# Patient Record
Sex: Female | Born: 1960
Health system: Southern US, Community
[De-identification: ages and names within clinical notes are randomized; demographics above are authoritative.]

## PROBLEM LIST (undated history)

## (undated) ENCOUNTER — Emergency Department (HOSPITAL_COMMUNITY): Admission: EM | Discharge status: Absent without leave | Payer: BC Managed Care – PPO

## (undated) DIAGNOSIS — K219 Gastro-esophageal reflux disease without esophagitis: Secondary | ICD-10-CM

## (undated) DIAGNOSIS — E539 Vitamin B deficiency, unspecified: Secondary | ICD-10-CM

## (undated) DIAGNOSIS — Z9289 Personal history of other medical treatment: Secondary | ICD-10-CM

## (undated) DIAGNOSIS — F3289 Other specified depressive episodes: Secondary | ICD-10-CM

## (undated) DIAGNOSIS — J301 Allergic rhinitis due to pollen: Secondary | ICD-10-CM

## (undated) DIAGNOSIS — R569 Unspecified convulsions: Secondary | ICD-10-CM

## (undated) DIAGNOSIS — IMO0001 Reserved for inherently not codable concepts without codable children: Secondary | ICD-10-CM

## (undated) DIAGNOSIS — M1990 Unspecified osteoarthritis, unspecified site: Secondary | ICD-10-CM

## (undated) DIAGNOSIS — K8020 Calculus of gallbladder without cholecystitis without obstruction: Secondary | ICD-10-CM

## (undated) DIAGNOSIS — J40 Bronchitis, not specified as acute or chronic: Secondary | ICD-10-CM

## (undated) DIAGNOSIS — R51 Headache: Secondary | ICD-10-CM

## (undated) DIAGNOSIS — J189 Pneumonia, unspecified organism: Secondary | ICD-10-CM

## (undated) DIAGNOSIS — I214 Non-ST elevation (NSTEMI) myocardial infarction: Secondary | ICD-10-CM

## (undated) DIAGNOSIS — R002 Palpitations: Secondary | ICD-10-CM

## (undated) DIAGNOSIS — K5090 Crohn's disease, unspecified, without complications: Secondary | ICD-10-CM

## (undated) DIAGNOSIS — I519 Heart disease, unspecified: Secondary | ICD-10-CM

## (undated) DIAGNOSIS — M797 Fibromyalgia: Secondary | ICD-10-CM

## (undated) DIAGNOSIS — F329 Major depressive disorder, single episode, unspecified: Secondary | ICD-10-CM

## (undated) DIAGNOSIS — E785 Hyperlipidemia, unspecified: Secondary | ICD-10-CM

## (undated) DIAGNOSIS — R519 Headache, unspecified: Secondary | ICD-10-CM

## (undated) DIAGNOSIS — T7840XA Allergy, unspecified, initial encounter: Secondary | ICD-10-CM

## (undated) DIAGNOSIS — I5189 Other ill-defined heart diseases: Secondary | ICD-10-CM

## (undated) DIAGNOSIS — I10 Essential (primary) hypertension: Secondary | ICD-10-CM

## (undated) DIAGNOSIS — Z95820 Peripheral vascular angioplasty status with implants and grafts: Secondary | ICD-10-CM

## (undated) DIAGNOSIS — F411 Generalized anxiety disorder: Secondary | ICD-10-CM

## (undated) DIAGNOSIS — I2510 Atherosclerotic heart disease of native coronary artery without angina pectoris: Secondary | ICD-10-CM

## (undated) DIAGNOSIS — E119 Type 2 diabetes mellitus without complications: Secondary | ICD-10-CM

## (undated) HISTORY — DX: Allergy, unspecified, initial encounter: T78.40XA

## (undated) HISTORY — PX: TONSILLECTOMY AND ADENOIDECTOMY: SUR1326

## (undated) HISTORY — DX: Atherosclerotic heart disease of native coronary artery without angina pectoris: I25.10

## (undated) HISTORY — DX: Hyperlipidemia, unspecified: E78.5

## (undated) HISTORY — DX: Major depressive disorder, single episode, unspecified: F32.9

## (undated) HISTORY — DX: Allergic rhinitis due to pollen: J30.1

## (undated) HISTORY — DX: Vitamin B deficiency, unspecified: E53.9

## (undated) HISTORY — DX: Gastro-esophageal reflux disease without esophagitis: K21.9

## (undated) HISTORY — DX: Essential (primary) hypertension: I10

## (undated) HISTORY — DX: Fibromyalgia: M79.7

## (undated) HISTORY — DX: Reserved for inherently not codable concepts without codable children: IMO0001

## (undated) HISTORY — DX: Type 2 diabetes mellitus without complications: E11.9

## (undated) HISTORY — PX: TUBAL LIGATION: SHX77

## (undated) HISTORY — PX: EXPLORATORY LAPAROTOMY: SUR591

## (undated) HISTORY — DX: Unspecified convulsions: R56.9

## (undated) HISTORY — PX: ESOPHAGOGASTRODUODENOSCOPY: SHX1529

## (undated) HISTORY — DX: Crohn's disease, unspecified, without complications: K50.90

## (undated) HISTORY — DX: Personal history of other medical treatment: Z92.89

## (undated) HISTORY — DX: Calculus of gallbladder without cholecystitis without obstruction: K80.20

## (undated) HISTORY — DX: Generalized anxiety disorder: F41.1

## (undated) HISTORY — DX: Palpitations: R00.2

## (undated) HISTORY — DX: Other specified depressive episodes: F32.89

---
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
## 1998-03-06 ENCOUNTER — Encounter: Payer: Self-pay | Admitting: Internal Medicine

## 1998-03-06 ENCOUNTER — Observation Stay (HOSPITAL_COMMUNITY): Admission: EM | Admit: 1998-03-06 | Discharge: 1998-03-07 | Payer: Self-pay | Admitting: Emergency Medicine

## 1999-01-27 ENCOUNTER — Ambulatory Visit (HOSPITAL_COMMUNITY): Admission: RE | Admit: 1999-01-27 | Discharge: 1999-01-27 | Payer: Self-pay | Admitting: Internal Medicine

## 1999-01-27 ENCOUNTER — Encounter: Payer: Self-pay | Admitting: Internal Medicine

## 1999-07-02 ENCOUNTER — Other Ambulatory Visit: Admission: RE | Admit: 1999-07-02 | Discharge: 1999-07-02 | Payer: Self-pay | Admitting: Internal Medicine

## 1999-07-02 ENCOUNTER — Encounter: Payer: Self-pay | Admitting: Internal Medicine

## 1999-08-27 ENCOUNTER — Other Ambulatory Visit: Admission: RE | Admit: 1999-08-27 | Discharge: 1999-08-27 | Payer: Self-pay | Admitting: Internal Medicine

## 1999-08-27 ENCOUNTER — Encounter (INDEPENDENT_AMBULATORY_CARE_PROVIDER_SITE_OTHER): Payer: Self-pay | Admitting: Specialist

## 1999-10-20 ENCOUNTER — Other Ambulatory Visit: Admission: RE | Admit: 1999-10-20 | Discharge: 1999-10-20 | Payer: Self-pay | Admitting: Obstetrics and Gynecology

## 1999-11-12 ENCOUNTER — Encounter (HOSPITAL_COMMUNITY): Admission: RE | Admit: 1999-11-12 | Discharge: 2000-02-10 | Payer: Self-pay | Admitting: Internal Medicine

## 1999-11-29 ENCOUNTER — Emergency Department (HOSPITAL_COMMUNITY): Admission: EM | Admit: 1999-11-29 | Discharge: 1999-11-29 | Payer: Self-pay | Admitting: *Deleted

## 2000-02-18 ENCOUNTER — Other Ambulatory Visit: Admission: RE | Admit: 2000-02-18 | Discharge: 2000-02-18 | Payer: Self-pay | Admitting: Obstetrics and Gynecology

## 2000-03-03 ENCOUNTER — Encounter (HOSPITAL_COMMUNITY): Admission: RE | Admit: 2000-03-03 | Discharge: 2000-06-01 | Payer: Self-pay | Admitting: Internal Medicine

## 2000-10-30 ENCOUNTER — Ambulatory Visit (HOSPITAL_BASED_OUTPATIENT_CLINIC_OR_DEPARTMENT_OTHER): Admission: RE | Admit: 2000-10-30 | Discharge: 2000-10-30 | Payer: Self-pay | Admitting: Family Medicine

## 2001-08-17 ENCOUNTER — Other Ambulatory Visit: Admission: RE | Admit: 2001-08-17 | Discharge: 2001-08-17 | Payer: Self-pay | Admitting: Obstetrics and Gynecology

## 2002-09-19 ENCOUNTER — Other Ambulatory Visit: Admission: RE | Admit: 2002-09-19 | Discharge: 2002-09-19 | Payer: Self-pay | Admitting: Obstetrics and Gynecology

## 2003-10-22 ENCOUNTER — Other Ambulatory Visit: Admission: RE | Admit: 2003-10-22 | Discharge: 2003-10-22 | Payer: Self-pay | Admitting: Obstetrics and Gynecology

## 2004-06-24 ENCOUNTER — Ambulatory Visit (HOSPITAL_COMMUNITY): Admission: RE | Admit: 2004-06-24 | Discharge: 2004-06-24 | Payer: Self-pay | Admitting: *Deleted

## 2004-07-02 ENCOUNTER — Ambulatory Visit: Payer: Self-pay

## 2004-07-23 ENCOUNTER — Ambulatory Visit: Payer: Self-pay | Admitting: Internal Medicine

## 2004-08-03 ENCOUNTER — Ambulatory Visit: Payer: Self-pay | Admitting: Internal Medicine

## 2004-08-14 ENCOUNTER — Ambulatory Visit: Payer: Self-pay | Admitting: Internal Medicine

## 2004-08-20 ENCOUNTER — Ambulatory Visit: Payer: Self-pay | Admitting: Internal Medicine

## 2004-08-25 ENCOUNTER — Ambulatory Visit: Payer: Self-pay | Admitting: Internal Medicine

## 2004-10-22 ENCOUNTER — Other Ambulatory Visit: Admission: RE | Admit: 2004-10-22 | Discharge: 2004-10-22 | Payer: Self-pay | Admitting: Obstetrics and Gynecology

## 2005-06-03 ENCOUNTER — Ambulatory Visit: Payer: Self-pay | Admitting: Internal Medicine

## 2005-08-02 ENCOUNTER — Ambulatory Visit: Payer: Self-pay | Admitting: Internal Medicine

## 2005-08-27 ENCOUNTER — Ambulatory Visit: Payer: Self-pay | Admitting: Internal Medicine

## 2005-08-30 ENCOUNTER — Ambulatory Visit: Payer: Self-pay | Admitting: Internal Medicine

## 2005-09-01 ENCOUNTER — Ambulatory Visit: Payer: Self-pay | Admitting: Internal Medicine

## 2005-09-28 ENCOUNTER — Ambulatory Visit: Payer: Self-pay | Admitting: Internal Medicine

## 2005-09-29 ENCOUNTER — Encounter: Payer: Self-pay | Admitting: Internal Medicine

## 2005-10-04 ENCOUNTER — Ambulatory Visit: Payer: Self-pay | Admitting: Internal Medicine

## 2005-10-07 ENCOUNTER — Ambulatory Visit: Payer: Self-pay | Admitting: Internal Medicine

## 2005-10-14 ENCOUNTER — Ambulatory Visit: Payer: Self-pay | Admitting: Internal Medicine

## 2005-10-18 ENCOUNTER — Ambulatory Visit: Payer: Self-pay | Admitting: Internal Medicine

## 2005-10-21 ENCOUNTER — Ambulatory Visit: Payer: Self-pay | Admitting: Internal Medicine

## 2005-10-22 ENCOUNTER — Ambulatory Visit: Payer: Self-pay | Admitting: Internal Medicine

## 2005-10-26 ENCOUNTER — Ambulatory Visit: Payer: Self-pay | Admitting: Internal Medicine

## 2005-10-28 ENCOUNTER — Ambulatory Visit: Payer: Self-pay | Admitting: Internal Medicine

## 2005-11-01 ENCOUNTER — Ambulatory Visit: Payer: Self-pay | Admitting: Internal Medicine

## 2005-11-02 ENCOUNTER — Ambulatory Visit: Payer: Self-pay | Admitting: Internal Medicine

## 2005-11-08 ENCOUNTER — Ambulatory Visit: Payer: Self-pay | Admitting: Internal Medicine

## 2005-11-11 ENCOUNTER — Ambulatory Visit: Payer: Self-pay | Admitting: Internal Medicine

## 2005-11-15 ENCOUNTER — Ambulatory Visit: Payer: Self-pay | Admitting: Internal Medicine

## 2005-11-18 ENCOUNTER — Ambulatory Visit: Payer: Self-pay | Admitting: Internal Medicine

## 2005-11-22 ENCOUNTER — Ambulatory Visit: Payer: Self-pay | Admitting: Internal Medicine

## 2005-11-25 ENCOUNTER — Ambulatory Visit: Payer: Self-pay | Admitting: Internal Medicine

## 2005-11-29 ENCOUNTER — Ambulatory Visit: Payer: Self-pay | Admitting: Internal Medicine

## 2005-12-02 ENCOUNTER — Ambulatory Visit: Payer: Self-pay | Admitting: Internal Medicine

## 2005-12-06 ENCOUNTER — Ambulatory Visit: Payer: Self-pay | Admitting: Internal Medicine

## 2005-12-08 ENCOUNTER — Ambulatory Visit: Payer: Self-pay | Admitting: Internal Medicine

## 2005-12-09 ENCOUNTER — Ambulatory Visit: Payer: Self-pay | Admitting: Internal Medicine

## 2005-12-13 ENCOUNTER — Ambulatory Visit: Payer: Self-pay | Admitting: Internal Medicine

## 2005-12-16 ENCOUNTER — Ambulatory Visit: Payer: Self-pay | Admitting: Internal Medicine

## 2005-12-20 ENCOUNTER — Ambulatory Visit: Payer: Self-pay | Admitting: Internal Medicine

## 2005-12-23 ENCOUNTER — Ambulatory Visit: Payer: Self-pay | Admitting: Internal Medicine

## 2005-12-27 ENCOUNTER — Ambulatory Visit: Payer: Self-pay | Admitting: Internal Medicine

## 2005-12-30 ENCOUNTER — Ambulatory Visit: Payer: Self-pay | Admitting: Internal Medicine

## 2006-01-03 ENCOUNTER — Ambulatory Visit: Payer: Self-pay | Admitting: Internal Medicine

## 2006-01-06 ENCOUNTER — Ambulatory Visit: Payer: Self-pay | Admitting: Internal Medicine

## 2006-01-10 ENCOUNTER — Ambulatory Visit: Payer: Self-pay | Admitting: Internal Medicine

## 2006-01-17 ENCOUNTER — Ambulatory Visit: Payer: Self-pay | Admitting: Internal Medicine

## 2006-01-20 ENCOUNTER — Ambulatory Visit: Payer: Self-pay | Admitting: Internal Medicine

## 2006-01-24 ENCOUNTER — Ambulatory Visit: Payer: Self-pay | Admitting: Internal Medicine

## 2006-02-03 ENCOUNTER — Ambulatory Visit: Payer: Self-pay | Admitting: Internal Medicine

## 2006-02-10 ENCOUNTER — Ambulatory Visit: Payer: Self-pay | Admitting: Internal Medicine

## 2006-02-18 ENCOUNTER — Ambulatory Visit: Payer: Self-pay | Admitting: Internal Medicine

## 2006-02-24 ENCOUNTER — Ambulatory Visit: Payer: Self-pay | Admitting: Internal Medicine

## 2006-03-03 ENCOUNTER — Ambulatory Visit: Payer: Self-pay | Admitting: Internal Medicine

## 2006-03-10 ENCOUNTER — Ambulatory Visit: Payer: Self-pay | Admitting: Internal Medicine

## 2006-03-17 ENCOUNTER — Ambulatory Visit: Payer: Self-pay | Admitting: Internal Medicine

## 2006-03-24 ENCOUNTER — Ambulatory Visit: Payer: Self-pay | Admitting: Internal Medicine

## 2006-04-01 ENCOUNTER — Ambulatory Visit: Payer: Self-pay | Admitting: Internal Medicine

## 2006-04-07 ENCOUNTER — Ambulatory Visit: Payer: Self-pay | Admitting: Internal Medicine

## 2006-04-14 ENCOUNTER — Ambulatory Visit: Payer: Self-pay | Admitting: Internal Medicine

## 2006-04-20 ENCOUNTER — Ambulatory Visit: Payer: Self-pay | Admitting: Pulmonary Disease

## 2006-04-20 ENCOUNTER — Ambulatory Visit: Payer: Self-pay | Admitting: Internal Medicine

## 2006-04-28 ENCOUNTER — Ambulatory Visit: Payer: Self-pay | Admitting: Internal Medicine

## 2006-04-29 ENCOUNTER — Ambulatory Visit: Payer: Self-pay | Admitting: Internal Medicine

## 2006-05-06 ENCOUNTER — Ambulatory Visit: Payer: Self-pay | Admitting: Internal Medicine

## 2006-05-12 ENCOUNTER — Ambulatory Visit: Payer: Self-pay | Admitting: Internal Medicine

## 2006-05-18 ENCOUNTER — Ambulatory Visit: Payer: Self-pay | Admitting: Internal Medicine

## 2006-06-02 ENCOUNTER — Ambulatory Visit: Payer: Self-pay | Admitting: Internal Medicine

## 2006-06-09 ENCOUNTER — Ambulatory Visit: Payer: Self-pay | Admitting: Internal Medicine

## 2006-06-17 ENCOUNTER — Ambulatory Visit: Payer: Self-pay | Admitting: Internal Medicine

## 2006-06-22 ENCOUNTER — Ambulatory Visit: Payer: Self-pay | Admitting: Internal Medicine

## 2006-06-27 ENCOUNTER — Ambulatory Visit: Payer: Self-pay | Admitting: Internal Medicine

## 2006-06-30 ENCOUNTER — Ambulatory Visit: Payer: Self-pay | Admitting: Internal Medicine

## 2006-07-07 ENCOUNTER — Ambulatory Visit: Payer: Self-pay | Admitting: Internal Medicine

## 2006-07-15 ENCOUNTER — Ambulatory Visit: Payer: Self-pay | Admitting: Internal Medicine

## 2006-07-21 ENCOUNTER — Ambulatory Visit: Payer: Self-pay | Admitting: Internal Medicine

## 2006-07-29 ENCOUNTER — Ambulatory Visit: Payer: Self-pay | Admitting: Internal Medicine

## 2006-08-05 ENCOUNTER — Ambulatory Visit: Payer: Self-pay | Admitting: Internal Medicine

## 2006-08-15 ENCOUNTER — Ambulatory Visit: Payer: Self-pay | Admitting: Internal Medicine

## 2006-08-22 ENCOUNTER — Ambulatory Visit: Payer: Self-pay | Admitting: Internal Medicine

## 2006-08-23 ENCOUNTER — Ambulatory Visit: Payer: Self-pay | Admitting: Internal Medicine

## 2006-08-29 ENCOUNTER — Ambulatory Visit: Payer: Self-pay | Admitting: Internal Medicine

## 2006-09-05 ENCOUNTER — Ambulatory Visit: Payer: Self-pay | Admitting: Internal Medicine

## 2006-09-12 ENCOUNTER — Ambulatory Visit: Payer: Self-pay | Admitting: Internal Medicine

## 2006-09-19 ENCOUNTER — Ambulatory Visit: Payer: Self-pay | Admitting: Internal Medicine

## 2006-09-27 ENCOUNTER — Ambulatory Visit: Payer: Self-pay | Admitting: Internal Medicine

## 2006-09-30 ENCOUNTER — Ambulatory Visit: Payer: Self-pay | Admitting: Internal Medicine

## 2006-10-04 ENCOUNTER — Ambulatory Visit: Payer: Self-pay | Admitting: Internal Medicine

## 2006-10-14 ENCOUNTER — Ambulatory Visit: Payer: Self-pay | Admitting: Internal Medicine

## 2006-10-19 ENCOUNTER — Ambulatory Visit: Payer: Self-pay | Admitting: Internal Medicine

## 2006-10-27 ENCOUNTER — Ambulatory Visit: Payer: Self-pay | Admitting: Internal Medicine

## 2006-11-02 ENCOUNTER — Ambulatory Visit: Payer: Self-pay | Admitting: Internal Medicine

## 2006-11-09 ENCOUNTER — Ambulatory Visit: Payer: Self-pay | Admitting: Internal Medicine

## 2006-11-17 ENCOUNTER — Ambulatory Visit: Payer: Self-pay | Admitting: Internal Medicine

## 2006-11-22 ENCOUNTER — Ambulatory Visit: Payer: Self-pay | Admitting: Internal Medicine

## 2006-12-09 ENCOUNTER — Ambulatory Visit: Payer: Self-pay | Admitting: Internal Medicine

## 2006-12-14 ENCOUNTER — Ambulatory Visit: Payer: Self-pay | Admitting: Internal Medicine

## 2006-12-22 ENCOUNTER — Ambulatory Visit: Payer: Self-pay | Admitting: Internal Medicine

## 2006-12-28 ENCOUNTER — Ambulatory Visit: Payer: Self-pay | Admitting: Internal Medicine

## 2006-12-30 ENCOUNTER — Ambulatory Visit: Payer: Self-pay | Admitting: Internal Medicine

## 2007-01-06 ENCOUNTER — Ambulatory Visit: Payer: Self-pay | Admitting: Internal Medicine

## 2007-01-11 ENCOUNTER — Ambulatory Visit: Payer: Self-pay | Admitting: Internal Medicine

## 2007-01-13 ENCOUNTER — Ambulatory Visit: Payer: Self-pay | Admitting: Internal Medicine

## 2007-01-19 ENCOUNTER — Ambulatory Visit: Payer: Self-pay | Admitting: Internal Medicine

## 2007-01-19 LAB — CONVERTED CEMR LAB: Triglycerides: 131 mg/dL (ref 0–149)

## 2007-01-26 ENCOUNTER — Ambulatory Visit: Payer: Self-pay | Admitting: Internal Medicine

## 2007-01-31 ENCOUNTER — Ambulatory Visit: Payer: Self-pay | Admitting: Internal Medicine

## 2007-02-08 ENCOUNTER — Ambulatory Visit: Payer: Self-pay | Admitting: Internal Medicine

## 2007-02-22 ENCOUNTER — Ambulatory Visit: Payer: Self-pay | Admitting: Internal Medicine

## 2007-03-01 ENCOUNTER — Ambulatory Visit: Payer: Self-pay | Admitting: Internal Medicine

## 2007-03-07 ENCOUNTER — Ambulatory Visit: Payer: Self-pay | Admitting: Internal Medicine

## 2007-03-15 ENCOUNTER — Ambulatory Visit: Payer: Self-pay | Admitting: Internal Medicine

## 2007-03-16 ENCOUNTER — Ambulatory Visit: Payer: Self-pay | Admitting: Internal Medicine

## 2007-03-16 ENCOUNTER — Encounter: Payer: Self-pay | Admitting: Internal Medicine

## 2007-03-16 DIAGNOSIS — K8020 Calculus of gallbladder without cholecystitis without obstruction: Secondary | ICD-10-CM | POA: Insufficient documentation

## 2007-03-16 DIAGNOSIS — IMO0001 Reserved for inherently not codable concepts without codable children: Secondary | ICD-10-CM | POA: Insufficient documentation

## 2007-03-16 DIAGNOSIS — J3089 Other allergic rhinitis: Secondary | ICD-10-CM

## 2007-03-16 DIAGNOSIS — K5090 Crohn's disease, unspecified, without complications: Secondary | ICD-10-CM | POA: Insufficient documentation

## 2007-03-16 DIAGNOSIS — R002 Palpitations: Secondary | ICD-10-CM | POA: Insufficient documentation

## 2007-03-16 DIAGNOSIS — F411 Generalized anxiety disorder: Secondary | ICD-10-CM | POA: Insufficient documentation

## 2007-03-16 DIAGNOSIS — F331 Major depressive disorder, recurrent, moderate: Secondary | ICD-10-CM | POA: Insufficient documentation

## 2007-03-16 DIAGNOSIS — J302 Other seasonal allergic rhinitis: Secondary | ICD-10-CM | POA: Insufficient documentation

## 2007-03-16 DIAGNOSIS — M461 Sacroiliitis, not elsewhere classified: Secondary | ICD-10-CM | POA: Insufficient documentation

## 2007-03-16 DIAGNOSIS — E785 Hyperlipidemia, unspecified: Secondary | ICD-10-CM | POA: Insufficient documentation

## 2007-03-16 DIAGNOSIS — K219 Gastro-esophageal reflux disease without esophagitis: Secondary | ICD-10-CM | POA: Insufficient documentation

## 2007-03-16 DIAGNOSIS — F329 Major depressive disorder, single episode, unspecified: Secondary | ICD-10-CM

## 2007-03-16 DIAGNOSIS — E1169 Type 2 diabetes mellitus with other specified complication: Secondary | ICD-10-CM | POA: Insufficient documentation

## 2007-03-23 ENCOUNTER — Ambulatory Visit: Payer: Self-pay | Admitting: Internal Medicine

## 2007-03-29 ENCOUNTER — Ambulatory Visit: Payer: Self-pay | Admitting: Internal Medicine

## 2007-04-07 ENCOUNTER — Ambulatory Visit: Payer: Self-pay | Admitting: Internal Medicine

## 2007-04-14 ENCOUNTER — Ambulatory Visit: Payer: Self-pay | Admitting: Internal Medicine

## 2007-04-21 ENCOUNTER — Ambulatory Visit: Payer: Self-pay | Admitting: Internal Medicine

## 2007-05-02 ENCOUNTER — Ambulatory Visit: Payer: Self-pay | Admitting: Internal Medicine

## 2007-05-09 ENCOUNTER — Ambulatory Visit: Payer: Self-pay | Admitting: Internal Medicine

## 2007-05-17 ENCOUNTER — Ambulatory Visit: Payer: Self-pay | Admitting: Internal Medicine

## 2007-05-18 ENCOUNTER — Ambulatory Visit: Payer: Self-pay | Admitting: Internal Medicine

## 2007-05-30 ENCOUNTER — Ambulatory Visit: Payer: Self-pay | Admitting: Internal Medicine

## 2007-06-02 ENCOUNTER — Encounter: Payer: Self-pay | Admitting: Internal Medicine

## 2007-06-12 ENCOUNTER — Ambulatory Visit: Payer: Self-pay | Admitting: Internal Medicine

## 2007-06-23 ENCOUNTER — Ambulatory Visit: Payer: Self-pay | Admitting: Internal Medicine

## 2007-06-30 ENCOUNTER — Ambulatory Visit: Payer: Self-pay | Admitting: Internal Medicine

## 2007-07-07 ENCOUNTER — Ambulatory Visit: Payer: Self-pay | Admitting: Internal Medicine

## 2007-07-14 ENCOUNTER — Ambulatory Visit: Payer: Self-pay | Admitting: Internal Medicine

## 2007-07-21 ENCOUNTER — Ambulatory Visit: Payer: Self-pay | Admitting: Internal Medicine

## 2007-07-25 ENCOUNTER — Ambulatory Visit: Payer: Self-pay | Admitting: Internal Medicine

## 2007-07-25 LAB — CONVERTED CEMR LAB
Basophils Absolute: 0 10*3/uL (ref 0.0–0.1)
Basophils Relative: 0.5 % (ref 0.0–1.0)
Hemoglobin: 14.5 g/dL (ref 12.0–15.0)
MCHC: 33.9 g/dL (ref 30.0–36.0)
Monocytes Relative: 5.5 % (ref 3.0–11.0)
Platelets: 374 10*3/uL (ref 150–400)
RBC: 4.52 M/uL (ref 3.87–5.11)
RDW: 12.5 % (ref 11.5–14.6)
WBC: 7.5 10*3/uL (ref 4.5–10.5)

## 2007-08-07 ENCOUNTER — Ambulatory Visit: Payer: Self-pay | Admitting: Internal Medicine

## 2007-08-15 ENCOUNTER — Ambulatory Visit: Payer: Self-pay | Admitting: Internal Medicine

## 2007-08-23 ENCOUNTER — Ambulatory Visit: Payer: Self-pay | Admitting: Internal Medicine

## 2007-09-04 ENCOUNTER — Ambulatory Visit: Payer: Self-pay | Admitting: Internal Medicine

## 2007-09-04 LAB — CONVERTED CEMR LAB
ALT: 22 units/L (ref 0–35)
AST: 21 units/L (ref 0–37)
Alkaline Phosphatase: 50 units/L (ref 39–117)
Amylase: 74 units/L (ref 27–131)
Bilirubin, Direct: 0.1 mg/dL (ref 0.0–0.3)
Total Bilirubin: 0.8 mg/dL (ref 0.3–1.2)

## 2007-09-06 ENCOUNTER — Encounter: Payer: Self-pay | Admitting: Internal Medicine

## 2007-09-06 ENCOUNTER — Ambulatory Visit (HOSPITAL_COMMUNITY): Admission: RE | Admit: 2007-09-06 | Discharge: 2007-09-06 | Payer: Self-pay | Admitting: Internal Medicine

## 2007-09-07 ENCOUNTER — Ambulatory Visit (HOSPITAL_COMMUNITY): Admission: RE | Admit: 2007-09-07 | Discharge: 2007-09-07 | Payer: Self-pay | Admitting: Internal Medicine

## 2007-09-07 ENCOUNTER — Ambulatory Visit: Payer: Self-pay | Admitting: Internal Medicine

## 2007-09-18 ENCOUNTER — Ambulatory Visit: Payer: Self-pay | Admitting: Internal Medicine

## 2007-09-28 ENCOUNTER — Ambulatory Visit: Payer: Self-pay | Admitting: Internal Medicine

## 2007-10-03 ENCOUNTER — Ambulatory Visit: Payer: Self-pay | Admitting: Internal Medicine

## 2007-10-11 ENCOUNTER — Ambulatory Visit: Payer: Self-pay | Admitting: Internal Medicine

## 2007-10-27 ENCOUNTER — Ambulatory Visit: Payer: Self-pay | Admitting: Internal Medicine

## 2007-11-09 ENCOUNTER — Ambulatory Visit: Payer: Self-pay | Admitting: Internal Medicine

## 2007-11-22 ENCOUNTER — Ambulatory Visit: Payer: Self-pay | Admitting: Internal Medicine

## 2007-11-24 ENCOUNTER — Ambulatory Visit: Payer: Self-pay | Admitting: Internal Medicine

## 2007-12-04 ENCOUNTER — Encounter: Payer: Self-pay | Admitting: Internal Medicine

## 2007-12-06 ENCOUNTER — Ambulatory Visit: Payer: Self-pay | Admitting: Internal Medicine

## 2007-12-08 ENCOUNTER — Telehealth: Payer: Self-pay | Admitting: Internal Medicine

## 2007-12-20 ENCOUNTER — Telehealth: Payer: Self-pay | Admitting: Internal Medicine

## 2007-12-25 ENCOUNTER — Ambulatory Visit: Payer: Self-pay | Admitting: Internal Medicine

## 2008-01-05 ENCOUNTER — Ambulatory Visit: Payer: Self-pay | Admitting: Internal Medicine

## 2008-01-12 ENCOUNTER — Ambulatory Visit: Payer: Self-pay | Admitting: Internal Medicine

## 2008-01-22 ENCOUNTER — Ambulatory Visit: Payer: Self-pay | Admitting: Internal Medicine

## 2008-01-22 LAB — CONVERTED CEMR LAB
Basophils Absolute: 0.1 10*3/uL (ref 0.0–0.1)
Basophils Relative: 0.5 % (ref 0.0–3.0)
HCT: 41.5 % (ref 36.0–46.0)
Hemoglobin: 14.4 g/dL (ref 12.0–15.0)
Lymphocytes Relative: 18.1 % (ref 12.0–46.0)
MCHC: 34.7 g/dL (ref 30.0–36.0)
MCV: 94.8 fL (ref 78.0–100.0)
Monocytes Absolute: 0.5 10*3/uL (ref 0.1–1.0)
Neutro Abs: 7.7 10*3/uL (ref 1.4–7.7)
RBC: 4.37 M/uL (ref 3.87–5.11)
RDW: 13 % (ref 11.5–14.6)

## 2008-01-31 ENCOUNTER — Ambulatory Visit: Payer: Self-pay | Admitting: Internal Medicine

## 2008-02-29 ENCOUNTER — Telehealth: Payer: Self-pay | Admitting: Internal Medicine

## 2008-03-20 ENCOUNTER — Ambulatory Visit: Payer: Self-pay | Admitting: Internal Medicine

## 2008-03-28 ENCOUNTER — Ambulatory Visit: Payer: Self-pay | Admitting: Internal Medicine

## 2008-03-28 ENCOUNTER — Telehealth: Payer: Self-pay | Admitting: Internal Medicine

## 2008-05-21 ENCOUNTER — Ambulatory Visit: Payer: Self-pay | Admitting: Internal Medicine

## 2008-05-29 ENCOUNTER — Ambulatory Visit: Payer: Self-pay | Admitting: Internal Medicine

## 2008-06-05 ENCOUNTER — Ambulatory Visit: Payer: Self-pay | Admitting: Internal Medicine

## 2008-06-14 ENCOUNTER — Ambulatory Visit: Payer: Self-pay | Admitting: Internal Medicine

## 2008-06-26 ENCOUNTER — Ambulatory Visit: Payer: Self-pay | Admitting: Internal Medicine

## 2008-07-11 ENCOUNTER — Ambulatory Visit: Payer: Self-pay | Admitting: Internal Medicine

## 2008-07-19 ENCOUNTER — Ambulatory Visit: Payer: Self-pay | Admitting: Internal Medicine

## 2008-07-31 ENCOUNTER — Ambulatory Visit: Payer: Self-pay | Admitting: Internal Medicine

## 2008-08-09 ENCOUNTER — Ambulatory Visit: Payer: Self-pay | Admitting: Internal Medicine

## 2008-08-13 ENCOUNTER — Ambulatory Visit: Payer: Self-pay | Admitting: Internal Medicine

## 2008-08-14 ENCOUNTER — Ambulatory Visit: Payer: Self-pay | Admitting: Internal Medicine

## 2008-08-27 ENCOUNTER — Ambulatory Visit: Payer: Self-pay | Admitting: Internal Medicine

## 2008-09-04 ENCOUNTER — Ambulatory Visit: Payer: Self-pay | Admitting: Internal Medicine

## 2008-09-16 ENCOUNTER — Ambulatory Visit: Payer: Self-pay | Admitting: Internal Medicine

## 2008-09-25 ENCOUNTER — Ambulatory Visit: Payer: Self-pay | Admitting: Internal Medicine

## 2008-10-11 ENCOUNTER — Ambulatory Visit: Payer: Self-pay | Admitting: Internal Medicine

## 2008-10-22 ENCOUNTER — Ambulatory Visit: Payer: Self-pay | Admitting: Internal Medicine

## 2008-11-06 ENCOUNTER — Encounter: Payer: Self-pay | Admitting: Internal Medicine

## 2008-12-05 ENCOUNTER — Ambulatory Visit: Payer: Self-pay | Admitting: Internal Medicine

## 2008-12-17 ENCOUNTER — Ambulatory Visit: Payer: Self-pay | Admitting: Internal Medicine

## 2008-12-23 ENCOUNTER — Ambulatory Visit: Payer: Self-pay | Admitting: Internal Medicine

## 2008-12-23 LAB — CONVERTED CEMR LAB
ALT: 21 units/L (ref 0–35)
Alkaline Phosphatase: 52 units/L (ref 39–117)
Basophils Absolute: 0 10*3/uL (ref 0.0–0.1)
HCT: 40.5 % (ref 36.0–46.0)
Lymphs Abs: 1.5 10*3/uL (ref 0.7–4.0)
MCV: 94.2 fL (ref 78.0–100.0)
Monocytes Absolute: 0.4 10*3/uL (ref 0.1–1.0)
Monocytes Relative: 4.9 % (ref 3.0–12.0)
Neutrophils Relative %: 75 % (ref 43.0–77.0)
Platelets: 355 10*3/uL (ref 150.0–400.0)
Potassium: 3.8 meq/L (ref 3.5–5.1)
RDW: 13 % (ref 11.5–14.6)
Sodium: 140 meq/L (ref 135–145)
Total Bilirubin: 0.8 mg/dL (ref 0.3–1.2)
Total Protein: 6.8 g/dL (ref 6.0–8.3)

## 2008-12-24 ENCOUNTER — Ambulatory Visit: Payer: Self-pay | Admitting: Internal Medicine

## 2008-12-25 ENCOUNTER — Ambulatory Visit: Payer: Self-pay | Admitting: Internal Medicine

## 2008-12-26 ENCOUNTER — Ambulatory Visit: Payer: Self-pay | Admitting: Internal Medicine

## 2009-01-06 ENCOUNTER — Ambulatory Visit: Payer: Self-pay | Admitting: Internal Medicine

## 2009-01-06 DIAGNOSIS — E538 Deficiency of other specified B group vitamins: Secondary | ICD-10-CM | POA: Insufficient documentation

## 2009-01-13 ENCOUNTER — Ambulatory Visit: Payer: Self-pay | Admitting: Internal Medicine

## 2009-01-22 ENCOUNTER — Ambulatory Visit: Payer: Self-pay | Admitting: Internal Medicine

## 2009-02-13 ENCOUNTER — Ambulatory Visit: Payer: Self-pay | Admitting: Internal Medicine

## 2009-03-17 ENCOUNTER — Ambulatory Visit: Payer: Self-pay | Admitting: Internal Medicine

## 2009-03-25 ENCOUNTER — Ambulatory Visit: Payer: Self-pay | Admitting: Internal Medicine

## 2009-04-14 ENCOUNTER — Ambulatory Visit: Payer: Self-pay | Admitting: Internal Medicine

## 2009-04-21 ENCOUNTER — Telehealth: Payer: Self-pay | Admitting: Internal Medicine

## 2009-05-14 ENCOUNTER — Ambulatory Visit: Payer: Self-pay | Admitting: Internal Medicine

## 2009-06-03 ENCOUNTER — Telehealth: Payer: Self-pay | Admitting: Internal Medicine

## 2009-06-13 ENCOUNTER — Ambulatory Visit: Payer: Self-pay | Admitting: Internal Medicine

## 2009-06-24 ENCOUNTER — Telehealth: Payer: Self-pay | Admitting: Internal Medicine

## 2009-07-06 ENCOUNTER — Encounter: Payer: Self-pay | Admitting: Cardiology

## 2009-07-09 ENCOUNTER — Ambulatory Visit: Payer: Self-pay | Admitting: Internal Medicine

## 2009-07-10 LAB — CONVERTED CEMR LAB
Basophils Relative: 0.1 % (ref 0.0–3.0)
Eosinophils Absolute: 0.1 10*3/uL (ref 0.0–0.7)
HCT: 43.9 % (ref 36.0–46.0)
Hemoglobin: 14.5 g/dL (ref 12.0–15.0)
Lymphs Abs: 1.5 10*3/uL (ref 0.7–4.0)
MCHC: 33 g/dL (ref 30.0–36.0)
MCV: 95.4 fL (ref 78.0–100.0)
Monocytes Absolute: 0.5 10*3/uL (ref 0.1–1.0)
Neutro Abs: 6.2 10*3/uL (ref 1.4–7.7)
RBC: 4.6 M/uL (ref 3.87–5.11)

## 2009-07-16 ENCOUNTER — Encounter: Payer: Self-pay | Admitting: Cardiology

## 2009-08-01 ENCOUNTER — Ambulatory Visit: Payer: Self-pay | Admitting: Cardiology

## 2009-08-01 DIAGNOSIS — R079 Chest pain, unspecified: Secondary | ICD-10-CM | POA: Insufficient documentation

## 2009-08-04 ENCOUNTER — Telehealth: Payer: Self-pay | Admitting: Internal Medicine

## 2009-08-06 ENCOUNTER — Telehealth (INDEPENDENT_AMBULATORY_CARE_PROVIDER_SITE_OTHER): Payer: Self-pay | Admitting: *Deleted

## 2009-08-07 ENCOUNTER — Ambulatory Visit: Payer: Self-pay | Admitting: Cardiovascular Disease

## 2009-08-07 ENCOUNTER — Telehealth: Payer: Self-pay | Admitting: Cardiology

## 2009-08-07 ENCOUNTER — Ambulatory Visit: Payer: Self-pay

## 2009-08-07 ENCOUNTER — Ambulatory Visit: Payer: Self-pay | Admitting: Cardiology

## 2009-08-07 ENCOUNTER — Encounter (HOSPITAL_COMMUNITY): Admission: RE | Admit: 2009-08-07 | Discharge: 2009-09-30 | Payer: Self-pay | Admitting: Cardiology

## 2009-08-12 ENCOUNTER — Ambulatory Visit: Payer: Self-pay | Admitting: Internal Medicine

## 2009-08-12 ENCOUNTER — Telehealth: Payer: Self-pay | Admitting: Cardiology

## 2009-08-13 ENCOUNTER — Telehealth: Payer: Self-pay | Admitting: Internal Medicine

## 2009-08-13 ENCOUNTER — Encounter: Payer: Self-pay | Admitting: Internal Medicine

## 2009-08-14 ENCOUNTER — Encounter: Payer: Self-pay | Admitting: Cardiology

## 2009-08-18 ENCOUNTER — Ambulatory Visit: Payer: Self-pay | Admitting: Cardiology

## 2009-08-18 DIAGNOSIS — I10 Essential (primary) hypertension: Secondary | ICD-10-CM | POA: Insufficient documentation

## 2009-08-20 LAB — CONVERTED CEMR LAB
BUN: 15 mg/dL (ref 6–23)
Calcium: 9 mg/dL (ref 8.4–10.5)
GFR calc non Af Amer: 93.7 mL/min (ref >60)
Glucose, Bld: 172 mg/dL — ABNORMAL HIGH (ref 70–99)
Potassium: 3.7 meq/L (ref 3.5–5.1)

## 2009-08-21 ENCOUNTER — Telehealth: Payer: Self-pay | Admitting: Internal Medicine

## 2009-08-22 ENCOUNTER — Ambulatory Visit: Payer: Self-pay | Admitting: Internal Medicine

## 2009-08-25 ENCOUNTER — Encounter: Payer: Self-pay | Admitting: Internal Medicine

## 2009-08-26 ENCOUNTER — Encounter: Payer: Self-pay | Admitting: Internal Medicine

## 2010-01-05 ENCOUNTER — Telehealth: Payer: Self-pay | Admitting: Internal Medicine

## 2010-02-12 ENCOUNTER — Encounter: Payer: Self-pay | Admitting: Internal Medicine

## 2010-02-19 ENCOUNTER — Ambulatory Visit: Payer: Self-pay | Admitting: Internal Medicine

## 2010-02-25 DIAGNOSIS — E119 Type 2 diabetes mellitus without complications: Secondary | ICD-10-CM | POA: Insufficient documentation

## 2010-05-01 ENCOUNTER — Encounter: Payer: Self-pay | Admitting: Internal Medicine

## 2010-06-15 ENCOUNTER — Other Ambulatory Visit: Payer: Self-pay | Admitting: Internal Medicine

## 2010-06-15 ENCOUNTER — Ambulatory Visit
Admission: RE | Admit: 2010-06-15 | Discharge: 2010-06-15 | Payer: Self-pay | Source: Home / Self Care | Attending: Internal Medicine | Admitting: Internal Medicine

## 2010-06-15 LAB — CBC WITH DIFFERENTIAL/PLATELET
Basophils Absolute: 0.1 10*3/uL (ref 0.0–0.1)
Basophils Relative: 0.6 % (ref 0.0–3.0)
Eosinophils Absolute: 0.1 10*3/uL (ref 0.0–0.7)
Eosinophils Relative: 1 % (ref 0.0–5.0)
HCT: 40.6 % (ref 36.0–46.0)
Hemoglobin: 13.9 g/dL (ref 12.0–15.0)
Lymphocytes Relative: 21.5 % (ref 12.0–46.0)
Lymphs Abs: 1.9 10*3/uL (ref 0.7–4.0)
MCHC: 34.3 g/dL (ref 30.0–36.0)
MCV: 92.8 fl (ref 78.0–100.0)
Monocytes Absolute: 0.6 10*3/uL (ref 0.1–1.0)
Monocytes Relative: 7.4 % (ref 3.0–12.0)
Neutro Abs: 6.1 10*3/uL (ref 1.4–7.7)
Neutrophils Relative %: 69.5 % (ref 43.0–77.0)
Platelets: 320 10*3/uL (ref 150.0–400.0)
RBC: 4.37 Mil/uL (ref 3.87–5.11)
RDW: 13.7 % (ref 11.5–14.6)
WBC: 8.7 10*3/uL (ref 4.5–10.5)

## 2010-06-15 LAB — COMPREHENSIVE METABOLIC PANEL
ALT: 31 U/L (ref 0–35)
AST: 19 U/L (ref 0–37)
Albumin: 4 g/dL (ref 3.5–5.2)
Alkaline Phosphatase: 89 U/L (ref 39–117)
BUN: 17 mg/dL (ref 6–23)
CO2: 32 mEq/L (ref 19–32)
Calcium: 9.2 mg/dL (ref 8.4–10.5)
Chloride: 98 mEq/L (ref 96–112)
Creatinine, Ser: 0.6 mg/dL (ref 0.4–1.2)
GFR: 113.76 mL/min (ref >60.00)
Glucose, Bld: 107 mg/dL — ABNORMAL HIGH (ref 70–99)
Potassium: 3.9 mEq/L (ref 3.5–5.1)
Sodium: 137 mEq/L (ref 135–145)
Total Bilirubin: 0.4 mg/dL (ref 0.3–1.2)
Total Protein: 6.6 g/dL (ref 6.0–8.3)

## 2010-06-15 LAB — VITAMIN B12: Vitamin B-12: 309 pg/mL (ref 211–911)

## 2010-06-15 LAB — FERRITIN: Ferritin: 61.5 ng/mL (ref 10.0–291.0)

## 2010-06-15 LAB — FOLATE: Folate: 18.8 ng/mL (ref >5.9)

## 2010-06-15 LAB — IRON: Iron: 89 ug/dL (ref 42–145)

## 2010-06-15 LAB — IBC PANEL
Iron: 89 ug/dL (ref 42–145)
Saturation Ratios: 24.4 % (ref 20.0–50.0)
Transferrin: 260.7 mg/dL (ref 212.0–360.0)

## 2010-06-18 ENCOUNTER — Ambulatory Visit: Payer: Self-pay | Admitting: Internal Medicine

## 2010-06-23 ENCOUNTER — Ambulatory Visit
Admission: RE | Admit: 2010-06-23 | Discharge: 2010-06-23 | Payer: Self-pay | Source: Home / Self Care | Attending: Internal Medicine | Admitting: Internal Medicine

## 2010-07-02 NOTE — Assessment & Plan Note (Signed)
Summary: rov/apc   Copy to:  n/a Primary Provider/Referring Provider:  Dr. Janett Billow Copland  CC:  follow up visit-wants to get allergy shots at work..  History of Present Illness:  01/31/07- HISTORY:  Was already scheduled for a visit.  Last night had rather sudden onset of nasal congestion after moving a washer and dryer, which stirred up a lot of dust as she worked behind them.  Now nasal congestion, beginning to hurt when she sneezes.  No discharge yet.  No fever.  Chest feels okay.   07/1908- Allergic rhinitis,  Persistent nasal stuffiness. Took antibiotic for a sinusitis this winter. Perennial post nasal drip. Keeps rooms cool to avoid dry heat. Uses nasal saline and mucinex. Nasonex in stretches. Does bulk nasal saline rinse. Wears mask to dust. 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  August 12, 2009- Allergic rhinitis Noticing seasonal postnasal drainage and husband says she is snoring without apnea. She is using fluticasone once daily. Can't use decongestants due to her blood pressure.  Finances-she had to stop her vaccine. She asks if the NP at work could give her allergy shots. She says shots help and never had reaction. Had been off x 2 monhths. Denies headache, wheeze.     Current Medications (verified): 1)  Mercaptopurine 50 Mg  Tabs (Mercaptopurine) .... Take 1 Tablet By Mouth Once A Day. 2)  Estradiol   Powd (Estradiol) 3)  Norethindrone Acetate 5 Mg  Tabs (Norethindrone Acetate) .Marland Kitchen.. 1 By Mouth Qd 4)  Allergy Vaccine 1:10 Gh .... Per Schedule 5)  Nexium 40 Mg  Cpdr (Esomeprazole Magnesium) .... Take 1 Tablet By Mouth Once Daily 6)  Zoloft 50 Mg Tabs (Sertraline Hcl) .... Take 1 By Mouth Once Daily 7)  Pravastatin Sodium 40 Mg Tabs (Pravastatin Sodium) .... Take One Tablet in The Evening 8)  Chlorthalidone 25 Mg Tabs (Chlorthalidone) .... Take One Half Tablet By Mouth Once Daily 9)  Potassium Chloride 20 Meq Pack (Potassium Chloride) .... Take One Tablet By Mouth  Once Daily 10)  Ambien 10 Mg Tabs (Zolpidem Tartrate) .... Take 1 By Mouth At Bedtime As Needed 11)  Ibuprofen 600 Mg Tabs (Ibuprofen) .... Take 1 By Mouth Three Times A Day As Needed 12)  Flonase 50 Mcg/act Susp (Fluticasone Propionate) .... As Needed  Allergies (verified): 1)  ! * Remicade  Past History:  Past Medical History: Last updated: 2009/08/30 1. B12 DEFICIENCY (ICD-266.2) 2. ANXIETY (ICD-300.00) 3. PALPITATIONS (ICD-785.1) 4. DEPRESSION (ICD-311) 5. HYPERLIPIDEMIA (ICD-272.4) 6. GERD (ICD-530.81) 7. CHOLELITHIASIS W/O CHOLECYSTITIS W/O OBST (ICD-574.20) 8. CROHN'S DISEASE, HX OF (ICD-V12.70): Followed by Dr. Olevia Perches 9. FIBROMYALGIA (ICD-729.1) 10. ALLERGIC RHINITIS (ICD-477.9) 11. Prior stress test > 5 years ago was normal per patient's report 12. Chronic low back pain    Past Surgical History: Last updated: 07/09/2009 Tonsillectomy tubal ligation Lapascopic exploratory surgery OBGYN  Family History: Last updated: 2009/08/30 Mother died with MI at age 56. Father with CVA and a heart valve replacement.  Family History High cholesterol Family History Hypertension Family History of Stroke F 1st degree relative <60 gma with lung cancer Family History of Heart Disease: Father No FH of Colon Cancer:  Social History: Last updated: 08/30/09 Former Smoker- age 64 to 30 Alcohol use-no Illicit Drug Use - no Patient does not get regular exercise.  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    Vital Signs:  Patient profile:   50 year old female Height:      63  inches Weight:      144.13 pounds BMI:     25.62 O2 Sat:      96 % on Room air Pulse rate:   75 / minute BP sitting:   122 / 80  (left arm) Cuff size:   regular  Vitals Entered By: Clayborne Dana CMA (August 12, 2009 3:28 PM)  O2 Flow:  Room air  Physical Exam  Additional Exam:  General: A/Ox3; pleasant and cooperative, NAD, SKIN: no rash, lesions NODES: no lymphadenopathy HEENT: [place]/AT, EOM- WNL, Conjuctivae- clear, PERRLA, TM-WNL, Nose- turbinate edema, persistent sniffing., Throat- clear and wnl , No visible post nasal drip Mallampati  II NECK: Supple w/ fair ROM, JVD- none, normal carotid impulses w/o bruits Thyroid- normal to palpation CHEST: Clear to P&A HEART: RRR, no m/g/r heard ABDOMEN: Soft and nl;  HYQ:MVHQ, nl pulses, no edema  NEURO: Grossly intact to observation       Impression & Recommendations:  Problem # 1:  ALLERGIC RHINITIS (ICD-477.9)  Much sniffing and snoriting. She is convinced she is better on allergy vaccine. We will be able to let her FNP at work give her shots, with Epipen available.Will try sample Patanase nasal spray. Her updated medication list for this problem includes:    Flonase 50 Mcg/act Susp (Fluticasone propionate) .Marland Kitchen... As needed  Medications Added to Medication List This Visit: 1)  Ambien 10 Mg Tabs (Zolpidem tartrate) .... Take 1 by mouth at bedtime as needed 2)  Ibuprofen 600 Mg Tabs (Ibuprofen) .... Take 1 by mouth three times a day as needed 3)  Flonase 50 Mcg/act Susp (Fluticasone propionate) .... As needed 4)  Epipen 0.3 Mg/0.69m Devi (Epinephrine) .... For severe allergic reaction  Other Orders: Est. Patient Level II ((46962  Patient Instructions: 1)  Please schedule a follow-up appointment in 1 year. 2)  Continue fluticasone nose spray: 1-2 puffs each nostril  daily 3)  Try sample Patanase nasal spray; 1-2 puffs each nostril up to twice daily as needed 4)  We will have the allergy lab speak to CShelda Jakes FNP about  getting allergy shots at work. Prescriptions: EPIPEN 0.3 MG/0.3ML DEVI (EPINEPHRINE) For severe allergic reaction  #1 x prn   Entered and Authorized by:   CDeneise LeverMD   Signed by:   CDeneise LeverMD on 08/12/2009   Method used:   Print then Give to Patient   RxID:   [id number]

## 2010-07-02 NOTE — Progress Notes (Signed)
Summary: TRIAGE  Phone Note Call from Patient Call back at [phone]   Caller: Patient Call For: Dr.Delvon Chipps Summary of Call: Pt called to verify she is due for colon in 07/2009.  Appt not scheduled as March schedule not available.  Also has a ? for nurse. Initial call taken by: Abelino Derrick CMA Deborra Medina),  June 03, 2009 10:27 AM  Follow-up for Phone Call        Pt. has Crohn's and wants to know if it is safe to take [place] with her current meds?  DR.Coty Student PLEASE ADVISE  Follow-up by: Vivia Ewing LPN,  June 03, 9177 10:34 AM  Additional Follow-up for Phone Call Additional follow up Details #1::        No contraindication to taking the above product. Additional Follow-up by: Lafayette Dragon MD,  June 03, 2009 12:58 PM    Additional Follow-up for Phone Call Additional follow up Details #2::    Above MD orders reviewed with patient. Pt. instructed to call back as needed.

## 2010-07-02 NOTE — Miscellaneous (Signed)
Summary: Injection schedule  Injection schedule   Imported By: Bubba Hales 02/19/2010 09:26:55  _____________________________________________________________________  External Attachment:    Type:   Image     Comment:   External Document

## 2010-07-02 NOTE — Miscellaneous (Signed)
Summary: Injection Record/[facility]  Injection Record/[facility]   Imported By: Phillis Knack 11/20/2009 08:22:01  _____________________________________________________________________  External Attachment:    Type:   Image     Comment:   External Document

## 2010-07-02 NOTE — Miscellaneous (Signed)
Summary: Monthly B12 #1  Clinical Lists Changes  Orders: Added new Service order of Vit B12 1000 mcg (Y4034) - Signed      Medication Administration  Injection # 1:    Medication: Vit B12 1000 mcg    Diagnosis: Hx of B12 DEFICIENCY (ICD-266.2)    Route: IM    Site: L deltoid    Exp Date: 02/29/2012    Lot #: [id number]    Mfr: [place]    Patient tolerated injection without complications    Given by: Rosanne Sack RN (June 23, 2010 4:36 PM)  Orders Added: 1)  Vit B12 1000 mcg [V4259]

## 2010-07-02 NOTE — Assessment & Plan Note (Signed)
Summary: B12 5 OF 6. PT ALREADY [place]. FOR NEXT B12  Nurse Visit   Allergies: No Known Drug Allergies  Medication Administration  Injection # 1:    Medication: Vit B12 1000 mcg    Diagnosis: Hx of B12 DEFICIENCY (ICD-266.2)    Route: IM    Site: R deltoid    Exp Date: 12/2010    Lot #: [id number]    Mfr: [employer] [place]    Comments: pt is scheduled for sixth injection     Patient tolerated injection without complications    Given by: Hope Pigeon CMA (June 13, 2009 3:06 PM)  Orders Added: 1)  Vit B12 1000 mcg [S4383]

## 2010-07-02 NOTE — Assessment & Plan Note (Signed)
Summary: F/U CROHNS..JJ.    History of Present Illness Visit Type: Follow-up Visit Primary GI MD: [name] MD Primary Provider: Lamar Blinks, MD Requesting Provider: n/a Chief Complaint: Patient here for crohns follow up. She states she seen an llittle BRB in her stool at the end of november but none since. She does have a smal lamount of abdominal pain but not bad.  History of Present Illness:   This is a 50 year old white female with Crohn's disease of the terminal ileum since 1991. She has been on 6 MP and has done well over the past several years. Her last office visit was in February 2011. She has a history of sacroiliitis and B12 deficiency. She is currently not on B12 supplements. There is a history of gastroesophageal reflux. Her last upper endoscopy in March 2011 showed esophagitis. Her symptoms have been controlled with Nexium 40 mg daily. Her last colonoscopy in March 2011 showed mild narrowing of the ileocecal valve. There were pseudopolyps in the distal 8 cm of the terminal ileum. She is doing well working full time. Her upper abdominal ultrasound in 2009 was normal. She denies rectal bleeding, diarrhea or constipation. She has developed lactose intolerance.   GI Review of Systems      Denies abdominal pain, acid reflux, belching, bloating, chest pain, dysphagia with liquids, dysphagia with solids, heartburn, loss of appetite, nausea, vomiting, vomiting blood, weight loss, and  weight gain.        Denies anal fissure, black tarry stools, change in bowel habit, constipation, diarrhea, diverticulosis, fecal incontinence, heme positive stool, hemorrhoids, irritable bowel syndrome, jaundice, light color stool, liver problems, rectal bleeding, and  rectal pain.    Current Medications (verified): 1)  Mercaptopurine 50 Mg  Tabs (Mercaptopurine) .... Take 1 Tablet By Mouth Once A Day. Need Office Visit For Further Refills! 2)  Allergy Vaccine 1:10 At Work By F-Np .... Per  Schedule 3)  Nexium 40 Mg  Cpdr (Esomeprazole Magnesium) .... Take 1 Tablet By Mouth Once Daily 4)  Zoloft 50 Mg Tabs (Sertraline Hcl) .... Take 1 By Mouth Once Daily 5)  Pravastatin Sodium 40 Mg Tabs (Pravastatin Sodium) .... Take One Tablet in The Evening 6)  Chlorthalidone 25 Mg Tabs (Chlorthalidone) .... Take One Half Tablet By Mouth Once Daily 7)  Potassium Chloride 20 Meq Pack (Potassium Chloride) .... Take One Tablet By Mouth Once Daily 8)  Ambien 10 Mg Tabs (Zolpidem Tartrate) .... Take 1 By Mouth At Bedtime As Needed 9)  Ibuprofen 600 Mg Tabs (Ibuprofen) .... Take 1 By Mouth Three Times A Day As Needed 10)  Flonase 50 Mcg/act Susp (Fluticasone Propionate) .... As Needed 11)  Epipen 0.3 Mg/0.54m Devi (Epinephrine) .... For Severe Allergic Reaction  Allergies: 1)  ! * Remicade  Past History:  Past Medical History: Reviewed history from 08/18/2009 and no changes required. 1. B12 DEFICIENCY (ICD-266.2) 2. ANXIETY (ICD-300.00) 3. PALPITATIONS (ICD-785.1) 4. DEPRESSION (ICD-311) 5. HYPERLIPIDEMIA (ICD-272.4) 6. GERD (ICD-530.81) 7. CHOLELITHIASIS W/O CHOLECYSTITIS W/O OBST (ICD-574.20) 8. CROHN'S DISEASE, HX OF (ICD-V12.70): Followed by Dr. BOlevia Perches9. FIBROMYALGIA (ICD-729.1) 10. ALLERGIC RHINITIS (ICD-477.9) 11. Atypical chest pain: ETT-myoview (3/11) with EF 67%, hypertensive BP response (234/58), normal perfusion images.   12. Chronic low back pain 13. Palpitations: Holter (3/11) showed occasional PVCs.  14. HTN    Past Surgical History: Reviewed history from 07/09/2009 and no changes required. Tonsillectomy tubal ligation Lapascopic exploratory surgery OBGYN  Family History: Mother died with MI at age 52 Father with CVA and  a heart valve replacement.  Family History High cholesterol: mother, brother, sister Family History Hypertension: mother, sister, brother Family History of Stroke F 1st degree relative <60: father gma with lung cancer (smoker) Family History of  Heart Disease: Father No FH of Colon Cancer:  Social History: Reviewed history from 08/01/2009 and no changes required. Former Smoker- age 37 to 42 Alcohol use-no Illicit Drug Use - no Patient does not get regular exercise.  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  The patient denies allergy/sinus, anemia, anxiety-new, arthritis/joint pain, blood in urine, breast changes/lumps, change in vision, confusion, cough, coughing up blood, depression-new, fainting, fatigue, fever, headaches-new, hearing problems, heart murmur, heart rhythm changes, itching, menstrual pain, muscle pains/cramps, night sweats, nosebleeds, pregnancy symptoms, shortness of breath, skin rash, sore throat, swelling of feet/legs, swollen lymph glands, thirst - excessive , urination - excessive , urination changes/pain, urine leakage, vision changes, and voice change.         Pertinent positive and negative review of systems were noted in the above HPI. All other ROS was otherwise negative.   Vital Signs:  Patient profile:   50 year old female Height:      63 inches Weight:      131.6 pounds BMI:     23.40 Pulse rate:   78 / minute Pulse rhythm:   regular BP sitting:   100 / 64  (left arm) Cuff size:   regular  Vitals Entered By: Bernita Buffy CMA Deborra Medina) (June 15, 2010 3:14 PM)  Physical Exam  General:  Well developed, well nourished, no acute distress. Eyes:  PERRLA, no icterus. Mouth:  No deformity or lesions, dentition normal. Neck:  Supple; no masses or thyromegaly. Lungs:  Clear throughout to auscultation. Heart:  Regular rate and rhythm; no murmurs, rubs,  or bruits. Abdomen:  soft abdomen with normoactive bowel sounds. Minimal tenderness in right lower quadrant. Liver edge at costal margin. Left lower and upper quadrants unremarkable. Rectal:  normal perianal area.  Normal rectal sphincter tone. Stool is soft Hemoccult-negative. Msk:  tender right sacroiliac joint. Extremities:  No clubbing, cyanosis, edema or deformities noted. Skin:  Intact without significant lesions or rashes. Psych:  Alert and cooperative. Normal mood and affect.   Impression & Recommendations:  Problem # 1:  GERD (ICD-530.81) This is controlled on Nexium 40 mg daily.  Problem # 2:  CROHN'S DISEASE, HX OF (ICD-V12.70) There was mild narrowing of the ileocecal valve with pseudopolyps in the terminal ileum as per her last colonoscopy in March 2011. We will plan to continue 6-MP 50 mg daily. We will recheck her blood counts and anemia panel today. Orders: TLB-CBC Platelet - w/Differential (85025-CBCD) TLB-B12, Serum-Total ONLY (76283-T51) TLB-Ferritin (76160-VPX) TLB-Folic Acid (Folate) (10626-RSW) TLB-Iron, (Fe) Total (83540-FE) TLB-IBC Pnl (Iron/FE;Transferrin) (83550-IBC) TLB-CMP (Comprehensive Metabolic Pnl) (54627-OJJK)  Patient Instructions: 1)  Your physician requests that you go to the basement floor of our office to have the following labwork completed before leaving today: CBC, CMET, Anemia Panel 2)  Please pick up your prescriptions at the pharmacy. Electronic prescription(s) has already been sent for Nexium and 6 mp. 3)  Copy sent to : Dr Denny Peon, Dr Sherald Barge 4)  The medication list was reviewed and reconciled.  All changed / newly prescribed medications were explained.  A complete medication list was provided to the  patient / caregiver. Prescriptions: MERCAPTOPURINE 50 MG  TABS (MERCAPTOPURINE) Take 1 tablet by mouth once a day.  #30 x 3   Entered by:   Madlyn Frankel CMA (AAMA)   Authorized by:   Lafayette Dragon MD   Signed by:   [place] ([place]) on 06/15/2010   Method used:   Electronically to        [facility] ([facility]ail)       [address] [place], [place]   [address]       Ph: [phone] or [phone]       Fax: [phone]   RxID:   [id number]

## 2010-07-02 NOTE — Letter (Signed)
Summary: Patient's Vitals  Patient's Vitals   Imported By: Marilynne Drivers 11/05/2009 14:41:10  _____________________________________________________________________  External Attachment:    Type:   Image     Comment:   External Document

## 2010-07-02 NOTE — Progress Notes (Signed)
Summary: B/P READING  Phone Note Call from Patient Call back at [phone]   Caller: Patient Call For: young Summary of Call: pt calling to get B/P reading from ov on 08/13/2009 faxed to [phone] Initial call taken by: Gustavus Bryant,  August 13, 2009 8:34 AM  Follow-up for Phone Call        pt states she asked nurse yesterday to write BP reading down because she has to keep track of this for her cardiology MD, but she left without this. She is requesting BP reading be written on something with our office name on it and it be faxed to her personal fax number. this has been done. pt aware. [place] Bing CMA  August 13, 2009 9:15 AM

## 2010-07-02 NOTE — Letter (Signed)
Summary: [facility]  [address], [place] [address]   Phone: [phone]  Fax: [phone]    08/13/2009  [facility] [place], [place]  [address]  Dear Ms. Postiglione,     Your blood pressure reading from office visit on 08/12/09 was 122/80.      Sincerely,   Baird Lyons, MD

## 2010-07-02 NOTE — Progress Notes (Signed)
Summary: QUESTION ON NEW MEDICATION-appt 08-18-09 DM   Phone Note Call from Patient Call back at [place] # [phone]   Caller: Patient Reason for Call: Talk to Nurse, Talk to Doctor Summary of Call: PT PCP PUT HER ON A APPITITE Eva TO KNOW IF THAT  [place] Initial call taken by: Shelda Pal,  August 12, 2009 2:47 PM  Follow-up for Phone Call        LM cell # [place] Ford -I have tried both numbers listed in this message for Nancee Liter and was told they were both wrong numbers -I talked with Nancee Liter at [phone] and she said she did not call about taking Phentermine--

## 2010-07-02 NOTE — Procedures (Signed)
Summary: Upper Endoscopy  Patient: [name] Note: All result statuses are Final unless otherwise noted.  Tests: (1) Upper Endoscopy (EGD)   EGD Upper Endoscopy       [facility]     [place], [place]  [address]           ENDOSCOPY PROCEDURE REPORT           PATIENT:  [name], [name]  MR#:  [id number]     BIRTHDATE:  1960/07/16, 36 yrs. old  GENDER:  female           ENDOSCOPIST:  Lowella Bandy. Olevia Perches, MD     Referred by:  Arlyss Queen, M.D.           PROCEDURE DATE:  08/22/2009     PROCEDURE:  EGD with biopsy     ASA CLASS:  Class II     INDICATIONS:  GERD, dysphagia EGD 2001,08/2007- esophagitis           MEDICATIONS:   Versed 5 mg, Fentanyl 50 mcg     TOPICAL ANESTHETIC:  Exactacain Spray           DESCRIPTION OF PROCEDURE:   After the risks benefits and     alternatives of the procedure were thoroughly explained, informed     consent was obtained.  The LB [id number] endoscope was     introduced through the mouth and advanced to the second portion of     the duodenum, without limitations.  The instrument was slowly     withdrawn as the mucosa was fully examined.     <<PROCEDUREIMAGES>>           Esophagitis was found in the distal esophagus. one linear erosion     3-5 mm at g-e junction With standard forceps, a biopsy was     obtained and sent to pathology (see image1, image2, image6, and     image7).  Otherwise the examination was normal (see image5,     image4, and image3).    Retroflexed views revealed no     abnormalities.    The scope was then withdrawn from the patient     and the procedure completed.           COMPLICATIONS:  None           ENDOSCOPIC IMPRESSION:     1) Esophagitis in the distal esophagus     2) Otherwise normal examination     Grade I esophagitis, r/o Barrett's, irregular z-line     no evidence of a stricture     RECOMMENDATIONS:     1) Anti-reflux regimen to be follow     Nexiem 40 mg po qd continue             REPEAT EXAM:  In 0 year(s) for.           ______________________________     Lowella Bandy. Olevia Perches, MD           CC:           n.     eSIGNED:   Lowella Bandy. Emmalynne Courtney at 08/22/2009 12:32 PM           [place], [place], [phone]  Note: An exclamation mark (!) indicates a result that was not dispersed into the flowsheet. Document Creation Date: 08/22/2009 12:51 PM _______________________________________________________________________  (1) Order result status: Final Collection or observation date-time: 08/22/2009 12:10 Requested  date-time:  Receipt date-time:  Reported date-time:  Referring Physician:   Ordering Physician: [name] [phone]) Specimen Source:  Source: Tawanna Cooler Order Number: [id number] Lab site:

## 2010-07-02 NOTE — Procedures (Signed)
Summary: SUMMARY REPORT  SUMMARY REPORT   Imported By: Parks Neptune 08/19/2009 13:47:02  _____________________________________________________________________  External Attachment:    Type:   Image     Comment:   External Document

## 2010-07-02 NOTE — Procedures (Signed)
Summary: Colonoscopy  Patient: [name] Note: All result statuses are Final unless otherwise noted.  Tests: (1) Colonoscopy (COL)   COL Colonoscopy           [facility].     [place], [place]  [address]           COLONOSCOPY PROCEDURE REPORT           PATIENT:  [name], [name]  MR#:  [id number]     BIRTHDATE:  01-Apr-1961, 49 yrs. old  GENDER:  female     ENDOSCOPIST:  Lowella Bandy. Olevia Perches, MD     REF. BY:  Arlyss Queen, M.D.     PROCEDURE DATE:  08/22/2009     PROCEDURE:  Colonoscopy [address]     ASA CLASS:  Class II     INDICATIONS:  Crohn'd didease of ileocecal valve since 1991, colon     1994,1996,2001,2006 show Pseudopolyps in last 8 cm of TI, milde     narrowing of the IC valve, no hx of colon involement     MEDICATIONS:   Versed 5 mg, Fentanyl 50 mcg           DESCRIPTION OF PROCEDURE:   After the risks benefits and     alternatives of the procedure were thoroughly explained, informed     consent was obtained.  Digital rectal exam was performed and     revealed no rectal masses.   The LB CF-[id number] endoscope     was introduced through the anus and advanced to the terminal ileum     which was intubated for a short distance, without limitations.     The quality of the prep was good, using MiraLax.  The instrument     was then slowly withdrawn as the colon was fully examined.     <<PROCEDUREIMAGES>>           FINDINGS:  There were mucosal changes consistent with Crohn's     disease. at the ileocecal valve. deformed widely open IC valve, no     obstruction, no active ulcers With standard forceps, biopsy was     obtained and sent to pathology (see image3 and image1).  There     were inflammatory changes in the ileum consistent wih Crohn's     disease. in the ileum. many pseudopolyps in TI, no active ulcers,     no stricture With standard forceps, biopsy was obtained and sent     to pathology.  This was otherwise a normal examination of the  colon. normal appearing colon Random biopsies were obtained and     sent to pathology (see image2 and image5).   Retroflexed views in     the rectum revealed no abnormalities.    The scope was then     withdrawn from the patient and the procedure completed.           COMPLICATIONS:  None     ENDOSCOPIC IMPRESSION:     1) Colitis - Crohn's at the ileocecal valve     2) Ileitis - Crohn's in the ileum     3) Otherwise normal examination     no active Crohn's disease, only pseudopolyps, IC valve not     obstructed, TI with pseudopolyps,     no change from prior colonoscopies- no progression over past 20     years     RECOMMENDATIONS:     6MP  50 mg po qd     B12 monthly,     REPEAT EXAM:  In 5 year(s) for.           ______________________________     Lowella Bandy. Olevia Perches, MD           CC:           n.     eSIGNED:   Lowella Bandy. Brodie at 08/22/2009 12:44 PM           [place], [place], [id number]  Note: An exclamation mark (!) indicates a result that was not dispersed into the flowsheet. Document Creation Date: 08/22/2009 12:51 PM _______________________________________________________________________  (1) Order result status: Final Collection or observation date-time: 08/22/2009 12:23 Requested date-time:  Receipt date-time:  Reported date-time:  Referring Physician:   Ordering Physician: [name] [phone]) Specimen Source:  Source: Tawanna Cooler Order Number: [id number] Lab site:   Appended Document: Colonoscopy     Procedures Next Due Date:    Colonoscopy: 07/2014

## 2010-07-02 NOTE — Letter (Signed)
Summary: Patient [facility] Biopsy Results  [facility]  [place], [place] [address]   Phone: [phone]  Fax: [phone]        August 25, 2009 MRN: [id number]    [facility] [address] [place],   [address]    Dear Ms. Touch,  I am pleased to inform you that the biopsies taken during your recent endoscopic examination did not show any evidence of cancer upon pathologic examination.The tissue from Your esophagus shows mild reflux esophagitis  Additional information/recommendations:  __No further action is needed at this time.  Please follow-up with      your primary care physician for your other healthcare needs.  __ Please call [phone] to schedule a return visit to review      your condition.  _x_ Continue with the treatment plan as outlined on the day of your      exam.     Please call us if you are having persistent problems or have questions about your condition that have not been fully answered at this time.  Sincerely,  Lafayette Dragon MD  This letter has been electronically signed by your physician.  Appended Document: Patient Notice-Endo Biopsy Results letter mailed 3.30.11

## 2010-07-02 NOTE — Consult Note (Signed)
Summary: [facility]   [facility]   Imported By: Marilynne Drivers 10/28/2009 16:01:57  _____________________________________________________________________  External Attachment:    Type:   Image     Comment:   External Document

## 2010-07-02 NOTE — Letter (Signed)
Summary: Patient Notice- Colon Biospy Results  [facility]  [address] [place], [place] [address]   Phone: [phone]  Fax: [phone]        August 25, 2009 MRN: [id number]    [facility] [place], [place]  [address]    Dear Ms. Overholt,  I am pleased to inform you that the biopsies taken during your recent colonoscopy did not show any evidence of cancer upon pathologic examination.The biopsies from Your small intestine and ileocecal valve show mild ileitis    Additional information/recommendations:  __No further action is needed at this time.  Please follow-up with      your primary care physician for your other healthcare needs.  __Please call [phone] to schedule a return visit to review      your condition.  _x_Continue with the treatment plan as outlined on the day of your      exam.  _x_You should have a repeat colonoscopy examination for this problem           in 5-7_ years.  Please call us if you are having persistent problems or have questions about your condition that have not been fully answered at this time.  Sincerely,  Lafayette Dragon MD   This letter has been electronically signed by your physician.  Appended Document: Patient Notice- Colon Biospy Results letter mailed 3.30.11

## 2010-07-02 NOTE — Progress Notes (Signed)
Summary: Nuclear Pre-Procedure  Phone Note Outgoing Call Call back at [phone] Cell   Call placed by: Eliezer Lofts, EMT-P,  August 06, 2009 3:33 PM Call placed to: Patient Action Taken: Phone Call Completed Summary of Call: Reviewed information on Myoview Information Sheet (see scanned document for further details).  Spoke with Patient.    Nuclear Med Background Indications for Stress Test: Evaluation for Ischemia   History: Abnormal EKG, COPD, Emphysema, Myocardial Perfusion Study  History Comments: '06 MPS: EF=57%, low risk, mild ischemia  Symptoms: Chest Pain, Chest Tightness, DOE, Palpitations, Rapid HR    Nuclear Pre-Procedure Cardiac Risk Factors: Family History - CAD, Lipids, Smoker Height (in): 63

## 2010-07-02 NOTE — Miscellaneous (Signed)
Summary: Mercaptopurine RX  Clinical Lists Changes  Medications: Changed medication from MERCAPTOPURINE 50 MG  TABS (MERCAPTOPURINE) Take 1 tablet by mouth once a day. to MERCAPTOPURINE 50 MG  TABS (MERCAPTOPURINE) Take 1 tablet by mouth once a day. NEED OFFICE VISIT FOR FURTHER REFILLS! - Signed Rx of MERCAPTOPURINE 50 MG  TABS (MERCAPTOPURINE) Take 1 tablet by mouth once a day. NEED OFFICE VISIT FOR FURTHER REFILLS!;  #30 x 2;  Signed;  Entered by: Madlyn Frankel CMA (AAMA);  Authorized by: Lafayette Dragon MD;  Method used: Electronically to [facility] [place] [address], [place], [place]  [address], Ph: [phone] or [phone], Fax: [phone]    Prescriptions: MERCAPTOPURINE 50 MG  TABS (MERCAPTOPURINE) Take 1 tablet by mouth once a day. NEED OFFICE VISIT FOR FURTHER REFILLS!  #30 x 2   Entered by:   Madlyn Frankel CMA (AAMA)   Authorized by:   Lafayette Dragon MD   Signed by:   [place] ([place]) on 05/01/2010   Method used:   Electronically to        [facility] (retail)       [address] [place], [place]  [address]       Ph: [phone] or [phone]       Fax: [phone]   RxID:   [id number]

## 2010-07-02 NOTE — Progress Notes (Signed)
Summary: New medication needed   Phone Note Other Incoming   Caller: Sherry in Nuclear Medicine Details for Reason: New medications Summary of Call: Judeen Hammans in Nuclear Medicine came to see Dr. Aundra Dubin the pt's results of her nuclear testing.  He ordered for the pt to start chlorthalidone 12.5 mg daily and potassium chloride 20 mEq daily.  This was electronically ordered & sent to [facility]. Initial call taken by: Merdis Delay, RN, BSN,  August 07, 2009 2:31 PM     Appended Document: New medication needed Have patient get BMET  10 days after starting chlorthalidone.   Appended Document: New medication needed LMVM  Appended Document: New medication needed discussed with pt by telephone--will get BMP at time of OV with Dr Aundra Dubin 08-18-09

## 2010-07-02 NOTE — Letter (Signed)
Summary: [facility] Instructions  [facility]  [place], [place] [address]   Phone: [phone]  Fax: [phone]       [name]    [birth year]    MRN: [id number]       Procedure Day /Date: 08/22/09 (Friday)     Arrival Time: 10:00 am     Procedure Time: 11:00 am    Location of Procedure:                    _x _  [facility] (4th Floor)    [place]  Starting 5 days prior to your procedure 3/20/11do not eat nuts, seeds, popcorn, corn, beans, peas,  salads, or any raw vegetables.  Do not take any fiber supplements (e.g. Metamucil, Citrucel, and Benefiber). ____________________________________________________________________________________________________   THE DAY BEFORE YOUR PROCEDURE         DATE: 08/21/09 DAY:  Thursday 1   Drink clear liquids the entire day-NO SOLID FOOD  2   Do not drink anything colored red or purple.  Avoid juices with pulp.  No orange juice.  3   Drink at least 64 oz. (8 glasses) of fluid/clear liquids during the day to prevent dehydration and help the prep work efficiently.  CLEAR LIQUIDS INCLUDE: Water Jello Ice Popsicles Tea (sugar ok, no milk/cream) Powdered fruit flavored drinks Coffee (sugar ok, no milk/cream) Gatorade Juice: apple, white grape, white cranberry  Lemonade Clear bullion, consomm, broth Carbonated beverages (any kind) Strained chicken noodle soup Hard Candy  4   Mix the entire bottle of Miralax with 64 oz. of Gatorade/Powerade in the morning and put in the refrigerator to chill.  5   At 3:00 pm take 2 Dulcolax/Bisacodyl tablets.  6   At 4:30 pm take one Reglan/Metoclopramide tablet.  7  Starting at 5:00 pm drink one 8 oz glass of the Miralax mixture every 15-20 minutes until you have finished drinking the entire 64 oz.  You should finish drinking prep around 7:30 or 8:00 pm.  8   If you are nauseated, you may take the 2nd Reglan/Metoclopramide tablet at  6:30 pm.        9    At 8:00 pm take 2 more DULCOLAX/Bisacodyl tablets.          THE DAY OF YOUR PROCEDURE      DATE:  08/22/09 DAY: Friday  You may drink clear liquids until 9:00 am  (2 HOURS BEFORE PROCEDURE).   MEDICATION INSTRUCTIONS  Unless otherwise instructed, you should take regular prescription medications with a small sip of water as early as possible the morning of your procedure.          OTHER INSTRUCTIONS  You will need a responsible adult at least 52 years of age to accompany you and drive you home.   This person must remain in the waiting room during your procedure.  Wear loose fitting clothing that is easily removed.  Leave jewelry and other valuables at home.  However, you may wish to bring a book to read or an iPod/MP3 player to listen to music as you wait for your procedure to start.  Remove all body piercing jewelry and leave at home.  Total time from sign-in until discharge is approximately 2-3 hours.  You should go home directly after your procedure and rest.  You can resume normal activities the day after your procedure.  The day of your procedure you should not:  Drive   Make legal decisions   Operate machinery   Drink alcohol   Return to work  You will receive specific instructions about eating, activities and medications before you leave.   The above instructions have been reviewed and explained to me by   Awilda Bill, CMA    I fully understand and can verbalize these instructions _____________________________ Date 07/09/09

## 2010-07-02 NOTE — Assessment & Plan Note (Signed)
Summary: Cardiology Nuclear Study  Nuclear Med Background Indications for Stress Test: Evaluation for Ischemia   History: Abnormal EKG, COPD, Emphysema, Myocardial Perfusion Study  History Comments: '06 MPS:? mild anterior and apical ischemia, EF=57%    Symptoms: Chest Pain, DOE, Fatigue, Fatigue with Exertion, Palpitations, Rapid HR  Symptoms Comments: Last episode of CP:2 days ago.   Nuclear Pre-Procedure Cardiac Risk Factors: Family History - CAD, Lipids, Smoker Caffeine/Decaff Intake: none NPO After: 8:00 AM Lungs: Clear IV 0.9% NS with Angio Cath: 20g     IV Site: (R) AC IV Started by: Eliezer Lofts EMT-P Chest Size (in) 36     Cup Size B     Height (in): 63 Weight (lb): 142 BMI: 25.25  Nuclear Med Study 1 or 2 day study:  1 day     Stress Test Type:  Stress Reading MD:  Jenkins Rouge, MD     Referring MD:  Loralie Champagne, MD Resting Radionuclide:  Technetium 63mTetrofosmin     Resting Radionuclide Dose:  11.0 mCi  Stress Radionuclide:  Technetium 979metrofosmin     Stress Radionuclide Dose:  33.0 mCi   Stress Protocol Exercise Time (min):  8:15 min     Max HR:  160 bpm     Predicted Max HR:  16696pm  Max Systolic BP: 23789m Hg     Percent Max HR:  94.67 %     METS: 10.1 Rate Pressure Product:  3738101  Stress Test Technologist:  ShValetta FullerMA-N     Nuclear Technologist:  WaMariann Lastereal RT-N  Rest Procedure  Myocardial perfusion imaging was performed at rest 45 minutes following the intravenous administration of Myoview Technetium 9937mtrofosmin.  Stress Procedure  The patient exercised for 8:15.  The patient stopped due to fatigue and denied any chest pain.  There were no diagnostic ST-T wave changes, occasional PVC's and PAC's seen.  She had a hypertensive response to exercise, 234/58 (this was discussed today with Dr. McLAundra Dubin Myoview was injected at peak exercise and myocardial perfusion imaging was performed after a brief delay.  QPS Raw Data Images:  Normal;  no motion artifact; normal heart/lung ratio. Stress Images:  NI: Uniform and normal uptake of tracer in all myocardial segments. Rest Images:  Normal homogeneous uptake in all areas of the myocardium. Subtraction (SDS):  Normal Transient Ischemic Dilatation:  .97  (Normal <1.22)  Lung/Heart Ratio:  .29  (Normal <0.45)  Quantitative Gated Spect Images QGS EDV:  79 ml QGS ESV:  26 ml QGS EF:  67 % QGS cine images:  normal  Findings Normal nuclear study      Overall Impression  Exercise Capacity: Fair exercise capacity. BP Response: Hypertensive blood pressure response. Clinical Symptoms: Fatigue ECG Impression: No significant ST segment change suggestive of ischemia. Overall Impression: Normal stress nuclear study.  Appended Document: Cardiology Nuclear Study Normal except for hypertensive BP response.   Appended Document: Cardiology Nuclear Study LMVM   Appended Document: Cardiology Nuclear Study discussed results with pt by telephone

## 2010-07-02 NOTE — Progress Notes (Signed)
Summary: ALLERGY  Phone Note Other Incoming   Caller: T.Scott Details for Reason: Allergy shots? Summary of Call: Dr.Young, Are you going to restart [name] on her allergy shots? In your notes you said she would be able to get her shots from the PA at work.Please advise what strength and dose to start her on. Thanks Initial call taken by: Alroy Bailiff,  August 13, 2009 10:11 AM  Follow-up for Phone Call        She may get her vaccine from the Nurse Practioner at work. Has Epipen.  I think she has been on 1:10, but has missed for a couple of months.   Plan: Take her back down ot 0.1/ vial/ week and rebuild as tolerated. Please make direct contact with [name] so she knows how to reach you directly withany questions. Follow-up by: Deneise Lever MD,  August 14, 2009 1:33 PM  Additional Follow-up for Phone Call Additional follow up Details #1::        I called [name] and lmom for her to call me back with [name]'s #. I also told her to let me know when she wants to pick up her vac. Additional Follow-up by: Alroy Bailiff,  August 14, 2009 3:52 PM    New/Updated Medications: * ALLERGY VACCINE 1:10 AT WORK BY F-NP PER SCHEDULE

## 2010-07-02 NOTE — Miscellaneous (Signed)
Summary: Allergy Injection Program/[name]  Allergy Injection Program/[name]   Imported By: Phillis Knack 09/15/2009 07:42:36  _____________________________________________________________________  External Attachment:    Type:   Image     Comment:   External Document

## 2010-07-02 NOTE — Assessment & Plan Note (Signed)
Summary: 2wk f/u sl  Medications Added ESTRADIOL   POWD (ESTRADIOL) use every other day NORETHINDRONE ACETATE 5 MG  TABS (NORETHINDRONE ACETATE) 1 tablet every other day      Allergies Added:   Referring Provider:  n/a Primary Provider:  Dr. Janett Billow Copland   History of Present Illness: 50 yo with history of Crohns disease, GERD, and hyperlipidemia returns for evaluation of chest pain and palpitations.  Patient is still having episodes of atypical chest pain.  ETT-myoview showed no evidence for ischemia or infarction and normal EF.  I wonder if the chest pain could be related to her fibromyalgia as she has been having pain in multiple areas.  She is still having palpitations.  Holter monitor showed occasional PVCs which I suspect are the cause of her symptoms.  She is still drinking 2-3 caffeinated drinks a day and is using over-the-counter cold medications frequently.  BP at home is running 110-122/70-80 since starting chlorthalidone.    Labs (3/11): K 3.7, creatinine 0.7  Current Medications (verified): 1)  Mercaptopurine 50 Mg  Tabs (Mercaptopurine) .... Take 1 Tablet By Mouth Once A Day. 2)  Estradiol   Powd (Estradiol) .... Use Every Other Day 3)  Norethindrone Acetate 5 Mg  Tabs (Norethindrone Acetate) .Marland Kitchen.. 1 Tablet Every Other Day 4)  Allergy Vaccine 1:10 At Work By F-Np .... Per Schedule 5)  Nexium 40 Mg  Cpdr (Esomeprazole Magnesium) .... Take 1 Tablet By Mouth Once Daily 6)  Zoloft 50 Mg Tabs (Sertraline Hcl) .... Take 1 By Mouth Once Daily 7)  Pravastatin Sodium 40 Mg Tabs (Pravastatin Sodium) .... Take One Tablet in The Evening 8)  Chlorthalidone 25 Mg Tabs (Chlorthalidone) .... Take One Half Tablet By Mouth Once Daily 9)  Potassium Chloride 20 Meq Pack (Potassium Chloride) .... Take One Tablet By Mouth Once Daily 10)  Ambien 10 Mg Tabs (Zolpidem Tartrate) .... Take 1 By Mouth At Bedtime As Needed 11)  Ibuprofen 600 Mg Tabs (Ibuprofen) .... Take 1 By Mouth Three Times A Day  As Needed 12)  Flonase 50 Mcg/act Susp (Fluticasone Propionate) .... As Needed 13)  Epipen 0.3 Mg/0.72m Devi (Epinephrine) .... For Severe Allergic Reaction  Allergies (verified): 1)  ! * Remicade  Past History:  Past Medical History: 1. B12 DEFICIENCY (ICD-266.2) 2. ANXIETY (ICD-300.00) 3. PALPITATIONS (ICD-785.1) 4. DEPRESSION (ICD-311) 5. HYPERLIPIDEMIA (ICD-272.4) 6. GERD (ICD-530.81) 7. CHOLELITHIASIS W/O CHOLECYSTITIS W/O OBST (ICD-574.20) 8. CROHN'S DISEASE, HX OF (ICD-V12.70): Followed by Dr. BOlevia Perches9. FIBROMYALGIA (ICD-729.1) 10. ALLERGIC RHINITIS (ICD-477.9) 11. Atypical chest pain: ETT-myoview (3/11) with EF 67%, hypertensive BP response (234/58), normal perfusion images.   12. Chronic low back pain 13. Palpitations: Holter (3/11) showed occasional PVCs.  119 HTN    Family History: Reviewed history from 08/01/2009 and no changes required. Mother died with MI at age 52 Father with CVA and a heart valve replacement.  Family History High cholesterol Family History Hypertension Family History of Stroke F 1st degree relative <60 gma with lung cancer Family History of Heart Disease: Father No FH of Colon Cancer:  Social History: Reviewed history from 08/01/2009 and no changes required. Former Smoker- age [age]to 89Alcohol use-no Illicit Drug Use - no Patient does not get regular exercise.  Daily Caffeine Use: 2 cups daily  [place] and negative except as per HPI.   Vital Signs:  Patient profile:   50year old  female Height:      63 inches Weight:      143 pounds BMI:     25.42 Pulse rate:   78 / minute Pulse rhythm:   regular BP sitting:   120 / 64  (left arm) Cuff size:   regular  Vitals Entered By: Doug Sou CMA (August 18, 2009 4:49 PM)  Physical Exam  General:  Well developed, well nourished, in no acute distress. Neck:  Neck supple, no JVD. No masses, thyromegaly or  abnormal cervical nodes. Lungs:  Clear bilaterally to auscultation and percussion. Heart:  Non-displaced PMI, chest non-tender; regular rate and rhythm, S1, S2 without murmurs, rubs or gallops. Carotid upstroke normal, no bruit.  Pedals normal pulses. No edema, no varicosities. Abdomen:  Bowel sounds positive; abdomen soft and non-tender without masses, organomegaly, or hernias noted. No hepatosplenomegaly. Extremities:  No clubbing or cyanosis. Neurologic:  Alert and oriented x 3. Psych:  Normal affect.   Impression & Recommendations:  Problem # 1:  CHEST PAIN UNSPECIFIED (ICD-786.50) Atypical chest pain in a woman with cardiac risk factors that include hyperlipidemia, HTN, and family history.  She had a myoview this month that did not show any evidence of ischemia or infarction.  Low risk study, suspect chest pain was noncardiac.    Problem # 2:  ESSENTIAL HYPERTENSION, BENIGN (ICD-401.1) BP is at goal on chlorthalidone 12.5 mg daily.  BMET today was ok on KCl supplement.   Problem # 3:  PALPITATIONS (ICD-785.1) Occasional PVCs on holter.  These are likely the cause of her symptoms.  She should try to cut back on caffeine and avoid over-the-counter cold meds except for Coricidin and guaifenesin.    Patient Instructions: 1)  Your physician recommends that you schedule a follow-up appointment as needed with Dr Aundra Dubin.

## 2010-07-02 NOTE — Progress Notes (Signed)
Summary: refill complications   Phone Note Call from Patient Call back at [phone]   Caller: Patient Call For: Dr. Olevia Perches Reason for Call: Talk to Nurse Summary of Call: having trouble obtaining rx for Mercaptopurine... [facility] Initial call taken by: Lucien Mons,  January 05, 2010 4:44 PM  Follow-up for Phone Call        We have gotten no correspondence from [facility] although the patient states that the pharmacy has told her continuously that they have sent [place] several requests. I am unsure as to what fax number they are sending the request to. I will send 54m to patients pharmacy. Follow-up by: DMadlyn FrankelCMA (Deborra Medina,  January 05, 2010 5:03 PM    Prescriptions: MERCAPTOPURINE 50 MG  TABS (MERCAPTOPURINE) Take 1 tablet by mouth once a day.  #30 x 3   Entered by:   DMadlyn FrankelCMA (AAMA)   Authorized by:   DLafayette DragonMD   Signed by:   [place] on 01/05/2010   Method used:   Electronically to        [facility]ail)       [address] [place]  [address]      Ph: [phone] [phone]      Fax: [phone]  RxID:   [id number]

## 2010-07-02 NOTE — Miscellaneous (Signed)
Summary: Injection Record/[facility]  Injection Record/[facility]   Imported By: Phillis Knack 10/21/2009 13:28:09  _____________________________________________________________________  External Attachment:    Type:   Image     Comment:   External Document

## 2010-07-02 NOTE — Progress Notes (Signed)
Summary: B12   Phone Note Call from Patient Call back at [phone]   Caller: Patient Call For: Dr. Olevia Perches Reason for Call: Talk to Nurse Summary of Call: pt wants to know why she is sch'ed to come in for B12 injection... pt thought she didnt need them anymore Initial call taken by: Lucien Mons,  August 04, 2009 10:13 AM  Follow-up for Phone Call         Dr. Olevia Perches her 6th injection was scheduled at the time she got her 5th.injection.Her B12 level on 07/09/2009 was above 1500 mg. so should that injection be cx.? Follow-up by: Abel Presto RN,  August 04, 2009 10:38 AM  Additional Follow-up for Phone Call Additional follow up Details #1::        please cancel the 6th injection. Repeat B12 level in 6 month Additional Follow-up by: Lafayette Dragon MD,  August 04, 2009 1:20 PM    Additional Follow-up for Phone Call Additional follow up Details #2::    Pt. ntfd. that B12 inj. cx'd and she needs repeat level in 6 months. Also cx'd pre-visit as she was given her procedure instructions at time of ov. Follow-up by: Abel Presto RN,  August 04, 2009 2:39 PM

## 2010-07-02 NOTE — Progress Notes (Signed)
Summary: messed up prep   Phone Note Call from Patient Call back at [phone]   Caller: Patient Call For: Olevia Perches Reason for Call: Talk to Nurse Summary of Call: Patient did not follow prep instructions for the five days prior and her proc is tomorrow Initial call taken by: Ronalee Red,  August 21, 2009 8:03 AM  Follow-up for Phone Call        Patient states that she did not pay any attention to instructions regarding what not to eat beginning 5 days prior to her exam and so she ate some beans. Patient has been advised that she is to stay on clear liquids only all day today and tommorrow until her procedure and will proceed as planned. Patient verbalizes understanding. Follow-up by: Awilda Bill CMA Deborra Medina),  August 21, 2009 8:21 AM

## 2010-07-02 NOTE — Miscellaneous (Signed)
Summary: Injection [employer]   Imported By: Phillis Knack 02/27/2010 07:45:44  _____________________________________________________________________  External Attachment:    Type:   Image     Comment:   External Document

## 2010-07-02 NOTE — Assessment & Plan Note (Signed)
Summary: difficulty swallowing..em    History of Present Illness Visit Type: Follow-up Visit Primary GI MD: [name] MD Primary Provider: [facility]  Requesting Provider: n/a Chief Complaint: Patient getting choked on any solid foods History of Present Illness:   This is a 50 year old white female with Crohn's disease of the terminal ileum since 1991. Her last colonoscopy in March 2006 showed Crohn's disease of the terminal ileum with ulcers and pseudopolyps. The lumen of the terminal ileum was slightly narrowed. She has chronic gastroesophageal reflux and most recently has developed  solid food dysphagia. An upper endoscopy in February 2001 and again in April 2009 showed  an irregular Z line and chronic esophagitis without metaplasia. An abdominal ultrasound in April 2009 was normal. She has been on 6 mercaptopurine 50 mg daily. She was found to be B12 deficient in July 2010 with a B12 of 179 pg and has been on B12 1000ug IM supplements monthly. Other medical problems include sacroiliitis. She stopped smoking several years ago.   GI Review of Systems    Reports acid reflux, belching, and  dysphagia with solids.      Denies abdominal pain, bloating, chest pain, dysphagia with liquids, heartburn, loss of appetite, nausea, vomiting, vomiting blood, weight loss, and  weight gain.      Reports diarrhea.     Denies anal fissure, black tarry stools, change in bowel habit, constipation, diverticulosis, fecal incontinence, heme positive stool, hemorrhoids, irritable bowel syndrome, jaundice, light color stool, liver problems, rectal bleeding, and  rectal pain.    Current Medications (verified): 1)  Mercaptopurine 50 Mg  Tabs (Mercaptopurine) .... Take 1 Tablet By Mouth Once A Day. 2)  Estradiol   Powd (Estradiol) 3)  Norethindrone Acetate 5 Mg  Tabs (Norethindrone Acetate) .Marland Kitchen.. 1 By Mouth Qd 4)  Allergy Vaccine 1:10 Gh .... Per Schedule 5)  Nasonex 50 Mcg/act  Susp (Mometasone Furoate)  .Marland Kitchen.. 1 To 2 Puffs Each Nostril Once Daily 6)  Nexium 40 Mg  Cpdr (Esomeprazole Magnesium) .... Take 1 Tablet By Mouth Once Daily 7)  Zoloft 50 Mg Tabs (Sertraline Hcl) .... Take 1 By Mouth Once Daily 8)  Pravastatin Sodium 10 Mg Tabs (Pravastatin Sodium) .... One Tablet By Mouth Once Daily  Allergies (verified): No Known Drug Allergies  Past History:  Past Medical History: Reviewed history from 03/16/2007 and no changes required. Allergic rhinitis fibromyalgia crohn gallstones sacroiliitis GERD Hyperlipidemia Depression Anxiety  Past Surgical History: Tonsillectomy tubal ligation Lapascopic exploratory surgery OBGYN  Family History: Reviewed history from 12/23/2008 and no changes required. Family History of CAD Female 1st degree relative <60 Family History High cholesterol Family History Hypertension Family History of Stroke F 1st degree relative <60 gma with lung cancer Family History of Heart Disease: Father No FH of Colon Cancer:  Social History: Reviewed history from 12/23/2008 and no changes required. Former Smoker- age 34 to 15 Alcohol use-no Illicit Drug Use - no Patient does not get regular exercise.  Daily Caffeine Use: 2 cups daily   Review of Systems       The patient complains of allergy/sinus, arthritis/joint pain, back pain, cough, fatigue, headaches-new, heart rhythm changes, muscle pains/cramps, night sweats, sleeping problems, thirst - excessive, and urine leakage.         Pertinent positive and negative review of systems were noted in the above HPI. All other ROS was otherwise negative.   Vital Signs:  Patient profile:   50 year old female Height:  63 inches Weight:      142.38 pounds BMI:     25.31 Pulse rate:   68 / minute Pulse rhythm:   regular BP sitting:   116 / 74  (left arm) Cuff size:   regular  Vitals Entered By: June McMurray Weir Deborra Medina) (July 09, 2009 8:24 AM)  Physical Exam  General:  Well developed, well  nourished, no acute distress. Eyes:  PERRLA, no icterus. Mouth:  No deformity or lesions, dentition normal. Neck:  Supple; no masses or thyromegaly. Lungs:  Clear throughout to auscultation. Heart:  Regular rate and rhythm; no murmurs, rubs,  or bruits. Abdomen:  mildly protuberant, soft with mild tenderness in both left and right lower quadrants. There is no rebound and no palpable mass. Extremities:  No clubbing, cyanosis, edema or deformities noted. Skin:  Intact without significant lesions or rashes. Psych:  Alert and cooperative. Normal mood and affect.   Impression & Recommendations:  Problem # 1:  Hx of B12 DEFICIENCY (ICD-266.2)  We will repeat patient's  B12 level today.  Orders: Vit B12 1000 mcg (S5681)  Problem # 2:  CROHN'S DISEASE, HX OF (ICD-V12.70) Patient has Crohn's disease of the terminal ileum since 1991 with mild narrowing of the terminal ileum seen on her last colonoscopy in 2006. She is due for a repeat colonoscopy in March 2011. Orders: TLB-CBC Platelet - w/Differential (85025-CBCD) TLB-B12, Serum-Total ONLY (27517-G01) Colon/Endo (Colon/Endo)  Problem # 3:  GERD (ICD-530.81)  Patient has chronic gastroesophageal reflux documented on an upper endoscopy in 2001 and again in 2009. Patient has taken Nexium  only on a p.r.n. basis. She now has solid food dysphagia suggestive of an  esophageal stricture. We will proceed with an upper endoscopy  and possible dilation at the time of her colonoscopy. She will start taking her Nexium daily.  Orders: Colon/Endo (Colon/Endo)  Patient Instructions: 1)  Nexium 40 mg daily. 2)  Antireflux measures. 3)  Upper endoscopy and colonoscopy scheduled for the end of March 2011. 4)  Vitamin B12 1,000 mcg IM today. 5)  CBC and B12 levels today. 6)  Copy sent to : [facility], Dr Edilia Bo 7)  The medication list was reviewed and reconciled.  All changed / newly prescribed medications were explained.  A complete  medication list was provided to the patient / caregiver. Prescriptions: DULCOLAX 5 MG  TBEC (BISACODYL) Day before procedure take 2 at 3pm and 2 at 8pm.  #4 x 0   Entered by:   Awilda Bill CMA ([place])   Authorized by:   Lafayette Dragon MD   Signed by:   Awilda Bill CMA ([place]) on 07/09/2009   Method used:   Electronically to        [facility] (retail)       [address] [place], [place]  [address]       Ph: [phone] or [phone]       Fax: [phone]   RxID:   [id number] REGLAN 10 MG  TABS (METOCLOPRAMIDE HCL) As per prep instructions.  #2 x 0   Entered by:   Awilda Bill CMA (AAMA)   Authorized by:   Lafayette Dragon MD   Signed by:   Awilda Bill CMA ([place]) on 07/09/2009   Method used:   Electronically to        [facility] (retail)  [address] [place], [place]  [address]       Ph: [phone] or [phone]       Fax: [phone]   RxID:   [id number] MIRALAX   POWD (POLYETHYLENE GLYCOL 3350) As per prep  instructions.  #255 grams x 0   Entered by:   Awilda Bill CMA ([place])   Authorized by:   Lafayette Dragon MD   Signed by:   Awilda Bill CMA ([place]) on 07/09/2009   Method used:   Electronically to        [facility] (retail)       [address] [place], [place]  [address]       Ph: [phone] or [phone]       Fax: [phone]   RxID:   [id number]    Medication Administration  Injection # 1:    Medication: Vit B12 1000 mcg    Diagnosis: Hx of B12 DEFICIENCY (ICD-266.2)    Route: IM    Site: L deltoid    Exp Date: 11/12    Lot #: [id number]    Mfr: [place]    Patient tolerated injection without complications    Given by: Awilda Bill CMA Deborra Medina) (July 09, 2009 9:26 AM)  Orders Added: 1)  TLB-CBC Platelet - w/Differential  [85025-CBCD] 2)  TLB-B12, Serum-Total ONLY [82607-B12] 3)  Vit B12 1000 mcg [J3420] 4)  Colon/Endo [Colon/Endo]

## 2010-07-02 NOTE — Progress Notes (Signed)
Summary: Triage   Phone Note Call from Patient Call back at [phone]   Caller: Patient Call For: Dr. Olevia Perches Reason for Call: Talk to Nurse Summary of Call: Pt. wants to know if she can do a colon cleanse? Initial call taken by: Webb Laws,  June 24, 2009 3:38 PM  Follow-up for Phone Call        Pt. has Crohn's and wants to know if she can use the "Colon Cleanse" tablets OTC to help her loose weight.  DR.Naftuli Dalsanto PLEASE ADVISE  Follow-up by: Vivia Ewing LPN,  June 24, 3666 3:48 PM  Additional Follow-up for Phone Call Additional follow up Details #1::        OK to use colon ceanser providing her Crohn's is doing well. Additional Follow-up by: Lafayette Dragon MD,  June 24, 2009 8:38 PM    Additional Follow-up for Phone Call Additional follow up Details #2::    Above MD orders reviewed with patient. Pt. instructed to call back as needed.  Follow-up by: Vivia Ewing LPN,  June 25, 1592 8:06 AM

## 2010-07-02 NOTE — Assessment & Plan Note (Signed)
Summary: np6/dx:atypical chest pain/lg  Medications Added PRAVASTATIN SODIUM 40 MG TABS (PRAVASTATIN SODIUM) take one tablet in the evening      Allergies Added: NKDA  Referring Provider:  n/a Primary Provider:  Dr. Janett Billow Copland  CC:  new patient.  Chest pain.  Hx of abnormal EKG.  .  History of Present Illness: 50 yo with history of Crohns disease, GERD, and hyperlipidemia presents for evaluation chest pain.  Patient has been having episodes of moderate substernal chest pain/tightness.  This sometimes occurs after meals and sometimes occurs at other times.  It is not associated with exertion.  The pain seems different from her prior GERD, but as mentioned, it does sometimes happen in relation to meals.  Patient has shortness of breath walking up a hill or a flight of steps.  No dyspnea walking on flat ground.  She gets occasional flutters in her chest and feels like her heart beats hard and fast at times.  Sometimes it seems irregular.  This happens more often in the morning and will last 5-10 minutes at a time.  No lightheadedness or syncope.  Patient was recently seen by Dr. Lorelei Pont who noted frequent PVCs on a rhythm strip.    ECG: NSR, normal  Current Medications (verified): 1)  Mercaptopurine 50 Mg  Tabs (Mercaptopurine) .... Take 1 Tablet By Mouth Once A Day. 2)  Estradiol   Powd (Estradiol) 3)  Norethindrone Acetate 5 Mg  Tabs (Norethindrone Acetate) .Marland Kitchen.. 1 By Mouth Qd 4)  Allergy Vaccine 1:10 Gh .... Per Schedule 5)  Nexium 40 Mg  Cpdr (Esomeprazole Magnesium) .... Take 1 Tablet By Mouth Once Daily 6)  Zoloft 50 Mg Tabs (Sertraline Hcl) .... Take 1 By Mouth Once Daily 7)  Pravastatin Sodium 40 Mg Tabs (Pravastatin Sodium) .... Take One Tablet in The Evening 8)  Miralax   Powd (Polyethylene Glycol 3350) .... As Per Prep  Instructions. 9)  Reglan 10 Mg  Tabs (Metoclopramide Hcl) .... As Per Prep Instructions. 10)  Dulcolax 5 Mg  Tbec (Bisacodyl) .... Day Before Procedure Take 2  At 3pm and 2 At 8pm.  Allergies (verified): No Known Drug Allergies  Past History:  Past Medical History: 1. B12 DEFICIENCY (ICD-266.2) 2. ANXIETY (ICD-300.00) 3. PALPITATIONS (ICD-785.1) 4. DEPRESSION (ICD-311) 5. HYPERLIPIDEMIA (ICD-272.4) 6. GERD (ICD-530.81) 7. CHOLELITHIASIS W/O CHOLECYSTITIS W/O OBST (ICD-574.20) 8. CROHN'S DISEASE, HX OF (ICD-V12.70): Followed by Dr. Olevia Perches 9. FIBROMYALGIA (ICD-729.1) 10. ALLERGIC RHINITIS (ICD-477.9) 11. Prior stress test > 5 years ago was normal per patient's report 12. Chronic low back pain    Family History: Mother died with MI at age 75. Father with CVA and a heart valve replacement.  Family History High cholesterol Family History Hypertension Family History of Stroke F 1st degree relative <60 gma with lung cancer Family History of Heart Disease: Father No FH of Colon Cancer:  Social History: Former Smoker- age 31 to 68 Alcohol use-no Illicit Drug Use - no Patient does not get regular exercise.  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   Vital Signs:  Patient profile:   50 year old female Height:      63 inches Weight:      163 pounds BMI:     28.98 Pulse rate:   71 / minute Pulse rhythm:   regular BP sitting:   142 / 90  (  left arm) Cuff size:   regular  Vitals Entered By: Doug Sou CMA (August 01, 2009 9:40 AM)  Physical Exam  General:  Well developed, well nourished, in no acute distress. Head:  normocephalic and atraumatic Nose:  no deformity, discharge, inflammation, or lesions Mouth:  Teeth, gums and palate normal. Oral mucosa normal. Neck:  Neck supple, no JVD. No masses, thyromegaly or abnormal cervical nodes. Lungs:  Clear bilaterally to auscultation and percussion. Heart:  Non-displaced PMI, chest non-tender; regular rate and rhythm, S1, S2 without murmurs, rubs or gallops. Carotid  upstroke normal, no bruit.  Pedals normal pulses. No edema, no varicosities. Abdomen:  Bowel sounds positive; abdomen soft and non-tender without masses, organomegaly, or hernias noted. No hepatosplenomegaly. Msk:  Back normal, normal gait. Muscle strength and tone normal. Extremities:  No clubbing or cyanosis. Neurologic:  Alert and oriented x 3. Skin:  Intact without lesions or rashes. Psych:  Normal affect.   Impression & Recommendations:  Problem # 1:  CHEST PAIN UNSPECIFIED (ICD-786.50) Atypical chest pain in a woman with cardiac risk factors that include hyperlipidemia and family history.  She may also be hypertensive.  She has Crohns disease which creates a state of chronic inflammation that promotes atherosclerosis.  Her chest pain may simply be GERD.  She should continue her Nexium.  I will set her up for an ETT-myoview to risk stratify.    Problem # 2:  PALPITATIONS (ICD-785.1) Patient has palpitations frequently in the morning.  She had PVCs at Dr. Lillie Fragmin office.  I suspect that the palpitiations represent premature beats.  As these happen in the morning and she drinks two cups of coffee in the morning, I would suggest that she cut back on coffee.  I will set her up for a 48 hour holter monitor.   Problem # 3:  HYPERLIPIDEMIA (HLK-562.4) We will get lipid results from Dr. Lillie Fragmin office.  Problem # 4:  ELEVATED BP BP is 142/90 and was 150/75 at Dr. Lillie Fragmin office.  She will measure her BP 2-3 times a week at home and will bring her numbers with her when she follows up.  If elevated, begin a medication.   Other Orders: EKG w/ Interpretation (93000) Nuclear Stress Test (Nuc Stress Test) Holter Monitor (Holter Monitor)  Patient Instructions: 1)  Your physician has recommended that you wear a holter monitor.  Holter monitors are medical devices that record the heart's electrical activity. Doctors most often use these monitors to diagnose arrhythmias. Arrhythmias are  problems with the speed or rhythm of the heartbeat. The monitor is a small, portable device. You can wear one while you do your normal daily activities. This is usually used to diagnose what is causing palpitations/syncope (passing out).  48 hour monitor 2)  Your physician has requested that you have an exercise stress myoview.  For further information please visit [email or web address][email or web address].  Please follow instruction sheet, as given. 3)  Take and record your blood pressure three times a week---bring the readings when you come back for your appointment with Dr Aundra Dubin. 4)  Your physician recommends that you schedule a follow-up appointment in: 2 weeks with Dr Aundra Dubin.

## 2010-07-15 ENCOUNTER — Ambulatory Visit (INDEPENDENT_AMBULATORY_CARE_PROVIDER_SITE_OTHER): Payer: BC Managed Care – PPO

## 2010-07-15 DIAGNOSIS — J301 Allergic rhinitis due to pollen: Secondary | ICD-10-CM

## 2010-07-17 ENCOUNTER — Encounter: Payer: Self-pay | Admitting: Internal Medicine

## 2010-07-22 NOTE — Miscellaneous (Signed)
Summary: Nexium Rx  Clinical Lists Changes  Medications: Changed medication from [place] 40 MG  CPDR (ESOMEPRAZOLE MAGNESIUM) Take 1 tablet by mouth once daily to NEXIUM 40 MG  CPDR (ESOMEPRAZOLE MAGNESIUM) Take 1 tablet by mouth once daily - Signed Rx of NEXIUM 40 MG  CPDR (ESOMEPRAZOLE MAGNESIUM) Take 1 tablet by mouth once daily;  #30 x 4;  Signed;  Entered by: Madlyn Frankel CMA (AAMA);  Authorized by: Lafayette Dragon MD;  Method used: Electronically to [facility] [place] [place], [place]  [address], Ph: [phone] or [phone], Fax: [phone]    Prescriptions: NEXIUM 40 MG  CPDR (ESOMEPRAZOLE MAGNESIUM) Take 1 tablet by mouth once daily  #30 x 4   Entered by:   Madlyn Frankel CMA ([place])   Authorized by:   Lafayette Dragon MD   Signed by:   [place] ([place]) on 07/17/2010   Method used:   Electronically to        [facility] (retail)       [address] [place], [place]  [address]       Ph: [phone] or [phone]       Fax: [phone]   RxID:   [id number]

## 2010-07-24 ENCOUNTER — Encounter (INDEPENDENT_AMBULATORY_CARE_PROVIDER_SITE_OTHER): Payer: BC Managed Care – PPO

## 2010-07-24 ENCOUNTER — Encounter: Payer: Self-pay | Admitting: Internal Medicine

## 2010-07-24 DIAGNOSIS — E538 Deficiency of other specified B group vitamins: Secondary | ICD-10-CM

## 2010-07-27 ENCOUNTER — Other Ambulatory Visit: Payer: Self-pay | Admitting: Obstetrics and Gynecology

## 2010-07-28 NOTE — Assessment & Plan Note (Signed)
Summary: MONTHLY B12/YF  Nurse Visit   Allergies: 1)  ! * Remicade  Medication Administration  Injection # 1:    Medication: Vit B12 1000 mcg    Diagnosis: Hx of B12 DEFICIENCY (ICD-266.2)    Route: IM    Site: L deltoid    Exp Date: 03/2012    Lot #: [id number]    Mfr: [place]    Patient tolerated injection without complications    Given by: Genella Mech CMA Deborra Medina) (July 24, 2010 3:58 PM)  Orders Added: 1)  Vit B12 1000 mcg [F2591]

## 2010-08-06 ENCOUNTER — Other Ambulatory Visit: Payer: Self-pay | Admitting: Obstetrics and Gynecology

## 2010-08-11 ENCOUNTER — Ambulatory Visit: Payer: Self-pay | Admitting: Internal Medicine

## 2010-08-21 ENCOUNTER — Ambulatory Visit (INDEPENDENT_AMBULATORY_CARE_PROVIDER_SITE_OTHER): Payer: BC Managed Care – PPO | Admitting: Internal Medicine

## 2010-08-21 DIAGNOSIS — E538 Deficiency of other specified B group vitamins: Secondary | ICD-10-CM

## 2010-08-21 MED ORDER — CYANOCOBALAMIN 1000 MCG/ML IJ SOLN
1000.0000 ug | Freq: Once | INTRAMUSCULAR | Status: DC
  Administered 2010-08-21: 1000 ug via INTRAMUSCULAR

## 2010-08-21 MED ORDER — CYANOCOBALAMIN 1000 MCG/ML IJ SOLN
1000.0000 ug | INTRAMUSCULAR | Status: DC
  Administered 2010-09-24 – 2010-10-15 (×2): 1000 ug via INTRAMUSCULAR

## 2010-09-01 ENCOUNTER — Other Ambulatory Visit: Payer: Self-pay | Admitting: Obstetrics and Gynecology

## 2010-09-07 ENCOUNTER — Telehealth: Payer: Self-pay | Admitting: Cardiology

## 2010-09-07 NOTE — Telephone Encounter (Signed)
Spoke with pharmacy they state she needs Chlorthalidone and potasium refilled i gave pt 3 extra refills for these meds

## 2010-09-15 ENCOUNTER — Encounter: Payer: Self-pay | Admitting: Internal Medicine

## 2010-09-17 ENCOUNTER — Encounter: Payer: Self-pay | Admitting: Internal Medicine

## 2010-09-17 ENCOUNTER — Ambulatory Visit (INDEPENDENT_AMBULATORY_CARE_PROVIDER_SITE_OTHER): Payer: BC Managed Care – PPO | Admitting: Internal Medicine

## 2010-09-17 VITALS — BP 122/80 | HR 61 | Ht 63.0 in | Wt 137.4 lb

## 2010-09-17 DIAGNOSIS — J309 Allergic rhinitis, unspecified: Secondary | ICD-10-CM

## 2010-09-17 DIAGNOSIS — J301 Allergic rhinitis due to pollen: Secondary | ICD-10-CM

## 2010-09-17 MED ORDER — EPINEPHRINE 0.15 MG/0.3ML IJ DEVI: 0.1500 mg | INTRAMUSCULAR | Status: AC | PRN

## 2010-09-17 NOTE — Progress Notes (Signed)
  Subjective:    Patient ID: [name], female    DOB: May 23, 1961, 50 y.o.   MRN: [id number]  HPI 50 yoF followed for allergic rhinitis. It works very well for her to get her  Allergy shots from nurse at work with no problems. She has done well through the Spring tree pollen season so far. This is not a bad season. 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    Review of Systems Constitutional:   No weight loss, night sweats,  Fevers, chills, fatigue, lassitude. HEENT:   No headaches,  Difficulty swallowing,  Tooth/dental problems,  Sore throat,                Little  sneezing, itching, ear ache, nasal congestion, post nasal drip,   CV:  No chest pain,  Orthopnea, PND, swelling in lower extremities, anasarca, dizziness, palpitations  GI  No heartburn, indigestion, abdominal pain, nausea, vomiting, diarrhea, change in bowel habits, loss of appetite  Resp: No shortness of breath with exertion or at rest.  No excess mucus, no productive cough,  No non-productive cough,  No coughing up of blood.  No change in color of mucus.  No wheezing. Skin: no rash or lesions.  GU: no dysuria, change in color of urine, no urgency or frequency.  No flank pain.  MS:  No joint pain or swelling.  No decreased range of motion.  No back pain.  Psych:  No change in mood or affect. No depression or anxiety.  No memory loss.      Objective:   Physical Exam General- Alert, Oriented, Affect-appropriate, Distress- none acute  Medium build  Skin- rash-none, lesions- none, excoriation- none  Lymphadenopathy- none  Head- atraumatic  Eyes- Gross vision intact, PERRLA, conjunctivae clear, secretions  Ears- Hearing normal canals, Tm L ,   R ,  Nose- Clear, No- Septal dev, mucus, polyps, erosion, perforation   Throat- Mallampati II , mucosa clear , drainage- none, tonsils- atrophic  Neck- flexible , trachea  midline, no stridor , thyroid nl, carotid no bruit  Chest - symmetrical excursion , unlabored     Heart/CV- RRR , no murmur , no gallop  , no rub, nl s1 s2                     - JVD- none , edema- none, stasis changes- none, varices- none     Lung- clear to P&A, wheeze- none, cough- none , dullness-none, rub- none     Chest wall- Abd- tender-no, distended-no, bowel sounds-present, HSM- no  Br/ Gen/ Rectal- Not done, not indicated  Extrem- cyanosis- none, clubbing, none, atrophy- none, strength- nl  Neuro- grossly intact to observation         Assessment & Plan:

## 2010-09-17 NOTE — Patient Instructions (Signed)
Continue allergy vaccine.  Refill script for Epipen to keep available where you get your allergy vaccine, in the rare case of a severe reaction to your allergy shot.

## 2010-09-20 ENCOUNTER — Encounter: Payer: Self-pay | Admitting: Internal Medicine

## 2010-09-20 NOTE — Assessment & Plan Note (Signed)
She indicates she is well satisfied with her status and we see no reason to change. Symptomatic meds- antihistamines, decongestants, nasal sprays- reviewed.

## 2010-09-24 ENCOUNTER — Ambulatory Visit (INDEPENDENT_AMBULATORY_CARE_PROVIDER_SITE_OTHER): Payer: BC Managed Care – PPO | Admitting: Internal Medicine

## 2010-09-24 DIAGNOSIS — E538 Deficiency of other specified B group vitamins: Secondary | ICD-10-CM

## 2010-10-05 ENCOUNTER — Other Ambulatory Visit: Payer: Self-pay | Admitting: *Deleted

## 2010-10-05 MED ORDER — POTASSIUM CHLORIDE CRYS ER 20 MEQ PO TBCR: 20.0000 meq | EXTENDED_RELEASE_TABLET | Freq: Every day | ORAL | Status: DC

## 2010-10-13 NOTE — Assessment & Plan Note (Signed)
[facility]                             PULMONARY OFFICE NOTE   NAME:[name], [name] PLACIDE                         MRN:          [id number]  DATE:09/30/2006                            DOB:          Jan 02, 1961    PROBLEM LIST:  1. Allergic rhinitis with rhinosinusitis.  2. Fibromyalgia.  3. Crohn's disease.   HISTORY OF PRESENT ILLNESS:  She feels stable. Allergy vaccine is  definitely helping, as she compares pollen symptoms to previous years  but she does admit significant reflux complaints, for which she is  seeing Dr. Olevia Perches. Astelin nasal spray was no help and she is sneezing.  She continues allergy vaccine at 1 to 10.   MEDICATIONS:  Mercaptopurine, allergy vaccine at 1 to 10, Nasacort AQ,  Xanax 0.25 mg b.i.d. p.r.n.   ALLERGIES:  NO KNOWN DRUG ALLERGIES.   OBJECTIVE:  VITAL SIGNS:  Weight 132 pounds, blood pressure 98/60, pulse  regular at 70, room air saturation 98%.  HEENT:  Watery sniffing. Her pharynx is clear. Voice quality normal.  LUNGS:  Clear. No cough or wheeze.  HEART:  Sounds regular without murmur.   IMPRESSION:  Allergic rhinitis.   PLAN:  Continue vaccine at 1 to 10. Try Singulair 10 mg daily with  Sudafed-PE. We discussed reflux precautions and she will followup with  Dr. [name] but in the meantime, take Pepcid AC. Schedule return in  4 months, earlier p.r.n.     Clinton D. Annamaria Boots, MD, [place], FACP  Electronically Signed    CDY/MedQ  DD: 10/02/2006  DT: 10/02/2006  Job #: [id number]   cc:   Freda Munro, M.D.  Reginia Forts, M.D.  Lindaann Slough, M.D.

## 2010-10-13 NOTE — Assessment & Plan Note (Signed)
[facility]                         GASTROENTEROLOGY OFFICE NOTE   NAME:Ellers, MINYON BILLITER                         MRN:          [id number]  DATE:09/04/2007                            DOB:          09-14-1960    The patient is a 50 year old white female with Crohn's disease of the  terminal ileum since at least 1991.  She is an ex-smoker.  She has  gastroesophageal reflux disease, sacroiliitis, fibromyalgia, and  hyperlipidemia.  Her operations include tubal ligation.  She has sludge  in the gallbladder on ultrasound in 1993.  Her last colonoscopy in March  2006, last upper endoscopy was in 2001.   She is here today because of severe epigastric pain and substernal chest  pain radiating between the scapula.  It is worse when she eats and has  been hurting her constantly for the past several days, more so in the  last 2 days.  There has been no fever, jaundice, vomiting, or diarrhea.   MEDICATIONS:  1. 6-MP 50 mg p.o. daily.  2. Estradiol.  3. Allergy vaccine.  4. Norethin.  5. Nexium 40 mg daily.  6. Xanax 0.25 mg p.r.n.   PHYSICAL EXAMINATION:  VITAL SIGNS:  Blood pressure 142/84, pulse 72,  and weight 137 pounds.  GENERAL:  She was in no distress.  LUNGS:  Clear to auscultation.  HEART:  Normal S1 and S2.  ABDOMEN:  Mildly protuberant and mildly tender in the epigastrium and  also along the right costal margin.  Liver edge was at costal margin.  Bowel sounds were normal active.  There was no lower abdominal  tenderness.  RECTAL:  Not done.   IMPRESSION:  1. A 50 year old white female with severe substernal chest pain such      as typical for esophageal spasm or possible gastroesophageal      reflux, rule out symptomatic gallbladder disease.  There is a      question of gallbladder sludge or stones on prior images from at      least 15 years ago.  2. Crohn's disease currently under good control.  Last CT scans      confirms thickening of the  terminal ileum.  3. Ex-smoker.  4. Fibromyalgia.   PLAN:  1. Increase Nexium to 40 mg p.o. b.i.d.  2. Abdominal ultrasound.  If positive we will proceed with HIDA scan.  3. Upper endoscopy scheduled.  4. Darvocet-N 100 p.r.n. pain.  5. CMET, amylase, and lipase today.     Lowella Bandy. Olevia Perches, MD  Electronically Signed    DMB/MedQ  DD: 09/04/2007  DT: 09/04/2007  Job #: [id number]   cc:   Richardson Landry A. Everlene Farrier, M.D.

## 2010-10-13 NOTE — Assessment & Plan Note (Signed)
[facility]                         GASTROENTEROLOGY OFFICE NOTE   NAME:Oxley, DYMOND GUTT                         MRN:          [id number]  DATE:01/13/2007                            DOB:          [birth year]    Ms. Pung is a 50 year old white female with Crohn's disease of the  terminal ileum.  Last colonoscopy in March 2006, confirmed presence of  active Chron's disease at the ileocecal valve.  She has been on 6-  Mercaptopurine 50 mg daily with good control of her symptoms.  She  denies diarrhea or abdominal pain.  Her weight has been stable.   Other problems have been:  1. Cholelithiasis.  2. Sacroiliitis.  3. Fibromyalgia.  4. Hyperlipidemia.  5. She is a smoker.   MEDICATIONS:  1. 6-Mercaptopurine 50 mg p.o. daily.  2. Estradiol and progesterone daily.  3. Ambien 10 mg p.r.n.  4. Xanax 0.25 mg p.r.n.   PHYSICAL EXAMINATION:  VITAL SIGNS:  Blood pressure 108/54, pulse 66,  and weight 130 pounds.  GENERAL:  She is alert, oriented, in no distress.  LUNGS:  Clear to auscultation.  COR:  Normal S1, normal S2.  ABDOMEN:  Soft with minimal tenderness in the right lower quadrant.  Normoactive bowel sounds.  No distention.  RECTAL:  Not done.  EXTREMITIES:  No edema.   IMPRESSION:  1. A 50 year old white female with Crohn's disease of the ileocecal      valve, currently under good control on 6-Mercaptopurine 50 mg      daily.  2. History of B12 deficiency.  The patient is on B12 supplement.   PLAN:  1. Continue the same medications.  2. A lipid profile next week.  3. CBC every 6 months, the last one recently showed hemoglobin of      13.3, hematocrit 39.8, with a platelet count of 419,000, white cell      count 10,300.     Lowella Bandy. Olevia Perches, MD  Electronically Signed    DMB/MedQ  DD: 01/13/2007  DT: 01/13/2007  Job #: [id number]   cc:   Richardson Landry A. Everlene Farrier, M.D.

## 2010-10-13 NOTE — Assessment & Plan Note (Signed)
[facility]                             PULMONARY OFFICE NOTE   NAME:[name], [name] REMBERT                         MRN:          [id number]  DATE:01/31/2007                            DOB:          [birth year]    PULMONARY FOLLOW-UP:   PROBLEMS:  1. Allergic rhinosinusitis.  2. Fibromyalgia.  3. Crohn's.   HISTORY:  Was already scheduled for a visit.  Last night had rather  sudden onset of nasal congestion after moving a washer and dryer, which  stirred up a lot of dust as she worked behind them.  Now nasal  congestion, beginning to hurt when she sneezes.  No discharge yet.  No  fever.  Chest feels okay.   MEDICATIONS:  1. She continues allergy vaccine at 1:10.  2. 6-mercaptopurine.  3. Norethin.  4. Xanax p.r.n.  5. She has saline nasal spray.   OBJECTIVE:  Weight 132 pounds.  Blood pressure 134/78, pulse 60, room  air saturation 99%.  Nasal mucosa is reddened.  She denies use of decongestant nose sprays.  I did not ask about drug use.  There is moderate nasal congestion, some  mucoid bridging.  I do not feel polyps.  The pharynx is clear.  Voice  quality normal.  Heart sounds regular without murmur.   IMPRESSION:  Rhinitis with exacerbation.  Question of gastroesophageal  reflux disease in addition to her other digestive disorders.   PLAN:  1. Nasal nebulizer treatment with Neo-Synephrine.  2. Depo-Medrol 80 mg a day and with steroid talk.  3. Return 4 months, earlier p.r.n. failure to clear.     Clinton D. Annamaria Boots, MD, [facility], [place]  Electronically Signed    CDY/MedQ  DD: 01/31/2007  DT: 02/01/2007  Job #: [id number]   cc:   Richardson Landry A. Everlene Farrier, M.D.

## 2010-10-15 ENCOUNTER — Ambulatory Visit (INDEPENDENT_AMBULATORY_CARE_PROVIDER_SITE_OTHER): Payer: BC Managed Care – PPO | Admitting: Internal Medicine

## 2010-10-15 DIAGNOSIS — E538 Deficiency of other specified B group vitamins: Secondary | ICD-10-CM

## 2010-10-16 NOTE — Assessment & Plan Note (Signed)
[name]                               PULMONARY OFFICE NOTE   NAME:[name], [name] SINGLETERRY                         MRN:          [id number]  DATE:04/20/2006                            DOB:          [birth year]    HISTORY OF PRESENT ILLNESS:  This is a 50 year old white female patient of  Dr. Annamaria Boots.  She has a known history of allergic rhinitis.  The patient  presents for an acute office visit.  The patient complains, over the last  two days of nasal congestion, sinus pain and pressure.  She denies any  colored sputum, fever, chest pain, shortness of breath, cough or wheezing.   PAST MEDICAL HISTORY:  Reviewed.   CURRENT MEDICATIONS:  Reviewed.   PHYSICAL EXAMINATION:  GENERAL:  The patient is a pleasant female, in no  acute distress.  She is afebrile with stable vital signs.  O2 saturation is  98% on room air.  HEENT:  Nasal mucosa is erythematous and swollen.  Nontender sinuses to  pressure.  Tympanic membranes are clear.  NECK:  The neck is supple without adenopathy, no JVD.  LUNGS:  Her lung sounds are clear.  CARDIAC:  Regular rate and rhythm.  ABDOMEN:  Soft, benign.  EXTREMITIES:  Warm, without any edema.   IMPRESSION/PLAN:  Acute rhinitis and probable upper respiratory infection.  The patient is to begin Mucinex twice daily.  May use Tylenol Cold and Sinus  p.r.n.  A nasal hygiene regimen with Nasacort, Afrin and saline nasal spray.  Instruction sheet given.  The patient was given the antibiotic, Augmentin,  times ten days to have on hold, if symptoms worsen after five to seven days.  The patient is to return back with Dr. Annamaria Boots as scheduled, or sooner if need  be.      Rexene Edison, NP  Electronically Signed      Clinton D. Annamaria Boots, MD, [facility], [place]  Electronically Signed   TP/MedQ  DD: 04/20/2006  DT: 04/20/2006  Job #: [id number]

## 2010-10-16 NOTE — Assessment & Plan Note (Signed)
[facility]                               PULMONARY OFFICE NOTE   NAME:[name], [name]                         MRN:          [id number]  DATE:03/17/2006                            DOB:          1960/10/22    PROBLEM:  1. Allergic rhinitis with rhinosinusitis.  2. Fibromyalgia.  3. Crohn's disease.   HISTORY:  She is at 1:50 on her allergy vaccine, getting injections here  without problems.  Just in the last 2 or 3 days, she has had increased nasal  congestion on the right.  She says she is distinctly better for a few days  after each allergy shot, and she thinks that she would be better off if she  got her shots twice a week.  I discussed management of the vaccine.  She has  had a little cough, some right frontal headache, no real discharge, no  fever, nothing purulent.  She has been using either the Sudafed or Tylenol  Sinus, which would have phenylephrine in it by now.  She sometimes wears a  mask to dust, and is using saline nasal spray.   MEDICATIONS:  Femhrt, allergy vaccine, mercaptopurine, Nasacort AQ, Mucinex.   ALLERGIES:  No medication allergy.   OBJECTIVE:  Weight 118 pounds, BP 124/80, pulse regular 72, room air  saturation 97%.  There is a little turbinate edema on the right naris, with little mucus on  either side.  Conjunctivae are not injected.  There is no adenopathy or  postnasal drip.  Oropharynx is not inflamed.  LUNGS:  Sound clear.  Heart sounds are regular.   IMPRESSION:  Exacerbation of rhinitis.  I am not sure if she is seeing a  real pattern of acute variation with her allergy vaccine, or just  coincidence at this point, and we will watch it longer.   PLAN:  1. Nasal nebulizer treatment with Neo-Synephrine.  2. Depo-Medrol 80 mg intramuscular with steroid talk.  3. Sample Astelin with discussion, once each nostril up to b.i.d. p.r.n.  4. Schedule return 4 months, considering advancing vaccine to 1:10 at that  time.       Clinton D. Annamaria Boots, MD, [facility], FACP      CDY/MedQ  DD:  03/17/2006  DT:  03/20/2006  Job #:  [id number]   cc:   Freda Munro, M.D.  Reginia Forts, M.D.  Lindaann Slough, M.D.

## 2010-10-16 NOTE — Assessment & Plan Note (Signed)
[facility]                             PULMONARY OFFICE NOTE   NAME:[name], [name]                         MRN:          [id number]  DATE:06/27/2006                            DOB:          14-Oct-1960    HISTORY OF PRESENT ILLNESS:  The patient is a 50 year old white female  patient of Dr. Annamaria Boots with a known history of allergic rhinitis, returns  to the office related to persistent cough, nasal congestion, and  intermittent wheezing.  The patient had presented to the office 5 days  ago for suspected upper respiratory infection and recommended to use  over-the-counter Mucinex and a nasal hydro regimen with saline and  Azacort AQ.  The patient was given an Augmentin prescription to have on  hold in case symptoms did not improve.  She does report that she filled  that and is currently on day 4 of 7.  The patient returns today  complaining that she is not feeling any better.  She denies any chest  pain, orthopnea, PND or leg swelling.   Past medical history is reviewed, current medications reviewed.   PHYSICAL EXAMINATION:  The patient is a pleasant female in no acute  distress.  She is afebrile with stable vital signs.  O2 saturation is  97% on room air.  HEENT:  Nasal mucosa is erythematous.  Nontender maxillary sinus.  Posterior pharynx is clear.  NECK:  Supple, without adenopathy.  No JVD.  LUNG SOUNDS:  Reveal some coarse breath sounds with a few expiratory  wheezes.  CARDIAC:  Regular rate and rhythm.  ABDOMEN:  Soft.  EXTREMITIES:  Without edema.   ASSESSMENT AND PLAN:  Acute upper respiratory infection with probable  early sinusitis.  The patient is to begin a prednisone taper over the  next week.  Continue on Mucinex DM.  The patient is to finish Augmentin  for a total of 7 day course.  She was given a prescription for an  additional 7 days in case symptoms do not improve.  The patient will  return back with Dr. Annamaria Boots as scheduled or sooner  if needed.      Rexene Edison, NP  Electronically Signed      Clinton D. Annamaria Boots, MD, [facility], FACP  Electronically Signed   TP/MedQ  DD: 06/28/2006  DT: 06/28/2006  Job #: [id number]

## 2010-10-16 NOTE — Assessment & Plan Note (Signed)
[facility]                             PULMONARY OFFICE NOTE   NAME:[name], [name]                         MRN:          [id number]  DATE:07/15/2006                            DOB:          Apr 19, 1961    PROBLEMS:  1. Allergic rhinitis with rhinosinusitis.  2. Fibromyalgia.  3. Crohn's disease.   HISTORY:  Since I last saw her in October she has seen the nurse  practitioner 3 times for quick work-ins for cough and nasal congestion.  She was treated with Mucinex, Nasacort AQ and extended course of  Augmentin and a prednisone taper.  She says today she feels pretty much  well.  She had also sought treatment at [facility],  and had gotten a Z-Pak and cough syrup.  We discussed medications and  management.  There has been very little chest congestion.  She is using  a room humidifier, has had flu vaccine and continue her allergy vaccine  at 1:50 with no problems.   MEDICATIONS:  Allergy vaccine, mercaptopurine, Estradiol, Mucinex,  Nasacort AQ, Xanax.   ALLERGIES:  No medication allergy.   We discussed her immunosuppressive therapy as possibly contributing to  her tendency towards recurrent respiratory infections, and we also  discussed allergy management, environmental precautions.   OBJECTIVE:  Weight 136 pounds, BP 120/68, pulse regular 80, room air  saturation 96%.  Wet nose and some turbinate edema, but no mucus bridging, polyps or  postnasal drip seen, nothing purulent.  Throat is not red, there is no adenopathy.  Voice quality is normal.  CHEST:  Clear without cough or wheeze.   IMPRESSION:  Rhinitis and bronchitis, recurrent versus low-grade  chronic.   PLAN:  She is going to retry a sample of Astelin nasal spray once each  nostril b.i.d. p.r.n., try benzonatate Perles 100 mg q. 6h p.r.n. for  cough, try nasal saline gel and nasal saline lavage.  We will increase her allergy vaccine strength to 1:10.  Schedule  return  in 3 months, but earlier p.r.n.     Clinton D. Annamaria Boots, MD, [facility], FACP  Electronically Signed    CDY/MedQ  DD: 07/15/2006  DT: 07/16/2006  Job #: [id number]   cc:   Freda Munro, M.D.  Reginia Forts, M.D.  Lindaann Slough, M.D.

## 2010-10-16 NOTE — Assessment & Plan Note (Signed)
[facility]                             PULMONARY OFFICE NOTE   NAME:[name], [name] KRATKY                         MRN:          [id number]  DATE:06/22/2006                            DOB:          [birth year]    HISTORY OF PRESENT ILLNESS:  The patient is a 50 year old white female  patient of Dr. Janee Morn who has a known history of allergic rhinitis on  allergy vaccines 1:50 weekly given in our allergy clinic.  The patient  presents today for an acute office visit complaining of a 4-day history  of nasal congestion, sinus pain and pressure, productive cough with  thick yellow sputum, and chills.  The patient has just been using some  Mucinex and allergy cold and sinus tablets over-the-counter without much  relief.  She denies any hemoptysis, orthopnea, PND or leg swelling.   PAST MEDICAL HISTORY:  Reviewed.   CURRENT MEDICATIONS:  Reviewed.   PHYSICAL EXAMINATION:  GENERAL:  The patient is a pleasant female in no  acute distress.  VITAL SIGNS:  She is afebrile with stable vital signs.  O2 saturation is  98% on room air.  HEENT:  Nasal mucosa is erythematous and swollen, increased on the  right.  Maxillary sinus tenderness.  NECK:  Supple without adenopathy.  No JVD.  LUNG SOUNDS:  Clear.  CARDIAC:  Regular rate.  ABDOMEN:  Soft and nontender.  EXTREMITIES:  Warm without any edema.   IMPRESSION AND PLAN:  Acute upper respiratory infection.  The patient is  to continue on Mucinex twice daily along with saline nasal spray and  Nasacort AQ.  The patient was given a nebulized nasal decongestant in  the office today.  The patient was given a prescription for Augmentin to  have on hold if symptoms do not improve or worsen over the next 2-3  days.  She does have followup with Dr. Annamaria Boots next week and follow up  accordingly or sooner if needed.      Rexene Edison, NP  Electronically Signed      Clinton D. Annamaria Boots, MD, [facility], FACP  Electronically  Signed   TP/MedQ  DD: 06/22/2006  DT: 06/22/2006  Job #: [id number]

## 2010-11-16 ENCOUNTER — Other Ambulatory Visit: Payer: Self-pay | Admitting: Internal Medicine

## 2010-11-16 ENCOUNTER — Telehealth: Payer: Self-pay | Admitting: *Deleted

## 2010-11-16 ENCOUNTER — Other Ambulatory Visit (INDEPENDENT_AMBULATORY_CARE_PROVIDER_SITE_OTHER): Payer: BC Managed Care – PPO

## 2010-11-16 DIAGNOSIS — E538 Deficiency of other specified B group vitamins: Secondary | ICD-10-CM

## 2010-11-16 NOTE — Telephone Encounter (Signed)
Message copied by Hulan Saas on Mon Nov 16, 2010  8:46 AM ------      Message from: Hulan Saas      Created: Wed Aug 05, 2010 11:36 AM       Call and remind patient Vit B 12 level due this week

## 2010-11-16 NOTE — Telephone Encounter (Signed)
Left a message for patient to have Vit B 12 level drawn this week.

## 2010-11-17 ENCOUNTER — Telehealth: Payer: Self-pay | Admitting: *Deleted

## 2010-11-17 MED ORDER — CYANOCOBALAMIN 1000 MCG/ML IJ SOLN: 1000.0000 ug | INTRAMUSCULAR | Status: DC

## 2010-11-17 NOTE — Progress Notes (Signed)
See phone note

## 2010-11-17 NOTE — Telephone Encounter (Signed)
Advised patient that b12 is at lower end of normal. Asked her to continue her b12 injections every month. Patient would like to have script sent to her pharmacy as one of her friends is a Marine scientist and already administers allergy injections to her. I will send a prescription to the pharmacy for b12 and we will recheck in 6 months.

## 2010-11-17 NOTE — Telephone Encounter (Signed)
Message copied by Larina Bras on Tue Nov 17, 2010  3:57 PM ------      Message from: Lafayette Dragon      Created: Tue Nov 17, 2010 12:57 PM       Please call pt with low normal B12 levels, continue monthly B12

## 2010-11-30 ENCOUNTER — Ambulatory Visit (INDEPENDENT_AMBULATORY_CARE_PROVIDER_SITE_OTHER): Payer: BC Managed Care – PPO

## 2010-11-30 DIAGNOSIS — J309 Allergic rhinitis, unspecified: Secondary | ICD-10-CM

## 2010-12-01 ENCOUNTER — Encounter: Payer: Self-pay | Admitting: Internal Medicine

## 2010-12-03 ENCOUNTER — Other Ambulatory Visit: Payer: Self-pay | Admitting: *Deleted

## 2010-12-03 MED ORDER — MERCAPTOPURINE 50 MG PO TABS: 50.0000 mg | ORAL_TABLET | Freq: Every day | ORAL | Status: DC

## 2010-12-03 NOTE — Telephone Encounter (Signed)
rx sent

## 2011-01-11 ENCOUNTER — Other Ambulatory Visit: Payer: Self-pay | Admitting: Obstetrics and Gynecology

## 2011-03-05 ENCOUNTER — Other Ambulatory Visit: Payer: Self-pay | Admitting: *Deleted

## 2011-03-05 MED ORDER — MERCAPTOPURINE 50 MG PO TABS: 50.0000 mg | ORAL_TABLET | Freq: Every day | ORAL | Status: DC

## 2011-03-18 ENCOUNTER — Encounter: Payer: Self-pay | Admitting: *Deleted

## 2011-03-18 ENCOUNTER — Ambulatory Visit (INDEPENDENT_AMBULATORY_CARE_PROVIDER_SITE_OTHER): Payer: BC Managed Care – PPO | Admitting: Cardiology

## 2011-03-18 DIAGNOSIS — R0989 Other specified symptoms and signs involving the circulatory and respiratory systems: Secondary | ICD-10-CM

## 2011-03-18 DIAGNOSIS — E785 Hyperlipidemia, unspecified: Secondary | ICD-10-CM

## 2011-03-18 DIAGNOSIS — R0609 Other forms of dyspnea: Secondary | ICD-10-CM

## 2011-03-18 DIAGNOSIS — Z789 Other specified health status: Secondary | ICD-10-CM

## 2011-03-18 DIAGNOSIS — Z9189 Other specified personal risk factors, not elsewhere classified: Secondary | ICD-10-CM

## 2011-03-18 DIAGNOSIS — E7800 Pure hypercholesterolemia, unspecified: Secondary | ICD-10-CM

## 2011-03-18 DIAGNOSIS — R002 Palpitations: Secondary | ICD-10-CM

## 2011-03-18 MED ORDER — ATORVASTATIN CALCIUM 20 MG PO TABS: 20.0000 mg | ORAL_TABLET | Freq: Every day | ORAL | Status: DC

## 2011-03-18 NOTE — Patient Instructions (Addendum)
Stop pravastatin.  Start atorvastatin 3m daily.  Your physician recommends that you have  a FASTING lipid profile / liver profile in 2 months 786.09  272.0--you have the order.  Your physician wants you to follow-up in: 6 months with Dr MAundra Dubin (April 2013). You will receive a reminder letter in the mail two months in advance. If you don't receive a letter, please call our office to schedule the follow-up appointment.

## 2011-03-19 NOTE — Assessment & Plan Note (Signed)
Given diabetes and family history of CAD, I would like to see her LDL at least < 100.  I will have her stop pravastatin and start atorvastatin 20 mg daily with lipids/LFTs in 2 months.

## 2011-03-19 NOTE — Assessment & Plan Note (Signed)
Minimal recently, likely from occasional PVCs.

## 2011-03-19 NOTE — Progress Notes (Addendum)
PCP: Harrison Mons  50 yo with history of Crohns disease, GERD, and hyperlipidemia returns for evaluation of coronary artery disease risk. Patient has somewhat diffuse pain related to her fibromyalgia but has had no exertional chest pain or discomfort.  ETT-myoview in 3/11 showed no evidence for ischemia or infarction and normal EF.  She continues to have occasional palpitations.  Holter monitor in 2011 showed occasional PVCs which I suspect are the cause of her symptoms. Blood pressure is under good control.  Patient reports occasional dyspnea with heavy exertion but in general has good exercise tolerance.  Patient feels like she fatigues more easily than in the past. She does have a strong family history of CAD and her brother recently had CABG (he was 30).   Labs (3/11): K 3.7, creatinine 0.7  Labs (10/12): LDL 118, HDL 29, TGs 188  ECG: NSR, inferior T wave flattening  Allergies (verified):  1) ! * Remicade   Past Medical History:  1. B12 DEFICIENCY (ICD-266.2)  2. ANXIETY (ICD-300.00)  3. PALPITATIONS (ICD-785.1)  4. DEPRESSION (ICD-311)  5. HYPERLIPIDEMIA (ICD-272.4)  6. GERD (ICD-530.81)  7. CHOLELITHIASIS W/O CHOLECYSTITIS W/O OBST (ICD-574.20)  8. CROHN'S DISEASE, HX OF (ICD-V12.70): Followed by Dr. Olevia Perches  9. FIBROMYALGIA (ICD-729.1)  10. ALLERGIC RHINITIS (ICD-477.9)  11. Atypical chest pain: ETT-myoview (3/11) with EF 67%, hypertensive BP response (234/58), normal perfusion images.  12. Chronic low back pain  13. Palpitations: Holter (3/11) showed occasional PVCs.  14. HTN  15. Type II diabetes: Diet-controlled.   Family History:   Mother died with MI at age 75. Father with CVA and a heart valve replacement.  Brother with CABG at 57.  Nephew with CVA at 76.  Sister with MI at 37.  Family History High cholesterol  Family History Hypertension  No FH of Colon Cancer  Social History:  Former Smoker- age 63 to 75  Alcohol use-no  Illicit Drug Use - no  Patient does not  get regular exercise.  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   Current Outpatient Prescriptions  Medication Sig Dispense Refill  . aspirin 81 MG chewable tablet Chew 81 mg by mouth daily.        . Black Cohosh 40 MG TABS Take by mouth daily.        . cyanocobalamin (,VITAMIN B-12,) 1000 MCG/ML injection Inject 1 mL (1,000 mcg total) into the muscle every 30 (thirty) days.  6 mL  0  . Docosahexaenoic Acid (DHA ALGAL-900 PO) Take by mouth as needed.        . enalapril (VASOTEC) 5 MG tablet Take 5 mg by mouth daily.        Marland Kitchen EPINEPHrine (EPIPEN JR) 0.15 MG/0.3ML injection Inject 0.3 mLs (0.15 mg total) into the muscle as needed (for severe allergic reaction).  1 each  prn  . esomeprazole (NEXIUM) 40 MG capsule Take 40 mg by mouth daily as needed.       . fluticasone (FLONASE) 50 MCG/ACT nasal spray 2 sprays by Nasal route daily.        Marland Kitchen ibuprofen (ADVIL,MOTRIN) 600 MG tablet Take 600 mg by mouth every 8 (eight) hours as needed.        . mercaptopurine (PURINETHOL) 50 MG tablet Take 1 tablet (50 mg total) by mouth daily.  30 tablet  2  . sertraline (ZOLOFT) 50 MG tablet Take 75  mg by mouth daily.       Marland Kitchen zolpidem (AMBIEN) 10 MG tablet Take 10 mg by mouth at bedtime as needed.        Marland Kitchen atorvastatin (LIPITOR) 20 MG tablet Take 1 tablet (20 mg total) by mouth daily.  30 tablet  3    BP 126/78  Pulse 74  Ht 5' 2.5" (1.588 m)  Wt 136 lb (61.689 kg)  BMI 24.48 kg/m2 General: NAD Neck: No JVD, no thyromegaly or thyroid nodule.  Lungs: Clear to auscultation bilaterally with normal respiratory effort. CV: Nondisplaced PMI.  Heart regular S1/S2, no S3/S4, no murmur.  No peripheral edema.  No carotid bruit.  Normal pedal pulses.  Abdomen: Soft, nontender, no hepatosplenomegaly, no distention.   Neurologic: Alert and oriented x 3.  Psych: Normal affect. Extremities: No clubbing or cyanosis.

## 2011-03-19 NOTE — Assessment & Plan Note (Signed)
Patient has multiple CAD risk factors: family history, DM, HTN, and hyperlipidemia.  She has no significant exertional symptoms.  ETT-myoview in 3/11 was low risk.  She should continue ASA 81 and statin aiming for LDL at least < 100.

## 2011-04-16 ENCOUNTER — Ambulatory Visit (INDEPENDENT_AMBULATORY_CARE_PROVIDER_SITE_OTHER): Payer: BC Managed Care – PPO

## 2011-04-16 DIAGNOSIS — J309 Allergic rhinitis, unspecified: Secondary | ICD-10-CM

## 2011-04-26 ENCOUNTER — Telehealth: Payer: Self-pay | Admitting: *Deleted

## 2011-04-26 DIAGNOSIS — R0600 Dyspnea, unspecified: Secondary | ICD-10-CM

## 2011-04-26 MED ORDER — METOPROLOL TARTRATE 50 MG PO TABS: ORAL_TABLET | ORAL | Status: DC

## 2011-04-26 NOTE — Telephone Encounter (Signed)
I talked with pt. She is aware Dr Aundra Dubin wants her to take lopressor 53m two hours prior to the CCTA.

## 2011-04-26 NOTE — Telephone Encounter (Signed)
Pt returned your call. Please call her at [phone]

## 2011-04-26 NOTE — Telephone Encounter (Signed)
Per Dr Dewitt Rota to be scheduled for a coronary CTA for ongoing exertional dyspnea. Please schedule on Thursday Nov 29,2012.  Pt to take lopressor 48m two hours before the CTA .

## 2011-04-26 NOTE — Telephone Encounter (Signed)
LMTCB

## 2011-04-27 ENCOUNTER — Telehealth: Payer: Self-pay | Admitting: Cardiology

## 2011-04-27 ENCOUNTER — Encounter: Payer: Self-pay | Admitting: Cardiology

## 2011-04-27 DIAGNOSIS — R0600 Dyspnea, unspecified: Secondary | ICD-10-CM

## 2011-04-27 NOTE — Telephone Encounter (Signed)
Ivin Booty is working on this. I will forward to [facility] to follow-up with pt.

## 2011-04-27 NOTE — Telephone Encounter (Signed)
New message:  Patient would like to know about her test/time/date/etc.  Please call her.

## 2011-04-28 ENCOUNTER — Telehealth: Payer: Self-pay | Admitting: Cardiology

## 2011-04-28 MED ORDER — METOPROLOL TARTRATE 50 MG PO TABS: ORAL_TABLET | ORAL | Status: DC

## 2011-04-28 NOTE — Telephone Encounter (Signed)
caled pharmacy and they state they didn't get script, i reEscribed and they received. Pt informed.

## 2011-04-28 NOTE — Telephone Encounter (Signed)
Follow-up:    Patient is supposed to take a pill two hours prior to her CT Scan and when she went to her [place] [phone] and they did not have it available.  Please call back.

## 2011-04-28 NOTE — Telephone Encounter (Signed)
LOv,Stress,12 lead faxed to [facility] @ [phone]  04/28/11/km

## 2011-04-29 ENCOUNTER — Ambulatory Visit (HOSPITAL_COMMUNITY)
Admission: RE | Admit: 2011-04-29 | Discharge: 2011-04-29 | Disposition: A | Discharge status: Home / Self Care | Payer: BC Managed Care – PPO | Source: Ambulatory Visit | Attending: Cardiology | Admitting: Cardiology

## 2011-04-29 ENCOUNTER — Inpatient Hospital Stay (HOSPITAL_COMMUNITY): Admission: RE | Admit: 2011-04-29 | Payer: BC Managed Care – PPO | Source: Ambulatory Visit

## 2011-04-29 DIAGNOSIS — R0609 Other forms of dyspnea: Secondary | ICD-10-CM

## 2011-04-29 DIAGNOSIS — R0989 Other specified symptoms and signs involving the circulatory and respiratory systems: Secondary | ICD-10-CM | POA: Insufficient documentation

## 2011-04-29 DIAGNOSIS — R0602 Shortness of breath: Secondary | ICD-10-CM

## 2011-04-29 DIAGNOSIS — R0600 Dyspnea, unspecified: Secondary | ICD-10-CM

## 2011-04-29 MED ORDER — IOHEXOL 350 MG/ML SOLN
80.0000 mL | Freq: Once | INTRAVENOUS | Status: AC | PRN
  Administered 2011-04-29: 80 mL via INTRAVENOUS

## 2011-04-29 MED ORDER — METOPROLOL TARTRATE 1 MG/ML IV SOLN
INTRAVENOUS | Status: AC
  Filled 2011-04-29: qty 5

## 2011-04-29 MED ORDER — NITROGLYCERIN 0.4 MG SL SUBL
0.4000 mg | SUBLINGUAL_TABLET | Freq: Once | SUBLINGUAL | Status: AC
  Administered 2011-04-29: 0.4 mg via SUBLINGUAL
  Filled 2011-04-29: qty 25

## 2011-04-29 MED ORDER — NITROGLYCERIN 0.4 MG SL SUBL
SUBLINGUAL_TABLET | SUBLINGUAL | Status: AC
  Filled 2011-04-29: qty 25

## 2011-05-12 ENCOUNTER — Telehealth: Payer: Self-pay | Admitting: *Deleted

## 2011-05-12 DIAGNOSIS — E538 Deficiency of other specified B group vitamins: Secondary | ICD-10-CM

## 2011-05-12 NOTE — Telephone Encounter (Signed)
Dr Olevia Perches- patient had b12 level drawn on 04/15/11. Level was 449. Do you want her to go ahead and continue injections?

## 2011-05-12 NOTE — Telephone Encounter (Signed)
I have spoken to patient to advise her to discontinue b12 injections x 6 months. We will recheck the b12 level around 10/13/11.

## 2011-05-12 NOTE — Telephone Encounter (Signed)
OK to hold B12 for 6 months, then recheck B12 level.

## 2011-07-02 ENCOUNTER — Other Ambulatory Visit: Payer: Self-pay | Admitting: *Deleted

## 2011-07-02 MED ORDER — MERCAPTOPURINE 50 MG PO TABS: 50.0000 mg | ORAL_TABLET | Freq: Every day | ORAL | Status: DC

## 2011-07-02 NOTE — Telephone Encounter (Signed)
rx sent

## 2011-08-02 ENCOUNTER — Other Ambulatory Visit: Payer: Self-pay | Admitting: Internal Medicine

## 2011-08-02 MED ORDER — MERCAPTOPURINE 50 MG PO TABS: 50.0000 mg | ORAL_TABLET | Freq: Every day | ORAL | Status: DC

## 2011-08-02 NOTE — Telephone Encounter (Signed)
I have advised patient that she needs an office visit for refills. We have not seen her in over 1 year. She has scheduled an appointment with Dr Olevia Perches on 08-31-11. I will send enough 32m to get her thru until her 08-31-11 office visit. Patient verbalizes understanding.

## 2011-08-16 ENCOUNTER — Other Ambulatory Visit: Payer: Self-pay | Admitting: Cardiology

## 2011-08-16 DIAGNOSIS — R0609 Other forms of dyspnea: Secondary | ICD-10-CM

## 2011-08-16 DIAGNOSIS — E7800 Pure hypercholesterolemia, unspecified: Secondary | ICD-10-CM

## 2011-08-16 MED ORDER — ATORVASTATIN CALCIUM 20 MG PO TABS: 20.0000 mg | ORAL_TABLET | Freq: Every day | ORAL | Status: DC

## 2011-08-16 NOTE — Telephone Encounter (Signed)
Pt has 2 pills left she needs this asap

## 2011-08-16 NOTE — Telephone Encounter (Signed)
Refilled medication

## 2011-08-18 ENCOUNTER — Encounter: Payer: Self-pay | Admitting: *Deleted

## 2011-08-31 ENCOUNTER — Ambulatory Visit (INDEPENDENT_AMBULATORY_CARE_PROVIDER_SITE_OTHER): Payer: BC Managed Care – PPO | Admitting: Internal Medicine

## 2011-08-31 ENCOUNTER — Encounter: Payer: Self-pay | Admitting: Internal Medicine

## 2011-08-31 VITALS — BP 128/60 | HR 74 | Ht 63.0 in | Wt 134.0 lb

## 2011-08-31 DIAGNOSIS — K5000 Crohn's disease of small intestine without complications: Secondary | ICD-10-CM

## 2011-08-31 MED ORDER — ESOMEPRAZOLE MAGNESIUM 40 MG PO CPDR: 40.0000 mg | DELAYED_RELEASE_CAPSULE | Freq: Every day | ORAL | Status: DC

## 2011-08-31 MED ORDER — MERCAPTOPURINE 50 MG PO TABS: 50.0000 mg | ORAL_TABLET | Freq: Every day | ORAL | Status: DC

## 2011-08-31 NOTE — Progress Notes (Signed)
[name] 01-13-1961 MRN [id number]   History of Present Illness:  This is a 51 year old white female with Crohn's disease of the terminal ileum since 1991. She has been in remission for several years. She has been on 6 MP 50 mg a day with stable blood counts. There is no history of bowel resection. She has been on B12 supplements. Her last level was 309. She denies diarrhea or abdominal pain. Her gastroesophageal reflux has been under control with Nexium 40 mg a day. Her last colonoscopy in March 2011 shoulder mild narrowing at the ileocecal valve. There were pseudopolyps in the distal ileum. He had an upper abdominal ultrasound in 2009 which was normal.  An upper endoscopy in March 2011 showed reflux esophagitis.   Past Medical History  Diagnosis Date  . Vitamin B deficiency   . Anxiety state, unspecified   . Palpitations   . Depressive disorder, not elsewhere classified   . Other and unspecified hyperlipidemia   . Esophageal reflux   . Calculus of gallbladder without mention of cholecystitis or obstruction   . Fibromyalgia   . Crohn disease   . Myalgia and myositis, unspecified   . Allergic rhinitis due to pollen   . Unspecified essential hypertension    Past Surgical History  Procedure Date  . Tonsillectomy and adenoidectomy   . Tubal ligation     BILATERAL  . Exploratory laparotomy   . Esophagogastroduodenoscopy     reports that she has quit smoking. She has never used smokeless tobacco. She reports that she does not drink alcohol or use illicit drugs. family history includes Coronary artery disease in her brother; Heart attack in her mother; Hypertension in her brother, mother, and sister; Lung cancer in her maternal grandmother; and Stroke in her father.  There is no history of Colon cancer. Allergies  Allergen Reactions  . Infliximab     REACTION: SOB        Review of Systems: Denies nausea vomiting rectal bleeding  The remainder of the 10 point ROS is negative  except as outlined in H&P   Physical Exam: General appearance  Well developed, in no distress. Eyes- non icteric. HEENT nontraumatic, normocephalic. Mouth no lesions, tongue papillated, no cheilosis. Neck supple without adenopathy, thyroid not enlarged, no carotid bruits, no JVD. Lungs Clear to auscultation bilaterally. Cor normal S1, normal S2, regular rhythm, no murmur,  quiet precordium. Abdomen: Mildly protuberant and soft with minimal tenderness in right lower quadrant. Normal active bowel sounds. Liver edge at costal margin. Rectal: Done. Extremities no pedal edema. Skin no lesions. Neurological alert and oriented x 3. Psychological normal mood and affect.  Assessment and Plan:  Problem #1 Crohn's disease of the terminal ileum is in remission. We will plan to have him continue 6-MP 50 mg a day and obtain labs which include a CBC, liver function tests, sedimentation rate. She will need a return visit in 1 year. She will continue on all medications.   08/31/2011 [name]

## 2011-08-31 NOTE — Patient Instructions (Signed)
We have sent the following medications to your pharmacy for you to pick up at your convenience: Nexium 6 MP We have given you orders to have the following labwork completed at [place] office (per your request).  CC: Harrison Mons, PA-C

## 2011-09-03 ENCOUNTER — Telehealth: Payer: Self-pay

## 2011-09-09 ENCOUNTER — Other Ambulatory Visit: Payer: Self-pay | Admitting: Physician Assistant

## 2011-09-09 NOTE — Telephone Encounter (Signed)
Pt needs rx for sertraline filled

## 2011-09-09 NOTE — Telephone Encounter (Signed)
Pt pharmacy supposedly sent something over on the 09/02/11 for sertraline.  Pt is kind of upset that this has not been done yet.  Chart is at nurses station for review MR [id number]

## 2011-09-17 ENCOUNTER — Telehealth: Payer: Self-pay | Admitting: *Deleted

## 2011-09-17 NOTE — Telephone Encounter (Signed)
Left message with patient husband to call and schedule f/u appointment with Dr. Aundra Dubin.

## 2011-09-28 ENCOUNTER — Ambulatory Visit (INDEPENDENT_AMBULATORY_CARE_PROVIDER_SITE_OTHER): Payer: BC Managed Care – PPO | Admitting: Internal Medicine

## 2011-09-28 ENCOUNTER — Encounter: Payer: Self-pay | Admitting: Internal Medicine

## 2011-09-28 VITALS — BP 108/62 | HR 65 | Ht 63.0 in | Wt 135.0 lb

## 2011-09-28 DIAGNOSIS — J309 Allergic rhinitis, unspecified: Secondary | ICD-10-CM

## 2011-09-28 NOTE — Patient Instructions (Signed)
Continue present treatment  Please call as needed

## 2011-09-28 NOTE — Progress Notes (Signed)
    Patient ID: [name], female    DOB: 14-Feb-1961, 51 y.o.   MRN: [id number]  HPI 78 yoF followed for allergic rhinitis. It works very well for her to get her  Allergy shots from nurse at work with no problems. She has done well through the Spring tree pollen season so far. This is not a bad season. 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   09/28/11- 62 yoF followed for allergic rhinitis. It works very well for her to get her  Allergy shots from nurse at work with no problems Still works outdoors. Continues allergy vaccine and believes it helps. Shots are given by her nurse at work. Some increased pollen rhinitis this spring, taking antihistamine. Uses saline rinse occasionally. No asthma.  ROS-see HPI Constitutional:   No-   weight loss, night sweats, fevers, chills, fatigue, lassitude. HEENT:   No-  headaches, difficulty swallowing, tooth/dental problems, sore throat,       No-  sneezing, itching, ear ache, nasal congestion, post nasal drip,  CV:  No-   chest pain, orthopnea, PND, swelling in lower extremities, anasarca, dizziness, palpitations Resp: No-   shortness of breath with exertion or at rest.              No-   productive cough,  No non-productive cough,  No- coughing up of blood.              No-   change in color of mucus.  No- wheezing.   Skin: No-   rash or lesions. GI:  No-   heartburn, indigestion, abdominal pain, nausea, vomiting, GU:  MS:  No-   joint pain or swelling. Neuro-     nothing unusual Psych:  No- change in mood or affect. No depression or anxiety.  No memory loss.  OBJ- Physical Exam General- Alert, Oriented, Affect-appropriate, Distress- none acute Skin- rash-none, lesions- none, excoriation- none Lymphadenopathy- none Head- atraumatic            Eyes- Gross vision intact, PERRLA, conjunctivae and secretions clear            Ears- Hearing, canals-normal        Nose- Clear, no-Septal dev, mucus, polyps, erosion, perforation             Throat- Mallampati II , mucosa clear , drainage- none, tonsils- atrophic Neck- flexible , trachea midline, no stridor , thyroid nl, carotid no bruit Chest - symmetrical excursion , unlabored           Heart/CV- RRR , no murmur , no gallop  , no rub, nl s1 s2                           - JVD- none , edema- none, stasis changes- none, varices- none           Lung- clear to P&A, wheeze- none, cough- none , dullness-none, rub- none           Chest wall-  Abd-  Br/ Gen/ Rectal- Not done, not indicated Extrem- cyanosis- none, clubbing, none, atrophy- none, strength- nl Neuro- grossly intact to observation

## 2011-09-29 ENCOUNTER — Ambulatory Visit (INDEPENDENT_AMBULATORY_CARE_PROVIDER_SITE_OTHER): Payer: BC Managed Care – PPO

## 2011-09-29 ENCOUNTER — Telehealth: Payer: Self-pay | Admitting: Cardiology

## 2011-09-29 DIAGNOSIS — J309 Allergic rhinitis, unspecified: Secondary | ICD-10-CM

## 2011-09-29 NOTE — Telephone Encounter (Signed)
New Problem:     I called the patient attempting to schedule an appointment but she was busy at work and said she would call back later today 09/29/11.

## 2011-10-02 NOTE — Assessment & Plan Note (Signed)
She continues to believe allergy vaccine helps her. No heavy exposure outside the spring but she controls with antihistamines and saline rinse.

## 2011-10-08 ENCOUNTER — Encounter: Payer: Self-pay | Admitting: Cardiology

## 2011-10-08 ENCOUNTER — Ambulatory Visit (INDEPENDENT_AMBULATORY_CARE_PROVIDER_SITE_OTHER): Payer: BC Managed Care – PPO | Admitting: Cardiology

## 2011-10-08 VITALS — BP 113/66 | HR 69 | Ht 63.0 in | Wt 135.1 lb

## 2011-10-08 DIAGNOSIS — I2510 Atherosclerotic heart disease of native coronary artery without angina pectoris: Secondary | ICD-10-CM

## 2011-10-08 NOTE — Patient Instructions (Signed)
Your physician recommends that you return for a FASTING lipid profile /liver profile--you have the order. Please fax the results to [phone].  Your physician wants you to follow-up in: 1 year with Dr Aundra Dubin. (May 2014).You will receive a reminder letter in the mail two months in advance. If you don't receive a letter, please call our office to schedule the follow-up appointment.

## 2011-10-10 DIAGNOSIS — I2510 Atherosclerotic heart disease of native coronary artery without angina pectoris: Secondary | ICD-10-CM | POA: Insufficient documentation

## 2011-10-10 NOTE — Progress Notes (Signed)
PCP: Harrison Mons  51 yo with history of Crohns disease, GERD, and hyperlipidemia returns for cardiology followup. ETT-myoview in 3/11 showed no evidence for ischemia or infarction and normal EF.  Recurrent chest pain last fall led to a coronary CT angiogram which showed no obstructive CAD but calcium score put her in a high risk category (87th percentile).  No recent chest pain.  She continues to have occasional palpitations.  Holter monitor in 2011 showed occasional PVCs which I suspect are the cause of her symptoms. Blood pressure is under good control.  Patient reports occasional dyspnea with heavy exertion but in general has good exercise tolerance.  She is now doing Zumba classes and walking about 1 mile daily at work.  She does have a strong family history of CAD and her brother recently had CABG (he was 40).   Labs (3/11): K 3.7, creatinine 0.7  Labs (10/12): LDL 118, HDL 29, TGs 188  ECG: NSR, normal  Allergies (verified):  1) ! * Remicade   Past Medical History:  1. B12 DEFICIENCY (ICD-266.2)  2. ANXIETY (ICD-300.00)  3. PALPITATIONS (ICD-785.1)  4. DEPRESSION (ICD-311)  5. HYPERLIPIDEMIA (ICD-272.4)  6. GERD (ICD-530.81)  7. CHOLELITHIASIS W/O CHOLECYSTITIS W/O OBST (ICD-574.20)  8. CROHN'S DISEASE, HX OF (ICD-V12.70): Followed by Dr. Olevia Perches  9. FIBROMYALGIA (ICD-729.1)  10. ALLERGIC RHINITIS (ICD-477.9)  11. CAD: ETT-myoview (3/11) with EF 67%, hypertensive BP response (234/58), normal perfusion images.  Coronary CT angiogram in 11/12 showed mild nonobstructive plaque in the LAD, calcium score placed her in the 87th percentile.  12. Chronic low back pain  13. Palpitations: Holter (3/11) showed occasional PVCs.  14. HTN  15. Type II diabetes: Diet-controlled.   Family History:   Mother died with MI at age 63. Father with CVA and a heart valve replacement.  Brother with CABG at 11.  Nephew with CVA at 38.  Sister with MI at 18.  Family History High cholesterol  Family  History Hypertension  No FH of Colon Cancer  Social History:  Former Smoker- age 8 to 27  Alcohol use-no  Illicit Drug Use - no  Patient does not get regular exercise.  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   Current Outpatient Prescriptions  Medication Sig Dispense Refill  . aspirin 81 MG chewable tablet Chew 81 mg by mouth daily.        Marland Kitchen atorvastatin (LIPITOR) 20 MG tablet Take 1 tablet (20 mg total) by mouth daily.  30 tablet  6  . cyanocobalamin (,VITAMIN B-12,) 1000 MCG/ML injection Inject 1 mL (1,000 mcg total) into the muscle every 30 (thirty) days.  6 mL  0  . Docosahexaenoic Acid (DHA ALGAL-900 PO) Take by mouth as needed.        Marland Kitchen esomeprazole (NEXIUM) 40 MG capsule Take 1 capsule (40 mg total) by mouth daily.  30 capsule  10  . ibuprofen (ADVIL,MOTRIN) 600 MG tablet Take 600 mg by mouth every 8 (eight) hours as needed.        . mercaptopurine (PURINETHOL) 50 MG tablet Take 1 tablet (50 mg total) by mouth daily.  30 tablet  10  . sertraline (ZOLOFT) 50 MG tablet TAKE 1 AND 1/2 TABLETS BY MOUTH DAILY  45 tablet  5  . zolpidem (AMBIEN) 10 MG tablet Take 10 mg by mouth at bedtime as needed.  BP 113/66  Pulse 69  Ht 5' 3"  (1.6 m)  Wt 135 lb 1.9 oz (61.29 kg)  BMI 23.94 kg/m2 General: NAD Neck: No JVD, no thyromegaly or thyroid nodule.  Lungs: Clear to auscultation bilaterally with normal respiratory effort. CV: Nondisplaced PMI.  Heart regular S1/S2, no S3/S4, no murmur.  No peripheral edema.  No carotid bruit.  Normal pedal pulses.  Abdomen: Soft, nontender, no hepatosplenomegaly, no distention.   Neurologic: Alert and oriented x 3.  Psych: Normal affect. Extremities: No clubbing or cyanosis.

## 2011-10-14 ENCOUNTER — Encounter: Payer: Self-pay | Admitting: Cardiology

## 2011-11-10 ENCOUNTER — Other Ambulatory Visit: Payer: Self-pay | Admitting: *Deleted

## 2011-11-10 ENCOUNTER — Telehealth: Payer: Self-pay | Admitting: *Deleted

## 2011-11-10 MED ORDER — ATORVASTATIN CALCIUM 40 MG PO TABS: 40.0000 mg | ORAL_TABLET | Freq: Every day | ORAL | Status: DC

## 2011-11-10 NOTE — Telephone Encounter (Signed)
Dr Aundra Dubin reviewed lipid panel done 10/14/11. T Chol 154 Trig 86 HDL 38 LDL 99. Dr Aundra Dubin recommended pt increase atorvastatin to 17m daily. Lipid/liver profile in 2 months. I spoke with pt and she is aware of Dr MClaris Gladdenrecommendations. I will mail pt an order for fasting lipid/liver profile in 2 months.

## 2011-11-17 ENCOUNTER — Ambulatory Visit (INDEPENDENT_AMBULATORY_CARE_PROVIDER_SITE_OTHER): Payer: BC Managed Care – PPO | Admitting: Emergency Medicine

## 2011-11-17 ENCOUNTER — Ambulatory Visit: Payer: BC Managed Care – PPO

## 2011-11-17 VITALS — BP 106/66 | HR 58 | Temp 98.4°F | Resp 16 | Ht 63.0 in | Wt 137.0 lb

## 2011-11-17 DIAGNOSIS — M549 Dorsalgia, unspecified: Secondary | ICD-10-CM

## 2011-11-17 MED ORDER — CYCLOBENZAPRINE HCL 10 MG PO TABS: ORAL_TABLET | ORAL | Status: DC

## 2011-11-17 MED ORDER — MELOXICAM 7.5 MG PO TABS: ORAL_TABLET | ORAL | Status: DC

## 2011-11-17 NOTE — Progress Notes (Signed)
  Subjective:    Patient ID: [name], female    DOB: 04-Feb-1961, 51 y.o.   MRN: [id number]  Back Pain   patient and with the pain in the right side of her back. She does not have radicular symptoms. She has a history of intermittent low back pain. She usually goes to a Restaurant manager, fast food.    Review of Systems  Musculoskeletal: Positive for back pain.       Objective:   Physical Exam this area of tenderness over the SI joint on the right the deep tendon reflexes are symmetrical. Straight leg raising is negative.  [facility] reading (PRIMARY) by  Dr.Tailor Westfall  LS-spine films do not show any fractures or acute changes        Assessment & Plan:  The patient here with low back pain. X-rays look normal we'll treat with Flexeril and Mobic ang give OOW note

## 2011-12-09 ENCOUNTER — Other Ambulatory Visit: Payer: Self-pay | Admitting: Physician Assistant

## 2012-01-08 ENCOUNTER — Other Ambulatory Visit: Payer: Self-pay | Admitting: Family Medicine

## 2012-01-27 ENCOUNTER — Telehealth: Payer: Self-pay | Admitting: Cardiology

## 2012-01-27 NOTE — Telephone Encounter (Signed)
Spoke with pt and received lab results done 01/24/12. AST 44 /ALT 82. I will leave for Dr Aundra Dubin to review when he returns to the office next week. Pt is aware.

## 2012-01-27 NOTE — Telephone Encounter (Signed)
New problem:  Blood work recently done. Will be faxing results over. Liver function is higher. Please advise.

## 2012-02-03 ENCOUNTER — Encounter: Payer: Self-pay | Admitting: Cardiology

## 2012-02-03 NOTE — Telephone Encounter (Signed)
Spoke with pt. Pt is aware of Dr Claris Gladden recommendation to repeat LFTs to confirm persistent elevation. If still elevated, hold statin. Pt has an appt this morning with NP. She will ask her to repeat LFTs today  and fax a report to Dr Aundra Dubin.

## 2012-02-08 ENCOUNTER — Other Ambulatory Visit: Payer: Self-pay | Admitting: Family Medicine

## 2012-02-08 MED ORDER — GLUCOSE BLOOD VI STRP: ORAL_STRIP | Status: DC

## 2012-03-07 ENCOUNTER — Ambulatory Visit: Payer: BC Managed Care – PPO

## 2012-03-07 ENCOUNTER — Ambulatory Visit (INDEPENDENT_AMBULATORY_CARE_PROVIDER_SITE_OTHER): Payer: BC Managed Care – PPO | Admitting: Emergency Medicine

## 2012-03-07 ENCOUNTER — Encounter: Payer: Self-pay | Admitting: Emergency Medicine

## 2012-03-07 VITALS — BP 122/72 | HR 66 | Temp 98.9°F | Resp 16 | Ht 62.0 in | Wt 128.0 lb

## 2012-03-07 DIAGNOSIS — M79643 Pain in unspecified hand: Secondary | ICD-10-CM

## 2012-03-07 DIAGNOSIS — M79609 Pain in unspecified limb: Secondary | ICD-10-CM

## 2012-03-07 DIAGNOSIS — M549 Dorsalgia, unspecified: Secondary | ICD-10-CM

## 2012-03-07 MED ORDER — MELOXICAM 7.5 MG PO TABS: ORAL_TABLET | ORAL | Status: DC

## 2012-03-07 NOTE — Progress Notes (Signed)
  Subjective:    Patient ID: [name], female    DOB: [birth year], 51 y.o.   MRN: [id number]  HPI patient enters with pain at the base of her left hand pain over the median nerve of her right hand. Patient works at [employer]. She does repetitive use with both of her hands. She denies any true numbness of the fingers.    Review of Systems     Objective:   Physical Exam examination of the right hand reveals tenderness in the palm of the hand. She has a negative Phalen's Tinel's and Finkelstein test. She is negative for selection of the right wrist. Examination of the left hand reveals significant tenderness over the base of the first metacarpal. She has a negative Phalen's Tinel's and Finkelstein exam of the left wrist. [facility] reading (PRIMARY) by  Dr Everlene Farrier the bony structures of both hands are normal         Assessment & Plan:  We'll give her bilateral wrist splints that include the thumb since she is having so much discomfort in her left thumb. I have refilled her meloxicam. If she continues to have trouble she is to call and I will schedule her to see the orthopedist to consider joint injection

## 2012-03-10 ENCOUNTER — Other Ambulatory Visit: Payer: Self-pay | Admitting: Physician Assistant

## 2012-03-14 ENCOUNTER — Ambulatory Visit (INDEPENDENT_AMBULATORY_CARE_PROVIDER_SITE_OTHER): Payer: BC Managed Care – PPO

## 2012-03-14 ENCOUNTER — Other Ambulatory Visit: Payer: Self-pay | Admitting: Cardiology

## 2012-03-14 DIAGNOSIS — J309 Allergic rhinitis, unspecified: Secondary | ICD-10-CM

## 2012-03-14 MED ORDER — ENALAPRIL MALEATE 5 MG PO TABS: 5.0000 mg | ORAL_TABLET | Freq: Every day | ORAL | Status: DC

## 2012-03-14 NOTE — Telephone Encounter (Signed)
New problem   Refill request .

## 2012-03-31 ENCOUNTER — Other Ambulatory Visit: Payer: Self-pay | Admitting: *Deleted

## 2012-03-31 MED ORDER — ATORVASTATIN CALCIUM 40 MG PO TABS: 40.0000 mg | ORAL_TABLET | Freq: Every day | ORAL | Status: DC

## 2012-04-04 ENCOUNTER — Encounter: Payer: Self-pay | Admitting: Internal Medicine

## 2012-04-11 ENCOUNTER — Telehealth: Payer: Self-pay

## 2012-04-11 NOTE — Telephone Encounter (Signed)
Last office notes faxed thru Epic to patient.

## 2012-04-11 NOTE — Telephone Encounter (Signed)
Pt is needing copy of her last ov notes when ready please fax to [phone] if any questions please contact her @ [phone].

## 2012-05-10 ENCOUNTER — Telehealth: Payer: Self-pay

## 2012-05-10 DIAGNOSIS — M79643 Pain in unspecified hand: Secondary | ICD-10-CM

## 2012-05-10 DIAGNOSIS — M549 Dorsalgia, unspecified: Secondary | ICD-10-CM

## 2012-05-10 MED ORDER — MELOXICAM 7.5 MG PO TABS: ORAL_TABLET | ORAL | Status: DC

## 2012-05-10 NOTE — Telephone Encounter (Signed)
I have refilled her Mobic, but Dr. Everlene Farrier mentioned at the last OV that is she continued to have pain, she should consider seeing an orthopedist for injections. Is this something she would like [place] to arrange?

## 2012-05-10 NOTE — Telephone Encounter (Signed)
The patient called to request a new rx for Mobic as she has misplaced her previous rx.  The patient stated that she is experiencing a significant amount of hand pain and really needs an anti-inflammatory medication to help with this. Please call the patient at [phone].

## 2012-05-11 NOTE — Telephone Encounter (Signed)
I spoke to her to advise. She will pro

## 2012-05-11 NOTE — Telephone Encounter (Signed)
She will proceed with the referral to Ortho.

## 2012-05-11 NOTE — Addendum Note (Signed)
Addended byCandice Camp on: 05/11/2012 09:21 AM   Modules accepted: Orders

## 2012-05-11 NOTE — Telephone Encounter (Signed)
Referral made 

## 2012-05-26 ENCOUNTER — Ambulatory Visit (INDEPENDENT_AMBULATORY_CARE_PROVIDER_SITE_OTHER): Payer: BC Managed Care – PPO | Admitting: Emergency Medicine

## 2012-05-26 VITALS — BP 112/66 | HR 69 | Temp 98.9°F | Resp 18 | Ht 63.0 in | Wt 132.2 lb

## 2012-05-26 DIAGNOSIS — J209 Acute bronchitis, unspecified: Secondary | ICD-10-CM

## 2012-05-26 MED ORDER — HYDROCOD POLST-CHLORPHEN POLST 10-8 MG/5ML PO LQCR: 5.0000 mL | Freq: Two times a day (BID) | ORAL | Status: DC | PRN

## 2012-05-26 NOTE — Progress Notes (Signed)
[facility] [address] [place] [address] [phone]  Date:  05/26/2012   Name:  [name]   DOB:  Dec 01, 1960   MRN:  [id number]  PCP:  Ellenore Roscoe,CHELLE, PA-C    Chief Complaint: Sore Throat and Sinusitis   History of Present Illness:  [name] is a 51 y.o. very pleasant female patient who presents with the following:  Ill since Monday a week ago with cough that is productive of scant purulent material but largely dry.  No wheezing or shortness of breath.  No nausea or vomiting.  Has a mucopurulent nasal drainage. Had a fever last week Wednesday and Thursday of 102-103.  Now afebrile.  Took amoxicillin with no improvement.  Yesterday was given a zpak and has started that.  Patient Active Problem List  Diagnosis  . B12 DEFICIENCY  . HYPERLIPIDEMIA  . ANXIETY  . DEPRESSION  . ESSENTIAL HYPERTENSION, BENIGN  . ALLERGIC RHINITIS  . GERD  . CHOLELITHIASIS W/O CHOLECYSTITIS W/O OBST  . SACROILIITIS NEC  . FIBROMYALGIA  . PALPITATIONS  . CHEST PAIN UNSPECIFIED  . CROHN'S DISEASE, HX OF  . At risk for coronary artery disease  . CAD (coronary artery disease)  . Erroneous encounter - disregard    Past Medical History  Diagnosis Date  . Vitamin B deficiency   . Anxiety state, unspecified   . Palpitations   . Depressive disorder, not elsewhere classified   . Other and unspecified hyperlipidemia   . Esophageal reflux   . Calculus of gallbladder without mention of cholecystitis or obstruction   . Fibromyalgia   . Crohn disease   . Myalgia and myositis, unspecified   . Allergic rhinitis due to pollen   . Unspecified essential hypertension     Past Surgical History  Procedure Date  . Tonsillectomy and adenoidectomy   . Tubal ligation     BILATERAL  . Exploratory laparotomy   . Esophagogastroduodenoscopy     History  Substance Use Topics  . Smoking status: Former Research scientist (life sciences)  . Smokeless tobacco: Never Used  . Alcohol Use: No     Family History  Problem Relation Age of Onset  . Lung cancer Maternal Grandmother   . Stroke Father   . Hypertension Mother   . Hypertension Brother   . Coronary artery disease Brother   . Heart attack Mother   . Hypertension Sister   . Colon cancer Neg Hx     Allergies  Allergen Reactions  . Infliximab     REACTION: SOB    Medication list has been reviewed and updated.  Current Outpatient Prescriptions on File Prior to Visit  Medication Sig Dispense Refill  . aspirin 81 MG chewable tablet Chew 81 mg by mouth daily.        Marland Kitchen atorvastatin (LIPITOR) 40 MG tablet Take 1 tablet (40 mg total) by mouth daily.  30 tablet  3  . cyanocobalamin (,VITAMIN B-12,) 1000 MCG/ML injection Inject 1 mL (1,000 mcg total) into the muscle every 30 (thirty) days.  6 mL  0  . cyclobenzaprine (FLEXERIL) 10 MG tablet Take one half tablet twice a day during the day and one tablet at night for back pain  30 tablet  0  . enalapril (VASOTEC) 5 MG tablet Take 1 tablet (5 mg total) by mouth daily.  90 tablet  2  . esomeprazole (NEXIUM) 40 MG capsule Take 1 capsule (40 mg total) by mouth daily.  30 capsule  10  .  glucose blood test strip Test once daily, needs office visit for more  100 each  0  . ibuprofen (ADVIL,MOTRIN) 600 MG tablet Take 600 mg by mouth every 8 (eight) hours as needed.        . meloxicam (MOBIC) 7.5 MG tablet Take 1-2 tablets daily for joint pain  30 tablet  1  . mercaptopurine (PURINETHOL) 50 MG tablet Take 1 tablet (50 mg total) by mouth daily.  30 tablet  10  . sertraline (ZOLOFT) 50 MG tablet TAKE 1 AND 1/2 TABLETS BY MOUTH DAILY  45 tablet  1  . zolpidem (AMBIEN) 10 MG tablet Take 10 mg by mouth at bedtime as needed.        . Docosahexaenoic Acid (DHA ALGAL-900 PO) Take by mouth as needed.          Review of Systems:  As per HPI, otherwise negative.    Physical Examination: Filed Vitals:   05/26/12 0806  BP: 112/66  Pulse: 69  Temp: 98.9 F (37.2 C)  Resp: 18   Filed  Vitals:   05/26/12 0806  Height: 5' 3"  (1.6 m)  Weight: 132 lb 3.2 oz (59.966 kg)   Body mass index is 23.42 kg/(m^2). Ideal Body Weight: Weight in (lb) to have BMI = 25: 140.8   GEN: WDWN, NAD, Non-toxic, A & O x 3 HEENT: Atraumatic, Normocephalic. Neck supple. No masses, No LAD. Ears and Nose: No external deformity. CV: RRR, No M/G/R. No JVD. No thrill. No extra heart sounds. PULM: CTA B, no wheezes, crackles, rhonchi. No retractions. No resp. distress. No accessory muscle use. ABD: S, NT, ND, +BS. No rebound. No HSM. EXTR: No c/c/e NEURO Normal gait.  PSYCH: Normally interactive. Conversant. Not depressed or anxious appearing.  Calm demeanor.    Assessment and Plan: Acute bronchitis Continue zithromax Add tussionex Follow up as needed  Roselee Culver, MD

## 2012-05-31 ENCOUNTER — Other Ambulatory Visit: Payer: Self-pay | Admitting: Radiology

## 2012-05-31 MED ORDER — GLUCOSE BLOOD VI STRP: ORAL_STRIP | Status: AC

## 2012-06-01 ENCOUNTER — Ambulatory Visit (INDEPENDENT_AMBULATORY_CARE_PROVIDER_SITE_OTHER): Payer: BC Managed Care – PPO | Admitting: Emergency Medicine

## 2012-06-01 ENCOUNTER — Ambulatory Visit: Payer: BC Managed Care – PPO

## 2012-06-01 VITALS — BP 116/70 | HR 82 | Temp 98.7°F | Resp 18 | Ht 63.0 in | Wt 136.4 lb

## 2012-06-01 DIAGNOSIS — R05 Cough: Secondary | ICD-10-CM

## 2012-06-01 DIAGNOSIS — R059 Cough, unspecified: Secondary | ICD-10-CM

## 2012-06-01 DIAGNOSIS — J209 Acute bronchitis, unspecified: Secondary | ICD-10-CM

## 2012-06-01 MED ORDER — HYDROCOD POLST-CHLORPHEN POLST 10-8 MG/5ML PO LQCR: 5.0000 mL | Freq: Two times a day (BID) | ORAL | Status: DC | PRN

## 2012-06-01 NOTE — Progress Notes (Signed)
[facility] [address] [place] [address] [phone]  Date:  06/01/2012   Name:  [name]   DOB:  05/15/1961   MRN:  [id number]  PCP:  Blaine Guiffre,CHELLE, PA-C    Chief Complaint: Follow-up   History of Present Illness:  [name] is a 52 y.o. very pleasant female patient who presents with the following:  Seen 12/27 with bronchitis after being treated with zpak by the RN at work.  Still having a cough productive of scant purulent sputum.  No fever or chills.  Says feels as though it is difficult to breath and not wheezing.  No improvement with medication.  Has pain in swollen nodes in anterior neck associated with a severe sore throat.  Patient Active Problem List  Diagnosis  . B12 DEFICIENCY  . HYPERLIPIDEMIA  . ANXIETY  . DEPRESSION  . ESSENTIAL HYPERTENSION, BENIGN  . ALLERGIC RHINITIS  . GERD  . CHOLELITHIASIS W/O CHOLECYSTITIS W/O OBST  . SACROILIITIS NEC  . FIBROMYALGIA  . PALPITATIONS  . CHEST PAIN UNSPECIFIED  . CROHN'S DISEASE, HX OF  . At risk for coronary artery disease  . CAD (coronary artery disease)  . Erroneous encounter - disregard    Past Medical History  Diagnosis Date  . Vitamin B deficiency   . Anxiety state, unspecified   . Palpitations   . Depressive disorder, not elsewhere classified   . Other and unspecified hyperlipidemia   . Esophageal reflux   . Calculus of gallbladder without mention of cholecystitis or obstruction   . Fibromyalgia   . Crohn disease   . Myalgia and myositis, unspecified   . Allergic rhinitis due to pollen   . Unspecified essential hypertension     Past Surgical History  Procedure Date  . Tonsillectomy and adenoidectomy   . Tubal ligation     BILATERAL  . Exploratory laparotomy   . Esophagogastroduodenoscopy     History  Substance Use Topics  . Smoking status: Former Research scientist (life sciences)  . Smokeless tobacco: Never Used  . Alcohol Use: No    Family History  Problem Relation Age of Onset   . Lung cancer Maternal Grandmother   . Stroke Father   . Hypertension Mother   . Hypertension Brother   . Coronary artery disease Brother   . Heart attack Mother   . Hypertension Sister   . Colon cancer Neg Hx     Allergies  Allergen Reactions  . Infliximab     REACTION: SOB    Medication list has been reviewed and updated.  Current Outpatient Prescriptions on File Prior to Visit  Medication Sig Dispense Refill  . aspirin 81 MG chewable tablet Chew 81 mg by mouth daily.        Marland Kitchen atorvastatin (LIPITOR) 40 MG tablet Take 1 tablet (40 mg total) by mouth daily.  30 tablet  3  . chlorpheniramine-HYDROcodone (TUSSIONEX PENNKINETIC ER) 10-8 MG/5ML LQCR Take 5 mLs by mouth every 12 (twelve) hours as needed (cough).  60 mL  0  . cyanocobalamin (,VITAMIN B-12,) 1000 MCG/ML injection Inject 1 mL (1,000 mcg total) into the muscle every 30 (thirty) days.  6 mL  0  . cyclobenzaprine (FLEXERIL) 10 MG tablet Take one half tablet twice a day during the day and one tablet at night for back pain  30 tablet  0  . Docosahexaenoic Acid (DHA ALGAL-900 PO) Take by mouth as needed.        . enalapril (VASOTEC)  5 MG tablet Take 1 tablet (5 mg total) by mouth daily.  90 tablet  2  . esomeprazole (NEXIUM) 40 MG capsule Take 1 capsule (40 mg total) by mouth daily.  30 capsule  10  . glucose blood test strip Test once daily, needs office visit for more  32 each  0  . ibuprofen (ADVIL,MOTRIN) 600 MG tablet Take 600 mg by mouth every 8 (eight) hours as needed.        . meloxicam (MOBIC) 7.5 MG tablet Take 1-2 tablets daily for joint pain  30 tablet  1  . mercaptopurine (PURINETHOL) 50 MG tablet Take 1 tablet (50 mg total) by mouth daily.  30 tablet  10  . sertraline (ZOLOFT) 50 MG tablet TAKE 1 AND 1/2 TABLETS BY MOUTH DAILY  45 tablet  1  . zolpidem (AMBIEN) 10 MG tablet Take 10 mg by mouth at bedtime as needed.        Marland Kitchen amoxicillin (AMOXIL) 875 MG tablet Take 875 mg by mouth 2 (two) times daily.      Marland Kitchen  azithromycin (ZITHROMAX) 500 MG tablet Take 500 mg by mouth daily.        Review of Systems:  As per HPI, otherwise negative.    Physical Examination: Filed Vitals:   06/01/12 1905  BP: 116/70  Pulse: 82  Temp: 98.7 F (37.1 C)  Resp: 18   Filed Vitals:   06/01/12 1905  Height: 5' 3"  (1.6 m)  Weight: 136 lb 6.4 oz (61.871 kg)   Body mass index is 24.16 kg/(m^2). Ideal Body Weight: Weight in (lb) to have BMI = 25: 140.8   GEN: WDWN, NAD, Non-toxic, A & O x 3  No shortness of breath or rash HEENT: Atraumatic, Normocephalic. Neck supple. No masses, No LAD.  Oropharynx negative.  Hoarse Ears and Nose: No external deformity.  TM negative CV: RRR, No M/G/R. No JVD. No thrill. No extra heart sounds. PULM: CTA B, no wheezes, crackles, rhonchi. No retractions. No resp. distress. No accessory muscle use. ABD: S, NT, ND, +BS. No rebound. No HSM. EXTR: No c/c/e NEURO Normal gait.  PSYCH: Normally interactive. Conversant. Not depressed or anxious appearing.  Calm demeanor.    Assessment and Plan: Bronchitis tussionex  Roselee Culver, MD  [facility] reading (PRIMARY) by  Dr. Ouida Sills.  No active disease.

## 2012-06-26 ENCOUNTER — Other Ambulatory Visit: Payer: Self-pay | Admitting: Physician Assistant

## 2012-07-26 ENCOUNTER — Other Ambulatory Visit: Payer: Self-pay | Admitting: Radiology

## 2012-07-26 NOTE — Telephone Encounter (Signed)
Test strips sent for one month only needs appt prior to 3 mo supply.

## 2012-07-28 ENCOUNTER — Other Ambulatory Visit: Payer: Self-pay | Admitting: *Deleted

## 2012-07-28 MED ORDER — MERCAPTOPURINE 50 MG PO TABS: 50.0000 mg | ORAL_TABLET | Freq: Every day | ORAL | Status: DC

## 2012-08-02 ENCOUNTER — Other Ambulatory Visit: Payer: Self-pay | Admitting: Radiology

## 2012-08-11 ENCOUNTER — Other Ambulatory Visit: Payer: Self-pay | Admitting: Radiology

## 2012-08-24 ENCOUNTER — Telehealth: Payer: Self-pay | Admitting: Internal Medicine

## 2012-08-24 DIAGNOSIS — E119 Type 2 diabetes mellitus without complications: Secondary | ICD-10-CM

## 2012-08-24 MED ORDER — MERCAPTOPURINE 50 MG PO TABS: 50.0000 mg | ORAL_TABLET | Freq: Every day | ORAL | Status: DC

## 2012-08-24 NOTE — Telephone Encounter (Signed)
Pt needs to know when she was first diagnosis ed with diabetics.  Cell phone [phone]

## 2012-08-25 NOTE — Telephone Encounter (Signed)
Spoke to patient. She states she was seen 2-3 years ago and it may have been around that time.  Reviewed chart was diagnosed 01/2010. Called her to advise.

## 2012-08-28 ENCOUNTER — Other Ambulatory Visit: Payer: Self-pay | Admitting: Physician Assistant

## 2012-08-28 ENCOUNTER — Other Ambulatory Visit: Payer: Self-pay | Admitting: Obstetrics and Gynecology

## 2012-09-07 ENCOUNTER — Encounter: Payer: Self-pay | Admitting: Cardiology

## 2012-09-07 ENCOUNTER — Ambulatory Visit (INDEPENDENT_AMBULATORY_CARE_PROVIDER_SITE_OTHER): Payer: BC Managed Care – PPO

## 2012-09-07 DIAGNOSIS — J309 Allergic rhinitis, unspecified: Secondary | ICD-10-CM

## 2012-09-12 ENCOUNTER — Encounter: Payer: Self-pay | Admitting: Internal Medicine

## 2012-09-12 ENCOUNTER — Ambulatory Visit (INDEPENDENT_AMBULATORY_CARE_PROVIDER_SITE_OTHER): Payer: BC Managed Care – PPO | Admitting: Internal Medicine

## 2012-09-12 VITALS — BP 120/78 | HR 98 | Ht 63.0 in | Wt 134.0 lb

## 2012-09-12 DIAGNOSIS — K5090 Crohn's disease, unspecified, without complications: Secondary | ICD-10-CM

## 2012-09-12 DIAGNOSIS — K5000 Crohn's disease of small intestine without complications: Secondary | ICD-10-CM

## 2012-09-12 MED ORDER — MERCAPTOPURINE 50 MG PO TABS: 50.0000 mg | ORAL_TABLET | Freq: Every day | ORAL | Status: DC

## 2012-09-12 MED ORDER — ESOMEPRAZOLE MAGNESIUM 40 MG PO CPDR: 40.0000 mg | DELAYED_RELEASE_CAPSULE | Freq: Every day | ORAL | Status: DC

## 2012-09-12 NOTE — Progress Notes (Signed)
[name] 10-15-1960 MRN [id number]  History of Present Illness:  This is a 52 year old white female with Crohn's disease of the terminal ileum since 1991. Her last office visit with [place] was in April 2013. Her last colonoscopy in March 2011 showed mild narrowing at the ileocecal valve. She has had several episodes of crampy abdominal pain which last several hours and subside spontaneously. She keeps working through the episodes and is usually much better the next day. She has continued on 6 MP 50 mg daily. She reports episodic diarrhea but her weight has been stable and there has been no rectal bleeding. She has occasional gastroesophageal reflux for which she takes Nexium 40 mg daily.   Past Medical History  Diagnosis Date  . Vitamin B deficiency   . Anxiety state, unspecified   . Palpitations   . Depressive disorder, not elsewhere classified   . Other and unspecified hyperlipidemia   . Esophageal reflux   . Calculus of gallbladder without mention of cholecystitis or obstruction   . Fibromyalgia   . Crohn disease   . Myalgia and myositis, unspecified   . Allergic rhinitis due to pollen   . Unspecified essential hypertension    Past Surgical History  Procedure Laterality Date  . Tonsillectomy and adenoidectomy    . Tubal ligation      BILATERAL  . Exploratory laparotomy    . Esophagogastroduodenoscopy      reports that she has quit smoking. She has never used smokeless tobacco. She reports that she does not drink alcohol or use illicit drugs. family history includes Coronary artery disease in her brother; Heart attack in her mother; Hypertension in her brother, mother, and sister; Lung cancer in her maternal grandmother; and Stroke in her father.  There is no history of Colon cancer. Allergies  Allergen Reactions  . Infliximab     REACTION: SOB        Review of Systems: Positive for acid reflux. Denies dysphagia. Positive for diarrhea  The remainder of the 10 point ROS is  negative except as outlined in H&P   Physical Exam: General appearance  Well developed, in no distress. Eyes- non icteric. HEENT nontraumatic, normocephalic. Mouth no lesions, tongue papillated, no cheilosis. Neck supple without adenopathy, thyroid not enlarged, no carotid bruits, no JVD. Lungs Clear to auscultation bilaterally. Cor normal S1, normal S2, regular rhythm, no murmur,  quiet precordium. Abdomen: Soft mildly protuberant with normoactive bowel sounds. Mild tenderness in right lower quadrant. The rest of the abdomen is unremarkable. Rectal: Not done Extremities no pedal edema. Skin no lesions. Neurological alert and oriented x 3. Psychological normal mood and affect.  Assessment and Plan:  Problem #45 52 year old white female with Crohn's disease at the ileocecal valve with mild narrowing of the lumen as per colonoscopy 3 years ago. She will be due for a recall colonoscopy in 2 years. She has no significant symptoms of obstruction. She is to continue on 6-MP 50 mg daily and we will be rechecking her blood tests. She is requesting to have her blood work done at her job and this will include CBC, metabolic panel, TSH and sedimentation rate. Her recent B12 level was 404.    09/12/2012 [name]

## 2012-09-12 NOTE — Patient Instructions (Addendum)
Your physician has requested that you have the following lab work (completed at your office per your request): CBC, Sed Rate, CMET, TSH  We have sent the following medications to your pharmacy for you to pick up at your convenience: Nexium Mercaptopurine  CC: Harrison Mons, PA-C, Dr Everlene Farrier

## 2012-09-13 ENCOUNTER — Encounter: Payer: Self-pay | Admitting: Physician Assistant

## 2012-09-13 ENCOUNTER — Ambulatory Visit (INDEPENDENT_AMBULATORY_CARE_PROVIDER_SITE_OTHER): Payer: BC Managed Care – PPO | Admitting: Family Medicine

## 2012-09-13 VITALS — BP 103/67 | HR 76 | Temp 99.4°F | Resp 16 | Ht 63.0 in | Wt 131.0 lb

## 2012-09-13 DIAGNOSIS — K529 Noninfective gastroenteritis and colitis, unspecified: Secondary | ICD-10-CM

## 2012-09-13 DIAGNOSIS — K5289 Other specified noninfective gastroenteritis and colitis: Secondary | ICD-10-CM

## 2012-09-13 LAB — HEPATIC FUNCTION PANEL
ALT: 24 U/L (ref 7–35)
AST: 17 U/L (ref 13–35)
Bilirubin, Total: 0.6 mg/dL

## 2012-09-13 LAB — COMPREHENSIVE METABOLIC PANEL
ALT: 18 U/L (ref 0–35)
AST: 17 U/L (ref 0–37)
Albumin: 4.3 g/dL (ref 3.5–5.2)
Alkaline Phosphatase: 79 U/L (ref 39–117)
BUN: 17 mg/dL (ref 6–23)
Chloride: 102 mEq/L (ref 96–112)
Potassium: 4 mEq/L (ref 3.5–5.3)
Sodium: 137 mEq/L (ref 135–145)
Total Protein: 6.3 g/dL (ref 6.0–8.3)

## 2012-09-13 LAB — BASIC METABOLIC PANEL
BUN: 12 mg/dL (ref 4–21)
Glucose: 117 mg/dL
Potassium: 4.4 mmol/L (ref 3.4–5.3)
Sodium: 143 mmol/L (ref 137–147)

## 2012-09-13 LAB — POCT CBC
Granulocyte percent: 84.3 %G — AB (ref 37–80)
MID (cbc): 0.2 (ref 0–0.9)
MPV: 8 fL (ref 0–99.8)
POC Granulocyte: 6.6 (ref 2–6.9)
POC LYMPH PERCENT: 12.5 %L (ref 10–50)
Platelet Count, POC: 268 10*3/uL (ref 142–424)
RBC: 4.51 M/uL (ref 4.04–5.48)
RDW, POC: 14.5 %

## 2012-09-13 LAB — HEMOGLOBIN A1C: Hgb A1c MFr Bld: 6.5 % — AB (ref 4.0–6.0)

## 2012-09-13 MED ORDER — PROMETHAZINE HCL 25 MG PO TABS: 25.0000 mg | ORAL_TABLET | Freq: Four times a day (QID) | ORAL | Status: DC | PRN

## 2012-09-13 MED ORDER — ONDANSETRON 4 MG PO TBDP: 8.0000 mg | ORAL_TABLET | Freq: Once | ORAL | Status: DC

## 2012-09-13 NOTE — Progress Notes (Signed)
Subjective:    Patient ID: [name], female    DOB: 03/01/1961, 52 y.o.   MRN: [id number] Chief Complaint  Patient presents with  . Emesis  . Diarrhea  . Generalized Body Aches    HPI  Saw her GI doctor yesterday and was fine but then last night she started feeling miserable.  Ate spaghetti dinner at church - every one else was fine - started having stomach cramping and bloating.  Then she began to have nausea and vomiting of all of her food.  Then diarrhea began immed after with brown and liquid of frequent small amounts.  Got so dehydrated she felt like she was going to pass out and her hands cramped up to where she couldn't move her finger.  Hurts all over with muscle aches and head killing her.  Has been sipping on water all night and has been staying down.  Last vomiting was just about an hour after it started and diarrhea stopped after midnight.  No longer lightheaded or dizzy but weak and bad headache and back pain and just hurts all over.  Did get flu shot this yr.  Does have spring seasonal allergy sxs.  Past Medical History  Diagnosis Date  . Vitamin B deficiency   . Anxiety state, unspecified   . Palpitations   . Depressive disorder, not elsewhere classified   . Other and unspecified hyperlipidemia   . Esophageal reflux   . Calculus of gallbladder without mention of cholecystitis or obstruction   . Fibromyalgia   . Crohn disease   . Myalgia and myositis, unspecified   . Allergic rhinitis due to pollen   . Unspecified essential hypertension    Current Outpatient Prescriptions on File Prior to Visit  Medication Sig Dispense Refill  . aspirin 81 MG chewable tablet Chew 81 mg by mouth daily.        Marland Kitchen atorvastatin (LIPITOR) 40 MG tablet Take 1 tablet (40 mg total) by mouth daily.  30 tablet  3  . cyanocobalamin (,VITAMIN B-12,) 1000 MCG/ML injection Inject 1 mL (1,000 mcg total) into the muscle every 30 (thirty) days.  6 mL  0  . cyclobenzaprine (FLEXERIL) 10 MG tablet  Take one half tablet twice a day during the day and one tablet at night for back pain  30 tablet  0  . enalapril (VASOTEC) 5 MG tablet Take 1 tablet (5 mg total) by mouth daily.  90 tablet  2  . esomeprazole (NEXIUM) 40 MG capsule Take 1 capsule (40 mg total) by mouth daily.  30 capsule  3  . glucose blood test strip Test once daily, needs office visit for more  32 each  0  . ibuprofen (ADVIL,MOTRIN) 600 MG tablet Take 600 mg by mouth every 8 (eight) hours as needed.        . meloxicam (MOBIC) 7.5 MG tablet Take 1-2 tablets daily for joint pain  30 tablet  1  . mercaptopurine (PURINETHOL) 50 MG tablet Take 1 tablet (50 mg total) by mouth daily.  30 tablet  3  . sertraline (ZOLOFT) 50 MG tablet TAKE 1 AND 1/2 TABLETS BY MOUTH DAILY  45 tablet  0  . zolpidem (AMBIEN) 10 MG tablet Take 10 mg by mouth at bedtime as needed.         No current facility-administered medications on file prior to visit.   Allergies  Allergen Reactions  . Infliximab     REACTION: SOB     Review of  Systems  Constitutional: Positive for fever, chills, diaphoresis, activity change, appetite change and fatigue. Negative for unexpected weight change.  HENT: Positive for congestion, sore throat, rhinorrhea, neck pain, neck stiffness and postnasal drip.   Eyes: Positive for itching. Negative for discharge and redness.  Respiratory: Negative for shortness of breath.   Cardiovascular: Negative for chest pain and leg swelling.  Gastrointestinal: Positive for nausea, vomiting, abdominal pain, diarrhea and abdominal distention. Negative for constipation, blood in stool, anal bleeding and rectal pain.  Genitourinary: Positive for decreased urine volume and pelvic pain. Negative for dysuria and difficulty urinating.  Musculoskeletal: Positive for myalgias, back pain and arthralgias. Negative for gait problem.  Skin: Negative for rash.  Neurological: Positive for weakness and headaches. Negative for dizziness, syncope and  light-headedness.  Hematological: Negative for adenopathy.  Psychiatric/Behavioral: Positive for sleep disturbance.      BP 82/40  Pulse 86  Temp(Src) 99.4 F (37.4 C) (Oral)  Resp 16  Ht 5' 3"  (1.6 m)  Wt 131 lb (59.421 kg)  BMI 23.21 kg/m2  SpO2 97% Objective:   Physical Exam  Constitutional: She is oriented to person, place, and time. She appears well-developed and well-nourished. No distress.  Laying on bed in fetal position  HENT:  Head: Normocephalic and atraumatic.  Neck: Normal range of motion. Neck supple. No thyromegaly present.  Cardiovascular: Normal rate, regular rhythm, normal heart sounds and intact distal pulses.   Pulmonary/Chest: Effort normal and breath sounds normal. No respiratory distress.  Abdominal: Soft. Normal appearance. She exhibits no mass. Bowel sounds are increased. There is no hepatosplenomegaly. There is tenderness in the suprapubic area. There is no rigidity, no rebound, no guarding and no CVA tenderness.  Musculoskeletal: She exhibits no edema.  Lymphadenopathy:    She has no cervical adenopathy.  Neurological: She is alert and oriented to person, place, and time.  Skin: Skin is warm and dry. She is not diaphoretic. No erythema.  Psychiatric: She has a normal mood and affect. Her behavior is normal.      orthostatic VS negative - listed in extended vitals  Results for orders placed in visit on 09/13/12  COMPREHENSIVE METABOLIC PANEL      Result Value Range   Sodium 137  135 - 145 mEq/L   Potassium 4.0  3.5 - 5.3 mEq/L   Chloride 102  96 - 112 mEq/L   CO2 21  19 - 32 mEq/L   Glucose, Bld 118 (*) 70 - 99 mg/dL   BUN 17  6 - 23 mg/dL   Creat 0.80  0.50 - 1.10 mg/dL   Total Bilirubin 0.7  0.3 - 1.2 mg/dL   Alkaline Phosphatase 79  39 - 117 U/L   AST 17  0 - 37 U/L   ALT 18  0 - 35 U/L   Total Protein 6.3  6.0 - 8.3 g/dL   Albumin 4.3  3.5 - 5.2 g/dL   Calcium 9.0  8.4 - 10.5 mg/dL  LIPASE      Result Value Range   Lipase 20  0 - 75  U/L  POCT CBC      Result Value Range   WBC 7.8  4.6 - 10.2 K/uL   Lymph, poc 1.0  0.6 - 3.4   POC LYMPH PERCENT 12.5  10 - 50 %L   MID (cbc) 0.2  0 - 0.9   POC MID % 3.2  0 - 12 %M   POC Granulocyte 6.6  2 - 6.9  Granulocyte percent 84.3 (*) 37 - 80 %G   RBC 4.51  4.04 - 5.48 M/uL   Hemoglobin 13.6  12.2 - 16.2 g/dL   HCT, POC 43.1  37.7 - 47.9 %   MCV 95.6  80 - 97 fL   MCH, POC 30.2  27 - 31.2 pg   MCHC 31.6 (*) 31.8 - 35.4 g/dL   RDW, POC 14.5     Platelet Count, POC 268  142 - 424 K/uL   MPV 8.0  0 - 99.8 fL    Assessment & Plan:  Acute gastroenteritis with dehydration - zofran 17m sl x 1 now. IV places for IVF - 2L given, stat labs ordered - cbc, cmp, lipase.  Cont prn phenergan and push fluids today. Hopefully will turn around quickly since no further vomiting or diarrhea for > 10 hrs now.  Pt feeling much better, decreased HA, myalgias, weakness by time of discharge.

## 2012-09-19 ENCOUNTER — Encounter: Payer: Self-pay | Admitting: Physician Assistant

## 2012-09-19 LAB — MICROALBUMIN, URINE: Microalb, Ur: 0.6

## 2012-09-28 ENCOUNTER — Ambulatory Visit: Payer: BC Managed Care – PPO | Admitting: Pulmonary Disease

## 2012-09-29 ENCOUNTER — Other Ambulatory Visit: Payer: Self-pay | Admitting: Physician Assistant

## 2012-10-02 ENCOUNTER — Ambulatory Visit (INDEPENDENT_AMBULATORY_CARE_PROVIDER_SITE_OTHER): Payer: BC Managed Care – PPO | Admitting: Internal Medicine

## 2012-10-02 ENCOUNTER — Encounter: Payer: Self-pay | Admitting: Internal Medicine

## 2012-10-02 VITALS — BP 118/76 | HR 66 | Ht 62.5 in | Wt 132.2 lb

## 2012-10-02 DIAGNOSIS — J309 Allergic rhinitis, unspecified: Secondary | ICD-10-CM

## 2012-10-02 MED ORDER — METHYLPREDNISOLONE ACETATE 80 MG/ML IJ SUSP
80.0000 mg | Freq: Once | INTRAMUSCULAR | Status: AC
  Administered 2012-10-02: 80 mg via INTRAMUSCULAR

## 2012-10-02 NOTE — Progress Notes (Signed)
Patient ID: [name], female    DOB: [birth year], 52 y.o.   MRN: [id number]  HPI 65 yoF followed for allergic rhinitis. It works very well for her to get her  Allergy shots from nurse at work with no problems. She has done well through the Spring tree pollen season so far. This is not a bad season. 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   09/28/11- 21 yoF followed for allergic rhinitis. It works very well for her to get her  Allergy shots from nurse at work with no problems Still works outdoors. Continues allergy vaccine and believes it helps. Shots are given by her nurse at work. Some increased pollen rhinitis this spring, taking antihistamine. Uses saline rinse occasionally. No asthma.  10/01/12-52 yoF followed for allergic rhinitis. It works very well for her to get her  Allergy shots from nurse at work  [place]: still on allergy vaccine 1:10 GO and using OTC allergy meds to help with itchy watery eyes; as well as runny nose/itchy x 2-3 weeks. Chest ok, no cough or wheeze.  ROS-see HPI Constitutional:   No-   weight loss, night sweats, fevers, chills, fatigue, lassitude. HEENT:   No-  headaches, difficulty swallowing, tooth/dental problems, sore throat,       No-  sneezing, itching, ear ache, nasal congestion, post nasal drip,  CV:  No-   chest pain, orthopnea, PND, swelling in lower extremities, anasarca, dizziness, palpitations Resp: No-   shortness of breath with exertion or at rest.              No-   productive cough,  No non-productive cough,  No- coughing up of blood.              No-   change in color of mucus.  No- wheezing.   Skin: No-   rash or lesions. GI:  No-   heartburn, indigestion, abdominal pain, nausea, vomiting, GU:  MS:  No-   joint pain or swelling. Neuro-     nothing unusual Psych:  No- change in mood or affect. No depression or anxiety.  No memory loss.  OBJ-  Physical Exam General- Alert, Oriented, Affect-appropriate, Distress- none acute Skin- rash-none, lesions- none, excoriation- none Lymphadenopathy- none Head- atraumatic            Eyes- Gross vision intact, PERRLA, conjunctivae and secretions clear            Ears- Hearing, canals-normal            Nose- + active sniffling, no-Septal dev, mucus, polyps, erosion, perforation             Throat- Mallampati II , mucosa clear , drainage- none, tonsils- atrophic Neck- flexible , trachea midline, no stridor , thyroid nl, carotid no bruit Chest - symmetrical excursion , unlabored           Heart/CV- RRR , no murmur , no gallop  , no rub, nl s1 s2                           - JVD- none , edema- none, stasis changes- none, varices- none           Lung- clear to P&A, wheeze- none, cough- none , dullness-none, rub- none  Chest wall-  Abd-  Br/ Gen/ Rectal- Not done, not indicated Extrem- cyanosis- none, clubbing, none, atrophy- none, strength- nl Neuro- grossly intact to observation

## 2012-10-02 NOTE — Patient Instructions (Addendum)
Depo 80  Sample Dymista nasal spray    1-2 puffs each nostril once daily at bedtime.  Ok for now to continue your antihistamine pill if needed  Schedule allergy skin testing. Antihistamines directly block the skin tests, so please- for 3 days before skin testing- no antihistamines, including Dymista, otc sleep meds, cough and cold meds or cough syrups.

## 2012-10-09 ENCOUNTER — Ambulatory Visit: Payer: BC Managed Care – PPO | Admitting: Cardiology

## 2012-10-11 NOTE — Assessment & Plan Note (Signed)
Control not as good this Spring. We decided to update skin testing. Sample Dymista.

## 2012-11-02 ENCOUNTER — Ambulatory Visit (INDEPENDENT_AMBULATORY_CARE_PROVIDER_SITE_OTHER): Payer: BC Managed Care – PPO | Admitting: Cardiology

## 2012-11-02 ENCOUNTER — Encounter: Payer: Self-pay | Admitting: Cardiology

## 2012-11-02 VITALS — BP 104/72 | HR 69 | Ht 63.0 in | Wt 132.6 lb

## 2012-11-02 DIAGNOSIS — R002 Palpitations: Secondary | ICD-10-CM

## 2012-11-02 DIAGNOSIS — E785 Hyperlipidemia, unspecified: Secondary | ICD-10-CM

## 2012-11-02 DIAGNOSIS — I2510 Atherosclerotic heart disease of native coronary artery without angina pectoris: Secondary | ICD-10-CM

## 2012-11-02 DIAGNOSIS — I10 Essential (primary) hypertension: Secondary | ICD-10-CM

## 2012-11-02 NOTE — Patient Instructions (Signed)
Your physician recommends that you have  a FASTING lipid profile /liver profile in October 2014. You have the order. Please fax the results to [phone] Dr Aundra Dubin.  Your physician wants you to follow-up in: 1 year with Dr Aundra Dubin. (June 2015).You will receive a reminder letter in the mail two months in advance. If you don't receive a letter, please call our office to schedule the follow-up appointment.

## 2012-11-03 NOTE — Progress Notes (Signed)
Patient ID: [name], female   DOB: November 29, 1960, 52 y.o.   MRN: [id number] PCP: Harrison Mons  52 yo with history of Crohns disease, GERD, and hyperlipidemia returns for cardiology followup. ETT-myoview in 3/11 showed no evidence for ischemia or infarction and normal EF.  Recurrent chest pain led to a coronary CT angiogram which showed no obstructive CAD but calcium score put her in a high risk category (87th percentile).  No recent chest pain.  She continues to have occasional palpitations.  Holter monitor in 2011 showed occasional PVCs which I suspect are the cause of her symptoms. Blood pressure is under good control.  Patient reports occasional dyspnea with heavy exertion but in general has good exercise tolerance.  She walks for about a mile daily for exercise.   She does have a strong family history of CAD and her brother had CABG (he was 65).   Labs (3/11): K 3.7, creatinine 0.7  Labs (10/12): LDL 118, HDL 29, TGs 188 Labs (4/14): LDL 93, HDL 37, creatinine 0.7  ECG: NSR, borderline inferior Qs, right axis deviation.   Allergies (verified):  1) ! * Remicade   Past Medical History:  1. B12 DEFICIENCY (ICD-266.2)  2. ANXIETY (ICD-300.00)  3. PALPITATIONS (ICD-785.1)  4. DEPRESSION (ICD-311)  5. HYPERLIPIDEMIA (ICD-272.4)  6. GERD (ICD-530.81)  7. CHOLELITHIASIS W/O CHOLECYSTITIS W/O OBST (ICD-574.20)  8. CROHN'S DISEASE, HX OF (ICD-V12.70): Followed by Dr. Olevia Perches  9. FIBROMYALGIA (ICD-729.1)  10. ALLERGIC RHINITIS (ICD-477.9)  11. CAD: ETT-myoview (3/11) with EF 67%, hypertensive BP response (234/58), normal perfusion images.  Coronary CT angiogram in 11/12 showed mild nonobstructive plaque in the LAD, calcium score placed her in the 87th percentile.  12. Chronic low back pain  13. Palpitations: Holter (3/11) showed occasional PVCs.  14. HTN  15. Type II diabetes: Diet-controlled.   Family History:   Mother died with MI at age 3. Father with CVA and a heart valve replacement.   Brother with CABG at 71.  Nephew with CVA at 81.  Sister with MI at 71.  Family History High cholesterol  Family History Hypertension  No FH of Colon Cancer  Social History:  Former Smoker- age 64 to 32  Alcohol use-no  Illicit Drug Use - no  Patient does not get regular exercise.  Daily Caffeine Use: 2 cups daily  Account rep for [employer]    Current Outpatient Prescriptions  Medication Sig Dispense Refill  . aspirin 81 MG chewable tablet Chew 81 mg by mouth daily.        Marland Kitchen atorvastatin (LIPITOR) 40 MG tablet Take 1 tablet (40 mg total) by mouth daily.  30 tablet  3  . cyanocobalamin (,VITAMIN B-12,) 1000 MCG/ML injection Inject 1 mL (1,000 mcg total) into the muscle every 30 (thirty) days.  6 mL  0  . cyclobenzaprine (FLEXERIL) 10 MG tablet Take one half tablet twice a day during the day and one tablet at night for back pain  30 tablet  0  . enalapril (VASOTEC) 5 MG tablet Take 1 tablet (5 mg total) by mouth daily.  90 tablet  2  . esomeprazole (NEXIUM) 40 MG capsule Take 1 capsule (40 mg total) by mouth daily.  30 capsule  3  . glucose blood test strip Test once daily, needs office visit for more  32 each  0  . ibuprofen (ADVIL,MOTRIN) 600 MG tablet Take 600 mg by mouth every 8 (eight) hours as needed.        Marland Kitchen  meloxicam (MOBIC) 7.5 MG tablet Take 1-2 tablets daily for joint pain  30 tablet  1  . mercaptopurine (PURINETHOL) 50 MG tablet Take 1 tablet (50 mg total) by mouth daily.  30 tablet  3  . sertraline (ZOLOFT) 50 MG tablet TAKE 1 AND 1/2 TABLETS BY MOUTH DAILY  45 tablet  1  . zolpidem (AMBIEN) 10 MG tablet Take 10 mg by mouth at bedtime as needed.         No current facility-administered medications for this visit.    BP 104/72  Pulse 69  Ht 5' 3"  (1.6 m)  Wt 132 lb 9.6 oz (60.147 kg)  BMI 23.49 kg/m2  SpO2 97% General: NAD Neck: No JVD, no thyromegaly or thyroid nodule.  Lungs: Clear to auscultation bilaterally with normal respiratory effort. CV:  Nondisplaced PMI.  Heart regular S1/S2, no S3/S4, no murmur.  No peripheral edema.  No carotid bruit.  Normal pedal pulses.  Abdomen: Soft, nontender, no hepatosplenomegaly, no distention.   Neurologic: Alert and oriented x 3.  Psych: Normal affect. Extremities: No clubbing or cyanosis.   Assessment/Plan: 1. CAD: Nonobstructive.  Strong family history of CAD.  Continue ASA 81, ACEI, and statin.  2. Hyperlipidemia: LDL 93 in 4/14, would like to see < 70.  She is on atorvastatin 40.  Rather than increasing atorvastatin, she is going to work on her diet for 6 months and we will repeat lipids (10/14).  3. HTN: BP controlled on enalapril.   Loralie Champagne 11/03/2012

## 2012-11-13 ENCOUNTER — Other Ambulatory Visit: Payer: Self-pay | Admitting: *Deleted

## 2012-11-13 MED ORDER — ATORVASTATIN CALCIUM 40 MG PO TABS: 40.0000 mg | ORAL_TABLET | Freq: Every day | ORAL | Status: DC

## 2012-11-15 ENCOUNTER — Telehealth: Payer: Self-pay | Admitting: Cardiology

## 2012-11-15 MED ORDER — ROSUVASTATIN CALCIUM 5 MG PO TABS: 5.0000 mg | ORAL_TABLET | Freq: Every day | ORAL | Status: DC

## 2012-11-15 NOTE — Telephone Encounter (Signed)
New problem    C/O sided effect from Lipitor.  Cramps, charlie horses in leg, weak. Tried,.

## 2012-11-15 NOTE — Telephone Encounter (Signed)
Spoke with patient. Pt states she is having leg cramps,weakness in her arms and legs, headaches and tiredness. She feels this due to lipitor. I advised pt to stop lipitor and I am forwarding to Dr Aundra Dubin for review.

## 2012-11-15 NOTE — Telephone Encounter (Signed)
I thought she had been on Lipitor for a long time.  I just saw her and she did not mention this.  Would have her stay off statin x 1 week then start Crestor 5 mg daily with lipids/LFTs in 2 months.

## 2012-11-15 NOTE — Telephone Encounter (Signed)
Pt advised. Pt will have L/L the end of August at her employer.

## 2012-11-23 ENCOUNTER — Ambulatory Visit: Payer: BC Managed Care – PPO

## 2012-11-23 ENCOUNTER — Ambulatory Visit (INDEPENDENT_AMBULATORY_CARE_PROVIDER_SITE_OTHER): Payer: BC Managed Care – PPO | Admitting: Family Medicine

## 2012-11-23 VITALS — BP 136/76 | HR 64 | Temp 98.3°F | Resp 16 | Ht 62.5 in | Wt 134.2 lb

## 2012-11-23 DIAGNOSIS — R05 Cough: Secondary | ICD-10-CM

## 2012-11-23 DIAGNOSIS — R062 Wheezing: Secondary | ICD-10-CM

## 2012-11-23 DIAGNOSIS — R059 Cough, unspecified: Secondary | ICD-10-CM

## 2012-11-23 DIAGNOSIS — J209 Acute bronchitis, unspecified: Secondary | ICD-10-CM

## 2012-11-23 MED ORDER — ALBUTEROL SULFATE (5 MG/ML) 0.5% IN NEBU: 2.5000 mg | INHALATION_SOLUTION | Freq: Four times a day (QID) | RESPIRATORY_TRACT | Status: DC | PRN

## 2012-11-23 MED ORDER — HYDROCODONE-HOMATROPINE 5-1.5 MG/5ML PO SYRP: 5.0000 mL | ORAL_SOLUTION | Freq: Every evening | ORAL | Status: DC | PRN

## 2012-11-23 MED ORDER — ALBUTEROL SULFATE (2.5 MG/3ML) 0.083% IN NEBU
2.5000 mg | INHALATION_SOLUTION | Freq: Once | RESPIRATORY_TRACT | Status: AC
  Administered 2012-11-23: 2.5 mg via RESPIRATORY_TRACT

## 2012-11-23 MED ORDER — METHYLPREDNISOLONE 4 MG PO KIT: PACK | ORAL | Status: DC

## 2012-11-23 MED ORDER — IPRATROPIUM BROMIDE 0.02 % IN SOLN
0.5000 mg | Freq: Once | RESPIRATORY_TRACT | Status: AC
  Administered 2012-11-23: 0.5 mg via RESPIRATORY_TRACT

## 2012-11-23 MED ORDER — FLUCONAZOLE 150 MG PO TABS: 150.0000 mg | ORAL_TABLET | Freq: Once | ORAL | Status: DC

## 2012-11-23 MED ORDER — LEVOFLOXACIN 500 MG PO TABS: 500.0000 mg | ORAL_TABLET | Freq: Every day | ORAL | Status: DC

## 2012-11-23 NOTE — Patient Instructions (Signed)
Acute Bronchitis You have acute bronchitis. This means you have a chest cold. The airways in your lungs are red and sore (inflamed). Acute means it is sudden onset.  CAUSES Bronchitis is most often caused by the same virus that causes a cold. SYMPTOMS   Body aches.  Chest congestion.  Chills.  Cough.  Fever.  Shortness of breath.  Sore throat. TREATMENT  Acute bronchitis is usually treated with rest, fluids, and medicines for relief of fever or cough. Most symptoms should go away after a few days or a week. Increased fluids may help thin your secretions and will prevent dehydration. Your caregiver may give you an inhaler to improve your symptoms. The inhaler reduces shortness of breath and helps control cough. You can take over-the-counter pain relievers or cough medicine to decrease coughing, pain, or fever. A cool-air vaporizer may help thin bronchial secretions and make it easier to clear your chest. Antibiotics are usually not needed but can be prescribed if you smoke, are seriously ill, have chronic lung problems, are elderly, or you are at higher risk for developing complications.Allergies and asthma can make bronchitis worse. Repeated episodes of bronchitis may cause longstanding lung problems. Avoid smoking and secondhand smoke.Exposure to cigarette smoke or irritating chemicals will make bronchitis worse. If you are a cigarette smoker, consider using nicotine gum or skin patches to help control withdrawal symptoms. Quitting smoking will help your lungs heal faster. Recovery from bronchitis is often slow, but you should start feeling better after 2 to 3 days. Cough from bronchitis frequently lasts for 3 to 4 weeks. To prevent another bout of acute bronchitis:  Quit smoking.  Wash your hands frequently to get rid of viruses or use a hand sanitizer.  Avoid other people with cold or virus symptoms.  Try not to touch your hands to your mouth, nose, or eyes. SEEK IMMEDIATE  MEDICAL CARE IF:  You develop increased fever, chills, or chest pain.  You have severe shortness of breath or bloody sputum.  You develop dehydration, fainting, repeated vomiting, or a severe headache.  You have no improvement after 1 week of treatment or you get worse. MAKE SURE YOU:   Understand these instructions.  Will watch your condition.  Will get help right away if you are not doing well or get worse. Document Released: 06/24/2004 Document Revised: 08/09/2011 Document Reviewed: 09/09/2010 [employer] Patient Information 2014 [employer].  

## 2012-11-23 NOTE — Progress Notes (Signed)
 [facility]:  Office Visit  Chief Complaint:  Chief Complaint  Patient presents with  . Cough    x 2 weeks, taking Mucinex, cough med w/codeine, productive w yellow mucus. Rx Doxy 100 BID Monday  . Wheezing    x 2 weeks, chest tightness  . Fever    low grade last weekend  . Fatigue  . Foot Pain    L foot x yesterday, no known injury    HPI: [name] is a 52 y.o. female who complains of :  2 week history of drainage that is now yellow sputum, she has cough , wheezing and rattling and low grade temps. She is a smoker.  Tried mucinex without releif and also cough medicine with hydrocodone.  Left foot pain and swelling without NKI x 1 day It is between  Her 4th-2nd toes on the top of her foot.No new foot wear. No prior surgeries/injuries. No numbness/weakness/tingling    Past Medical History  Diagnosis Date  . Vitamin B deficiency   . Anxiety state, unspecified   . Palpitations   . Depressive disorder, not elsewhere classified   . Other and unspecified hyperlipidemia   . Esophageal reflux   . Calculus of gallbladder without mention of cholecystitis or obstruction   . Fibromyalgia   . Crohn disease   . Myalgia and myositis, unspecified   . Allergic rhinitis due to pollen   . Unspecified essential hypertension   . Diabetes mellitus without complication    Past Surgical History  Procedure Laterality Date  . Tonsillectomy and adenoidectomy    . Tubal ligation      BILATERAL  . Exploratory laparotomy    . Esophagogastroduodenoscopy     History   Social History  . Marital Status: Married    Spouse Name: N/A    Number of Children: N/A  . Years of Education: N/A   Occupational History  . MACHINE OPERATOR [employer]   Social History Main Topics  . Smoking status: Former Smoker -- 1.00 packs/day for 35 years    Types: Cigarettes    Quit date: 05/31/2004  . Smokeless tobacco: Never Used  . Alcohol Use: No  . Drug Use: No   . Sexually Active: None     Comment: married   Other Topics Concern  . None   Social History Narrative  . None   Family History  Problem Relation Age of Onset  . Lung cancer Maternal Grandmother   . Stroke Father   . Hypertension Mother   . Heart attack Mother   . Heart disease Mother   . Hypertension Brother   . Heart disease Brother   . Coronary artery disease Brother   . Hypertension Sister   . Heart disease Sister   . Colon cancer Neg Hx    Allergies  Allergen Reactions  . Infliximab     REACTION: SOB   Prior to Admission medications   Medication Sig Start Date End Date Taking? Authorizing Provider  aspirin 81 MG chewable tablet Chew 81 mg by mouth daily.     Yes Historical Provider, MD  cyanocobalamin (,VITAMIN B-12,) 1000 MCG/ML injection Inject 1 mL (1,000 mcg total) into the muscle every 30 (thirty) days. 11/17/10  Yes Lafayette Dragon, MD  cyclobenzaprine (FLEXERIL) 10 MG tablet Take one half tablet twice a day during the day and one tablet at night for back pain 11/17/11  Yes Darlyne Russian, MD  enalapril (VASOTEC) 5 MG  tablet Take 1 tablet (5 mg total) by mouth daily. 03/14/12  Yes Larey Dresser, MD  esomeprazole (NEXIUM) 40 MG capsule Take 1 capsule (40 mg total) by mouth daily. 09/12/12  Yes Lafayette Dragon, MD  glucose blood test strip Test once daily, needs office visit for more 05/31/12 05/31/13 Yes Eleanore E Elana Alm, PA-C  meloxicam (MOBIC) 7.5 MG tablet Take 1-2 tablets daily for joint pain prn 05/10/12  Yes Heather M Marte, PA-C  mercaptopurine (PURINETHOL) 50 MG tablet Take 1 tablet (50 mg total) by mouth daily. 09/12/12  Yes Lafayette Dragon, MD  rosuvastatin (CRESTOR) 5 MG tablet Take 1 tablet (5 mg total) by mouth daily. 11/15/12  Yes Larey Dresser, MD  ibuprofen (ADVIL,MOTRIN) 600 MG tablet Take 600 mg by mouth every 8 (eight) hours as needed.      Historical Provider, MD  sertraline (ZOLOFT) 50 MG tablet TAKE 1 AND 1/2 TABLETS BY MOUTH DAILY 09/29/12   Rise Mu,  PA-C  zolpidem (AMBIEN) 10 MG tablet Take 10 mg by mouth at bedtime as needed.      Historical Provider, MD     ROS: The patient denies fevers, chills, night sweats, unintentional weight loss, chest pain, palpitations, wheezing, dyspnea on exertion, nausea, vomiting, abdominal pain, dysuria, hematuria, melena, numbness, weakness, or tingling.   All other systems have been reviewed and were otherwise negative with the exception of those mentioned in the HPI and as above.    PHYSICAL EXAM: Filed Vitals:   11/23/12 1634  BP: 136/76  Pulse: 64  Temp: 98.3 F (36.8 C)  Resp: 16   Filed Vitals:   11/23/12 1634  Height: 5' 2.5" (1.588 m)  Weight: 134 lb 3.2 oz (60.873 kg)   Body mass index is 24.14 kg/(m^2).  General: Alert, no acute distress HEENT:  Normocephalic, atraumatic, oropharynx patent.  Cardiovascular:  Regular rate and rhythm, no rubs murmurs or gallops.  No Carotid bruits, radial pulse intact. No pedal edema.  Respiratory: Clear to auscultation bilaterally.  No wheezes, rales; + rhonchi.  No cyanosis, no use of accessory musculature GI: No organomegaly, abdomen is soft and non-tender, positive bowel sounds.  No masses. Skin: No rashes. Neurologic: Facial musculature symmetric. Psychiatric: Patient is appropriate throughout our interaction. Lymphatic: No cervical lymphadenopathy Musculoskeletal: Gait intact. + trace  swelling right foot on top of foot. 5/5 strength, full ROM, + DP Neg squeeze test No morton's neuroma   LABS: Results for orders placed in visit on 31/54/00  BASIC METABOLIC PANEL      Result Value Range   Glucose 117     BUN 12  4 - 21 mg/dL   Creatinine 0.7  0.5 - 1.1 mg/dL   Potassium 4.4  3.4 - 5.3 mmol/L   Sodium 143  137 - 147 mmol/L  LIPID PANEL      Result Value Range   LDl/HDL Ratio 4.3     Triglycerides 145  40 - 160 mg/dL   Cholesterol 159  0 - 200 mg/dL   HDL 37  35 - 70 mg/dL   LDL Cholesterol 93    HEPATIC FUNCTION PANEL       Result Value Range   Alkaline Phosphatase 88  25 - 125 U/L   ALT 24  7 - 35 U/L   AST 17  13 - 35 U/L   Bilirubin, Total 0.6    HEMOGLOBIN A1C      Result Value Range   Hemoglobin A1C 6.5 (*)  4.0 - 6.0 %  MICROALBUMIN, URINE      Result Value Range   Microalb, Ur 0.6     Vitamin B-12 404     Folate 12.1       EKG/XRAY:   Primary read interpreted by Dr. Marin Comment at [facility]. No effusions, pneumothorax, or infultrate + bronchial thickening + increase vascular markings   ASSESSMENT/PLAN: Encounter Diagnoses  Name Primary?  . Cough Yes  . Wheeze   . Acute bronchitis    Declined foot xray Rx Medrol dose pack Rx Levaquin, Diflucan DC Doxycycline Rx Hydromet syrup Rx Albuterol neb solution ( switched nebs to 2.5 mg/3 ml )  Gross sideeffects, risk and benefits, and alternatives of medications d/w patient. Patient is aware that all medications have potential sideeffects and we are unable to predict every sideeffect or drug-drug interaction that may occur. F/u prn    , [place], DO 11/23/2012 6:23 PM

## 2012-11-24 ENCOUNTER — Other Ambulatory Visit: Payer: Self-pay | Admitting: Physician Assistant

## 2012-11-25 ENCOUNTER — Other Ambulatory Visit: Payer: Self-pay | Admitting: Emergency Medicine

## 2012-11-29 ENCOUNTER — Ambulatory Visit: Payer: BC Managed Care – PPO | Admitting: Internal Medicine

## 2012-12-28 ENCOUNTER — Other Ambulatory Visit: Payer: Self-pay | Admitting: Physician Assistant

## 2013-01-11 ENCOUNTER — Ambulatory Visit (INDEPENDENT_AMBULATORY_CARE_PROVIDER_SITE_OTHER): Payer: BC Managed Care – PPO | Admitting: Physician Assistant

## 2013-01-11 VITALS — BP 118/82 | HR 74 | Temp 97.9°F | Resp 18 | Ht 62.75 in | Wt 137.0 lb

## 2013-01-11 DIAGNOSIS — M461 Sacroiliitis, not elsewhere classified: Secondary | ICD-10-CM

## 2013-01-11 DIAGNOSIS — F329 Major depressive disorder, single episode, unspecified: Secondary | ICD-10-CM

## 2013-01-11 DIAGNOSIS — F411 Generalized anxiety disorder: Secondary | ICD-10-CM

## 2013-01-11 DIAGNOSIS — M549 Dorsalgia, unspecified: Secondary | ICD-10-CM

## 2013-01-11 MED ORDER — CYCLOBENZAPRINE HCL 10 MG PO TABS: 5.0000 mg | ORAL_TABLET | Freq: Three times a day (TID) | ORAL | Status: DC | PRN

## 2013-01-11 MED ORDER — SERTRALINE HCL 50 MG PO TABS: 75.0000 mg | ORAL_TABLET | Freq: Every day | ORAL | Status: DC

## 2013-01-11 MED ORDER — MELOXICAM 7.5 MG PO TABS: 7.5000 mg | ORAL_TABLET | Freq: Every day | ORAL | Status: DC | PRN

## 2013-01-11 NOTE — Progress Notes (Signed)
Subjective:    Patient ID: [name], female    DOB: 12-24-1960, 52 y.o.   MRN: [id number]  HPI 52 y.o. Female presents to clinic today for medication refill on her Zoloft and for Sciatica. Pt has been taking Zoloft for several years now which she says helps neutralize her emotions and depression. She is tolerating medication well and does not feel that she needs any changes. She has no thoughts of self harm. She does say that she is having some trouble sleeping but will try taking her Ambien more consistently for that. She is otherwise tolerating her medications well.  Pt has been having low back pain and sciatic pain for over 1 year now. She was seeing a chiropractor for this but felt that it was not really helping because when she would stop her pain would come back. She saw Dr. Everlene Farrier last year (10/2011) and he gave her Meloxicam and Flexeril to help with discomfort. She says she will try taking those on a more regular basis and would like refills on them. She currently has only been taking Ibuprofen occasionally. She intends to try yoga as well. She denies numbness and tingling in lower extremities, no loss of bowels or urination, and no decreased ROM.   Review of Systems  Constitutional: Negative for fever, chills, activity change and fatigue.  HENT: Positive for congestion, rhinorrhea and postnasal drip. Negative for sore throat and sinus pressure.   Respiratory: Negative for cough, chest tightness and shortness of breath.   Cardiovascular: Negative for chest pain and palpitations.  Gastrointestinal: Negative for nausea, vomiting, abdominal pain and diarrhea.  Musculoskeletal: Negative for myalgias and arthralgias.       Objective:   Physical Exam  Nursing note and vitals reviewed. Constitutional: She is oriented to person, place, and time. Vital signs are normal. She appears well-developed and well-nourished.  HENT:  Head: Normocephalic and atraumatic.  Right Ear: External ear  normal.  Left Ear: External ear normal.  Nose: Mucosal edema and rhinorrhea present. No sinus tenderness.  Mouth/Throat: Oropharynx is clear and moist. No oropharyngeal exudate.  Eyes: Conjunctivae are normal.  Neck: Normal range of motion. Neck supple. No thyromegaly present.  Cardiovascular: Normal rate, regular rhythm and normal heart sounds.   Pulmonary/Chest: Effort normal and breath sounds normal.  Musculoskeletal: Normal range of motion. She exhibits tenderness (SI joint, Right lower back ).       Lumbar back: She exhibits tenderness and pain. She exhibits normal range of motion, no swelling and no spasm.  Negative Straight leg raise. No decreased ROM. No loss of sensation or decreased strength.  Lymphadenopathy:    She has no cervical adenopathy.  Neurological: She is alert and oriented to person, place, and time. She has normal strength and normal reflexes. No cranial nerve deficit or sensory deficit.  Skin: Skin is warm, dry and intact. No rash noted. She is not diaphoretic. No pallor.  Psychiatric: She has a normal mood and affect. Her behavior is normal. Judgment and thought content normal.       Assessment & Plan:  Back pain/SACROILIITIS NEC - Plan: cyclobenzaprine (FLEXERIL) 10 MG tablet, meloxicam (MOBIC) 7.5 MG tablet, Ambulatory referral to Physical Therapy  ANXIETY/DEPRESSION - Plan: sertraline (ZOLOFT) 50 MG tablet  Instructed patient to continue current medications as directed. Ambulatory PT will contact her to set up and appointment for her sciatica. In addition, encouraged her to stretch, try yoga, and take Mobic and Flexeril. She may follow up as needed.

## 2013-01-11 NOTE — Patient Instructions (Addendum)
Keep up the good work! Continue to take your medications as directed We refilled your Zoloft, Meloxicam, and Flexeril and they are available at your pharmacy We have made you a referral for Ambulatory PT and they will call you to set up an appointment Try yoga and stretching to help with sciatic pain

## 2013-01-18 NOTE — Progress Notes (Signed)
I have examined this patient along with the student and agree.  

## 2013-01-23 ENCOUNTER — Telehealth: Payer: Self-pay | Admitting: Internal Medicine

## 2013-01-23 MED ORDER — PREDNISONE 10 MG PO TABS: 10.0000 mg | ORAL_TABLET | Freq: Every day | ORAL | Status: DC

## 2013-01-23 NOTE — Telephone Encounter (Signed)
Per CY-give Prednisone 10 mg #7 take 1 po qd no refills.

## 2013-01-23 NOTE — Telephone Encounter (Signed)
Pt states she is miserable with her symptoms of itchy eyes, and itchy runny nose and has allergy testing set for 10-1 but is asking if there is anything sooner. Please advise. Mettler Bing, CMA

## 2013-01-23 NOTE — Telephone Encounter (Signed)
Spoke with the pt and notified of recs per [facility] Rx was sent to pharm

## 2013-01-26 ENCOUNTER — Other Ambulatory Visit: Payer: Self-pay | Admitting: *Deleted

## 2013-01-26 MED ORDER — MERCAPTOPURINE 50 MG PO TABS: 50.0000 mg | ORAL_TABLET | Freq: Every day | ORAL | Status: DC

## 2013-02-27 ENCOUNTER — Other Ambulatory Visit: Payer: Self-pay

## 2013-02-27 MED ORDER — ENALAPRIL MALEATE 5 MG PO TABS: 5.0000 mg | ORAL_TABLET | Freq: Every day | ORAL | Status: DC

## 2013-02-28 ENCOUNTER — Ambulatory Visit (INDEPENDENT_AMBULATORY_CARE_PROVIDER_SITE_OTHER): Payer: BC Managed Care – PPO | Admitting: Emergency Medicine

## 2013-02-28 ENCOUNTER — Ambulatory Visit: Payer: BC Managed Care – PPO

## 2013-02-28 ENCOUNTER — Ambulatory Visit: Payer: BC Managed Care – PPO | Admitting: Internal Medicine

## 2013-02-28 VITALS — BP 120/80 | HR 67 | Temp 98.2°F | Resp 18 | Ht 63.0 in | Wt 140.0 lb

## 2013-02-28 DIAGNOSIS — IMO0002 Reserved for concepts with insufficient information to code with codable children: Secondary | ICD-10-CM

## 2013-02-28 DIAGNOSIS — Z23 Encounter for immunization: Secondary | ICD-10-CM

## 2013-02-28 DIAGNOSIS — S8000XA Contusion of unspecified knee, initial encounter: Secondary | ICD-10-CM

## 2013-02-28 DIAGNOSIS — M25569 Pain in unspecified knee: Secondary | ICD-10-CM

## 2013-02-28 DIAGNOSIS — M25561 Pain in right knee: Secondary | ICD-10-CM

## 2013-02-28 DIAGNOSIS — S8001XA Contusion of right knee, initial encounter: Secondary | ICD-10-CM

## 2013-02-28 DIAGNOSIS — S80211A Abrasion, right knee, initial encounter: Secondary | ICD-10-CM

## 2013-02-28 NOTE — Patient Instructions (Addendum)
Tetanus, Diphtheria (Td) or Tetanus, Diphtheria, Pertussis (Tdap) Vaccine What You Need to Know WHY GET VACCINATED? Tetanus, diphtheria and pertussis can be very serious diseases. TETANUS (Lockjaw) causes painful muscle spasms and stiffness, usually all over the body.  Tetanus can lead to tightening of muscles in the head and neck so the victim cannot open his mouth or swallow, or sometimes even breathe. Tetanus kills about 1 out of 5 people who are infected. DIPHTHERIA can cause a thick membrane to cover the back of the throat.  Diphtheria can lead to breathing problems, paralysis, heart failure, and even death. PERTUSSIS (Whooping Cough) causes severe coughing spells which can lead to difficulty breathing, vomiting, and disturbed sleep.  Pertussis can lead to weight loss, incontinence, rib fractures and passing out from violent coughing. Up to 2 in 100 adolescents and 5 in 100 adults with pertussis are hospitalized or have complications, including pneumonia and death. These 3 diseases are all caused by bacteria. Diphtheria and pertussis are spread from person to person. Tetanus enters the body through cuts, scratches, or wounds. [place] saw as many as 200,000 cases a year of diphtheria and pertussis before vaccines were available, and hundreds of cases of tetanus. Since then, tetanus and diphtheria cases have dropped by about 99% and pertussis cases by about 92%. Children 72 years of age and younger get DTaP vaccine to protect them from these three diseases. But older children, adolescents, and adults need protection too. VACCINES FOR ADOLESCENTS AND ADULTS: TD AND TDAP Two vaccines are available to protect people 26 years of age and older from these diseases:  Td vaccine has been used for many years. It protects against tetanus and diphtheria.  Tdap vaccine was licensed in 0626. It is the first vaccine for adolescents and adults that protects against pertussis as well as tetanus and  diphtheria. A Td booster dose is recommended every 10 years. Tdap is given only once. WHICH VACCINE, AND WHEN? Ages 7 through 18 years  A dose of Tdap is recommended at age 27 or 31. This dose could be given as early as age 53 for children who missed one or more childhood doses of DTaP.  Children and adolescents who did not get a complete series of DTaP shots by age 58 should complete the series using a combination of Td and Tdap. Age 42 years and Older  All adults should get a booster dose of Td every 10 years. Adults under 65 who have never gotten Tdap should get a dose of Tdap as their next booster dose. Adults 65 and older may get one booster dose of Tdap.  Adults (including women who may become pregnant and adults 47 and older) who expect to have close contact with a baby younger than 75 months of age should get a dose of Tdap to help protect the baby from pertussis.  Healthcare professionals who have direct patient contact in hospitals or clinics should get one dose of Tdap. Protection After a Wound  A person who gets a severe cut or burn might need a dose of Td or Tdap to prevent tetanus infection. Tdap should be used for anyone who has never had a dose previously. Td should be used if Tdap is not available, or for:  Anybody who has already had a dose of Tdap.  Children 7 through 3 years of age who completed the childhood DTaP series.  Adults 78 and older. Pregnant Women  Pregnant women who have never had a dose of Tdap  should get one, after the 20th week of gestation and preferably during the 3rd trimester. If they do not get Tdap during their pregnancy they should get a dose as soon as possible after delivery. Pregnant women who have previously received Tdap and need tetanus or diphtheria vaccine while pregnant should get Td. Tdap and Td may be given at the same time as other vaccines. SOME PEOPLE SHOULD NOT BE VACCINATED OR SHOULD WAIT  Anyone who has had a life-threatening  allergic reaction after a dose of any tetanus, diphtheria, or pertussis containing vaccine should not get Td or Tdap.  Anyone who has a severe allergy to any component of a vaccine should not get that vaccine. Tell your doctor if the person getting the vaccine has any severe allergies.  Anyone who had a coma, or long or multiple seizures within 7 days after a dose of DTP or DTaP should not get Tdap, unless a cause other than the vaccine was found. These people may get Td.  Talk to your doctor if the person getting either vaccine:  Has epilepsy or another nervous system problem.  Had severe swelling or severe pain after a previous dose of DTP, DTaP, DT, Td, or Tdap vaccine.  Has had Guillain Barr Syndrome (GBS). Anyone who has a moderate or severe illness on the day the shot is scheduled should usually wait until they recover before getting Tdap or Td vaccine. A person with a mild illness or low fever can usually be vaccinated. WHAT ARE THE RISKS FROM TDAP AND TD VACCINES? With a vaccine, as with any medicine, there is always a small risk of a life-threatening allergic reaction or other serious problem. Brief fainting spells and related symptoms (such as jerking movements) can happen after any medical procedure, including vaccination. Sitting or lying down for about 15 minutes after a vaccination can help prevent fainting and injuries caused by falls. Tell your doctor if the patient feels dizzy or lightheaded, or has vision changes or ringing in the ears. Getting tetanus, diphtheria, or pertussis would be much more likely to lead to severe problems than getting either Td or Tdap vaccine. Problems reported after Td and Tdap vaccines are listed below. Mild Problems (noticeable, but did not interfere with activities) Tdap  Pain (about 3 in 4 adolescents and 2 in 3 adults).  Redness or swelling (about 1 in 5).  Mild fever of at least 100.4 F (38 C) (up to about 1 in 25 adolescents and 1 in  100 adults).  Headache (about 4 in 10 adolescents and 3 in 10 adults).  Tiredness (about 1 in 3 adolescents and 1 in 4 adults).  Nausea, vomiting, diarrhea, or stomach ache (up to 1 in 4 adolescents and 1 in 10 adults).  Chills, body aches, sore joints, rash, or swollen glands (uncommon). Td  Pain (up to about 8 in 10).  Redness or swelling at the injection site (up to about 1 in 3).  Mild fever (up to about 1 in 15).  Headache or tiredness (uncommon). Moderate Problems (interfered with activities, but did not require medical attention) Tdap  Pain at the injection site (about 1 in 20 adolescents and 1 in 100 adults).  Redness or swelling at the injection site (up to about 1 in 16 adolescents and 1 in 25 adults).  Fever over 102 F (38.9 C) (about 1 in 100 adolescents and 1 in 250 adults).  Headache (1 in 300).  Nausea, vomiting, diarrhea, or stomach ache (up to 3  in 100 adolescents and 1 in 100 adults). Td  Fever over 102 F (38.9 C) (rare). Tdap or Td  Extensive swelling of the arm where the shot was given (up to about 3 in 100). Severe Problems (Unable to perform usual activities; required medical attention) Tdap or Td  Swelling, severe pain, bleeding, and redness in the arm where the shot was given (rare). A severe allergic reaction could occur after any vaccine. They are estimated to occur less than once in a million doses. WHAT IF THERE IS A SEVERE REACTION? What should I look for? Any unusual condition, such as a severe allergic reaction or a high fever. If a severe allergic reaction occurred, it would be within a few minutes to an hour after the shot. Signs of a serious allergic reaction can include difficulty breathing, weakness, hoarseness or wheezing, a fast heartbeat, hives, dizziness, paleness, or swelling of the throat. What should I do?  Call a doctor, or get the person to a doctor right away.  Tell your doctor what happened, the date and time it  happened, and when the vaccination was given.  Ask your provider to report the reaction by filing a Vaccine Adverse Event Reporting System (VAERS) form. Or, you can file this report through the VAERS website at www.vaers.SamedayNews.es or by calling (786)231-1605. VAERS does not provide medical advice. THE NATIONAL VACCINE INJURY COMPENSATION PROGRAM The National Vaccine Injury Compensation Program (VICP) was created in 1986. Persons who believe they may have been injured by a vaccine can learn about the program and about filing a claim by calling (409) 446-1159 or visiting the [facility] website at GoldCloset.com.ee. HOW CAN I LEARN MORE?  Your doctor can give you the vaccine package insert or suggest other sources of information.  Call your local or state health department.  Contact the Centers for Disease Control and Prevention (CDC):  Call 575 388 6445 (1-800-CDC-INFO).  Visit the [facility] website at http://hunter.com/. CDC Td and Tdap Interim VIS (06/23/10) Document Released: 03/14/2006 Document Revised: 08/09/2011 Document Reviewed: 06/23/2010 ExitCare Patient Information 2014 [place], [place].

## 2013-02-28 NOTE — Progress Notes (Signed)
[facility] [address], [place] [address] [phone]  Date:  02/28/2013   Name:  [name]   DOB:  [birth year]   MRN:  [id number]  PCP:  Rollyn Scialdone,CHELLE, PA-C    Chief Complaint: Knee Injury   History of Present Illness:  [name] is a 52 y.o. very pleasant female patient who presents with the following:  Injured when she tripped on a curb and fell, landing on her right knee on Saturday.  Has persistent pain in her knee.  No locking or clicking.  No improvement with over the counter medications or other home remedies. Denies other complaint or health concern today.   Patient Active Problem List   Diagnosis Date Noted  . Erroneous encounter - disregard 01/08/2012  . CAD (coronary artery disease) 10/10/2011  . At risk for coronary artery disease 03/19/2011  . Type II or unspecified type diabetes mellitus without mention of complication, not stated as uncontrolled 02/25/2010  . ESSENTIAL HYPERTENSION, BENIGN 08/18/2009  . CHEST PAIN UNSPECIFIED 08/01/2009  . B12 DEFICIENCY 01/06/2009  . HYPERLIPIDEMIA 03/16/2007  . ANXIETY 03/16/2007  . DEPRESSION 03/16/2007  . Seasonal and perennial allergic rhinitis 03/16/2007  . GERD 03/16/2007  . CHOLELITHIASIS W/O CHOLECYSTITIS W/O OBST 03/16/2007  . SACROILIITIS NEC 03/16/2007  . FIBROMYALGIA 03/16/2007  . PALPITATIONS 03/16/2007  . CROHN'S DISEASE, HX OF 03/16/2007    Past Medical History  Diagnosis Date  . Vitamin B deficiency   . Anxiety state, unspecified   . Palpitations   . Depressive disorder, not elsewhere classified   . Other and unspecified hyperlipidemia   . Esophageal reflux   . Calculus of gallbladder without mention of cholecystitis or obstruction   . Fibromyalgia   . Crohn disease   . Myalgia and myositis, unspecified   . Allergic rhinitis due to pollen   . Unspecified essential hypertension   . Diabetes mellitus without complication     Past Surgical History  Procedure  Laterality Date  . Tonsillectomy and adenoidectomy    . Tubal ligation      BILATERAL  . Exploratory laparotomy    . Esophagogastroduodenoscopy      History  Substance Use Topics  . Smoking status: Former Smoker -- 1.00 packs/day for 35 years    Types: Cigarettes    Quit date: 05/31/2004  . Smokeless tobacco: Never Used  . Alcohol Use: No    Family History  Problem Relation Age of Onset  . Lung cancer Maternal Grandmother   . Stroke Father   . Hypertension Mother   . Heart attack Mother   . Heart disease Mother   . Hypertension Brother   . Heart disease Brother   . Coronary artery disease Brother   . Hypertension Sister   . Heart disease Sister   . Colon cancer Neg Hx     No Known Allergies  Medication list has been reviewed and updated.  Current Outpatient Prescriptions on File Prior to Visit  Medication Sig Dispense Refill  . albuterol (PROVENTIL) (5 MG/ML) 0.5% nebulizer solution Take 0.5 mLs (2.5 mg total) by nebulization every 6 (six) hours as needed for wheezing or shortness of breath.  20 mL  0  . aspirin 81 MG chewable tablet Chew 81 mg by mouth daily.        . cyanocobalamin (,VITAMIN B-12,) 1000 MCG/ML injection Inject 1 mL (1,000 mcg total) into the muscle every 30 (thirty) days.  6 mL  0  . cyclobenzaprine (  FLEXERIL) 10 MG tablet Take 0.5-1 tablets (5-10 mg total) by mouth 3 (three) times daily as needed for muscle spasms.  30 tablet  0  . enalapril (VASOTEC) 5 MG tablet Take 1 tablet (5 mg total) by mouth daily.  90 tablet  2  . esomeprazole (NEXIUM) 40 MG capsule Take 1 capsule (40 mg total) by mouth daily.  30 capsule  3  . fluconazole (DIFLUCAN) 150 MG tablet Take 1 tablet (150 mg total) by mouth once.  1 tablet  0  . glucose blood test strip Test once daily, needs office visit for more  32 each  0  . HYDROcodone-homatropine (HYCODAN) 5-1.5 MG/5ML syrup Take 5 mLs by mouth at bedtime as needed for cough.  120 mL  0  . levofloxacin (LEVAQUIN) 500 MG  tablet Take 1 tablet (500 mg total) by mouth daily.  7 tablet  0  . meloxicam (MOBIC) 7.5 MG tablet Take 1-2 tablets (7.5-15 mg total) by mouth daily as needed for pain.  30 tablet  3  . mercaptopurine (PURINETHOL) 50 MG tablet Take 1 tablet (50 mg total) by mouth daily.  30 tablet  1  . rosuvastatin (CRESTOR) 5 MG tablet Take 1 tablet (5 mg total) by mouth daily.  30 tablet  3  . sertraline (ZOLOFT) 50 MG tablet Take 1.5 tablets (75 mg total) by mouth daily.  45 tablet  5  . zolpidem (AMBIEN) 10 MG tablet Take 10 mg by mouth at bedtime as needed.        . methylPREDNISolone (MEDROL DOSEPAK) 4 MG tablet follow package directions  21 tablet  0  . predniSONE (DELTASONE) 10 MG tablet Take 1 tablet (10 mg total) by mouth daily.  7 tablet  0   No current facility-administered medications on file prior to visit.    Review of Systems:  As per HPI, otherwise negative.    Physical Examination: Filed Vitals:   02/28/13 1627  BP: 120/80  Pulse: 67  Temp: 98.2 F (36.8 C)  Resp: 18   Filed Vitals:   02/28/13 1627  Height: 5' 3"  (1.6 m)  Weight: 140 lb (63.504 kg)   Body mass index is 24.81 kg/(m^2). Ideal Body Weight: Weight in (lb) to have BMI = 25: 140.8   GEN: WDWN, NAD, Non-toxic, Alert & Oriented x 3 HEENT: Atraumatic, Normocephalic.  Ears and Nose: No external deformity. EXTR: No clubbing/cyanosis/edema NEURO: Normal gait.  PSYCH: Normally interactive. Conversant. Not depressed or anxious appearing.  Calm demeanor.  SKIN:  Abrasions lateral RIGHT knee KNEE:  Tender and full ROM knee.  Joint stable. No effusion   Assessment and Plan: Abrasion and contusion knee Anaprox TD Signed,  Ellison Carwin, MD   [facility] reading (PRIMARY) by  Dr. Ouida Sills.  negative.

## 2013-03-06 ENCOUNTER — Other Ambulatory Visit: Payer: Self-pay

## 2013-03-06 MED ORDER — ROSUVASTATIN CALCIUM 5 MG PO TABS: 5.0000 mg | ORAL_TABLET | Freq: Every day | ORAL | Status: DC

## 2013-03-28 ENCOUNTER — Telehealth: Payer: Self-pay | Admitting: Cardiology

## 2013-03-28 DIAGNOSIS — I2510 Atherosclerotic heart disease of native coronary artery without angina pectoris: Secondary | ICD-10-CM

## 2013-03-28 DIAGNOSIS — E785 Hyperlipidemia, unspecified: Secondary | ICD-10-CM

## 2013-03-28 NOTE — Telephone Encounter (Signed)
If she can tolerate atorvastatin, go back to atorvastatin 40 mg daily.  If not, would try pravastatin 80 mg daily.  Lipids/LFTs in 2 months.

## 2013-03-28 NOTE — Telephone Encounter (Signed)
Called stating she wants to change her Crestor to a cheaper medication.  Just got a month supply and is very expensive.  Advised Dr. Aundra Dubin is not in office today so will forward request to him and his nurse-Anne Lankford.  States she is fine with that since just had them filled.  She has tried Liptior but caused muscle aches.

## 2013-03-28 NOTE — Telephone Encounter (Signed)
New problem   Pt would like to change her CRESTOR   Med to something cheaper.   Please give pt a call   Thanks!

## 2013-03-29 MED ORDER — PRAVASTATIN SODIUM 80 MG PO TABS: 80.0000 mg | ORAL_TABLET | Freq: Every evening | ORAL | Status: DC

## 2013-04-02 ENCOUNTER — Other Ambulatory Visit: Payer: Self-pay | Admitting: *Deleted

## 2013-04-02 MED ORDER — MERCAPTOPURINE 50 MG PO TABS: 50.0000 mg | ORAL_TABLET | Freq: Every day | ORAL | Status: DC

## 2013-04-18 NOTE — Telephone Encounter (Signed)
error 

## 2013-05-31 DIAGNOSIS — J189 Pneumonia, unspecified organism: Secondary | ICD-10-CM

## 2013-05-31 HISTORY — DX: Pneumonia, unspecified organism: J18.9

## 2013-06-22 ENCOUNTER — Telehealth: Payer: Self-pay | Admitting: Cardiology

## 2013-06-22 NOTE — Telephone Encounter (Signed)
Lab appointment for 1/27 has already been cancelled.

## 2013-06-22 NOTE — Telephone Encounter (Signed)
New problem:  Pt calling in and wanted to make Dr. Aundra Dubin aware.... She was scheduled for labs on 1/27 for a lipid and liver check.. Pt states she can have those drawn at her work for free... Pt will send a copy to [place]

## 2013-06-26 ENCOUNTER — Other Ambulatory Visit: Payer: BC Managed Care – PPO

## 2013-06-27 ENCOUNTER — Encounter: Payer: Self-pay | Admitting: Cardiology

## 2013-06-29 ENCOUNTER — Other Ambulatory Visit: Payer: Self-pay | Admitting: *Deleted

## 2013-06-29 MED ORDER — MERCAPTOPURINE 50 MG PO TABS: 50.0000 mg | ORAL_TABLET | Freq: Every day | ORAL | Status: DC

## 2013-07-04 ENCOUNTER — Telehealth: Payer: Self-pay | Admitting: Internal Medicine

## 2013-07-04 NOTE — Telephone Encounter (Signed)
Patient advised that refills were sent to her pharmacy on 06/29/13 with 1 refill. She should have enough until August 27, 2013.

## 2013-07-05 ENCOUNTER — Other Ambulatory Visit: Payer: Self-pay | Admitting: *Deleted

## 2013-07-05 MED ORDER — ATORVASTATIN CALCIUM 20 MG PO TABS: 20.0000 mg | ORAL_TABLET | Freq: Every day | ORAL | Status: DC

## 2013-07-11 ENCOUNTER — Other Ambulatory Visit: Payer: Self-pay | Admitting: Physician Assistant

## 2013-08-10 ENCOUNTER — Other Ambulatory Visit: Payer: Self-pay | Admitting: Emergency Medicine

## 2013-08-14 ENCOUNTER — Ambulatory Visit (INDEPENDENT_AMBULATORY_CARE_PROVIDER_SITE_OTHER): Payer: BC Managed Care – PPO | Admitting: Internal Medicine

## 2013-08-14 ENCOUNTER — Encounter: Payer: Self-pay | Admitting: Internal Medicine

## 2013-08-14 VITALS — BP 110/70 | HR 86 | Ht 63.0 in | Wt 139.2 lb

## 2013-08-14 DIAGNOSIS — K5000 Crohn's disease of small intestine without complications: Secondary | ICD-10-CM

## 2013-08-14 DIAGNOSIS — K529 Noninfective gastroenteritis and colitis, unspecified: Secondary | ICD-10-CM

## 2013-08-14 DIAGNOSIS — K5080 Crohn's disease of both small and large intestine without complications: Secondary | ICD-10-CM

## 2013-08-14 MED ORDER — METRONIDAZOLE 250 MG PO TABS: 250.0000 mg | ORAL_TABLET | Freq: Three times a day (TID) | ORAL | Status: DC

## 2013-08-14 MED ORDER — PREDNISONE 10 MG PO TABS: ORAL_TABLET | ORAL | Status: DC

## 2013-08-14 MED ORDER — MERCAPTOPURINE 50 MG PO TABS: 50.0000 mg | ORAL_TABLET | Freq: Every day | ORAL | Status: DC

## 2013-08-14 NOTE — Patient Instructions (Signed)
Your physician has requested that you have the following lab work. Per your request, you should get these completed at work and have results faxed to [phone]: CBC, B12, IBC, CMET  We have sent the following medications to your pharmacy for you to pick up at your convenience: Flagyl 250 three times daily x 1 week 6MP  Dr Olevia Perches has advised that you be on a prednisone taper. The taper instructions are as follows: 20 mg daily x 1 week 15 mg x 1 week 10 mg x 1 week 5 mg x 1 week Discontinue  You will be due for a recall colonoscopy in 07/2014. We will send you a reminder in the mail when it gets closer to that time.  WI:[id number] Jacqulynn Cadet, PA-C

## 2013-08-14 NOTE — Progress Notes (Signed)
[name] [birth year] [id number]  Note: This dictation was prepared with Dragon digital system. Any transcriptional errors that result from this procedure are unintentional.   History of Present Illness:  This is a 53 year old white female with Crohn's ileitis since 1991, who has never had surgery. She has a mild constriction at the ileo-cecal valve. She has never had a small bowel obstruction. She has been on 6-MP 50 mg daily. Her last colonoscopy in March 2011 showed mild narrowing at the ileocecal valve. She also had terminal ileitis on biopsies and active colitis at the cecum. She has intermittent diarrhea but denies weight loss. She is treated for gastroesophageal reflux disease with Nexium 40 mg daily. Her last upper endoscopy in March 2011 showed esophagitis with linear erosions. An upper abdominal ultrasound in April 2009 showed inhomogeneous liver parenchyma, spleen measures 9.3 cm. There was a fold in the gallbladder. She has been on B12 supplements monthly.    Past Medical History  Diagnosis Date  . Vitamin B deficiency   . Anxiety state, unspecified   . Palpitations   . Depressive disorder, not elsewhere classified   . Other and unspecified hyperlipidemia   . Esophageal reflux   . Calculus of gallbladder without mention of cholecystitis or obstruction   . Fibromyalgia   . Crohn disease   . Myalgia and myositis, unspecified   . Allergic rhinitis due to pollen   . Unspecified essential hypertension   . Diabetes mellitus without complication     Past Surgical History  Procedure Laterality Date  . Tonsillectomy and adenoidectomy    . Tubal ligation      BILATERAL  . Exploratory laparotomy    . Esophagogastroduodenoscopy      Allergies  Allergen Reactions  . Lipitor [Atorvastatin]     Causes severe muscle ache    Family history and social history have been reviewed.  Review of Systems: Occasional heartburn. This denies dysphagia. Denies rectal bleeding  The  remainder of the 10 point ROS is negative except as outlined in the H&P  Physical Exam: General Appearance Well developed, in no distress Eyes  Non icteric  HEENT  Non traumatic, normocephalic  Mouth No lesion, tongue papillated, no cheilosis Neck Supple without adenopathy, thyroid not enlarged, no carotid bruits, no JVD Lungs Clear to auscultation bilaterally COR Normal S1, normal S2, regular rhythm, no murmur, quiet precordium Abdomen protuberant with decreased muscle tone. No ascites. No tenderness except in the right lower quadrant Rectal normal rectal sphincter tone. Soft Hemoccult negative stool Extremities  No pedal edema Skin No lesions Neurological Alert and oriented x 3 Psychological Normal mood and affect  Assessment and Plan:   Problem #1 Crohn's ileocolitis confined to the ileocecal valve and terminal ileum of 25 years duration. She has a mild stricture at the terminal ileum but it is not clinically significant. She has never had a small bowel obstruction. She will remain on 6-MP 50 mg daily. We will give her a short course of prednisone taper starting at 20 mg reduced by 5 mg every week. She will also give her Flagyl 250 mg 3 times a day for one week for bacterial overgrowth. We are checking her metabolic panel, iron studies, B12 and CBC which will be drawn at her workplace. I will see her in one year. A recall colonoscopy will be due in March 2016.    [name] 08/14/2013

## 2013-08-15 ENCOUNTER — Other Ambulatory Visit: Payer: Self-pay | Admitting: Emergency Medicine

## 2013-08-17 LAB — LIPID PANEL
Cholesterol: 195 mg/dL (ref 0–200)
HDL: 50 mg/dL (ref 35–70)
LDL Cholesterol: 127 mg/dL
Triglycerides: 135 mg/dL (ref 40–160)

## 2013-08-17 LAB — HEPATIC FUNCTION PANEL
ALT: 27 U/L (ref 7–35)
AST: 18 U/L (ref 13–35)
Alkaline Phosphatase: 111 U/L (ref 25–125)
BILIRUBIN, TOTAL: 0.4 mg/dL

## 2013-08-17 LAB — BASIC METABOLIC PANEL
BUN: 14 mg/dL (ref 4–21)
CREATININE: 0.6 mg/dL (ref 0.5–1.1)
GLUCOSE: 175 mg/dL
POTASSIUM: 4.4 mmol/L (ref 3.4–5.3)
SODIUM: 143 mmol/L (ref 137–147)

## 2013-08-17 LAB — HEMOGLOBIN A1C: Hgb A1c MFr Bld: 7.3 % — AB (ref 4.0–6.0)

## 2013-08-23 ENCOUNTER — Ambulatory Visit (INDEPENDENT_AMBULATORY_CARE_PROVIDER_SITE_OTHER): Payer: BC Managed Care – PPO | Admitting: Physician Assistant

## 2013-08-23 ENCOUNTER — Encounter: Payer: Self-pay | Admitting: Physician Assistant

## 2013-08-23 VITALS — BP 110/71 | HR 77 | Temp 98.7°F | Resp 16 | Ht 63.0 in | Wt 139.0 lb

## 2013-08-23 DIAGNOSIS — I2510 Atherosclerotic heart disease of native coronary artery without angina pectoris: Secondary | ICD-10-CM

## 2013-08-23 DIAGNOSIS — F3289 Other specified depressive episodes: Secondary | ICD-10-CM

## 2013-08-23 DIAGNOSIS — Z23 Encounter for immunization: Secondary | ICD-10-CM

## 2013-08-23 DIAGNOSIS — E119 Type 2 diabetes mellitus without complications: Secondary | ICD-10-CM

## 2013-08-23 DIAGNOSIS — F329 Major depressive disorder, single episode, unspecified: Secondary | ICD-10-CM

## 2013-08-23 DIAGNOSIS — F411 Generalized anxiety disorder: Secondary | ICD-10-CM

## 2013-08-23 DIAGNOSIS — E785 Hyperlipidemia, unspecified: Secondary | ICD-10-CM

## 2013-08-23 MED ORDER — SERTRALINE HCL 50 MG PO TABS: 75.0000 mg | ORAL_TABLET | Freq: Every day | ORAL | Status: DC

## 2013-08-23 NOTE — Progress Notes (Signed)
Subjective:    Patient ID: [name], female    DOB: 1961/01/06, 53 y.o.   MRN: [id number]   PCP: Tavius Turgeon, PA-C  Chief Complaint  Patient presents with  . Medication Refill   Medications, allergies, past medical history, surgical history, family history, social history and problem list reviewed and updated.  Patient Active Problem List   Diagnosis Date Noted  . CAD (coronary artery disease) 10/10/2011  . At risk for coronary artery disease 03/19/2011  . Type II or unspecified type diabetes mellitus without mention of complication, not stated as uncontrolled 02/25/2010  . ESSENTIAL HYPERTENSION, BENIGN 08/18/2009  . CHEST PAIN UNSPECIFIED 08/01/2009  . B12 DEFICIENCY 01/06/2009  . HYPERLIPIDEMIA 03/16/2007  . ANXIETY 03/16/2007  . DEPRESSION 03/16/2007  . Seasonal and perennial allergic rhinitis 03/16/2007  . GERD 03/16/2007  . CHOLELITHIASIS W/O CHOLECYSTITIS W/O OBST 03/16/2007  . SACROILIITIS NEC 03/16/2007  . FIBROMYALGIA 03/16/2007  . PALPITATIONS 03/16/2007  . Crohn's disease 03/16/2007     HPI I last saw Karrin in 12/2012.  She hasn't had follow-up on her diabetes in quite some time.  She was unaware that she needed to follow-up on that, and scheduled today's visit to get refills of sertraline.  Her mood is stable.  She's had some hot flushing, weight gain, increased appetite and irritability recently, as she's on a long course of prednisone for treatment of Crohn's Disease.  No thoughts of harming herself or others.  She receives specialty care from Dr. Aundra Dubin (Cardiology), Dr. Annamaria Boots (Pulmonology), Dr. Olevia Perches (Gastroenterology) and Dr. Ouida Sills (OBGYN).  Mammogram and pelvic exam are scheduled for 08/2013.  Review of Systems As above. Denies chest pain, shortness of breath, HA, dizziness, vision change, nausea, vomiting, diarrhea, constipation, melena, hematochezia, dysuria, increased urinary urgency or frequency, increased hunger or thirst, unintentional  weight change, unexplained myalgias or arthralgias, rash.     Objective:   Physical Exam  Vitals reviewed. Constitutional: She is oriented to person, place, and time. Vital signs are normal. She appears well-developed and well-nourished. She is active and cooperative. No distress.  BP 110/71  Pulse 77  Temp(Src) 98.7 F (37.1 C) (Oral)  Resp 16  Ht 5' 3"  (1.6 m)  Wt 139 lb (63.05 kg)  BMI 24.63 kg/m2  SpO2 95%  HENT:  Head: Normocephalic and atraumatic.  Right Ear: Hearing normal.  Left Ear: Hearing normal.  Eyes: Conjunctivae are normal. No scleral icterus.  Neck: Normal range of motion. Neck supple. No thyromegaly present.  Cardiovascular: Normal rate, regular rhythm and normal heart sounds.   Pulses:      Radial pulses are 2+ on the right side, and 2+ on the left side.  Pulmonary/Chest: Effort normal and breath sounds normal.  Lymphadenopathy:       Head (right side): No tonsillar, no preauricular, no posterior auricular and no occipital adenopathy present.       Head (left side): No tonsillar, no preauricular, no posterior auricular and no occipital adenopathy present.    She has no cervical adenopathy.       Right: No supraclavicular adenopathy present.       Left: No supraclavicular adenopathy present.  Neurological: She is alert and oriented to person, place, and time. No sensory deficit.  Skin: Skin is warm, dry and intact. No rash noted. No cyanosis or erythema. Nails show no clubbing.  See diabetic foot exam.   Psychiatric: She has a normal mood and affect.   She will have labs drawn at work: CBC  with diff, CMET, lipids, A1C, B12       Assessment & Plan:  1. Type II or unspecified type diabetes mellitus without mention of complication, not stated as uncontrolled Await labs. Add metfomin if needed. Continue efforts for healthy lifestyle changes. - Glucose, Fasting; Future - Hemoglobin A1C; Future - HM Diabetes Eye Exam - HM Diabetes Foot Exam  2.  HYPERLIPIDEMIA Continue lipitor. She's tolerating the lower dose. - Lipid panel; Future  3. CAD (coronary artery disease) Stable.  Continue follow-up with Dr. Aundra Dubin.  4. DEPRESSION 5. ANXIETY Stable. Continue current treatment. - sertraline (ZOLOFT) 50 MG tablet; Take 1.5 tablets (75 mg total) by mouth daily.  Dispense: 135 tablet; Refill: 5  6. Need for pneumococcal vaccination - Pneumococcal polysaccharide vaccine 23-valent greater than or equal to 2yo subcutaneous/IM  Fara Chute, PA-C Physician Assistant-Certified [facility]

## 2013-08-27 LAB — BASIC METABOLIC PANEL: Glucose: 169 mg/dL

## 2013-08-27 LAB — LIPID PANEL
CHOLESTEROL: 195 mg/dL (ref 0–200)
HDL: 50 mg/dL (ref 35–70)
LDL Cholesterol: 127 mg/dL
Triglycerides: 135 mg/dL (ref 40–160)

## 2013-08-27 LAB — HEMOGLOBIN A1C: HEMOGLOBIN A1C: 7.7 % — AB (ref 4.0–6.0)

## 2013-09-06 ENCOUNTER — Encounter: Payer: Self-pay | Admitting: Physician Assistant

## 2013-09-08 ENCOUNTER — Ambulatory Visit (INDEPENDENT_AMBULATORY_CARE_PROVIDER_SITE_OTHER): Payer: BC Managed Care – PPO | Admitting: Family Medicine

## 2013-09-08 VITALS — BP 106/70 | HR 79 | Temp 98.0°F | Resp 18 | Ht 63.0 in | Wt 133.4 lb

## 2013-09-08 DIAGNOSIS — E119 Type 2 diabetes mellitus without complications: Secondary | ICD-10-CM

## 2013-09-08 DIAGNOSIS — K5010 Crohn's disease of large intestine without complications: Secondary | ICD-10-CM

## 2013-09-08 DIAGNOSIS — J9801 Acute bronchospasm: Secondary | ICD-10-CM

## 2013-09-08 DIAGNOSIS — R197 Diarrhea, unspecified: Secondary | ICD-10-CM

## 2013-09-08 DIAGNOSIS — R110 Nausea: Secondary | ICD-10-CM

## 2013-09-08 DIAGNOSIS — R6889 Other general symptoms and signs: Secondary | ICD-10-CM

## 2013-09-08 LAB — COMPLETE METABOLIC PANEL WITH GFR
ALT: 17 U/L (ref 0–35)
AST: 14 U/L (ref 0–37)
Albumin: 3.9 g/dL (ref 3.5–5.2)
Alkaline Phosphatase: 66 U/L (ref 39–117)
BILIRUBIN TOTAL: 0.6 mg/dL (ref 0.2–1.2)
BUN: 15 mg/dL (ref 6–23)
CO2: 21 mEq/L (ref 19–32)
Calcium: 9.2 mg/dL (ref 8.4–10.5)
Chloride: 104 mEq/L (ref 96–112)
Creat: 0.67 mg/dL (ref 0.50–1.10)
GFR, Est African American: >89 mL/min
GFR, Est Non African American: >89 mL/min
Glucose, Bld: 90 mg/dL (ref 70–99)
POTASSIUM: 3.9 meq/L (ref 3.5–5.3)
Sodium: 139 mEq/L (ref 135–145)
Total Protein: 6.4 g/dL (ref 6.0–8.3)

## 2013-09-08 LAB — POCT URINALYSIS DIPSTICK
Bilirubin, UA: NEGATIVE
GLUCOSE UA: NEGATIVE
Leukocytes, UA: NEGATIVE
Nitrite, UA: NEGATIVE
Spec Grav, UA: 1.025
Urobilinogen, UA: 0.2
pH, UA: 6

## 2013-09-08 LAB — POCT CBC
Granulocyte percent: 58.3 %G (ref 37–80)
HCT, POC: 46.9 % (ref 37.7–47.9)
HEMOGLOBIN: 13.8 g/dL (ref 12.2–16.2)
Lymph, poc: 2.4 (ref 0.6–3.4)
MCH, POC: 28.5 pg (ref 27–31.2)
MCHC: 29.4 g/dL — AB (ref 31.8–35.4)
MCV: 96.9 fL (ref 80–97)
MID (cbc): 0.4 (ref 0–0.9)
MPV: 8.4 fL (ref 0–99.8)
POC Granulocyte: 3.9 (ref 2–6.9)
POC LYMPH PERCENT: 35.3 %L (ref 10–50)
POC MID %: 6.4 %M (ref 0–12)
Platelet Count, POC: 308 10*3/uL (ref 142–424)
RBC: 4.84 M/uL (ref 4.04–5.48)
RDW, POC: 14.4 %
WBC: 6.7 10*3/uL (ref 4.6–10.2)

## 2013-09-08 LAB — GLUCOSE, POCT (MANUAL RESULT ENTRY): POC Glucose: 89 mg/dl (ref 70–99)

## 2013-09-08 NOTE — Progress Notes (Addendum)
Subjective:    Patient ID: [name], female    DOB: [birth year], 53 y.o.   MRN: [id number]  Cough Associated symptoms include chills, ear pain, a fever, headaches, rhinorrhea and shortness of breath. Pertinent negatives include no sore throat.  Neck Pain  Associated symptoms include a fever and headaches.  Headache  Associated symptoms include coughing, ear pain, a fever, nausea, neck pain and rhinorrhea. Pertinent negatives include no sore throat or vomiting.  Urinary Frequency  Associated symptoms include chills, frequency and nausea. Pertinent negatives include no vomiting.   Chief Complaint  Patient presents with  . Cough    started on Monday  . Generalized Body Aches  . Neck Pain  . Headache  . Nausea    little diarrhea no vomiting  . Excessive Sweating  . Urinary Frequency   This chart was scribed for Wardell Honour, MD by Thea Alken, ED Scribe. This patient was seen in room 1 and the patient's care was started at 12:55 PM.  HPI Comments: [name] is a 53 y.o. female who presents to the [facility] complaining of multiple symptoms onset 5 days. Pt reports that her symptoms began with a fever of 99-101. Pt reports that her last fever was this morning and was 100. Pt reports nausea onset 2 days ago but denies emesis. Pt reports that she is unable to eat. Pt reports associated left ear pain, occasional SOB, rhinorrhea, cough, neck pain, body aches, HA, congestion, light headedness, diaphoresis, urinary frequency. Pt reports that her cough has gotten better. Pt denies taking medication for the cough. Pt reports that the neck pain started at the same time as her body aches. Pt reports 1-2 BM a day.  Pt reports that she was seen at an urgent care and was test for the flu which resulted positive. She reports that she was taking tamiflu. Pt denies sore throat and abdominal pain. Pt has h/o of crohn's colitis but denies similar symptoms. She reports that she  takes 62m of prednisone.  Pt reports that she received a flu shot this year. Pt denies asthma and COPD. Pt is a former smoker and quit 9 days ago.  Pt reports thaat her sugar was 165 this morning.   Past Medical History  Diagnosis Date  . Vitamin B deficiency   . Anxiety state, unspecified   . Palpitations   . Depressive disorder, not elsewhere classified   . Other and unspecified hyperlipidemia   . Esophageal reflux   . Calculus of gallbladder without mention of cholecystitis or obstruction   . Fibromyalgia   . Crohn disease   . Myalgia and myositis, unspecified   . Allergic rhinitis due to pollen   . Unspecified essential hypertension   . Diabetes mellitus without complication     diet controlled   No Known Allergies Prior to Admission medications   Medication Sig Start Date End Date Taking? Authorizing Provider  albuterol (PROVENTIL) (5 MG/ML) 0.5% nebulizer solution Take 0.5 mLs (2.5 mg total) by nebulization every 6 (six) hours as needed for wheezing or shortness of breath. 11/23/12  Yes Thao P Le, DO  aspirin 81 MG chewable tablet Chew 81 mg by mouth daily.     Yes Historical Provider, MD  atorvastatin (LIPITOR) 20 MG tablet Take 1 tablet (20 mg total) by mouth daily. 07/05/13  Yes DLarey Dresser MD  Co-Enzyme Q10 200 MG CAPS Take by mouth daily. 07/05/13  Yes DLarey Dresser MD  cyanocobalamin (,VITAMIN B-12,) 1000 MCG/ML injection Inject 1 mL (1,000 mcg total) into the muscle every 30 (thirty) days. 11/17/10  Yes Lafayette Dragon, MD  cyclobenzaprine (FLEXERIL) 10 MG tablet Take 0.5-1 tablets (5-10 mg total) by mouth 3 (three) times daily as needed for muscle spasms. 01/11/13  Yes Chelle S Jeffery, PA-C  enalapril (VASOTEC) 5 MG tablet Take 1 tablet (5 mg total) by mouth daily. 02/27/13  Yes Larey Dresser, MD  esomeprazole (NEXIUM) 40 MG capsule Take 1 capsule (40 mg total) by mouth daily. 09/12/12  Yes Lafayette Dragon, MD  levofloxacin (LEVAQUIN) 500 MG tablet Take 1 tablet (500 mg  total) by mouth daily. 11/23/12  Yes Thao P Le, DO  mercaptopurine (PURINETHOL) 50 MG tablet Take 1 tablet (50 mg total) by mouth daily. 08/14/13  Yes Lafayette Dragon, MD  predniSONE (DELTASONE) 10 MG tablet Take as directed 08/14/13  Yes Lafayette Dragon, MD  sertraline (ZOLOFT) 50 MG tablet Take 1.5 tablets (75 mg total) by mouth daily. 08/23/13  Yes Chelle S Jeffery, PA-C  zolpidem (AMBIEN) 10 MG tablet Take 10 mg by mouth at bedtime as needed.     Yes Historical Provider, MD     Review of Systems  Constitutional: Positive for fever, chills and diaphoresis.  HENT: Positive for congestion, ear pain and rhinorrhea. Negative for sore throat.   Respiratory: Positive for cough and shortness of breath.   Gastrointestinal: Positive for nausea and diarrhea. Negative for vomiting and blood in stool.  Genitourinary: Positive for frequency.  Musculoskeletal: Positive for neck pain.  Neurological: Positive for light-headedness and headaches.       Objective:   Physical Exam  Constitutional: She is oriented to person, place, and time. She appears well-developed and well-nourished. No distress.  HENT:  Head: Normocephalic and atraumatic.  Right Ear: External ear normal.  Left Ear: External ear normal.  Nose: Nose normal.  Mouth/Throat: Oropharynx is clear and moist.  Drainage in throat  Eyes: Conjunctivae and EOM are normal. Pupils are equal, round, and reactive to light.  Neck: Normal range of motion. Neck supple. Carotid bruit is not present. No tracheal deviation present. No thyromegaly present.  Cardiovascular: Normal rate, regular rhythm, normal heart sounds and intact distal pulses.  Exam reveals no gallop and no friction rub.   No murmur heard. Pulmonary/Chest: Effort normal. No respiratory distress. She has wheezes. She has no rales.  Abdominal: Soft. Bowel sounds are normal. She exhibits no distension and no mass. There is no tenderness. There is no rebound and no guarding.  Mild RLQ  tenderness  Musculoskeletal: Normal range of motion.  Lymphadenopathy:    She has no cervical adenopathy.  Neurological: She is alert and oriented to person, place, and time. No cranial nerve deficit.  Skin: Skin is warm and dry. No rash noted. She is not diaphoretic. No erythema. No pallor.  Psychiatric: She has a normal mood and affect. Her behavior is normal.  Nursing note and vitals reviewed.  Results for orders placed or performed in visit on 09/08/13  COMPLETE METABOLIC PANEL WITH GFR  Result Value Ref Range   Sodium 139 135 - 145 mEq/L   Potassium 3.9 3.5 - 5.3 mEq/L   Chloride 104 96 - 112 mEq/L   CO2 21 19 - 32 mEq/L   Glucose, Bld 90 70 - 99 mg/dL   BUN 15 6 - 23 mg/dL   Creat 0.67 0.50 - 1.10 mg/dL   Total Bilirubin 0.6 0.2 - 1.2  mg/dL   Alkaline Phosphatase 66 39 - 117 U/L   AST 14 0 - 37 U/L   ALT 17 0 - 35 U/L   Total Protein 6.4 6.0 - 8.3 g/dL   Albumin 3.9 3.5 - 5.2 g/dL   Calcium 9.2 8.4 - 10.5 mg/dL   GFR, Est African American >89 mL/min   GFR, Est Non African American >89 mL/min  POCT CBC  Result Value Ref Range   WBC 6.7 4.6 - 10.2 K/uL   Lymph, poc 2.4 0.6 - 3.4   POC LYMPH PERCENT 35.3 10 - 50 %L   MID (cbc) 0.4 0 - 0.9   POC MID % 6.4 0 - 12 %M   POC Granulocyte 3.9 2 - 6.9   Granulocyte percent 58.3 37 - 80 %G   RBC 4.84 4.04 - 5.48 M/uL   Hemoglobin 13.8 12.2 - 16.2 g/dL   HCT, POC 46.9 37.7 - 47.9 %   MCV 96.9 80 - 97 fL   MCH, POC 28.5 27 - 31.2 pg   MCHC 29.4 (A) 31.8 - 35.4 g/dL   RDW, POC 14.4 %   Platelet Count, POC 308 142 - 424 K/uL   MPV 8.4 0 - 99.8 fL  POCT glucose (manual entry)  Result Value Ref Range   POC Glucose 89 70 - 99 mg/dl  POCT urinalysis dipstick  Result Value Ref Range   Color, UA yellow    Clarity, UA clear    Glucose, UA neg    Bilirubin, UA neg    Ketones, UA trace    Spec Grav, UA 1.025    Blood, UA trace    pH, UA 6.0    Protein, UA trace    Urobilinogen, UA 0.2    Nitrite, UA neg    Leukocytes, UA  Negative    ALBUTEROL NEBULIZER ADMINISTERED IN OFFICE.    Assessment & Plan:    1. Flu-like symptoms   2. Bronchospasm   3. Crohn's colitis   4. Type II or unspecified type diabetes mellitus without mention of complication, not stated as uncontrolled   5. Nausea alone   6. Diarrhea     1. Influenza:  New; s/p Tamiflu therapy.  Recommend supportive care with Mucinex DM, rest, fluids. Current symptoms persistent of acute influenza infection.  RTC for acute worsening. 2. Bronchospasm: New. S/p Albuterol nebulizer in office with improvement.  Continue Albuterol tid scheduled for one week and then PRN. 3.  Crohn's colitis: currently stable without acute issues. 4.  DMII: stable recommend checking sugars closely with acute infection.  Normal glucose of 89 in office. 5 . Nausea with diarrhea:  New.  Normal WBC count.   Continue Phenergan PRN nausea.    No orders of the defined types were placed in this encounter.   I personally performed the services described in this documentation, which was scribed in my presence.  The recorded information has been reviewed and is accurate.  Reginia Forts, M.D.  [facility] [address] [place], [place]  [phone] phone [phone] fax

## 2013-09-08 NOTE — Patient Instructions (Signed)
1.  Take Phenergan every six hours as needed for nausea. 2. Increase nebulizer to three times daily for next week.

## 2013-10-04 ENCOUNTER — Other Ambulatory Visit: Payer: Self-pay | Admitting: Obstetrics and Gynecology

## 2013-10-11 ENCOUNTER — Other Ambulatory Visit: Payer: Self-pay | Admitting: Obstetrics and Gynecology

## 2013-10-30 ENCOUNTER — Ambulatory Visit: Payer: BC Managed Care – PPO

## 2013-10-30 ENCOUNTER — Ambulatory Visit (INDEPENDENT_AMBULATORY_CARE_PROVIDER_SITE_OTHER): Payer: BC Managed Care – PPO | Admitting: Internal Medicine

## 2013-10-30 VITALS — BP 108/74 | HR 60 | Temp 97.8°F | Resp 18 | Ht 62.0 in | Wt 133.2 lb

## 2013-10-30 DIAGNOSIS — M25559 Pain in unspecified hip: Secondary | ICD-10-CM

## 2013-10-30 DIAGNOSIS — M25539 Pain in unspecified wrist: Secondary | ICD-10-CM

## 2013-10-30 MED ORDER — MELOXICAM 15 MG PO TABS: 15.0000 mg | ORAL_TABLET | Freq: Every day | ORAL | Status: DC

## 2013-10-30 NOTE — Progress Notes (Signed)
Subjective:    Patient ID: [name], female    DOB: 1960-08-23, 53 y.o.   MRN: [id number]  HPI This chart was scribed for Leandrew Koyanagi, MD by Ladene Artist, ED Scribe. The patient was seen in room 12. Patient's care was started at 7:10 PM.  HPI Comments: [name] is a 53 y.o. female, with a h/o fibromyalgia, who presents to the [facility] complaining of intermittent sharp, L hip pain onset 2 weeks ago. She describes the pain as stabbing. Pain is exacerbated with certain movements and with leaning on the L side. She denies difficulty walking up stairs or getting out of a car. She also denies walking with a limp due to pain. She states that she can sleep on her L side without much pain.   Pt also presents with intermittent L hand pain, specifically at the base of the L thumb, onset a few weeks ago. She describes the pain as sharp and burning. She denies pain with twisting her hand and pushing away. She also denies tingling in her hand. Pain is exacerbated with carrying objects.   Past Medical History  Diagnosis Date  . Vitamin B deficiency   . Anxiety state, unspecified   . Palpitations   . Depressive disorder, not elsewhere classified   . Other and unspecified hyperlipidemia   . Esophageal reflux   . Calculus of gallbladder without mention of cholecystitis or obstruction   . Fibromyalgia   . Crohn disease   . Myalgia and myositis, unspecified   . Allergic rhinitis due to pollen   . Unspecified essential hypertension   . Diabetes mellitus without complication    Current Outpatient Prescriptions on File Prior to Visit  Medication Sig Dispense Refill  . albuterol (PROVENTIL) (5 MG/ML) 0.5% nebulizer solution Take 0.5 mLs (2.5 mg total) by nebulization every 6 (six) hours as needed for wheezing or shortness of breath.  20 mL  0  . aspirin 81 MG chewable tablet Chew 81 mg by mouth daily.        Marland Kitchen atorvastatin (LIPITOR) 20 MG tablet Take 1 tablet (20 mg  total) by mouth daily.  30 tablet  3  . Co-Enzyme Q10 200 MG CAPS Take by mouth daily.      . cyanocobalamin (,VITAMIN B-12,) 1000 MCG/ML injection Inject 1 mL (1,000 mcg total) into the muscle every 30 (thirty) days.  6 mL  0  . cyclobenzaprine (FLEXERIL) 10 MG tablet Take 0.5-1 tablets (5-10 mg total) by mouth 3 (three) times daily as needed for muscle spasms.  30 tablet  0  . enalapril (VASOTEC) 5 MG tablet Take 1 tablet (5 mg total) by mouth daily.  90 tablet  2  . esomeprazole (NEXIUM) 40 MG capsule Take 1 capsule (40 mg total) by mouth daily.  30 capsule  3  . levofloxacin (LEVAQUIN) 500 MG tablet Take 1 tablet (500 mg total) by mouth daily.  7 tablet  0  . mercaptopurine (PURINETHOL) 50 MG tablet Take 1 tablet (50 mg total) by mouth daily.  30 tablet  3  . sertraline (ZOLOFT) 50 MG tablet Take 1.5 tablets (75 mg total) by mouth daily.  135 tablet  5  . zolpidem (AMBIEN) 10 MG tablet Take 10 mg by mouth at bedtime as needed.         No current facility-administered medications on file prior to visit.   No Known Allergies  Review of Systems  Musculoskeletal: Positive for arthralgias.  Neurological: Negative for numbness.   inflammatory bowel diseases not currently active No past history of arthritis No fever No recent weight loss No skin rashes    Objective:   Physical Exam  Nursing note and vitals reviewed. Constitutional: She is oriented to person, place, and time. She appears well-developed and well-nourished. No distress.  HENT:  Head: Normocephalic and atraumatic.  Eyes: Conjunctivae and EOM are normal.  Neck: Normal range of motion.  Cardiovascular: Normal rate.   Pulmonary/Chest: Effort normal. No respiratory distress.  Musculoskeletal: Normal range of motion.       Left hip: She exhibits tenderness. She exhibits no swelling.       Left hand: She exhibits tenderness and swelling (mild).  L hip: Tender to palpation over the posterior lateral aspect No swelling Pain  on external rotation but she has good flexion on internal rotation L hand: Pain with palpation at the first [facility] with pain grip but with good flexion and extension against resistance DeQ negative  Mild swelling at the first [facility] without rendess  Neurological: She is alert and oriented to person, place, and time.  Skin: Skin is warm and dry.  Psychiatric: She has a normal mood and affect. Her behavior is normal.  [facility] reading (PRIMARY) by  Dr. Girard Cooter: mild degenerative changes at first [facility]; left hip is normal       Assessment & Plan:  Wrist pain - Plan: DG Wrist 2 Views Left  First left [facility] overuse arthritis is developing Hip pain - Plan: DG Hip Complete Left  Bursitis is the likely diagnosis Meds ordered this encounter  Medications  . meloxicam (MOBIC) 15 MG tablet    Sig: Take 1 tablet (15 mg total) by mouth daily.    Dispense:  30 tablet    Refill:  1   Thumb spica 3weeks//change carrying position at work F/u if not well   I personally performed the services described in this documentation, which was scribed in my presence. The recorded information has been reviewed and is accurate.

## 2013-11-26 ENCOUNTER — Other Ambulatory Visit: Payer: Self-pay

## 2013-11-26 MED ORDER — ATORVASTATIN CALCIUM 20 MG PO TABS: 20.0000 mg | ORAL_TABLET | Freq: Every day | ORAL | Status: DC

## 2013-12-24 ENCOUNTER — Other Ambulatory Visit: Payer: Self-pay | Admitting: *Deleted

## 2013-12-24 MED ORDER — MERCAPTOPURINE 50 MG PO TABS: 50.0000 mg | ORAL_TABLET | Freq: Every day | ORAL | Status: DC

## 2013-12-26 ENCOUNTER — Telehealth: Payer: Self-pay | Admitting: Internal Medicine

## 2013-12-27 MED ORDER — ESOMEPRAZOLE MAGNESIUM 40 MG PO CPDR: 40.0000 mg | DELAYED_RELEASE_CAPSULE | Freq: Every day | ORAL | Status: DC

## 2013-12-27 NOTE — Telephone Encounter (Signed)
I have spoken to patient. She states that she does NOT need 34m resent to pharmacy (as original message indicated). She states that she needs Nexium sent to the pharmacy. I will send rx.

## 2014-01-15 ENCOUNTER — Telehealth: Payer: Self-pay | Admitting: *Deleted

## 2014-01-15 NOTE — Telephone Encounter (Signed)
Spoke with [name] this morning and advised her to make an appointment for an 25-monthdiabetes check up.  Patient was given [facility] number and she stated she would call and schedule.

## 2014-01-29 ENCOUNTER — Encounter: Payer: Self-pay | Admitting: Internal Medicine

## 2014-02-06 ENCOUNTER — Ambulatory Visit (INDEPENDENT_AMBULATORY_CARE_PROVIDER_SITE_OTHER): Payer: BC Managed Care – PPO | Admitting: Physician Assistant

## 2014-02-06 VITALS — BP 100/62 | HR 64 | Temp 98.0°F | Resp 18 | Ht 63.0 in | Wt 133.0 lb

## 2014-02-06 DIAGNOSIS — M25532 Pain in left wrist: Secondary | ICD-10-CM

## 2014-02-06 DIAGNOSIS — F329 Major depressive disorder, single episode, unspecified: Secondary | ICD-10-CM

## 2014-02-06 DIAGNOSIS — E119 Type 2 diabetes mellitus without complications: Secondary | ICD-10-CM

## 2014-02-06 DIAGNOSIS — F3289 Other specified depressive episodes: Secondary | ICD-10-CM

## 2014-02-06 DIAGNOSIS — M25539 Pain in unspecified wrist: Secondary | ICD-10-CM

## 2014-02-06 DIAGNOSIS — R6884 Jaw pain: Secondary | ICD-10-CM

## 2014-02-06 DIAGNOSIS — F411 Generalized anxiety disorder: Secondary | ICD-10-CM

## 2014-02-06 MED ORDER — SERTRALINE HCL 50 MG PO TABS: 75.0000 mg | ORAL_TABLET | Freq: Every day | ORAL | Status: DC

## 2014-02-06 MED ORDER — CELECOXIB 200 MG PO CAPS: 200.0000 mg | ORAL_CAPSULE | Freq: Two times a day (BID) | ORAL | Status: DC

## 2014-02-06 NOTE — Progress Notes (Signed)
Subjective:    Patient ID: [name], female    DOB: Oct 25, 1960, 53 y.o.   MRN: [id number]   PCP: Mabeline Varas, PA-C  Chief Complaint  Patient presents with  . dm check  . Jaw Pain    left side pain up to ear wears mouth piece at night no help   . Wrist Pain    on going     Medications, allergies, past medical history, surgical history, family history, social history and problem list reviewed and updated.  Patient Active Problem List   Diagnosis Date Noted  . CAD (coronary artery disease) 10/10/2011  . At risk for coronary artery disease 03/19/2011  . Type II or unspecified type diabetes mellitus without mention of complication, not stated as uncontrolled 02/25/2010  . ESSENTIAL HYPERTENSION, BENIGN 08/18/2009  . CHEST PAIN UNSPECIFIED 08/01/2009  . B12 DEFICIENCY 01/06/2009  . HYPERLIPIDEMIA 03/16/2007  . ANXIETY 03/16/2007  . DEPRESSION 03/16/2007  . Seasonal and perennial allergic rhinitis 03/16/2007  . GERD 03/16/2007  . CHOLELITHIASIS W/O CHOLECYSTITIS W/O OBST 03/16/2007  . SACROILIITIS NEC 03/16/2007  . FIBROMYALGIA 03/16/2007  . PALPITATIONS 03/16/2007  . Crohn's disease 03/16/2007    Prior to Admission medications   Medication Sig Start Date End Date Taking? Authorizing Provider  aspirin 81 MG chewable tablet Chew 81 mg by mouth daily.     Yes Historical Provider, MD  atorvastatin (LIPITOR) 20 MG tablet Take 1 tablet (20 mg total) by mouth daily. 11/26/13  Yes Larey Dresser, MD  cyanocobalamin (,VITAMIN B-12,) 1000 MCG/ML injection Inject 1 mL (1,000 mcg total) into the muscle every 30 (thirty) days. 11/17/10  Yes Lafayette Dragon, MD  enalapril (VASOTEC) 5 MG tablet Take 1 tablet (5 mg total) by mouth daily. 02/27/13  Yes Larey Dresser, MD  esomeprazole (NEXIUM) 40 MG capsule Take 1 capsule (40 mg total) by mouth daily. 12/27/13  Yes Lafayette Dragon, MD  mercaptopurine (PURINETHOL) 50 MG tablet Take 1 tablet (50 mg total) by mouth daily. 12/24/13  Yes Lafayette Dragon, MD  sertraline (ZOLOFT) 50 MG tablet Take 1.5 tablets (75 mg total) by mouth daily. 08/23/13  Yes Syvilla Martin S [place] Noori, PA-C  zolpidem (AMBIEN) 10 MG tablet Take 10 mg by mouth at bedtime as needed.     Yes Historical Provider, MD  albuterol (PROVENTIL) (5 MG/ML) 0.5% nebulizer solution Take 0.5 mLs (2.5 mg total) by nebulization every 6 (six) hours as needed for wheezing or shortness of breath. 11/23/12   Thao P Le, DO  Co-Enzyme Q10 200 MG CAPS Take by mouth daily. 07/05/13   Larey Dresser, MD  cyclobenzaprine (FLEXERIL) 10 MG tablet Take 0.5-1 tablets (5-10 mg total) by mouth 3 (three) times daily as needed for muscle spasms. 01/11/13   Claribel Sachs S Keauna Brasel, PA-C    HPI  Last A1C was 7.4, which was up from the previous measurement.  She's been working hard on healthy eating and regular exercise. Frequency of home glucose monitoring: fasting 140-150's, post-prandial 103-150's Sees a dentist Q6 months, does not see an eye specialist annually. Checks feet daily. Is not current with influenza vaccine, will get it at work. Is current with pneumococcal vaccine.  Mild degenerative changes at the thumb 10/2013. Splinting isn't really helping.  Also complains of LEFT jaw pain. "I'm assuming it's from biting down. I can't open my mouth real wide. Sometimes I get shooting pains in my ear. My teeth hurt."  Wears an OTC mouth piece at night, but  it happens during the day. "Why am I stressed if I'm on zoloft?" Has a dental appointment in the next month or so.  Review of Systems As above.    Objective:   Physical Exam  Vitals reviewed. Constitutional: She is oriented to person, place, and time. She appears well-developed and well-nourished. No distress.  Eyes: Conjunctivae are normal. No scleral icterus.  Neck: No thyromegaly present.  Cardiovascular: Normal rate, regular rhythm, normal heart sounds and intact distal pulses.   Pulmonary/Chest: Effort normal and breath sounds normal.  Lymphadenopathy:     She has no cervical adenopathy.  Neurological: She is alert and oriented to person, place, and time.  Skin: Skin is warm and dry.  Psychiatric: She has a normal mood and affect. Her behavior is normal.   See diabetic foot exam. Normal.  Labs were done at work in July, but I don't have those records. She doesn't need refills today.      Assessment & Plan:  1. Type II or unspecified type diabetes mellitus without mention of complication, not stated as uncontrolled Proceed with Eye exam. Continue healthy eating and regular exercise.  As long as A1C remains less than 7%, I'm comfortable not starting metformin.  - [facility] Diabetes Eye Exam - [facility] Diabetes Foot Exam  2. Left wrist pain Continue splinting. Try Celebrex (selected due to GERD). Refer to Hand.  May benefit from an injection. - celecoxib (CELEBREX) 200 MG capsule; Take 1 capsule (200 mg total) by mouth 2 (two) times daily.  Dispense: 60 capsule; Refill: 1 - Ambulatory referral to Hand Surgery  3. Jaw pain Likely TMJ dysfunction. Discuss with her dentist next month. - celecoxib (CELEBREX) 200 MG capsule; Take 1 capsule (200 mg total) by mouth 2 (two) times daily.  Dispense: 60 capsule; Refill: 1  4. DEPRESSION Not interested in increasing this dose.  Not interested in therapy. - sertraline (ZOLOFT) 50 MG tablet; Take 1.5 tablets (75 mg total) by mouth daily.  Dispense: 135 tablet; Refill: 5  5. ANXIETY Stable/controlled. - sertraline (ZOLOFT) 50 MG tablet; Take 1.5 tablets (75 mg total) by mouth daily.  Dispense: 135 tablet; Refill: 5  Reassess in 3 months.  Fara Chute, PA-C Physician Assistant-Certified [facility]

## 2014-02-06 NOTE — Patient Instructions (Addendum)
Please sent me your lab results (glucose, BUN/Cr, electrolytes, Liver Enzymes, Hemoglobin A1C, Lipids). Please also get a urine microalbumin with the nurse at your work.  Talk with your dentist about your jaw pain. Consider taking 1/2 of a Flexeril in the early evening.

## 2014-02-14 ENCOUNTER — Encounter: Payer: Self-pay | Admitting: Physician Assistant

## 2014-03-11 ENCOUNTER — Encounter: Payer: Self-pay | Admitting: Physician Assistant

## 2014-03-11 LAB — ALBUMIN, URINE, RANDOM: Microalb, Ur: 0.4

## 2014-03-14 ENCOUNTER — Telehealth: Payer: Self-pay | Admitting: Internal Medicine

## 2014-03-14 DIAGNOSIS — K50119 Crohn's disease of large intestine with unspecified complications: Secondary | ICD-10-CM

## 2014-03-15 ENCOUNTER — Encounter: Payer: Self-pay | Admitting: *Deleted

## 2014-03-15 NOTE — Telephone Encounter (Signed)
Please obtain CT scan of the abd and pelvis ( last one 2007) to r/o progression of Crohn's disease, IV and oral contrast

## 2014-03-15 NOTE — Telephone Encounter (Signed)
Patient calling to report increase in bloating. States when she eats, she feels like she cannot breathe because of the bloating. Denies constipation, diarrhea or bleeding. States she always has some bloating but this is worse. Please, advise.

## 2014-03-15 NOTE — Telephone Encounter (Signed)
Scheduled CT at [facility] radiology on 03/19/14 at 2:30 PM. NPO 4 hour and contrast 2 and 1 hour prior. Patient aware. Instruction up front for pick up.

## 2014-03-19 ENCOUNTER — Ambulatory Visit (HOSPITAL_COMMUNITY)
Admission: RE | Admit: 2014-03-19 | Discharge: 2014-03-19 | Disposition: A | Discharge status: Home / Self Care | Payer: BC Managed Care – PPO | Source: Ambulatory Visit | Attending: Internal Medicine | Admitting: Internal Medicine

## 2014-03-19 ENCOUNTER — Encounter (HOSPITAL_COMMUNITY): Payer: Self-pay

## 2014-03-19 DIAGNOSIS — R197 Diarrhea, unspecified: Secondary | ICD-10-CM | POA: Diagnosis not present

## 2014-03-19 DIAGNOSIS — K50119 Crohn's disease of large intestine with unspecified complications: Secondary | ICD-10-CM | POA: Insufficient documentation

## 2014-03-19 DIAGNOSIS — R110 Nausea: Secondary | ICD-10-CM | POA: Insufficient documentation

## 2014-03-19 MED ORDER — IOHEXOL 300 MG/ML  SOLN
100.0000 mL | Freq: Once | INTRAMUSCULAR | Status: AC | PRN
  Administered 2014-03-19: 100 mL via INTRAVENOUS

## 2014-03-20 ENCOUNTER — Other Ambulatory Visit: Payer: Self-pay

## 2014-03-20 MED ORDER — ENALAPRIL MALEATE 5 MG PO TABS: 5.0000 mg | ORAL_TABLET | Freq: Every day | ORAL | Status: DC

## 2014-03-21 ENCOUNTER — Other Ambulatory Visit: Payer: Self-pay | Admitting: *Deleted

## 2014-03-21 MED ORDER — METRONIDAZOLE 250 MG PO TABS: ORAL_TABLET | ORAL | Status: DC

## 2014-03-31 ENCOUNTER — Other Ambulatory Visit: Payer: Self-pay

## 2014-03-31 MED ORDER — ATORVASTATIN CALCIUM 20 MG PO TABS: 20.0000 mg | ORAL_TABLET | Freq: Every day | ORAL | Status: DC

## 2014-04-01 ENCOUNTER — Telehealth: Payer: Self-pay | Admitting: Internal Medicine

## 2014-04-01 NOTE — Telephone Encounter (Signed)
Patient states she is not any better after taking Flagyl. States she is now having diarrhea. She has had 5 diarrhea stools today. Continues to have bloating and lower abdominal pain. The pain is in the middle of her abdomen. Denies bleeding. Please, advise.

## 2014-04-01 NOTE — Telephone Encounter (Signed)
Please start Entecort 42m, #90 3 po qd x 4 weeks, 2 po qd x 4 weeks, 3 mg po qd x 4 weeks. If Entecort is too expensive, start Prednisone 117m #100, 4 po qd x 4 days, 3 po qd x 2 weeks, 21/2 po qd x 2 weeks, 2 po qd x 2 weeks, 11/2 po qd x 2 weeks,  1 po qd x 2 weeks, 1/2 po qd x 2 weeks.

## 2014-04-02 MED ORDER — BUDESONIDE 3 MG PO CP24: ORAL_CAPSULE | ORAL | Status: DC

## 2014-04-02 NOTE — Telephone Encounter (Signed)
Rx sent to pharmacy. Patient notified.

## 2014-04-04 ENCOUNTER — Encounter: Payer: Self-pay | Admitting: Internal Medicine

## 2014-04-23 ENCOUNTER — Other Ambulatory Visit: Payer: Self-pay | Admitting: Cardiology

## 2014-04-23 ENCOUNTER — Other Ambulatory Visit: Payer: Self-pay | Admitting: *Deleted

## 2014-04-23 MED ORDER — MERCAPTOPURINE 50 MG PO TABS: 50.0000 mg | ORAL_TABLET | Freq: Every day | ORAL | Status: DC

## 2014-05-06 ENCOUNTER — Telehealth: Payer: Self-pay | Admitting: Internal Medicine

## 2014-05-06 MED ORDER — ESOMEPRAZOLE MAGNESIUM 40 MG PO CPDR: 40.0000 mg | DELAYED_RELEASE_CAPSULE | Freq: Every day | ORAL | Status: DC

## 2014-05-06 NOTE — Telephone Encounter (Signed)
Rx sent. Pt scheduled for routine follow up with Dr Olevia Perches on 08/01/13 @ 1:45 pm.

## 2014-05-17 ENCOUNTER — Ambulatory Visit (INDEPENDENT_AMBULATORY_CARE_PROVIDER_SITE_OTHER): Payer: BC Managed Care – PPO | Admitting: Family Medicine

## 2014-05-17 ENCOUNTER — Other Ambulatory Visit: Payer: Self-pay | Admitting: Cardiology

## 2014-05-17 ENCOUNTER — Ambulatory Visit (INDEPENDENT_AMBULATORY_CARE_PROVIDER_SITE_OTHER): Payer: BC Managed Care – PPO

## 2014-05-17 ENCOUNTER — Other Ambulatory Visit: Payer: Self-pay | Admitting: Physician Assistant

## 2014-05-17 VITALS — BP 132/90 | HR 65 | Temp 98.3°F | Resp 18 | Ht 63.0 in | Wt 139.0 lb

## 2014-05-17 DIAGNOSIS — R0789 Other chest pain: Secondary | ICD-10-CM

## 2014-05-17 LAB — POCT CBC
Granulocyte percent: 65.2 %G (ref 37–80)
HCT, POC: 40.8 % (ref 37.7–47.9)
Hemoglobin: 13.1 g/dL (ref 12.2–16.2)
Lymph, poc: 2.3 (ref 0.6–3.4)
MCH, POC: 31 pg (ref 27–31.2)
MCHC: 32.1 g/dL (ref 31.8–35.4)
MCV: 96.5 fL (ref 80–97)
MID (cbc): 0.4 (ref 0–0.9)
MPV: 6.8 fL (ref 0–99.8)
POC Granulocyte: 5.1 (ref 2–6.9)
POC LYMPH PERCENT: 29.1 %L (ref 10–50)
POC MID %: 5.7 %M (ref 0–12)
Platelet Count, POC: 294 10*3/uL (ref 142–424)
RBC: 4.23 M/uL (ref 4.04–5.48)
RDW, POC: 14.6 %
WBC: 7.8 10*3/uL (ref 4.6–10.2)

## 2014-05-17 MED ORDER — GI COCKTAIL ~~LOC~~
30.0000 mL | Freq: Once | ORAL | Status: AC
  Administered 2014-05-17: 30 mL via ORAL

## 2014-05-17 NOTE — Progress Notes (Addendum)
° °  Subjective:    Patient ID: [name], female    DOB: 06-12-1960, 53 y.o.   MRN: [id number] This chart was scribed for Robyn Haber, MD by Zola Button, Medical Scribe. This patient was seen in Room 7 and the patient's care was started at 5:56 PM.   HPI HPI Comments: NGOZI [name] is a 53 y.o. female with a hx of Crohn's disease dx in 1992 who presents to the [facility] complaining of intermittent, dull chest pain radiating to her back, shoulder blades and neck that began a few weeks ago. She has had similar symptoms in the past. Patient also reports having frequent nausea (at least once a day), fatigue, excessive diaphoresis, HA, postnasal drip and occasional SOB. She is on budesonide for her Crohn's disease. Patient denies vomiting and blood in stool. She notes a FMHx of cardiovascular problems.  Patient works at [employer].  Review of Systems  Constitutional: Positive for diaphoresis and fatigue.  HENT: Positive for postnasal drip.   Respiratory: Positive for shortness of breath.   Cardiovascular: Positive for chest pain.  Gastrointestinal: Positive for nausea. Negative for vomiting and blood in stool.  Neurological: Positive for headaches.       Objective:   Physical Exam CONSTITUTIONAL: Well developed/well nourished HEAD: Normocephalic/atraumatic EYES: EOM/PERRL ENMT: Mucous membranes moist NECK: supple no meningeal signs SPINE: entire spine nontender CV: S1/S2 noted, no murmurs/rubs/gallops noted LUNGS: Lungs are clear to auscultation bilaterally, no apparent distress ABDOMEN: soft, nontender, no rebound or guarding GU: no cva tenderness NEURO: Pt is awake/alert, moves all extremitiesx4 EXTREMITIES: pulses normal, full ROM SKIN: warm, color normal PSYCH: no abnormalities of mood noted  EKG NSR [facility] reading (PRIMARY) by  Dr. Joseph Art:  normal. Results for orders placed or performed in visit on 05/17/14  POCT CBC  Result Value Ref  Range   WBC 7.8 4.6 - 10.2 K/uL   Lymph, poc 2.3 0.6 - 3.4   POC LYMPH PERCENT 29.1 10 - 50 %L   MID (cbc) 0.4 0 - 0.9   POC MID % 5.7 0 - 12 %M   POC Granulocyte 5.1 2 - 6.9   Granulocyte percent 65.2 37 - 80 %G   RBC 4.23 4.04 - 5.48 M/uL   Hemoglobin 13.1 12.2 - 16.2 g/dL   HCT, POC 40.8 37.7 - 47.9 %   MCV 96.5 80 - 97 fL   MCH, POC 31.0 27 - 31.2 pg   MCHC 32.1 31.8 - 35.4 g/dL   RDW, POC 14.6 %   Platelet Count, POC 294 142 - 424 K/uL   MPV 6.8 0 - 99.8 fL  given GI cocktail:  Some improvement    Assessment & Plan:     This chart was scribed in my presence and reviewed by me personally.    ICD-9-CM ICD-10-CM   1. Other chest pain 786.59 R07.89 EKG 12-Lead     POCT CBC     DG Chest 2 View     Sedimentation rate     gi cocktail (Maalox,Lidocaine,Donnatal)     CANCELED: POCT SEDIMENTATION RATE   Tests negative:  Most consistent with some esophageal spasm  Signed, Robyn Haber, MD

## 2014-05-17 NOTE — Patient Instructions (Signed)
Your test turned out normal: EKG, chest x-ray, blood count  I think the most likely explanation for this is some irritation that is not acid that has resulted in some esophageal spasm. I think this will resolve over time.

## 2014-05-18 LAB — SEDIMENTATION RATE: Sed Rate: 7 mm/hr (ref 0–22)

## 2014-05-20 ENCOUNTER — Telehealth: Payer: Self-pay | Admitting: Internal Medicine

## 2014-05-20 NOTE — Telephone Encounter (Signed)
Patient reports that she is having nausea with entocort.  Her symptoms of bloating and diarrhea have resolved.  She was given a taper of entocort back in November, she has 13 more days of 6 mg then is to go to 3 mg a day.  She would like to know if the taper can be sped up?

## 2014-05-20 NOTE — Telephone Encounter (Signed)
Please go  To Entecort 56m/day right away and stay x 1 week, then stop.

## 2014-05-21 NOTE — Telephone Encounter (Signed)
Patient notified of recommendations.  She will call back if she has a flare

## 2014-06-03 ENCOUNTER — Other Ambulatory Visit: Payer: Self-pay | Admitting: Cardiology

## 2014-06-05 NOTE — Telephone Encounter (Signed)
What is the medication?  She has an appt with Dr Aundra Dubin 06/12/14, if it is a medication he prescribes, you can give her enough refills to last to her appt with Dr Aundra Dubin.

## 2014-06-12 ENCOUNTER — Ambulatory Visit (INDEPENDENT_AMBULATORY_CARE_PROVIDER_SITE_OTHER): Payer: BLUE CROSS/BLUE SHIELD | Admitting: Cardiology

## 2014-06-12 ENCOUNTER — Encounter: Payer: Self-pay | Admitting: Cardiology

## 2014-06-12 VITALS — BP 110/68 | HR 77 | Ht 63.0 in | Wt 139.8 lb

## 2014-06-12 DIAGNOSIS — I2510 Atherosclerotic heart disease of native coronary artery without angina pectoris: Secondary | ICD-10-CM

## 2014-06-12 DIAGNOSIS — E785 Hyperlipidemia, unspecified: Secondary | ICD-10-CM

## 2014-06-12 LAB — LIPID PANEL
CHOL/HDL RATIO: 5
CHOLESTEROL: 164 mg/dL (ref 0–200)
HDL: 30.1 mg/dL — ABNORMAL LOW (ref >39.00)
NonHDL: 133.9
Triglycerides: 238 mg/dL — ABNORMAL HIGH (ref 0.0–149.0)
VLDL: 47.6 mg/dL — ABNORMAL HIGH (ref 0.0–40.0)

## 2014-06-12 LAB — LDL CHOLESTEROL, DIRECT: Direct LDL: 104 mg/dL

## 2014-06-12 NOTE — Patient Instructions (Signed)
Your physician recommends that you have a  lipid profile today.  Your physician wants you to follow-up in: 1 year with Dr Aundra Dubin. (January 2017). You will receive a reminder letter in the mail two months in advance. If you don't receive a letter, please call our office to schedule the follow-up appointment.

## 2014-06-13 NOTE — Progress Notes (Signed)
Patient ID: [name], female   DOB: 11-19-1960, 54 y.o.   MRN: [id number] PCP: Harrison Mons  54 yo with history of Crohns disease, GERD, and hyperlipidemia returns for cardiology followup. ETT-myoview in 3/11 showed no evidence for ischemia or infarction and normal EF.  Recurrent chest pain led to a coronary CT angiogram which showed no obstructive CAD but calcium score put her in a high risk category (87th percentile).  She continues to have occasional palpitations.  Holter monitor in 2011 showed occasional PVCs which I suspect are the cause of her symptoms. Blood pressure is under good control.  Patient reports occasional dyspnea with heavy exertion but in general has good exercise tolerance.  She does a lot of walking at work. She had an episode of chest pain in 12/15 while off Nexium that was thought to be esophageal spasm versus GERD.  She restarted Nexium and has had no further chest pain.  She does have a strong family history of CAD and her brother had CABG (he was 41).   Labs (3/11): K 3.7, creatinine 0.7  Labs (10/12): LDL 118, HDL 29, TGs 188 Labs (4/14): LDL 93, HDL 37, creatinine 0.7 Labs (3/15): LDL 127, HDL 50   Allergies (verified):  1) ! * Remicade   Past Medical History:  1. B12 DEFICIENCY  2. ANXIETY  3. Type II diabetes: diet-controlled.   4. DEPRESSION 5. HYPERLIPIDEMIA  6. GERD  7. CHOLELITHIASIS  8. CROHN'S DISEASE: Followed by Dr. Olevia Perches  9. FIBROMYALGIA   10. ALLERGIC RHINITIS  11. CAD: ETT-myoview (3/11) with EF 67%, hypertensive BP response (234/58), normal perfusion images.  Coronary CT angiogram in 11/12 showed mild nonobstructive plaque in the LAD, calcium score placed her in the 87th percentile.  12. Chronic low back pain  13. Palpitations: Holter (3/11) showed occasional PVCs.  14. HTN   Family History:   Mother died with MI at age 18. Father with CVA and a heart valve replacement.  Brother with CABG at 71.  Nephew with CVA at 38.  Sister with MI at  59.  Family History High cholesterol  Family History Hypertension  No FH of Colon Cancer  Social History:  Former Smoker- age 26 to 26  Alcohol use-no  Illicit Drug Use - no  Patient does not get regular exercise.  Daily Caffeine Use: 2 cups daily  Account rep for [employer]    Current Outpatient Prescriptions  Medication Sig Dispense Refill  . albuterol (PROVENTIL) (5 MG/ML) 0.5% nebulizer solution Take 0.5 mLs (2.5 mg total) by nebulization every 6 (six) hours as needed for wheezing or shortness of breath. 20 mL 0  . aspirin 81 MG chewable tablet Chew 81 mg by mouth daily.      Marland Kitchen atorvastatin (LIPITOR) 20 MG tablet TAKE 1 TABLET BY MOUTH DAILY 30 tablet 6  . celecoxib (CELEBREX) 200 MG capsule TAKE ONE CAPSULE BY MOUTH TWICE A DAY 60 capsule 2  . Co-Enzyme Q10 200 MG CAPS Take by mouth daily.    . cyanocobalamin (,VITAMIN B-12,) 1000 MCG/ML injection Inject 1 mL (1,000 mcg total) into the muscle every 30 (thirty) days. 6 mL 0  . cyclobenzaprine (FLEXERIL) 10 MG tablet Take 0.5-1 tablets (5-10 mg total) by mouth 3 (three) times daily as needed for muscle spasms. 30 tablet 0  . enalapril (VASOTEC) 5 MG tablet TAKE 1 TABLET BY MOUTH ONCE DAILY 14 tablet 6  . esomeprazole (NEXIUM) 40 MG capsule Take 1 capsule (40 mg total)  by mouth daily. 30 capsule 2  . mercaptopurine (PURINETHOL) 50 MG tablet Take 1 tablet (50 mg total) by mouth daily. 30 tablet 2  . sertraline (ZOLOFT) 50 MG tablet Take 1.5 tablets (75 mg total) by mouth daily. 135 tablet 5  . zolpidem (AMBIEN) 10 MG tablet Take 10 mg by mouth at bedtime as needed.       No current facility-administered medications for this visit.    BP 110/68 mmHg  Pulse 77  Ht 5' 3"  (1.6 m)  Wt 139 lb 12.8 oz (63.413 kg)  BMI 24.77 kg/m2 General: NAD Neck: No JVD, no thyromegaly or thyroid nodule.  Lungs: Clear to auscultation bilaterally with normal respiratory effort. CV: Nondisplaced PMI.  Heart regular S1/S2, no S3/S4, no  murmur.  No peripheral edema.  No carotid bruit.  Normal pedal pulses.  Abdomen: Soft, nontender, no hepatosplenomegaly, no distention.   Neurologic: Alert and oriented x 3.  Psych: Normal affect. Extremities: No clubbing or cyanosis.   Assessment/Plan: 1. CAD: Nonobstructive.  Strong family history of CAD.  Continue ASA 81, ACEI, and statin.  2. Hyperlipidemia: I would like to see LDL ideally < 70, at least < 100.  Will check lipids today.  Was too high in 3/15.   3. HTN: BP controlled on enalapril.   Loralie Champagne 06/13/2014

## 2014-06-18 ENCOUNTER — Other Ambulatory Visit: Payer: Self-pay | Admitting: *Deleted

## 2014-06-18 DIAGNOSIS — E785 Hyperlipidemia, unspecified: Secondary | ICD-10-CM

## 2014-06-18 MED ORDER — ATORVASTATIN CALCIUM 40 MG PO TABS: 40.0000 mg | ORAL_TABLET | Freq: Every day | ORAL | Status: DC

## 2014-06-24 ENCOUNTER — Other Ambulatory Visit: Payer: Self-pay | Admitting: *Deleted

## 2014-06-24 MED ORDER — ENALAPRIL MALEATE 5 MG PO TABS: 5.0000 mg | ORAL_TABLET | Freq: Every day | ORAL | Status: DC

## 2014-07-22 ENCOUNTER — Telehealth: Payer: Self-pay | Admitting: Internal Medicine

## 2014-07-22 MED ORDER — MERCAPTOPURINE 50 MG PO TABS: 50.0000 mg | ORAL_TABLET | Freq: Every day | ORAL | Status: DC

## 2014-07-22 NOTE — Telephone Encounter (Signed)
Sent Rx for Mercaptopurine (PURINETHOL), 50 mg; #30, No refills. Take one tablet, by mouth, every day to [facility]. Patient has an appointment with Dr. Olevia Perches on 08/02/14 at 1:45 pm.

## 2014-08-02 ENCOUNTER — Emergency Department (HOSPITAL_COMMUNITY)
Admission: EM | Admit: 2014-08-02 | Discharge: 2014-08-02 | Disposition: A | Discharge status: Home / Self Care | Payer: BLUE CROSS/BLUE SHIELD | Attending: Emergency Medicine | Admitting: Emergency Medicine

## 2014-08-02 ENCOUNTER — Ambulatory Visit: Payer: BC Managed Care – PPO | Admitting: Internal Medicine

## 2014-08-02 ENCOUNTER — Ambulatory Visit (INDEPENDENT_AMBULATORY_CARE_PROVIDER_SITE_OTHER): Payer: BLUE CROSS/BLUE SHIELD | Admitting: Internal Medicine

## 2014-08-02 ENCOUNTER — Encounter (HOSPITAL_COMMUNITY): Payer: Self-pay

## 2014-08-02 ENCOUNTER — Other Ambulatory Visit: Payer: Self-pay | Admitting: Internal Medicine

## 2014-08-02 ENCOUNTER — Encounter: Payer: Self-pay | Admitting: Internal Medicine

## 2014-08-02 VITALS — BP 134/70 | HR 72 | Ht 62.5 in | Wt 137.2 lb

## 2014-08-02 DIAGNOSIS — E860 Dehydration: Secondary | ICD-10-CM | POA: Diagnosis not present

## 2014-08-02 DIAGNOSIS — M791 Myalgia, unspecified site: Secondary | ICD-10-CM

## 2014-08-02 DIAGNOSIS — R7989 Other specified abnormal findings of blood chemistry: Secondary | ICD-10-CM

## 2014-08-02 DIAGNOSIS — R945 Abnormal results of liver function studies: Secondary | ICD-10-CM

## 2014-08-02 DIAGNOSIS — F329 Major depressive disorder, single episode, unspecified: Secondary | ICD-10-CM | POA: Insufficient documentation

## 2014-08-02 DIAGNOSIS — K219 Gastro-esophageal reflux disease without esophagitis: Secondary | ICD-10-CM | POA: Diagnosis not present

## 2014-08-02 DIAGNOSIS — R112 Nausea with vomiting, unspecified: Secondary | ICD-10-CM | POA: Diagnosis present

## 2014-08-02 DIAGNOSIS — E539 Vitamin B deficiency, unspecified: Secondary | ICD-10-CM | POA: Insufficient documentation

## 2014-08-02 DIAGNOSIS — Z7982 Long term (current) use of aspirin: Secondary | ICD-10-CM | POA: Insufficient documentation

## 2014-08-02 DIAGNOSIS — Z87891 Personal history of nicotine dependence: Secondary | ICD-10-CM | POA: Diagnosis not present

## 2014-08-02 DIAGNOSIS — K830 Cholangitis: Secondary | ICD-10-CM

## 2014-08-02 DIAGNOSIS — F411 Generalized anxiety disorder: Secondary | ICD-10-CM | POA: Diagnosis not present

## 2014-08-02 DIAGNOSIS — E119 Type 2 diabetes mellitus without complications: Secondary | ICD-10-CM | POA: Diagnosis not present

## 2014-08-02 DIAGNOSIS — Z79899 Other long term (current) drug therapy: Secondary | ICD-10-CM | POA: Insufficient documentation

## 2014-08-02 DIAGNOSIS — I10 Essential (primary) hypertension: Secondary | ICD-10-CM | POA: Diagnosis not present

## 2014-08-02 DIAGNOSIS — Z9889 Other specified postprocedural states: Secondary | ICD-10-CM | POA: Insufficient documentation

## 2014-08-02 DIAGNOSIS — R197 Diarrhea, unspecified: Secondary | ICD-10-CM

## 2014-08-02 DIAGNOSIS — R748 Abnormal levels of other serum enzymes: Secondary | ICD-10-CM

## 2014-08-02 DIAGNOSIS — K8309 Other cholangitis: Secondary | ICD-10-CM

## 2014-08-02 DIAGNOSIS — R109 Unspecified abdominal pain: Secondary | ICD-10-CM

## 2014-08-02 LAB — COMPREHENSIVE METABOLIC PANEL
ALBUMIN: 3.7 g/dL (ref 3.5–5.2)
ALT: 213 U/L — ABNORMAL HIGH (ref 0–35)
ANION GAP: 6 (ref 5–15)
AST: 146 U/L — ABNORMAL HIGH (ref 0–37)
Alkaline Phosphatase: 211 U/L — ABNORMAL HIGH (ref 39–117)
BUN: 13 mg/dL (ref 6–23)
CALCIUM: 8.5 mg/dL (ref 8.4–10.5)
CO2: 24 mmol/L (ref 19–32)
Chloride: 106 mmol/L (ref 96–112)
Creatinine, Ser: 0.51 mg/dL (ref 0.50–1.10)
GFR calc non Af Amer: >90 mL/min (ref >90)
Glucose, Bld: 183 mg/dL — ABNORMAL HIGH (ref 70–99)
POTASSIUM: 3.5 mmol/L (ref 3.5–5.1)
Sodium: 136 mmol/L (ref 135–145)
TOTAL PROTEIN: 6.8 g/dL (ref 6.0–8.3)
Total Bilirubin: 2.1 mg/dL — ABNORMAL HIGH (ref 0.3–1.2)

## 2014-08-02 LAB — CBC WITH DIFFERENTIAL/PLATELET
Basophils Absolute: 0 10*3/uL (ref 0.0–0.1)
Basophils Relative: 0 % (ref 0–1)
Eosinophils Absolute: 0.1 10*3/uL (ref 0.0–0.7)
Eosinophils Relative: 2 % (ref 0–5)
HCT: 40.7 % (ref 36.0–46.0)
Hemoglobin: 13.6 g/dL (ref 12.0–15.0)
LYMPHS ABS: 0.3 10*3/uL — AB (ref 0.7–4.0)
Lymphocytes Relative: 4 % — ABNORMAL LOW (ref 12–46)
MCH: 31.3 pg (ref 26.0–34.0)
MCHC: 33.4 g/dL (ref 30.0–36.0)
MCV: 93.8 fL (ref 78.0–100.0)
Monocytes Absolute: 0.3 10*3/uL (ref 0.1–1.0)
Monocytes Relative: 5 % (ref 3–12)
NEUTROS ABS: 5.6 10*3/uL (ref 1.7–7.7)
NEUTROS PCT: 89 % — AB (ref 43–77)
PLATELETS: 194 10*3/uL (ref 150–400)
RBC: 4.34 MIL/uL (ref 3.87–5.11)
RDW: 13.1 % (ref 11.5–15.5)
WBC: 6.3 10*3/uL (ref 4.0–10.5)

## 2014-08-02 LAB — URINALYSIS, ROUTINE W REFLEX MICROSCOPIC
Glucose, UA: NEGATIVE mg/dL
KETONES UR: NEGATIVE mg/dL
NITRITE: NEGATIVE
PROTEIN: NEGATIVE mg/dL
Specific Gravity, Urine: 1.018 (ref 1.005–1.030)
UROBILINOGEN UA: 1 mg/dL (ref 0.0–1.0)
pH: 8 (ref 5.0–8.0)

## 2014-08-02 LAB — URINE MICROSCOPIC-ADD ON

## 2014-08-02 LAB — HEPATITIS PANEL, ACUTE
HCV AB: NEGATIVE
HEP B C IGM: NONREACTIVE
HEP B S AG: NEGATIVE
Hep A IgM: NONREACTIVE

## 2014-08-02 LAB — TROPONIN I

## 2014-08-02 MED ORDER — DIPHENHYDRAMINE HCL 50 MG/ML IJ SOLN
25.0000 mg | Freq: Once | INTRAMUSCULAR | Status: AC
  Administered 2014-08-02: 25 mg via INTRAVENOUS
  Filled 2014-08-02: qty 1

## 2014-08-02 MED ORDER — SODIUM CHLORIDE 0.9 % IV SOLN
1000.0000 mL | Freq: Once | INTRAVENOUS | Status: AC
  Administered 2014-08-02: 1000 mL via INTRAVENOUS

## 2014-08-02 MED ORDER — CIPROFLOXACIN HCL 500 MG PO TABS: 500.0000 mg | ORAL_TABLET | Freq: Two times a day (BID) | ORAL | Status: DC

## 2014-08-02 MED ORDER — SODIUM CHLORIDE 0.9 % IV SOLN: 1000.0000 mL | INTRAVENOUS | Status: DC

## 2014-08-02 MED ORDER — PROMETHAZINE HCL 25 MG RE SUPP: 25.0000 mg | Freq: Four times a day (QID) | RECTAL | Status: DC | PRN

## 2014-08-02 MED ORDER — HYDROCODONE-ACETAMINOPHEN 5-325 MG PO TABS: ORAL_TABLET | ORAL | Status: DC

## 2014-08-02 MED ORDER — PROMETHAZINE HCL 25 MG PO TABS: ORAL_TABLET | ORAL | Status: DC

## 2014-08-02 MED ORDER — METOCLOPRAMIDE HCL 5 MG/ML IJ SOLN
10.0000 mg | Freq: Once | INTRAMUSCULAR | Status: AC
  Administered 2014-08-02: 10 mg via INTRAVENOUS
  Filled 2014-08-02: qty 2

## 2014-08-02 MED ORDER — KETOROLAC TROMETHAMINE 30 MG/ML IJ SOLN
30.0000 mg | Freq: Once | INTRAMUSCULAR | Status: AC
  Administered 2014-08-02: 30 mg via INTRAVENOUS
  Filled 2014-08-02: qty 1

## 2014-08-02 NOTE — Patient Instructions (Addendum)
You have been scheduled for a HIDA scan at [facility]iology (1st floor) on 08-16-14. Please arrive 15 minutes prior to your scheduled appointment at  1:68HF. Make certain not to have anything to eat or drink at least 6 hours prior to your test. Should this appointment date or time not work well for you, please call radiology scheduling at [phone].  _____________________________________________________________________ hepatobiliary (HIDA) scan is an imaging procedure used to diagnose problems in the liver, gallbladder and bile ducts. In the HIDA scan, a radioactive chemical or tracer is injected into a vein in your arm. The tracer is handled by the liver like bile. Bile is a fluid produced and excreted by your liver that helps your digestive system break down fats in the foods you eat. Bile is stored in your gallbladder and the gallbladder releases the bile when you eat a meal. A special nuclear medicine scanner (gamma camera) tracks the flow of the tracer from your liver into your gallbladder and small intestine.  During your HIDA scan  You'll be asked to change into a hospital gown before your HIDA scan begins. Your health care team will position you on a table, usually on your back. The radioactive tracer is then injected into a vein in your arm.The tracer travels through your bloodstream to your liver, where it's taken up by the bile-producing cells. The radioactive tracer travels with the bile from your liver into your gallbladder and through your bile ducts to your small intestine.You may feel some pressure while the radioactive tracer is injected into your vein. As you lie on the table, a special gamma camera is positioned over your abdomen taking pictures of the tracer as it moves through your body. The gamma camera takes pictures continually for about an hour. You'll need to keep still during the HIDA scan. This can become uncomfortable, but you may find that you can lessen the discomfort by  taking deep breaths and thinking about other things. Tell your health care team if you're uncomfortable. The radiologist will watch on a computer the progress of the radioactive tracer through your body. The HIDA scan may be stopped when the radioactive tracer is seen in the gallbladder and enters your small intestine. This typically takes about an hour. In some cases extra imaging will be performed if original images aren't satisfactory, if morphine is given to help visualize the gallbladder or if the medication CCK is given to look at the contraction of the gallbladder. This test typically takes 2 hours to complete. ________________________________________________________________________  We have sent the following medications to your pharmacy for you to pick up at your convenience: Cipro and phenergan  Your physician has requested that you go to the basement for Labs on Monday 08-05-14  Please review hand out on full liquid diet   We have given you a prescription for Norco to take to your pharmacy Debbrah Alar  NP

## 2014-08-02 NOTE — Discharge Instructions (Signed)
You can use the phenergan suppository for nausea or vomiting. Stop your lipitor (atorvastin) until your doctors tell you to restart it. Please let Dr Olevia Perches know about your liver tests when you see her this afternoon. Recheck if you feel worse again.

## 2014-08-02 NOTE — Progress Notes (Signed)
[name] [birth year] [id number]  Note: This dictation was prepared with Dragon digital system. Any transcriptional errors that result from this procedure are unintentional.   History of Present Illness: This is a 54 year old white female with  Crohn's ileitis since 1991, never had surgery. She has mild stenosis of the ileocecal valve as per last colonoscopy in March 2011. She was seen and the emergency room this morning with a 3 day hx of headache, nausea, vomiting and general body aches and fever of 103. She has history of a gallstone which was reported on CT scan of the abdomen in October 2015 which also showed thickening of the terminal ileum. Her labs this morning in emergency room showed AST of 146, ALT 213 alkaline phosphatase of 211 and total bilirubin of 2.1. She was released home on Phenergan supp..  She  has never been hospitalized for Crohn's disease. She has been maintained on 6-MP 50 mg a day and intermittent doses of  prednisone taper and Flagyl for bacterial overgrowth. She has known gastroesophageal reflux and had upper endoscopy in March 2011 which showed Grade A  esophagitis.    Past Medical History  Diagnosis Date  . Vitamin B deficiency   . Anxiety state, unspecified   . Palpitations   . Depressive disorder, not elsewhere classified   . Other and unspecified hyperlipidemia   . Esophageal reflux   . Calculus of gallbladder without mention of cholecystitis or obstruction   . Fibromyalgia   . Crohn disease   . Myalgia and myositis, unspecified   . Allergic rhinitis due to pollen   . Unspecified essential hypertension   . Diabetes mellitus without complication     diet controlled    Past Surgical History  Procedure Laterality Date  . Tonsillectomy and adenoidectomy    . Tubal ligation      BILATERAL  . Exploratory laparotomy    . Esophagogastroduodenoscopy      No Known Allergies  Family history and social history have been reviewed.  Review of Systems:    The remainder of the 10 point ROS is negative except as outlined in the H&P  Physical Exam: General Appearance Well developed, in  distress Eyes  Non icteric  HEENT  Non traumatic, normocephalic  Mouth No lesion, tongue papillated, no cheilosis Neck Supple without adenopathy, thyroid not enlarged, no carotid bruits, no JVD Lungs Clear to auscultation bilaterally COR Normal S1, normal S2, regular rhythm, no murmur, quiet precordium Abdomen quiet bowel sounds. Diffuse tenderness but more so in right upper quadrant with  tenderness on light pressure radiating to the right costal vertebral, right lower quadrant unremarkable.  Rectal not done Extremities  No pedal edema Skin No lesions Neurological Alert and oriented x 3 Psychological Normal mood and affect  Assessment and Plan:   14 year white female with known gallstone now acute elevation of liver function test and abdominal pain localized right upper quadrant consistent with cholecystitis or choledocholithiasis. We will begin Cipro 500 mg twice a day. Full liquid diet. Schedule HIDA scan. Repeat liver function test in 2 days. Since this is Friday if she becomes worse during the weekend  she will have to go to emergency room. Norco 5/325 dispensed 20 for abdominal pain. Repeat LFTs amylase and lipase and sedimentation rate on 08/05/2014. Surgical consult  Not requested yet but will likely be requested later.  Crohn's disease of the terminal ileum with the inflammatory changes in distal ileum. Continue 6-MP. Is due for colonoscopy but this w  will not be scheduled until we evaluate  her gallbladder.May need cholecystectomy before colonoscopy. Consider surgical referral in near future  Debbrah Alar NP   [name] 08/02/2014

## 2014-08-02 NOTE — ED Provider Notes (Signed)
CSN: [id number]     Arrival date & time 08/02/14  9507 History   First MD Initiated Contact with Patient 08/02/14 0530     Chief Complaint  Patient presents with  . Emesis     (Consider location/radiation/quality/duration/timing/severity/associated sxs/prior Treatment) HPI  Patient reports on March 1 she started getting a frontal headache that was sharp without being throbbing. She denies any visual changes. She denies numbness or tingling for fingers. When she got home later that evening she started having nausea and vomiting and diffuse body aches. She denies cough but has postnasal drip without rhinorrhea. She had fever the last 2 days of 102 and 103. She denies shortness of breath. She states she's vomiting about 2 times a day. She had diarrhea the first day but not since. She states today her headache is gone. She started having chest soreness yesterday mainly when she has vomiting and not with coughing. She states she last vomited about 3 AM. She states she came to the ED because of her diffuse body aches. Patient states she has a history of fibromyalgia.  PCP Dr Jacqulynn Cadet GI Dr Olevia Perches has appt today for Crohn's Follow up  Past Medical History  Diagnosis Date  . Vitamin B deficiency   . Anxiety state, unspecified   . Palpitations   . Depressive disorder, not elsewhere classified   . Other and unspecified hyperlipidemia   . Esophageal reflux   . Calculus of gallbladder without mention of cholecystitis or obstruction   . Fibromyalgia   . Crohn disease   . Myalgia and myositis, unspecified   . Allergic rhinitis due to pollen   . Unspecified essential hypertension   . Diabetes mellitus without complication     diet controlled   Past Surgical History  Procedure Laterality Date  . Tonsillectomy and adenoidectomy    . Tubal ligation      BILATERAL  . Exploratory laparotomy    . Esophagogastroduodenoscopy     Family History  Problem Relation Age of Onset  . Lung cancer  Maternal Grandmother   . Stroke Father   . Hypertension Mother   . Heart attack Mother   . Heart disease Mother   . Hypertension Brother   . Heart disease Brother   . Coronary artery disease Brother   . Hypertension Sister   . Heart disease Sister   . Colon cancer Neg Hx    History  Substance Use Topics  . Smoking status: Former Smoker -- 1.00 packs/day for 35 years    Types: Cigarettes    Quit date: 05/31/2004  . Smokeless tobacco: Never Used  . Alcohol Use: No   employed  OB History    No data available     Review of Systems  All other systems reviewed and are negative.     Allergies  Review of patient's allergies indicates no known allergies.  Home Medications   Prior to Admission medications   Medication Sig Start Date End Date Taking? Authorizing Provider  albuterol (PROVENTIL HFA;VENTOLIN HFA) 108 (90 BASE) MCG/ACT inhaler Inhale 2 puffs into the lungs every 6 (six) hours as needed for wheezing or shortness of breath.   Yes Historical Provider, MD  albuterol (PROVENTIL) (5 MG/ML) 0.5% nebulizer solution Take 0.5 mLs (2.5 mg total) by nebulization every 6 (six) hours as needed for wheezing or shortness of breath. 11/23/12  Yes Thao P Le, DO  ALPRAZolam (XANAX) 0.5 MG tablet Take 0.5 mg by mouth at bedtime as needed for anxiety.  Yes Historical Provider, MD  aspirin 81 MG chewable tablet Chew 81 mg by mouth daily.     Yes Historical Provider, MD  atorvastatin (LIPITOR) 40 MG tablet Take 1 tablet (40 mg total) by mouth daily. 06/18/14  Yes Larey Dresser, MD  cyanocobalamin (,VITAMIN B-12,) 1000 MCG/ML injection Inject 1 mL (1,000 mcg total) into the muscle every 30 (thirty) days. 11/17/10  Yes Lafayette Dragon, MD  cyclobenzaprine (FLEXERIL) 10 MG tablet Take 0.5-1 tablets (5-10 mg total) by mouth 3 (three) times daily as needed for muscle spasms. 01/11/13  Yes Chelle S Jeffery, PA-C  enalapril (VASOTEC) 5 MG tablet Take 1 tablet (5 mg total) by mouth daily. 06/24/14  Yes  Larey Dresser, MD  esomeprazole (NEXIUM) 40 MG capsule Take 1 capsule (40 mg total) by mouth daily. 05/06/14  Yes Lafayette Dragon, MD  fluconazole (DIFLUCAN) 150 MG tablet Take 150 mg by mouth once. Repeat in a week.   Yes Historical Provider, MD  mercaptopurine (PURINETHOL) 50 MG tablet Take 1 tablet (50 mg total) by mouth daily. 07/22/14  Yes Lafayette Dragon, MD  nitrofurantoin, macrocrystal-monohydrate, (MACROBID) 100 MG capsule Take 100 mg by mouth 2 (two) times daily.   Yes Historical Provider, MD  sertraline (ZOLOFT) 50 MG tablet Take 1.5 tablets (75 mg total) by mouth daily. 02/06/14  Yes Chelle S Jeffery, PA-C  zolpidem (AMBIEN) 10 MG tablet Take 10 mg by mouth at bedtime as needed.     Yes Historical Provider, MD  celecoxib (CELEBREX) 200 MG capsule TAKE ONE CAPSULE BY MOUTH TWICE A DAY Patient not taking: Reported on 08/02/2014 05/20/14   Chelle S Jeffery, PA-C   BP 118/70 mmHg  Pulse 99  Temp(Src) 98.5 F (36.9 C) (Oral)  Resp 18  Vital signs normal   Physical Exam  Constitutional: She is oriented to person, place, and time. She appears well-developed and well-nourished.  Non-toxic appearance. She does not appear ill. No distress.  HENT:  Head: Normocephalic and atraumatic.  Right Ear: External ear normal.  Left Ear: External ear normal.  Nose: Nose normal. No mucosal edema or rhinorrhea.  Mouth/Throat: Mucous membranes are normal. No dental abscesses or uvula swelling.  Tongue dry  Eyes: Conjunctivae and EOM are normal. Pupils are equal, round, and reactive to light.  Neck: Normal range of motion and full passive range of motion without pain. Neck supple.  Cardiovascular: Normal rate, regular rhythm and normal heart sounds.  Exam reveals no gallop and no friction rub.   No murmur heard. Pulmonary/Chest: Effort normal and breath sounds normal. No respiratory distress. She has no wheezes. She has no rhonchi. She has no rales. She exhibits no tenderness and no crepitus.  Abdominal:  Soft. Normal appearance. She exhibits no distension. There is no tenderness. There is no rebound and no guarding.  Bowel sounds diminished  Musculoskeletal: Normal range of motion. She exhibits no edema or tenderness.  Moves all extremities well.   Neurological: She is alert and oriented to person, place, and time. She has normal strength. No cranial nerve deficit.  Skin: Skin is warm, dry and intact. No rash noted. No erythema. No pallor.  Psychiatric: She has a normal mood and affect. Her speech is normal and behavior is normal. Her mood appears not anxious.  Nursing note and vitals reviewed.   ED Course  Procedures (including critical care time)  Medications  0.9 %  sodium chloride infusion (1,000 mLs Intravenous New Bag/Given 08/02/14 0629)    Followed  by  0.9 %  sodium chloride infusion (1,000 mLs Intravenous New Bag/Given 08/02/14 0629)    Followed by  0.9 %  sodium chloride infusion (not administered)  ketorolac (TORADOL) 30 MG/ML injection 30 mg (30 mg Intravenous Given 08/02/14 0630)  metoCLOPramide (REGLAN) injection 10 mg (10 mg Intravenous Given 08/02/14 0630)  diphenhydrAMINE (BENADRYL) injection 25 mg (25 mg Intravenous Given 08/02/14 0630)    7:20 AM patient and related to the bathroom without difficulty or assistance. She states she's feeling much improved. Her body aches are gone. Her nausea is gone. She's had no more diarrhea. Patient has not been around anybody with known hepatitis and she's not around any small children. Has no abdominal pain. We discussed stopping her cholesterol medication, Lipitor and to discuss this with her GI doctor this afternoon, Dr. Olevia Perches.  Patient was discharged home with Phenergan instead of Zofran due to her borderline prolonged QT interval.   Labs Review Results for orders placed or performed during the hospital encounter of 08/02/14  Comprehensive metabolic panel  Result Value Ref Range   Sodium 136 135 - 145 mmol/L   Potassium 3.5 3.5 -  5.1 mmol/L   Chloride 106 96 - 112 mmol/L   CO2 24 19 - 32 mmol/L   Glucose, Bld 183 (H) 70 - 99 mg/dL   BUN 13 6 - 23 mg/dL   Creatinine, Ser 0.51 0.50 - 1.10 mg/dL   Calcium 8.5 8.4 - 10.5 mg/dL   Total Protein 6.8 6.0 - 8.3 g/dL   Albumin 3.7 3.5 - 5.2 g/dL   AST 146 (H) 0 - 37 U/L   ALT 213 (H) 0 - 35 U/L   Alkaline Phosphatase 211 (H) 39 - 117 U/L   Total Bilirubin 2.1 (H) 0.3 - 1.2 mg/dL   GFR calc non Af Amer >90 >90 mL/min   GFR calc Af Amer >90 >90 mL/min   Anion gap 6 5 - 15  CBC with Differential  Result Value Ref Range   WBC 6.3 4.0 - 10.5 K/uL   RBC 4.34 3.87 - 5.11 MIL/uL   Hemoglobin 13.6 12.0 - 15.0 g/dL   HCT 40.7 36.0 - 46.0 %   MCV 93.8 78.0 - 100.0 fL   MCH 31.3 26.0 - 34.0 pg   MCHC 33.4 30.0 - 36.0 g/dL   RDW 13.1 11.5 - 15.5 %   Platelets 194 150 - 400 K/uL   Neutrophils Relative % 89 (H) 43 - 77 %   Neutro Abs 5.6 1.7 - 7.7 K/uL   Lymphocytes Relative 4 (L) 12 - 46 %   Lymphs Abs 0.3 (L) 0.7 - 4.0 K/uL   Monocytes Relative 5 3 - 12 %   Monocytes Absolute 0.3 0.1 - 1.0 K/uL   Eosinophils Relative 2 0 - 5 %   Eosinophils Absolute 0.1 0.0 - 0.7 K/uL   Basophils Relative 0 0 - 1 %   Basophils Absolute 0.0 0.0 - 0.1 K/uL  Urinalysis, Routine w reflex microscopic  Result Value Ref Range   Color, Urine AMBER (A) YELLOW   APPearance CLEAR CLEAR   Specific Gravity, Urine 1.018 1.005 - 1.030   pH 8.0 5.0 - 8.0   Glucose, UA NEGATIVE NEGATIVE mg/dL   Hgb urine dipstick SMALL (A) NEGATIVE   Bilirubin Urine SMALL (A) NEGATIVE   Ketones, ur NEGATIVE NEGATIVE mg/dL   Protein, ur NEGATIVE NEGATIVE mg/dL   Urobilinogen, UA 1.0 0.0 - 1.0 mg/dL   Nitrite NEGATIVE NEGATIVE   Leukocytes,  UA SMALL (A) NEGATIVE  Troponin I  Result Value Ref Range   Troponin I <0.03 <0.031 ng/mL  Urine microscopic-add on  Result Value Ref Range   WBC, UA 3-6 <3 WBC/hpf   RBC / HPF 3-6 <3 RBC/hpf   Bacteria, UA RARE RARE   Urine-Other MUCOUS PRESENT    Laboratory  interpretation all normal except elevation of LFTs which is new from a year ago, patient is noted to be on a cholesterol medication     Imaging Review No results found.   EKG Interpretation   Date/Time:  Friday August 02 2014 06:38:47 EST Ventricular Rate:  84 PR Interval:  144 QRS Duration: 101 QT Interval:  413 QTC Calculation: 488 R Axis:   94 Text Interpretation:  Sinus rhythm Atrial premature complexes Borderline  right axis deviation Borderline T wave abnormalities Borderline prolonged  QT interval No old tracing to compare Confirmed by Willia Genrich  MD-I, Shifa Brisbon  ([id number]) on 08/02/2014 6:51:05 AM      MDM   Final diagnoses:  Myalgia  Nausea vomiting and diarrhea  Dehydration  Elevated liver enzymes     New Prescriptions   PROMETHAZINE (PHENERGAN) 25 MG SUPPOSITORY    Place 1 suppository (25 mg total) rectally every 6 (six) hours as needed for nausea or vomiting.    Plan discharge  Rolland Porter, MD, Alanson Aly, MD 08/02/14 [phone]

## 2014-08-02 NOTE — ED Notes (Signed)
Patient reports she began vomiting, diarrhea, chills, intermittent LUQ pain and body aches 3 days ago.  She says she started a new medication that day as well (Nitrofurantoin).  Skin is hot and dry.

## 2014-08-03 ENCOUNTER — Encounter: Payer: Self-pay | Admitting: Internal Medicine

## 2014-08-05 ENCOUNTER — Telehealth: Payer: Self-pay | Admitting: Internal Medicine

## 2014-08-05 ENCOUNTER — Other Ambulatory Visit: Payer: Self-pay | Admitting: *Deleted

## 2014-08-05 ENCOUNTER — Other Ambulatory Visit (INDEPENDENT_AMBULATORY_CARE_PROVIDER_SITE_OTHER): Payer: BLUE CROSS/BLUE SHIELD

## 2014-08-05 DIAGNOSIS — R109 Unspecified abdominal pain: Secondary | ICD-10-CM

## 2014-08-05 DIAGNOSIS — R7989 Other specified abnormal findings of blood chemistry: Secondary | ICD-10-CM

## 2014-08-05 DIAGNOSIS — K8309 Other cholangitis: Secondary | ICD-10-CM

## 2014-08-05 DIAGNOSIS — R945 Abnormal results of liver function studies: Secondary | ICD-10-CM

## 2014-08-05 DIAGNOSIS — K830 Cholangitis: Secondary | ICD-10-CM

## 2014-08-05 LAB — SEDIMENTATION RATE: Sed Rate: 50 mm/hr — ABNORMAL HIGH (ref 0–22)

## 2014-08-05 LAB — HEPATIC FUNCTION PANEL
ALBUMIN: 3.8 g/dL (ref 3.5–5.2)
ALK PHOS: 351 U/L — AB (ref 39–117)
ALT: 167 U/L — ABNORMAL HIGH (ref 0–35)
AST: 66 U/L — ABNORMAL HIGH (ref 0–37)
Bilirubin, Direct: 0.4 mg/dL — ABNORMAL HIGH (ref 0.0–0.3)
Total Bilirubin: 1.2 mg/dL (ref 0.2–1.2)
Total Protein: 6.8 g/dL (ref 6.0–8.3)

## 2014-08-05 LAB — LIPASE: LIPASE: 25 U/L (ref 11.0–59.0)

## 2014-08-05 LAB — AMYLASE: Amylase: 36 U/L (ref 27–131)

## 2014-08-05 NOTE — Telephone Encounter (Signed)
Patient notified of recommendation.

## 2014-08-05 NOTE — Telephone Encounter (Signed)
She can start eating but avoid Fat.

## 2014-08-05 NOTE — Telephone Encounter (Signed)
Spoke with patient and gave her the results of labs. She is asking if she can increase her diet from full liquids. Please, advise.

## 2014-08-08 ENCOUNTER — Ambulatory Visit (INDEPENDENT_AMBULATORY_CARE_PROVIDER_SITE_OTHER): Payer: BLUE CROSS/BLUE SHIELD

## 2014-08-08 ENCOUNTER — Ambulatory Visit (INDEPENDENT_AMBULATORY_CARE_PROVIDER_SITE_OTHER): Payer: BLUE CROSS/BLUE SHIELD | Admitting: Family Medicine

## 2014-08-08 VITALS — BP 100/60 | HR 65 | Temp 98.3°F | Resp 16 | Ht 63.0 in | Wt 134.0 lb

## 2014-08-08 DIAGNOSIS — M25552 Pain in left hip: Secondary | ICD-10-CM | POA: Diagnosis not present

## 2014-08-08 NOTE — Patient Instructions (Addendum)
Your pain may be from arthritis in the hip or irritation of cartilage of the hip. It does not appear to be from your back at this time.   celebrex - up to 2 per day for now, then when pain improves back to one per day. I will refer you to orthopaedist.  Hydrocodone - 1/2 to 1 if needed for more severe pain. Return to the clinic or go to the nearest emergency room if any of your symptoms worsen or new symptoms occur.  Hip Pain Your hip is the joint between your upper legs and your lower pelvis. The bones, cartilage, tendons, and muscles of your hip joint perform a lot of work each day supporting your body weight and allowing you to move around. Hip pain can range from a minor ache to severe pain in one or both of your hips. Pain may be felt on the inside of the hip joint near the groin, or the outside near the buttocks and upper thigh. You may have swelling or stiffness as well.  HOME CARE INSTRUCTIONS   Take medicines only as directed by your health care provider.  Apply ice to the injured area:  Put ice in a plastic bag.  Place a towel between your skin and the bag.  Leave the ice on for 15-20 minutes at a time, 3-4 times a day.  Keep your leg raised (elevated) when possible to lessen swelling.  Avoid activities that cause pain.  Follow specific exercises as directed by your health care provider.  Sleep with a pillow between your legs on your most comfortable side.  Record how often you have hip pain, the location of the pain, and what it feels like. SEEK MEDICAL CARE IF:   You are unable to put weight on your leg.  Your hip is red or swollen or very tender to touch.  Your pain or swelling continues or worsens after 1 week.  You have increasing difficulty walking.  You have a fever. SEEK IMMEDIATE MEDICAL CARE IF:   You have fallen.  You have a sudden increase in pain and swelling in your hip. MAKE SURE YOU:   Understand these instructions.  Will watch your  condition.  Will get help right away if you are not doing well or get worse. Document Released: 11/04/2009 Document Revised: 10/01/2013 Document Reviewed: 01/11/2013 [employer] Patient Information 2015 [place], [place]. This information is not intended to replace advice given to you by your health care provider. Make sure you discuss any questions you have with your health care provider.

## 2014-08-08 NOTE — Progress Notes (Signed)
Subjective:    Patient ID: [name], female    DOB: [birth year], 54 y.o.   MRN: [id number]  HPI  [name] is a 54 y.o. female  C/o left hip pain since yesterday. NKI. No falls. Pain inside hip.  Told had hip bursitis last year.  No injections. Pain on and off in L hip , but persistent since yesterday.  Difficult to get comfortable. Pain with ambulation, but sitting is ok. No bowel or bladder incontinence, no saddle anesthesia, no lower extremity weakness. Pain does radiate down front of left thigh at times.   Works at [employer] - walking on pavement all day - some lifting involved.   Recent ER eval last week for abdominal pain, vomiting. Followed up with Dr. Olevia Perches - planning on cholecystectomy - appt next week with general surgeon to discuss this.   Patient Active Problem List   Diagnosis Date Noted  . CAD (coronary artery disease) 10/10/2011  . At risk for coronary artery disease 03/19/2011  . Type II or unspecified type diabetes mellitus without mention of complication, not stated as uncontrolled 02/25/2010  . ESSENTIAL HYPERTENSION, BENIGN 08/18/2009  . CHEST PAIN UNSPECIFIED 08/01/2009  . B12 DEFICIENCY 01/06/2009  . Hyperlipidemia 03/16/2007  . ANXIETY 03/16/2007  . DEPRESSION 03/16/2007  . Seasonal and perennial allergic rhinitis 03/16/2007  . GERD 03/16/2007  . CHOLELITHIASIS W/O CHOLECYSTITIS W/O OBST 03/16/2007  . SACROILIITIS NEC 03/16/2007  . FIBROMYALGIA 03/16/2007  . PALPITATIONS 03/16/2007  . Crohn's disease 03/16/2007   Past Medical History  Diagnosis Date  . Vitamin B deficiency   . Anxiety state, unspecified   . Palpitations   . Depressive disorder, not elsewhere classified   . Other and unspecified hyperlipidemia   . Esophageal reflux   . Calculus of gallbladder without mention of cholecystitis or obstruction   . Fibromyalgia   . Crohn disease   . Myalgia and myositis, unspecified   . Allergic rhinitis due to pollen   . Unspecified  essential hypertension   . Diabetes mellitus without complication     diet controlled   Past Surgical History  Procedure Laterality Date  . Tonsillectomy and adenoidectomy    . Tubal ligation      BILATERAL  . Exploratory laparotomy    . Esophagogastroduodenoscopy     No Known Allergies Prior to Admission medications   Medication Sig Start Date End Date Taking? Authorizing Provider  albuterol (PROVENTIL HFA;VENTOLIN HFA) 108 (90 BASE) MCG/ACT inhaler Inhale 2 puffs into the lungs every 6 (six) hours as needed for wheezing or shortness of breath.   Yes Historical Provider, MD  albuterol (PROVENTIL) (5 MG/ML) 0.5% nebulizer solution Take 0.5 mLs (2.5 mg total) by nebulization every 6 (six) hours as needed for wheezing or shortness of breath. 11/23/12  Yes Thao P Le, DO  ALPRAZolam (XANAX) 0.5 MG tablet Take 0.5 mg by mouth at bedtime as needed for anxiety.   Yes Historical Provider, MD  aspirin 81 MG chewable tablet Chew 81 mg by mouth daily.     Yes Historical Provider, MD  atorvastatin (LIPITOR) 40 MG tablet Take 1 tablet (40 mg total) by mouth daily. 06/18/14  Yes Larey Dresser, MD  celecoxib (CELEBREX) 200 MG capsule TAKE ONE CAPSULE BY MOUTH TWICE A DAY Patient taking differently: TAKE ONE CAPSULE BY MOUTH ONCE A DAY 05/20/14  Yes Chelle S Jeffery, PA-C  cyanocobalamin (,VITAMIN B-12,) 1000 MCG/ML injection Inject 1 mL (1,000 mcg total) into the muscle every  30 (thirty) days. 11/17/10  Yes Lafayette Dragon, MD  enalapril (VASOTEC) 5 MG tablet Take 1 tablet (5 mg total) by mouth daily. 06/24/14  Yes Larey Dresser, MD  esomeprazole (NEXIUM) 40 MG capsule Take 1 capsule (40 mg total) by mouth daily. 05/06/14  Yes Lafayette Dragon, MD  HYDROcodone-acetaminophen [facility]) 5-325 MG per tablet Take 1 tab po every 4-6 hrs as needed for pain 08/02/14  Yes Lafayette Dragon, MD  mercaptopurine (PURINETHOL) 50 MG tablet Take 1 tablet (50 mg total) by mouth daily. 07/22/14  Yes Lafayette Dragon, MD  promethazine  (PHENERGAN) 25 MG tablet Take 1/2 to 1 tab by mouth every 4-6 hrs as needed 08/02/14  Yes Lafayette Dragon, MD  sertraline (ZOLOFT) 50 MG tablet Take 1.5 tablets (75 mg total) by mouth daily. 02/06/14  Yes Chelle S Jeffery, PA-C  cyclobenzaprine (FLEXERIL) 10 MG tablet Take 0.5-1 tablets (5-10 mg total) by mouth 3 (three) times daily as needed for muscle spasms. 01/11/13   Chelle S Jeffery, PA-C  zolpidem (AMBIEN) 10 MG tablet Take 10 mg by mouth at bedtime as needed.      Historical Provider, MD   History   Social History  . Marital Status: Married    Spouse Name: [name]  . Number of Children: 2  . Years of Education: 12+   Occupational History  . 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 ACCOUNT REP. [employer]   Social History Main Topics  . Smoking status: Former Smoker -- 1.00 packs/day for 35 years    Types: Cigarettes    Quit date: 05/31/2004  . Smokeless tobacco: Never Used  . Alcohol Use: No  . Drug Use: No  . Sexual Activity: No     Comment: married   Other Topics Concern  . Not on file   Social History Narrative   Lives with her husband and their pets. Her children are adults and live independently.      Review of Systems  Constitutional: Negative for fever, chills and unexpected weight change.  Gastrointestinal: Negative for abdominal pain and blood in stool.  Genitourinary: Negative for difficulty urinating.  Musculoskeletal: Positive for myalgias, back pain (L lower, with pain in L hip and front of right thigh.  ) and gait problem (limping d/t pain in L hip. ).  Skin: Positive for rash.       Objective:   Physical Exam  Constitutional: She is oriented to person, place, and time. She appears well-developed and well-nourished. No distress.  Pulmonary/Chest: Effort normal.  Abdominal: Soft. There is no tenderness.  Musculoskeletal:       Left hip: She exhibits normal range of motion, normal strength, no tenderness and no bony tenderness  (trach bursa nontender. ).       Lumbar back: She exhibits normal range of motion, no tenderness, no bony tenderness and no spasm.       Back:       Legs: Neurological: She is alert and oriented to person, place, and time. She has normal strength. No sensory deficit.  Reflex Scores:      Patellar reflexes are 2+ on the right side and 2+ on the left side.      Achilles reflexes are 2+ on the right side and 2+ on the left side. Skin: Skin is warm and dry. No rash noted.  Psychiatric: She has a normal mood and affect. Her behavior is normal.  Vitals reviewed.   [facility] reading (PRIMARY)  by  Dr. Carlota Raspberry: L hip: no apparent fracture. Some possible subchondral cystic changes on acetabulum.  No acute findings otherwise.       Assessment & Plan:  [name] is a 54 y.o. female Left hip pain - Plan: DG HIP UNILAT WITH PELVIS 2-3 VIEWS LEFT, Ambulatory referral to Orthopedic Surgery  -intermittent/recurrent hip pain, now persistent past 2 days. Positive FADIR and "C sign", suspected intrarticular hip pathology. Differential diagnosis of labral pathology vs DJD of hip vs. Less likely femoracetabular impingement.   -refer to orthopaedics.   -Has Celebrex at home - temporary increase to 22m QD, has hydrocodone available if needed for more severe symptoms.   No orders of the defined types were placed in this encounter.   Patient Instructions  Your pain may be from arthritis in the hip or irritation of cartilage of the hip. It does not appear to be from your back at this time.   celebrex - up to 2 per day for now, then when pain improves back to one per day. I will refer you to orthopaedist.  Hydrocodone - 1/2 to 1 if needed for more severe pain. Return to the clinic or go to the nearest emergency room if any of your symptoms worsen or new symptoms occur.  Hip Pain Your hip is the joint between your upper legs and your lower pelvis. The bones, cartilage, tendons, and muscles of your hip joint  perform a lot of work each day supporting your body weight and allowing you to move around. Hip pain can range from a minor ache to severe pain in one or both of your hips. Pain may be felt on the inside of the hip joint near the groin, or the outside near the buttocks and upper thigh. You may have swelling or stiffness as well.  HOME CARE INSTRUCTIONS   Take medicines only as directed by your health care provider.  Apply ice to the injured area:  Put ice in a plastic bag.  Place a towel between your skin and the bag.  Leave the ice on for 15-20 minutes at a time, 3-4 times a day.  Keep your leg raised (elevated) when possible to lessen swelling.  Avoid activities that cause pain.  Follow specific exercises as directed by your health care provider.  Sleep with a pillow between your legs on your most comfortable side.  Record how often you have hip pain, the location of the pain, and what it feels like. SEEK MEDICAL CARE IF:   You are unable to put weight on your leg.  Your hip is red or swollen or very tender to touch.  Your pain or swelling continues or worsens after 1 week.  You have increasing difficulty walking.  You have a fever. SEEK IMMEDIATE MEDICAL CARE IF:   You have fallen.  You have a sudden increase in pain and swelling in your hip. MAKE SURE YOU:   Understand these instructions.  Will watch your condition.  Will get help right away if you are not doing well or get worse. Document Released: 11/04/2009 Document Revised: 10/01/2013 Document Reviewed: 01/11/2013 [facility] CenterPatient Information 2015 [place] This information is not intended to replace advice given to you by your health care provider. Make sure you discuss any questions you have with your health care provider.

## 2014-08-09 ENCOUNTER — Telehealth: Payer: Self-pay

## 2014-08-09 ENCOUNTER — Telehealth: Payer: Self-pay | Admitting: *Deleted

## 2014-08-09 MED ORDER — CELECOXIB 200 MG PO CAPS: 200.0000 mg | ORAL_CAPSULE | Freq: Two times a day (BID) | ORAL | Status: DC

## 2014-08-09 MED ORDER — ESOMEPRAZOLE MAGNESIUM 40 MG PO CPDR: 40.0000 mg | DELAYED_RELEASE_CAPSULE | Freq: Every day | ORAL | Status: DC

## 2014-08-09 NOTE — Telephone Encounter (Signed)
Pt requesting refill on celecoxib (CELEBREX) 200 MG capsule [[id number]]

## 2014-08-09 NOTE — Telephone Encounter (Signed)
Sent Rx for Nexium, 40 mg, #30 with two refills to [place] on 08/09/14.

## 2014-08-09 NOTE — Telephone Encounter (Signed)
Left message letting pt know Rx was sent in.

## 2014-08-15 ENCOUNTER — Other Ambulatory Visit (INDEPENDENT_AMBULATORY_CARE_PROVIDER_SITE_OTHER): Payer: BLUE CROSS/BLUE SHIELD | Admitting: *Deleted

## 2014-08-15 DIAGNOSIS — E785 Hyperlipidemia, unspecified: Secondary | ICD-10-CM

## 2014-08-15 LAB — HEPATIC FUNCTION PANEL
ALT: 30 U/L (ref 0–35)
AST: 18 U/L (ref 0–37)
Albumin: 4 g/dL (ref 3.5–5.2)
Alkaline Phosphatase: 139 U/L — ABNORMAL HIGH (ref 39–117)
BILIRUBIN TOTAL: 0.3 mg/dL (ref 0.2–1.2)
Bilirubin, Direct: 0.1 mg/dL (ref 0.0–0.3)
Total Protein: 6.3 g/dL (ref 6.0–8.3)

## 2014-08-15 LAB — LDL CHOLESTEROL, DIRECT: LDL DIRECT: 87 mg/dL

## 2014-08-15 LAB — LIPID PANEL
CHOLESTEROL: 140 mg/dL (ref 0–200)
HDL: 30.2 mg/dL — ABNORMAL LOW (ref >39.00)
NonHDL: 109.8
Total CHOL/HDL Ratio: 5
Triglycerides: 268 mg/dL — ABNORMAL HIGH (ref 0.0–149.0)
VLDL: 53.6 mg/dL — ABNORMAL HIGH (ref 0.0–40.0)

## 2014-08-16 ENCOUNTER — Ambulatory Visit (HOSPITAL_COMMUNITY)
Admission: RE | Admit: 2014-08-16 | Discharge: 2014-08-16 | Disposition: A | Discharge status: Home / Self Care | Payer: BLUE CROSS/BLUE SHIELD | Source: Ambulatory Visit | Attending: Internal Medicine | Admitting: Internal Medicine

## 2014-08-16 ENCOUNTER — Other Ambulatory Visit: Payer: Self-pay | Admitting: *Deleted

## 2014-08-16 ENCOUNTER — Ambulatory Visit: Payer: Self-pay | Admitting: Surgery

## 2014-08-16 DIAGNOSIS — R7989 Other specified abnormal findings of blood chemistry: Secondary | ICD-10-CM

## 2014-08-16 DIAGNOSIS — R109 Unspecified abdominal pain: Secondary | ICD-10-CM

## 2014-08-16 DIAGNOSIS — R1011 Right upper quadrant pain: Secondary | ICD-10-CM | POA: Diagnosis not present

## 2014-08-16 DIAGNOSIS — R945 Abnormal results of liver function studies: Secondary | ICD-10-CM

## 2014-08-16 MED ORDER — SINCALIDE 5 MCG IJ SOLR
0.0200 ug/kg | Freq: Once | INTRAMUSCULAR | Status: AC
  Administered 2014-08-16: 1.2 ug via INTRAVENOUS

## 2014-08-16 MED ORDER — TECHNETIUM TC 99M MEBROFENIN IV KIT
5.0000 | PACK | Freq: Once | INTRAVENOUS | Status: AC | PRN
  Administered 2014-08-16: 5 via INTRAVENOUS

## 2014-08-16 MED ORDER — SINCALIDE 5 MCG IJ SOLR
INTRAMUSCULAR | Status: AC
  Administered 2014-08-16: 1.2 ug via INTRAVENOUS
  Filled 2014-08-16: qty 5

## 2014-08-16 NOTE — H&P (Signed)
History of Present Illness [name]. [name] Riehle MD; 08/16/2014 11:45 AM) Patient words: gallbladder.  The patient is a 54 year old female who presents for evaluation of gall stones. Referred by Dr. [name] for evaluation of symptomatic gallstones  This is a 54 year old female with a past medical history significant for Crohn's disease that has been managed medically. She recently had an episode of 3 days of nausea, vomiting, fever up to 103, diarrhea, and right upper quadrant abdominal pain. She has felt bloated for the last couple of years. She has not had any previous episodes but since her visit to the emergency room, she has had several mild episodes. These tend to occur after eating. She had a CT scan performed in October that showed mild thickening of the distal terminal ileum over a very short segment. She has a gallstone that was also noted. HIDA scan this morning was unremarkable for any cystic duct obstruction. However, because of her array of symptoms and the known gallstone as well as abnormal liver function test, she is now referred for surgical evaluation. When she was in the emergency department her bilirubin was elevated but has returned to normal.    Labs 08/15/14 08/02/14 T bili 0.3 2.1 D bili 0.1 Alk Phos 139 211 AST 18 146 ALT 30 213   CLINICAL DATA: 54 year old female with history of Crohn's disease presenting with abdominal bloating. Lower abdominal pain. Nausea and diarrhea.  EXAM: CT ABDOMEN AND PELVIS WITH CONTRAST  TECHNIQUE: Multidetector CT imaging of the abdomen and pelvis was performed using the standard protocol following bolus administration of intravenous contrast.  CONTRAST: 143m OMNIPAQUE IOHEXOL 300 MG/ML SOLN  COMPARISON: No priors.  FINDINGS: Lower chest: Unremarkable.  Hepatobiliary: No focal hepatic lesions. No intra or extrahepatic biliary  ductal dilatation. 2 mm calcified gallstone lying dependently in the gallbladder. No current findings to suggest an acute cholecystitis at this time.  Pancreas: Unremarkable.  Spleen: Unremarkable.  Adrenals/Urinary Tract: Bilateral kidneys and bilateral adrenal glands are normal in appearance. No hydroureteronephrosis. Urinary bladder is normal in appearance.  Stomach/Bowel: Stomach is normal in appearance. No pathologic dilatation of the small bowel or colon. Normal appendix. Mild lipomatous hypertrophy of the ileocecal valve. There is a short segment of bowel wall thickening in the distal ileum, shortly before the terminal ileum, best appreciated on image 41 of series 602 (coronal images). No overt surrounding inflammatory changes are noted at this time. No additional areas of bowel wall thickening or surrounding inflammatory changes are confidently identified in either the small bowel or colon.  Vascular/Lymphatic: Atherosclerosis throughout the abdominal and pelvic vasculature, without evidence of aneurysm or dissection. No lymphadenopathy noted in the abdomen or pelvis.  Reproductive: Uterus and ovaries are unremarkable in appearance.  Other: No significant volume of ascites. No pneumoperitoneum.  Musculoskeletal: There are no aggressive appearing lytic or blastic lesions noted in the visualized portions of the skeleton.  IMPRESSION: 1. There is a short segment of the distal ileum (shortly before the terminal ileum) which appears mildly thickened, but does not demonstrate overt surrounding inflammatory changes. This may be chronic and related to prior episodes of Crohn's exacerbation. No other acute findings are noted on today's examination. 2. Normal appendix. 3. Cholelithiasis without evidence to suggest acute cholecystitis at this time. 4. Atherosclerosis.   Electronically Signed By: DVinnie LangtonM.D. On: 03/19/2014 17:16   CLINICAL DATA: Right upper  quadrant pain  EXAM: NUCLEAR MEDICINE HEPATOBILIARY IMAGING WITH GALLBLADDER EF  TECHNIQUE: Sequential images of the abdomen were  obtained out to 60 minutes following intravenous administration of radiopharmaceutical. After slow intravenous infusion of 1.2 micrograms Cholecystokinin, gallbladder ejection fraction was determined.  RADIOPHARMACEUTICALS: 5 [employer] [id number] Choletec  COMPARISON: None.  FINDINGS: There is adequate uptake of radioactive tracer throughout the liver immediately following injection. Visualization of the biliary tree and gallbladder is noted at 15 minutes. Progressive filling of the gallbladder is noted to 60 minutes. Following CCK administration, the gallbladder ejection fraction is calculated at 85% which is well within normal limits. No pain was noted during CCK injection.  IMPRESSION: Normal uptake and excretion of biliary tracer.  Normal gallbladder ejection fraction.   Electronically Signed By: Inez Catalina M.D. On: 08/16/2014 10:38 Other Problems ([name], CMA; 08/16/2014 10:04 AM) Anxiety Disorder Arthritis Back Pain Crohn's Disease Diabetes Mellitus Gastroesophageal Reflux Disease High blood pressure Hypercholesterolemia  Past Surgical History [name], CMA; 08/16/2014 10:04 AM) Colon Polyp Removal - Colonoscopy  Diagnostic Studies History [name], CMA; 08/16/2014 10:04 AM) Colonoscopy 1-5 years ago Mammogram within last year Pap Smear 1-5 years ago  Allergies [name], CMA; 08/16/2014 10:05 AM) No Known Drug Allergies 08/16/2014  Medication History ([name], CMA; 08/16/2014 10:08 AM) Nitrofurantoin Monohyd Macro (100MG Capsule, Oral) Active. Cyclobenzaprine HCl (5MG Tablet, Oral) Active. Fluconazole (150MG Tablet, Oral) Active. Atorvastatin Calcium (40MG Tablet, Oral) Active. Lipitor (40MG Tablet, Oral) Active. Aspirin EC (81MG Tablet DR, Oral) Active. Enalapril Maleate (5MG Tablet,  Oral) Active. Sertraline HCl (50MG Tablet, Oral) Active. Zolpidem Tartrate (10MG Tablet, Oral) Active. Purinethol (50MG Tablet, Oral) Active. CeleBREX (200MG Capsule, Oral) Active. ALPRAZolam ER (0.5MG Tablet ER 24HR, Oral) Active. Medications Reconciled  Social History [name], CMA; 08/16/2014 10:04 AM) Alcohol use Occasional alcohol use. Caffeine use Carbonated beverages, Coffee, Tea. Illicit drug use Uses socially only. Tobacco use Former smoker.  Family History [name], [place]; 08/16/2014 10:04 AM) Alcohol Abuse Father. Cerebrovascular Accident Father. Heart Disease Brother, Father, Mother, Sister. Heart disease in female family member before age 78 Heart disease in female family member before age 37 Hypertension Brother, Mother, Sister. Ischemic Bowel Disease Mother.  Pregnancy / Birth History [name], [place]; 08/16/2014 10:04 AM) Age at menarche 56 years. Age of menopause <43 Contraceptive History Intrauterine device, Oral contraceptives. Gravida 3 Irregular periods Maternal age 68-20 Para 2     Review of Systems ([name]; 08/16/2014 10:04 AM) General Present- Fatigue. Not Present- Appetite Loss, Chills, Fever, Night Sweats, Weight Gain and Weight Loss. Skin Not Present- Change in Wart/Mole, Dryness, Hives, Jaundice, New Lesions, Non-Healing Wounds, Rash and Ulcer. HEENT Present- Seasonal Allergies and Sinus Pain. Not Present- Earache, Hearing Loss, Hoarseness, Nose Bleed, Oral Ulcers, Ringing in the Ears, Sore Throat, Visual Disturbances, Wears glasses/contact lenses and Yellow Eyes. Respiratory Present- Snoring. Not Present- Bloody sputum, Chronic Cough, Difficulty Breathing and Wheezing. Breast Not Present- Breast Mass, Breast Pain, Nipple Discharge and Skin Changes. Cardiovascular Not Present- Chest Pain, Difficulty Breathing Lying Down, Leg Cramps, Palpitations, Rapid Heart Rate, Shortness of Breath and Swelling of  Extremities. Gastrointestinal Present- Abdominal Pain, Bloating, Excessive gas and Nausea. Not Present- Bloody Stool, Change in Bowel Habits, Chronic diarrhea, Constipation, Difficulty Swallowing, Gets full quickly at meals, Hemorrhoids, Indigestion, Rectal Pain and Vomiting. Female Genitourinary Not Present- Frequency, Nocturia, Painful Urination, Pelvic Pain and Urgency. Musculoskeletal Present- Back Pain, Joint Pain, Joint Stiffness, Muscle Pain and Muscle Weakness. Not Present- Swelling of Extremities. Neurological Present- Decreased Memory, Headaches and Weakness. Not Present- Fainting, Numbness, Seizures, Tingling, Tremor and Trouble walking. Psychiatric Not Present- Anxiety, Bipolar, Change in  Sleep Pattern, Depression, Fearful and Frequent crying. Endocrine Present- Heat Intolerance and New Diabetes. Not Present- Cold Intolerance, Excessive Hunger, Hair Changes and Hot flashes.  Vitals ([name] CMA; 08/16/2014 10:05 AM) 08/16/2014 10:05 AM Weight: 132 lb Height: 62in Body Surface Area: 1.62 m Body Mass Index: 24.14 kg/m Temp.: 97.66F(Temporal)  Pulse: 75 (Regular)  BP: 124/74 (Sitting, Left Arm, Standard)     Physical Exam [name] Key K. Koree Staheli MD; 08/16/2014 11:45 AM)  The physical exam findings are as follows: Note:WDWN in NAD HEENT: EOMI, sclera anicteric Neck: No masses, no thyromegaly Lungs: CTA bilaterally; normal respiratory effort CV: Regular rate and rhythm; no murmurs Abd: +bowel sounds, soft, non-tender, no masses Ext: Well-perfused; no edema Skin: Warm, dry; no sign of jaundice    Assessment & Plan [name] Key K. Gennesis Hogland MD; 08/16/2014 10:24 AM)  CHRONIC CHOLECYSTITIS WITH CALCULUS (574.10  K80.10)  Current Plans Schedule for Surgery - Laparoscopic cholecystectomy with intraoperative cholangiogram. The surgical procedure has been discussed with the patient. Potential risks, benefits, alternative treatments, and expected outcomes have been explained.  All of the patient's questions at this time have been answered. The likelihood of reaching the patient's treatment goal is good. The patient understand the proposed surgical procedure and wishes to proceed.   [name]. Georgette Dover, MD, [facility] Surgery  General/ Trauma Surgery  08/16/2014 11:46 AM

## 2014-08-19 ENCOUNTER — Other Ambulatory Visit: Payer: Self-pay | Admitting: *Deleted

## 2014-08-21 ENCOUNTER — Telehealth: Payer: Self-pay | Admitting: Cardiology

## 2014-08-21 NOTE — Telephone Encounter (Signed)
Attempted several times to call back to Dr. Arville Lime office.  Call either would not go through or there was a fast busy signal.  Wanted to let office know Dr. Aundra Dubin is not back in office until 3/31, his primary nurse is out of the office until 3/30.

## 2014-08-21 NOTE — Telephone Encounter (Signed)
Request for surgical clearance:  1. What type of surgery is being performed? Lap Coli  2. When is this surgery scheduled? Not scheduled yet   3. Are there any medications that need to be held prior to surgery and how long? Not that she is aware of   4. Name of physician performing surgery? Dr. Prince Solian  5. What is your office phone and fax number? Fax # [phone]  6. Comments.. Fax for clearance was sent on 03/18. She states she will refax.

## 2014-08-22 NOTE — Telephone Encounter (Signed)
Follow Up       Pt calling to follow up on paperwork being faxed to Dr. Arville Lime office. Please call back and advise.

## 2014-08-22 NOTE — Telephone Encounter (Signed)
Spoke to [place] and to patient. Surgical Clearance Form is signed off by Dr. Aundra Dubin and scanned into chart under the Media tab. Kenney Houseman is printing it out to give to Dr. Prince Solian so patient can be scheduled for her Lap-Coli. Patient notified that faxed clearance has been received by Kenney Houseman at Dr. Arville Lime office at [facility]. Patient verbalized understanding and appreciation for the return call and information.

## 2014-08-22 NOTE — Telephone Encounter (Signed)
Still unable to reach Dr. Arville Lime office. Phone not working. Attempted a second phone number: [phone] - however office does not open until 0830. Will try again after office opens.

## 2014-08-30 ENCOUNTER — Other Ambulatory Visit: Payer: Self-pay | Admitting: Internal Medicine

## 2014-09-11 ENCOUNTER — Encounter (HOSPITAL_COMMUNITY)
Admission: RE | Admit: 2014-09-11 | Discharge: 2014-09-11 | Disposition: A | Discharge status: Home / Self Care | Payer: BLUE CROSS/BLUE SHIELD | Source: Ambulatory Visit | Attending: Surgery | Admitting: Surgery

## 2014-09-11 ENCOUNTER — Encounter (HOSPITAL_COMMUNITY): Payer: Self-pay

## 2014-09-11 DIAGNOSIS — K8010 Calculus of gallbladder with chronic cholecystitis without obstruction: Secondary | ICD-10-CM | POA: Diagnosis not present

## 2014-09-11 DIAGNOSIS — F329 Major depressive disorder, single episode, unspecified: Secondary | ICD-10-CM | POA: Diagnosis not present

## 2014-09-11 DIAGNOSIS — Z87891 Personal history of nicotine dependence: Secondary | ICD-10-CM | POA: Diagnosis not present

## 2014-09-11 DIAGNOSIS — M1990 Unspecified osteoarthritis, unspecified site: Secondary | ICD-10-CM | POA: Diagnosis not present

## 2014-09-11 DIAGNOSIS — I10 Essential (primary) hypertension: Secondary | ICD-10-CM | POA: Diagnosis not present

## 2014-09-11 DIAGNOSIS — E780 Pure hypercholesterolemia: Secondary | ICD-10-CM | POA: Diagnosis not present

## 2014-09-11 DIAGNOSIS — M797 Fibromyalgia: Secondary | ICD-10-CM | POA: Diagnosis not present

## 2014-09-11 DIAGNOSIS — F419 Anxiety disorder, unspecified: Secondary | ICD-10-CM | POA: Diagnosis not present

## 2014-09-11 DIAGNOSIS — K8020 Calculus of gallbladder without cholecystitis without obstruction: Secondary | ICD-10-CM | POA: Diagnosis present

## 2014-09-11 DIAGNOSIS — Z792 Long term (current) use of antibiotics: Secondary | ICD-10-CM | POA: Diagnosis not present

## 2014-09-11 DIAGNOSIS — Z79899 Other long term (current) drug therapy: Secondary | ICD-10-CM | POA: Diagnosis not present

## 2014-09-11 DIAGNOSIS — K219 Gastro-esophageal reflux disease without esophagitis: Secondary | ICD-10-CM | POA: Diagnosis not present

## 2014-09-11 DIAGNOSIS — Z791 Long term (current) use of non-steroidal anti-inflammatories (NSAID): Secondary | ICD-10-CM | POA: Diagnosis not present

## 2014-09-11 DIAGNOSIS — Z7982 Long term (current) use of aspirin: Secondary | ICD-10-CM | POA: Diagnosis not present

## 2014-09-11 DIAGNOSIS — E119 Type 2 diabetes mellitus without complications: Secondary | ICD-10-CM | POA: Diagnosis not present

## 2014-09-11 HISTORY — DX: Bronchitis, not specified as acute or chronic: J40

## 2014-09-11 HISTORY — DX: Pneumonia, unspecified organism: J18.9

## 2014-09-11 HISTORY — DX: Headache: R51

## 2014-09-11 HISTORY — DX: Headache, unspecified: R51.9

## 2014-09-11 HISTORY — DX: Unspecified osteoarthritis, unspecified site: M19.90

## 2014-09-11 LAB — BASIC METABOLIC PANEL
Anion gap: 7 (ref 5–15)
BUN: 11 mg/dL (ref 6–23)
CALCIUM: 9 mg/dL (ref 8.4–10.5)
CO2: 27 mmol/L (ref 19–32)
CREATININE: 0.59 mg/dL (ref 0.50–1.10)
Chloride: 104 mmol/L (ref 96–112)
GFR calc Af Amer: >90 mL/min (ref >90)
GLUCOSE: 279 mg/dL — AB (ref 70–99)
POTASSIUM: 3.7 mmol/L (ref 3.5–5.1)
Sodium: 138 mmol/L (ref 135–145)

## 2014-09-11 LAB — CBC
HCT: 38.9 % (ref 36.0–46.0)
HEMOGLOBIN: 12.9 g/dL (ref 12.0–15.0)
MCH: 30.8 pg (ref 26.0–34.0)
MCHC: 33.2 g/dL (ref 30.0–36.0)
MCV: 92.8 fL (ref 78.0–100.0)
PLATELETS: 281 10*3/uL (ref 150–400)
RBC: 4.19 MIL/uL (ref 3.87–5.11)
RDW: 13.4 % (ref 11.5–15.5)
WBC: 7.5 10*3/uL (ref 4.0–10.5)

## 2014-09-11 NOTE — Pre-Procedure Instructions (Addendum)
[name]  09/11/2014   Your procedure is scheduled on:  09/19/14  Report to [facility] cone short stay admitting at 730 AM.  Call this number if you have problems the morning of surgery: [phone]   Remember:   Do not eat food or drink liquids after midnight.   Take these medicines the morning of surgery with A SIP OF Water. meds taken at night          STOP all herbel meds, nsaids (aleve,naproxen,advil,ibuprofen) 5 days prior to surgery starting 09/14/14 including vitamins, aspirin, celebrex   Do not wear jewelry, make-up or nail polish.  Do not wear lotions, powders, or perfumes. You may wear deodorant.  Do not shave 48 hours prior to surgery. Men may shave face and neck.  Do not bring valuables to the hospital.  [facility] is not responsible                  for any belongings or valuables.               Contacts, dentures or bridgework may not be worn into surgery.  Leave suitcase in the car. After surgery it may be brought to your room.  For patients admitted to the hospital, discharge time is determined by your                treatment team.               Patients discharged the day of surgery will not be allowed to drive  home.  Name and phone number of your driver:   Special Instructions:  Special Instructions: [facility] - Preparing for Surgery  Before surgery, you can play an important role.  Because skin is not sterile, your skin needs to be as free of germs as possible.  You can reduce the number of germs on you skin by washing with CHG (chlorahexidine gluconate) soap before surgery.  CHG is an antiseptic cleaner which kills germs and bonds with the skin to continue killing germs even after washing.  Please DO NOT use if you have an allergy to CHG or antibacterial soaps.  If your skin becomes reddened/irritated stop using the CHG and inform your nurse when you arrive at Short Stay.  Do not shave (including legs and underarms) for at least 48 hours prior to the first CHG  shower.  You may shave your face.  Please follow these instructions carefully:   1.  Shower with CHG Soap the night before surgery and the morning of Surgery.  2.  If you choose to wash your hair, wash your hair first as usual with your normal shampoo.  3.  After you shampoo, rinse your hair and body thoroughly to remove the Shampoo.  4.  Use CHG as you would any other liquid soap.  You can apply chg directly  to the skin and wash gently with scrungie or a clean washcloth.  5.  Apply the CHG Soap to your body ONLY FROM THE NECK DOWN.  Do not use on open wounds or open sores.  Avoid contact with your eyes ears, mouth and genitals (private parts).  Wash genitals (private parts)       with your normal soap.  6.  Wash thoroughly, paying special attention to the area where your surgery will be performed.  7.  Thoroughly rinse your body with warm water from the neck down.  8.  DO NOT shower/wash with your normal soap after using  and rinsing off the CHG Soap.  9.  Pat yourself dry with a clean towel.            10.  Wear clean pajamas.            11.  Place clean sheets on your bed the night of your first shower and do not sleep with pets.  Day of Surgery  Do not apply any lotions/deodorants the morning of surgery.  Please wear clean clothes to the hospital/surgery center.   Please read over the following fact sheets that you were given: Pain Booklet, Coughing and Deep Breathing and Surgical Site Infection Prevention

## 2014-09-18 MED ORDER — CHLORHEXIDINE GLUCONATE 4 % EX LIQD
1.0000 "application " | Freq: Once | CUTANEOUS | Status: DC
  Filled 2014-09-18: qty 15

## 2014-09-18 MED ORDER — CEFAZOLIN SODIUM-DEXTROSE 2-3 GM-% IV SOLR
2.0000 g | INTRAVENOUS | Status: AC
  Administered 2014-09-19: 2 g via INTRAVENOUS
  Filled 2014-09-18: qty 50

## 2014-09-19 ENCOUNTER — Ambulatory Visit (HOSPITAL_COMMUNITY): Payer: BLUE CROSS/BLUE SHIELD | Admitting: Anesthesiology

## 2014-09-19 ENCOUNTER — Ambulatory Visit (HOSPITAL_COMMUNITY)
Admission: RE | Admit: 2014-09-19 | Discharge: 2014-09-19 | Disposition: A | Discharge status: Home / Self Care | Payer: BLUE CROSS/BLUE SHIELD | Source: Ambulatory Visit | Attending: Surgery | Admitting: Surgery

## 2014-09-19 ENCOUNTER — Encounter (HOSPITAL_COMMUNITY)
Admission: RE | Disposition: A | Discharge status: Home / Self Care | Payer: Self-pay | Source: Ambulatory Visit | Attending: Surgery

## 2014-09-19 ENCOUNTER — Encounter (HOSPITAL_COMMUNITY): Payer: Self-pay | Admitting: *Deleted

## 2014-09-19 ENCOUNTER — Ambulatory Visit (HOSPITAL_COMMUNITY): Payer: BLUE CROSS/BLUE SHIELD

## 2014-09-19 DIAGNOSIS — I10 Essential (primary) hypertension: Secondary | ICD-10-CM | POA: Insufficient documentation

## 2014-09-19 DIAGNOSIS — F419 Anxiety disorder, unspecified: Secondary | ICD-10-CM | POA: Insufficient documentation

## 2014-09-19 DIAGNOSIS — Z791 Long term (current) use of non-steroidal anti-inflammatories (NSAID): Secondary | ICD-10-CM | POA: Insufficient documentation

## 2014-09-19 DIAGNOSIS — Z79899 Other long term (current) drug therapy: Secondary | ICD-10-CM | POA: Insufficient documentation

## 2014-09-19 DIAGNOSIS — F329 Major depressive disorder, single episode, unspecified: Secondary | ICD-10-CM | POA: Insufficient documentation

## 2014-09-19 DIAGNOSIS — M1990 Unspecified osteoarthritis, unspecified site: Secondary | ICD-10-CM | POA: Insufficient documentation

## 2014-09-19 DIAGNOSIS — Z87891 Personal history of nicotine dependence: Secondary | ICD-10-CM | POA: Insufficient documentation

## 2014-09-19 DIAGNOSIS — Z7982 Long term (current) use of aspirin: Secondary | ICD-10-CM | POA: Insufficient documentation

## 2014-09-19 DIAGNOSIS — E780 Pure hypercholesterolemia: Secondary | ICD-10-CM | POA: Insufficient documentation

## 2014-09-19 DIAGNOSIS — K219 Gastro-esophageal reflux disease without esophagitis: Secondary | ICD-10-CM | POA: Insufficient documentation

## 2014-09-19 DIAGNOSIS — E119 Type 2 diabetes mellitus without complications: Secondary | ICD-10-CM | POA: Insufficient documentation

## 2014-09-19 DIAGNOSIS — K8010 Calculus of gallbladder with chronic cholecystitis without obstruction: Secondary | ICD-10-CM | POA: Diagnosis not present

## 2014-09-19 DIAGNOSIS — Z419 Encounter for procedure for purposes other than remedying health state, unspecified: Secondary | ICD-10-CM

## 2014-09-19 DIAGNOSIS — M797 Fibromyalgia: Secondary | ICD-10-CM | POA: Insufficient documentation

## 2014-09-19 DIAGNOSIS — Z792 Long term (current) use of antibiotics: Secondary | ICD-10-CM | POA: Insufficient documentation

## 2014-09-19 HISTORY — PX: CHOLECYSTECTOMY: SHX55

## 2014-09-19 LAB — GLUCOSE, CAPILLARY
GLUCOSE-CAPILLARY: 239 mg/dL — AB (ref 70–99)
Glucose-Capillary: 157 mg/dL — ABNORMAL HIGH (ref 70–99)
Glucose-Capillary: 197 mg/dL — ABNORMAL HIGH (ref 70–99)

## 2014-09-19 SURGERY — LAPAROSCOPIC CHOLECYSTECTOMY WITH INTRAOPERATIVE CHOLANGIOGRAM
Anesthesia: General | Site: Abdomen

## 2014-09-19 MED ORDER — MORPHINE SULFATE 2 MG/ML IJ SOLN: 2.0000 mg | INTRAMUSCULAR | Status: DC | PRN

## 2014-09-19 MED ORDER — MIDAZOLAM HCL 5 MG/5ML IJ SOLN
INTRAMUSCULAR | Status: DC | PRN
  Administered 2014-09-19: 2 mg via INTRAVENOUS

## 2014-09-19 MED ORDER — DEXAMETHASONE SODIUM PHOSPHATE 4 MG/ML IJ SOLN
INTRAMUSCULAR | Status: DC | PRN
  Administered 2014-09-19: 4 mg via INTRAVENOUS

## 2014-09-19 MED ORDER — LIDOCAINE HCL (CARDIAC) 20 MG/ML IV SOLN
INTRAVENOUS | Status: AC
  Filled 2014-09-19: qty 5

## 2014-09-19 MED ORDER — OXYCODONE HCL 5 MG PO TABS: 5.0000 mg | ORAL_TABLET | Freq: Once | ORAL | Status: DC | PRN

## 2014-09-19 MED ORDER — SODIUM CHLORIDE 0.9 % IR SOLN
Status: DC | PRN
  Administered 2014-09-19: 1000 mL

## 2014-09-19 MED ORDER — FENTANYL CITRATE (PF) 100 MCG/2ML IJ SOLN
INTRAMUSCULAR | Status: DC | PRN
  Administered 2014-09-19 (×2): 50 ug via INTRAVENOUS

## 2014-09-19 MED ORDER — OXYCODONE-ACETAMINOPHEN 5-325 MG PO TABS: 1.0000 | ORAL_TABLET | ORAL | Status: DC | PRN

## 2014-09-19 MED ORDER — NEOSTIGMINE METHYLSULFATE 10 MG/10ML IV SOLN
INTRAVENOUS | Status: DC | PRN
Start: 2014-09-19 — End: 2014-09-19
  Administered 2014-09-19: 4 mg via INTRAVENOUS

## 2014-09-19 MED ORDER — FENTANYL CITRATE (PF) 250 MCG/5ML IJ SOLN
INTRAMUSCULAR | Status: AC
  Filled 2014-09-19: qty 5

## 2014-09-19 MED ORDER — ACETAMINOPHEN 10 MG/ML IV SOLN
1000.0000 mg | INTRAVENOUS | Status: AC
  Administered 2014-09-19: 1000 mg via INTRAVENOUS
  Filled 2014-09-19: qty 100

## 2014-09-19 MED ORDER — ONDANSETRON HCL 4 MG/2ML IJ SOLN
INTRAMUSCULAR | Status: DC | PRN
  Administered 2014-09-19: 4 mg via INTRAVENOUS

## 2014-09-19 MED ORDER — MIDAZOLAM HCL 2 MG/2ML IJ SOLN
INTRAMUSCULAR | Status: AC
  Filled 2014-09-19: qty 2

## 2014-09-19 MED ORDER — GLYCOPYRROLATE 0.2 MG/ML IJ SOLN
INTRAMUSCULAR | Status: DC | PRN
  Administered 2014-09-19: .5 mg via INTRAVENOUS

## 2014-09-19 MED ORDER — ONDANSETRON HCL 4 MG/2ML IJ SOLN
INTRAMUSCULAR | Status: AC
  Filled 2014-09-19: qty 2

## 2014-09-19 MED ORDER — ONDANSETRON HCL 4 MG/2ML IJ SOLN
4.0000 mg | INTRAMUSCULAR | Status: DC | PRN
  Filled 2014-09-19: qty 2

## 2014-09-19 MED ORDER — BUPIVACAINE-EPINEPHRINE 0.25% -1:200000 IJ SOLN
INTRAMUSCULAR | Status: DC | PRN
  Administered 2014-09-19: 4 mL

## 2014-09-19 MED ORDER — ROCURONIUM BROMIDE 100 MG/10ML IV SOLN
INTRAVENOUS | Status: DC | PRN
  Administered 2014-09-19: 40 mg via INTRAVENOUS

## 2014-09-19 MED ORDER — 0.9 % SODIUM CHLORIDE (POUR BTL) OPTIME
TOPICAL | Status: DC | PRN
  Administered 2014-09-19: 1000 mL

## 2014-09-19 MED ORDER — PHENYLEPHRINE 40 MCG/ML (10ML) SYRINGE FOR IV PUSH (FOR BLOOD PRESSURE SUPPORT)
PREFILLED_SYRINGE | INTRAVENOUS | Status: AC
  Filled 2014-09-19: qty 10

## 2014-09-19 MED ORDER — LIDOCAINE HCL (CARDIAC) 20 MG/ML IV SOLN
INTRAVENOUS | Status: DC | PRN
  Administered 2014-09-19: 40 mg via INTRAVENOUS
  Administered 2014-09-19: 100 mg via INTRATRACHEAL

## 2014-09-19 MED ORDER — LACTATED RINGERS IV SOLN
INTRAVENOUS | Status: DC
  Administered 2014-09-19 (×2): via INTRAVENOUS

## 2014-09-19 MED ORDER — HYDROMORPHONE HCL 1 MG/ML IJ SOLN
INTRAMUSCULAR | Status: AC
  Administered 2014-09-19: 0.5 mg via INTRAVENOUS
  Filled 2014-09-19: qty 1

## 2014-09-19 MED ORDER — OXYCODONE HCL 5 MG/5ML PO SOLN: 5.0000 mg | Freq: Once | ORAL | Status: DC | PRN

## 2014-09-19 MED ORDER — SODIUM CHLORIDE 0.9 % IV SOLN
INTRAVENOUS | Status: DC | PRN
  Administered 2014-09-19: 10 mL

## 2014-09-19 MED ORDER — PROPOFOL 10 MG/ML IV BOLUS
INTRAVENOUS | Status: DC | PRN
  Administered 2014-09-19: 150 mg via INTRAVENOUS

## 2014-09-19 MED ORDER — DEXAMETHASONE SODIUM PHOSPHATE 4 MG/ML IJ SOLN
INTRAMUSCULAR | Status: AC
  Filled 2014-09-19: qty 1

## 2014-09-19 MED ORDER — HYDROMORPHONE HCL 1 MG/ML IJ SOLN
0.2500 mg | INTRAMUSCULAR | Status: DC | PRN
  Administered 2014-09-19 (×2): 0.5 mg via INTRAVENOUS

## 2014-09-19 MED ORDER — ROCURONIUM BROMIDE 50 MG/5ML IV SOLN
INTRAVENOUS | Status: AC
  Filled 2014-09-19: qty 1

## 2014-09-19 MED ORDER — PROPOFOL 10 MG/ML IV BOLUS
INTRAVENOUS | Status: AC
  Filled 2014-09-19: qty 20

## 2014-09-19 MED ORDER — ONDANSETRON HCL 4 MG/2ML IJ SOLN: 4.0000 mg | Freq: Four times a day (QID) | INTRAMUSCULAR | Status: DC | PRN

## 2014-09-19 MED ORDER — BUPIVACAINE-EPINEPHRINE (PF) 0.25% -1:200000 IJ SOLN
INTRAMUSCULAR | Status: AC
  Filled 2014-09-19: qty 30

## 2014-09-19 SURGICAL SUPPLY — 45 items
APL SKNCLS STERI-STRIP NONHPOA (GAUZE/BANDAGES/DRESSINGS) ×1
APPLIER CLIP ROT 10 11.4 M/L (STAPLE) ×2
APR CLP MED LRG 11.4X10 (STAPLE) ×1
BAG SPEC RTRVL LRG 6X4 10 (ENDOMECHANICALS) ×1
BENZOIN TINCTURE PRP APPL 2/3 (GAUZE/BANDAGES/DRESSINGS) ×2 IMPLANT
BLADE SURG ROTATE 9660 (MISCELLANEOUS) IMPLANT
CANISTER SUCTION 2500CC (MISCELLANEOUS) ×2 IMPLANT
CHLORAPREP W/TINT 26ML (MISCELLANEOUS) ×2 IMPLANT
CLIP APPLIE ROT 10 11.4 M/L (STAPLE) ×1 IMPLANT
CLSR STERI-STRIP ANTIMIC 1/2X4 (GAUZE/BANDAGES/DRESSINGS) ×1 IMPLANT
COVER MAYO STAND STRL (DRAPES) ×2 IMPLANT
COVER SURGICAL LIGHT HANDLE (MISCELLANEOUS) ×2 IMPLANT
DRAPE C-ARM 42X72 X-RAY (DRAPES) ×2 IMPLANT
DRAPE LAPAROSCOPIC ABDOMINAL (DRAPES) ×2 IMPLANT
DRAPE PROXIMA HALF (DRAPES) ×1 IMPLANT
DRSG TEGADERM 2-3/8X2-3/4 SM (GAUZE/BANDAGES/DRESSINGS) ×4 IMPLANT
DRSG TEGADERM 4X4.75 (GAUZE/BANDAGES/DRESSINGS) ×2 IMPLANT
ELECT REM PT RETURN 9FT ADLT (ELECTROSURGICAL) ×2
ELECTRODE REM PT RTRN 9FT ADLT (ELECTROSURGICAL) ×1 IMPLANT
FILTER SMOKE EVAC LAPAROSHD (FILTER) ×2 IMPLANT
GAUZE SPONGE 2X2 8PLY STRL LF (GAUZE/BANDAGES/DRESSINGS) ×1 IMPLANT
GLOVE BIO SURGEON STRL SZ7 (GLOVE) ×2 IMPLANT
GLOVE BIOGEL PI IND STRL 7.5 (GLOVE) ×1 IMPLANT
GLOVE BIOGEL PI INDICATOR 7.5 (GLOVE) ×1
GOWN STRL REUS W/ TWL LRG LVL3 (GOWN DISPOSABLE) ×3 IMPLANT
GOWN STRL REUS W/TWL LRG LVL3 (GOWN DISPOSABLE) ×6
KIT BASIN OR (CUSTOM PROCEDURE TRAY) ×2 IMPLANT
KIT ROOM TURNOVER OR (KITS) ×2 IMPLANT
NS IRRIG 1000ML POUR BTL (IV SOLUTION) ×2 IMPLANT
PAD ARMBOARD 7.5X6 YLW CONV (MISCELLANEOUS) ×2 IMPLANT
POUCH SPECIMEN RETRIEVAL 10MM (ENDOMECHANICALS) ×2 IMPLANT
SCISSORS LAP 5X35 DISP (ENDOMECHANICALS) ×2 IMPLANT
SET CHOLANGIOGRAPH 5 50 .035 (SET/KITS/TRAYS/PACK) ×2 IMPLANT
SET IRRIG TUBING LAPAROSCOPIC (IRRIGATION / IRRIGATOR) ×2 IMPLANT
SLEEVE ENDOPATH XCEL 5M (ENDOMECHANICALS) ×2 IMPLANT
SPECIMEN JAR SMALL (MISCELLANEOUS) ×2 IMPLANT
SPONGE GAUZE 2X2 STER 10/PKG (GAUZE/BANDAGES/DRESSINGS) ×1
SUT MNCRL AB 4-0 PS2 18 (SUTURE) ×2 IMPLANT
TOWEL OR 17X24 6PK STRL BLUE (TOWEL DISPOSABLE) ×2 IMPLANT
TOWEL OR 17X26 10 PK STRL BLUE (TOWEL DISPOSABLE) ×2 IMPLANT
TRAY LAPAROSCOPIC (CUSTOM PROCEDURE TRAY) ×2 IMPLANT
TROCAR XCEL BLUNT TIP 100MML (ENDOMECHANICALS) ×2 IMPLANT
TROCAR XCEL NON-BLD 11X100MML (ENDOMECHANICALS) ×2 IMPLANT
TROCAR XCEL NON-BLD 5MMX100MML (ENDOMECHANICALS) ×2 IMPLANT
TUBING INSUFFLATION (TUBING) ×2 IMPLANT

## 2014-09-19 NOTE — Anesthesia Postprocedure Evaluation (Signed)
Anesthesia Post Note  Patient: [name]  Procedure(s) Performed: Procedure(s) (LRB): LAPAROSCOPIC CHOLECYSTECTOMY WITH INTRAOPERATIVE CHOLANGIOGRAM (N/A)  Anesthesia type: General  Patient location: PACU  Post pain: Pain level controlled and Adequate analgesia  Post assessment: Post-op Vital signs reviewed, Patient's Cardiovascular Status Stable, Respiratory Function Stable, Patent Airway and Pain level controlled  Last Vitals:  Filed Vitals:   09/19/14 1110  BP: 143/63  Pulse: 62  Temp:   Resp: 22    Post vital signs: Reviewed and stable  Level of consciousness: awake, alert  and oriented  Complications: No apparent anesthesia complications

## 2014-09-19 NOTE — Anesthesia Procedure Notes (Signed)
Procedure Name: Intubation Date/Time: 09/19/2014 9:22 AM Performed by: Jenne Campus Pre-anesthesia Checklist: Patient identified, Emergency Drugs available, Suction available, Patient being monitored and Timeout performed Patient Re-evaluated:Patient Re-evaluated prior to inductionOxygen Delivery Method: Circle system utilized Preoxygenation: Pre-oxygenation with 100% oxygen Intubation Type: IV induction Ventilation: Mask ventilation without difficulty Laryngoscope Size: Miller and 2 Grade View: Grade I Tube type: Oral Tube size: 7.0 mm Number of attempts: 1 Airway Equipment and Method: Stylet and LTA kit utilized Placement Confirmation: ETT inserted through vocal cords under direct vision,  positive ETCO2,  CO2 detector and breath sounds checked- equal and bilateral Secured at: 21 cm Tube secured with: Tape Dental Injury: Teeth and Oropharynx as per pre-operative assessment

## 2014-09-19 NOTE — Transfer of Care (Signed)
Immediate Anesthesia Transfer of Care Note  Patient: [name]  Procedure(s) Performed: Procedure(s): LAPAROSCOPIC CHOLECYSTECTOMY WITH INTRAOPERATIVE CHOLANGIOGRAM (N/A)  Patient Location: PACU  Anesthesia Type:General  Level of Consciousness: awake, oriented and patient cooperative  Airway & Oxygen Therapy: Patient Spontanous Breathing and Patient connected to nasal cannula oxygen  Post-op Assessment: Report given to RN and Post -op Vital signs reviewed and stable  Post vital signs: Reviewed  Last Vitals:  Filed Vitals:   09/19/14 0756  BP:   Pulse:   Temp: 36.6 C  Resp:     Complications: No apparent anesthesia complications

## 2014-09-19 NOTE — H&P (Signed)
History of Present Illness  Patient words: gallbladder.  The patient is a 54 year old female who presents for evaluation of gall stones. Referred by Dr. [name] for evaluation of symptomatic gallstones  This is a 54 year old female with a past medical history significant for Crohn's disease that has been managed medically. She recently had an episode of 3 days of nausea, vomiting, fever up to 103, diarrhea, and right upper quadrant abdominal pain. She has felt bloated for the last couple of years. She has not had any previous episodes but since her visit to the emergency room, she has had several mild episodes. These tend to occur after eating. She had a CT scan performed in October that showed mild thickening of the distal terminal ileum over a very short segment. She has a gallstone that was also noted. HIDA scan this morning was unremarkable for any cystic duct obstruction. However, because of her array of symptoms and the known gallstone as well as abnormal liver function test, she is now referred for surgical evaluation. When she was in the emergency department her bilirubin was elevated but has returned to normal.    Labs 08/15/14 08/02/14 T bili 0.3 2.1 D bili 0.1 Alk Phos 139 211 AST 18 146 ALT 30 213   CLINICAL DATA: 54 year old female with history of Crohn's disease presenting with abdominal bloating. Lower abdominal pain. Nausea and diarrhea.  EXAM: CT ABDOMEN AND PELVIS WITH CONTRAST  TECHNIQUE: Multidetector CT imaging of the abdomen and pelvis was performed using the standard protocol following bolus administration of intravenous contrast.  CONTRAST: 161m OMNIPAQUE IOHEXOL 300 MG/ML SOLN  COMPARISON: No priors.  FINDINGS: Lower chest: Unremarkable.  Hepatobiliary: No focal hepatic lesions. No intra or extrahepatic biliary ductal dilatation. 2 mm calcified gallstone  lying dependently in the gallbladder. No current findings to suggest an acute cholecystitis at this time.  Pancreas: Unremarkable.  Spleen: Unremarkable.  Adrenals/Urinary Tract: Bilateral kidneys and bilateral adrenal glands are normal in appearance. No hydroureteronephrosis. Urinary bladder is normal in appearance.  Stomach/Bowel: Stomach is normal in appearance. No pathologic dilatation of the small bowel or colon. Normal appendix. Mild lipomatous hypertrophy of the ileocecal valve. There is a short segment of bowel wall thickening in the distal ileum, shortly before the terminal ileum, best appreciated on image 41 of series 602 (coronal images). No overt surrounding inflammatory changes are noted at this time. No additional areas of bowel wall thickening or surrounding inflammatory changes are confidently identified in either the small bowel or colon.  Vascular/Lymphatic: Atherosclerosis throughout the abdominal and pelvic vasculature, without evidence of aneurysm or dissection. No lymphadenopathy noted in the abdomen or pelvis.  Reproductive: Uterus and ovaries are unremarkable in appearance.  Other: No significant volume of ascites. No pneumoperitoneum.  Musculoskeletal: There are no aggressive appearing lytic or blastic lesions noted in the visualized portions of the skeleton.  IMPRESSION: 1. There is a short segment of the distal ileum (shortly before the terminal ileum) which appears mildly thickened, but does not demonstrate overt surrounding inflammatory changes. This may be chronic and related to prior episodes of Crohn's exacerbation. No other acute findings are noted on today's examination. 2. Normal appendix. 3. Cholelithiasis without evidence to suggest acute cholecystitis at this time. 4. Atherosclerosis.   Electronically Signed By: DVinnie LangtonM.D. On: 03/19/2014 17:16   CLINICAL DATA: Right upper quadrant pain  EXAM: NUCLEAR MEDICINE  HEPATOBILIARY IMAGING WITH GALLBLADDER EF  TECHNIQUE: Sequential images of the abdomen were obtained out to 60 minutes following  intravenous administration of radiopharmaceutical. After slow intravenous infusion of 1.2 micrograms Cholecystokinin, gallbladder ejection fraction was determined.  RADIOPHARMACEUTICALS: 5 [employer] [id number] Choletec  COMPARISON: None.  FINDINGS: There is adequate uptake of radioactive tracer throughout the liver immediately following injection. Visualization of the biliary tree and gallbladder is noted at 15 minutes. Progressive filling of the gallbladder is noted to 60 minutes. Following CCK administration, the gallbladder ejection fraction is calculated at 85% which is well within normal limits. No pain was noted during CCK injection.  IMPRESSION: Normal uptake and excretion of biliary tracer.  Normal gallbladder ejection fraction.   Electronically Signed By: Inez Catalina M.D. On: 08/16/2014 10:38 Other Problems (Sonya Bynum, CMA; 08/16/2014 10:04 AM) Anxiety Disorder Arthritis Back Pain Crohn's Disease Diabetes Mellitus Gastroesophageal Reflux Disease High blood pressure Hypercholesterolemia  Past Surgical History  Colon Polyp Removal - Colonoscopy  Diagnostic Studies History  Colonoscopy 1-5 years ago Mammogram within last year Pap Smear 1-5 years ago  Allergies  No Known Drug Allergies 08/16/2014  Medication History  Nitrofurantoin Monohyd Macro (100MG Capsule, Oral) Active. Cyclobenzaprine HCl (5MG Tablet, Oral) Active. Fluconazole (150MG Tablet, Oral) Active. Atorvastatin Calcium (40MG Tablet, Oral) Active. Lipitor (40MG Tablet, Oral) Active. Aspirin EC (81MG Tablet DR, Oral) Active. Enalapril Maleate (5MG Tablet, Oral) Active. Sertraline HCl (50MG Tablet, Oral) Active. Zolpidem Tartrate (10MG Tablet, Oral) Active. Purinethol (50MG Tablet, Oral) Active. CeleBREX (200MG Capsule, Oral)  Active. ALPRAZolam ER (0.5MG Tablet ER 24HR, Oral) Active. Medications Reconciled  Social History  Alcohol use Occasional alcohol use. Caffeine use Carbonated beverages, Coffee, Tea. Illicit drug use Uses socially only. Tobacco use Former smoker.  Family History  Alcohol Abuse Father. Cerebrovascular Accident Father. Heart Disease Brother, Father, Mother, Sister. Heart disease in female family member before age 77 Heart disease in female family member before age 1 Hypertension Brother, Mother, Sister. Ischemic Bowel Disease Mother.  Pregnancy / Birth History  Age at menarche 55 years. Age of menopause <43 Contraceptive History Intrauterine device, Oral contraceptives. Gravida 3 Irregular periods Maternal age 17-20 Para 2     Review of Systems General Present- Fatigue. Not Present- Appetite Loss, Chills, Fever, Night Sweats, Weight Gain and Weight Loss. Skin Not Present- Change in Wart/Mole, Dryness, Hives, Jaundice, New Lesions, Non-Healing Wounds, Rash and Ulcer. HEENT Present- Seasonal Allergies and Sinus Pain. Not Present- Earache, Hearing Loss, Hoarseness, Nose Bleed, Oral Ulcers, Ringing in the Ears, Sore Throat, Visual Disturbances, Wears glasses/contact lenses and Yellow Eyes. Respiratory Present- Snoring. Not Present- Bloody sputum, Chronic Cough, Difficulty Breathing and Wheezing. Breast Not Present- Breast Mass, Breast Pain, Nipple Discharge and Skin Changes. Cardiovascular Not Present- Chest Pain, Difficulty Breathing Lying Down, Leg Cramps, Palpitations, Rapid Heart Rate, Shortness of Breath and Swelling of Extremities. Gastrointestinal Present- Abdominal Pain, Bloating, Excessive gas and Nausea. Not Present- Bloody Stool, Change in Bowel Habits, Chronic diarrhea, Constipation, Difficulty Swallowing, Gets full quickly at meals, Hemorrhoids, Indigestion, Rectal Pain and Vomiting. Female Genitourinary Not Present- Frequency, Nocturia, Painful  Urination, Pelvic Pain and Urgency. Musculoskeletal Present- Back Pain, Joint Pain, Joint Stiffness, Muscle Pain and Muscle Weakness. Not Present- Swelling of Extremities. Neurological Present- Decreased Memory, Headaches and Weakness. Not Present- Fainting, Numbness, Seizures, Tingling, Tremor and Trouble walking. Psychiatric Not Present- Anxiety, Bipolar, Change in Sleep Pattern, Depression, Fearful and Frequent crying. Endocrine Present- Heat Intolerance and New Diabetes. Not Present- Cold Intolerance, Excessive Hunger, Hair Changes and Hot flashes.  Vitals  08/16/2014 10:05 AM Weight: 132 lb Height: 62in Body Surface Area: 1.62 m Body Mass Index: 24.14 kg/m Temp.:  97.45F(Temporal)  Pulse: 75 (Regular)  BP: 124/74 (Sitting, Left Arm, Standard)     Physical Exam   The physical exam findings are as follows: Note:WDWN in NAD HEENT: EOMI, sclera anicteric Neck: No masses, no thyromegaly Lungs: CTA bilaterally; normal respiratory effort CV: Regular rate and rhythm; no murmurs Abd: +bowel sounds, soft, non-tender, no masses Ext: Well-perfused; no edema Skin: Warm, dry; no sign of jaundice    Assessment & Plan   CHRONIC CHOLECYSTITIS WITH CALCULUS (574.10  K80.10)  Current Plans Schedule for Surgery - Laparoscopic cholecystectomy with intraoperative cholangiogram. The surgical procedure has been discussed with the patient. Potential risks, benefits, alternative treatments, and expected outcomes have been explained. All of the patient's questions at this time have been answered. The likelihood of reaching the patient's treatment goal is good. The patient understand the proposed surgical procedure and wishes to proceed.   Imogene Burn. Georgette Dover, MD, [facility]  General/ Trauma Surgery  09/19/2014 7:26 AM

## 2014-09-19 NOTE — Discharge Instructions (Signed)
[facility] LAPAROSCOPIC SURGERY: POST OP INSTRUCTIONS Always review your discharge instruction sheet given to you by the facility where your surgery was performed. IF YOU HAVE DISABILITY OR FAMILY LEAVE FORMS, YOU MUST BRING THEM TO THE OFFICE FOR PROCESSING.   DO NOT GIVE THEM TO YOUR DOCTOR.  1. A prescription for pain medication will be given to you upon discharge.  Take your pain medication as prescribed, if needed.  If narcotic pain medicine is not needed, then you may take acetaminophen (Tylenol) or ibuprofen (Advil) as needed. 2. Take your usually prescribed medications unless otherwise directed. 3. If you need a refill on your pain medication, please contact your pharmacy.  They will contact our office to request authorization. Prescriptions will not be filled after 5pm or on week-ends. 4. You should follow a light diet the first few days after arrival home, such as soup and crackers, etc.  Be sure to include lots of fluids daily. 5. Most patients will experience some swelling and bruising in the area of the incisions.  Ice packs will help.  Swelling and bruising can take several days to resolve.  6. It is common to experience some constipation if taking pain medication after surgery.  Increasing fluid intake and taking a stool softener (such as Colace) will usually help or prevent this problem from occurring.  A mild laxative (Milk of Magnesia or Miralax) should be taken according to package instructions if there are no bowel movements after 48 hours. 7. Unless discharge instructions indicate otherwise, you may remove your bandages 48 hours after surgery, and you may shower at that time.  You will have steri-strips (small skin tapes) in place directly over the incision.  These strips should be left on the skin for 7-10 days.  If your surgeon used skin glue on the incision, you may shower in 24 hours.  The glue will flake off over the next 2-3 weeks.  Any sutures or staples  will be removed at the office during your follow-up visit. 8. ACTIVITIES:  You may resume regular (light) daily activities beginning the next day--such as daily self-care, walking, climbing stairs--gradually increasing activities as tolerated.  You may have sexual intercourse when it is comfortable.  Refrain from any heavy lifting or straining until approved by your doctor. a. You may drive when you are no longer taking prescription pain medication, you can comfortably wear a seatbelt, and you can safely maneuver your car and apply brakes. b. RETURN TO WORK:   2-3 weeks 9. You should see your doctor in the office for a follow-up appointment approximately 2-3 weeks after your surgery.  Make sure that you call for this appointment within a day or two after you arrive home to insure a convenient appointment time. 10. OTHER INSTRUCTIONS: ________________________________________________________________________ WHEN TO CALL YOUR DOCTOR: 1. Fever over 101.0 2. Inability to urinate 3. Continued bleeding from incision. 4. Increased pain, redness, or drainage from the incision. 5. Increasing abdominal pain  The clinic staff is available to answer your questions during regular business hours.  Please dont hesitate to call and ask to speak to one of the nurses for clinical concerns.  If you have a medical emergency, go to the nearest emergency room or call 911.  A surgeon from [facility] is always on call at the hospital. [address], [place], [place], [place]  [address] ? [address], [place], [place]   [address] [phone] ? [phone] ? FAX [phone] Web site:  [email or web address]   General Anesthesia, Care After Refer to this sheet in the next few weeks. These instructions provide you with information on caring for yourself after your procedure. Your health care provider may also give you more specific instructions. Your treatment has been planned according to  current medical practices, but problems sometimes occur. Call your health care provider if you have any problems or questions after your procedure. WHAT TO EXPECT AFTER THE PROCEDURE After the procedure, it is typical to experience:  Sleepiness.  Nausea and vomiting. HOME CARE INSTRUCTIONS  For the first 24 hours after general anesthesia:  Have a responsible person with you.  Do not drive a car. If you are alone, do not take public transportation.  Do not drink alcohol.  Do not take medicine that has not been prescribed by your health care provider.  Do not sign important papers or make important decisions.  You may resume a normal diet and activities as directed by your health care provider.  Change bandages (dressings) as directed.  If you have questions or problems that seem related to general anesthesia, call the hospital and ask for the anesthetist or anesthesiologist on call. SEEK MEDICAL CARE IF:  You have nausea and vomiting that continue the day after anesthesia.  You develop a rash. SEEK IMMEDIATE MEDICAL CARE IF:   You have difficulty breathing.  You have chest pain.  You have any allergic problems. Document Released: 08/23/2000 Document Revised: 05/22/2013 Document Reviewed: 11/30/2012 [facility] Patient Information 2015 [place], [place]. This information is not intended to replace advice given to you by your health care provider. Make sure you discuss any questions you have with your health care provider.

## 2014-09-19 NOTE — Anesthesia Preprocedure Evaluation (Addendum)
Anesthesia Evaluation  Patient identified by MRN, date of birth, ID band Patient awake    Reviewed: Allergy & Precautions, NPO status , Patient's Chart, lab work & pertinent test results  History of Anesthesia Complications Negative for: history of anesthetic complications  Airway Mallampati: II  TM Distance: >3 FB Neck ROM: full    Dental  (+) Caps, Dental Advisory Given   Pulmonary former smoker,  breath sounds clear to auscultation        Cardiovascular hypertension, Pt. on medications Rhythm:regular Rate:Normal     Neuro/Psych  Headaches, Anxiety Depression    GI/Hepatic Neg liver ROS, GERD-  Medicated and Controlled,  Endo/Other  diabetes, Type 2Glucose = 157  Renal/GU negative Renal ROS     Musculoskeletal  (+) Arthritis -, Fibromyalgia -  Abdominal   Peds  Hematology negative hematology ROS (+)   Anesthesia Other Findings   Reproductive/Obstetrics                            Anesthesia Physical Anesthesia Plan  ASA: II  Anesthesia Plan: General   Post-op Pain Management:    Induction: Intravenous  Airway Management Planned: Oral ETT  Additional Equipment:   Intra-op Plan:   Post-operative Plan: Extubation in OR  Informed Consent: I have reviewed the patients History and Physical, chart, labs and discussed the procedure including the risks, benefits and alternatives for the proposed anesthesia with the patient or authorized representative who has indicated his/her understanding and acceptance.     Plan Discussed with: CRNA, Anesthesiologist and Surgeon  Anesthesia Plan Comments:         Anesthesia Quick Evaluation

## 2014-09-19 NOTE — Op Note (Signed)
Laparoscopic Cholecystectomy with IOC Procedure Note  Indications: This patient presents with symptomatic gallbladder disease and will undergo laparoscopic cholecystectomy.  Pre-operative Diagnosis: Calculus of gallbladder with other cholecystitis, without mention of obstruction  Post-operative Diagnosis: Same  Surgeon: Nephtali Docken K.   Assistants: none  Anesthesia: General endotracheal anesthesia  ASA Class: 2  Procedure Details  The patient was seen again in the Holding Room. The risks, benefits, complications, treatment options, and expected outcomes were discussed with the patient. The possibilities of reaction to medication, pulmonary aspiration, perforation of viscus, bleeding, recurrent infection, finding a normal gallbladder, the need for additional procedures, failure to diagnose a condition, the possible need to convert to an open procedure, and creating a complication requiring transfusion or operation were discussed with the patient. The likelihood of improving the patient's symptoms with return to their baseline status is good.  The patient and/or family concurred with the proposed plan, giving informed consent. The site of surgery properly noted. The patient was taken to Operating Room, identified as [name] and the procedure verified as Laparoscopic Cholecystectomy with Intraoperative Cholangiogram. A Time Out was held and the above information confirmed.  Prior to the induction of general anesthesia, antibiotic prophylaxis was administered. General endotracheal anesthesia was then administered and tolerated well. After the induction, the abdomen was prepped with Chloraprep and draped in the sterile fashion. The patient was positioned in the supine position.  Local anesthetic agent was injected into the skin near the umbilicus and an incision made. We dissected down to the abdominal fascia with blunt dissection.  The fascia was incised vertically and we entered the  peritoneal cavity bluntly.  A pursestring suture of 0-Vicryl was placed around the fascial opening.  The Hasson cannula was inserted and secured with the stay suture.  Pneumoperitoneum was then created with CO2 and tolerated well without any adverse changes in the patient's vital signs. An 11-mm port was placed in the subxiphoid position.  Two 5-mm ports were placed in the right upper quadrant. All skin incisions were infiltrated with a local anesthetic agent before making the incision and placing the trocars.   We positioned the patient in reverse Trendelenburg, tilted slightly to the patient's left.  The gallbladder was identified, the fundus grasped and retracted cephalad. There were significant omental adhesions to the surface of the gallbladder.  Adhesions were lysed bluntly and with the electrocautery where indicated, taking care not to injure any adjacent organs or viscus. The infundibulum was grasped and retracted laterally, exposing the peritoneum overlying the triangle of Calot. This was then divided and exposed in a blunt fashion. A critical view of the cystic duct and cystic artery was obtained.  The cystic duct was clearly identified and bluntly dissected circumferentially. The cystic duct was ligated with a clip distally.   An incision was made in the cystic duct and the [facility] cholangiogram catheter introduced. The catheter was secured using a clip. A cholangiogram was then obtained which showed good visualization of the distal and proximal biliary tree with no sign of filling defects or obstruction.  Contrast flowed easily into the duodenum. The catheter was then removed.   The cystic duct was then ligated with clips and divided. The cystic artery was identified, dissected free, ligated with clips and divided as well.   The gallbladder was dissected from the liver bed in retrograde fashion with the electrocautery. The gallbladder was removed and placed in an Endocatch sac. The liver bed was  irrigated and inspected. Hemostasis was achieved with  the electrocautery. Copious irrigation was utilized and was repeatedly aspirated until clear.  The gallbladder and Endocatch sac were then removed through the umbilical port site.  The pursestring suture was used to close the umbilical fascia.    We again inspected the right upper quadrant for hemostasis.  Pneumoperitoneum was released as we removed the trocars.  4-0 Monocryl was used to close the skin.   Benzoin, steri-strips, and clean dressings were applied. The patient was then extubated and brought to the recovery room in stable condition. Instrument, sponge, and needle counts were correct at closure and at the conclusion of the case.   Findings: Cholecystitis with Cholelithiasis  Estimated Blood Loss: Minimal         Drains: none         Specimens: Gallbladder           Complications: None; patient tolerated the procedure well.         Disposition: PACU - hemodynamically stable.         Condition: stable  [name]. [name] Dover, MD, [facility]  General/ Trauma Surgery  09/19/2014 10:31 AM

## 2014-09-20 ENCOUNTER — Encounter (HOSPITAL_COMMUNITY): Payer: Self-pay | Admitting: Surgery

## 2014-10-07 ENCOUNTER — Other Ambulatory Visit (HOSPITAL_COMMUNITY): Payer: Self-pay | Admitting: Obstetrics and Gynecology

## 2014-10-07 LAB — HM PAP SMEAR: HM Pap smear: NEGATIVE

## 2014-10-08 LAB — CYTOLOGY - PAP

## 2014-10-09 LAB — HM MAMMOGRAPHY

## 2014-10-17 ENCOUNTER — Encounter: Payer: Self-pay | Admitting: Physician Assistant

## 2014-10-30 ENCOUNTER — Encounter: Payer: Self-pay | Admitting: *Deleted

## 2014-11-05 ENCOUNTER — Other Ambulatory Visit: Payer: Self-pay | Admitting: Internal Medicine

## 2014-11-11 ENCOUNTER — Other Ambulatory Visit: Payer: Self-pay | Admitting: Cardiology

## 2014-12-03 ENCOUNTER — Ambulatory Visit (INDEPENDENT_AMBULATORY_CARE_PROVIDER_SITE_OTHER): Payer: BLUE CROSS/BLUE SHIELD | Admitting: Physician Assistant

## 2014-12-03 ENCOUNTER — Encounter: Payer: Self-pay | Admitting: Physician Assistant

## 2014-12-03 VITALS — BP 104/62 | HR 64 | Temp 98.7°F | Resp 16 | Ht 62.0 in | Wt 133.0 lb

## 2014-12-03 DIAGNOSIS — E119 Type 2 diabetes mellitus without complications: Secondary | ICD-10-CM

## 2014-12-03 DIAGNOSIS — J302 Other seasonal allergic rhinitis: Secondary | ICD-10-CM

## 2014-12-03 DIAGNOSIS — I10 Essential (primary) hypertension: Secondary | ICD-10-CM

## 2014-12-03 DIAGNOSIS — F419 Anxiety disorder, unspecified: Secondary | ICD-10-CM

## 2014-12-03 DIAGNOSIS — J309 Allergic rhinitis, unspecified: Secondary | ICD-10-CM

## 2014-12-03 DIAGNOSIS — Z114 Encounter for screening for human immunodeficiency virus [HIV]: Secondary | ICD-10-CM | POA: Diagnosis not present

## 2014-12-03 DIAGNOSIS — F32A Depression, unspecified: Secondary | ICD-10-CM

## 2014-12-03 DIAGNOSIS — J019 Acute sinusitis, unspecified: Secondary | ICD-10-CM | POA: Diagnosis not present

## 2014-12-03 DIAGNOSIS — J3089 Other allergic rhinitis: Secondary | ICD-10-CM

## 2014-12-03 DIAGNOSIS — F329 Major depressive disorder, single episode, unspecified: Secondary | ICD-10-CM

## 2014-12-03 DIAGNOSIS — F418 Other specified anxiety disorders: Secondary | ICD-10-CM

## 2014-12-03 LAB — HIV ANTIBODY (ROUTINE TESTING W REFLEX): HIV 1&2 Ab, 4th Generation: NONREACTIVE

## 2014-12-03 LAB — GLUCOSE, POCT (MANUAL RESULT ENTRY): POC GLUCOSE: 179 mg/dL — AB (ref 70–99)

## 2014-12-03 LAB — POCT GLYCOSYLATED HEMOGLOBIN (HGB A1C): Hemoglobin A1C: 7.7

## 2014-12-03 MED ORDER — FEXOFENADINE HCL 30 MG PO TABS: 30.0000 mg | ORAL_TABLET | Freq: Two times a day (BID) | ORAL | Status: DC

## 2014-12-03 MED ORDER — FLUTICASONE PROPIONATE 50 MCG/ACT NA SUSP: 2.0000 | Freq: Every day | NASAL | Status: DC

## 2014-12-03 MED ORDER — SERTRALINE HCL 50 MG PO TABS: 75.0000 mg | ORAL_TABLET | Freq: Every day | ORAL | Status: DC

## 2014-12-03 MED ORDER — GUAIFENESIN ER 1200 MG PO TB12: 1.0000 | ORAL_TABLET | Freq: Two times a day (BID) | ORAL | Status: DC | PRN

## 2014-12-03 MED ORDER — PREDNISONE 20 MG PO TABS: ORAL_TABLET | ORAL | Status: DC

## 2014-12-03 MED ORDER — FEXOFENADINE HCL 180 MG PO TABS: 180.0000 mg | ORAL_TABLET | Freq: Every day | ORAL | Status: DC

## 2014-12-03 MED ORDER — METFORMIN HCL 500 MG PO TABS: 500.0000 mg | ORAL_TABLET | Freq: Two times a day (BID) | ORAL | Status: DC

## 2014-12-03 NOTE — Patient Instructions (Addendum)
I will contact you with your lab results as soon as they are available.   If you have not heard from me in 2 weeks, please contact me.  The fastest way to get your results is to register for My Chart (see the instructions on the last page of this printout).  Complete the antibiotic for the sinus infection. We need to get a better handle on the mucous, get it thin, so that it can drain out. If you're not having improvement with Allegra (fexofenadine) 180 mg once each day, along with the Mucinex (guiafenisen), we'll need to add Singulair.

## 2014-12-03 NOTE — Progress Notes (Signed)
Patient ID: [name], female    DOB: May 30, 1961, 54 y.o.   MRN: [id number]  PCP: JEFFERY,CHELLE, PA-C   Subjective:  HPI Pt is a 54 y/o female presenting to clinic for f/u for diabetes and sinusitis.  Pt has type II diabetes mellitus that is currently being treated with lifestyle changes alone. She states that she has "been bad lately" as far as her diet goes, as she has been on vacation and eating good amount of ice cream. She does exercise and go on walks daily while at work. She checks her glucose at home, in the morning when she first wakes up, and states that her glucose usually ranges between 130 and 170 mg/dL. She also performs at home foot exams and has seen a diabetes educator. She denies polyphagia, polydipsia, polyuria, and nocturia. She does not remember when her last visit to an ophthalmologist was, but states that she knows that she needs to make an appointment.   Pt also states that she began having sinus pressure/pain, HA, yellow and bloody sinus drainage, and fever 1 week ago. She saw a doctor that is at her workplace on the day that her symptoms first started, was diagnosed with sinusitis, and given augmentin and a 6-day prednisone taper. She has finished the prednisone taper, and has 3 days remaining of the augmentin. The sinus pain and HA have decreased and she no longer has a fever, and her mucous no longer has blood in it. The mucous is now thick and bright yellow. Pt also has a non-productive cough that is worse at night when she experiences postnasal drip. She also has rhinorrhea, a cold sore on her lip, and hoarseness from coughing. Denies fever/chills, sore throat, sneezing, itchy eyes, ear pain, dental problems, and SOB. She has also tried using OTC cough medicine at night to control her cough when she sleeps, with relief.   Review of Systems  Constitutional: Positive for fever (a week ago, not lately) and fatigue. Negative for chills, diaphoresis, activity change,  appetite change and unexpected weight change.  HENT: Positive for congestion, mouth sores (cold sore on lip), postnasal drip, rhinorrhea, sinus pressure and voice change (hoarseness). Negative for dental problem, drooling, ear discharge, ear pain, facial swelling, hearing loss, nosebleeds, sneezing, sore throat, tinnitus and trouble swallowing.   Eyes: Positive for discharge (yellow discharge from sinuses). Negative for photophobia, pain, redness, itching and visual disturbance.  Respiratory: Positive for cough (Worse at night). Negative for apnea, choking, chest tightness, shortness of breath, wheezing and stridor.   Cardiovascular: Negative.   Gastrointestinal: Positive for diarrhea (hx of crohn's). Negative for nausea, vomiting, abdominal pain, constipation, blood in stool, abdominal distention, anal bleeding and rectal pain.  Endocrine: Negative.   Genitourinary: Negative.   Musculoskeletal: Negative.   Skin: Negative.   Allergic/Immunologic: Positive for environmental allergies. Negative for food allergies and immunocompromised state.  Neurological: Positive for headaches (from sinus pressure). Negative for dizziness, syncope, weakness, light-headedness and numbness.     Patient Active Problem List   Diagnosis Date Noted  . CAD (coronary artery disease) 10/10/2011  . Type II or unspecified type diabetes mellitus without mention of complication, not stated as uncontrolled 02/25/2010  . ESSENTIAL HYPERTENSION, BENIGN 08/18/2009  . CHEST PAIN UNSPECIFIED 08/01/2009  . B12 DEFICIENCY 01/06/2009  . Hyperlipidemia 03/16/2007  . ANXIETY 03/16/2007  . DEPRESSION 03/16/2007  . Seasonal and perennial allergic rhinitis 03/16/2007  . GERD 03/16/2007  . CHOLELITHIASIS W/O CHOLECYSTITIS W/O OBST 03/16/2007  . SACROILIITIS NEC  03/16/2007  . FIBROMYALGIA 03/16/2007  . PALPITATIONS 03/16/2007  . Crohn's disease 03/16/2007    Past Medical History  Diagnosis Date  . Vitamin B deficiency   .  Anxiety state, unspecified   . Palpitations   . Depressive disorder, not elsewhere classified   . Other and unspecified hyperlipidemia   . Esophageal reflux   . Calculus of gallbladder without mention of cholecystitis or obstruction   . Fibromyalgia   . Crohn disease   . Myalgia and myositis, unspecified   . Allergic rhinitis due to pollen   . Unspecified essential hypertension   . Diabetes mellitus without complication     diet controlled  . Pneumonia 15    hx  . Bronchitis     hx  . Headache   . Arthritis     Prior to Admission medications   Medication Sig Start Date End Date Taking? Authorizing Provider  albuterol (PROVENTIL HFA;VENTOLIN HFA) 108 (90 BASE) MCG/ACT inhaler Inhale 2 puffs into the lungs every 6 (six) hours as needed for wheezing or shortness of breath.   Yes Historical Provider, MD  albuterol (PROVENTIL) (5 MG/ML) 0.5% nebulizer solution Take 0.5 mLs (2.5 mg total) by nebulization every 6 (six) hours as needed for wheezing or shortness of breath. 11/23/12  Yes Thao P Le, DO  amoxicillin-clavulanate (AUGMENTIN) 875-125 MG per tablet Take 875 tablets by mouth 2 (two) times daily. 11/26/14  Yes Historical Provider, MD  aspirin 81 MG chewable tablet Chew 81 mg by mouth daily.     Yes Historical Provider, MD  atorvastatin (LIPITOR) 40 MG tablet TAKE 1 TABLET BY MOUTH ONCE DAILY 11/12/14  Yes Larey Dresser, MD  celecoxib (CELEBREX) 200 MG capsule Take 1 capsule (200 mg total) by mouth 2 (two) times daily. 08/09/14  Yes Chelle Jeffery, PA-C  cyanocobalamin (,VITAMIN B-12,) 1000 MCG/ML injection Inject 1 mL (1,000 mcg total) into the muscle every 30 (thirty) days. 11/17/10  Yes Lafayette Dragon, MD  cyclobenzaprine (FLEXERIL) 10 MG tablet Take 0.5-1 tablets (5-10 mg total) by mouth 3 (three) times daily as needed for muscle spasms. 01/11/13  Yes Chelle Jeffery, PA-C  enalapril (VASOTEC) 5 MG tablet Take 1 tablet (5 mg total) by mouth daily. 06/24/14  Yes Larey Dresser, MD    mercaptopurine (PURINETHOL) 50 MG tablet TAKE 1 TABLET BY MOUTH DAILY 09/02/14  Yes Lafayette Dragon, MD  NEXIUM 40 MG capsule TAKE 1 CAPSULE BY MOUTH ONCE DAILY 11/05/14  Yes Lafayette Dragon, MD  NON FORMULARY musinex   Yes Historical Provider, MD  sertraline (ZOLOFT) 50 MG tablet Take 1.5 tablets (75 mg total) by mouth daily. 02/06/14  Yes Chelle Jeffery, PA-C  zolpidem (AMBIEN) 10 MG tablet Take 10 mg by mouth at bedtime as needed for sleep.    Yes Historical Provider, MD  ALPRAZolam Duanne Moron) 0.5 MG tablet Take 0.5 mg by mouth at bedtime as needed for anxiety.    Historical Provider, MD  EPIPEN 2-PAK 0.3 MG/0.3ML SOAJ injection  10/09/14   Historical Provider, MD    No Known Allergies  Past Medical, Surgical Family and Social History reviewed and updated.   Objective:   Vitals: BP 104/62 mmHg  Pulse 64  Temp(Src) 98.7 F (37.1 C) (Oral)  Resp 16  Ht 5' 2"  (1.575 m)  Wt 133 lb (60.328 kg)  BMI 24.32 kg/m2  SpO2 98%   Physical Exam  Constitutional: She is oriented to person, place, and time. She appears well-developed and well-nourished. She is  cooperative. No distress.  HENT:  Head: Normocephalic and atraumatic.  Right Ear: Hearing, tympanic membrane, external ear and ear canal normal.  Left Ear: Hearing, tympanic membrane, external ear and ear canal normal.  Nose: Nose normal. Right sinus exhibits no maxillary sinus tenderness (Pressure, no pain) and no frontal sinus tenderness (Pressure, no pain). Left sinus exhibits no maxillary sinus tenderness (Pressure, no pain) and no frontal sinus tenderness (Pressure, no pain).  Mouth/Throat: Uvula is midline and mucous membranes are normal. Posterior oropharyngeal erythema (Mildl) present. No oropharyngeal exudate, posterior oropharyngeal edema or tonsillar abscesses.  Eyes: Conjunctivae, EOM and lids are normal. Pupils are equal, round, and reactive to light. Right eye exhibits no discharge. Left eye exhibits no discharge.  Fundoscopic exam:       The right eye shows no exudate, no hemorrhage and no papilledema. The right eye shows red reflex.       The left eye shows no exudate, no hemorrhage and no papilledema. The left eye shows red reflex.  Neck: Trachea normal. No tracheal tenderness present. No thyroid mass and no thyromegaly present.  Cardiovascular: Normal rate, regular rhythm, normal heart sounds and intact distal pulses.  Exam reveals no gallop and no friction rub.   No murmur heard. Pulmonary/Chest: Effort normal and breath sounds normal. No respiratory distress. She has no wheezes. She has no rales.  Lymphadenopathy:       Head (right side): No submental, no submandibular, no tonsillar, no preauricular, no posterior auricular and no occipital adenopathy present.       Head (left side): No submental, no submandibular, no tonsillar, no preauricular, no posterior auricular and no occipital adenopathy present.    She has no cervical adenopathy.       Right: No supraclavicular adenopathy present.       Left: No supraclavicular adenopathy present.  Neurological: She is alert and oriented to person, place, and time. She has normal strength. No sensory deficit.  Skin: Skin is warm, dry and intact. No rash noted.  Psychiatric: She has a normal mood and affect. Her speech is normal and behavior is normal. Thought content normal.    Results for orders placed or performed in visit on 12/03/14  POCT glucose (manual entry)  Result Value Ref Range   POC Glucose 179 (A) 70 - 99 mg/dl  POCT glycosylated hemoglobin (Hb A1C)  Result Value Ref Range   Hemoglobin A1C 7.7       Assessment & Plan:   Dierdre was seen today for diabetes and sinusitis.  Diagnoses and all orders for this visit:  Diabetes mellitus type 2, uncomplicated -    Glucose and HbA1c to evaluate control - both elevated (pt fasting today). -     Other labs and foot exam last done 01/2014. Will perform these at her next visit.  -    Plan to start pt on metformin today  for better control. -    Encouraged to continue exercising and to try to work on improving her diet.  Orders: -     POCT glucose (manual entry) -     POCT glycosylated hemoglobin (Hb A1C) -     metFORMIN (GLUCOPHAGE) 500 MG tablet; Take 1 tablet (500 mg total) by mouth 2 (two) times daily with a meal.  Essential hypertension, benign       -     Controlled. Continue current regimen.  Seasonal and perennial allergic rhinitis -     Try oral antihistamine. -  Pt does not want to try an intranasal steroid, she says that they cause epistaxis.  Orders: -     fexofenadine (ALLEGRA) 180 MG tablet; Take 1 tablet (180 mg total) by mouth daily.  Acute sinusitis, recurrence not specified, unspecified location -     Improving on Augmentin, complete course. -     Add Mucinex to help thin secretions and get mucous out. -     Do another Prednisone taper to help calm down inflammation and relieve symptoms, plan to do a 9-day taper. Orders: -     Guaifenesin (MUCINEX MAXIMUM STRENGTH) 1200 MG TB12; Take 1 tablet (1,200 mg total) by mouth every 12 (twelve) hours as needed. -     predniSONE (DELTASONE) 20 MG tablet; Take 3 PO QAM x3days, 2 PO QAM x3days, 1 PO QAM x3days  Screening for HIV (human immunodeficiency virus) -     Await lab results. Orders: -     HIV antibody  Anxiety and depression -      Well controlled, tolerating medication well. Needs a refill. Orders: -     sertraline (ZOLOFT) 50 MG tablet; Take 1.5 tablets (75 mg total) by mouth daily.  Phinneas Shakoor, PA-S [facility] 12/03/2014 8:20 AM

## 2014-12-03 NOTE — Progress Notes (Signed)
Patient ID: [name], female    DOB: 03-Sep-1960, 54 y.o.   MRN: [id number]  PCP: Wynne Dust  Subjective:   Chief Complaint  Patient presents with  . Diabetes  . Sinusitis    yellow bloody drainage    HPI Presents for evaluation of diabetes. In addition, she has a lot of sinus pressure and congestion on the RIGHT side.  Pt has type II diabetes mellitus that is currently being treated with lifestyle changes alone. She states that she has "been bad lately" as far as her diet goes, as she has been on vacation and eating good amount of ice cream. She does exercise and go on walks daily while at work. She checks her glucose at home, in the morning when she first wakes up, and states that her glucose usually ranges between 130 and 170 mg/dL. She also performs at home foot exams and has seen a diabetes educator. She denies polyphagia, polydipsia, polyuria, and nocturia. She does not remember when her last visit to an ophthalmologist was, but states that she knows that she needs to make an appointment.   Pt also states that she began having sinus pressure/pain, HA, yellow and bloody sinus drainage, and fever 1 week ago. She saw a doctor that is at her workplace on the day that her symptoms first started, was diagnosed with sinusitis, and given augmentin and a 6-day prednisone taper. She has finished the prednisone taper, and has 3 days remaining of the augmentin. The sinus pain and HA have decreased and she no longer has a fever, and her mucous no longer has blood in it. The mucous is now thick and bright yellow. Pt also has a non-productive cough that is worse at night when she experiences postnasal drip. She also has rhinorrhea, a cold sore on her lip, and hoarseness from coughing. Denies fever/chills, sore throat, sneezing, itchy eyes, ear pain, dental problems, and SOB. She has also tried using OTC cough medicine at night to control her cough when she sleeps, with  relief. .    Review of Systems  Constitutional: Positive for fatigue. Negative for fever, chills, diaphoresis, activity change, appetite change and unexpected weight change (weight loss is intentional).  HENT: Positive for congestion, postnasal drip, rhinorrhea, sinus pressure and voice change (laryngitis). Negative for ear pain, facial swelling and nosebleeds.   Eyes: Negative for visual disturbance.  Respiratory: Positive for cough (worse at night). Negative for shortness of breath and wheezing.   Cardiovascular: Negative for chest pain, palpitations and leg swelling.  Gastrointestinal: Negative.   Endocrine: Negative for cold intolerance, heat intolerance, polydipsia, polyphagia and polyuria.  Genitourinary: Negative for dysuria, urgency and frequency.  Allergic/Immunologic: Positive for environmental allergies. Negative for food allergies and immunocompromised state.  Neurological: Negative for dizziness, weakness, light-headedness and headaches.  Psychiatric/Behavioral: Negative for sleep disturbance and self-injury. The patient is not nervous/anxious.        Patient Active Problem List   Diagnosis Date Noted  . CAD (coronary artery disease) 10/10/2011  . Diabetes mellitus type 2, uncomplicated 19/62/2297  . ESSENTIAL HYPERTENSION, BENIGN 08/18/2009  . CHEST PAIN UNSPECIFIED 08/01/2009  . B12 DEFICIENCY 01/06/2009  . Hyperlipidemia 03/16/2007  . ANXIETY 03/16/2007  . DEPRESSION 03/16/2007  . Seasonal and perennial allergic rhinitis 03/16/2007  . GERD 03/16/2007  . CHOLELITHIASIS W/O CHOLECYSTITIS W/O OBST 03/16/2007  . SACROILIITIS NEC 03/16/2007  . FIBROMYALGIA 03/16/2007  . PALPITATIONS 03/16/2007  . Crohn's disease 03/16/2007     Prior to Admission medications  Medication Sig Start Date End Date Taking? Authorizing Provider  albuterol (PROVENTIL HFA;VENTOLIN HFA) 108 (90 BASE) MCG/ACT inhaler Inhale 2 puffs into the lungs every 6 (six) hours as needed for wheezing  or shortness of breath.   Yes Historical Provider, MD  albuterol (PROVENTIL) (5 MG/ML) 0.5% nebulizer solution Take 0.5 mLs (2.5 mg total) by nebulization every 6 (six) hours as needed for wheezing or shortness of breath. 11/23/12  Yes Thao P Le, DO  amoxicillin-clavulanate (AUGMENTIN) 875-125 MG per tablet Take 875 tablets by mouth 2 (two) times daily. 11/26/14  Yes Historical Provider, MD  aspirin 81 MG chewable tablet Chew 81 mg by mouth daily.     Yes Historical Provider, MD  atorvastatin (LIPITOR) 40 MG tablet TAKE 1 TABLET BY MOUTH ONCE DAILY 11/12/14  Yes Larey Dresser, MD  celecoxib (CELEBREX) 200 MG capsule Take 1 capsule (200 mg total) by mouth 2 (two) times daily. 08/09/14  Yes Marianny Goris, PA-C  cyanocobalamin (,VITAMIN B-12,) 1000 MCG/ML injection Inject 1 mL (1,000 mcg total) into the muscle every 30 (thirty) days. 11/17/10  Yes Lafayette Dragon, MD  cyclobenzaprine (FLEXERIL) 10 MG tablet Take 0.5-1 tablets (5-10 mg total) by mouth 3 (three) times daily as needed for muscle spasms. 01/11/13  Yes Sommer Spickard, PA-C  enalapril (VASOTEC) 5 MG tablet Take 1 tablet (5 mg total) by mouth daily. 06/24/14  Yes Larey Dresser, MD  mercaptopurine (PURINETHOL) 50 MG tablet TAKE 1 TABLET BY MOUTH DAILY 09/02/14  Yes Lafayette Dragon, MD  NEXIUM 40 MG capsule TAKE 1 CAPSULE BY MOUTH ONCE DAILY 11/05/14  Yes Lafayette Dragon, MD  sertraline (ZOLOFT) 50 MG tablet Take 1.5 tablets (75 mg total) by mouth daily. 02/06/14  Yes Kooper Chriswell, PA-C  zolpidem (AMBIEN) 10 MG tablet Take 10 mg by mouth at bedtime as needed for sleep.    Yes Historical Provider, MD  ALPRAZolam Duanne Moron) 0.5 MG tablet Take 0.5 mg by mouth at bedtime as needed for anxiety.    Historical Provider, MD  EPIPEN 2-PAK 0.3 MG/0.3ML SOAJ injection  10/09/14   Historical Provider, MD     No Known Allergies     Objective:  Physical Exam  Constitutional: She is oriented to person, place, and time. Vital signs are normal. She appears well-developed  and well-nourished. She is active and cooperative. No distress.  BP 104/62 mmHg  Pulse 64  Temp(Src) 98.7 F (37.1 C) (Oral)  Resp 16  Ht 5' 2"  (1.575 m)  Wt 133 lb (60.328 kg)  BMI 24.32 kg/m2  SpO2 98%  HENT:  Head: Normocephalic and atraumatic.  Right Ear: Hearing normal.  Left Ear: Hearing normal.  Eyes: Conjunctivae are normal. No scleral icterus.  Neck: Normal range of motion. Neck supple. No thyromegaly present.  Cardiovascular: Normal rate, regular rhythm and normal heart sounds.   Pulses:      Radial pulses are 2+ on the right side, and 2+ on the left side.       Dorsalis pedis pulses are 2+ on the right side, and 2+ on the left side.       Posterior tibial pulses are 2+ on the right side, and 2+ on the left side.  Pulmonary/Chest: Effort normal and breath sounds normal.  Lymphadenopathy:       Head (right side): No tonsillar, no preauricular, no posterior auricular and no occipital adenopathy present.       Head (left side): No tonsillar, no preauricular, no posterior auricular and no  occipital adenopathy present.    She has no cervical adenopathy.       Right: No supraclavicular adenopathy present.       Left: No supraclavicular adenopathy present.  Neurological: She is alert and oriented to person, place, and time. No sensory deficit.  Skin: Skin is warm, dry and intact. No rash noted. No cyanosis or erythema. Nails show no clubbing.  Psychiatric: She has a normal mood and affect. Her speech is normal and behavior is normal.           Assessment & Plan:   1. Diabetes mellitus type 2, uncomplicated Less well controlled. Above goal. Start metformin. Resume healthy eating habits. Continue exercise. Recheck in 12 weeks. - POCT glucose (manual entry) - POCT glycosylated hemoglobin (Hb A1C) - metFORMIN (GLUCOPHAGE) 500 MG tablet; Take 1 tablet (500 mg total) by mouth 2 (two) times daily with a meal.  Dispense: 180 tablet; Refill: 3  2. Essential hypertension,  benign Controlled. Continue current treatment.  3. Seasonal and perennial allergic rhinitis Likely contributing to sinusitis. Recommend maintenance treatment. She is intolerant to nasal sprays, which cause epistaxis. Consider addition of Singulair. - fexofenadine (ALLEGRA) 180 MG tablet; Take 1 tablet (180 mg total) by mouth daily.  Dispense: 90 tablet; Refill: 3  4. Acute sinusitis, recurrence not specified, unspecified location Improving on antibiotics, but congestion remains and needs improved drainage. Guaifenesin and oral prednisone. Anticipatory guidance regarding prednisone in diabetes. - Guaifenesin (MUCINEX MAXIMUM STRENGTH) 1200 MG TB12; Take 1 tablet (1,200 mg total) by mouth every 12 (twelve) hours as needed.  Dispense: 14 tablet; Refill: 1 - predniSONE (DELTASONE) 20 MG tablet; Take 3 PO QAM x3days, 2 PO QAM x3days, 1 PO QAM x3days  Dispense: 18 tablet; Refill: 0  5. Screening for HIV (human immunodeficiency virus) - HIV antibody  6. Anxiety and depression Stable/controlled. Continue sertraline. - sertraline (ZOLOFT) 50 MG tablet; Take 1.5 tablets (75 mg total) by mouth daily.  Dispense: 135 tablet; Refill: 5   Fara Chute, PA-C Physician Assistant-Certified [facility] .

## 2014-12-04 ENCOUNTER — Encounter: Payer: Self-pay | Admitting: Physician Assistant

## 2014-12-04 ENCOUNTER — Other Ambulatory Visit: Payer: Self-pay | Admitting: Internal Medicine

## 2014-12-20 ENCOUNTER — Encounter: Payer: Self-pay | Admitting: Internal Medicine

## 2014-12-23 ENCOUNTER — Telehealth: Payer: Self-pay | Admitting: Internal Medicine

## 2014-12-23 ENCOUNTER — Other Ambulatory Visit (INDEPENDENT_AMBULATORY_CARE_PROVIDER_SITE_OTHER): Payer: BLUE CROSS/BLUE SHIELD

## 2014-12-23 ENCOUNTER — Other Ambulatory Visit: Payer: Self-pay

## 2014-12-23 DIAGNOSIS — R197 Diarrhea, unspecified: Secondary | ICD-10-CM

## 2014-12-23 DIAGNOSIS — R945 Abnormal results of liver function studies: Secondary | ICD-10-CM

## 2014-12-23 DIAGNOSIS — R7989 Other specified abnormal findings of blood chemistry: Secondary | ICD-10-CM

## 2014-12-23 LAB — HEPATIC FUNCTION PANEL
ALT: 42 U/L — ABNORMAL HIGH (ref 0–35)
AST: 21 U/L (ref 0–37)
Albumin: 4 g/dL (ref 3.5–5.2)
Alkaline Phosphatase: 80 U/L (ref 39–117)
Bilirubin, Direct: 0.1 mg/dL (ref 0.0–0.3)
TOTAL PROTEIN: 6 g/dL (ref 6.0–8.3)
Total Bilirubin: 0.7 mg/dL (ref 0.2–1.2)

## 2014-12-23 LAB — CBC WITH DIFFERENTIAL/PLATELET
BASOS PCT: 0.4 % (ref 0.0–3.0)
Basophils Absolute: 0 10*3/uL (ref 0.0–0.1)
Eosinophils Absolute: 0.2 10*3/uL (ref 0.0–0.7)
Eosinophils Relative: 1.8 % (ref 0.0–5.0)
HCT: 38.2 % (ref 36.0–46.0)
Hemoglobin: 13 g/dL (ref 12.0–15.0)
Lymphocytes Relative: 21.9 % (ref 12.0–46.0)
Lymphs Abs: 1.8 10*3/uL (ref 0.7–4.0)
MCHC: 34.2 g/dL (ref 30.0–36.0)
MCV: 92 fl (ref 78.0–100.0)
Monocytes Absolute: 0.5 10*3/uL (ref 0.1–1.0)
Monocytes Relative: 5.8 % (ref 3.0–12.0)
Neutro Abs: 5.7 10*3/uL (ref 1.4–7.7)
Neutrophils Relative %: 70.1 % (ref 43.0–77.0)
PLATELETS: 243 10*3/uL (ref 150.0–400.0)
RBC: 4.15 Mil/uL (ref 3.87–5.11)
RDW: 13.9 % (ref 11.5–15.5)
WBC: 8.1 10*3/uL (ref 4.0–10.5)

## 2014-12-23 LAB — COMPREHENSIVE METABOLIC PANEL
ALBUMIN: 4 g/dL (ref 3.5–5.2)
ALT: 42 U/L — ABNORMAL HIGH (ref 0–35)
AST: 21 U/L (ref 0–37)
Alkaline Phosphatase: 80 U/L (ref 39–117)
BUN: 25 mg/dL — AB (ref 6–23)
CALCIUM: 9.4 mg/dL (ref 8.4–10.5)
CHLORIDE: 103 meq/L (ref 96–112)
CO2: 27 mEq/L (ref 19–32)
CREATININE: 0.63 mg/dL (ref 0.40–1.20)
GFR: 103.7 mL/min (ref >60.00)
Glucose, Bld: 142 mg/dL — ABNORMAL HIGH (ref 70–99)
Potassium: 4.3 mEq/L (ref 3.5–5.1)
Sodium: 138 mEq/L (ref 135–145)
Total Bilirubin: 0.7 mg/dL (ref 0.2–1.2)
Total Protein: 6 g/dL (ref 6.0–8.3)

## 2014-12-23 LAB — SEDIMENTATION RATE: Sed Rate: 18 mm/hr (ref 0–22)

## 2014-12-23 MED ORDER — COLESTIPOL HCL 1 G PO TABS: 1.0000 g | ORAL_TABLET | Freq: Two times a day (BID) | ORAL | Status: DC

## 2014-12-23 NOTE — Telephone Encounter (Signed)
Had her cholecystectomy in April. Her complaint today is her need to have a bowel movement after eating. She doesn't think she is having a flare of the Crohn's. Should I bring her in to see an extender?

## 2014-12-23 NOTE — Telephone Encounter (Signed)
Please try first Colestid 1gm, 2 tabs po qd, #30, and have her come for CBC,C-met, sed.rate

## 2014-12-23 NOTE — Telephone Encounter (Signed)
Patient aware of the plan.

## 2014-12-28 ENCOUNTER — Telehealth: Payer: Self-pay | Admitting: Family Medicine

## 2014-12-28 NOTE — Telephone Encounter (Signed)
lmom to call and reschedule her appt with Chelle in Oct

## 2015-01-07 ENCOUNTER — Ambulatory Visit: Payer: BLUE CROSS/BLUE SHIELD | Admitting: Internal Medicine

## 2015-01-13 ENCOUNTER — Telehealth: Payer: Self-pay | Admitting: *Deleted

## 2015-01-13 NOTE — Telephone Encounter (Signed)
Faxed most recent mammo request to ob/gyn's office.  Will update once received.

## 2015-01-16 NOTE — Telephone Encounter (Signed)
Received faxed mammo report dated 10/09/14 - no mammographic evidence of malignancy.  Updated health maintenance, abstracted, and sent to PCP for review (still needs to be sent to med rec for scanning).

## 2015-02-10 ENCOUNTER — Other Ambulatory Visit: Payer: Self-pay | Admitting: Physician Assistant

## 2015-03-06 ENCOUNTER — Ambulatory Visit: Payer: BLUE CROSS/BLUE SHIELD | Admitting: Physician Assistant

## 2015-03-10 ENCOUNTER — Telehealth: Payer: Self-pay | Admitting: Internal Medicine

## 2015-03-10 DIAGNOSIS — K50919 Crohn's disease, unspecified, with unspecified complications: Secondary | ICD-10-CM

## 2015-03-11 NOTE — Telephone Encounter (Signed)
[name] patient, called requesting a refill on mercaptopurine stating that she is almost out of medication, needs enough until appointment. Patient was scheduled for an appointment with you 05/08/15 at 10:30. Please advise as to refills.

## 2015-03-12 MED ORDER — MERCAPTOPURINE 50 MG PO TABS: 50.0000 mg | ORAL_TABLET | Freq: Every day | ORAL | Status: DC

## 2015-03-12 NOTE — Telephone Encounter (Signed)
Ok to refill, please have patient do labs (CBC, BMP, LFT and folate). Thanks

## 2015-03-12 NOTE — Telephone Encounter (Signed)
Patient informed per Dr. Silverio Decamp to come in for labs. Labs ordered. Medication refilled.

## 2015-03-17 ENCOUNTER — Other Ambulatory Visit: Payer: BLUE CROSS/BLUE SHIELD

## 2015-03-17 ENCOUNTER — Other Ambulatory Visit (INDEPENDENT_AMBULATORY_CARE_PROVIDER_SITE_OTHER): Payer: BLUE CROSS/BLUE SHIELD

## 2015-03-17 DIAGNOSIS — K50919 Crohn's disease, unspecified, with unspecified complications: Secondary | ICD-10-CM

## 2015-03-17 LAB — CBC WITH DIFFERENTIAL/PLATELET
BASOS PCT: 0.4 % (ref 0.0–3.0)
Basophils Absolute: 0 10*3/uL (ref 0.0–0.1)
Eosinophils Absolute: 0.2 10*3/uL (ref 0.0–0.7)
Eosinophils Relative: 1.9 % (ref 0.0–5.0)
HEMATOCRIT: 39.9 % (ref 36.0–46.0)
Hemoglobin: 13.4 g/dL (ref 12.0–15.0)
LYMPHS PCT: 18.4 % (ref 12.0–46.0)
Lymphs Abs: 1.6 10*3/uL (ref 0.7–4.0)
MCHC: 33.5 g/dL (ref 30.0–36.0)
MCV: 94.1 fl (ref 78.0–100.0)
MONOS PCT: 6.6 % (ref 3.0–12.0)
Monocytes Absolute: 0.6 10*3/uL (ref 0.1–1.0)
NEUTROS ABS: 6.3 10*3/uL (ref 1.4–7.7)
Neutrophils Relative %: 72.7 % (ref 43.0–77.0)
PLATELETS: 239 10*3/uL (ref 150.0–400.0)
RBC: 4.23 Mil/uL (ref 3.87–5.11)
RDW: 13.5 % (ref 11.5–15.5)
WBC: 8.7 10*3/uL (ref 4.0–10.5)

## 2015-03-18 ENCOUNTER — Other Ambulatory Visit: Payer: Self-pay

## 2015-03-18 DIAGNOSIS — Z7960 Long term (current) use of unspecified immunomodulators and immunosuppressants: Secondary | ICD-10-CM

## 2015-03-18 DIAGNOSIS — Z79899 Other long term (current) drug therapy: Secondary | ICD-10-CM

## 2015-03-18 LAB — HEPATIC FUNCTION PANEL
ALBUMIN: 3.8 g/dL (ref 3.5–5.2)
ALT: 70 U/L — ABNORMAL HIGH (ref 0–35)
AST: 34 U/L (ref 0–37)
Alkaline Phosphatase: 94 U/L (ref 39–117)
Bilirubin, Direct: 0.1 mg/dL (ref 0.0–0.3)
Total Bilirubin: 0.4 mg/dL (ref 0.2–1.2)
Total Protein: 6.1 g/dL (ref 6.0–8.3)

## 2015-03-18 LAB — BASIC METABOLIC PANEL
BUN: 17 mg/dL (ref 6–23)
CALCIUM: 9.4 mg/dL (ref 8.4–10.5)
CO2: 28 mEq/L (ref 19–32)
CREATININE: 1.03 mg/dL (ref 0.40–1.20)
Chloride: 104 mEq/L (ref 96–112)
GFR: 58.75 mL/min — AB (ref >60.00)
Glucose, Bld: 223 mg/dL — ABNORMAL HIGH (ref 70–99)
Potassium: 4.2 mEq/L (ref 3.5–5.1)
Sodium: 139 mEq/L (ref 135–145)

## 2015-03-18 LAB — FOLATE: FOLATE: 14.2 ng/mL (ref >5.9)

## 2015-04-02 ENCOUNTER — Ambulatory Visit (INDEPENDENT_AMBULATORY_CARE_PROVIDER_SITE_OTHER): Payer: BLUE CROSS/BLUE SHIELD | Admitting: Family Medicine

## 2015-04-02 ENCOUNTER — Ambulatory Visit (INDEPENDENT_AMBULATORY_CARE_PROVIDER_SITE_OTHER): Payer: BLUE CROSS/BLUE SHIELD

## 2015-04-02 VITALS — BP 120/70 | HR 72 | Temp 98.5°F | Resp 20 | Ht 63.0 in | Wt 138.4 lb

## 2015-04-02 DIAGNOSIS — M545 Low back pain, unspecified: Secondary | ICD-10-CM

## 2015-04-02 LAB — POCT URINALYSIS DIP (MANUAL ENTRY)
Bilirubin, UA: NEGATIVE
Glucose, UA: 100 — AB
Ketones, POC UA: NEGATIVE
Leukocytes, UA: NEGATIVE
Nitrite, UA: NEGATIVE
Protein Ur, POC: NEGATIVE
Spec Grav, UA: 1.025
Urobilinogen, UA: 0.2
pH, UA: 5.5

## 2015-04-02 MED ORDER — HYDROCODONE-ACETAMINOPHEN 5-325 MG PO TABS: 1.0000 | ORAL_TABLET | Freq: Four times a day (QID) | ORAL | Status: DC | PRN

## 2015-04-02 MED ORDER — PREDNISONE 20 MG PO TABS: ORAL_TABLET | ORAL | Status: DC

## 2015-04-02 NOTE — Progress Notes (Signed)
This chart was scribed for Robyn Haber, MD by Moises Blood, medical scribe at [facility].The patient was seen in exam room 3 and the patient's care was started at 2:46 PM.  Patient ID: [name] MRN: [id number], DOB: [birth year], 54 y.o. Date of Encounter: 04/02/2015  Primary Physician: Wynne Dust  Chief Complaint:  Chief Complaint  Patient presents with   Back Pain    low back pain, yesterday     HPI:  [name] is a 54 y.o. female who presents to [facility] complaining of lower back pain starting yesterday.  She noted that movement makes the back pain worse. She had some stomach pain earlier today. She had cholecystectomy in the past. She denies any injuries or new activities. She denies hurting enough to go to sleep. Her job requires her to climb in and out of cars often.   Her sugars have been okay. She did mention that her sugar was 150s the other day.   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   Past Medical History  Diagnosis Date   Vitamin B deficiency    Anxiety state, unspecified    Palpitations    Depressive disorder, not elsewhere classified    Other and unspecified hyperlipidemia    Esophageal reflux    Calculus of gallbladder without mention of cholecystitis or obstruction    Fibromyalgia    Crohn disease ([place])    Myalgia and myositis, unspecified    Allergic rhinitis due to pollen    Unspecified essential hypertension    Diabetes mellitus without complication (HCC)     diet controlled   Pneumonia 15    hx   Bronchitis     hx   Headache    Arthritis      Home Meds: Prior to Admission medications   Medication Sig Start Date End Date Taking? Authorizing Provider  albuterol (PROVENTIL HFA;VENTOLIN HFA) 108 (90 BASE) MCG/ACT inhaler Inhale 2 puffs into the lungs every 6 (six) hours as needed for wheezing or shortness of breath.    Historical Provider, MD    albuterol (PROVENTIL) (5 MG/ML) 0.5% nebulizer solution Take 0.5 mLs (2.5 mg total) by nebulization every 6 (six) hours as needed for wheezing or shortness of breath. 11/23/12   Thao P Le, DO  ALPRAZolam (XANAX) 0.5 MG tablet Take 0.5 mg by mouth at bedtime as needed for anxiety.    Historical Provider, MD  amoxicillin-clavulanate (AUGMENTIN) 875-125 MG per tablet Take 875 tablets by mouth 2 (two) times daily. 11/26/14   Historical Provider, MD  aspirin 81 MG chewable tablet Chew 81 mg by mouth daily.      Historical Provider, MD  atorvastatin (LIPITOR) 40 MG tablet TAKE 1 TABLET BY MOUTH ONCE DAILY 11/12/14   Larey Dresser, MD  celecoxib (CELEBREX) 200 MG capsule TAKE ONE CAPSULE BY MOUTH TWICE A DAY 02/12/15   Chelle Jeffery, PA-C  colestipol (COLESTID) 1 G tablet Take 1 tablet (1 g total) by mouth 2 (two) times daily. 12/23/14   Lafayette Dragon, MD  cyanocobalamin (,VITAMIN B-12,) 1000 MCG/ML injection Inject 1 mL (1,000 mcg total) into the muscle every 30 (thirty) days. 11/17/10   Lafayette Dragon, MD  cyclobenzaprine (FLEXERIL) 10 MG tablet Take 0.5-1 tablets (5-10 mg total) by mouth 3 (three) times daily as needed for muscle spasms. 01/11/13   Chelle Jeffery, PA-C  enalapril (VASOTEC) 5 MG tablet Take 1 tablet (5  mg total) by mouth daily. 06/24/14   Larey Dresser, MD  EPIPEN 2-PAK 0.3 MG/0.3ML SOAJ injection  10/09/14   Historical Provider, MD  fexofenadine (ALLEGRA) 180 MG tablet Take 1 tablet (180 mg total) by mouth daily. 12/03/14   Chelle Jeffery, PA-C  Guaifenesin (MUCINEX MAXIMUM STRENGTH) 1200 MG TB12 Take 1 tablet (1,200 mg total) by mouth every 12 (twelve) hours as needed. 12/03/14   Chelle Jeffery, PA-C  mercaptopurine (PURINETHOL) 50 MG tablet Take 1 tablet (50 mg total) by mouth daily. Give on an empty stomach 1 hour before or 2 hours after meals. Caution: Chemotherapy. 03/12/15   Mauri Pole, MD  metFORMIN (GLUCOPHAGE) 500 MG tablet Take 1 tablet (500 mg total) by mouth 2 (two) times  daily with a meal. 12/03/14   Chelle Jeffery, PA-C  NEXIUM 40 MG capsule TAKE 1 CAPSULE BY MOUTH DAILY 12/24/14   Lafayette Dragon, MD  NON FORMULARY musinex    Historical Provider, MD  predniSONE (DELTASONE) 20 MG tablet Take 3 PO QAM x3days, 2 PO QAM x3days, 1 PO QAM x3days 12/03/14   Chelle Jeffery, PA-C  sertraline (ZOLOFT) 50 MG tablet Take 1.5 tablets (75 mg total) by mouth daily. 12/03/14   Chelle Jeffery, PA-C  zolpidem (AMBIEN) 10 MG tablet Take 10 mg by mouth at bedtime as needed for sleep.     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 [employer]   Social History Main Topics   Smoking status: Former Smoker -- 1.00 packs/day for 35 years    Types: Cigarettes    Quit date: 05/31/2004   Smokeless tobacco: Never Used   Alcohol Use: No   Drug Use: No   Sexual Activity: No     Comment: married   Other Topics Concern   Not on file   Social History Narrative   Lives with her husband and their pets. Her children are adults and live independently.     Review of Systems: Constitutional: negative for fever, chills, night sweats, weight changes, or fatigue  HEENT: negative for vision changes, hearing loss, congestion, rhinorrhea, ST, epistaxis, or sinus pressure Cardiovascular: negative for chest pain or palpitations Respiratory: negative for hemoptysis, wheezing, shortness of breath, or cough Abdominal: negative for abdominal pain, nausea, vomiting, diarrhea, or constipation Dermatological: negative for rash Neurologic: negative for headache, dizziness, or syncope Musc: positive for back pain (lower) All other systems reviewed and are otherwise negative with the exception to those above and in the HPI.  Physical Exam: Blood  pressure 120/70, pulse 72, temperature 98.5 F (36.9 C), temperature source Oral, resp. rate 20, height 5' 3"  (1.6 m), weight 138 lb 6.4 oz (62.778 kg), SpO2 98 %., Body mass index is 24.52 kg/(m^2). General: Well developed, well nourished, in no acute distress. Head: Normocephalic, atraumatic, eyes without discharge, sclera non-icteric, nares are without discharge. Bilateral auditory canals clear, TM's are without perforation, pearly grey and translucent with reflective cone of light bilaterally. Oral cavity moist, posterior pharynx without exudate, erythema, peritonsillar abscess, or post nasal drip.  Neck: Supple. No thyromegaly. Full ROM. No lymphadenopathy. Lungs: Clear bilaterally to auscultation without wheezes, rales, or rhonchi. Breathing is unlabored. Heart: RRR with S1 S2. No murmurs, rubs, or  gallops appreciated. Abdomen: Soft, non-tender, non-distended with normoactive bowel sounds. No hepatomegaly. No rebound/guarding. No obvious abdominal masses; no cva tenderness Msk:  Strength and tone normal for age. Negative straight leg raising; normal rotation of hip; tenderness in lower lumbar spine Extremities/Skin: Warm and dry. No clubbing or cyanosis. No edema. No rashes or suspicious lesions. Neuro: Alert and oriented X 3. Moves all extremities spontaneously. Gait is normal. CNII-XII grossly in tact. Psych:  Responds to questions appropriately with a normal affect.   [facility] reading (PRIMARY) by Dr. Joseph Art : xray lumbar spine: appears to have arthritic changes along right L4-L5 facet joint and calcified aorta     ASSESSMENT AND PLAN:  54 y.o. year old female with low back pain, calcified abdominal aorta, and facet joint arthritis of lumbar spine This chart was scribed in my presence and reviewed by me personally.    ICD-9-CM ICD-10-CM   1. Midline low back pain without sciatica 724.2 M54.5 POCT urinalysis dipstick     DG Lumbar Spine 2-3 Views     HYDROcodone-acetaminophen ([place])  5-325 MG tablet     predniSONE (DELTASONE) 20 MG tablet     Ambulatory referral to Orthopedic Surgery    By signing my name below, I, Moises Blood, attest that this documentation has been prepared under the direction and in the presence of Robyn Haber, MD. Electronically Signed: Moises Blood, Scribe. 04/02/2015 , 2:46 PM .  Signed, Robyn Haber, MD 04/02/2015 2:46 PM

## 2015-04-02 NOTE — Patient Instructions (Signed)
You have significant arthritis in the lower lumbar region of her back. This involves mainly the right side of the L4-L5 facet joint. I'm referring her to an orthopedist and given you some medicine that hopefully will calm this down.

## 2015-04-07 ENCOUNTER — Telehealth: Payer: Self-pay

## 2015-04-07 MED ORDER — NEXIUM 40 MG PO CPDR: 40.0000 mg | DELAYED_RELEASE_CAPSULE | Freq: Every day | ORAL | Status: DC

## 2015-04-07 NOTE — Telephone Encounter (Signed)
Incoming fax from [facility] Nexium 40 mg 1 qd has been requested. Pt is a former Dr Olevia Perches pt. She has a follow up to establish with you on 05-08-15. Please advise on refills.

## 2015-04-07 NOTE — Telephone Encounter (Signed)
Ok to refill until follow up appt

## 2015-04-07 NOTE — Telephone Encounter (Signed)
Nexium filled for a 30 days supply per Dr Silverio Decamp.

## 2015-05-05 ENCOUNTER — Other Ambulatory Visit: Payer: Self-pay | Admitting: Gastroenterology

## 2015-05-08 ENCOUNTER — Other Ambulatory Visit (INDEPENDENT_AMBULATORY_CARE_PROVIDER_SITE_OTHER): Payer: BLUE CROSS/BLUE SHIELD

## 2015-05-08 ENCOUNTER — Ambulatory Visit (INDEPENDENT_AMBULATORY_CARE_PROVIDER_SITE_OTHER): Payer: BLUE CROSS/BLUE SHIELD | Admitting: Gastroenterology

## 2015-05-08 ENCOUNTER — Encounter: Payer: Self-pay | Admitting: Gastroenterology

## 2015-05-08 VITALS — BP 100/60 | HR 64 | Ht 62.5 in | Wt 138.0 lb

## 2015-05-08 DIAGNOSIS — K5010 Crohn's disease of large intestine without complications: Secondary | ICD-10-CM

## 2015-05-08 LAB — CBC WITH DIFFERENTIAL/PLATELET
BASOS ABS: 0 10*3/uL (ref 0.0–0.1)
Basophils Relative: 0.4 % (ref 0.0–3.0)
EOS PCT: 2.8 % (ref 0.0–5.0)
Eosinophils Absolute: 0.2 10*3/uL (ref 0.0–0.7)
HCT: 40.8 % (ref 36.0–46.0)
Hemoglobin: 13.8 g/dL (ref 12.0–15.0)
LYMPHS ABS: 1.3 10*3/uL (ref 0.7–4.0)
Lymphocytes Relative: 19.5 % (ref 12.0–46.0)
MCHC: 33.9 g/dL (ref 30.0–36.0)
MCV: 91.3 fl (ref 78.0–100.0)
MONO ABS: 0.5 10*3/uL (ref 0.1–1.0)
MONOS PCT: 7.8 % (ref 3.0–12.0)
NEUTROS ABS: 4.7 10*3/uL (ref 1.4–7.7)
NEUTROS PCT: 69.5 % (ref 43.0–77.0)
PLATELETS: 280 10*3/uL (ref 150.0–400.0)
RBC: 4.47 Mil/uL (ref 3.87–5.11)
RDW: 13 % (ref 11.5–15.5)
WBC: 6.7 10*3/uL (ref 4.0–10.5)

## 2015-05-08 LAB — VITAMIN B12: Vitamin B-12: >1500 pg/mL — ABNORMAL HIGH (ref 211–911)

## 2015-05-08 LAB — FOLATE: FOLATE: 14.2 ng/mL (ref >5.9)

## 2015-05-08 LAB — COMPREHENSIVE METABOLIC PANEL
ALT: 58 U/L — ABNORMAL HIGH (ref 0–35)
AST: 32 U/L (ref 0–37)
Albumin: 4.2 g/dL (ref 3.5–5.2)
Alkaline Phosphatase: 106 U/L (ref 39–117)
BUN: 12 mg/dL (ref 6–23)
CO2: 28 meq/L (ref 19–32)
Calcium: 9.1 mg/dL (ref 8.4–10.5)
Chloride: 105 mEq/L (ref 96–112)
Creatinine, Ser: 0.51 mg/dL (ref 0.40–1.20)
GFR: 132.16 mL/min (ref >60.00)
GLUCOSE: 155 mg/dL — AB (ref 70–99)
POTASSIUM: 4 meq/L (ref 3.5–5.1)
SODIUM: 140 meq/L (ref 135–145)
TOTAL PROTEIN: 6.4 g/dL (ref 6.0–8.3)
Total Bilirubin: 0.5 mg/dL (ref 0.2–1.2)

## 2015-05-08 LAB — FERRITIN: Ferritin: 115 ng/mL (ref 10.0–291.0)

## 2015-05-08 LAB — SEDIMENTATION RATE: Sed Rate: 15 mm/hr (ref 0–22)

## 2015-05-08 LAB — HIGH SENSITIVITY CRP: CRP, High Sensitivity: 2.24 mg/L (ref 0.000–5.000)

## 2015-05-08 MED ORDER — NA SULFATE-K SULFATE-MG SULF 17.5-3.13-1.6 GM/177ML PO SOLN: 1.0000 | Freq: Once | ORAL | Status: DC

## 2015-05-08 NOTE — Progress Notes (Signed)
[name]    [id number]    09-01-1960  Primary Care Physician:JEFFERY,CHELLE, PA-C  Referring Physician: Harrison Mons, PA-C [employer] [employer], [place] [address]  Chief complaint:   Crohn's ileitis  HPI: 54 year old white female with Crohn's ileitis since 1991, never had surgery. She has mild stenosis of the ileocecal valve as per last colonoscopy in March 2011.  She has never been hospitalized for Crohn's disease. She has been maintained on 6-MP 50 mg a day and intermittent doses of prednisone taper and Flagyl for bacterial overgrowth. She has known gastroesophageal reflux and had upper endoscopy in March 2011 which showed Grade A esophagitis. Denies any nausea, vomiting, abdominal pain, melena or bright red blood per rectum    Outpatient Encounter Prescriptions as of 05/08/2015  Medication Sig  . aspirin 81 MG chewable tablet Chew 81 mg by mouth daily.    Marland Kitchen atorvastatin (LIPITOR) 40 MG tablet TAKE 1 TABLET BY MOUTH ONCE DAILY  . celecoxib (CELEBREX) 200 MG capsule TAKE ONE CAPSULE BY MOUTH TWICE A DAY  . colestipol (COLESTID) 1 G tablet Take 1 tablet (1 g total) by mouth 2 (two) times daily.  . cyanocobalamin (,VITAMIN B-12,) 1000 MCG/ML injection Inject 1 mL (1,000 mcg total) into the muscle every 30 (thirty) days.  . cyclobenzaprine (FLEXERIL) 10 MG tablet Take 0.5-1 tablets (5-10 mg total) by mouth 3 (three) times daily as needed for muscle spasms.  . enalapril (VASOTEC) 5 MG tablet Take 1 tablet (5 mg total) by mouth daily.  Marland Kitchen EPIPEN 2-PAK 0.3 MG/0.3ML SOAJ injection   . fexofenadine (ALLEGRA) 180 MG tablet Take 1 tablet (180 mg total) by mouth daily.  Marland Kitchen HYDROcodone-acetaminophen (NORCO) 5-325 MG tablet Take 1 tablet by mouth every 6 (six) hours as needed for moderate pain.  Marland Kitchen mercaptopurine (PURINETHOL) 50 MG tablet Take 1 tablet (50 mg total) by mouth daily. Give on an empty stomach 1 hour before or 2 hours after meals. Caution: Chemotherapy.  .  metFORMIN (GLUCOPHAGE) 500 MG tablet Take 1 tablet (500 mg total) by mouth 2 (two) times daily with a meal.  . NEXIUM 40 MG capsule TAKE 1 CAPSULE BY MOUTH ONCE DAILY  . NON FORMULARY musinex  . sertraline (ZOLOFT) 50 MG tablet Take 1.5 tablets (75 mg total) by mouth daily.  Marland Kitchen zolpidem (AMBIEN) 10 MG tablet Take 10 mg by mouth at bedtime as needed for sleep.   . [DISCONTINUED] albuterol (PROVENTIL HFA;VENTOLIN HFA) 108 (90 BASE) MCG/ACT inhaler Inhale 2 puffs into the lungs every 6 (six) hours as needed for wheezing or shortness of breath.  . [DISCONTINUED] albuterol (PROVENTIL) (5 MG/ML) 0.5% nebulizer solution Take 0.5 mLs (2.5 mg total) by nebulization every 6 (six) hours as needed for wheezing or shortness of breath.  . [DISCONTINUED] ALPRAZolam (XANAX) 0.5 MG tablet Take 0.5 mg by mouth at bedtime as needed for anxiety.  . [DISCONTINUED] Guaifenesin (MUCINEX MAXIMUM STRENGTH) 1200 MG TB12 Take 1 tablet (1,200 mg total) by mouth every 12 (twelve) hours as needed. (Patient not taking: Reported on 04/02/2015)  . [DISCONTINUED] predniSONE (DELTASONE) 20 MG tablet Two daily with food   No facility-administered encounter medications on file as of 05/08/2015.    Allergies as of 05/08/2015  . (No Known Allergies)    Past Medical History  Diagnosis Date  . Vitamin B deficiency   . Anxiety state, unspecified   . Palpitations   . Depressive disorder, not elsewhere classified   .  Other and unspecified hyperlipidemia   . Esophageal reflux   . Calculus of gallbladder without mention of cholecystitis or obstruction   . Fibromyalgia   . Crohn disease ([place])   . Myalgia and myositis, unspecified   . Allergic rhinitis due to pollen   . Unspecified essential hypertension   . Diabetes mellitus without complication (HCC)     diet controlled  . Pneumonia 15    hx  . Bronchitis     hx  . Headache   . Arthritis     Past Surgical History  Procedure Laterality Date  . Tonsillectomy and  adenoidectomy    . Tubal ligation      BILATERAL  . Exploratory laparotomy      for endometriosis  . Esophagogastroduodenoscopy    . Cholecystectomy N/A 09/19/2014    Procedure: LAPAROSCOPIC CHOLECYSTECTOMY WITH INTRAOPERATIVE CHOLANGIOGRAM;  Surgeon: Donnie Mesa, MD;  Location: [facility] OR;  Service: General;  Laterality: N/A;    Family History  Problem Relation Age of Onset  . Lung cancer Maternal Grandmother   . Stroke Father   . Hypertension Mother   . Heart attack Mother   . Heart disease Mother   . Hypertension Brother   . Heart disease Brother   . Coronary artery disease Brother   . Hypertension Sister   . Heart disease Sister   . Colon cancer Neg Hx     Social History   Social History  . Marital Status: Married    Spouse Name: Dane  . Number of Children: 2  . Years of Education: 12+   Occupational History  . 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 [employer]   Social History Main Topics  . Smoking status: Former Smoker -- 1.00 packs/day for 35 years    Types: Cigarettes    Quit date: 05/31/2004  . Smokeless tobacco: Never Used  . Alcohol Use: No  . Drug Use: No  . Sexual Activity: No     Comment: married   Other Topics Concern  . Not on file   Social History Narrative   Lives with her husband and their pets. Her children are adults and live independently.      Review of systems: Review of Systems  Constitutional: Negative for fever and chills.  HENT: Negative.   Eyes: Negative for blurred vision.  Respiratory: Negative for cough, shortness of breath and wheezing.   Cardiovascular: Negative for chest pain and palpitations.  Gastrointestinal: as per HPI Genitourinary: Negative for dysuria, urgency, frequency and hematuria.  Musculoskeletal: Negative for myalgias, back pain and joint pain.  Skin: Negative for itching and rash.  Neurological: Negative for dizziness, tremors, focal weakness, seizures and loss of  consciousness.  Endo/Heme/Allergies: Negative for environmental allergies.  Psychiatric/Behavioral: Negative for depression, suicidal ideas and hallucinations.  All other systems reviewed and are negative.   Physical Exam: Filed Vitals:   05/08/15 1037  BP: 100/60  Pulse: 64   Gen:      No acute distress HEENT:  EOMI, sclera anicteric Neck:     No masses; no thyromegaly Lungs:    Clear to auscultation bilaterally; normal respiratory effort CV:         Regular rate and rhythm; no murmurs Abd:      + bowel sounds; soft, non-tender; no palpable masses, no distension Ext:    No edema; adequate peripheral perfusion Skin:      Warm and dry; no rash Neuro: alert and oriented x 3 Psych:  normal mood and affect  Data Reviewed:  March 2011 1. ESOPHAGUS, BIOPSY, : - GASTROESOPHAGEAL JUNCTION MUCOSA WITH MILD INFLAMMATION CONSISTENT WITH GASTROESOPHAGEAL REFLUX.NO INTESTINAL METAPLASIA, DYSPLASIA OR MALIGNANCY IDENTIFIED. 2. ILEUM, BIOPSY, TERMINAL : - PATCHY CHRONIC MINIMALLY ACTIVE ILEITIS.NO EVIDENCE OF GRANULOMA OR DYSPLASIA. 3. ILEOCECAL VALVE, BIOPSY, : - PATCHY CHRONIC MINIMALLY ACTIVE COLITIS, NO GRANULOMA OR DYSPLASIA. 4. COLON, BIOPSY, RANDOM : - BENIGN COLONIC MUCOSA.NO GRANULOMA OR DYSPLASIA.SEE COMMENT   Assessment and Plan/Recommendations:  54 year old female with chronic Crohn's ileitis diagnosed in 1991 on 6-MP in remission here for follow-up visit  We'll obtain CBC, CMP, ESR/CRP, iron studies, B12 and folate level  we will also check for QuantiFERON TB and hepatitis B&C   we will schedule for colonoscopy for surveillance  GERD symptoms stable on PPI  Continue antireflux measures  Return after the procedure  K. Denzil Magnuson , MD [phone] Mon-Fri 8a-5p [phone] after 5p, weekends, holidays

## 2015-05-08 NOTE — Patient Instructions (Signed)
You have been scheduled for a colonoscopy. Please follow written instructions given to you at your visit today.  Please pick up your prep supplies at the pharmacy within the next 1-3 days. If you use inhalers (even only as needed), please bring them with you on the day of your procedure. Your physician has requested that you go to www.startemmi.com and enter the access code given to you at your visit today. This web site gives a general overview about your procedure. However, you should still follow specific instructions given to you by our office regarding your preparation for the procedure.  Follow up in 2 months  Go to the basement for labs today

## 2015-05-09 LAB — HEPATITIS B SURFACE ANTIBODY,QUALITATIVE: HEP B S AB: NEGATIVE

## 2015-05-09 LAB — HEPATITIS C ANTIBODY: HCV Ab: NEGATIVE

## 2015-05-09 LAB — HEPATITIS B SURFACE ANTIGEN: HEP B S AG: NEGATIVE

## 2015-05-10 LAB — QUANTIFERON TB GOLD ASSAY (BLOOD)
Interferon Gamma Release Assay: NEGATIVE
Mitogen value: 4.15 IU/mL
QUANTIFERON NIL VALUE: 0.04 [IU]/mL
QUANTIFERON TB AG MINUS NIL: -0.01 [IU]/mL
TB Ag value: 0.03 IU/mL

## 2015-05-28 ENCOUNTER — Other Ambulatory Visit: Payer: Self-pay | Admitting: Gastroenterology

## 2015-05-28 ENCOUNTER — Other Ambulatory Visit: Payer: Self-pay | Admitting: Physician Assistant

## 2015-06-03 ENCOUNTER — Telehealth (HOSPITAL_COMMUNITY): Payer: Self-pay | Admitting: Vascular Surgery

## 2015-06-03 NOTE — Telephone Encounter (Signed)
DR. Marianna Payment called she would like this pt to be seen in this clinic by [facility] ASAP (today) , PT is having Chest pain, she has not seen in this clinic [address] gave her this # to call

## 2015-06-03 NOTE — Telephone Encounter (Signed)
Tried to call Dr Marianna Payment, left message and did not hear back.  However, agree that with active chest pain she needs to be seen in the ER.

## 2015-06-03 NOTE — Telephone Encounter (Signed)
Dr. Aundra Dubin made aware, we do not see patients for chest pain or other emergent medical needs and she will need to report to emergency room.  Dr. Aundra Dubin with call provider back and explain this.

## 2015-06-06 ENCOUNTER — Telehealth: Payer: Self-pay | Admitting: Gastroenterology

## 2015-06-06 MED ORDER — COLESTIPOL HCL 1 G PO TABS: 1.0000 g | ORAL_TABLET | Freq: Two times a day (BID) | ORAL | Status: DC

## 2015-06-06 NOTE — Telephone Encounter (Signed)
Called patient to inform that med was sent

## 2015-06-16 ENCOUNTER — Encounter: Payer: Self-pay | Admitting: Cardiology

## 2015-06-23 ENCOUNTER — Other Ambulatory Visit (INDEPENDENT_AMBULATORY_CARE_PROVIDER_SITE_OTHER): Payer: BLUE CROSS/BLUE SHIELD

## 2015-06-23 DIAGNOSIS — Z79899 Other long term (current) drug therapy: Secondary | ICD-10-CM

## 2015-06-23 DIAGNOSIS — Z7960 Long term (current) use of unspecified immunomodulators and immunosuppressants: Secondary | ICD-10-CM

## 2015-06-23 LAB — CBC WITH DIFFERENTIAL/PLATELET
BASOS PCT: 0.5 % (ref 0.0–3.0)
Basophils Absolute: 0 10*3/uL (ref 0.0–0.1)
EOS ABS: 0.2 10*3/uL (ref 0.0–0.7)
EOS PCT: 2.1 % (ref 0.0–5.0)
HCT: 41.5 % (ref 36.0–46.0)
HEMOGLOBIN: 13.8 g/dL (ref 12.0–15.0)
LYMPHS ABS: 1.7 10*3/uL (ref 0.7–4.0)
Lymphocytes Relative: 21.8 % (ref 12.0–46.0)
MCHC: 33.3 g/dL (ref 30.0–36.0)
MCV: 92.7 fl (ref 78.0–100.0)
MONO ABS: 0.6 10*3/uL (ref 0.1–1.0)
Monocytes Relative: 8.1 % (ref 3.0–12.0)
NEUTROS ABS: 5.2 10*3/uL (ref 1.4–7.7)
Neutrophils Relative %: 67.5 % (ref 43.0–77.0)
PLATELETS: 303 10*3/uL (ref 150.0–400.0)
RBC: 4.47 Mil/uL (ref 3.87–5.11)
RDW: 13.6 % (ref 11.5–15.5)
WBC: 7.7 10*3/uL (ref 4.0–10.5)

## 2015-06-23 LAB — HEPATIC FUNCTION PANEL
ALK PHOS: 118 U/L — AB (ref 39–117)
ALT: 68 U/L — AB (ref 0–35)
AST: 34 U/L (ref 0–37)
Albumin: 4.3 g/dL (ref 3.5–5.2)
BILIRUBIN DIRECT: 0.1 mg/dL (ref 0.0–0.3)
BILIRUBIN TOTAL: 0.4 mg/dL (ref 0.2–1.2)
Total Protein: 6.5 g/dL (ref 6.0–8.3)

## 2015-06-27 ENCOUNTER — Ambulatory Visit (AMBULATORY_SURGERY_CENTER): Payer: BLUE CROSS/BLUE SHIELD | Admitting: Gastroenterology

## 2015-06-27 ENCOUNTER — Encounter: Payer: Self-pay | Admitting: Gastroenterology

## 2015-06-27 VITALS — BP 130/72 | HR 71 | Temp 99.0°F | Resp 18 | Ht 62.5 in | Wt 138.0 lb

## 2015-06-27 DIAGNOSIS — Z1211 Encounter for screening for malignant neoplasm of colon: Secondary | ICD-10-CM | POA: Diagnosis not present

## 2015-06-27 DIAGNOSIS — K5010 Crohn's disease of large intestine without complications: Secondary | ICD-10-CM

## 2015-06-27 LAB — GLUCOSE, CAPILLARY
GLUCOSE-CAPILLARY: 127 mg/dL — AB (ref 65–99)
GLUCOSE-CAPILLARY: 99 mg/dL (ref 65–99)

## 2015-06-27 MED ORDER — SODIUM CHLORIDE 0.9 % IV SOLN: 500.0000 mL | INTRAVENOUS | Status: DC

## 2015-06-27 NOTE — Progress Notes (Signed)
Report to PACU, RN, vss, BBS= Clear.  

## 2015-06-27 NOTE — Op Note (Signed)
[place]  [address]. [place], [address]   COLONOSCOPY PROCEDURE REPORT  PATIENT: [name], [name]  MR#: [id number] BIRTHDATE: Aug 08, 1960 , 8  yrs. old GENDER: female ENDOSCOPIST: Harl Bowie, MD REFERRED OL:IDCVUDT Chelle PA PROCEDURE DATE:  06/27/2015 PROCEDURE:   Colonoscopy, surveillance and Colonoscopy with biopsy First Screening Colonoscopy - Avg.  risk and is 50 yrs.  old or older - No.  Prior Negative Screening - Now for repeat screening. Less than 10 yrs Prior Negative Screening - Now for repeat screening.  Above average risk  History of Adenoma - Now for follow-up colonoscopy & has been > or = to 3 yrs.  N/A  Polyps removed today? No Recommend repeat exam, <10 yrs? Yes high risk ASA CLASS:   Class II INDICATIONS:Inflammatory bowel disease of the intestine if more precise diagnosis or determination of the extent / severity of activity of disease will influence immediate / future management and Inflammatory Bowel Disease ( = 8 years pancolitis or = 15 years left sided colitis). MEDICATIONS: Propofol 200 mg IV  DESCRIPTION OF PROCEDURE:   After the risks benefits and alternatives of the procedure were thoroughly explained, informed consent was obtained.  The digital rectal exam revealed no abnormalities of the rectum.   The [id number]  endoscope was introduced through the anus and advanced to the terminal ileum which was intubated for a short distance. No adverse events experienced.   The quality of the prep was good.  The instrument was then slowly withdrawn as the colon was fully examined. Estimated blood loss is zero unless otherwise noted in this procedure report.      COLON FINDINGS: Neo terminal ileum mucosa appeared normal except for few inflammatory appearing nodules, biopsies were obtained.   The examination was otherwise normal.  Retroflexed views revealed no abnormalities. The time to cecum = 2.9 Withdrawal time = 12.5    The scope was withdrawn and the procedure completed. COMPLICATIONS: There were no immediate complications.  ENDOSCOPIC IMPRESSION: 1.   Neo terminal ileum mucosa appeared normal except for few inflammatory appearing nodules, biopsies were obtained 2.   The examination was otherwise normal  RECOMMENDATIONS: 1.  Await pathology results 2.  You will need a repeat colonoscopy in 5 years.  eSigned:  Harl Bowie, MD 06/27/2015 3:09 PM     PATIENT NAME:  [name], [name] MR#: [id number]

## 2015-06-27 NOTE — Patient Instructions (Signed)
YOU HAD AN ENDOSCOPIC PROCEDURE TODAY AT [facility]:   Refer to the procedure report that was given to you for any specific questions about what was found during the examination.  If the procedure report does not answer your questions, please call your gastroenterologist to clarify.  If you requested that your care partner not be given the details of your procedure findings, then the procedure report has been included in a sealed envelope for you to review at your convenience later.  YOU SHOULD EXPECT: Some feelings of bloating in the abdomen. Passage of more gas than usual.  Walking can help get rid of the air that was put into your GI tract during the procedure and reduce the bloating. If you had a lower endoscopy (such as a colonoscopy or flexible sigmoidoscopy) you may notice spotting of blood in your stool or on the toilet paper. If you underwent a bowel prep for your procedure, you may not have a normal bowel movement for a few days.  Please Note:  You might notice some irritation and congestion in your nose or some drainage.  This is from the oxygen used during your procedure.  There is no need for concern and it should clear up in a day or so.  SYMPTOMS TO REPORT IMMEDIATELY:   Following lower endoscopy (colonoscopy or flexible sigmoidoscopy):  Excessive amounts of blood in the stool  Significant tenderness or worsening of abdominal pains  Swelling of the abdomen that is new, acute  Fever of 100F or higher   For urgent or emergent issues, a gastroenterologist can be reached at any hour by calling [phone].   DIET: Your first meal following the procedure should be a small meal and then it is ok to progress to your normal diet. Heavy or fried foods are harder to digest and may make you feel nauseous or bloated.  Likewise, meals heavy in dairy and vegetables can increase bloating.  Drink plenty of fluids but you should avoid alcoholic beverages for 24  hours.  ACTIVITY:  You should plan to take it easy for the rest of today and you should NOT DRIVE or use heavy machinery until tomorrow (because of the sedation medicines used during the test).    FOLLOW UP: Our staff will call the number listed on your records the next business day following your procedure to check on you and address any questions or concerns that you may have regarding the information given to you following your procedure. If we do not reach you, we will leave a message.  However, if you are feeling well and you are not experiencing any problems, there is no need to return our call.  We will assume that you have returned to your regular daily activities without incident.  If any biopsies were taken you will be contacted by phone or by letter within the next 1-3 weeks.  Please call us at [phone] if you have not heard about the biopsies in 3 weeks.    SIGNATURES/CONFIDENTIALITY: You and/or your care partner have signed paperwork which will be entered into your electronic medical record.  These signatures attest to the fact that that the information above on your After Visit Summary has been reviewed and is understood.  Full responsibility of the confidentiality of this discharge information lies with you and/or your care-partner.  Repeat colonoscopy in 5 years 2023.

## 2015-06-27 NOTE — Progress Notes (Signed)
Called to room to assist during endoscopic procedure.  Patient ID and intended procedure confirmed with present staff. Received instructions for my participation in the procedure from the performing physician.  

## 2015-06-30 ENCOUNTER — Telehealth: Payer: Self-pay

## 2015-06-30 NOTE — Telephone Encounter (Signed)
  Follow up Call-  Call back number 06/27/2015  Post procedure Call Back phone  # [phone]  Permission to leave phone message Yes     Patient questions:  Do you have a fever, pain , or abdominal swelling? No. Pain Score  0 *  Have you tolerated food without any problems? Yes.    Have you been able to return to your normal activities? Yes.    Do you have any questions about your discharge instructions: Diet   No. Medications  No. Follow up visit  No.  Do you have questions or concerns about your Care? No.  Actions: * If pain score is 4 or above: No action needed, pain <4.

## 2015-07-15 ENCOUNTER — Encounter: Payer: Self-pay | Admitting: Gastroenterology

## 2015-08-12 ENCOUNTER — Telehealth: Payer: Self-pay | Admitting: Cardiology

## 2015-08-12 NOTE — Telephone Encounter (Signed)
New message      Calling to see if we received a fax on pts liver enzymes from [facility].  They were high and she wanted Dr Aundra Dubin to see it

## 2015-08-12 NOTE — Telephone Encounter (Signed)
The pt is advised that we have not received her lab results yet. She states that she will ask them to re fax to [phone].

## 2015-08-29 ENCOUNTER — Other Ambulatory Visit (HOSPITAL_COMMUNITY): Payer: Self-pay | Admitting: Cardiology

## 2015-09-08 ENCOUNTER — Telehealth: Payer: Self-pay | Admitting: Cardiology

## 2015-09-08 ENCOUNTER — Encounter: Payer: Self-pay | Admitting: Cardiology

## 2015-09-08 DIAGNOSIS — E785 Hyperlipidemia, unspecified: Secondary | ICD-10-CM

## 2015-09-08 NOTE — Telephone Encounter (Signed)
Results of lab done 08/25/15 now scanned in Epic.

## 2015-09-08 NOTE — Telephone Encounter (Signed)
Follow up  Pt returned call

## 2015-09-08 NOTE — Telephone Encounter (Signed)
Pt states she had lab done 08/11/15 by her PCP, these are scanned in [facility].  Pt states based on those lab results she was advised by her PCP  to stop lipitor and repeat liver profile in 2 weeks. Pt states she had repeat liver profile done 08/26/15. Pt advised I do not see repeat results done 08/26/15 in Epic, she will have them faxed today.   Pt would like Dr Claris Gladden recommendation about cholesterol medication once he has reviewed recent lab.  Pt advised I will forward to Dr Aundra Dubin for review.

## 2015-09-09 NOTE — Telephone Encounter (Signed)
She has had very mildly elevated transaminases for a long time.  The one reading from early March was a little higher than the past, then it returned to her usual range on the 2nd draw from March.  I doubt it is related to atorvastatin.  Think she can restart atorvastatin and just have LFTs checked again in about 3 wks.

## 2015-09-15 NOTE — Telephone Encounter (Signed)
The pt is advised and she is in agreement with plan. LFTs have been ordered and scheduled to be drawn on 10/06/15.

## 2015-10-06 ENCOUNTER — Other Ambulatory Visit (INDEPENDENT_AMBULATORY_CARE_PROVIDER_SITE_OTHER): Payer: BLUE CROSS/BLUE SHIELD | Admitting: *Deleted

## 2015-10-06 DIAGNOSIS — E785 Hyperlipidemia, unspecified: Secondary | ICD-10-CM

## 2015-10-06 LAB — HEPATIC FUNCTION PANEL
ALBUMIN: 4.3 g/dL (ref 3.6–5.1)
ALK PHOS: 103 U/L (ref 33–130)
ALT: 141 U/L — ABNORMAL HIGH (ref 6–29)
AST: 68 U/L — AB (ref 10–35)
BILIRUBIN DIRECT: 0.1 mg/dL (ref <=0.2)
BILIRUBIN TOTAL: 0.7 mg/dL (ref 0.2–1.2)
Indirect Bilirubin: 0.6 mg/dL (ref 0.2–1.2)
Total Protein: 6.1 g/dL (ref 6.1–8.1)

## 2015-10-07 ENCOUNTER — Other Ambulatory Visit: Payer: Self-pay | Admitting: *Deleted

## 2015-10-08 ENCOUNTER — Telehealth: Payer: Self-pay | Admitting: Cardiology

## 2015-10-08 NOTE — Telephone Encounter (Signed)
Pt would like her lab report from 10-06-15 faxed to her doc @ [phone] att: Dr.Tuan Cameron Ali

## 2015-10-09 ENCOUNTER — Other Ambulatory Visit: Payer: Self-pay | Admitting: *Deleted

## 2015-10-09 DIAGNOSIS — R7401 Elevation of levels of liver transaminase levels: Secondary | ICD-10-CM

## 2015-10-09 DIAGNOSIS — R740 Nonspecific elevation of levels of transaminase and lactic acid dehydrogenase [LDH]: Principal | ICD-10-CM

## 2015-10-16 ENCOUNTER — Other Ambulatory Visit: Payer: Self-pay | Admitting: Obstetrics and Gynecology

## 2015-10-16 DIAGNOSIS — Z1231 Encounter for screening mammogram for malignant neoplasm of breast: Secondary | ICD-10-CM | POA: Diagnosis not present

## 2015-10-16 DIAGNOSIS — Z124 Encounter for screening for malignant neoplasm of cervix: Secondary | ICD-10-CM | POA: Diagnosis not present

## 2015-10-16 DIAGNOSIS — Z6824 Body mass index (BMI) 24.0-24.9, adult: Secondary | ICD-10-CM | POA: Diagnosis not present

## 2015-10-16 DIAGNOSIS — Z01419 Encounter for gynecological examination (general) (routine) without abnormal findings: Secondary | ICD-10-CM | POA: Diagnosis not present

## 2015-10-17 LAB — CYTOLOGY - PAP

## 2015-10-22 ENCOUNTER — Other Ambulatory Visit: Payer: BLUE CROSS/BLUE SHIELD

## 2015-10-23 ENCOUNTER — Other Ambulatory Visit (INDEPENDENT_AMBULATORY_CARE_PROVIDER_SITE_OTHER): Payer: BLUE CROSS/BLUE SHIELD | Admitting: *Deleted

## 2015-10-23 DIAGNOSIS — R740 Nonspecific elevation of levels of transaminase and lactic acid dehydrogenase [LDH]: Secondary | ICD-10-CM

## 2015-10-23 DIAGNOSIS — R7401 Elevation of levels of liver transaminase levels: Secondary | ICD-10-CM

## 2015-10-23 LAB — HEPATIC FUNCTION PANEL
ALT: 94 U/L — AB (ref 6–29)
AST: 38 U/L — ABNORMAL HIGH (ref 10–35)
Albumin: 4.2 g/dL (ref 3.6–5.1)
Alkaline Phosphatase: 110 U/L (ref 33–130)
BILIRUBIN DIRECT: 0.1 mg/dL (ref <=0.2)
BILIRUBIN INDIRECT: 0.5 mg/dL (ref 0.2–1.2)
BILIRUBIN TOTAL: 0.6 mg/dL (ref 0.2–1.2)
Total Protein: 6.2 g/dL (ref 6.1–8.1)

## 2015-10-24 NOTE — Telephone Encounter (Signed)
Pt made aware of results no questions at this time.  Pt given lab results

## 2015-10-24 NOTE — Telephone Encounter (Signed)
Follow Up ° °Pt returned call//  °

## 2015-10-25 DIAGNOSIS — N3090 Cystitis, unspecified without hematuria: Secondary | ICD-10-CM | POA: Diagnosis not present

## 2015-10-25 DIAGNOSIS — R319 Hematuria, unspecified: Secondary | ICD-10-CM | POA: Diagnosis not present

## 2015-11-10 ENCOUNTER — Encounter: Payer: Self-pay | Admitting: Cardiology

## 2015-11-11 ENCOUNTER — Telehealth: Payer: Self-pay | Admitting: Cardiology

## 2015-11-11 ENCOUNTER — Telehealth: Payer: Self-pay | Admitting: Gastroenterology

## 2015-11-11 DIAGNOSIS — R7989 Other specified abnormal findings of blood chemistry: Secondary | ICD-10-CM

## 2015-11-11 DIAGNOSIS — R945 Abnormal results of liver function studies: Principal | ICD-10-CM

## 2015-11-11 NOTE — Telephone Encounter (Signed)
New message    The pt wants to speak a nurse to get lab results.

## 2015-11-11 NOTE — Telephone Encounter (Signed)
Patient notified  She will come for labs in 2 weeks.  Follow up scheduled for 12/18/15

## 2015-11-11 NOTE — Telephone Encounter (Signed)
Pt given negative hepatitis panel results scanned in to Epic 11/10/15.

## 2015-11-11 NOTE — Telephone Encounter (Signed)
Patient is on a very low dose of 6MP. Crohn's disease was in remission based on most recent colonoscopy. Please advise her to stop 6MP and recheck LFT in 2 weeks. Follow up in office in 4-6 weeks.

## 2015-11-11 NOTE — Telephone Encounter (Signed)
Dr. Silverio Decamp please review recent LFT's>  Dr. Aundra Dubin has made changes to her statin's, but would like you to review and advise if needs other changes to medication regimen.  Patient is on 6MP

## 2015-11-25 ENCOUNTER — Other Ambulatory Visit (INDEPENDENT_AMBULATORY_CARE_PROVIDER_SITE_OTHER): Payer: BLUE CROSS/BLUE SHIELD

## 2015-11-25 DIAGNOSIS — R7989 Other specified abnormal findings of blood chemistry: Secondary | ICD-10-CM | POA: Diagnosis not present

## 2015-11-25 DIAGNOSIS — R945 Abnormal results of liver function studies: Principal | ICD-10-CM

## 2015-11-25 LAB — HEPATIC FUNCTION PANEL
ALT: 103 U/L — ABNORMAL HIGH (ref 0–35)
AST: 51 U/L — ABNORMAL HIGH (ref 0–37)
Albumin: 4.3 g/dL (ref 3.5–5.2)
Alkaline Phosphatase: 133 U/L — ABNORMAL HIGH (ref 39–117)
BILIRUBIN DIRECT: 0.1 mg/dL (ref 0.0–0.3)
TOTAL PROTEIN: 6.7 g/dL (ref 6.0–8.3)
Total Bilirubin: 0.6 mg/dL (ref 0.2–1.2)

## 2015-11-26 ENCOUNTER — Other Ambulatory Visit: Payer: BLUE CROSS/BLUE SHIELD

## 2015-11-26 DIAGNOSIS — R7989 Other specified abnormal findings of blood chemistry: Secondary | ICD-10-CM | POA: Diagnosis not present

## 2015-11-26 DIAGNOSIS — R945 Abnormal results of liver function studies: Principal | ICD-10-CM

## 2015-11-27 LAB — ANTI-SMOOTH MUSCLE ANTIBODY, IGG

## 2015-11-27 LAB — MITOCHONDRIAL ANTIBODIES

## 2015-11-28 ENCOUNTER — Telehealth: Payer: Self-pay | Admitting: Gastroenterology

## 2015-11-28 LAB — ANA: ANA: NEGATIVE

## 2015-12-10 NOTE — Telephone Encounter (Signed)
Results given to pt

## 2015-12-18 ENCOUNTER — Other Ambulatory Visit (INDEPENDENT_AMBULATORY_CARE_PROVIDER_SITE_OTHER): Payer: BLUE CROSS/BLUE SHIELD

## 2015-12-18 ENCOUNTER — Ambulatory Visit (INDEPENDENT_AMBULATORY_CARE_PROVIDER_SITE_OTHER): Payer: BLUE CROSS/BLUE SHIELD | Admitting: Gastroenterology

## 2015-12-18 ENCOUNTER — Encounter: Payer: Self-pay | Admitting: Gastroenterology

## 2015-12-18 VITALS — BP 122/70 | HR 82 | Ht 62.5 in | Wt 134.0 lb

## 2015-12-18 DIAGNOSIS — R7989 Other specified abnormal findings of blood chemistry: Secondary | ICD-10-CM | POA: Diagnosis not present

## 2015-12-18 DIAGNOSIS — K7581 Nonalcoholic steatohepatitis (NASH): Secondary | ICD-10-CM

## 2015-12-18 DIAGNOSIS — K5090 Crohn's disease, unspecified, without complications: Secondary | ICD-10-CM

## 2015-12-18 DIAGNOSIS — R945 Abnormal results of liver function studies: Secondary | ICD-10-CM

## 2015-12-18 LAB — LIPID PANEL
CHOLESTEROL: 247 mg/dL — AB (ref 0–200)
HDL: 34.1 mg/dL — ABNORMAL LOW (ref >39.00)
Total CHOL/HDL Ratio: 7

## 2015-12-18 LAB — HEMOGLOBIN A1C: Hgb A1c MFr Bld: 7.1 % — ABNORMAL HIGH (ref 4.6–6.5)

## 2015-12-18 LAB — HEPATIC FUNCTION PANEL
ALBUMIN: 4.2 g/dL (ref 3.5–5.2)
ALT: 120 U/L — ABNORMAL HIGH (ref 0–35)
AST: 65 U/L — ABNORMAL HIGH (ref 0–37)
Alkaline Phosphatase: 146 U/L — ABNORMAL HIGH (ref 39–117)
BILIRUBIN DIRECT: 0.1 mg/dL (ref 0.0–0.3)
TOTAL PROTEIN: 6.8 g/dL (ref 6.0–8.3)
Total Bilirubin: 0.7 mg/dL (ref 0.2–1.2)

## 2015-12-18 LAB — LDL CHOLESTEROL, DIRECT: LDL DIRECT: 85 mg/dL

## 2015-12-18 MED ORDER — VSL#3 PO PACK: PACK | ORAL | Status: DC

## 2015-12-18 NOTE — Progress Notes (Signed)
[name]    [id number]    [birth year]  Primary Care Physician:Chelle Lloyd Huger  Referring Physician: Harrison Mons, PA-C [place], [place] [address]  Chief complaint:  Abnormal LFT  HPI:  55 year old white female with Crohn's ileitis since 78. Neo terminal ileum mucosa appeared normal except for few inflammatory appearing nodules, biopsies were obtained which did not show any active inflammation on colonoscopy in January 2017.  The examination was otherwise normal . She has never been hospitalized for Crohn's disease. She has been maintained on 6-MP 50 mg daily and and has been on intermittent doses of prednisone taper for ? Crohn's flare. She has been having elevated AST ~50-60 and ALT ~90-100, she was taken off statin by Cardiologist in early May 2017 with no significant change in transaminases. At discontinued 6-MP in early June and repeat LFT in 2 weeks did not show any significant change in transaminases either.  Denies any nausea, vomiting, abdominal pain, melena or bright red blood per rectum. Her bowel movements are at baseline with 2-4 semi-formed stool a day. On review of systems complained of diffuse abdominal bloating and lower abdominal cramping pain occasionally. She has significant family history of hypercholesterolemia, he has been taking over the counter CoQ10 given she is off statin.   Outpatient Encounter Prescriptions as of 12/18/2015  Medication Sig  . aspirin 81 MG chewable tablet Chew 81 mg by mouth daily.    . celecoxib (CELEBREX) 200 MG capsule TAKE 1 CAPSULE BY MOUTH TWICE DAILY  . colestipol (COLESTID) 1 g tablet Take 1 tablet (1 g total) by mouth 2 (two) times daily.  . cyclobenzaprine (FLEXERIL) 10 MG tablet Take 0.5-1 tablets (5-10 mg total) by mouth 3 (three) times daily as needed for muscle spasms.  . enalapril (VASOTEC) 5 MG tablet Take 1 tablet (5 mg total) by mouth daily.  Marland Kitchen EPIPEN 2-PAK 0.3 MG/0.3ML SOAJ injection  Reported on 06/27/2015  . levocetirizine (XYZAL) 5 MG tablet Take 5 mg by mouth every evening.  . metFORMIN (GLUCOPHAGE) 500 MG tablet Take 1 tablet (500 mg total) by mouth 2 (two) times daily with a meal.  . montelukast (SINGULAIR) 10 MG tablet Take 10 mg by mouth at bedtime.  Marland Kitchen NEXIUM 40 MG capsule TAKE 1 CAPSULE BY MOUTH ONCE DAILY  . zolpidem (AMBIEN) 10 MG tablet Take 10 mg by mouth at bedtime as needed for sleep.   . Dietary Management Product (VSL#3) PACK 900 billion packet by mouth daily  . fexofenadine (ALLEGRA) 180 MG tablet Take 1 tablet (180 mg total) by mouth daily. (Patient not taking: Reported on 12/18/2015)  . [DISCONTINUED] cyanocobalamin (,VITAMIN B-12,) 1000 MCG/ML injection Inject 1 mL (1,000 mcg total) into the muscle every 30 (thirty) days.  . [DISCONTINUED] HYDROcodone-acetaminophen (NORCO) 5-325 MG tablet Take 1 tablet by mouth every 6 (six) hours as needed for moderate pain.  . [DISCONTINUED] mercaptopurine (PURINETHOL) 50 MG tablet TAKE 1 TABLET BY MOUTH DAILY ON AN EMPTYSTOMACH ONE HOUR BEFORE OR TWO HOURS AFTER MEALS  . [DISCONTINUED] NON FORMULARY musinex  . [DISCONTINUED] sertraline (ZOLOFT) 50 MG tablet Take 1.5 tablets (75 mg total) by mouth daily.   No facility-administered encounter medications on file as of 12/18/2015.    Allergies as of 12/18/2015 - Review Complete 12/18/2015  Allergen Reaction Noted  . Atorvastatin Other (See Comments) 03/28/2013    Past Medical History  Diagnosis Date  . Vitamin B deficiency   . Anxiety  state, unspecified   . Palpitations   . Depressive disorder, not elsewhere classified   . Other and unspecified hyperlipidemia   . Esophageal reflux   . Calculus of gallbladder without mention of cholecystitis or obstruction   . Fibromyalgia   . Crohn disease ([place])   . Myalgia and myositis, unspecified   . Allergic rhinitis due to pollen   . Unspecified essential hypertension   . Diabetes mellitus without complication (HCC)      diet controlled  . Pneumonia 13    hx  . Bronchitis     hx  . Headache   . Arthritis     Past Surgical History  Procedure Laterality Date  . Tonsillectomy and adenoidectomy    . Tubal ligation      BILATERAL  . Exploratory laparotomy      for endometriosis  . Esophagogastroduodenoscopy    . Cholecystectomy N/A 09/19/2014    Procedure: LAPAROSCOPIC CHOLECYSTECTOMY WITH INTRAOPERATIVE CHOLANGIOGRAM;  Surgeon: Donnie Mesa, MD;  Location: [facility] OR;  Service: General;  Laterality: N/A;    Family History  Problem Relation Age of Onset  . Lung cancer Maternal Grandmother   . Stroke Father   . Hypertension Mother   . Heart attack Mother   . Heart disease Mother   . Hypertension Brother   . Heart disease Brother   . Coronary artery disease Brother   . Hypertension Sister   . Heart disease Sister   . Colon cancer Neg Hx     Social History   Social History  . Marital Status: Married    Spouse Name: Dane  . Number of Children: 2  . Years of Education: 12+   Occupational History  . 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 [employer]   Social History Main Topics  . Smoking status: Former Smoker -- 1.00 packs/day for 35 years    Types: Cigarettes    Quit date: 05/31/2004  . Smokeless tobacco: Never Used  . Alcohol Use: No  . Drug Use: No  . Sexual Activity: No     Comment: married   Other Topics Concern  . Not on file   Social History Narrative   Lives with her husband and their pets. Her children are adults and live independently.      Review of systems: Review of Systems  Constitutional: Negative for fever and chills.  positive for decreased energy and fatigue HENT: Negative.   Eyes: Negative for blurred vision.  Respiratory: Negative for cough, shortness of breath and wheezing.   Cardiovascular: Negative for chest pain and palpitations.  Gastrointestinal: as per HPI Genitourinary: Negative for dysuria, urgency,  frequency and hematuria.  Musculoskeletal: Positive for myalgias, back pain and joint pain.  Skin: Negative for itching and rash.  Neurological: Negative for dizziness, tremors, focal weakness, seizures and loss of consciousness.  Endo/Heme/Allergies: Positive for environmental allergies.  Psychiatric/Behavioral: Negative for depression, suicidal ideas and hallucinations.  All other systems reviewed and are negative.   Physical Exam: Filed Vitals:   12/18/15 0907  BP: 122/70  Pulse: 82   Gen:      No acute distress HEENT:  EOMI, sclera anicteric Neck:     No masses; no thyromegaly Lungs:    Clear to auscultation bilaterally; normal respiratory effort CV:         Regular rate and rhythm; no murmurs Abd:      + bowel sounds; soft, non-tender; no palpable masses, no distension Ext:  No edema; adequate peripheral perfusion Skin:      Warm and dry; no rash Neuro: alert and oriented x 3 Psych: normal mood and affect  Data Reviewed:  Reviewed labs, diagnostic workup in epic and discussed with patient   Assessment and Plan/Recommendations:  55 year old female with history of Crohn's ileitis initially diagnosed in 1991, with neoterminal ileum, no evidence of active inflammation based on recent colonoscopy with biopsies in January 2017 Continues to have persistent elevation in transaminases concerning for possible drug-induced liver injury versus nonalcoholic steato hepatitis (patient does have metabolic syndrome with diabetes and hypercholesterolemia) No significant change in transaminases after discontinuing 6-MP and Statin, may be slightly higher compared to before Advised patient to stop taking CoQ10, celecoxib, and abstain from alcohol Will check A1c and lipid panel If has elevated cholesterol will consider restarting statin with close monitoring of LFT Obtain abdominal ultrasound Crohn's disease: Currently asymptomatic and appears to be in remission Continue to hold 6-MP We'll  start VSL#3 double strength 1 packet daily to maintain remission Recheck LFT in 4-6 weeks  25 minutes was spent face-to-face with the patient. Greater than 50% of the time used for counseling  as well as treatment plan and follow-up. She had multiple questions which were answered to her satisfaction   K. Denzil Magnuson , MD [phone] Mon-Fri 8a-5p [phone] after 5p, weekends, holidays  CC: Harrison Mons, PA-C

## 2015-12-18 NOTE — Patient Instructions (Addendum)
Discontinue Celebrex Use Nexium 1m daily before breakfast Use Ranitidine at bedtime Discontinue CoQ10 Go to the basement today for labs We will send VSL #3 900 BU to your pharmacy  We will send Vitamin E 400 IU to your pharmacy  Follow up on 02/26/2016 at 10:30am  [facility] on 12/19/2015 at 9am. Please arrive 15 minutes prior to your appointment for registration. Make certain not to have anything to eat or drink 6 hours prior to your appointment. Should you need to reschedule your appointment, please contact radiology at [phone] This test typically takes about 30 minutes to perform.  Recheck labs in 6 weeks

## 2015-12-19 ENCOUNTER — Ambulatory Visit (HOSPITAL_COMMUNITY)
Admission: RE | Admit: 2015-12-19 | Discharge: 2015-12-19 | Disposition: A | Discharge status: Home / Self Care | Payer: BLUE CROSS/BLUE SHIELD | Source: Ambulatory Visit | Attending: Gastroenterology | Admitting: Gastroenterology

## 2015-12-19 DIAGNOSIS — R7989 Other specified abnormal findings of blood chemistry: Secondary | ICD-10-CM | POA: Insufficient documentation

## 2015-12-19 DIAGNOSIS — R932 Abnormal findings on diagnostic imaging of liver and biliary tract: Secondary | ICD-10-CM | POA: Diagnosis not present

## 2015-12-19 DIAGNOSIS — Z9049 Acquired absence of other specified parts of digestive tract: Secondary | ICD-10-CM | POA: Insufficient documentation

## 2015-12-19 DIAGNOSIS — K7581 Nonalcoholic steatohepatitis (NASH): Secondary | ICD-10-CM | POA: Diagnosis not present

## 2015-12-19 DIAGNOSIS — K5090 Crohn's disease, unspecified, without complications: Secondary | ICD-10-CM | POA: Diagnosis not present

## 2015-12-19 DIAGNOSIS — R945 Abnormal results of liver function studies: Secondary | ICD-10-CM

## 2015-12-23 ENCOUNTER — Telehealth: Payer: Self-pay | Admitting: Gastroenterology

## 2015-12-23 ENCOUNTER — Encounter: Payer: Self-pay | Admitting: Physician Assistant

## 2015-12-23 ENCOUNTER — Other Ambulatory Visit: Payer: Self-pay

## 2015-12-23 DIAGNOSIS — K760 Fatty (change of) liver, not elsewhere classified: Secondary | ICD-10-CM | POA: Insufficient documentation

## 2015-12-23 MED ORDER — ATORVASTATIN CALCIUM 40 MG PO TABS: 40.0000 mg | ORAL_TABLET | Freq: Every day | ORAL | 3 refills | Status: DC

## 2015-12-23 NOTE — Telephone Encounter (Signed)
Her report is in. I did tell her "fatty liver". She is c/o "swelling so much it is hard to breathe" in the evenings. She is referring to abdominal swelling. She has not resumed any of her medications. Please advise. Thanks.

## 2015-12-26 NOTE — Telephone Encounter (Signed)
Called patient discussed results and advised her to restart statin

## 2016-01-28 ENCOUNTER — Other Ambulatory Visit: Payer: Self-pay

## 2016-01-28 ENCOUNTER — Telehealth: Payer: Self-pay | Admitting: Gastroenterology

## 2016-01-28 DIAGNOSIS — R945 Abnormal results of liver function studies: Secondary | ICD-10-CM

## 2016-01-28 NOTE — Telephone Encounter (Signed)
Discuss the timing of repeat hepatic function. She last had this drawn 12/18/15. She is to have this rechecked 4 to 6 weeks after that which means this week or next week. She agrees to this plan.

## 2016-01-30 DIAGNOSIS — M542 Cervicalgia: Secondary | ICD-10-CM | POA: Diagnosis not present

## 2016-01-30 DIAGNOSIS — M50322 Other cervical disc degeneration at C5-C6 level: Secondary | ICD-10-CM | POA: Diagnosis not present

## 2016-02-05 ENCOUNTER — Other Ambulatory Visit (INDEPENDENT_AMBULATORY_CARE_PROVIDER_SITE_OTHER): Payer: BLUE CROSS/BLUE SHIELD

## 2016-02-05 DIAGNOSIS — K7689 Other specified diseases of liver: Secondary | ICD-10-CM | POA: Diagnosis not present

## 2016-02-05 DIAGNOSIS — R945 Abnormal results of liver function studies: Secondary | ICD-10-CM

## 2016-02-05 LAB — HEPATIC FUNCTION PANEL
ALBUMIN: 4.3 g/dL (ref 3.5–5.2)
ALK PHOS: 115 U/L (ref 39–117)
ALT: 35 U/L (ref 0–35)
AST: 26 U/L (ref 0–37)
Bilirubin, Direct: 0.1 mg/dL (ref 0.0–0.3)
Total Bilirubin: 0.5 mg/dL (ref 0.2–1.2)
Total Protein: 6.6 g/dL (ref 6.0–8.3)

## 2016-02-26 ENCOUNTER — Encounter: Payer: Self-pay | Admitting: Gastroenterology

## 2016-02-26 ENCOUNTER — Encounter (INDEPENDENT_AMBULATORY_CARE_PROVIDER_SITE_OTHER): Payer: Self-pay

## 2016-02-26 ENCOUNTER — Other Ambulatory Visit (INDEPENDENT_AMBULATORY_CARE_PROVIDER_SITE_OTHER): Payer: BLUE CROSS/BLUE SHIELD

## 2016-02-26 ENCOUNTER — Ambulatory Visit (INDEPENDENT_AMBULATORY_CARE_PROVIDER_SITE_OTHER): Payer: BLUE CROSS/BLUE SHIELD | Admitting: Gastroenterology

## 2016-02-26 VITALS — BP 110/60 | HR 76 | Ht 63.0 in | Wt 136.0 lb

## 2016-02-26 DIAGNOSIS — R252 Cramp and spasm: Secondary | ICD-10-CM

## 2016-02-26 DIAGNOSIS — K50918 Crohn's disease, unspecified, with other complication: Secondary | ICD-10-CM

## 2016-02-26 DIAGNOSIS — K760 Fatty (change of) liver, not elsewhere classified: Secondary | ICD-10-CM | POA: Diagnosis not present

## 2016-02-26 DIAGNOSIS — K5909 Other constipation: Secondary | ICD-10-CM | POA: Diagnosis not present

## 2016-02-26 LAB — CBC WITH DIFFERENTIAL/PLATELET
BASOS PCT: 0.3 % (ref 0.0–3.0)
Basophils Absolute: 0 10*3/uL (ref 0.0–0.1)
EOS ABS: 0.1 10*3/uL (ref 0.0–0.7)
EOS PCT: 1.4 % (ref 0.0–5.0)
HEMATOCRIT: 38.1 % (ref 36.0–46.0)
HEMOGLOBIN: 13.1 g/dL (ref 12.0–15.0)
LYMPHS PCT: 24.1 % (ref 12.0–46.0)
Lymphs Abs: 1.8 10*3/uL (ref 0.7–4.0)
MCHC: 34.5 g/dL (ref 30.0–36.0)
MCV: 92.9 fl (ref 78.0–100.0)
MONOS PCT: 9.5 % (ref 3.0–12.0)
Monocytes Absolute: 0.7 10*3/uL (ref 0.1–1.0)
NEUTROS ABS: 5 10*3/uL (ref 1.4–7.7)
Neutrophils Relative %: 64.7 % (ref 43.0–77.0)
PLATELETS: 275 10*3/uL (ref 150.0–400.0)
RBC: 4.1 Mil/uL (ref 3.87–5.11)
RDW: 12.8 % (ref 11.5–15.5)
WBC: 7.7 10*3/uL (ref 4.0–10.5)

## 2016-02-26 LAB — COMPREHENSIVE METABOLIC PANEL
ALK PHOS: 134 U/L — AB (ref 39–117)
ALT: 35 U/L (ref 0–35)
AST: 20 U/L (ref 0–37)
Albumin: 4 g/dL (ref 3.5–5.2)
BUN: 13 mg/dL (ref 6–23)
CALCIUM: 8.8 mg/dL (ref 8.4–10.5)
CO2: 31 meq/L (ref 19–32)
Chloride: 103 mEq/L (ref 96–112)
Creatinine, Ser: 0.58 mg/dL (ref 0.40–1.20)
GFR: 113.6 mL/min (ref >60.00)
GLUCOSE: 193 mg/dL — AB (ref 70–99)
POTASSIUM: 4.5 meq/L (ref 3.5–5.1)
Sodium: 140 mEq/L (ref 135–145)
Total Bilirubin: 0.3 mg/dL (ref 0.2–1.2)
Total Protein: 6.4 g/dL (ref 6.0–8.3)

## 2016-02-26 LAB — SEDIMENTATION RATE: Sed Rate: 2 mm/hr (ref 0–30)

## 2016-02-26 LAB — FOLATE: FOLATE: 14.4 ng/mL (ref >5.9)

## 2016-02-26 LAB — VITAMIN B12: VITAMIN B 12: 1096 pg/mL — AB (ref 211–911)

## 2016-02-26 LAB — HIGH SENSITIVITY CRP: CRP, High Sensitivity: 1.41 mg/L (ref 0.000–5.000)

## 2016-02-26 LAB — FERRITIN: FERRITIN: 82 ng/mL (ref 10.0–291.0)

## 2016-02-26 LAB — PHOSPHORUS: PHOSPHORUS: 3.9 mg/dL (ref 2.3–4.6)

## 2016-02-26 MED ORDER — POLYETHYLENE GLYCOL 3350 17 G PO PACK: 17.0000 g | PACK | Freq: Every day | ORAL | 6 refills | Status: DC

## 2016-02-26 NOTE — Progress Notes (Signed)
[name]    [id number]    [birth year]  Primary Care Physician:Chelle Lloyd Huger  Referring Physician: Harrison Mons, PA-C [address] [place], [place] [address]  Chief complaint:  Crohn's disease HPI: 55 year old white female with Crohn's ileitis since 1991 with neo terminal ileum mucosa appeared normal except for few inflammatory appearing nodules, biopsies were obtained which did not show any active inflammation on colonoscopy in January 2017 while she was on maintenance dose of 6MP 82m daily. She has been on intermittent doses of prednisone taper for occasional Crohn's flare in the past. She is off 6MP since June for abnormal LFT.  She has been having elevated transaminases since May 2017, peaked AST 68 and ALT 141 (10/06/15), most recent LFT on 02/05/16 within normal range. She was restarted on Lipitor in July 2017 after was held for 2 months.  Denies any nausea, vomiting, abdominal pain, melena or bright red blood per rectum. She is having 1-2 formed bowel movements a day and thinks some days more hard. She also has intermittent RLQ pain about once a month transient. ROS c/o muscle weakness and muscle cramps, wakes her up from sleep at times. She works outdoors most of the day, does try to drink plenty of water    Outpatient Encounter Prescriptions as of 02/26/2016  Medication Sig  . aspirin 81 MG chewable tablet Chew 81 mg by mouth daily.    .Marland Kitchenatorvastatin (LIPITOR) 40 MG tablet Take 1 tablet (40 mg total) by mouth daily.  . celecoxib (CELEBREX) 200 MG capsule TAKE 1 CAPSULE BY MOUTH TWICE DAILY  . colestipol (COLESTID) 1 g tablet Take 1 tablet (1 g total) by mouth 2 (two) times daily.  . cyclobenzaprine (FLEXERIL) 10 MG tablet Take 0.5-1 tablets (5-10 mg total) by mouth 3 (three) times daily as needed for muscle spasms.  . enalapril (VASOTEC) 5 MG tablet Take 1 tablet (5 mg total) by mouth daily.  .Marland KitchenEPIPEN 2-PAK 0.3 MG/0.3ML SOAJ injection Reported on 06/27/2015    . fexofenadine (ALLEGRA) 180 MG tablet Take 1 tablet (180 mg total) by mouth daily.  .Marland Kitchenlevocetirizine (XYZAL) 5 MG tablet Take 5 mg by mouth every evening.  . metFORMIN (GLUCOPHAGE) 500 MG tablet Take 1 tablet (500 mg total) by mouth 2 (two) times daily with a meal.  . montelukast (SINGULAIR) 10 MG tablet Take 10 mg by mouth at bedtime.  .Marland KitchenNEXIUM 40 MG capsule TAKE 1 CAPSULE BY MOUTH ONCE DAILY  . zolpidem (AMBIEN) 10 MG tablet Take 10 mg by mouth at bedtime as needed for sleep.   . polyethylene glycol (MIRALAX / GLYCOLAX) packet Take 17 g by mouth daily.  . [DISCONTINUED] Dietary Management Product (VSL#3) PACK 900 billion packet by mouth daily   No facility-administered encounter medications on file as of 02/26/2016.     Allergies as of 02/26/2016 - Review Complete 02/26/2016  Allergen Reaction Noted  . Atorvastatin Other (See Comments) 03/28/2013    Past Medical History:  Diagnosis Date  . Allergic rhinitis due to pollen   . Anxiety state, unspecified   . Arthritis   . Bronchitis    hx  . Calculus of gallbladder without mention of cholecystitis or obstruction   . Crohn disease ([place]   . Depressive disorder, not elsewhere classified   . Diabetes mellitus without complication (HCC)    diet controlled  . Esophageal reflux   . Fibromyalgia   . Headache   . Myalgia  and myositis, unspecified   . Other and unspecified hyperlipidemia   . Palpitations   . Pneumonia 15   hx  . Unspecified essential hypertension   . Vitamin B deficiency     Past Surgical History:  Procedure Laterality Date  . CHOLECYSTECTOMY N/A 09/19/2014   Procedure: LAPAROSCOPIC CHOLECYSTECTOMY WITH INTRAOPERATIVE CHOLANGIOGRAM;  Surgeon: Donnie Mesa, MD;  Location: [facility];  Service: General;  Laterality: N/A;  . ESOPHAGOGASTRODUODENOSCOPY    . EXPLORATORY LAPAROTOMY     for endometriosis  . TONSILLECTOMY AND ADENOIDECTOMY    . TUBAL LIGATION     BILATERAL    Family History  Problem Relation Age of  Onset  . Lung cancer Maternal Grandmother   . Stroke Father   . Hypertension Mother   . Heart attack Mother   . Heart disease Mother   . Hypertension Brother   . Heart disease Brother   . Hypertension Sister   . Heart disease Sister   . Coronary artery disease Brother   . Colon cancer Neg Hx     Social History   Social History  . Marital status: Married    Spouse name: Dane  . Number of children: 2  . Years of education: 12+   Occupational History  . 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 [employer]   Social History Main Topics  . Smoking status: Former Smoker    Packs/day: 1.00    Years: 35.00    Types: Cigarettes    Quit date: 05/31/2004  . Smokeless tobacco: Never Used  . Alcohol use No  . Drug use: No  . Sexual activity: No     Comment: married   Other Topics Concern  . Not on file   Social History Narrative   Lives with her husband and their pets. Her children are adults and live independently.      Review of systems: Review of Systems  Constitutional: Negative for fever and chills.  positive for lack of energy HENT: Positive for sinus problem Eyes: Negative for blurred vision.  Respiratory: Negative for cough, shortness of breath and wheezing.   Cardiovascular: Negative for chest pain and palpitations.  Gastrointestinal: as per HPI Genitourinary: Negative for dysuria, urgency, frequency and hematuria.  Musculoskeletal: Positive for myalgias, back pain and joint pain.  Skin: Negative for itching and rash.  Neurological: Negative for dizziness, tremors, focal weakness, seizures and loss of consciousness.  Endo/Heme/Allergies: Positive for seasonal allergies.  Psychiatric/Behavioral: Negative for depression, suicidal ideas and hallucinations.  All other systems reviewed and are negative.   Physical Exam: Vitals:   02/26/16 1051  BP: 110/60  Pulse: 76   Body mass index is 24.09 kg/m. Gen:      No acute  distress HEENT:  EOMI, sclera anicteric Neck:     No masses; no thyromegaly Lungs:    Clear to auscultation bilaterally; normal respiratory effort CV:         Regular rate and rhythm; no murmurs Abd:      + bowel sounds; soft, non-tender; no palpable masses, no distension Ext:    No edema; adequate peripheral perfusion Skin:      Warm and dry; no rash Neuro: alert and oriented x 3 Psych: normal mood and affect  Data Reviewed:  Reviewed labs, radiology imaging, old records and pertinent past GI work up   Assessment and Plan/Recommendations:  55 year old female with history of Crohn's disease diagnosed in 1991 status post terminal ileal resection with neoterminal ileum,  off of 6-MP since June 2017 for abdominal LFT here for follow-up visit Most recent LFT 4 weeks ago within normal limits Recheck LFT  Crohn's disease: currently has minimal symptoms with intermittent right lower quadrant pain We'll obtain CBC, , CRP, ESR, ferritin and B12 folate level Hold off restarting 6-MP Avoid high-fiber diet  Constipation: Maintain adequate hydration Start MiraLAX half to one capful as needed  Muscle cramp and weakness could be related to electrolyte disturbance, 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 She had multiple questions which were answered to her satisfaction  K. Denzil Magnuson , MD [phone] Mon-Fri 8a-5p [phone] after 5p, weekends, holidays  CC: Harrison Mons, PA-C

## 2016-02-26 NOTE — Patient Instructions (Addendum)
Go to the basement for labs today  Avoid Fiber  (Low Fiber Diet )  Use Miralax 1 capful as needed

## 2016-02-27 LAB — MAGNESIUM: Magnesium: 1.7 mg/dL (ref 1.5–2.5)

## 2016-03-17 ENCOUNTER — Encounter: Payer: Self-pay | Admitting: Physician Assistant

## 2016-03-19 ENCOUNTER — Encounter: Payer: Self-pay | Admitting: Physician Assistant

## 2016-03-19 ENCOUNTER — Ambulatory Visit (INDEPENDENT_AMBULATORY_CARE_PROVIDER_SITE_OTHER): Payer: BLUE CROSS/BLUE SHIELD | Admitting: Physician Assistant

## 2016-03-19 ENCOUNTER — Encounter (INDEPENDENT_AMBULATORY_CARE_PROVIDER_SITE_OTHER): Payer: Self-pay

## 2016-03-19 VITALS — BP 122/78 | HR 83 | Ht 63.0 in | Wt 137.0 lb

## 2016-03-19 DIAGNOSIS — E7800 Pure hypercholesterolemia, unspecified: Secondary | ICD-10-CM

## 2016-03-19 DIAGNOSIS — I2510 Atherosclerotic heart disease of native coronary artery without angina pectoris: Secondary | ICD-10-CM

## 2016-03-19 DIAGNOSIS — R0602 Shortness of breath: Secondary | ICD-10-CM | POA: Diagnosis not present

## 2016-03-19 DIAGNOSIS — R252 Cramp and spasm: Secondary | ICD-10-CM

## 2016-03-19 LAB — BASIC METABOLIC PANEL
BUN: 15 mg/dL (ref 7–25)
CO2: 25 mmol/L (ref 20–31)
Calcium: 9.2 mg/dL (ref 8.6–10.4)
Chloride: 102 mmol/L (ref 98–110)
Creat: 0.64 mg/dL (ref 0.50–1.05)
Glucose, Bld: 210 mg/dL — ABNORMAL HIGH (ref 65–99)
POTASSIUM: 3.9 mmol/L (ref 3.5–5.3)
SODIUM: 140 mmol/L (ref 135–146)

## 2016-03-19 LAB — MAGNESIUM: MAGNESIUM: 1.6 mg/dL (ref 1.5–2.5)

## 2016-03-19 NOTE — Progress Notes (Signed)
Cardiology Office Note:    Date:  03/19/2016   ID:  [name], DOB 1961-04-30, MRN [id number]  PCP:  Leonides Sake, MD  Cardiologist:  Dr. Loralie Champagne   Electrophysiologist:  N/a GI:  Dr. Silverio Decamp  Referring MD: none    Chief Complaint  Patient presents with  . Shortness of Breath    History of Present Illness:    [name] is a 55 y.o. female with a hx of Crohn's disease, GERD, HL, palpitations, non-obstructive CAD and strong FHx of CAD.    ETT-myoview in 3/11 showed no evidence for ischemia or infarction and normal EF.  Recurrent chest pain led to a coronary CT angiogram which showed no obstructive CAD but calcium score put her in a high risk category (87th percentile).  Holter monitor in 2011 showed occasional PVCs.  Last seen by Dr. Loralie Champagne in 1/16.  She returns for evaluation of dyspnea on exertion and decreased exercise tolerance.  She also notes diffuse muscle cramping.  She denies loss of balance of stiffness.  She notes continued palpitations without significant changes.  She denies chest pain but has a strange sensation in her chest with her palpitations.  She denies orthopnea, PND, edema.  She denies syncope or near syncope.  She has a chronic cough.  She has some episodes of nausea but no vomiting or diarrhea.  She denies any bleeding issues.     Prior CV studies that were reviewed today include:    Coronary CTA 11/12 Left Main: No plaque or stenosis Left Anterior Descending: Mild nonobstructive mixed plaque in the mid LAD. Left Circumflex: Moderate sized ramus with no plaque or stenosis. The CFX itself with a small vessel with no plaque or stenosis. Right Coronary Artery: Dominant vessel, no plaque or stenosis. Coronary Calcium Score: 7 Agatston units IMPRESSION: 1. No obstructive coronary disease.  There was mild nonobstructive plaque in the mid LAD. 2. Coronary calcium score of 4 Agatston units places the patient in the Maddock percentile for her age  and gender.  This is a high risk category for future cardiac events.  I would suggest ASA 81 mg daily and aggressive lowering of LDL cholesterol to ideally < 70 but at least < 100.  Myoview 3/11 EF 67, no ischemia  Past Medical History:  Diagnosis Date  . Allergic rhinitis due to pollen   . Anxiety state, unspecified   . Arthritis   . Bronchitis    hx  . Calculus of gallbladder without mention of cholecystitis or obstruction   . Crohn disease ([place])   . Depressive disorder, not elsewhere classified   . Diabetes mellitus without complication (HCC)    diet controlled  . Esophageal reflux   . Fibromyalgia   . Headache   . Myalgia and myositis, unspecified   . Other and unspecified hyperlipidemia   . Palpitations   . Pneumonia 15   hx  . Unspecified essential hypertension   . Vitamin B deficiency   1. B12 DEFICIENCY  2. ANXIETY  3. Type II diabetes: diet-controlled.   4. DEPRESSION 5. HYPERLIPIDEMIA  6. GERD  7. CHOLELITHIASIS  8. CROHN'S DISEASE: Followed by Dr. Olevia Perches  9. FIBROMYALGIA   10. ALLERGIC RHINITIS  11. CAD: ETT-myoview (3/11) with EF 67%, hypertensive BP response (234/58), normal perfusion images.  Coronary CT angiogram in 11/12 showed mild nonobstructive plaque in the LAD, calcium score placed her in the 87th percentile.  12. Chronic low back pain  13. Palpitations: Holter (3/11)  showed occasional PVCs.  14. HTN   Past Surgical History:  Procedure Laterality Date  . CHOLECYSTECTOMY N/A 09/19/2014   Procedure: LAPAROSCOPIC CHOLECYSTECTOMY WITH INTRAOPERATIVE CHOLANGIOGRAM;  Surgeon: Donnie Mesa, MD;  Location: [facility];  Service: General;  Laterality: N/A;  . ESOPHAGOGASTRODUODENOSCOPY    . EXPLORATORY LAPAROTOMY     for endometriosis  . TONSILLECTOMY AND ADENOIDECTOMY    . TUBAL LIGATION     BILATERAL    Current Medications: Current Meds  Medication Sig  . aspirin 81 MG chewable tablet Chew 81 mg by mouth daily.    Marland Kitchen atorvastatin (LIPITOR) 40 MG  tablet Take 1 tablet (40 mg total) by mouth daily.  . celecoxib (CELEBREX) 200 MG capsule TAKE 1 CAPSULE BY MOUTH TWICE DAILY  . colestipol (COLESTID) 1 g tablet Take 1 tablet (1 g total) by mouth 2 (two) times daily.  . cyclobenzaprine (FLEXERIL) 10 MG tablet Take 0.5-1 tablets (5-10 mg total) by mouth 3 (three) times daily as needed for muscle spasms.  . enalapril (VASOTEC) 5 MG tablet Take 1 tablet (5 mg total) by mouth daily.  Marland Kitchen EPIPEN 2-PAK 0.3 MG/0.3ML SOAJ injection Reported on 06/27/2015  . fexofenadine (ALLEGRA) 180 MG tablet Take 1 tablet (180 mg total) by mouth daily.  Marland Kitchen levocetirizine (XYZAL) 5 MG tablet Take 5 mg by mouth every evening.  . metFORMIN (GLUCOPHAGE) 500 MG tablet Take 1 tablet (500 mg total) by mouth 2 (two) times daily with a meal.  . montelukast (SINGULAIR) 10 MG tablet Take 10 mg by mouth at bedtime.  Marland Kitchen NEXIUM 40 MG capsule TAKE 1 CAPSULE BY MOUTH ONCE DAILY  . zolpidem (AMBIEN) 10 MG tablet Take 10 mg by mouth at bedtime as needed for sleep.      Allergies:   Atorvastatin   Social History   Social History  . Marital status: Married    Spouse name: Dane  . Number of children: 2  . Years of education: 12+   Occupational History  . 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  [employer]   Social History Main Topics  . Smoking status: Former Smoker    Packs/day: 1.00    Years: 35.00    Types: Cigarettes    Quit date: 05/31/2004  . Smokeless tobacco: Never Used  . Alcohol use No  . Drug use: No  . Sexual activity: No     Comment: married   Other Topics Concern  . Not on file   Social History Narrative   Lives with her husband and their pets. Her children are adults and live independently.     Family History:  The patient's family history includes Coronary artery disease in her brother; Heart attack in her mother; Heart disease in her brother, mother, and sister; Hypertension in her brother, mother, and sister; Lung  cancer in her maternal grandmother; Stroke in her father.   ROS:   Please see the history of present illness.    Review of Systems  Constitution: Positive for diaphoresis and malaise/fatigue.  HENT: Positive for headaches.   Cardiovascular: Positive for irregular heartbeat.  Musculoskeletal: Positive for back pain, joint pain and myalgias.  Gastrointestinal: Positive for abdominal pain and nausea.   All other systems reviewed and are negative.   EKGs/Labs/Other Test Reviewed:    EKG:  EKG is  ordered today.  The ekg ordered today demonstrates NSR, HR 83, rightward axis, NSSTTW changes, TW changes inferolaterally are somewhat more prominent on the current tracing, QTc 455  ms  Recent Labs: 02/26/2016: ALT 35; BUN 13; Creatinine, Ser 0.58; Hemoglobin 13.1; Magnesium 1.7; Platelets 275.0; Potassium 4.5; Sodium 140   Recent Lipid Panel    Component Value Date/Time   CHOL 247 (H) 12/18/2015 1003   TRIG (H) 12/18/2015 1003    946.0 Triglyceride is over 400; calculations on Lipids are invalid.   HDL 34.10 (L) 12/18/2015 1003   CHOLHDL 7 12/18/2015 1003   VLDL 53.6 (H) 08/15/2014 0734   LDLCALC 127 08/27/2013   LDLDIRECT 85.0 12/18/2015 1003     Physical Exam:    VS:  BP 122/78   Pulse 83   Ht 5' 3"  (1.6 m)   Wt 137 lb (62.1 kg)   LMP  (LMP Unknown)   SpO2 97%   BMI 24.27 kg/m     Wt Readings from Last 3 Encounters:  03/19/16 137 lb (62.1 kg)  02/26/16 136 lb (61.7 kg)  12/18/15 134 lb (60.8 kg)     Physical Exam  Constitutional: She is oriented to person, place, and time. She appears well-developed and well-nourished. No distress.  HENT:  Head: Normocephalic and atraumatic.  Eyes: No scleral icterus.  Neck: No JVD present.  Cardiovascular: Normal rate, regular rhythm and normal heart sounds.   No murmur heard. Pulmonary/Chest: Effort normal. She has no wheezes. She has no rales.  Abdominal: Soft. There is no tenderness.  Musculoskeletal: She exhibits no edema.    Neurological: She is alert and oriented to person, place, and time.  Skin: Skin is warm and dry.  Psychiatric: She has a normal mood and affect.    ASSESSMENT:    1. Coronary artery disease involving native coronary artery of native heart without angina pectoris   2. Shortness of breath   3. Leg cramps   4. Pure hypercholesterolemia    PLAN:    In order of problems listed above:  1. CAD - No obs CAD on CTA in 2012, but Ca score was 4.  She denies chest pain.  However, she does note decreased exercise tolerance, fatigue and +/- shortness of breath.  With her DM and strong FHx of CAD, will proceed with stress testing.    -  Continue ASA, statin  -  ETT-Myoview  -  FU 1 month.  2. Dyspnea - Etiology not clear.  She has more decreased exercise tolerance than dyspnea.  Will get Myoview as noted.  O2 sats normal.  No evidence of CHF on exam.  3. Leg Cramps - Etiology not clear.  She had recent Mg2+, K+ normal.  Celebrex is new.  She is eating a lot of K+ in her diet now.  -  Hold Celebrex x 2 weeks.  -  If no better, try holding Lipitor x 2 weeks  -  Repeat BMET, Mg2+ today  -  If workup unrevealing, she will need FU with PCP.  4. HL - Continue statin.  Arrange Lipids.  If she has to stop statin, consider referral to [facility].    Medication Adjustments/Labs and Tests Ordered: Current medicines are reviewed at length with the patient today.  Concerns regarding medicines are outlined above.  Medication changes, Labs and Tests ordered today are outlined in the Patient Instructions noted below. Patient Instructions  Medication Instructions:  1. HOLD CELEBREX FOR 2 WEEKS; IF LEG CRAMPS DO NOT IMPROVE OFF THE CELEBREX THEN RESUME CELEBREX 2. IF YOU DO RESUME THE CELEBREX THEN HOLD THE LIPITOR FOR 2 WEEKS; IF LEG  CRAMPS ARE NO BETTER  THEN RESUME LIPITOR  Labwork: 1. TODAY BMET, MAGNESIUM LEVEL 2. YOU WILL NEED TO COME IN FASTING ONE DAY TO HAVE A LIPID PANEL DONE    Testing/Procedures: 1. Your physician has requested that you have en exercise stress myoview. For further information please visit [email or web address]. Please follow instruction sheet, as given. OK PER SCOTT W. PAC TO CHANGE TO LEXISCAN IF PT UNABLE TO WALK  Follow-Up: SCOTT WEAVER, PAC IN 1 MONTH  Any Other Special Instructions Will Be Listed Below (If Applicable).  If you need a refill on your cardiac medications before your next appointment, please call your pharmacy.  Signed, Richardson Dopp, PA-C  03/19/2016 1:47 PM    [facility] [address], [place], [place]  [address] Phone: [phone]; Fax: [phone]

## 2016-03-19 NOTE — Patient Instructions (Addendum)
Medication Instructions:  1. HOLD CELEBREX FOR 2 WEEKS; IF LEG CRAMPS DO NOT IMPROVE OFF THE CELEBREX THEN RESUME CELEBREX 2. IF YOU DO RESUME THE CELEBREX THEN HOLD THE LIPITOR FOR 2 WEEKS; IF LEG  CRAMPS ARE NO BETTER THEN RESUME LIPITOR  Labwork: 1. TODAY BMET, MAGNESIUM LEVEL 2. YOU WILL NEED TO COME IN FASTING ONE DAY TO HAVE A LIPID PANEL DONE   Testing/Procedures: 1. Your physician has requested that you have en exercise stress myoview. For further information please visit [email or web address]. Please follow instruction sheet, as given. OK PER SCOTT W. PAC TO CHANGE TO LEXISCAN IF PT UNABLE TO WALK  Follow-Up: SCOTT WEAVER, PAC IN 1 MONTH  Any Other Special Instructions Will Be Listed Below (If Applicable).  If you need a refill on your cardiac medications before your next appointment, please call your pharmacy.

## 2016-03-22 ENCOUNTER — Telehealth: Payer: Self-pay | Admitting: *Deleted

## 2016-03-22 DIAGNOSIS — I2510 Atherosclerotic heart disease of native coronary artery without angina pectoris: Secondary | ICD-10-CM

## 2016-03-22 DIAGNOSIS — E7800 Pure hypercholesterolemia, unspecified: Secondary | ICD-10-CM

## 2016-03-22 NOTE — Telephone Encounter (Signed)
Pt aware to hold Lipitor x 2 weeks and call after 2 weeks to let us know if leg cramps are better. Pt agreeable to this plan. Pt asked to have Lipid panel doe at PCP on Thursday. I will fax order over to PCP tomorrow.

## 2016-03-22 NOTE — Telephone Encounter (Signed)
Pt notified of lab results and findings by phone with verbal understanding. Pt wanted to let us know that when she got home after her OV 10/20 with Brynda Rim. PA she realized she was not taking Celebrex already. I have corrected her med list to reflect this . Pt does state she then proceeded to stop the Lipitor as advised and she has found that the leg cramps have subsided. Pt was also advised to try and increase magnesium in her diet to help with leg cramps, pt aware her magnesium level is normal however PA feels if level closer to 2 may help leg cramps. Pt agreeable to plan of care. I advised pt that I will d/w PA that being off the Lipitor has helped the leg cramps. Pt aware I will call her back with recommendations from PA. Pt said thank you.

## 2016-03-23 ENCOUNTER — Other Ambulatory Visit: Payer: Self-pay | Admitting: *Deleted

## 2016-03-23 DIAGNOSIS — I2510 Atherosclerotic heart disease of native coronary artery without angina pectoris: Secondary | ICD-10-CM

## 2016-03-23 DIAGNOSIS — E7800 Pure hypercholesterolemia, unspecified: Secondary | ICD-10-CM

## 2016-03-23 NOTE — Addendum Note (Signed)
Addended by: Michae Kava on: 03/23/2016 11:17 AM   Modules accepted: Orders

## 2016-03-25 ENCOUNTER — Encounter: Payer: Self-pay | Admitting: Physician Assistant

## 2016-03-25 ENCOUNTER — Telehealth (HOSPITAL_COMMUNITY): Payer: Self-pay | Admitting: *Deleted

## 2016-03-25 NOTE — Telephone Encounter (Signed)
Patient given detailed instructions per Myocardial Perfusion Study Information Sheet for the test on 03/29/16 at 0745. Patient notified to arrive 15 minutes early and that it is imperative to arrive on time for appointment to keep from having the test rescheduled.  If you need to cancel or reschedule your appointment, please call the office within 24 hours of your appointment. Failure to do so may result in a cancellation of your appointment, and a $50 no show fee. Patient verbalized understanding.Jesika Men, Ranae Palms

## 2016-03-26 ENCOUNTER — Telehealth: Payer: Self-pay | Admitting: *Deleted

## 2016-03-26 DIAGNOSIS — E7800 Pure hypercholesterolemia, unspecified: Secondary | ICD-10-CM

## 2016-03-26 DIAGNOSIS — I2510 Atherosclerotic heart disease of native coronary artery without angina pectoris: Secondary | ICD-10-CM

## 2016-03-26 NOTE — Telephone Encounter (Signed)
Pt notified of lab results and findings by phone. Pt is agreeable to referral to Lipid Clinic . I will have [facility] call the pt to schedule.

## 2016-03-29 ENCOUNTER — Encounter: Payer: Self-pay | Admitting: Physician Assistant

## 2016-03-29 ENCOUNTER — Other Ambulatory Visit: Payer: BLUE CROSS/BLUE SHIELD

## 2016-03-29 ENCOUNTER — Ambulatory Visit (HOSPITAL_COMMUNITY): Payer: BLUE CROSS/BLUE SHIELD | Attending: Physician Assistant

## 2016-03-29 ENCOUNTER — Encounter (INDEPENDENT_AMBULATORY_CARE_PROVIDER_SITE_OTHER): Payer: Self-pay

## 2016-03-29 DIAGNOSIS — R0602 Shortness of breath: Secondary | ICD-10-CM

## 2016-03-29 DIAGNOSIS — E119 Type 2 diabetes mellitus without complications: Secondary | ICD-10-CM | POA: Diagnosis not present

## 2016-03-29 DIAGNOSIS — R002 Palpitations: Secondary | ICD-10-CM | POA: Diagnosis not present

## 2016-03-29 DIAGNOSIS — I2510 Atherosclerotic heart disease of native coronary artery without angina pectoris: Secondary | ICD-10-CM | POA: Diagnosis not present

## 2016-03-29 DIAGNOSIS — R0609 Other forms of dyspnea: Secondary | ICD-10-CM | POA: Diagnosis not present

## 2016-03-29 DIAGNOSIS — R079 Chest pain, unspecified: Secondary | ICD-10-CM | POA: Diagnosis not present

## 2016-03-29 LAB — MYOCARDIAL PERFUSION IMAGING
CHL CUP RESTING HR STRESS: 81 {beats}/min
CSEPED: 6 min
Estimated workload: 7 METS
Exercise duration (sec): 0 s
LV sys vol: 32 mL
LVDIAVOL: 71 mL (ref 46–106)
MPHR: 163 {beats}/min
Peak HR: 141 {beats}/min
Percent HR: 86 %
RATE: 0.25
SDS: 1
SRS: 2
SSS: 3
TID: 0.9

## 2016-03-29 MED ORDER — TECHNETIUM TC 99M TETROFOSMIN IV KIT
10.9000 | PACK | Freq: Once | INTRAVENOUS | Status: AC | PRN
  Administered 2016-03-29: 10.9 via INTRAVENOUS
  Filled 2016-03-29: qty 11

## 2016-03-29 MED ORDER — TECHNETIUM TC 99M TETROFOSMIN IV KIT
32.6000 | PACK | Freq: Once | INTRAVENOUS | Status: AC | PRN
Start: 2016-03-29 — End: 2016-03-29
  Administered 2016-03-29: 32.6 via INTRAVENOUS
  Filled 2016-03-29: qty 33

## 2016-03-30 ENCOUNTER — Telehealth: Payer: Self-pay | Admitting: *Deleted

## 2016-03-30 NOTE — Telephone Encounter (Signed)
Pt notified of lab results by phone with verbal understanding. Pt confirmed her appt with the Lipid Clinic 11/9.Marland KitchenMarland Kitchen

## 2016-04-08 ENCOUNTER — Ambulatory Visit (INDEPENDENT_AMBULATORY_CARE_PROVIDER_SITE_OTHER): Payer: BLUE CROSS/BLUE SHIELD | Admitting: Pharmacist

## 2016-04-08 DIAGNOSIS — E785 Hyperlipidemia, unspecified: Secondary | ICD-10-CM

## 2016-04-08 MED ORDER — FENOFIBRATE 145 MG PO TABS: 145.0000 mg | ORAL_TABLET | Freq: Every day | ORAL | 3 refills | Status: DC

## 2016-04-08 MED ORDER — ROSUVASTATIN CALCIUM 10 MG PO TABS: 10.0000 mg | ORAL_TABLET | Freq: Every day | ORAL | 3 refills | Status: DC

## 2016-04-08 NOTE — Progress Notes (Signed)
Patient ID: [name]                 DOB: [birth year]                    MRN: [id number]     HPI: [name] is a 55 y.o. female patient of Dr. Aundra Dubin referred to lipid clinic by Richardson Dopp, PA for lipid medication assessment and management. PMH of non-obstructive CAD with a calcium score of 4 which puts her in a high risk category (87th percentile). She also has a strong family history of CAD, HLD, palpitations, Crohn's disease, GERD. Patient is on Lipitor 40 mg, but it is currently being held for two weeks per the PA's instruction to assess improvement to myalgia symptoms.   Patient reports that Lipitor caused severe myalgias in her legs which restricted her movement and moved up into her back. She has not taken Lipitor since 10/23 as instructed. Symptoms have improved since discontinuing Lipitor. She reports an extensive family history of CAD as reported below. She is receptive to trying Crestor again and understand the need for another medication to help lower her TG.   Current Medications: Lipitor 40 mg daily (being held due to myalgias), Colestipol 1 g tablet BID Tried in the past: Pravastatin 80 mg (2015, tolerated, changed to Crestor), Crestor 5 mg daily (2014, says thinks she tolerated) Intolerances: Lipitor 40 mg daily (myalgias) Risk Factors: High risk category with Ca score, FamHx of CAD LDL goal: <100 mg/dL  Diet: Endorses that her diet is not to par. She eats fruit throughout the day,  Toast and breakfast burritos for breakfast. Limits cakes and sugary treats and tries to eat sugar-free items.   Exercise: She walks daily at her place of employment. She would like to increase her level of activity.   Family History: Mother (CAD, MI, decreased at 39), father (Stroke, MI, endocarditis, deceased at 32), brother (CAD, aortic aneurysm, HTN ), sister (HTN, MI, deceased at 58). Her brother's son and grandson have both suffered from strokes (34 and 57 yo respectively).   Social  History: Former smoker of cigarettes. 1 PPD for 35 years, quit on 05/31/2004. Denies alcohol and illicit drug use.   Labs from PCP (03/25/16): TC 202, TG 473, HDL 33, LDL n/c (no meds)  Past Medical History:  Diagnosis Date  . Allergic rhinitis due to pollen   . Anxiety state, unspecified   . Arthritis   . Bronchitis    hx  . Calculus of gallbladder without mention of cholecystitis or obstruction   . Crohn disease ([place])   . Depressive disorder, not elsewhere classified   . Diabetes mellitus without complication (HCC)    diet controlled  . Esophageal reflux   . Fibromyalgia   . Headache   . History of nuclear stress test    ETT-Myoview 10/17: EF 55%, normal perfusion; Low Risk  . Myalgia and myositis, unspecified   . Other and unspecified hyperlipidemia   . Palpitations   . Pneumonia 15   hx  . Unspecified essential hypertension   . Vitamin B deficiency     Current Outpatient Prescriptions on File Prior to Visit  Medication Sig Dispense Refill  . aspirin 81 MG chewable tablet Chew 81 mg by mouth daily.      Marland Kitchen atorvastatin (LIPITOR) 40 MG tablet Take 1 tablet (40 mg total) by mouth daily. 30 tablet 3  . colestipol (COLESTID) 1 g tablet Take 1 tablet (1 g  total) by mouth 2 (two) times daily. 60 tablet 3  . cyclobenzaprine (FLEXERIL) 10 MG tablet Take 0.5-1 tablets (5-10 mg total) by mouth 3 (three) times daily as needed for muscle spasms. 30 tablet 0  . enalapril (VASOTEC) 5 MG tablet Take 1 tablet (5 mg total) by mouth daily. 30 tablet 11  . EPIPEN 2-PAK 0.3 MG/0.3ML SOAJ injection Reported on 06/27/2015  0  . fexofenadine (ALLEGRA) 180 MG tablet Take 1 tablet (180 mg total) by mouth daily. 90 tablet 3  . levocetirizine (XYZAL) 5 MG tablet Take 5 mg by mouth every evening.    . metFORMIN (GLUCOPHAGE) 500 MG tablet Take 1 tablet (500 mg total) by mouth 2 (two) times daily with a meal. 180 tablet 3  . montelukast (SINGULAIR) 10 MG tablet Take 10 mg by mouth at bedtime.    Marland Kitchen NEXIUM  40 MG capsule TAKE 1 CAPSULE BY MOUTH ONCE DAILY 30 capsule 1  . zolpidem (AMBIEN) 10 MG tablet Take 10 mg by mouth at bedtime as needed for sleep.      No current facility-administered medications on file prior to visit.     Allergies  Allergen Reactions  . Atorvastatin Other (See Comments)    Elevated ALT    Assessment/Plan:  1. Hyperlipidemia - patient has non-obstructive CAD with a low calcium score of 4, but meets the criteria for high risk category (87th percentile). She also has a significant family history of CAD. She has elevated triglycerides of 473 mg/dL above the goal of <150 mg/dL and her current LDL level is unmeasurable due to elevated TG. Ideally would prefer LDL < 121m/dL for primary prevention with risk factors (DM, strong family history, elevated calcium score).To mitigate her risk of developing ASCVD and treat suspected elevated LDL, recommend statin re-initation. Will start Crestor 10 mg daily for LDL lowering and ASCVD prevention. Will also start fenofibrate 145 mg daily to lower triglycerides. Will discontinue colestipol to reduce pill burden and due to minimal efficacy. She is to obtain a follow-up lipid panel in the first week of February at a clinic near her work (her preference). Advised her on limiting carbs and encouraged her to continue doing daily physical activity.    Patient seen by SLeroy Libman PFront Royalstudent.   Takiera Mayo E. Zubayr Bednarczyk, PharmD, [id number] [place] [id number]: [phone] Fax: [phone]/01/2016 1:25 PM

## 2016-04-08 NOTE — Patient Instructions (Signed)
Stop taking Lipitor 40 mg daily. Start taking Crestor 10 mg daily for your cholesterol instead.   Begin taking fenofibrate (TriCor) 145 mg daily to lower your triglycerides.   Discontinue colestipol.   Go to lab the first week of February to obtain a lipid panel.

## 2016-04-20 ENCOUNTER — Encounter: Payer: Self-pay | Admitting: Physician Assistant

## 2016-04-20 ENCOUNTER — Encounter (INDEPENDENT_AMBULATORY_CARE_PROVIDER_SITE_OTHER): Payer: Self-pay

## 2016-04-20 ENCOUNTER — Ambulatory Visit (INDEPENDENT_AMBULATORY_CARE_PROVIDER_SITE_OTHER): Payer: BLUE CROSS/BLUE SHIELD | Admitting: Physician Assistant

## 2016-04-20 VITALS — BP 130/80 | HR 80 | Ht 63.0 in | Wt 138.0 lb

## 2016-04-20 DIAGNOSIS — E782 Mixed hyperlipidemia: Secondary | ICD-10-CM

## 2016-04-20 DIAGNOSIS — I2510 Atherosclerotic heart disease of native coronary artery without angina pectoris: Secondary | ICD-10-CM | POA: Diagnosis not present

## 2016-04-20 NOTE — Progress Notes (Signed)
That would be fine 

## 2016-04-20 NOTE — Patient Instructions (Addendum)
Medication Instructions:  Your physician recommends that you continue on your current medications as directed. Please refer to the Current Medication list given to you today.  Labwork: NONE  Testing/Procedures: NONE  Follow-Up: Your physician wants you to follow-up in: [place] DR. END. You will receive a reminder letter in the mail two months in advance. If you don't receive a letter, please call our office to schedule the follow-up appointment.  Any Other Special Instructions Will Be Listed Below (If Applicable).  If you need a refill on your cardiac medications before your next appointment, please call your pharmacy.

## 2016-04-20 NOTE — Progress Notes (Addendum)
Cardiology Office Note:    Date:  04/20/2016   ID:  [name], DOB 1961/03/20, MRN [id number]  PCP:  Leonides Sake, MD  Cardiologist:  Dr. Loralie Champagne   Electrophysiologist:  N/a GI:  Dr. Silverio Decamp  Referring MD: Leonides Sake, MD   Chief Complaint  Patient presents with  . Follow-up    CAD    History of Present Illness:    [name] is a 55 y.o. female with a hx of Crohn's disease, GERD, HL, palpitations, non-obstructive CAD and strong FHx of CAD.    ETT-myoview in 3/11 showed no evidence for ischemia or infarction and normal EF. Recurrent chest pain led to a coronary CT angiogram which showed no obstructive CAD but calcium score put her in a high risk category (87th percentile). Holter monitor in 2011 showed occasional PVCs.  Last seen by Dr. Loralie Champagne in 1/16.  I saw her last month with c/o dyspnea on exertion and decreased exercise tolerance.  She underwent an exercise Myoview.  This was low risk without ischemia and normal ejection fraction.  She returns for FU.  She is doing well. She denies chest pain. She denies significant dyspnea on exertion, syncope, orthopnea, PND, edema.  She still feels weak.  This is not any worse.  She notes significant flare up of her allergy symptoms.  She also notes more nausea and abdominal bloating.  She did feel better off of Lipitor and I had her see the [facility].  She is now on fenofibrate and Crestor.  She denies significant myalgias.    Prior CV studies that were reviewed today include:    Myoview 03/29/16  Nuclear stress EF: 55%.  There was no ST segment deviation noted during stress.  The study is normal.  This is a low risk study.  The left ventricular ejection fraction is normal (55-65%).  Coronary CTA 11/12 Left Main: No plaque or stenosis Left Anterior Descending: Mild nonobstructive mixed plaque in the mid LAD. Left Circumflex: Moderate sized ramus with no plaque or stenosis. The CFX itself with a  small vessel with no plaque or stenosis. Right Coronary Artery: Dominant vessel, no plaque or stenosis. Coronary Calcium Score: 7 Agatston units IMPRESSION: 1. No obstructive coronary disease. There was mild nonobstructive plaque in the mid LAD. 2. Coronary calcium score of 4 Agatston units places the patient in the [place] percentile for her age and gender. This is a high risk category for future cardiac events. I would suggest ASA 81 mg daily and aggressive lowering of LDL cholesterol to ideally < 70 but at least < 100.  Myoview 3/11 EF 67, no ischemia  Past Medical History:  Diagnosis Date  . Allergic rhinitis due to pollen   . Anxiety state, unspecified   . Arthritis   . Bronchitis    hx  . Calculus of gallbladder without mention of cholecystitis or obstruction   . Crohn disease ([place])   . Depressive disorder, not elsewhere classified   . Diabetes mellitus without complication (HCC)    diet controlled  . Esophageal reflux   . Fibromyalgia   . Headache   . History of nuclear stress test    ETT-Myoview 10/17: EF 55%, normal perfusion; Low Risk  . Myalgia and myositis, unspecified   . Other and unspecified hyperlipidemia   . Palpitations   . Pneumonia 15   hx  . Unspecified essential hypertension   . Vitamin B deficiency   1. B12 DEFICIENCY  2. ANXIETY  3. Type II diabetes: diet-controlled.  4. DEPRESSION 5. HYPERLIPIDEMIA  6. GERD  7. CHOLELITHIASIS  8. CROHN'S DISEASE: Followed by Dr. Olevia Perches  9. FIBROMYALGIA  10. ALLERGIC RHINITIS  11. CAD: ETT-myoview (3/11) with EF 67%, hypertensive BP response (234/58), normal perfusion images. Coronary CT angiogram in 11/12 showed mild nonobstructive plaque in the LAD, calcium score placed her in the 87th percentile.  12. Chronic low back pain  13. Palpitations: Holter (3/11) showed occasional PVCs.  14. HTN   Past Surgical History:  Procedure Laterality Date  . CHOLECYSTECTOMY N/A 09/19/2014   Procedure: LAPAROSCOPIC  CHOLECYSTECTOMY WITH INTRAOPERATIVE CHOLANGIOGRAM;  Surgeon: Donnie Mesa, MD;  Location: [facility];  Service: General;  Laterality: N/A;  . ESOPHAGOGASTRODUODENOSCOPY    . EXPLORATORY LAPAROTOMY     for endometriosis  . TONSILLECTOMY AND ADENOIDECTOMY    . TUBAL LIGATION     BILATERAL    Current Medications: Current Meds  Medication Sig  . aspirin 81 MG chewable tablet Chew 81 mg by mouth daily.    . cyclobenzaprine (FLEXERIL) 10 MG tablet Take 0.5-1 tablets (5-10 mg total) by mouth 3 (three) times daily as needed for muscle spasms.  . enalapril (VASOTEC) 5 MG tablet Take 1 tablet (5 mg total) by mouth daily.  Marland Kitchen EPIPEN 2-PAK 0.3 MG/0.3ML SOAJ injection Reported on 06/27/2015  . fenofibrate (TRICOR) 145 MG tablet Take 1 tablet (145 mg total) by mouth daily.  . fexofenadine (ALLEGRA) 180 MG tablet Take 1 tablet (180 mg total) by mouth daily.  Marland Kitchen levocetirizine (XYZAL) 5 MG tablet Take 5 mg by mouth every evening.  . metFORMIN (GLUCOPHAGE) 500 MG tablet Take 1 tablet (500 mg total) by mouth 2 (two) times daily with a meal.  . montelukast (SINGULAIR) 10 MG tablet Take 10 mg by mouth at bedtime.  Marland Kitchen NEXIUM 40 MG capsule TAKE 1 CAPSULE BY MOUTH ONCE DAILY  . rosuvastatin (CRESTOR) 10 MG tablet Take 1 tablet (10 mg total) by mouth daily.  Marland Kitchen zolpidem (AMBIEN) 10 MG tablet Take 10 mg by mouth at bedtime as needed for sleep.      Allergies:   Atorvastatin   Social History   Social History  . Marital status: Married    Spouse name: Dane  . Number of children: 2  . Years of education: 12+   Occupational History  . 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  [employer]   Social History Main Topics  . Smoking status: Former Smoker    Packs/day: 1.00    Years: 35.00    Types: Cigarettes    Quit date: 05/31/2004  . Smokeless tobacco: Never Used  . Alcohol use No  . Drug use: No  . Sexual activity: No     Comment: married   Other Topics Concern  . None     Social History Narrative   Lives with her husband and their pets. Her children are adults and live independently.     Family History:  The patient's family history includes Coronary artery disease in her brother; Heart attack in her mother; Heart disease in her brother, mother, and sister; Hypertension in her brother, mother, and sister; Lung cancer in her maternal grandmother; Stroke in her father.   ROS:   Please see the history of present illness.    Review of Systems  Constitution: Positive for diaphoresis.  Respiratory: Positive for cough and snoring.   Musculoskeletal: Positive for back pain, joint pain and myalgias.  Gastrointestinal: Positive for abdominal  pain and nausea.  Neurological: Positive for headaches.   All other systems reviewed and are negative.   EKGs/Labs/Other Test Reviewed:    EKG:  EKG is not ordered today.  The ekg ordered today demonstrates n/a  Recent Labs: 02/26/2016: ALT 35; Hemoglobin 13.1; Platelets 275.0 03/19/2016: BUN 15; Creat 0.64; Magnesium 1.6; Potassium 3.9; Sodium 140   Recent Lipid Panel    Component Value Date/Time   CHOL 247 (H) 12/18/2015 1003   TRIG (H) 12/18/2015 1003    946.0 Triglyceride is over 400; calculations on Lipids are invalid.   HDL 34.10 (L) 12/18/2015 1003   CHOLHDL 7 12/18/2015 1003   VLDL 53.6 (H) 08/15/2014 0734   LDLCALC 127 08/27/2013   LDLDIRECT 85.0 12/18/2015 1003     Physical Exam:    VS:  BP 130/80   Pulse 80   Ht 5' 3"  (1.6 m)   Wt 138 lb (62.6 kg)   LMP  (LMP Unknown)   BMI 24.45 kg/m     Wt Readings from Last 3 Encounters:  04/20/16 138 lb (62.6 kg)  03/29/16 137 lb (62.1 kg)  03/19/16 137 lb (62.1 kg)     Physical Exam  Constitutional: She is oriented to person, place, and time. She appears well-developed and well-nourished. No distress.  HENT:  Head: Normocephalic and atraumatic.  Eyes: No scleral icterus.  Neck: Normal range of motion. No JVD present.  Cardiovascular: Normal  rate, regular rhythm, S1 normal, S2 normal and normal heart sounds.   No murmur heard. Pulmonary/Chest: Breath sounds normal. She has no wheezes. She has no rhonchi. She has no rales.  Abdominal: Soft. There is no tenderness.  Musculoskeletal: She exhibits no edema.  Neurological: She is alert and oriented to person, place, and time.  Skin: Skin is warm and dry.  Psychiatric: She has a normal mood and affect.    ASSESSMENT:    1. Coronary artery disease involving native coronary artery of native heart without angina pectoris   2. Mixed hyperlipidemia    PLAN:    In order of problems listed above:  1. CAD - No obs CAD on CTA in 2012, but Ca score was 4.  Recent Myoview low risk.  No chest pain. Her fatigue may be related to her Crohn's or her allergies.  She can FU with her PCP to check a TSH.             -  Continue ASA, statin  2. HL - Continue statin, fenofibrate.   Continue follow up with Lipid clinic.   Medication Adjustments/Labs and Tests Ordered: Current medicines are reviewed at length with the patient today.  Concerns regarding medicines are outlined above.  Medication changes, Labs and Tests ordered today are outlined in the Patient Instructions noted below. Patient Instructions  Medication Instructions:  Your physician recommends that you continue on your current medications as directed. Please refer to the Current Medication list given to you today.  Labwork: NONE  Testing/Procedures: NONE  Follow-Up: Your physician wants you to follow-up in: [place] [address] END. You will receive a reminder letter in the mail two months in advance. If you don't receive a letter, please call our office to schedule the follow-up appointment.  Any Other Special Instructions Will Be Listed Below (If Applicable).  If you need a refill on your cardiac medications before your next appointment, please call your pharmacy.   Signed, Richardson Dopp, PA-C  04/20/2016 11:28 AM    [facility] [address], [place], [place]  [address] Phone: [phone]; Fax: [phone]

## 2016-05-11 ENCOUNTER — Telehealth: Payer: Self-pay | Admitting: Gastroenterology

## 2016-05-11 NOTE — Telephone Encounter (Signed)
She stopped 6MP. She is beginning to have abdominal pain , multiple diarrhea stools each day and bloating. Please advise.

## 2016-05-11 NOTE — Telephone Encounter (Signed)
Please advise patient to check C.diff, schedule office visit soon. She had abnormal LFT that improved after discontinuing 6MP. May have to consider starting biologics

## 2016-05-12 ENCOUNTER — Other Ambulatory Visit: Payer: Self-pay

## 2016-05-12 DIAGNOSIS — R197 Diarrhea, unspecified: Secondary | ICD-10-CM

## 2016-05-12 NOTE — Telephone Encounter (Signed)
Spoke with pt and she is aware, will come to lab for stool specimen. Pt scheduled to see Dr. Silverio Decamp 05/20/16@3 :30pm. Pt aware of appt.

## 2016-05-13 DIAGNOSIS — E119 Type 2 diabetes mellitus without complications: Secondary | ICD-10-CM | POA: Diagnosis not present

## 2016-05-13 DIAGNOSIS — E1165 Type 2 diabetes mellitus with hyperglycemia: Secondary | ICD-10-CM | POA: Diagnosis not present

## 2016-05-14 ENCOUNTER — Other Ambulatory Visit: Payer: BLUE CROSS/BLUE SHIELD

## 2016-05-14 DIAGNOSIS — R197 Diarrhea, unspecified: Secondary | ICD-10-CM

## 2016-05-16 LAB — CLOSTRIDIUM DIFFICILE BY PCR: Toxigenic C. Difficile by PCR: DETECTED — CR

## 2016-05-17 ENCOUNTER — Telehealth: Payer: Self-pay | Admitting: Gastroenterology

## 2016-05-17 ENCOUNTER — Other Ambulatory Visit: Payer: Self-pay

## 2016-05-17 MED ORDER — VANCOMYCIN HCL 125 MG PO CAPS: 125.0000 mg | ORAL_CAPSULE | Freq: Four times a day (QID) | ORAL | 0 refills | Status: AC

## 2016-05-18 NOTE — Telephone Encounter (Signed)
Advised good  Hygiene and hand washing. Her company has a Publishing copy and their own policies which are very similar.

## 2016-05-20 ENCOUNTER — Ambulatory Visit: Payer: BLUE CROSS/BLUE SHIELD | Admitting: Gastroenterology

## 2016-06-11 ENCOUNTER — Other Ambulatory Visit (INDEPENDENT_AMBULATORY_CARE_PROVIDER_SITE_OTHER): Payer: BLUE CROSS/BLUE SHIELD

## 2016-06-11 ENCOUNTER — Ambulatory Visit: Payer: BLUE CROSS/BLUE SHIELD | Admitting: Gastroenterology

## 2016-06-11 ENCOUNTER — Ambulatory Visit (INDEPENDENT_AMBULATORY_CARE_PROVIDER_SITE_OTHER): Payer: BLUE CROSS/BLUE SHIELD | Admitting: Gastroenterology

## 2016-06-11 ENCOUNTER — Encounter: Payer: Self-pay | Admitting: Gastroenterology

## 2016-06-11 ENCOUNTER — Telehealth: Payer: Self-pay

## 2016-06-11 VITALS — BP 126/78 | HR 96 | Ht 62.5 in | Wt 140.5 lb

## 2016-06-11 DIAGNOSIS — R159 Full incontinence of feces: Secondary | ICD-10-CM | POA: Diagnosis not present

## 2016-06-11 DIAGNOSIS — K50919 Crohn's disease, unspecified, with unspecified complications: Secondary | ICD-10-CM | POA: Diagnosis not present

## 2016-06-11 DIAGNOSIS — R140 Abdominal distension (gaseous): Secondary | ICD-10-CM

## 2016-06-11 DIAGNOSIS — Z23 Encounter for immunization: Secondary | ICD-10-CM

## 2016-06-11 LAB — CBC WITH DIFFERENTIAL/PLATELET
Basophils Absolute: 0 10*3/uL (ref 0.0–0.1)
Basophils Relative: 0.4 % (ref 0.0–3.0)
EOS ABS: 0.1 10*3/uL (ref 0.0–0.7)
Eosinophils Relative: 1.4 % (ref 0.0–5.0)
HCT: 39.5 % (ref 36.0–46.0)
Hemoglobin: 13.7 g/dL (ref 12.0–15.0)
LYMPHS ABS: 1.5 10*3/uL (ref 0.7–4.0)
Lymphocytes Relative: 21 % (ref 12.0–46.0)
MCHC: 34.6 g/dL (ref 30.0–36.0)
MCV: 91.5 fl (ref 78.0–100.0)
Monocytes Absolute: 0.5 10*3/uL (ref 0.1–1.0)
Monocytes Relative: 7.2 % (ref 3.0–12.0)
NEUTROS ABS: 5.1 10*3/uL (ref 1.4–7.7)
NEUTROS PCT: 70 % (ref 43.0–77.0)
Platelets: 308 10*3/uL (ref 150.0–400.0)
RBC: 4.32 Mil/uL (ref 3.87–5.11)
RDW: 13.1 % (ref 11.5–15.5)
WBC: 7.2 10*3/uL (ref 4.0–10.5)

## 2016-06-11 LAB — COMPREHENSIVE METABOLIC PANEL
ALT: 43 U/L — AB (ref 0–35)
AST: 32 U/L (ref 0–37)
Albumin: 4.2 g/dL (ref 3.5–5.2)
Alkaline Phosphatase: 115 U/L (ref 39–117)
BILIRUBIN TOTAL: 0.3 mg/dL (ref 0.2–1.2)
BUN: 15 mg/dL (ref 6–23)
CO2: 30 meq/L (ref 19–32)
CREATININE: 0.78 mg/dL (ref 0.40–1.20)
Calcium: 9.4 mg/dL (ref 8.4–10.5)
Chloride: 102 mEq/L (ref 96–112)
GFR: 80.63 mL/min (ref >60.00)
GLUCOSE: 257 mg/dL — AB (ref 70–99)
Potassium: 4.5 mEq/L (ref 3.5–5.1)
Sodium: 139 mEq/L (ref 135–145)
Total Protein: 6.3 g/dL (ref 6.0–8.3)

## 2016-06-11 LAB — HIGH SENSITIVITY CRP: CRP, High Sensitivity: 2.33 mg/L (ref 0.000–5.000)

## 2016-06-11 LAB — SEDIMENTATION RATE: Sed Rate: 6 mm/hr (ref 0–30)

## 2016-06-11 LAB — FERRITIN: Ferritin: 76.6 ng/mL (ref 10.0–291.0)

## 2016-06-11 NOTE — Telephone Encounter (Signed)
Humira Ambassador form faxed. Patient is aware of this.

## 2016-06-11 NOTE — Patient Instructions (Addendum)
We will give you samples of VSL #3 today, you can purchase this over the counter  You will be contacted by Dr Jillyn Hidden nurse about starting Humeria   Go to the basement for labs today  We will give you your first Hepatitis injection today   Use Phayzyme 1 capsule three times a day   We are also giving you a pneumococcal vaccine today

## 2016-06-11 NOTE — Progress Notes (Addendum)
[name]    [id number]    02-15-1961  Primary Care Physician:HAMRICK,MAURA L, MD  Referring Physician: Leonides Sake, MD [address] [place], [place] [address]  Chief complaint:  Crohn's disease  HPI: 56 year old female with history of Crohn's ileitis diagnosed in 1991 status post terminal ileum resection here for follow-up visit. She was last seen in September 2017. Colonoscopy in January 2017 while on 6-MP maintenance dose 50 mg daily did not show any evidence of active inflammation. She was taken off 6-MP in June 2017 for abnormal LFTs, AST and ALT have since then trended down to normal range. Recently she had an episode of C. difficile colitis and completed treatment 2 weeks ago. Her bowel movements have returned to her baseline 2-3 semi-formed to soft BM daily. Denies any nausea, vomiting, abdominal pain, melena or bright red blood per rectum. She continues to have significant bloating and intermittent right lower quadrant abdominal pain.    Outpatient Encounter Prescriptions as of 06/11/2016  Medication Sig  . aspirin 81 MG chewable tablet Chew 81 mg by mouth daily.    . cyclobenzaprine (FLEXERIL) 10 MG tablet Take 0.5-1 tablets (5-10 mg total) by mouth 3 (three) times daily as needed for muscle spasms.  . enalapril (VASOTEC) 5 MG tablet Take 1 tablet (5 mg total) by mouth daily.  Marland Kitchen EPIPEN 2-PAK 0.3 MG/0.3ML SOAJ injection Reported on 06/27/2015  . fenofibrate (TRICOR) 145 MG tablet Take 1 tablet (145 mg total) by mouth daily.  . fexofenadine (ALLEGRA) 180 MG tablet Take 1 tablet (180 mg total) by mouth daily.  Marland Kitchen levocetirizine (XYZAL) 5 MG tablet Take 5 mg by mouth every evening.  . metFORMIN (GLUCOPHAGE) 500 MG tablet Take 1 tablet (500 mg total) by mouth 2 (two) times daily with a meal.  . montelukast (SINGULAIR) 10 MG tablet Take 10 mg by mouth at bedtime.  Marland Kitchen NEXIUM 40 MG capsule TAKE 1 CAPSULE BY MOUTH ONCE DAILY  . rosuvastatin (CRESTOR) 10 MG tablet  Take 1 tablet (10 mg total) by mouth daily.  Marland Kitchen zolpidem (AMBIEN) 10 MG tablet Take 10 mg by mouth at bedtime as needed for sleep.    No facility-administered encounter medications on file as of 06/11/2016.     Allergies as of 06/11/2016 - Review Complete 06/11/2016  Allergen Reaction Noted  . Lipitor [atorvastatin] Other (See Comments) 03/28/2013    Past Medical History:  Diagnosis Date  . Allergic rhinitis due to pollen   . Anxiety state, unspecified   . Arthritis   . Bronchitis    hx  . Calculus of gallbladder without mention of cholecystitis or obstruction   . Crohn disease ([place])   . Depressive disorder, not elsewhere classified   . Diabetes mellitus without complication (HCC)    diet controlled  . Esophageal reflux   . Fibromyalgia   . Headache   . History of nuclear stress test    ETT-Myoview 10/17: EF 55%, normal perfusion; Low Risk  . Myalgia and myositis, unspecified   . Other and unspecified hyperlipidemia   . Palpitations   . Pneumonia 15   hx  . Unspecified essential hypertension   . Vitamin B deficiency     Past Surgical History:  Procedure Laterality Date  . CHOLECYSTECTOMY N/A 09/19/2014   Procedure: LAPAROSCOPIC CHOLECYSTECTOMY WITH INTRAOPERATIVE CHOLANGIOGRAM;  Surgeon: Donnie Mesa, MD;  Location: [facility];  Service: General;  Laterality: N/A;  . ESOPHAGOGASTRODUODENOSCOPY    .  EXPLORATORY LAPAROTOMY     for endometriosis  . TONSILLECTOMY AND ADENOIDECTOMY    . TUBAL LIGATION     BILATERAL    Family History  Problem Relation Age of Onset  . Lung cancer Maternal Grandmother   . Stroke Father   . Hypertension Mother   . Heart attack Mother   . Heart disease Mother   . Hypertension Brother   . Heart disease Brother   . Hypertension Sister   . Heart disease Sister   . Coronary artery disease Brother   . Colon cancer Neg Hx     Social History   Social History  . Marital status: Married    Spouse name: Dane  . Number of children: 2  .  Years of education: 12+   Occupational History  . 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 [employer]   Social History Main Topics  . Smoking status: Former Smoker    Packs/day: 1.00    Years: 35.00    Types: Cigarettes    Quit date: 05/31/2004  . Smokeless tobacco: Never Used  . Alcohol use No  . Drug use: No  . Sexual activity: No     Comment: married   Other Topics Concern  . Not on file   Social History Narrative   Lives with her husband and their pets. Her children are adults and live independently.      Review of systems: Review of Systems  Constitutional: Negative for fever and chills.  HENT: Positive for sinus problems   Eyes: Negative for blurred vision.  Respiratory: Negative for cough, shortness of breath and wheezing.   Cardiovascular: Negative for chest pain and palpitations.  Gastrointestinal: as per HPI Genitourinary: Negative for dysuria, urgency, frequency and hematuria.  Musculoskeletal: Positive for myalgias, back pain and joint pain.  Skin: Negative for itching and rash.  Neurological: Negative for dizziness, tremors, focal weakness, seizures and loss of consciousness.  positive for headaches Endo/Heme/Allergies: Positive for seasonal allergies.  Psychiatric/Behavioral: Negative for depression, suicidal ideas and hallucinations.  All other systems reviewed and are negative.   Physical Exam: Vitals:   06/11/16 0857  BP: 126/78  Pulse: 96   Body mass index is 25.29 kg/m. Gen:      No acute distress HEENT:  EOMI, sclera anicteric, papules in the posterior pharynx Neck:     No masses; no thyromegaly Lungs:    Clear to auscultation bilaterally; normal respiratory effort CV:         Regular rate and rhythm; no murmurs Abd:      + bowel sounds; soft, non-tender; no palpable masses, no distension Ext:    No edema; adequate peripheral perfusion Skin:      Warm and dry; no rash Neuro: alert and oriented x  3 Psych: normal mood and affect  Data Reviewed:  Reviewed labs, radiology imaging, old records and pertinent past GI work up   Assessment and Plan/Recommendations: 56 year old female with history of Crohn's ileitis diagnosed in 1991 status post terminal ileum resection with neoterminal ileum here for follow-up visit episode of C. difficile colitis  C. difficile colitis resolved Advised patient to take probiotics daily for 4-6 weeks, was given samples of VSL 3  Crohn's disease: Intermittent bloating and abdominal pain could be related to Crohn's disease To prevent a recurrent flare, we will have to consider starting biologic Discussed the risks and benefits of biologic therapy and patient agreed to proceed with Humira Follow-up CBC, CMP, CRP, ferritin,  B12, folate, TB QuantiFERON Patient hasn't received Pneumovax, hepatitis A and B vaccination, we will give it during this office visit  Intermittent fecal incontinence: Mostly when she urinates Advised patient to start Kegel exercises If continues to have persistent symptoms we'll consider referral to pelvic floor physical therapy  Return in 2-3 months or sooner if needed  25 minutes was spent face-to-face with the patient. Greater than 50% of the time used for counseling as well as treatment plan and follow-up. She had multiple questions which were answered to her satisfaction  K. Denzil Magnuson , MD [phone] Mon-Fri 8a-5p [phone] after 5p, weekends, holidays  CC: Hamrick, Maura L, MD   Addendum: Patient c/o nodules in the back of throat that she thinks has been getting bigger, noted papules in the posterior pharynx. Will refer to ENT for evaluation and possible biopsy if needed  K. Denzil Magnuson , MD

## 2016-06-13 LAB — QUANTIFERON TB GOLD ASSAY (BLOOD)
Interferon Gamma Release Assay: NEGATIVE
Mitogen-Nil: >10 IU/mL
QUANTIFERON TB AG MINUS NIL: 0 [IU]/mL
Quantiferon Nil Value: 0.03 IU/mL

## 2016-06-15 ENCOUNTER — Telehealth: Payer: Self-pay | Admitting: *Deleted

## 2016-06-15 DIAGNOSIS — R221 Localized swelling, mass and lump, neck: Secondary | ICD-10-CM

## 2016-06-15 NOTE — Telephone Encounter (Signed)
Patient needs an appointment with ENT for nodules in the back of the throat

## 2016-06-18 NOTE — Telephone Encounter (Signed)
===  View-only below this line===  ----- Message ----- From: Mauri Pole, MD Sent: 06/15/2016   1:19 PM To: Oda Kilts, CMA Subject: RE: referral to ENT                            I will addend my note, I forgot to mention it.  ----- Message ----- From: Oda Kilts, CMA Sent: 06/15/2016  11:32 AM To: Mauri Pole, MD Subject: referral to ENT                                I was starting to do this patients referral to ENT. The diagnosis of that is not in your office note or mention of it.. Or at least I coudnt find it.. What was the dx code you wanted me to use  I cant pull up nodules in the throat as a dx   Thanks

## 2016-06-22 ENCOUNTER — Telehealth: Payer: Self-pay | Admitting: Gastroenterology

## 2016-06-22 ENCOUNTER — Other Ambulatory Visit: Payer: Self-pay

## 2016-06-22 DIAGNOSIS — Z8619 Personal history of other infectious and parasitic diseases: Secondary | ICD-10-CM

## 2016-06-22 DIAGNOSIS — R197 Diarrhea, unspecified: Secondary | ICD-10-CM

## 2016-06-22 NOTE — Telephone Encounter (Signed)
Patient called states that her diarrhea had resolved for 4-5 days and now is she is experiencing this again. Denies fever/nausea/vomiting. Please advise.

## 2016-06-22 NOTE — Telephone Encounter (Signed)
Please advise patient to do C.diff, will need to exclude recurrent C.diff infection. Plan to start patient on Humira once approved by insurance if negative for C.diff

## 2016-06-22 NOTE — Telephone Encounter (Signed)
Patient will come to the lab tomorrow to pick up specimen container. Order placed for C diff.

## 2016-06-23 ENCOUNTER — Other Ambulatory Visit: Payer: Self-pay | Admitting: Gastroenterology

## 2016-06-23 ENCOUNTER — Other Ambulatory Visit: Payer: BLUE CROSS/BLUE SHIELD

## 2016-06-23 DIAGNOSIS — Z8619 Personal history of other infectious and parasitic diseases: Secondary | ICD-10-CM

## 2016-06-23 DIAGNOSIS — R197 Diarrhea, unspecified: Secondary | ICD-10-CM

## 2016-06-24 ENCOUNTER — Telehealth: Payer: Self-pay | Admitting: Gastroenterology

## 2016-06-24 LAB — C. DIFFICILE GDH AND TOXIN A/B
C. DIFF TOXIN A/B: NOT DETECTED
C. DIFFICILE GDH: NOT DETECTED

## 2016-06-24 NOTE — Telephone Encounter (Signed)
Spoke with the patient. She has received notice from her insurance the Humira is covered. Not heard from the [facility] nurse yet. I have given her that phone number so she can call to them. Rx faxed to [facility]. Her C-diff specimen has been submitted.

## 2016-06-24 NOTE — Telephone Encounter (Signed)
Called ENT to see if patient has been scheduled, They stated they have been behind on scheduling

## 2016-06-24 NOTE — Telephone Encounter (Signed)
Patient scheduled with Dr Redmond Baseman on 07/01/2016 at 10:40am   Called patient to inform appointment date and time

## 2016-06-25 ENCOUNTER — Telehealth: Payer: Self-pay | Admitting: Gastroenterology

## 2016-06-25 NOTE — Telephone Encounter (Signed)
Neta needs chart notes on this patient for humira. F: [phone]

## 2016-06-25 NOTE — Telephone Encounter (Signed)
Faxed last 2 office visits, procedure note and labs.

## 2016-07-01 ENCOUNTER — Telehealth: Payer: Self-pay | Admitting: Gastroenterology

## 2016-07-01 DIAGNOSIS — J3089 Other allergic rhinitis: Secondary | ICD-10-CM | POA: Diagnosis not present

## 2016-07-01 DIAGNOSIS — R0982 Postnasal drip: Secondary | ICD-10-CM | POA: Diagnosis not present

## 2016-07-01 NOTE — Telephone Encounter (Signed)
Left message for patient that Humira has been approved and they are sending it to her pharmacy. She should be hearing from the [facility] to get started on this.  If she has any further questions to please call our office.

## 2016-07-07 ENCOUNTER — Telehealth: Payer: Self-pay | Admitting: Pharmacist

## 2016-07-07 NOTE — Telephone Encounter (Signed)
Called pt for f/u lipid panel results from PCP. States she has not had labs drawn. Is tolerating Crestor 39m and fenofibrate 1432mdaily. She will go to her PCP this week for fasting lipid panel and have results faxed to clinic.

## 2016-07-13 ENCOUNTER — Telehealth: Payer: Self-pay

## 2016-07-13 MED ORDER — ROSUVASTATIN CALCIUM 20 MG PO TABS: 20.0000 mg | ORAL_TABLET | Freq: Every day | ORAL | 3 refills | Status: DC

## 2016-07-13 NOTE — Telephone Encounter (Signed)
-----   Message from Liliane Shi, [place] sent at 07/13/2016 12:36 PM EST ----- Please call patient. Increase Crestor to 20 mg QD Check Lipids and LFTs in 6 weeks.   Send patient low fat diet to follow to help with Triglycerides.  Richardson Dopp, PA-C   07/13/2016 12:36 PM

## 2016-07-13 NOTE — Telephone Encounter (Signed)
Patient notified of lab results. Patient's crestor increased to 20 mg daily and sent to preferred pharmacy. Patient advised to follow a low fat diet to help decrease triglycerides. Low-fat diet explained to patient in detail. Patient will have repeat Lipids and LFTs in 6 weeks. Patient states that she will have these labs drawn at home like she did before and have them scanned in. Patient verbalized understanding and is in agreement with this plan.

## 2016-07-19 ENCOUNTER — Ambulatory Visit (INDEPENDENT_AMBULATORY_CARE_PROVIDER_SITE_OTHER): Payer: BLUE CROSS/BLUE SHIELD | Admitting: Gastroenterology

## 2016-07-19 DIAGNOSIS — K5090 Crohn's disease, unspecified, without complications: Secondary | ICD-10-CM

## 2016-07-19 DIAGNOSIS — Z23 Encounter for immunization: Secondary | ICD-10-CM

## 2016-08-05 ENCOUNTER — Telehealth: Payer: Self-pay | Admitting: Gastroenterology

## 2016-08-05 ENCOUNTER — Ambulatory Visit: Payer: BLUE CROSS/BLUE SHIELD | Admitting: Gastroenterology

## 2016-08-05 NOTE — Telephone Encounter (Signed)
No Charge

## 2016-08-18 ENCOUNTER — Telehealth: Payer: Self-pay | Admitting: Gastroenterology

## 2016-08-18 NOTE — Telephone Encounter (Signed)
Spoke with the patient. She is very congested. Afebrile. No body aches. Throat is sore. Some cough. Post nasal drip. It all started in the last 24 hours. She has not seen PCP.

## 2016-08-18 NOTE — Telephone Encounter (Signed)
Its ok to take the Humira injection as she is afebrile. Please advise patient to contact PMD if her sinus symptoms get worse. Could be seasonal allergy.

## 2016-08-18 NOTE — Telephone Encounter (Signed)
Patient is instructed. 

## 2016-08-19 ENCOUNTER — Telehealth: Payer: Self-pay | Admitting: Gastroenterology

## 2016-08-19 NOTE — Telephone Encounter (Signed)
She is going to her PCP tomorrow. Using her nebulizer today. Dry cough. Afebrile. She is holding the Humira today. She will call us tomorrow with an update.

## 2016-08-20 NOTE — Telephone Encounter (Signed)
Patient calls. She saw her PCP. Tested positive for flu and has been started on Tamiflu. She will hold her Humira until she completes the Tamiflu unless there are any complications in which she will call us for instructions.

## 2016-08-20 NOTE — Telephone Encounter (Signed)
ok 

## 2016-09-01 ENCOUNTER — Telehealth: Payer: Self-pay | Admitting: Gastroenterology

## 2016-09-01 NOTE — Telephone Encounter (Signed)
Pt is due for standard twinrix #3 after July 12.  Pt has been notified

## 2016-09-22 DIAGNOSIS — L301 Dyshidrosis [pompholyx]: Secondary | ICD-10-CM | POA: Diagnosis not present

## 2016-09-23 ENCOUNTER — Telehealth: Payer: Self-pay | Admitting: Gastroenterology

## 2016-09-23 NOTE — Telephone Encounter (Signed)
No interaction but why is she on prednisone? Her mailbox is full and messages cannot be left.

## 2016-09-24 NOTE — Telephone Encounter (Signed)
Left her this information on her voicemail per her request.

## 2016-09-24 NOTE — Telephone Encounter (Signed)
[name] went to dermatology for an issue with the skin on her hands. She is treating it with a steroid cream but has a prednisone taper she can use if she wants to. She has "hart knots" under the skin. She does not have open sores. Is it okay to use her Humira?

## 2016-09-24 NOTE — Telephone Encounter (Signed)
Okay to continue Humira even if she plans to take the low-dose prednisone. Thank you

## 2016-11-04 ENCOUNTER — Other Ambulatory Visit: Payer: Self-pay | Admitting: Obstetrics and Gynecology

## 2016-11-04 DIAGNOSIS — Z124 Encounter for screening for malignant neoplasm of cervix: Secondary | ICD-10-CM | POA: Diagnosis not present

## 2016-11-04 DIAGNOSIS — Z01419 Encounter for gynecological examination (general) (routine) without abnormal findings: Secondary | ICD-10-CM | POA: Diagnosis not present

## 2016-11-04 DIAGNOSIS — Z1231 Encounter for screening mammogram for malignant neoplasm of breast: Secondary | ICD-10-CM | POA: Diagnosis not present

## 2016-11-04 DIAGNOSIS — Z6824 Body mass index (BMI) 24.0-24.9, adult: Secondary | ICD-10-CM | POA: Diagnosis not present

## 2016-11-09 LAB — CYTOLOGY - PAP

## 2016-11-15 ENCOUNTER — Telehealth: Payer: Self-pay | Admitting: Gastroenterology

## 2016-11-15 ENCOUNTER — Other Ambulatory Visit: Payer: BLUE CROSS/BLUE SHIELD

## 2016-11-15 ENCOUNTER — Other Ambulatory Visit (INDEPENDENT_AMBULATORY_CARE_PROVIDER_SITE_OTHER): Payer: BLUE CROSS/BLUE SHIELD

## 2016-11-15 ENCOUNTER — Other Ambulatory Visit: Payer: Self-pay

## 2016-11-15 DIAGNOSIS — A0472 Enterocolitis due to Clostridium difficile, not specified as recurrent: Secondary | ICD-10-CM

## 2016-11-15 LAB — BASIC METABOLIC PANEL
BUN: 18 mg/dL (ref 6–23)
CHLORIDE: 103 meq/L (ref 96–112)
CO2: 26 meq/L (ref 19–32)
Calcium: 9.4 mg/dL (ref 8.4–10.5)
Creatinine, Ser: 0.77 mg/dL (ref 0.40–1.20)
GFR: 81.71 mL/min (ref >60.00)
GLUCOSE: 238 mg/dL — AB (ref 70–99)
POTASSIUM: 4.1 meq/L (ref 3.5–5.1)
SODIUM: 137 meq/L (ref 135–145)

## 2016-11-15 LAB — CBC WITH DIFFERENTIAL/PLATELET
BASOS PCT: 0.7 % (ref 0.0–3.0)
Basophils Absolute: 0.1 10*3/uL (ref 0.0–0.1)
EOS ABS: 0.1 10*3/uL (ref 0.0–0.7)
Eosinophils Relative: 1.7 % (ref 0.0–5.0)
HEMATOCRIT: 44.7 % (ref 36.0–46.0)
HEMOGLOBIN: 14.8 g/dL (ref 12.0–15.0)
LYMPHS PCT: 31.2 % (ref 12.0–46.0)
Lymphs Abs: 2.5 10*3/uL (ref 0.7–4.0)
MCHC: 33.2 g/dL (ref 30.0–36.0)
MCV: 92.6 fl (ref 78.0–100.0)
Monocytes Absolute: 0.6 10*3/uL (ref 0.1–1.0)
Monocytes Relative: 7 % (ref 3.0–12.0)
Neutro Abs: 4.7 10*3/uL (ref 1.4–7.7)
Neutrophils Relative %: 59.4 % (ref 43.0–77.0)
Platelets: 311 10*3/uL (ref 150.0–400.0)
RBC: 4.83 Mil/uL (ref 3.87–5.11)
RDW: 12.9 % (ref 11.5–15.5)
WBC: 7.9 10*3/uL (ref 4.0–10.5)

## 2016-11-15 MED ORDER — VANCOMYCIN HCL 125 MG PO CAPS: 125.0000 mg | ORAL_CAPSULE | Freq: Four times a day (QID) | ORAL | 0 refills | Status: DC

## 2016-11-15 NOTE — Telephone Encounter (Signed)
Multiple urgent watery stools daily. Lower abdominal cramping sensation. No appetite. Feels similar to her symptoms when she had a c-diff infection. Do you want labs and stool study? No openings on the schedules this week.

## 2016-11-15 NOTE — Telephone Encounter (Signed)
Left instructions on her voicemail and requested a call back

## 2016-11-15 NOTE — Telephone Encounter (Signed)
Please check CBC, BMP and GI pathogen panel. Advise patient to stay hydrated. Will also send Rx for PO Vancomycin 152m q6h, please instruct patient to start taking it once she drops stool specimen. Thanks

## 2016-11-16 ENCOUNTER — Telehealth: Payer: Self-pay | Admitting: Gastroenterology

## 2016-11-16 NOTE — Telephone Encounter (Signed)
Patient did come in for her labs.

## 2016-11-16 NOTE — Telephone Encounter (Signed)
[name] calls today to check on her lab results. See the result note. She still does not feel well today. She has picked up her Vanco and has started taking it. Blood work is back. Understands her glucose was elevated. She will discuss with her PCP. Maintain hydration.

## 2016-11-18 ENCOUNTER — Telehealth: Payer: Self-pay | Admitting: Gastroenterology

## 2016-11-18 ENCOUNTER — Other Ambulatory Visit: Payer: Self-pay

## 2016-11-18 LAB — GASTROINTESTINAL PATHOGEN PANEL PCR
C. difficile Tox A/B, PCR: NOT DETECTED
CRYPTOSPORIDIUM, PCR: NOT DETECTED
Campylobacter, PCR: NOT DETECTED
E COLI (ETEC) LT/ST, PCR: NOT DETECTED
E COLI 0157, PCR: NOT DETECTED
E coli (STEC) stx1/stx2, PCR: NOT DETECTED
GIARDIA LAMBLIA, PCR: NOT DETECTED
Norovirus, PCR: NOT DETECTED
Rotavirus A, PCR: NOT DETECTED
Salmonella, PCR: NOT DETECTED
Shigella, PCR: NOT DETECTED

## 2016-11-18 NOTE — Telephone Encounter (Signed)
Note for work provided. Results of normal stool study discussed. She chooses to stop Vancomycin. She is not feeling better except she does not have diarrhea. She does complain of upper abdominal discomfort. She will continue to encourage fluids. Afebrile.

## 2016-11-19 NOTE — Telephone Encounter (Signed)
Spoke with the patient. She thinks she may be a little better today. She does feel sometimes like her tongue is trying to swell. She does carry an Epi-pen. She is free of any GI symptoms right now.

## 2016-11-19 NOTE — Telephone Encounter (Signed)
Please overbook next week on Wednesday. Thanks

## 2016-11-19 NOTE — Telephone Encounter (Signed)
Where do you want to work her in? APP appointments are held.

## 2016-11-19 NOTE — Telephone Encounter (Signed)
Please schedule office visit.

## 2016-11-19 NOTE — Telephone Encounter (Signed)
Ok thanks 

## 2016-12-10 ENCOUNTER — Ambulatory Visit (INDEPENDENT_AMBULATORY_CARE_PROVIDER_SITE_OTHER): Payer: BLUE CROSS/BLUE SHIELD | Admitting: Gastroenterology

## 2016-12-10 DIAGNOSIS — K5000 Crohn's disease of small intestine without complications: Secondary | ICD-10-CM

## 2016-12-10 DIAGNOSIS — Z23 Encounter for immunization: Secondary | ICD-10-CM | POA: Diagnosis not present

## 2016-12-13 ENCOUNTER — Telehealth: Payer: Self-pay | Admitting: *Deleted

## 2016-12-13 NOTE — Telephone Encounter (Signed)
-----   Message from Liliane Shi, [place] sent at 12/13/2016  1:40 PM EDT ----- Please call the patient Labs from her employer reviewed:  Glucose 162, Creatinine 0.62, K 4.7, Calcium 10.3, ALT 29, Trigs 318, LDL 45, A1c 8.5 LFTs ok.  LDL at goal.  Triglycerides are elevated but better since prior testing.  Kidney function is normal. Sugar is high and the A1c is consistent with diabetes.  She needs to follow up with her PCP for elevated sugars.  Make sure a copy of her labs goes to her PCP. Richardson Dopp, PA-C    12/13/2016 1:39 PM

## 2016-12-13 NOTE — Telephone Encounter (Signed)
Lmtcb to go over lab results and recommendations.

## 2016-12-17 ENCOUNTER — Telehealth: Payer: Self-pay | Admitting: Physician Assistant

## 2016-12-17 NOTE — Telephone Encounter (Signed)
Pt has been notified of lab results and findings by phone with verbal understanding. Pt advised to f/u PCP about glucose and A1c level. Pt thanked me for my call.

## 2016-12-17 NOTE — Telephone Encounter (Signed)
-----   Message from Liliane Shi, [place] sent at 12/13/2016  1:40 PM EDT ----- Please call the patient Labs from her employer reviewed:  Glucose 162, Creatinine 0.62, K 4.7, Calcium 10.3, ALT 29, Trigs 318, LDL 45, A1c 8.5 LFTs ok.  LDL at goal.  Triglycerides are elevated but better since prior testing.  Kidney function is normal. Sugar is high and the A1c is consistent with diabetes.  She needs to follow up with her PCP for elevated sugars.  Make sure a copy of her labs goes to her PCP. Richardson Dopp, PA-C    12/13/2016 1:39 PM

## 2016-12-17 NOTE — Telephone Encounter (Signed)
[name] is calling ot find out if you have received her lab results from [facility] . Please call

## 2016-12-18 ENCOUNTER — Encounter (HOSPITAL_COMMUNITY): Payer: Self-pay | Admitting: Emergency Medicine

## 2016-12-18 ENCOUNTER — Emergency Department (HOSPITAL_COMMUNITY)
Admission: EM | Admit: 2016-12-18 | Discharge: 2016-12-19 | Disposition: A | Discharge status: Home / Self Care | Payer: BLUE CROSS/BLUE SHIELD | Attending: Emergency Medicine | Admitting: Emergency Medicine

## 2016-12-18 DIAGNOSIS — H81399 Other peripheral vertigo, unspecified ear: Secondary | ICD-10-CM | POA: Diagnosis not present

## 2016-12-18 DIAGNOSIS — Z79899 Other long term (current) drug therapy: Secondary | ICD-10-CM | POA: Insufficient documentation

## 2016-12-18 DIAGNOSIS — Z87891 Personal history of nicotine dependence: Secondary | ICD-10-CM | POA: Diagnosis not present

## 2016-12-18 DIAGNOSIS — Z794 Long term (current) use of insulin: Secondary | ICD-10-CM | POA: Insufficient documentation

## 2016-12-18 DIAGNOSIS — I2510 Atherosclerotic heart disease of native coronary artery without angina pectoris: Secondary | ICD-10-CM | POA: Insufficient documentation

## 2016-12-18 DIAGNOSIS — I10 Essential (primary) hypertension: Secondary | ICD-10-CM | POA: Insufficient documentation

## 2016-12-18 DIAGNOSIS — R42 Dizziness and giddiness: Secondary | ICD-10-CM | POA: Diagnosis present

## 2016-12-18 DIAGNOSIS — E119 Type 2 diabetes mellitus without complications: Secondary | ICD-10-CM | POA: Insufficient documentation

## 2016-12-18 DIAGNOSIS — H81391 Other peripheral vertigo, right ear: Secondary | ICD-10-CM | POA: Diagnosis not present

## 2016-12-18 DIAGNOSIS — Z7982 Long term (current) use of aspirin: Secondary | ICD-10-CM | POA: Insufficient documentation

## 2016-12-18 MED ORDER — LORAZEPAM 1 MG PO TABS
1.0000 mg | ORAL_TABLET | Freq: Once | ORAL | Status: AC
  Administered 2016-12-18: 1 mg via ORAL
  Filled 2016-12-18: qty 1

## 2016-12-18 NOTE — ED Triage Notes (Signed)
Pt reports that she has been having a headache for the last several days then today began feeling dizzy when turning head or standing. Pt states it feels like there is pressure in right ear. Pt reports being on prednisone to help treating the pressure in ear.

## 2016-12-18 NOTE — ED Provider Notes (Signed)
[facility] DEPT Provider Note   CSN: [id number] Arrival date & time: 12/18/16  2158     History   Chief Complaint Chief Complaint  Patient presents with  . Dizziness    HPI [name] is a 56 y.o. female.  She presents for evaluation of buzzing in right ear, a sense of "congestion,", and dizziness, which have not improved since her PCP put her on prednisone, 3 days ago.  She denies fever, chills, nausea, vomiting, weakness or dizziness.  Today her symptoms worsened and it became hard to walk.  At that time she felt like she is off balance.  The symptoms improved with rest.  HPI  Past Medical History:  Diagnosis Date  . Allergic rhinitis due to pollen   . Anxiety state, unspecified   . Arthritis   . Bronchitis    hx  . Calculus of gallbladder without mention of cholecystitis or obstruction   . Crohn disease ([place])   . Depressive disorder, not elsewhere classified   . Diabetes mellitus without complication (HCC)    diet controlled  . Esophageal reflux   . Fibromyalgia   . Headache   . History of nuclear stress test    ETT-Myoview 10/17: EF 55%, normal perfusion; Low Risk  . Myalgia and myositis, unspecified   . Other and unspecified hyperlipidemia   . Palpitations   . Pneumonia 15   hx  . Unspecified essential hypertension   . Vitamin B deficiency     Patient Active Problem List   Diagnosis Date Noted  . Fatty infiltration of liver 12/23/2015  . CAD (coronary artery disease) 10/10/2011  . Diabetes mellitus type 2, uncomplicated ([place]) 85/27/7824  . ESSENTIAL HYPERTENSION, BENIGN 08/18/2009  . CHEST PAIN UNSPECIFIED 08/01/2009  . B12 DEFICIENCY 01/06/2009  . Hyperlipidemia 03/16/2007  . ANXIETY 03/16/2007  . DEPRESSION 03/16/2007  . Seasonal and perennial allergic rhinitis 03/16/2007  . GERD 03/16/2007  . CHOLELITHIASIS W/O CHOLECYSTITIS W/O OBST 03/16/2007  . SACROILIITIS NEC 03/16/2007  . FIBROMYALGIA 03/16/2007  . PALPITATIONS 03/16/2007  . Crohn's  disease ([place]) 03/16/2007    Past Surgical History:  Procedure Laterality Date  . CHOLECYSTECTOMY N/A 09/19/2014   Procedure: LAPAROSCOPIC CHOLECYSTECTOMY WITH INTRAOPERATIVE CHOLANGIOGRAM;  Surgeon: Donnie Mesa, MD;  Location: [facility];  Service: General;  Laterality: N/A;  . ESOPHAGOGASTRODUODENOSCOPY    . EXPLORATORY LAPAROTOMY     for endometriosis  . TONSILLECTOMY AND ADENOIDECTOMY    . TUBAL LIGATION     BILATERAL    OB History    No data available       Home Medications    Prior to Admission medications   Medication Sig Start Date End Date Taking? Authorizing Provider  aspirin 81 MG chewable tablet Chew 81 mg by mouth daily.      [provider]  cyclobenzaprine (FLEXERIL) 10 MG tablet Take 0.5-1 tablets (5-10 mg total) by mouth 3 (three) times daily as needed for muscle spasms. 01/11/13   Harrison Mons, PA-C  enalapril (VASOTEC) 5 MG tablet Take 1 tablet (5 mg total) by mouth daily. 06/24/14   Larey Dresser, MD  EPIPEN 2-PAK 0.3 MG/0.3ML SOAJ injection Reported on 06/27/2015 10/09/14   [provider]  fenofibrate (TRICOR) 145 MG tablet Take 1 tablet (145 mg total) by mouth daily. 04/08/16   Larey Dresser, MD  fexofenadine (ALLEGRA) 180 MG tablet Take 1 tablet (180 mg total) by mouth daily. 12/03/14   Harrison Mons, PA-C  levocetirizine (XYZAL) 5 MG tablet Take  5 mg by mouth every evening.    [provider]  LORazepam (ATIVAN) 0.5 MG tablet Take 2 tablets (1 mg total) by mouth every 6 (six) hours as needed (Dizziness). 12/19/16   Daleen Bo, MD  metFORMIN (GLUCOPHAGE) 500 MG tablet Take 1 tablet (500 mg total) by mouth 2 (two) times daily with a meal. 12/03/14   Jeffery, Chelle, PA-C  montelukast (SINGULAIR) 10 MG tablet Take 10 mg by mouth at bedtime.    [provider]  NEXIUM 40 MG capsule TAKE 1 CAPSULE BY MOUTH ONCE DAILY 05/05/15   Mauri Pole, MD  rosuvastatin (CRESTOR) 20 MG tablet Take 1 tablet (20 mg total) by mouth  daily. 07/13/16 10/11/16  Richardson Dopp T, PA-C  vancomycin (VANCOCIN HCL) 125 MG capsule Take 1 capsule (125 mg total) by mouth every 6 (six) hours. 11/15/16   Mauri Pole, MD  zolpidem (AMBIEN) 10 MG tablet Take 10 mg by mouth at bedtime as needed for sleep.     [provider]    Family History Family History  Problem Relation Age of Onset  . Lung cancer Maternal Grandmother   . Stroke Father   . Hypertension Mother   . Heart attack Mother   . Heart disease Mother   . Hypertension Brother   . Heart disease Brother   . Hypertension Sister   . Heart disease Sister   . Coronary artery disease Brother   . Colon cancer Neg Hx     Social History Social History  Substance Use Topics  . Smoking status: Former Smoker    Packs/day: 1.00    Years: 35.00    Types: Cigarettes    Quit date: 05/31/2004  . Smokeless tobacco: Never Used  . Alcohol use No     Allergies   Lipitor [atorvastatin]   Review of Systems Review of Systems  All other systems reviewed and are negative.    Physical Exam Updated Vital Signs BP (!) 150/73 (BP Location: Left Arm)   Pulse 75   Temp 98.2 F (36.8 C) (Oral)   Resp 16   Ht 5' 3"  (1.6 m)   Wt 61.2 kg (135 lb)   LMP  (LMP Unknown)   SpO2 100%   BMI 23.91 kg/m   Physical Exam  Constitutional: She is oriented to person, place, and time. She appears well-developed and well-nourished. No distress.  HENT:  Head: Normocephalic and atraumatic.  Eyes: Pupils are equal, round, and reactive to light. Conjunctivae and EOM are normal.  Neck: Normal range of motion and phonation normal. Neck supple.  Cardiovascular: Normal rate and regular rhythm.   Pulmonary/Chest: Effort normal and breath sounds normal. She exhibits no tenderness.  Abdominal: Soft. She exhibits no distension. There is no tenderness. There is no guarding.  Musculoskeletal: Normal range of motion.  Neurological: She is alert and oriented to person, place, and time.  She exhibits normal muscle tone.  No dysarthria, aphasia or nystagmus.  Skin: Skin is warm and dry.  Psychiatric: She has a normal mood and affect. Her behavior is normal. Judgment and thought content normal.  Nursing note and vitals reviewed.    ED Treatments / Results  Labs (all labs ordered are listed, but only abnormal results are displayed) Labs Reviewed - No data to display  EKG  EKG Interpretation None       Radiology No results found.  Procedures Procedures (including critical care time)  Medications Ordered in ED Medications  LORazepam (ATIVAN) tablet 1  mg (1 mg Oral Given 12/18/16 2306)     Initial Impression / Assessment and Plan / ED Course  I have reviewed the triage vital signs and the nursing notes.  Pertinent labs & imaging results that were available during my care of the patient were reviewed by me and considered in my medical decision making (see chart for details).      Patient Vitals for the past 24 hrs:  BP Temp Temp src Pulse Resp SpO2 Height Weight  12/18/16 2205 - - - - - - 5' 3"  (1.6 m) 61.2 kg (135 lb)  12/18/16 2204 (!) 150/73 98.2 F (36.8 C) Oral 75 16 100 % - -    12:10 AM Reevaluation with update and discussion. After initial assessment and treatment, an updated evaluation reveals she reports that her dizziness has improved.  Findings discussed with the patient and all questions were answered. Haniyyah Sakuma L    Final Clinical Impressions(s) / ED Diagnoses   Final diagnoses:  Peripheral vertigo, unspecified laterality   Nonspecific dizziness, improved with benzodiazepine treatment.  Doubt ACS, PE or pneumonia.  Doubt CVA.  Nursing Notes Reviewed/ Care Coordinated Applicable Imaging Reviewed Interpretation of Laboratory Data incorporated into ED treatment  The patient appears reasonably screened and/or stabilized for discharge and I doubt any other medical condition or other [facility] requiring further screening, evaluation, or  treatment in the ED at this time prior to discharge.  Plan: Home Medications-continue usual medications; Home Treatments-rest, fluids; return here if the recommended treatment, does not improve the symptoms; Recommended follow up-PCP, as needed   New Prescriptions New Prescriptions   LORAZEPAM (ATIVAN) 0.5 MG TABLET    Take 2 tablets (1 mg total) by mouth every 6 (six) hours as needed (Dizziness).     Daleen Bo, MD 12/19/16 [phone]

## 2016-12-19 MED ORDER — LORAZEPAM 0.5 MG PO TABS: 1.0000 mg | ORAL_TABLET | Freq: Four times a day (QID) | ORAL | 0 refills | Status: DC | PRN

## 2016-12-19 NOTE — Discharge Instructions (Signed)
Get plenty of rest and drink a lot of fluids.  Take the medicine as prescribed for dizziness, but do not drive or work when you are taking the medicine.

## 2016-12-29 ENCOUNTER — Encounter: Payer: Self-pay | Admitting: Gastroenterology

## 2017-02-17 ENCOUNTER — Telehealth: Payer: Self-pay | Admitting: Gastroenterology

## 2017-02-17 NOTE — Telephone Encounter (Signed)
Patient is on Humira. Not on her medication list. Left a message to call back.

## 2017-02-17 NOTE — Telephone Encounter (Signed)
ok 

## 2017-02-17 NOTE — Telephone Encounter (Signed)
Scheduled for follow up appointment due to her Crohn's and use of Humira. Up to date on her TB screening.  She feels she is doing well. Does have complaint of anal itching.  She will be here to see you on 02/22/17

## 2017-02-22 ENCOUNTER — Encounter (INDEPENDENT_AMBULATORY_CARE_PROVIDER_SITE_OTHER): Payer: Self-pay

## 2017-02-22 ENCOUNTER — Other Ambulatory Visit (INDEPENDENT_AMBULATORY_CARE_PROVIDER_SITE_OTHER): Payer: BLUE CROSS/BLUE SHIELD

## 2017-02-22 ENCOUNTER — Ambulatory Visit (INDEPENDENT_AMBULATORY_CARE_PROVIDER_SITE_OTHER): Payer: BLUE CROSS/BLUE SHIELD | Admitting: Gastroenterology

## 2017-02-22 ENCOUNTER — Encounter: Payer: Self-pay | Admitting: Gastroenterology

## 2017-02-22 VITALS — BP 116/68 | HR 72 | Ht 63.0 in | Wt 138.0 lb

## 2017-02-22 DIAGNOSIS — K5000 Crohn's disease of small intestine without complications: Secondary | ICD-10-CM

## 2017-02-22 DIAGNOSIS — R197 Diarrhea, unspecified: Secondary | ICD-10-CM

## 2017-02-22 LAB — COMPREHENSIVE METABOLIC PANEL
ALBUMIN: 4.4 g/dL (ref 3.5–5.2)
ALT: 62 U/L — AB (ref 0–35)
AST: 53 U/L — AB (ref 0–37)
Alkaline Phosphatase: 76 U/L (ref 39–117)
BUN: 17 mg/dL (ref 6–23)
CALCIUM: 10 mg/dL (ref 8.4–10.5)
CO2: 30 mEq/L (ref 19–32)
CREATININE: 0.78 mg/dL (ref 0.40–1.20)
Chloride: 100 mEq/L (ref 96–112)
GFR: 80.43 mL/min (ref >60.00)
Glucose, Bld: 134 mg/dL — ABNORMAL HIGH (ref 70–99)
Potassium: 4.1 mEq/L (ref 3.5–5.1)
SODIUM: 139 meq/L (ref 135–145)
TOTAL PROTEIN: 6.7 g/dL (ref 6.0–8.3)
Total Bilirubin: 0.5 mg/dL (ref 0.2–1.2)

## 2017-02-22 LAB — CBC WITH DIFFERENTIAL/PLATELET
Basophils Absolute: 0.1 10*3/uL (ref 0.0–0.1)
Basophils Relative: 0.9 % (ref 0.0–3.0)
Eosinophils Absolute: 0.2 10*3/uL (ref 0.0–0.7)
Eosinophils Relative: 1.9 % (ref 0.0–5.0)
HCT: 42 % (ref 36.0–46.0)
Hemoglobin: 14.2 g/dL (ref 12.0–15.0)
Lymphocytes Relative: 26.1 % (ref 12.0–46.0)
Lymphs Abs: 2.3 10*3/uL (ref 0.7–4.0)
MCHC: 33.8 g/dL (ref 30.0–36.0)
MCV: 92.8 fl (ref 78.0–100.0)
Monocytes Absolute: 0.7 10*3/uL (ref 0.1–1.0)
Monocytes Relative: 8.2 % (ref 3.0–12.0)
Neutro Abs: 5.6 10*3/uL (ref 1.4–7.7)
Neutrophils Relative %: 62.9 % (ref 43.0–77.0)
Platelets: 288 10*3/uL (ref 150.0–400.0)
RBC: 4.52 Mil/uL (ref 3.87–5.11)
RDW: 13.4 % (ref 11.5–15.5)
WBC: 8.8 10*3/uL (ref 4.0–10.5)

## 2017-02-22 LAB — HIGH SENSITIVITY CRP: CRP HIGH SENSITIVITY: 2.97 mg/L (ref 0.000–5.000)

## 2017-02-22 LAB — TSH: TSH: 2.86 u[IU]/mL (ref 0.35–4.50)

## 2017-02-22 MED ORDER — DIPHENOXYLATE-ATROPINE 2.5-0.025 MG PO TABS: 1.0000 | ORAL_TABLET | Freq: Three times a day (TID) | ORAL | 1 refills | Status: DC

## 2017-02-22 NOTE — Progress Notes (Signed)
[name]    [id number]    29-Dec-1960  Primary Care Physician:Hamrick, Lorin Mercy, MD  Referring Physician: Leonides Sake, [place], [place] [address]  Chief complaint:  Crohn disease  HPI:  56 year old female with history of Crohn's ileitis on Humira here for follow-up visit. She continues to have intermittent diarrhea. GI pathogen panel and C. difficile negative. On most days she has 1-2 bowel movements with intermittent increased bowel frequency. Denies any blood or mucus in stool. Patient complains that she always feels hot and is sweating excessively. She also has excessive fatigue.   Prior history of present illness:  56 year old female with history of Crohn's ileitis diagnosed in 1991 status post terminal ileum resection here for follow-up visit. She was last seen in September 2017. Colonoscopy in January 2017 while on 6-MP maintenance dose 50 mg daily did not show any evidence of active inflammation. She was taken off 6-MP in June 2017 for abnormal LFTs, AST and ALT have since then trended down to normal range. Recently she had an episode of C. difficile colitis and completed treatment 2 weeks ago. Her bowel movements have returned to her baseline 2-3 semi-formed to soft BM daily. Denies any nausea, vomiting, abdominal pain, melena or bright red blood per rectum. She continues to have significant bloating and intermittent right lower quadrant abdominal pain.    Outpatient Encounter Prescriptions as of 02/22/2017  Medication Sig  . aspirin 81 MG chewable tablet Chew 81 mg by mouth daily.    . cyclobenzaprine (FLEXERIL) 10 MG tablet Take 0.5-1 tablets (5-10 mg total) by mouth 3 (three) times daily as needed for muscle spasms.  . enalapril (VASOTEC) 5 MG tablet Take 1 tablet (5 mg total) by mouth daily.  Marland Kitchen EPIPEN 2-PAK 0.3 MG/0.3ML SOAJ injection Reported on 06/27/2015  . fenofibrate (TRICOR) 145 MG tablet Take 1 tablet (145 mg total) by mouth  daily.  . fexofenadine (ALLEGRA) 180 MG tablet Take 1 tablet (180 mg total) by mouth daily.  Marland Kitchen levocetirizine (XYZAL) 5 MG tablet Take 5 mg by mouth every evening.  Marland Kitchen LORazepam (ATIVAN) 0.5 MG tablet Take 2 tablets (1 mg total) by mouth every 6 (six) hours as needed (Dizziness).  . metFORMIN (GLUCOPHAGE) 500 MG tablet Take 1 tablet (500 mg total) by mouth 2 (two) times daily with a meal.  . montelukast (SINGULAIR) 10 MG tablet Take 10 mg by mouth at bedtime.  Marland Kitchen NEXIUM 40 MG capsule TAKE 1 CAPSULE BY MOUTH ONCE DAILY  . zolpidem (AMBIEN) 10 MG tablet Take 10 mg by mouth at bedtime as needed for sleep.   . [DISCONTINUED] vancomycin (VANCOCIN HCL) 125 MG capsule Take 1 capsule (125 mg total) by mouth every 6 (six) hours.  Marland Kitchen HUMIRA PEN 40 MG/0.8ML PNKT Inject 40 mg into the skin every 14 (fourteen) days.  . rosuvastatin (CRESTOR) 20 MG tablet Take 1 tablet (20 mg total) by mouth daily.   No facility-administered encounter medications on file as of 02/22/2017.     Allergies as of 02/22/2017 - Review Complete 02/22/2017  Allergen Reaction Noted  . Lipitor [atorvastatin] Other (See Comments) 03/28/2013    Past Medical History:  Diagnosis Date  . Allergic rhinitis due to pollen   . Anxiety state, unspecified   . Arthritis   . Bronchitis    hx  . Calculus of gallbladder without mention of cholecystitis or obstruction   . Crohn disease ([place])   .  Depressive disorder, not elsewhere classified   . Diabetes mellitus without complication (HCC)    diet controlled  . Esophageal reflux   . Fibromyalgia   . Headache   . History of nuclear stress test    ETT-Myoview 10/17: EF 55%, normal perfusion; Low Risk  . Myalgia and myositis, unspecified   . Other and unspecified hyperlipidemia   . Palpitations   . Pneumonia 15   hx  . Unspecified essential hypertension   . Vitamin B deficiency     Past Surgical History:  Procedure Laterality Date  . CHOLECYSTECTOMY N/A 09/19/2014   Procedure:  LAPAROSCOPIC CHOLECYSTECTOMY WITH INTRAOPERATIVE CHOLANGIOGRAM;  Surgeon: Donnie Mesa, MD;  Location: [facility];  Service: General;  Laterality: N/A;  . ESOPHAGOGASTRODUODENOSCOPY    . EXPLORATORY LAPAROTOMY     for endometriosis  . TONSILLECTOMY AND ADENOIDECTOMY    . TUBAL LIGATION     BILATERAL    Family History  Problem Relation Age of Onset  . Lung cancer Maternal Grandmother   . Stroke Father   . Hypertension Mother   . Heart attack Mother   . Heart disease Mother   . Hypertension Brother   . Heart disease Brother   . Hypertension Sister   . Heart disease Sister   . Coronary artery disease Brother   . Colon cancer Neg Hx     Social History   Social History  . Marital status: Married    Spouse name: Dane  . Number of children: 2  . Years of education: 12+   Occupational History  . 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 ACCOUNT REP. [employer]   Social History Main Topics  . Smoking status: Former Smoker    Packs/day: 1.00    Years: 35.00    Types: Cigarettes    Quit date: 05/31/2004  . Smokeless tobacco: Never Used  . Alcohol use No  . Drug use: No  . Sexual activity: No     Comment: married   Other Topics Concern  . Not on file   Social History Narrative   Lives with her husband and their pets. Her children are adults and live independently.      Review of systems: Review of Systems  Constitutional: Negative for fever and chills. positive for lack of energy HENT: positive for sinus problems   Eyes: Negative for blurred vision.  Respiratory: Negative for shortness of breath and wheezing.  Positive for cough Cardiovascular: Negative for chest pain and palpitations.  Gastrointestinal: as per HPI Genitourinary: Negative for dysuria, urgency, frequency and hematuria.  Musculoskeletal: Positive for myalgias, back pain and joint pain.  Skin: Negative for itching and rash.  Neurological: Negative for dizziness, tremors, focal  weakness, seizures and loss of consciousness.  Endo/Heme/Allergies: Positive for seasonal allergies.  Psychiatric/Behavioral: Negative for depression, suicidal ideas and hallucinations.  All other systems reviewed and are negative.   Physical Exam: Vitals:   02/22/17 1329  BP: 116/68  Pulse: 72   Body mass index is 24.45 kg/m. Gen:      No acute distress HEENT:  EOMI, sclera anicteric Neck:     No masses; no thyromegaly Lungs:    Clear to auscultation bilaterally; normal respiratory effort CV:         Regular rate and rhythm; no murmurs Abd:      + bowel sounds; soft, non-tender; no palpable masses, no distension Ext:    No edema; adequate peripheral perfusion Skin:      Warm and  dry; no rash Neuro: alert and oriented x 3 Psych: normal mood and affect  Data Reviewed:  Reviewed labs, radiology imaging, old records and pertinent past GI work up   Assessment and Plan/Recommendations:  56 year old female with Crohn's disease status post terminal ileum resection on Humira here for follow-up visit Patient continues to have intermittent diarrhea Infectious workup negative Follow-up CBC, CMP and CRP Check TSH given history of excessive sweating, fatigue Lomotil 1 tablet 3 times daily as needed  Fatty liver with elevated AST and ALT Avoid alcohol and hepatotoxins   15 minutes was spent face-to-face with the patient. Greater than 50% of the time used for counseling as well as treatment plan and follow-up. She had multiple questions which were answered to her satisfaction  K. Denzil Magnuson , [place] [phone] Mon-Fri 8a-5p [phone] after 5p, weekends, holidays  CC: Hamrick, Lorin Mercy, MD

## 2017-02-22 NOTE — Patient Instructions (Signed)
Go to the basement today for labs  We have given you a printed prescription of Lomotil to take to your pharmacy

## 2017-02-28 ENCOUNTER — Other Ambulatory Visit: Payer: Self-pay

## 2017-02-28 DIAGNOSIS — R945 Abnormal results of liver function studies: Principal | ICD-10-CM

## 2017-02-28 DIAGNOSIS — R7989 Other specified abnormal findings of blood chemistry: Secondary | ICD-10-CM

## 2017-03-07 ENCOUNTER — Telehealth: Payer: Self-pay | Admitting: Gastroenterology

## 2017-03-07 NOTE — Telephone Encounter (Signed)
Patient wants to know if you feel CBD oil would be safe for her.

## 2017-03-07 NOTE — Telephone Encounter (Signed)
I am not sure, don't have much scientific data to support or refute

## 2017-03-07 NOTE — Telephone Encounter (Signed)
Left voicemail for the patient.

## 2017-03-28 ENCOUNTER — Other Ambulatory Visit: Payer: Self-pay | Admitting: Cardiology

## 2017-04-04 ENCOUNTER — Telehealth: Payer: Self-pay | Admitting: Physician Assistant

## 2017-04-04 NOTE — Telephone Encounter (Signed)
New Message  Pt call requesting to speak with RN about getting a sooner appt with only Richardson Dopp sooner than next available in January. Please call back to discuss

## 2017-04-04 NOTE — Telephone Encounter (Signed)
I s/w pt who was asking for a sooner than 05/2017 with Richardson Dopp, PA. I advised pt Brynda Rim. PA will be out of the office for about 6 weeks and not be available until 05/2017. Pt states she was told she needs appt because she is overdue and needed her fenofibrate refilled. Pt said refill was sent in. I advised her if she needs another refill before her appt to have the pharmacy call me. Pt is scheduled 06/07/17 to see PA.

## 2017-04-07 ENCOUNTER — Telehealth: Payer: Self-pay | Admitting: Gastroenterology

## 2017-04-07 NOTE — Telephone Encounter (Signed)
Left message for the patient to call back.

## 2017-04-07 NOTE — Telephone Encounter (Signed)
Patient reports she continues to have off and on diarrhea. She takes lomotil at least daily. Constantly feels like she needs to "be on the pot" to move her bowels. She will have 5 to 6 times a day that she is in the bathroom. Patient expresses frustration that she does not have as good control as she remembers having on 6MP. Abdomen hurts off and on. No bloody stools. She does not recall discussing any change of biologic.

## 2017-04-07 NOTE — Telephone Encounter (Signed)
Please check Humira drug trough and Ab level. Check CMP, CBC and CRP. Schedule office visit. Due to LFT abnormality 6MP was discontinued.

## 2017-04-08 ENCOUNTER — Other Ambulatory Visit: Payer: Self-pay

## 2017-04-08 ENCOUNTER — Other Ambulatory Visit (INDEPENDENT_AMBULATORY_CARE_PROVIDER_SITE_OTHER): Payer: BLUE CROSS/BLUE SHIELD

## 2017-04-08 DIAGNOSIS — K5010 Crohn's disease of large intestine without complications: Secondary | ICD-10-CM | POA: Diagnosis not present

## 2017-04-08 LAB — COMPREHENSIVE METABOLIC PANEL
ALT: 41 U/L — AB (ref 0–35)
AST: 31 U/L (ref 0–37)
Albumin: 4.4 g/dL (ref 3.5–5.2)
Alkaline Phosphatase: 83 U/L (ref 39–117)
BILIRUBIN TOTAL: 0.3 mg/dL (ref 0.2–1.2)
BUN: 17 mg/dL (ref 6–23)
CHLORIDE: 102 meq/L (ref 96–112)
CO2: 30 meq/L (ref 19–32)
Calcium: 9.8 mg/dL (ref 8.4–10.5)
Creatinine, Ser: 1.03 mg/dL (ref 0.40–1.20)
GFR: 58.33 mL/min — AB (ref >60.00)
GLUCOSE: 171 mg/dL — AB (ref 70–99)
Potassium: 3.8 mEq/L (ref 3.5–5.1)
Sodium: 140 mEq/L (ref 135–145)
Total Protein: 6.6 g/dL (ref 6.0–8.3)

## 2017-04-08 LAB — CBC WITH DIFFERENTIAL/PLATELET
BASOS PCT: 0.9 % (ref 0.0–3.0)
Basophils Absolute: 0.1 10*3/uL (ref 0.0–0.1)
EOS PCT: 1.4 % (ref 0.0–5.0)
Eosinophils Absolute: 0.1 10*3/uL (ref 0.0–0.7)
HEMATOCRIT: 40.3 % (ref 36.0–46.0)
HEMOGLOBIN: 13.5 g/dL (ref 12.0–15.0)
LYMPHS PCT: 33.3 % (ref 12.0–46.0)
Lymphs Abs: 2.6 10*3/uL (ref 0.7–4.0)
MCHC: 33.6 g/dL (ref 30.0–36.0)
MCV: 92.9 fl (ref 78.0–100.0)
MONOS PCT: 7.1 % (ref 3.0–12.0)
Monocytes Absolute: 0.5 10*3/uL (ref 0.1–1.0)
NEUTROS ABS: 4.5 10*3/uL (ref 1.4–7.7)
Neutrophils Relative %: 57.3 % (ref 43.0–77.0)
PLATELETS: 309 10*3/uL (ref 150.0–400.0)
RBC: 4.34 Mil/uL (ref 3.87–5.11)
RDW: 13.2 % (ref 11.5–15.5)
WBC: 7.8 10*3/uL (ref 4.0–10.5)

## 2017-04-08 LAB — HIGH SENSITIVITY CRP: CRP, High Sensitivity: 2.29 mg/L (ref 0.000–5.000)

## 2017-04-08 NOTE — Telephone Encounter (Signed)
Spoke with the patient. She reports she is due for her Humira injection tomorrow. She agrees to come in for her labs today. She is scheduled to return to the office on 04/28/17

## 2017-04-15 DIAGNOSIS — I214 Non-ST elevation (NSTEMI) myocardial infarction: Secondary | ICD-10-CM

## 2017-04-15 HISTORY — DX: Non-ST elevation (NSTEMI) myocardial infarction: I21.4

## 2017-04-16 ENCOUNTER — Encounter (HOSPITAL_COMMUNITY): Payer: Self-pay

## 2017-04-16 ENCOUNTER — Inpatient Hospital Stay (HOSPITAL_COMMUNITY)
Admission: EM | Admit: 2017-04-16 | Discharge: 2017-04-19 | DRG: 247 | Disposition: A | Discharge status: Home / Self Care | Payer: BLUE CROSS/BLUE SHIELD | Attending: Cardiology | Admitting: Cardiology

## 2017-04-16 ENCOUNTER — Encounter: Payer: Self-pay | Admitting: Family Medicine

## 2017-04-16 ENCOUNTER — Emergency Department (HOSPITAL_COMMUNITY): Payer: BLUE CROSS/BLUE SHIELD

## 2017-04-16 ENCOUNTER — Other Ambulatory Visit: Payer: Self-pay

## 2017-04-16 ENCOUNTER — Ambulatory Visit (INDEPENDENT_AMBULATORY_CARE_PROVIDER_SITE_OTHER): Payer: BLUE CROSS/BLUE SHIELD | Admitting: Family Medicine

## 2017-04-16 VITALS — BP 123/79 | HR 91 | Temp 98.3°F | Resp 17 | Ht 63.0 in | Wt 143.2 lb

## 2017-04-16 DIAGNOSIS — Z955 Presence of coronary angioplasty implant and graft: Secondary | ICD-10-CM

## 2017-04-16 DIAGNOSIS — R0602 Shortness of breath: Secondary | ICD-10-CM

## 2017-04-16 DIAGNOSIS — E119 Type 2 diabetes mellitus without complications: Secondary | ICD-10-CM

## 2017-04-16 DIAGNOSIS — M797 Fibromyalgia: Secondary | ICD-10-CM | POA: Diagnosis present

## 2017-04-16 DIAGNOSIS — E785 Hyperlipidemia, unspecified: Secondary | ICD-10-CM | POA: Diagnosis present

## 2017-04-16 DIAGNOSIS — E1169 Type 2 diabetes mellitus with other specified complication: Secondary | ICD-10-CM | POA: Diagnosis present

## 2017-04-16 DIAGNOSIS — Z87891 Personal history of nicotine dependence: Secondary | ICD-10-CM | POA: Diagnosis not present

## 2017-04-16 DIAGNOSIS — I214 Non-ST elevation (NSTEMI) myocardial infarction: Secondary | ICD-10-CM | POA: Diagnosis not present

## 2017-04-16 DIAGNOSIS — E7800 Pure hypercholesterolemia, unspecified: Secondary | ICD-10-CM | POA: Diagnosis present

## 2017-04-16 DIAGNOSIS — Z79899 Other long term (current) drug therapy: Secondary | ICD-10-CM

## 2017-04-16 DIAGNOSIS — I25110 Atherosclerotic heart disease of native coronary artery with unstable angina pectoris: Secondary | ICD-10-CM | POA: Diagnosis not present

## 2017-04-16 DIAGNOSIS — Z7982 Long term (current) use of aspirin: Secondary | ICD-10-CM

## 2017-04-16 DIAGNOSIS — I10 Essential (primary) hypertension: Secondary | ICD-10-CM | POA: Diagnosis present

## 2017-04-16 DIAGNOSIS — R071 Chest pain on breathing: Secondary | ICD-10-CM

## 2017-04-16 DIAGNOSIS — K5090 Crohn's disease, unspecified, without complications: Secondary | ICD-10-CM | POA: Diagnosis present

## 2017-04-16 DIAGNOSIS — Z888 Allergy status to other drugs, medicaments and biological substances status: Secondary | ICD-10-CM | POA: Diagnosis not present

## 2017-04-16 DIAGNOSIS — E782 Mixed hyperlipidemia: Secondary | ICD-10-CM | POA: Diagnosis not present

## 2017-04-16 DIAGNOSIS — F329 Major depressive disorder, single episode, unspecified: Secondary | ICD-10-CM | POA: Diagnosis present

## 2017-04-16 DIAGNOSIS — R079 Chest pain, unspecified: Secondary | ICD-10-CM | POA: Diagnosis present

## 2017-04-16 DIAGNOSIS — K219 Gastro-esophageal reflux disease without esophagitis: Secondary | ICD-10-CM | POA: Diagnosis not present

## 2017-04-16 DIAGNOSIS — Z8249 Family history of ischemic heart disease and other diseases of the circulatory system: Secondary | ICD-10-CM

## 2017-04-16 DIAGNOSIS — F411 Generalized anxiety disorder: Secondary | ICD-10-CM | POA: Diagnosis not present

## 2017-04-16 DIAGNOSIS — E539 Vitamin B deficiency, unspecified: Secondary | ICD-10-CM | POA: Diagnosis not present

## 2017-04-16 DIAGNOSIS — I2510 Atherosclerotic heart disease of native coronary artery without angina pectoris: Secondary | ICD-10-CM | POA: Diagnosis present

## 2017-04-16 DIAGNOSIS — Z7984 Long term (current) use of oral hypoglycemic drugs: Secondary | ICD-10-CM | POA: Diagnosis not present

## 2017-04-16 DIAGNOSIS — Z95820 Peripheral vascular angioplasty status with implants and grafts: Secondary | ICD-10-CM

## 2017-04-16 DIAGNOSIS — I4891 Unspecified atrial fibrillation: Secondary | ICD-10-CM | POA: Diagnosis present

## 2017-04-16 DIAGNOSIS — R0600 Dyspnea, unspecified: Secondary | ICD-10-CM | POA: Diagnosis not present

## 2017-04-16 HISTORY — DX: Peripheral vascular angioplasty status with implants and grafts: Z95.820

## 2017-04-16 HISTORY — DX: Non-ST elevation (NSTEMI) myocardial infarction: I21.4

## 2017-04-16 LAB — GLUCOSE, CAPILLARY: Glucose-Capillary: 198 mg/dL — ABNORMAL HIGH (ref 65–99)

## 2017-04-16 LAB — I-STAT TROPONIN, ED
TROPONIN I, POC: 0.15 ng/mL — AB (ref 0.00–0.08)
Troponin i, poc: 0.16 ng/mL (ref 0.00–0.08)

## 2017-04-16 LAB — HEPARIN LEVEL (UNFRACTIONATED): Heparin Unfractionated: 0.17 IU/mL — ABNORMAL LOW (ref 0.30–0.70)

## 2017-04-16 LAB — D-DIMER, QUANTITATIVE (NOT AT ARMC)

## 2017-04-16 LAB — BASIC METABOLIC PANEL
ANION GAP: 7 (ref 5–15)
BUN: 13 mg/dL (ref 6–20)
CHLORIDE: 106 mmol/L (ref 101–111)
CO2: 24 mmol/L (ref 22–32)
Calcium: 9.1 mg/dL (ref 8.9–10.3)
Creatinine, Ser: 0.6 mg/dL (ref 0.44–1.00)
GFR calc Af Amer: >60 mL/min (ref >60)
GFR calc non Af Amer: >60 mL/min (ref >60)
GLUCOSE: 186 mg/dL — AB (ref 65–99)
POTASSIUM: 3.7 mmol/L (ref 3.5–5.1)
Sodium: 137 mmol/L (ref 135–145)

## 2017-04-16 LAB — HEPATIC FUNCTION PANEL
ALT: 29 U/L (ref 14–54)
AST: 35 U/L (ref 15–41)
Albumin: 3.8 g/dL (ref 3.5–5.0)
Alkaline Phosphatase: 72 U/L (ref 38–126)
BILIRUBIN DIRECT: 0.1 mg/dL (ref 0.1–0.5)
BILIRUBIN TOTAL: 0.5 mg/dL (ref 0.3–1.2)
Indirect Bilirubin: 0.4 mg/dL (ref 0.3–0.9)
Total Protein: 6 g/dL — ABNORMAL LOW (ref 6.5–8.1)

## 2017-04-16 LAB — CBC
HEMATOCRIT: 40.3 % (ref 36.0–46.0)
HEMOGLOBIN: 13.5 g/dL (ref 12.0–15.0)
MCH: 30.8 pg (ref 26.0–34.0)
MCHC: 33.5 g/dL (ref 30.0–36.0)
MCV: 91.8 fL (ref 78.0–100.0)
Platelets: 271 10*3/uL (ref 150–400)
RBC: 4.39 MIL/uL (ref 3.87–5.11)
RDW: 12.6 % (ref 11.5–15.5)
WBC: 10 10*3/uL (ref 4.0–10.5)

## 2017-04-16 LAB — CBG MONITORING, ED: Glucose-Capillary: 170 mg/dL — ABNORMAL HIGH (ref 65–99)

## 2017-04-16 LAB — MAGNESIUM: Magnesium: 1.8 mg/dL (ref 1.7–2.4)

## 2017-04-16 LAB — I-STAT BETA HCG BLOOD, ED (MC, WL, AP ONLY)

## 2017-04-16 LAB — HEMOGLOBIN A1C
Hgb A1c MFr Bld: 7.5 % — ABNORMAL HIGH (ref 4.8–5.6)
Mean Plasma Glucose: 168.55 mg/dL

## 2017-04-16 LAB — ADALIMUMAB LEVEL AND ANTIBODY: ANTI-ADALIMUMAB ANTIBODY: 1024 ng/mL

## 2017-04-16 LAB — TSH: TSH: 3.272 u[IU]/mL (ref 0.350–4.500)

## 2017-04-16 LAB — LIPASE, BLOOD: Lipase: 29 U/L (ref 11–51)

## 2017-04-16 MED ORDER — ROSUVASTATIN CALCIUM 20 MG PO TABS
20.0000 mg | ORAL_TABLET | Freq: Every day | ORAL | Status: DC
  Administered 2017-04-16 – 2017-04-17 (×2): 20 mg via ORAL
  Filled 2017-04-16 (×7): qty 1

## 2017-04-16 MED ORDER — HEPARIN (PORCINE) IN NACL 100-0.45 UNIT/ML-% IJ SOLN
1300.0000 [IU]/h | INTRAMUSCULAR | Status: DC
  Administered 2017-04-16: 800 [IU]/h via INTRAVENOUS
  Administered 2017-04-17: 1100 [IU]/h via INTRAVENOUS
  Filled 2017-04-16 (×2): qty 250

## 2017-04-16 MED ORDER — ENALAPRIL MALEATE 5 MG PO TABS: 5.0000 mg | ORAL_TABLET | Freq: Every day | ORAL | Status: DC

## 2017-04-16 MED ORDER — VITAMIN C 500 MG PO TABS
1000.0000 mg | ORAL_TABLET | Freq: Every day | ORAL | Status: DC
  Administered 2017-04-17: 1000 mg via ORAL
  Filled 2017-04-16 (×4): qty 2

## 2017-04-16 MED ORDER — LORAZEPAM 0.5 MG PO TABS: 1.0000 mg | ORAL_TABLET | Freq: Four times a day (QID) | ORAL | Status: DC | PRN

## 2017-04-16 MED ORDER — ZOLPIDEM TARTRATE 5 MG PO TABS: 10.0000 mg | ORAL_TABLET | Freq: Every evening | ORAL | Status: DC | PRN

## 2017-04-16 MED ORDER — MONTELUKAST SODIUM 10 MG PO TABS
10.0000 mg | ORAL_TABLET | Freq: Every day | ORAL | Status: DC
  Administered 2017-04-16 – 2017-04-18 (×3): 10 mg via ORAL
  Filled 2017-04-16 (×4): qty 1

## 2017-04-16 MED ORDER — ZOLPIDEM TARTRATE 5 MG PO TABS: 5.0000 mg | ORAL_TABLET | Freq: Every evening | ORAL | Status: DC | PRN

## 2017-04-16 MED ORDER — ASPIRIN 81 MG PO CHEW
81.0000 mg | CHEWABLE_TABLET | Freq: Every day | ORAL | Status: DC
  Administered 2017-04-17: 81 mg via ORAL
  Filled 2017-04-16: qty 1

## 2017-04-16 MED ORDER — PANTOPRAZOLE SODIUM 40 MG PO TBEC
40.0000 mg | DELAYED_RELEASE_TABLET | Freq: Every day | ORAL | Status: DC
  Administered 2017-04-16 – 2017-04-17 (×2): 40 mg via ORAL
  Filled 2017-04-16 (×4): qty 1

## 2017-04-16 MED ORDER — ASPIRIN 81 MG PO CHEW
324.0000 mg | CHEWABLE_TABLET | Freq: Once | ORAL | Status: DC
  Administered 2017-04-16: 324 mg via ORAL

## 2017-04-16 MED ORDER — VITAMIN E 45 MG (100 UNIT) PO CAPS
1000.0000 [IU] | ORAL_CAPSULE | Freq: Every day | ORAL | Status: DC
  Administered 2017-04-17: 1000 [IU] via ORAL
  Filled 2017-04-16 (×4): qty 2

## 2017-04-16 MED ORDER — LORATADINE 10 MG PO TABS: 10.0000 mg | ORAL_TABLET | Freq: Every day | ORAL | Status: DC

## 2017-04-16 MED ORDER — FENOFIBRATE 160 MG PO TABS
160.0000 mg | ORAL_TABLET | Freq: Every day | ORAL | Status: DC
  Administered 2017-04-16 – 2017-04-17 (×2): 160 mg via ORAL
  Filled 2017-04-16 (×5): qty 1

## 2017-04-16 MED ORDER — METFORMIN HCL 500 MG PO TABS
500.0000 mg | ORAL_TABLET | Freq: Two times a day (BID) | ORAL | Status: DC
  Administered 2017-04-17 (×2): 500 mg via ORAL
  Filled 2017-04-16 (×2): qty 1

## 2017-04-16 MED ORDER — NITROGLYCERIN 0.3 MG SL SUBL: 0.4000 mg | SUBLINGUAL_TABLET | SUBLINGUAL | Status: DC | PRN

## 2017-04-16 MED ORDER — METOPROLOL SUCCINATE ER 25 MG PO TB24
25.0000 mg | ORAL_TABLET | Freq: Every day | ORAL | Status: DC
  Administered 2017-04-16 – 2017-04-17 (×2): 25 mg via ORAL
  Filled 2017-04-16 (×5): qty 1

## 2017-04-16 MED ORDER — HEPARIN BOLUS VIA INFUSION
4000.0000 [IU] | Freq: Once | INTRAVENOUS | Status: AC
Start: 2017-04-16 — End: 2017-04-16
  Administered 2017-04-16: 4000 [IU] via INTRAVENOUS
  Filled 2017-04-16: qty 4000

## 2017-04-16 MED ORDER — CYCLOBENZAPRINE HCL 10 MG PO TABS: 5.0000 mg | ORAL_TABLET | Freq: Three times a day (TID) | ORAL | Status: DC | PRN

## 2017-04-16 MED ORDER — DIPHENOXYLATE-ATROPINE 2.5-0.025 MG PO TABS
1.0000 | ORAL_TABLET | Freq: Three times a day (TID) | ORAL | Status: DC | PRN
  Administered 2017-04-16 – 2017-04-18 (×2): 1 via ORAL
  Filled 2017-04-16 (×3): qty 1

## 2017-04-16 MED ORDER — NITROGLYCERIN 0.4 MG SL SUBL: 0.4000 mg | SUBLINGUAL_TABLET | SUBLINGUAL | Status: DC | PRN

## 2017-04-16 MED ORDER — ENALAPRIL MALEATE 5 MG PO TABS
5.0000 mg | ORAL_TABLET | Freq: Every day | ORAL | Status: DC
  Administered 2017-04-16 – 2017-04-17 (×2): 5 mg via ORAL
  Filled 2017-04-16 (×4): qty 1

## 2017-04-16 MED ORDER — LORATADINE 10 MG PO TABS
10.0000 mg | ORAL_TABLET | Freq: Every evening | ORAL | Status: DC
  Administered 2017-04-16 – 2017-04-17 (×2): 10 mg via ORAL
  Filled 2017-04-16 (×3): qty 1

## 2017-04-16 MED ORDER — ASPIRIN 81 MG PO CHEW: 324.0000 mg | CHEWABLE_TABLET | Freq: Once | ORAL | Status: DC

## 2017-04-16 MED ORDER — HEPARIN BOLUS VIA INFUSION
2000.0000 [IU] | Freq: Once | INTRAVENOUS | Status: AC
  Administered 2017-04-16: 2000 [IU] via INTRAVENOUS
  Filled 2017-04-16: qty 2000

## 2017-04-16 NOTE — ED Provider Notes (Addendum)
[place] EMERGENCY DEPARTMENT Provider Note   CSN: [id number] Arrival date & time: 04/16/17  1254     History   Chief Complaint Chief Complaint  Patient presents with  . Chest Pain    HPI [name] is a 56 y.o. female.  HPI  56 year old female history of type 2 diabetes, Crohn's disease, high cholesterol presents today from urgent care office with reports of intermittent chest pain with exertion for the past 3 days.  She describes it as coming on with any exertion and resolves when she is able to sit down and rest.  It is substernal in nature.  Describes it as pressure.  She has some associated dyspnea.  She has seen a cardiologist in the past but denies known coronary artery disease.  She denies smoking.  Patient with prehospital EKG obtained at urgent care appears consistent with intermittent atrial fibrillation.  Past Medical History:  Diagnosis Date  . Allergic rhinitis due to pollen   . Anxiety state, unspecified   . Arthritis   . Bronchitis    hx  . Calculus of gallbladder without mention of cholecystitis or obstruction   . Crohn disease ([place])   . Depressive disorder, not elsewhere classified   . Diabetes mellitus without complication (HCC)    diet controlled  . Esophageal reflux   . Fibromyalgia   . Headache   . History of nuclear stress test    ETT-Myoview 10/17: EF 55%, normal perfusion; Low Risk  . Myalgia and myositis, unspecified   . Other and unspecified hyperlipidemia   . Palpitations   . Pneumonia 15   hx  . Unspecified essential hypertension   . Vitamin B deficiency     Patient Active Problem List   Diagnosis Date Noted  . Fatty infiltration of liver 12/23/2015  . CAD (coronary artery disease) 10/10/2011  . Diabetes mellitus type 2, uncomplicated ([place]) 40/97/3532  . ESSENTIAL HYPERTENSION, BENIGN 08/18/2009  . CHEST PAIN UNSPECIFIED 08/01/2009  . B12 DEFICIENCY 01/06/2009  . Hyperlipidemia 03/16/2007  . ANXIETY  03/16/2007  . DEPRESSION 03/16/2007  . Seasonal and perennial allergic rhinitis 03/16/2007  . GERD 03/16/2007  . CHOLELITHIASIS W/O CHOLECYSTITIS W/O OBST 03/16/2007  . SACROILIITIS NEC 03/16/2007  . FIBROMYALGIA 03/16/2007  . PALPITATIONS 03/16/2007  . Crohn's disease ([place]) 03/16/2007    Past Surgical History:  Procedure Laterality Date  . ESOPHAGOGASTRODUODENOSCOPY    . EXPLORATORY LAPAROTOMY     for endometriosis  . LAPAROSCOPIC CHOLECYSTECTOMY WITH INTRAOPERATIVE CHOLANGIOGRAM N/A 09/19/2014   Performed by Donnie Mesa, MD at [facility] OR  . TONSILLECTOMY AND ADENOIDECTOMY    . TUBAL LIGATION     BILATERAL    OB History    No data available       Home Medications    Prior to Admission medications   Medication Sig Start Date End Date Taking? Authorizing Provider  aspirin 81 MG chewable tablet Chew 81 mg by mouth daily.      [provider]  cyclobenzaprine (FLEXERIL) 10 MG tablet Take 0.5-1 tablets (5-10 mg total) by mouth 3 (three) times daily as needed for muscle spasms. 01/11/13   Harrison Mons, PA-C  diphenoxylate-atropine (LOMOTIL) 2.5-0.025 MG tablet Take 1 tablet by mouth 3 (three) times daily. 02/22/17   Mauri Pole, MD  enalapril (VASOTEC) 5 MG tablet Take 1 tablet (5 mg total) by mouth daily. 06/24/14   Larey Dresser, MD  EPIPEN 2-PAK 0.3 MG/0.3ML SOAJ injection Reported on 06/27/2015 10/09/14  [provider]  fenofibrate (TRICOR) 145 MG tablet Take 1 tablet (145 mg total) by mouth daily. Please make an appt with the Doctor before anymore refills. 1st attempt 03/29/17   Larey Dresser, MD  fexofenadine (ALLEGRA) 180 MG tablet Take 1 tablet (180 mg total) by mouth daily. 12/03/14   Jeffery, Chelle, PA-C  HUMIRA PEN 40 MG/0.8ML PNKT Inject 40 mg into the skin every 14 (fourteen) days. 02/03/17   [provider]  levocetirizine (XYZAL) 5 MG tablet Take 5 mg by mouth every evening.    [provider]  LORazepam (ATIVAN) 0.5 MG  tablet Take 2 tablets (1 mg total) by mouth every 6 (six) hours as needed (Dizziness). 12/19/16   Daleen Bo, MD  metFORMIN (GLUCOPHAGE) 500 MG tablet Take 1 tablet (500 mg total) by mouth 2 (two) times daily with a meal. 12/03/14   Jeffery, Chelle, PA-C  montelukast (SINGULAIR) 10 MG tablet Take 10 mg by mouth at bedtime.    [provider]  NEXIUM 40 MG capsule TAKE 1 CAPSULE BY MOUTH ONCE DAILY 05/05/15   Mauri Pole, MD  rosuvastatin (CRESTOR) 20 MG tablet Take 1 tablet (20 mg total) by mouth daily. 07/13/16 10/11/16  Richardson Dopp T, PA-C  zolpidem (AMBIEN) 10 MG tablet Take 10 mg by mouth at bedtime as needed for sleep.     [provider]    Family History Family History  Problem Relation Age of Onset  . Lung cancer Maternal Grandmother   . Stroke Father   . Hypertension Mother   . Heart attack Mother   . Heart disease Mother   . Hypertension Brother   . Heart disease Brother   . Hypertension Sister   . Heart disease Sister   . Coronary artery disease Brother   . Colon cancer Neg Hx     Social History Social History   Tobacco Use  . Smoking status: Former Smoker    Packs/day: 1.00    Years: 35.00    Pack years: 35.00    Types: Cigarettes    Last attempt to quit: 05/31/2004    Years since quitting: 12.8  . Smokeless tobacco: Never Used  Substance Use Topics  . Alcohol use: No  . Drug use: No     Allergies   Lipitor [atorvastatin]   Review of Systems Review of Systems  All other systems reviewed and are negative.    Physical Exam Updated Vital Signs Ht 1.6 m (5' 3" )   Wt 64.9 kg (143 lb)   LMP  (LMP Unknown)   SpO2 97%   BMI 25.33 kg/m   Physical Exam  Constitutional: She is oriented to person, place, and time. She appears well-developed and well-nourished. She does not appear ill.  HENT:  Head: Normocephalic and atraumatic.  Eyes: EOM are normal. Pupils are equal, round, and reactive to light.  Neck: Normal range of motion.  Neck supple.  Cardiovascular: Normal rate, regular rhythm and normal pulses.  Pulmonary/Chest: Effort normal and breath sounds normal.  Abdominal: Soft.  Musculoskeletal: Normal range of motion.       Right lower leg: Normal.       Left lower leg: Normal.  Neurological: She is alert and oriented to person, place, and time. No cranial nerve deficit.  Skin: Skin is warm and dry. Capillary refill takes less than 2 seconds.  Psychiatric: She has a normal mood and affect. Her behavior is normal.  Nursing note and vitals reviewed.  ED Treatments / Results  Labs (all labs ordered are listed, but only abnormal results are displayed) Labs Reviewed - No data to display  EKG  EKG Interpretation  Date/Time:  Saturday April 16 2017 13:28:03 EST Ventricular Rate:  86 PR Interval:    QRS Duration: 106 QT Interval:  519 QTC Calculation: 621 R Axis:   112 Text Interpretation:  Normal sinus rhythm Non-specific ST-t changes v3-v6 Confirmed by Pattricia Boss [phone]) on 04/16/2017 1:39:40 PM       Radiology Dg Chest Portable 1 View  Result Date: 04/16/2017 CLINICAL DATA:  Chest pain and shortness of breath EXAM: PORTABLE CHEST 1 VIEW COMPARISON:  05/17/2014 FINDINGS: The heart size and mediastinal contours are within normal limits. Both lungs are clear. Minimal scarring is noted in the left base stable from the prior study. The visualized skeletal structures are unremarkable. IMPRESSION: No active disease. Electronically Signed   By: Inez Catalina M.D.   On: 04/16/2017 14:03    Procedures Procedures (including critical care time)  Medications Ordered in ED Medications - No data to display   Initial Impression / Assessment and Plan / ED Course  I have reviewed the triage vital signs and the nursing notes.  Pertinent labs & imaging results that were available during my care of the patient were reviewed by me and considered in my medical decision making (see chart for details).      2:28 PM Patient states no pain except when she moves.  Elevated troponin noted.  Plan heparin. Cardiology paged. 2:59 PM Discussed with Dr. Meda Coffee and cardiology will see for admission CRITICAL CARE Performed by: Pattricia Boss Total critical care time: 30 minutes Critical care time was exclusive of separately billable procedures and treating other patients. Critical care was necessary to treat or prevent imminent or life-threatening deterioration. Critical care was time spent personally by me on the following activities: development of treatment plan with patient and/or surrogate as well as nursing, discussions with consultants, evaluation of patient's response to treatment, examination of patient, obtaining history from patient or surrogate, ordering and performing treatments and interventions, ordering and review of laboratory studies, ordering and review of radiographic studies, pulse oximetry and re-evaluation of patient's condition.  Final Clinical Impressions(s) / ED Diagnoses   Final diagnoses:  NSTEMI (non-ST elevated myocardial infarction) [facility])    ED Discharge Orders    None       Pattricia Boss, MD 04/16/17 1459    Pattricia Boss, MD 04/16/17 1500

## 2017-04-16 NOTE — Patient Instructions (Addendum)
Nonspecific Chest Pain °Chest pain can be caused by many different conditions. There is always a chance that your pain could be related to something serious, such as a heart attack or a blood clot in your lungs. Chest pain can also be caused by conditions that are not life-threatening. If you have chest pain, it is very important to follow up with your health care provider. °What are the causes? °Causes of this condition include: °· Heartburn. °· Pneumonia or bronchitis. °· Anxiety or stress. °· Inflammation around your heart (pericarditis) or lung (pleuritis or pleurisy). °· A blood clot in your lung. °· A collapsed lung (pneumothorax). This can develop suddenly on its own (spontaneous pneumothorax) or from trauma to the chest. °· Shingles infection (varicella-zoster virus). °· Heart attack. °· Damage to the bones, muscles, and cartilage that make up your chest wall. This can include: °? Bruised bones due to injury. °? Strained muscles or cartilage due to frequent or repeated coughing or overwork. °? Fracture to one or more ribs. °? Sore cartilage due to inflammation (costochondritis). ° °What increases the risk? °Risk factors for this condition may include: °· Activities that increase your risk for trauma or injury to your chest. °· Respiratory infections or conditions that cause frequent coughing. °· Medical conditions or overeating that can cause heartburn. °· Heart disease or family history of heart disease. °· Conditions or health behaviors that increase your risk of developing a blood clot. °· Having had chicken pox (varicella zoster). ° °What are the signs or symptoms? °Chest pain can feel like: °· Burning or tingling on the surface of your chest or deep in your chest. °· Crushing, pressure, aching, or squeezing pain. °· Dull or sharp pain that is worse when you move, cough, or take a deep breath. °· Pain that is also felt in your back, neck, shoulder, or arm, or pain that spreads to any of these  areas. ° °Your chest pain may come and go, or it may stay constant. °How is this diagnosed? °Lab tests or other studies may be needed to find the cause of your pain. Your health care provider may have you take a test called an ECG (electrocardiogram). An ECG records your heartbeat patterns at the time the test is performed. You may also have other tests, such as: °· Transthoracic echocardiogram (TTE). In this test, sound waves are used to create a picture of the heart structures and to look at how blood flows through your heart. °· Transesophageal echocardiogram (TEE). This is a more advanced imaging test that takes images from inside your body. It allows your health care provider to see your heart in finer detail. °· Cardiac monitoring. This allows your health care provider to monitor your heart rate and rhythm in real time. °· Holter monitor. This is a portable device that records your heartbeat and can help to diagnose abnormal heartbeats. It allows your health care provider to track your heart activity for several days, if needed. °· Stress tests. These can be done through exercise or by taking medicine that makes your heart beat more quickly. °· Blood tests. °· Other imaging tests. ° °How is this treated? °Treatment depends on what is causing your chest pain. Treatment may include: °· Medicines. These may include: °? Acid blockers for heartburn. °? Anti-inflammatory medicine. °? Pain medicine for inflammatory conditions. °? Antibiotic medicine, if an infection is present. °? Medicines to dissolve blood clots. °? Medicines to treat coronary artery disease (CAD). °· Supportive care for conditions that   do not require medicines. This may include: °? Resting. °? Applying heat or cold packs to injured areas. °? Limiting activities until pain decreases. ° °Follow these instructions at home: °Medicines °· If you were prescribed an antibiotic, take it as told by your health care provider. Do not stop taking the  antibiotic even if you start to feel better. °· Take over-the-counter and prescription medicines only as told by your health care provider. °Lifestyle °· Do not use any products that contain nicotine or tobacco, such as cigarettes and e-cigarettes. If you need help quitting, ask your health care provider. °· Do not drink alcohol. °· Make lifestyle changes as directed by your health care provider. These may include: °? Getting regular exercise. Ask your health care provider to suggest some activities that are safe for you. °? Eating a heart-healthy diet. A registered dietitian can help you to learn healthy eating options. °? Maintaining a healthy weight. °? Managing diabetes, if necessary. °? Reducing stress, such as with yoga or relaxation techniques. °General instructions °· Avoid any activities that bring on chest pain. °· If heartburn is the cause for your chest pain, raise (elevate) the head of your bed about 6 inches (15 cm) by putting blocks under the legs. Sleeping with more pillows does not effectively relieve heartburn because it only changes the position of your head. °· Keep all follow-up visits as told by your health care provider. This is important. This includes any further testing if your chest pain does not go away. °Contact a health care provider if: °· Your chest pain does not go away. °· You have a rash with blisters on your chest. °· You have a fever. °· You have chills. °Get help right away if: °· Your chest pain is worse. °· You have a cough that gets worse, or you cough up blood. °· You have severe pain in your abdomen. °· You have severe weakness. °· You faint. °· You have sudden, unexplained chest discomfort. °· You have sudden, unexplained discomfort in your arms, back, neck, or jaw. °· You have shortness of breath at any time. °· You suddenly start to sweat, or your skin gets clammy. °· You feel nauseous or you vomit. °· You suddenly feel light-headed or dizzy. °· Your heart begins to beat  quickly, or it feels like it is skipping beats. °These symptoms may represent a serious problem that is an emergency. Do not wait to see if the symptoms will go away. Get medical help right away. Call your local emergency services (911 in the [place] Do not drive yourself to the hospital. °This information is not intended to replace advice given to you by your health care provider. Make sure you discuss any questions you have with your health care provider. °Document Released: 02/24/2005 Document Revised: 02/09/2016 Document Reviewed: 02/09/2016 °Elsevier Interactive Patient Education © 2017 [employer] ° °

## 2017-04-16 NOTE — ED Triage Notes (Signed)
PT arrived via [facility] from [place] where pt was being seen by her PCP for CP evaluation that started on Thursday. Pt reports CP, SOB, and back pain on excertion. Pt also reports that symptoms are worse when lying flat. Pt also states she has reflux and has some relief with burping. Pt has not had any home medications today.

## 2017-04-16 NOTE — ED Notes (Signed)
ED Provider at bedside. 

## 2017-04-16 NOTE — Progress Notes (Addendum)
ANTICOAGULATION CONSULT NOTE - Follow Up Consult  Pharmacy Consult for heparin Indication: chest pain/ACS  Labs: Recent Labs    04/16/17 1330 04/16/17 2200  HGB 13.5  --   HCT 40.3  --   PLT 271  --   HEPARINUNFRC  --  0.17*  CREATININE 0.60  --     Assessment: 56yo female subtherapeutic on heparin with initial dosing for CP.  Goal of Therapy:  Heparin level 0.3-0.7 units/ml   Plan:  Will rebolus with heparin 2000 units and increase gtt by 3-4 units/kg/hr to 1000 units/hr and check level in 6hr.  Wynona Neat, PharmD, BCPS  04/16/2017,10:52 PM   ADDENDUM: Heparin level now 0.29.  Will increase heparin gtt slightly to 1100 units/hr and check level in 6hr. VB    04/17/2017    5:09 AM

## 2017-04-16 NOTE — Consult Note (Addendum)
Cardiology Consultation:   Patient ID: [id number] DANTES; [id number]; 12-16-1960   Admit date: 04/16/2017 Date of Consult: 04/16/2017  Primary Care Provider: Leonides Sake, MD Primary Cardiologist: Loralie Champagne MD   Patient Profile:   [name] is a 56 y.o. female with a hx of coronary artery disease, GERD, hyperlipidemia, frequent palpitations, Crohn's disease,  who is being seen today for the evaluation of chest pain at the request of Dr.Ray.  History of Present Illness:   [name] who is followed in our office by Dr. Algernon Huxley for recurrent chest pain she was last seen in our office 1 year ago and was stable.  The patient was seen in the emergency room with complaints of chest pain and worsening dyspnea on exertion.  The patient states she often feels chest pain when she bends over, moves her torso, or has minimal exertion.  Her breathing status is worsened stating that she is having more more trouble breathing as of late.  The patient also states that she has been feeling her legs become weaker.  Climbing stairs is become an effort for her.  He states approximately 4 days ago she had a stressful situation at work, she began to have some chest pressure and aching radiating through to her back.  Went away on its own.  Since that time she has been having intermittent discomfort in her chest.  Main complaint, is worsening dyspnea.  On arrival blood pressure 123/79 heart rate 91 and afebrile.  Labs revealed elevated blood glucose at 170.  Troponin 0 0.16 on admission EKG revealed normal sinus rhythm with nonspecific T wave changes in the anterior lateral, with also T wave inversion.  Pertinent labs hemoglobin 13.5 hematocrit 40.3 white blood cells 10.0 platelets 271.  Potassium 3.7, creatinine 0.60.  Revealed no active disease.  Past Medical History:  Diagnosis Date  . Allergic rhinitis due to pollen   . Anxiety state, unspecified   . Arthritis   . Bronchitis    hx  . Calculus of  gallbladder without mention of cholecystitis or obstruction   . Crohn disease ([place])   . Depressive disorder, not elsewhere classified   . Diabetes mellitus without complication (HCC)    diet controlled  . Esophageal reflux   . Fibromyalgia   . Headache   . History of nuclear stress test    ETT-Myoview 10/17: EF 55%, normal perfusion; Low Risk  . Myalgia and myositis, unspecified   . Other and unspecified hyperlipidemia   . Palpitations   . Pneumonia 15   hx  . Unspecified essential hypertension   . Vitamin B deficiency     Past Surgical History:  Procedure Laterality Date  . ESOPHAGOGASTRODUODENOSCOPY    . EXPLORATORY LAPAROTOMY     for endometriosis  . LAPAROSCOPIC CHOLECYSTECTOMY WITH INTRAOPERATIVE CHOLANGIOGRAM N/A 09/19/2014   Performed by Donnie Mesa, MD at [facility] OR  . TONSILLECTOMY AND ADENOIDECTOMY    . TUBAL LIGATION     BILATERAL     Home Medications:  Prior to Admission medications   Medication Sig Start Date End Date Taking? Authorizing Provider  Ascorbic Acid (VITAMIN C PO) Take 1,000 mg daily by mouth.   Yes [provider]  aspirin 81 MG chewable tablet Chew 81 mg by mouth daily.     Yes [provider]  enalapril (VASOTEC) 5 MG tablet Take 1 tablet (5 mg total) by mouth daily. 06/24/14  Yes Larey Dresser, MD  fenofibrate (TRICOR) 145 MG tablet Take  1 tablet (145 mg total) by mouth daily. Please make an appt with the Doctor before anymore refills. 1st attempt 03/29/17  Yes Larey Dresser, MD  fexofenadine (ALLEGRA) 180 MG tablet Take 1 tablet (180 mg total) by mouth daily. 12/03/14  Yes Jeffery, Chelle, PA-C  HUMIRA PEN 40 MG/0.8ML PNKT Inject 40 mg into the skin every 14 (fourteen) days. 02/03/17  Yes [provider]  levocetirizine (XYZAL) 5 MG tablet Take 5 mg by mouth every evening.   Yes [provider]  metFORMIN (GLUCOPHAGE) 500 MG tablet Take 1 tablet (500 mg total) by mouth 2 (two) times daily with a meal. 12/03/14  Yes  Jeffery, Chelle, PA-C  montelukast (SINGULAIR) 10 MG tablet Take 10 mg by mouth at bedtime.   Yes [provider]  NEXIUM 40 MG capsule TAKE 1 CAPSULE BY MOUTH ONCE DAILY 05/05/15  Yes Nandigam, Kavitha V, MD  rosuvastatin (CRESTOR) 20 MG tablet Take 1 tablet (20 mg total) by mouth daily. 07/13/16 04/16/17 Yes Weaver, Scott T, PA-C  vitamin E 1000 UNIT capsule Take 1,000 Units daily by mouth.   Yes [provider]  cyclobenzaprine (FLEXERIL) 10 MG tablet Take 0.5-1 tablets (5-10 mg total) by mouth 3 (three) times daily as needed for muscle spasms. 01/11/13   Harrison Mons, PA-C  diphenoxylate-atropine (LOMOTIL) 2.5-0.025 MG tablet Take 1 tablet by mouth 3 (three) times daily. Patient taking differently: Take 1 tablet 3 (three) times daily as needed by mouth for diarrhea or loose stools.  02/22/17   Mauri Pole, MD  EPIPEN 2-PAK 0.3 MG/0.3ML SOAJ injection Reported on 06/27/2015 10/09/14   [provider]  LORazepam (ATIVAN) 0.5 MG tablet Take 2 tablets (1 mg total) by mouth every 6 (six) hours as needed (Dizziness). Patient not taking: Reported on 04/16/2017 12/19/16   Daleen Bo, MD  zolpidem (AMBIEN) 10 MG tablet Take 10 mg by mouth at bedtime as needed for sleep.     [provider]    Inpatient Medications: Scheduled Meds: . aspirin  324 mg Oral Once  . heparin  4,000 Units Intravenous Once   Continuous Infusions: . heparin     PRN Meds: nitroGLYCERIN  Allergies:    Allergies  Allergen Reactions  . Lipitor [Atorvastatin] Other (See Comments)    Elevated ALT    Social History:   Social History   Socioeconomic History  . Marital status: Married    Spouse name: Dane  . Number of children: 2  . Years of education: 12+  . Highest education level: Not on file  Social Needs  . Financial resource strain: Not on file  . Food insecurity - worry: Not on file  . Food insecurity - inability: Not on file  . Transportation needs - medical:  Not on file  . Transportation needs - non-medical: Not on file  Occupational History  . Occupation: MACHINE OPERATOR    Employer: [employer]  . Occupation: ACCOUNT REP.    Employer: [employer]  Tobacco Use  . Smoking status: Former Smoker    Packs/day: 1.00    Years: 35.00    Pack years: 35.00    Types: Cigarettes    Last attempt to quit: 05/31/2004    Years since quitting: 12.8  . Smokeless tobacco: Never Used  Substance and Sexual Activity  . Alcohol use: No  . Drug use: No  . Sexual activity: No    Comment: married  Other Topics Concern  . Not on file  Social History Narrative   Lives with her husband and their pets. Her children are adults and live independently.    Family History:    Family History  Problem Relation Age of Onset  . Lung cancer Maternal Grandmother   . Stroke Father   . Hypertension Mother   . Heart attack Mother   . Heart disease Mother   . Hypertension Brother   . Heart disease Brother   . Hypertension Sister   . Heart disease Sister   . Coronary artery disease Brother   . Colon cancer Neg Hx      ROS:  Please see the history of present illness.  ROS  All other ROS reviewed and negative.     Physical Exam/Data:   Vitals:   04/16/17 1323 04/16/17 1328 04/16/17 1445  BP:   130/66  Resp:   (!) 24  SpO2: 97%    Weight:  143 lb (64.9 kg)   Height:  5' 3"  (1.6 m)    No intake or output data in the 24 hours ending 04/16/17 1508 Filed Weights   04/16/17 1328  Weight: 143 lb (64.9 kg)   Body mass index is 25.33 kg/m.  General:  Well nourished, well developed, in no acute distress HEENT: normal Lymph: no adenopathy Neck: no JVD Endocrine:  No thryomegaly Vascular: No carotid bruits; FA pulses 2+ bilaterally without bruits  Cardiac:  normal S1, S2; RRR; no murmur  Lungs:  clear to auscultation bilaterally, no wheezing, rhonchi or rales  Abd: soft, nontender, no hepatomegaly  Ext: no  edema Musculoskeletal:  No deformities, BUE and BLE strength normal and equal, pain in her chest when she sat up for a assessment of lungs from the back. Skin: warm and dry  Neuro:  CNs 2-12 intact, no focal abnormalities noted Psych:  Normal affect   EKG:  The EKG was personally reviewed and demonstrates: Sinus rhythm, T wave inversion inferior laterally. Telemetry:  Telemetry was personally reviewed and demonstrates: Normal sinus rhythm.  Relevant CV Studies: Echocardiogram 03/29/2016 Study Highlights    Nuclear stress EF: 55%.  There was no ST segment deviation noted during stress.  The study is normal.  This is a low risk study.  The left ventricular ejection fraction is normal (55-65%).   Coronary CTA 11/12 Left Main: No plaque or stenosis Left Anterior Descending: Mild nonobstructive mixed plaque in the mid LAD. Left Circumflex: Moderate sized ramus with no plaque or stenosis. The CFX itself with a small vessel with no plaque or stenosis. Right Coronary Artery: Dominant vessel, no plaque or stenosis. Coronary Calcium Score: 7 Agatston units IMPRESSION: 1. No obstructive coronary disease. There was mild nonobstructive plaque in the mid LAD. 2. Coronary calcium score of 4 Agatston units places the patient in the [place] percentile for her age and gender. This is a high risk category for future cardiac events. I would suggest ASA 81 mg daily and aggressive lowering of LDL cholesterol to ideally <70 but at least < 100.  Myoview 03/29/16  Nuclear stress EF: 55%.  There was no ST segment deviation noted during stress.  The study is normal.  This is a low risk study.  The left ventricular ejection fraction is normal (55-65%).  Laboratory Data:  Chemistry Recent Labs  Lab 04/16/17 1330  NA 137  K 3.7  CL 106  CO2 24  GLUCOSE 186*  BUN 13  CREATININE 0.60  CALCIUM 9.1  GFRNONAA >60  GFRAA >60  ANIONGAP 7  Recent Labs  Lab 04/16/17 1330  PROT 6.0*   ALBUMIN 3.8  AST 35  ALT 29  ALKPHOS 72  BILITOT 0.5   Hematology Recent Labs  Lab 04/16/17 1330  WBC 10.0  RBC 4.39  HGB 13.5  HCT 40.3  MCV 91.8  MCH 30.8  MCHC 33.5  RDW 12.6  PLT 271   Cardiac EnzymesNo results for input(s): TROPONINI in the last 168 hours.  Recent Labs  Lab 04/16/17 1348  TROPIPOC 0.16*    BNPNo results for input(s): BNP, PROBNP in the last 168 hours.  DDimer  Recent Labs  Lab 04/16/17 1330  DDIMER <0.27    Radiology/Studies:  Dg Chest Portable 1 View  Result Date: 04/16/2017 CLINICAL DATA:  Chest pain and shortness of breath EXAM: PORTABLE CHEST 1 VIEW COMPARISON:  05/17/2014 FINDINGS: The heart size and mediastinal contours are within normal limits. Both lungs are clear. Minimal scarring is noted in the left base stable from the prior study. The visualized skeletal structures are unremarkable. IMPRESSION: No active disease. Electronically Signed   By: Inez Catalina M.D.   On: 04/16/2017 14:03     Assessment and Plan:   1.  Chest pain: Focal atypical features.  States that she has chronic discomfort, but pain has been more prominent since last Thursday after having had a very stressful situation at work.  Pain is become intermittent radiating through to her back.  Troponin is elevated 2.16.  EKG reveals nonspecific changes.  Review of CT angiography revealed nonobstructive CAD with mild nonobstructive disease in the LAD calcium score of 4. Will likely admit patient to rule out ACS, continue to cycle troponin, continue her on statin, aspirin, and consider repeating a stress test versus catheterization.  2.  Dyspnea: Patient states this is been worsening over the last year, with associated weakness in her legs.  He states walking causes her to be short of breath bending over causes her to be short of breath and climbing stairs has really become an effort for her.  Does not have a history of lung disease but she does have a remote history of  tobacco abuse quitting in 2006.  Consideration for PFTs may be helpful as an outpatient.   3.  History of hyperlipidemia: Continue statin, fenofibrate, continue with primary care for ongoing management  4. Crohn's Disease: Chronic. Followed by GI,.  No evidence of anemia.  She denies any melena or significant abdominal pain.  For questions or updates, please contact [place] Please consult [email or web address] for contact info under Cardiology/STEMI.   Signed, Jory Sims, NP  04/16/2017 3:08 PM    The patient was seen, examined and discussed with Jory Sims, NP and I agree with the above.   56 year old female with HTN, HLP, DM2, known non-obstructive CAD on Coronary CTA in 2012, significant FH od early CAD, followed by DR Aundra Dubin, negative stress test in 02/2016 who presented with a typical retrosternal chest pain that started on Thursday 11/15 and on and off since then. ECG shows new negative T waves in the lateral T waves.  Troponin is elevated at 0.16. We will start Heparin iv for NSTEMI, plan for a left cardiac cath on Monday. Continue ASA, rosuvastatin, fenofibrate, enalapril, start toprol XL 25 mg po daily.  Ena Dawley, MD 04/16/2017

## 2017-04-16 NOTE — ED Notes (Signed)
Dinner tray ordered; heart healthy diet

## 2017-04-16 NOTE — Progress Notes (Signed)
ANTICOAGULATION CONSULT NOTE - Initial Consult  Pharmacy Consult for heparin Indication: chest pain/ACS  Allergies  Allergen Reactions  . Lipitor [Atorvastatin] Other (See Comments)    Elevated ALT    Patient Measurements: Height: 5' 3"  (160 cm) Weight: 143 lb (64.9 kg) IBW/kg (Calculated) : 52.4 Heparin Dosing Weight: 64.9kg  Vital Signs: Temp: 98.3 F (36.8 C) (11/17 1158) Temp Source: Oral (11/17 1158) BP: 130/66 (11/17 1445) Pulse Rate: 91 (11/17 1158)  Labs: Recent Labs    04/16/17 1330  HGB 13.5  HCT 40.3  PLT 271  CREATININE 0.60    Estimated Creatinine Clearance: 69.5 mL/min (by C-G formula based on SCr of 0.6 mg/dL).   Medical History: Past Medical History:  Diagnosis Date  . Allergic rhinitis due to pollen   . Anxiety state, unspecified   . Arthritis   . Bronchitis    hx  . Calculus of gallbladder without mention of cholecystitis or obstruction   . Crohn disease ([place])   . Depressive disorder, not elsewhere classified   . Diabetes mellitus without complication (HCC)    diet controlled  . Esophageal reflux   . Fibromyalgia   . Headache   . History of nuclear stress test    ETT-Myoview 10/17: EF 55%, normal perfusion; Low Risk  . Myalgia and myositis, unspecified   . Other and unspecified hyperlipidemia   . Palpitations   . Pneumonia 15   hx  . Unspecified essential hypertension   . Vitamin B deficiency     Medications:  Infusions:  . heparin      Assessment: 65 yof presented to the ED with CP. Troponin elevated and now starting IV heparin. Baseline CBC is WNL. She is not on anticoagulation PTA.   Goal of Therapy:  Heparin level 0.3-0.7 units/ml Monitor platelets by anticoagulation protocol: Yes   Plan:  Heparin bolus 4000 units IV x 1 Heparin gtt 800 units/hr Check a 6 hr heparin level Daily heparin level and CBC  Teshia Mahone, Rande Lawman 04/16/2017,3:27 PM

## 2017-04-16 NOTE — ED Notes (Signed)
Message pharmacy for medication verification

## 2017-04-16 NOTE — ED Notes (Signed)
Roselyn Reef RN attempted report to floor RN before leaving.  Floor unable to take report at this time.

## 2017-04-16 NOTE — Progress Notes (Signed)
Chief Complaint  Patient presents with  . Chest Pain    started 3 days ago, diabetic with fam hx of heart disease    HPI Pt reports that she started having chest pain started 3 days ago  Reports that she was stressed and felt pain in her chest, radiating to her shoulder, neck and back She states that she typically takes aspirin  She has a cardiologist who gave her nitro but she did not take any She reports that the pain is about 6/10 She reports that when she walks she feels like the pain is worse in her back  She states that this morning she ate a breakfast burrito from [employer] but that did not worsen her pain  She is s/p lap chole  She has a history of crohns and takes meds for that but denies blood per rectum She did not have any lightheadedness She did not feel like she was having any heart burn She has a history of diabetes that is controlled. She quit smoking years ago. Lab Results  Component Value Date   HGBA1C 7.1 (H) 12/18/2015   She reports th at she has an extensive family history of heart disease and strokes Family History  Problem Relation Age of Onset  . Lung cancer Maternal Grandmother   . Stroke Father   . Hypertension Mother   . Heart attack Mother   . Heart disease Mother   . Hypertension Brother   . Heart disease Brother   . Hypertension Sister   . Heart disease Sister   . Coronary artery disease Brother   . Colon cancer Neg Hx     The 10-year ASCVD risk score Mikey Bussing DC Jr., et al., 2013) is: 10.9%   Values used to calculate the score:     Age: 56 years     Sex: Female     Is Non-Hispanic African American: No     Diabetic: Yes     Tobacco smoker: No     Systolic Blood Pressure: 161 mmHg     Is BP treated: Yes     HDL Cholesterol: 34.1 mg/dL     Total Cholesterol: 247 mg/dL   Past Medical History:  Diagnosis Date  . Allergic rhinitis due to pollen   . Anxiety state, unspecified   . Arthritis   . Bronchitis    hx  . Calculus of  gallbladder without mention of cholecystitis or obstruction   . Crohn disease ([place])   . Depressive disorder, not elsewhere classified   . Diabetes mellitus without complication (HCC)    diet controlled  . Esophageal reflux   . Fibromyalgia   . Headache   . History of nuclear stress test    ETT-Myoview 10/17: EF 55%, normal perfusion; Low Risk  . Myalgia and myositis, unspecified   . Other and unspecified hyperlipidemia   . Palpitations   . Pneumonia 15   hx  . Unspecified essential hypertension   . Vitamin B deficiency     Current Outpatient Medications  Medication Sig Dispense Refill  . aspirin 81 MG chewable tablet Chew 81 mg by mouth daily.      . cyclobenzaprine (FLEXERIL) 10 MG tablet Take 0.5-1 tablets (5-10 mg total) by mouth 3 (three) times daily as needed for muscle spasms. 30 tablet 0  . diphenoxylate-atropine (LOMOTIL) 2.5-0.025 MG tablet Take 1 tablet by mouth 3 (three) times daily. 90 tablet 1  . enalapril (VASOTEC) 5 MG tablet Take 1 tablet (5 mg total)  by mouth daily. 30 tablet 11  . EPIPEN 2-PAK 0.3 MG/0.3ML SOAJ injection Reported on 06/27/2015  0  . fenofibrate (TRICOR) 145 MG tablet Take 1 tablet (145 mg total) by mouth daily. Please make an appt with the Doctor before anymore refills. 1st attempt 30 tablet 0  . fexofenadine (ALLEGRA) 180 MG tablet Take 1 tablet (180 mg total) by mouth daily. 90 tablet 3  . HUMIRA PEN 40 MG/0.8ML PNKT Inject 40 mg into the skin every 14 (fourteen) days.  10  . levocetirizine (XYZAL) 5 MG tablet Take 5 mg by mouth every evening.    Marland Kitchen LORazepam (ATIVAN) 0.5 MG tablet Take 2 tablets (1 mg total) by mouth every 6 (six) hours as needed (Dizziness). 20 tablet 0  . metFORMIN (GLUCOPHAGE) 500 MG tablet Take 1 tablet (500 mg total) by mouth 2 (two) times daily with a meal. 180 tablet 3  . montelukast (SINGULAIR) 10 MG tablet Take 10 mg by mouth at bedtime.    Marland Kitchen NEXIUM 40 MG capsule TAKE 1 CAPSULE BY MOUTH ONCE DAILY 30 capsule 1  .  rosuvastatin (CRESTOR) 20 MG tablet Take 1 tablet (20 mg total) by mouth daily. 90 tablet 3  . zolpidem (AMBIEN) 10 MG tablet Take 10 mg by mouth at bedtime as needed for sleep.      Current Facility-Administered Medications  Medication Dose Route Frequency Provider Last Rate Last Dose  . aspirin chewable tablet 324 mg  324 mg Oral Once Stallings, Zoe A, MD      . nitroGLYCERIN (NITROSTAT) SL tablet 0.3 mg  0.3 mg Sublingual Q5 min PRN [name], MD        Allergies:  Allergies  Allergen Reactions  . Lipitor [Atorvastatin] Other (See Comments)    Elevated ALT    Past Surgical History:  Procedure Laterality Date  . ESOPHAGOGASTRODUODENOSCOPY    . EXPLORATORY LAPAROTOMY     for endometriosis  . LAPAROSCOPIC CHOLECYSTECTOMY WITH INTRAOPERATIVE CHOLANGIOGRAM N/A 09/19/2014   Performed by Donnie Mesa, MD at [facility] OR  . TONSILLECTOMY AND ADENOIDECTOMY    . TUBAL LIGATION     BILATERAL    Social History   Socioeconomic History  . Marital status: Married    Spouse name: Dane  . Number of children: 2  . Years of education: 12+  . Highest education level: None  Social Needs  . Financial resource strain: None  . Food insecurity - worry: None  . Food insecurity - inability: None  . Transportation needs - medical: None  . Transportation needs - non-medical: None  Occupational History  . Occupation: MACHINE OPERATOR    Employer: [employer]  . Occupation: ACCOUNT REP.    Employer: [employer]  Tobacco Use  . Smoking status: Former Smoker    Packs/day: 1.00    Years: 35.00    Pack years: 35.00    Types: Cigarettes    Last attempt to quit: 05/31/2004    Years since quitting: 12.8  . Smokeless tobacco: Never Used  Substance and Sexual Activity  . Alcohol use: No  . Drug use: No  . Sexual activity: No    Comment: married  Other Topics Concern  . None  Social History Narrative   Lives with her husband and their pets. Her children are  adults and live independently.    Family History  Problem Relation Age of Onset  . Lung cancer Maternal Grandmother   . Stroke Father   . Hypertension Mother   .  Heart attack Mother   . Heart disease Mother   . Hypertension Brother   . Heart disease Brother   . Hypertension Sister   . Heart disease Sister   . Coronary artery disease Brother   . Colon cancer Neg Hx      Review of Systems  Constitutional: Negative for chills, diaphoresis, fever, malaise/fatigue and weight loss.  HENT: Negative for congestion, ear discharge, ear pain, hearing loss and nosebleeds.   Respiratory: Positive for shortness of breath. Negative for cough, hemoptysis, sputum production and wheezing.   Cardiovascular: Positive for chest pain and palpitations. Negative for orthopnea, claudication, leg swelling and PND.  Gastrointestinal: Negative for abdominal pain, blood in stool, constipation, diarrhea, nausea and vomiting.  Genitourinary: Negative for dysuria and urgency.  Musculoskeletal: Negative for myalgias and neck pain.  Skin: Negative for itching and rash.  Neurological: Negative for dizziness, tingling, tremors and headaches.  Psychiatric/Behavioral: Negative for depression. The patient is not nervous/anxious.       Objective: Vitals:   04/16/17 1158  BP: 123/79  Pulse: 91  Resp: 17  Temp: 98.3 F (36.8 C)  TempSrc: Oral  SpO2: 98%  Weight: 143 lb 3.2 oz (65 kg)  Height: 5' 3"  (1.6 m)    Physical Exam  Constitutional: She is oriented to person, place, and time. She appears well-developed and well-nourished.  HENT:  Head: Normocephalic and atraumatic.  Eyes: EOM are normal. Pupils are equal, round, and reactive to light.  Neck: Normal range of motion. No thyromegaly present.  Cardiovascular: Normal rate, regular rhythm, intact distal pulses and normal pulses.  No murmur heard. Pulmonary/Chest: Effort normal and breath sounds normal. No accessory muscle usage. No tachypnea.    Musculoskeletal: Normal range of motion.       Right lower leg: Normal.       Left lower leg: Normal.  Neurological: She is alert and oriented to person, place, and time.  Skin: Skin is warm. Capillary refill takes less than 2 seconds. No rash noted. No erythema.  Psychiatric: She has a normal mood and affect.     She has an ecg from [facility] with t wave changes in 03/22/2016 Her ecg today shows possible a. Fib with HR 133 AND REPEATED IN 3 MINUTES SHOWED sinus rhythm with hr 88   Assessment and Plan [name] was seen today for chest pain.  Diagnoses and all orders for this visit:  SOB (shortness of breath) -     EKG 12-Lead -     aspirin chewable tablet 324 mg -     nitroGLYCERIN (NITROSTAT) SL tablet 0.3 mg  Chest pain on breathing -     aspirin chewable tablet 324 mg -     nitroGLYCERIN (NITROSTAT) SL tablet 0.3 mg  Type 2 diabetes mellitus without complication, without long-term current use of insulin ([place])  Family history of heart disease  Transferred patient to the ER by paramedic for her chest pain that radiates to the back, ecg changes, personal history of diabetes, hypertension and family history of heart disease Although I do not think she is having ACS I would like this to be ruled out and well as PE I would also like the patient to get stat labs She has a cardiologist that she is well established with She was directed to the ER and paramedics took the patient by ambulance after she what given aspirin 346m and nitro 0.451m  Zoe A Stallings

## 2017-04-17 ENCOUNTER — Inpatient Hospital Stay (HOSPITAL_COMMUNITY): Payer: BLUE CROSS/BLUE SHIELD

## 2017-04-17 DIAGNOSIS — I214 Non-ST elevation (NSTEMI) myocardial infarction: Secondary | ICD-10-CM | POA: Diagnosis present

## 2017-04-17 DIAGNOSIS — R0602 Shortness of breath: Secondary | ICD-10-CM | POA: Diagnosis present

## 2017-04-17 DIAGNOSIS — Z8249 Family history of ischemic heart disease and other diseases of the circulatory system: Secondary | ICD-10-CM

## 2017-04-17 LAB — CBC
HCT: 40.5 % (ref 36.0–46.0)
Hemoglobin: 13.5 g/dL (ref 12.0–15.0)
MCH: 31 pg (ref 26.0–34.0)
MCHC: 33.3 g/dL (ref 30.0–36.0)
MCV: 92.9 fL (ref 78.0–100.0)
PLATELETS: 259 10*3/uL (ref 150–400)
RBC: 4.36 MIL/uL (ref 3.87–5.11)
RDW: 12.8 % (ref 11.5–15.5)
WBC: 9.3 10*3/uL (ref 4.0–10.5)

## 2017-04-17 LAB — BASIC METABOLIC PANEL
ANION GAP: 6 (ref 5–15)
BUN: 17 mg/dL (ref 6–20)
CHLORIDE: 107 mmol/L (ref 101–111)
CO2: 26 mmol/L (ref 22–32)
Calcium: 9.4 mg/dL (ref 8.9–10.3)
Creatinine, Ser: 0.63 mg/dL (ref 0.44–1.00)
GFR calc non Af Amer: >60 mL/min (ref >60)
Glucose, Bld: 182 mg/dL — ABNORMAL HIGH (ref 65–99)
POTASSIUM: 4.2 mmol/L (ref 3.5–5.1)
SODIUM: 139 mmol/L (ref 135–145)

## 2017-04-17 LAB — GLUCOSE, CAPILLARY
Glucose-Capillary: 200 mg/dL — ABNORMAL HIGH (ref 65–99)
Glucose-Capillary: 235 mg/dL — ABNORMAL HIGH (ref 65–99)
Glucose-Capillary: 235 mg/dL — ABNORMAL HIGH (ref 65–99)
Glucose-Capillary: 204 mg/dL — ABNORMAL HIGH (ref 65–99)

## 2017-04-17 LAB — HEPARIN LEVEL (UNFRACTIONATED)
Heparin Unfractionated: 0.29 IU/mL — ABNORMAL LOW (ref 0.30–0.70)
Heparin Unfractionated: 0.13 IU/mL — ABNORMAL LOW (ref 0.30–0.70)
Heparin Unfractionated: 0.54 IU/mL (ref 0.30–0.70)

## 2017-04-17 LAB — HIV ANTIBODY (ROUTINE TESTING W REFLEX): HIV Screen 4th Generation wRfx: NONREACTIVE

## 2017-04-17 MED ORDER — SODIUM CHLORIDE 0.9 % WEIGHT BASED INFUSION
3.0000 mL/kg/h | INTRAVENOUS | Status: DC
  Administered 2017-04-18: 3 mL/kg/h via INTRAVENOUS

## 2017-04-17 MED ORDER — SODIUM CHLORIDE 0.9 % WEIGHT BASED INFUSION: 3.0000 mL/kg/h | INTRAVENOUS | Status: DC

## 2017-04-17 MED ORDER — SODIUM CHLORIDE 0.9% FLUSH: 3.0000 mL | INTRAVENOUS | Status: DC | PRN

## 2017-04-17 MED ORDER — ASPIRIN 81 MG PO CHEW: 81.0000 mg | CHEWABLE_TABLET | ORAL | Status: DC

## 2017-04-17 MED ORDER — ASPIRIN 81 MG PO CHEW: 81.0000 mg | CHEWABLE_TABLET | Freq: Every day | ORAL | Status: DC

## 2017-04-17 MED ORDER — SODIUM CHLORIDE 0.9% FLUSH
3.0000 mL | Freq: Two times a day (BID) | INTRAVENOUS | Status: DC
  Administered 2017-04-17: 3 mL via INTRAVENOUS

## 2017-04-17 MED ORDER — SODIUM CHLORIDE 0.9 % IV SOLN: 250.0000 mL | INTRAVENOUS | Status: DC | PRN

## 2017-04-17 MED ORDER — SODIUM CHLORIDE 0.9 % IV SOLN
250.0000 mL | INTRAVENOUS | Status: DC | PRN
Start: 2017-04-17 — End: 2017-04-17

## 2017-04-17 MED ORDER — ASPIRIN 81 MG PO CHEW
81.0000 mg | CHEWABLE_TABLET | ORAL | Status: AC
  Administered 2017-04-18: 81 mg via ORAL
  Filled 2017-04-17: qty 1

## 2017-04-17 MED ORDER — SODIUM CHLORIDE 0.9% FLUSH: 3.0000 mL | Freq: Two times a day (BID) | INTRAVENOUS | Status: DC

## 2017-04-17 MED ORDER — SODIUM CHLORIDE 0.9 % WEIGHT BASED INFUSION: 1.0000 mL/kg/h | INTRAVENOUS | Status: DC

## 2017-04-17 MED ORDER — NITROGLYCERIN IN D5W 200-5 MCG/ML-% IV SOLN
3.0000 ug/min | INTRAVENOUS | Status: DC
  Administered 2017-04-17: 3 ug/min via INTRAVENOUS
  Filled 2017-04-17: qty 250

## 2017-04-17 MED ORDER — ASPIRIN 81 MG PO CHEW
81.0000 mg | CHEWABLE_TABLET | Freq: Every day | ORAL | Status: DC
  Filled 2017-04-17 (×2): qty 1

## 2017-04-17 NOTE — H&P (View-Only) (Signed)
Progress Note  Patient Name: [name] Date of Encounter: 04/17/2017  Primary Cardiologist: Dr Aundra Dubin  Subjective   She denies any chest pain overnight.  Inpatient Medications    Scheduled Meds: . aspirin  81 mg Oral Daily  . enalapril  5 mg Oral Daily  . fenofibrate  160 mg Oral Daily  . loratadine  10 mg Oral QPM  . metFORMIN  500 mg Oral BID WC  . metoprolol succinate  25 mg Oral Daily  . montelukast  10 mg Oral QHS  . pantoprazole  40 mg Oral Daily  . rosuvastatin  20 mg Oral Daily  . vitamin C  1,000 mg Oral Daily  . vitamin E  1,000 Units Oral Daily   Continuous Infusions: . heparin 1,100 Units/hr (04/17/17 0510)   PRN Meds: cyclobenzaprine, diphenoxylate-atropine, LORazepam, nitroGLYCERIN, zolpidem   Vital Signs    Vitals:   04/16/17 1944 04/16/17 2027 04/17/17 0032 04/17/17 0519  BP: 110/76 123/76 (!) 119/54 115/67  Pulse: 82 92 70 72  Resp:  16 16 16   Temp: 98 F (36.7 C) 98.3 F (36.8 C) 98.4 F (36.9 C) 98.1 F (36.7 C)  TempSrc: Oral Oral Oral Oral  SpO2: 98% 96% 99% 98%  Weight:  141 lb 12.8 oz (64.3 kg)  141 lb (64 kg)  Height:  5' 3"  (1.6 m)      Intake/Output Summary (Last 24 hours) at 04/17/2017 0913 Last data filed at 04/17/2017 0600 Gross per 24 hour  Intake 219.86 ml  Output 1051 ml  Net -831.14 ml   Filed Weights   04/16/17 1328 04/16/17 2027 04/17/17 0519  Weight: 143 lb (64.9 kg) 141 lb 12.8 oz (64.3 kg) 141 lb (64 kg)    Telemetry    SR - Personally Reviewed  ECG    Sinus rhythm, T wave inversion inferior laterally - Personally Reviewed  Physical Exam   GEN: No acute distress.   Neck: No JVD Cardiac: RRR, no murmurs, rubs, or gallops.  Respiratory: Clear to auscultation bilaterally. GI: Soft, nontender, non-distended  MS: No edema; No deformity. Neuro:  Nonfocal  Psych: Normal affect   Labs    Chemistry Recent Labs  Lab 04/16/17 1330 04/17/17 0424  NA 137 139  K 3.7 4.2  CL 106 107  CO2 24 26    GLUCOSE 186* 182*  BUN 13 17  CREATININE 0.60 0.63  CALCIUM 9.1 9.4  PROT 6.0*  --   ALBUMIN 3.8  --   AST 35  --   ALT 29  --   ALKPHOS 72  --   BILITOT 0.5  --   GFRNONAA >60 >60  GFRAA >60 >60  ANIONGAP 7 6    Hematology Recent Labs  Lab 04/16/17 1330 04/17/17 0424  WBC 10.0 9.3  RBC 4.39 4.36  HGB 13.5 13.5  HCT 40.3 40.5  MCV 91.8 92.9  MCH 30.8 31.0  MCHC 33.5 33.3  RDW 12.6 12.8  PLT 271 259   Cardiac EnzymesNo results for input(s): TROPONINI in the last 168 hours.  Recent Labs  Lab 04/16/17 1348 04/16/17 1758  TROPIPOC 0.16* 0.15*    BNPNo results for input(s): BNP, PROBNP in the last 168 hours.   DDimer  Recent Labs  Lab 04/16/17 1330  DDIMER <0.27    Radiology    Dg Chest Portable 1 View  Result Date: 04/16/2017 CLINICAL DATA:  Chest pain and shortness of breath EXAM: PORTABLE CHEST 1 VIEW COMPARISON:  05/17/2014 FINDINGS: The  heart size and mediastinal contours are within normal limits. Both lungs are clear. Minimal scarring is noted in the left base stable from the prior study. The visualized skeletal structures are unremarkable. IMPRESSION: No active disease. Electronically Signed   By: Inez Catalina M.D.   On: 04/16/2017 14:03   Cardiac Studies   Echo is pending  Patient Profile     56 y.o. female   Assessment & Plan    NSTEMI Chest pain Hypertension Hyperlipidemia Family history of early CAD  56 year old female with HTN, HLP, DM2, known non-obstructive CAD on Coronary CTA in 2012, significant FH od early CAD, followed by DR Aundra Dubin, negative stress test in 02/2016 who presented with a typical retrosternal chest pain that started on Thursday 11/15 and on and off since then. ECG shows new negative T waves in the lateral T waves.  Troponin is elevated at 0.16-->0.15. We will  continue Heparin iv for NSTEMI till cath tomorrow. Continue ASA, rosuvastatin, fenofibrate, enalapril, start toprol XL 25 mg po daily. Hold metformin prior to  cath. Echo is pending.  For questions or updates, please contact [place] Please consult [email or web address] for contact info under Cardiology/STEMI.      Signed, Ena Dawley, MD  04/17/2017, 9:13 AM

## 2017-04-17 NOTE — Progress Notes (Signed)
[place] for heparin Indication: chest pain/ACS  Allergies  Allergen Reactions  . Lipitor [Atorvastatin] Other (See Comments)    Elevated ALT    Patient Measurements: Height: 5' 3"  (160 cm) Weight: 141 lb (64 kg) IBW/kg (Calculated) : 52.4 Heparin Dosing Weight: 64.9kg  Vital Signs: Temp: 98.3 F (36.8 C) (11/18 1214) Temp Source: Oral (11/18 1214) BP: 103/56 (11/18 1214) Pulse Rate: 73 (11/18 1214)  Labs: Recent Labs    04/16/17 1330 04/16/17 2200 04/17/17 0424 04/17/17 1122  HGB 13.5  --  13.5  --   HCT 40.3  --  40.5  --   PLT 271  --  259  --   HEPARINUNFRC  --  0.17* 0.29* 0.13*  CREATININE 0.60  --  0.63  --     Estimated Creatinine Clearance: 69 mL/min (by C-G formula based on SCr of 0.63 mg/dL).   Medical History: Past Medical History:  Diagnosis Date  . Allergic rhinitis due to pollen   . Anxiety state, unspecified   . Arthritis   . Bronchitis    hx  . Calculus of gallbladder without mention of cholecystitis or obstruction   . Crohn disease ([place])   . Depressive disorder, not elsewhere classified   . Diabetes mellitus without complication (HCC)    diet controlled  . Esophageal reflux   . Fibromyalgia   . Headache   . History of nuclear stress test    ETT-Myoview 10/17: EF 55%, normal perfusion; Low Risk  . Myalgia and myositis, unspecified   . Other and unspecified hyperlipidemia   . Palpitations   . Pneumonia 15   hx  . Unspecified essential hypertension   . Vitamin B deficiency     Medications:  Infusions:  . heparin 1,100 Units/hr (04/17/17 0948)  . nitroGLYCERIN 3 mcg/min (04/17/17 1003)    Assessment: 29 yof presented to the ED with CP. Troponin elevated and continuing on IV heparin for ACS. Baseline CBC is WNL. She is not on anticoagulation PTA.   Heparin level decreased now after rate increase this AM. Per discussion with RN, drip was off for ~10 minutes today, but this was after lab drew  level. No issues with bleeding reported.  Goal of Therapy:  Heparin level 0.3-0.7 units/ml Monitor platelets by anticoagulation protocol: Yes   Plan:  Increase heparin gtt to 1300 units/hr Daily heparin level and CBC Monitor for s/sx bleeding Cath planned for 11/19   Elicia Lamp, PharmD, BCPS Clinical Pharmacist Rx Phone # for today: [phone] After 3:30PM, please call Main Rx: [id number] 04/17/2017 1:09 PM

## 2017-04-17 NOTE — Progress Notes (Signed)
[place] for heparin Indication: chest pain/ACS  Allergies  Allergen Reactions  . Lipitor [Atorvastatin] Other (See Comments)    Elevated ALT    Patient Measurements: Height: 5' 3"  (160 cm) Weight: 141 lb (64 kg) IBW/kg (Calculated) : 52.4 Heparin Dosing Weight: 64.9kg  Vital Signs: Temp: 98.4 F (36.9 C) (11/18 2020) Temp Source: Oral (11/18 2020) BP: 106/53 (11/18 2020) Pulse Rate: 73 (11/18 2020)  Labs: Recent Labs    04/16/17 1330  04/17/17 0424 04/17/17 1122 04/17/17 1857  HGB 13.5  --  13.5  --   --   HCT 40.3  --  40.5  --   --   PLT 271  --  259  --   --   HEPARINUNFRC  --    < > 0.29* 0.13* 0.54  CREATININE 0.60  --  0.63  --   --    < > = values in this interval not displayed.    Estimated Creatinine Clearance: 69 mL/min (by C-G formula based on SCr of 0.63 mg/dL).   Medical History: Past Medical History:  Diagnosis Date  . Allergic rhinitis due to pollen   . Anxiety state, unspecified   . Arthritis   . Bronchitis    hx  . Calculus of gallbladder without mention of cholecystitis or obstruction   . Crohn disease ([place])   . Depressive disorder, not elsewhere classified   . Diabetes mellitus without complication (HCC)    diet controlled  . Esophageal reflux   . Fibromyalgia   . Headache   . History of nuclear stress test    ETT-Myoview 10/17: EF 55%, normal perfusion; Low Risk  . Myalgia and myositis, unspecified   . Other and unspecified hyperlipidemia   . Palpitations   . Pneumonia 15   hx  . Unspecified essential hypertension   . Vitamin B deficiency     Medications:  Infusions:  . sodium chloride    . [START ON 04/18/2017] sodium chloride     Followed by  . [START ON 04/18/2017] sodium chloride    . heparin 1,300 Units/hr (04/17/17 1400)  . nitroGLYCERIN 3 mcg/min (04/17/17 1003)    Assessment: 64 yof presented to the ED with CP. Troponin elevated and continuing on IV heparin for ACS. CBC  is WNL. She is not on anticoagulation PTA.   Heparin level now therapeutic at 0.54. No bleed documented.  Goal of Therapy:  Heparin level 0.3-0.7 units/ml Monitor platelets by anticoagulation protocol: Yes   Plan:  Increase heparin gtt to 1300 units/hr Confirmatory heparin level with AM labs Daily heparin level and CBC Monitor for s/sx bleeding Cath planned for 11/19   Elicia Lamp, PharmD, BCPS Clinical Pharmacist Rx Phone # for today: [phone] After 3:30PM, please call Main Rx: [phone] 04/17/2017 8:56 PM

## 2017-04-17 NOTE — Progress Notes (Signed)
Progress Note  Patient Name: [name] Date of Encounter: 04/17/2017  Primary Cardiologist: Dr Aundra Dubin  Subjective   She denies any chest pain overnight.  Inpatient Medications    Scheduled Meds: . aspirin  81 mg Oral Daily  . enalapril  5 mg Oral Daily  . fenofibrate  160 mg Oral Daily  . loratadine  10 mg Oral QPM  . metFORMIN  500 mg Oral BID WC  . metoprolol succinate  25 mg Oral Daily  . montelukast  10 mg Oral QHS  . pantoprazole  40 mg Oral Daily  . rosuvastatin  20 mg Oral Daily  . vitamin C  1,000 mg Oral Daily  . vitamin E  1,000 Units Oral Daily   Continuous Infusions: . heparin 1,100 Units/hr (04/17/17 0510)   PRN Meds: cyclobenzaprine, diphenoxylate-atropine, LORazepam, nitroGLYCERIN, zolpidem   Vital Signs    Vitals:   04/16/17 1944 04/16/17 2027 04/17/17 0032 04/17/17 0519  BP: 110/76 123/76 (!) 119/54 115/67  Pulse: 82 92 70 72  Resp:  16 16 16   Temp: 98 F (36.7 C) 98.3 F (36.8 C) 98.4 F (36.9 C) 98.1 F (36.7 C)  TempSrc: Oral Oral Oral Oral  SpO2: 98% 96% 99% 98%  Weight:  141 lb 12.8 oz (64.3 kg)  141 lb (64 kg)  Height:  5' 3"  (1.6 m)      Intake/Output Summary (Last 24 hours) at 04/17/2017 0913 Last data filed at 04/17/2017 0600 Gross per 24 hour  Intake 219.86 ml  Output 1051 ml  Net -831.14 ml   Filed Weights   04/16/17 1328 04/16/17 2027 04/17/17 0519  Weight: 143 lb (64.9 kg) 141 lb 12.8 oz (64.3 kg) 141 lb (64 kg)    Telemetry    SR - Personally Reviewed  ECG    Sinus rhythm, T wave inversion inferior laterally - Personally Reviewed  Physical Exam   GEN: No acute distress.   Neck: No JVD Cardiac: RRR, no murmurs, rubs, or gallops.  Respiratory: Clear to auscultation bilaterally. GI: Soft, nontender, non-distended  MS: No edema; No deformity. Neuro:  Nonfocal  Psych: Normal affect   Labs    Chemistry Recent Labs  Lab 04/16/17 1330 04/17/17 0424  NA 137 139  K 3.7 4.2  CL 106 107  CO2 24 26    GLUCOSE 186* 182*  BUN 13 17  CREATININE 0.60 0.63  CALCIUM 9.1 9.4  PROT 6.0*  --   ALBUMIN 3.8  --   AST 35  --   ALT 29  --   ALKPHOS 72  --   BILITOT 0.5  --   GFRNONAA >60 >60  GFRAA >60 >60  ANIONGAP 7 6    Hematology Recent Labs  Lab 04/16/17 1330 04/17/17 0424  WBC 10.0 9.3  RBC 4.39 4.36  HGB 13.5 13.5  HCT 40.3 40.5  MCV 91.8 92.9  MCH 30.8 31.0  MCHC 33.5 33.3  RDW 12.6 12.8  PLT 271 259   Cardiac EnzymesNo results for input(s): TROPONINI in the last 168 hours.  Recent Labs  Lab 04/16/17 1348 04/16/17 1758  TROPIPOC 0.16* 0.15*    BNPNo results for input(s): BNP, PROBNP in the last 168 hours.   DDimer  Recent Labs  Lab 04/16/17 1330  DDIMER <0.27    Radiology    Dg Chest Portable 1 View  Result Date: 04/16/2017 CLINICAL DATA:  Chest pain and shortness of breath EXAM: PORTABLE CHEST 1 VIEW COMPARISON:  05/17/2014 FINDINGS: The  heart size and mediastinal contours are within normal limits. Both lungs are clear. Minimal scarring is noted in the left base stable from the prior study. The visualized skeletal structures are unremarkable. IMPRESSION: No active disease. Electronically Signed   By: Inez Catalina M.D.   On: 04/16/2017 14:03   Cardiac Studies   Echo is pending  Patient Profile     56 y.o. female   Assessment & Plan    NSTEMI Chest pain Hypertension Hyperlipidemia Family history of early CAD  56 year old female with HTN, HLP, DM2, known non-obstructive CAD on Coronary CTA in 2012, significant FH od early CAD, followed by DR Aundra Dubin, negative stress test in 02/2016 who presented with a typical retrosternal chest pain that started on Thursday 11/15 and on and off since then. ECG shows new negative T waves in the lateral T waves.  Troponin is elevated at 0.16-->0.15. We will  continue Heparin iv for NSTEMI till cath tomorrow. Continue ASA, rosuvastatin, fenofibrate, enalapril, start toprol XL 25 mg po daily. Hold metformin prior to  cath. Echo is pending.  For questions or updates, please contact [place] Please consult [email or web address] for contact info under Cardiology/STEMI.      Signed, Ena Dawley, MD  04/17/2017, 9:13 AM

## 2017-04-17 NOTE — Plan of Care (Signed)
  Progressing Activity: Ability to tolerate increased activity will improve 04/17/2017 0058 - Progressing by Ruben Im, RN Cardiac: Ability to achieve and maintain adequate cardiovascular perfusion will improve 04/17/2017 0058 - Progressing by Ruben Im, RN Health Behavior/Discharge Planning: Ability to safely manage health-related needs after discharge will improve 04/17/2017 0058 - Progressing by Ruben Im, RN

## 2017-04-17 NOTE — Progress Notes (Signed)
Pt is stable, vitals stable, heparin is continue at 13 and nitro is continue at 0.9 cc/hr, consent has been taken for cardiac cath, rest of the procedure is due, will continue to monitor the patient

## 2017-04-17 NOTE — Progress Notes (Signed)
Nitro drip started at 0.20m/hr, pt is also receiving heparin @11cc /hr, double check with pharmacy about it's compatibility and it is compatible, IV team consulted for second IV if in case and for surgery tomorrow, will continue to monitor

## 2017-04-18 ENCOUNTER — Encounter (HOSPITAL_COMMUNITY)
Admission: EM | Disposition: A | Discharge status: Home / Self Care | Payer: Self-pay | Source: Home / Self Care | Attending: Cardiology

## 2017-04-18 ENCOUNTER — Inpatient Hospital Stay (HOSPITAL_COMMUNITY): Payer: BLUE CROSS/BLUE SHIELD

## 2017-04-18 ENCOUNTER — Encounter (HOSPITAL_COMMUNITY): Payer: Self-pay | Admitting: Cardiology

## 2017-04-18 DIAGNOSIS — R0600 Dyspnea, unspecified: Secondary | ICD-10-CM

## 2017-04-18 HISTORY — PX: CORONARY STENT INTERVENTION: CATH118234

## 2017-04-18 HISTORY — PX: INTRAVASCULAR PRESSURE WIRE/FFR STUDY: CATH118243

## 2017-04-18 HISTORY — PX: LEFT HEART CATH AND CORONARY ANGIOGRAPHY: CATH118249

## 2017-04-18 LAB — GLUCOSE, CAPILLARY
Glucose-Capillary: 188 mg/dL — ABNORMAL HIGH (ref 65–99)
Glucose-Capillary: 296 mg/dL — ABNORMAL HIGH (ref 65–99)
Glucose-Capillary: 101 mg/dL — ABNORMAL HIGH (ref 65–99)
Glucose-Capillary: 179 mg/dL — ABNORMAL HIGH (ref 65–99)

## 2017-04-18 LAB — CBC
HCT: 39.4 % (ref 36.0–46.0)
Hemoglobin: 13.1 g/dL (ref 12.0–15.0)
MCH: 30.8 pg (ref 26.0–34.0)
MCHC: 33.2 g/dL (ref 30.0–36.0)
MCV: 92.7 fL (ref 78.0–100.0)
PLATELETS: 248 10*3/uL (ref 150–400)
RBC: 4.25 MIL/uL (ref 3.87–5.11)
RDW: 12.7 % (ref 11.5–15.5)
WBC: 7.7 10*3/uL (ref 4.0–10.5)

## 2017-04-18 LAB — ECHOCARDIOGRAM COMPLETE
Height: 63 in
Weight: 2236.8 oz

## 2017-04-18 LAB — PROTIME-INR
INR: 0.98
Prothrombin Time: 12.9 seconds (ref 11.4–15.2)

## 2017-04-18 LAB — POCT ACTIVATED CLOTTING TIME: Activated Clotting Time: 279 seconds

## 2017-04-18 LAB — HEPARIN LEVEL (UNFRACTIONATED): Heparin Unfractionated: 0.7 IU/mL (ref 0.30–0.70)

## 2017-04-18 SURGERY — LEFT HEART CATH AND CORONARY ANGIOGRAPHY
Anesthesia: LOCAL

## 2017-04-18 MED ORDER — HEPARIN SODIUM (PORCINE) 1000 UNIT/ML IJ SOLN
INTRAMUSCULAR | Status: AC
  Filled 2017-04-18: qty 1

## 2017-04-18 MED ORDER — VERAPAMIL HCL 2.5 MG/ML IV SOLN
INTRAVENOUS | Status: DC | PRN
  Administered 2017-04-18: 08:00:00 via INTRA_ARTERIAL

## 2017-04-18 MED ORDER — SODIUM CHLORIDE 0.9% FLUSH: 3.0000 mL | INTRAVENOUS | Status: DC | PRN

## 2017-04-18 MED ORDER — HEPARIN (PORCINE) IN NACL 2-0.9 UNIT/ML-% IJ SOLN
INTRAMUSCULAR | Status: AC
  Filled 2017-04-18: qty 1000

## 2017-04-18 MED ORDER — LIDOCAINE HCL (PF) 1 % IJ SOLN
INTRAMUSCULAR | Status: DC | PRN
  Administered 2017-04-18: 2 mL

## 2017-04-18 MED ORDER — IOPAMIDOL (ISOVUE-370) INJECTION 76%
INTRAVENOUS | Status: AC
  Filled 2017-04-18: qty 100

## 2017-04-18 MED ORDER — TICAGRELOR 90 MG PO TABS
ORAL_TABLET | ORAL | Status: AC
  Filled 2017-04-18: qty 2

## 2017-04-18 MED ORDER — ACETAMINOPHEN 325 MG PO TABS
650.0000 mg | ORAL_TABLET | Freq: Four times a day (QID) | ORAL | Status: DC | PRN
  Administered 2017-04-18: 15:00:00 650 mg via ORAL
  Filled 2017-04-18: qty 2

## 2017-04-18 MED ORDER — THE SENSUOUS HEART BOOK
Freq: Once | Status: AC
  Administered 2017-04-18: 21:00:00 1
  Filled 2017-04-18: qty 1

## 2017-04-18 MED ORDER — ADENOSINE 12 MG/4ML IV SOLN
INTRAVENOUS | Status: AC
  Filled 2017-04-18: qty 16

## 2017-04-18 MED ORDER — HEPARIN SODIUM (PORCINE) 1000 UNIT/ML IJ SOLN
INTRAMUSCULAR | Status: DC | PRN
Start: 2017-04-18 — End: 2017-04-18
  Administered 2017-04-18: 3500 [IU] via INTRAVENOUS
  Administered 2017-04-18: 3000 [IU] via INTRAVENOUS

## 2017-04-18 MED ORDER — SODIUM CHLORIDE 0.9 % WEIGHT BASED INFUSION: 1.0000 mL/kg/h | INTRAVENOUS | Status: AC

## 2017-04-18 MED ORDER — ROSUVASTATIN CALCIUM 20 MG PO TABS
20.0000 mg | ORAL_TABLET | Freq: Every day | ORAL | Status: DC
  Administered 2017-04-18: 20 mg via ORAL
  Filled 2017-04-18: qty 1

## 2017-04-18 MED ORDER — MIDAZOLAM HCL 2 MG/2ML IJ SOLN
INTRAMUSCULAR | Status: DC | PRN
  Administered 2017-04-18 (×2): 1 mg via INTRAVENOUS

## 2017-04-18 MED ORDER — ADENOSINE (DIAGNOSTIC) 140MCG/KG/MIN
INTRAVENOUS | Status: DC | PRN
  Administered 2017-04-18: 140 ug/kg/min via INTRAVENOUS

## 2017-04-18 MED ORDER — INSULIN ASPART 100 UNIT/ML ~~LOC~~ SOLN
0.0000 [IU] | Freq: Three times a day (TID) | SUBCUTANEOUS | Status: DC
  Administered 2017-04-19: 5 [IU] via SUBCUTANEOUS

## 2017-04-18 MED ORDER — FENTANYL CITRATE (PF) 100 MCG/2ML IJ SOLN
INTRAMUSCULAR | Status: DC | PRN
  Administered 2017-04-18 (×2): 25 ug via INTRAVENOUS

## 2017-04-18 MED ORDER — NITROGLYCERIN 1 MG/10 ML FOR IR/CATH LAB
INTRA_ARTERIAL | Status: DC | PRN
  Administered 2017-04-18: 200 ug via INTRACORONARY

## 2017-04-18 MED ORDER — SODIUM CHLORIDE 0.9 % IV SOLN: 250.0000 mL | INTRAVENOUS | Status: DC | PRN

## 2017-04-18 MED ORDER — HEPARIN (PORCINE) IN NACL 2-0.9 UNIT/ML-% IJ SOLN
INTRAMUSCULAR | Status: AC | PRN
  Administered 2017-04-18: 1000 mL via INTRA_ARTERIAL

## 2017-04-18 MED ORDER — VITAMIN C 500 MG PO TABS
1000.0000 mg | ORAL_TABLET | Freq: Every day | ORAL | Status: DC
  Administered 2017-04-18: 1000 mg via ORAL
  Filled 2017-04-18: qty 2

## 2017-04-18 MED ORDER — TICAGRELOR 90 MG PO TABS
ORAL_TABLET | ORAL | Status: DC | PRN
  Administered 2017-04-18: 180 mg via ORAL

## 2017-04-18 MED ORDER — IOPAMIDOL (ISOVUE-370) INJECTION 76%
INTRAVENOUS | Status: DC | PRN
  Administered 2017-04-18: 110 mL via INTRA_ARTERIAL

## 2017-04-18 MED ORDER — VITAMIN E 45 MG (100 UNIT) PO CAPS
1000.0000 [IU] | ORAL_CAPSULE | Freq: Every day | ORAL | Status: DC
  Administered 2017-04-18: 21:00:00 1000 [IU] via ORAL
  Filled 2017-04-18: qty 2

## 2017-04-18 MED ORDER — FENOFIBRATE 160 MG PO TABS
160.0000 mg | ORAL_TABLET | Freq: Every day | ORAL | Status: DC
  Administered 2017-04-18: 160 mg via ORAL
  Filled 2017-04-18: qty 1

## 2017-04-18 MED ORDER — LORATADINE 10 MG PO TABS
10.0000 mg | ORAL_TABLET | Freq: Every day | ORAL | Status: DC
  Administered 2017-04-18: 21:00:00 10 mg via ORAL
  Filled 2017-04-18: qty 1

## 2017-04-18 MED ORDER — PANTOPRAZOLE SODIUM 40 MG PO TBEC
40.0000 mg | DELAYED_RELEASE_TABLET | Freq: Every day | ORAL | Status: DC
  Administered 2017-04-18: 21:00:00 40 mg via ORAL
  Filled 2017-04-18: qty 1

## 2017-04-18 MED ORDER — TICAGRELOR 90 MG PO TABS
90.0000 mg | ORAL_TABLET | Freq: Two times a day (BID) | ORAL | Status: DC
  Administered 2017-04-18 – 2017-04-19 (×2): 90 mg via ORAL
  Filled 2017-04-18 (×2): qty 1

## 2017-04-18 MED ORDER — ENALAPRIL MALEATE 5 MG PO TABS
5.0000 mg | ORAL_TABLET | Freq: Every day | ORAL | Status: DC
  Administered 2017-04-18: 5 mg via ORAL
  Filled 2017-04-18: qty 1

## 2017-04-18 MED ORDER — LIDOCAINE HCL (PF) 1 % IJ SOLN
INTRAMUSCULAR | Status: AC
  Filled 2017-04-18: qty 30

## 2017-04-18 MED ORDER — HEPARIN SODIUM (PORCINE) 5000 UNIT/ML IJ SOLN
5000.0000 [IU] | Freq: Three times a day (TID) | INTRAMUSCULAR | Status: DC
  Administered 2017-04-18 – 2017-04-19 (×2): 5000 [IU] via SUBCUTANEOUS
  Filled 2017-04-18 (×2): qty 1

## 2017-04-18 MED ORDER — SODIUM CHLORIDE 0.9% FLUSH: 3.0000 mL | Freq: Two times a day (BID) | INTRAVENOUS | Status: DC

## 2017-04-18 MED ORDER — ANGIOPLASTY BOOK
Freq: Once | Status: AC
  Administered 2017-04-18: 21:00:00 1
  Filled 2017-04-18: qty 1

## 2017-04-18 MED ORDER — HEART ATTACK BOUNCING BOOK
Freq: Once | Status: AC
  Administered 2017-04-18: 21:00:00 1
  Filled 2017-04-18: qty 1

## 2017-04-18 MED ORDER — IOPAMIDOL (ISOVUE-370) INJECTION 76%
INTRAVENOUS | Status: AC
  Filled 2017-04-18: qty 50

## 2017-04-18 MED ORDER — VERAPAMIL HCL 2.5 MG/ML IV SOLN
INTRAVENOUS | Status: AC
  Filled 2017-04-18: qty 2

## 2017-04-18 MED ORDER — MIDAZOLAM HCL 2 MG/2ML IJ SOLN
INTRAMUSCULAR | Status: AC
  Filled 2017-04-18: qty 2

## 2017-04-18 MED ORDER — METOPROLOL SUCCINATE ER 25 MG PO TB24
25.0000 mg | ORAL_TABLET | Freq: Every day | ORAL | Status: DC
  Administered 2017-04-18: 21:00:00 25 mg via ORAL
  Filled 2017-04-18: qty 1

## 2017-04-18 MED ORDER — FENTANYL CITRATE (PF) 100 MCG/2ML IJ SOLN
INTRAMUSCULAR | Status: AC
  Filled 2017-04-18: qty 2

## 2017-04-18 SURGICAL SUPPLY — 22 items
BALLN ~~LOC~~ EMERGE MR 3.25X8 (BALLOONS) ×2
BALLOON ~~LOC~~ EMERGE MR 3.25X8 (BALLOONS) IMPLANT
CATH INFINITI 5 FR JL3.5 (CATHETERS) ×1 IMPLANT
CATH INFINITI 5FR ANG PIGTAIL (CATHETERS) ×1 IMPLANT
CATH INFINITI JR4 5F (CATHETERS) ×1 IMPLANT
CATH LAUNCHER 5F JL3.5 (CATHETERS) IMPLANT
CATHETER LAUNCHER 5F JL3.5 (CATHETERS) ×2
COVER PRB 48X5XTLSCP FOLD TPE (BAG) IMPLANT
COVER PROBE 5X48 (BAG) ×2
DEVICE RAD COMP TR BAND LRG (VASCULAR PRODUCTS) ×1 IMPLANT
GLIDESHEATH SLEND SS 6F .021 (SHEATH) ×1 IMPLANT
GUIDEWIRE INQWIRE 1.5J.035X260 (WIRE) IMPLANT
GUIDEWIRE PRESSURE COMET II (WIRE) ×1 IMPLANT
INQWIRE 1.5J .035X260CM (WIRE) ×2
KIT ENCORE 26 ADVANTAGE (KITS) ×1 IMPLANT
KIT HEART LEFT (KITS) ×2 IMPLANT
PACK CARDIAC CATHETERIZATION (CUSTOM PROCEDURE TRAY) ×2 IMPLANT
STENT PROMUS PREM MR 3.0X12 (Permanent Stent) ×1 IMPLANT
SYR MEDRAD MARK V 150ML (SYRINGE) ×2 IMPLANT
TRANSDUCER W/STOPCOCK (MISCELLANEOUS) ×2 IMPLANT
TUBING CIL FLEX 10 FLL-RA (TUBING) ×2 IMPLANT
WIRE HI TORQ VERSACORE-J 145CM (WIRE) ×1 IMPLANT

## 2017-04-18 NOTE — Progress Notes (Signed)
TR BAND REMOVAL  LOCATION:    right radial  DEFLATED PER PROTOCOL:    Yes.    TIME BAND OFF / DRESSING APPLIED:    1300   SITE UPON ARRIVAL:    Level 0  SITE AFTER BAND REMOVAL:    Level 0  CIRCULATION SENSATION AND MOVEMENT:    Within Normal Limits   Yes.    COMMENTS:   Rechecked site at 1330 and frequently during shift with no change in assessment

## 2017-04-18 NOTE — Progress Notes (Signed)
Inpatient Diabetes Program Recommendations  AACE/ADA: New Consensus Statement on Inpatient Glycemic Control (2015)  Target Ranges:  Prepandial:   less than 140 mg/dL      Peak postprandial:   less than 180 mg/dL (1-2 hours)      Critically ill patients:  140 - 180 mg/dL   Results for MAHAYLA, HADDAWAY (MRN [id number]) as of 04/18/2017 09:01  Ref. Range 04/17/2017 07:59 04/17/2017 11:25 04/17/2017 16:26 04/17/2017 21:06 04/18/2017 08:54  Glucose-Capillary Latest Ref Range: 65 - 99 mg/dL 200 (H) 235 (H) 235 (H) 204 (H) 188 (H)  Results for JASMAIN, AHLBERG (MRN [id number]) as of 04/18/2017 09:01  Ref. Range 04/16/2017 18:23  Hemoglobin A1C Latest Ref Range: 4.8 - 5.6 % 7.5 (H)   Review of Glycemic Control  Diabetes history: DM2 Outpatient Diabetes medications: Metformin 500 mg BID Current orders for Inpatient glycemic control: Metformin 500 mg BID  Inpatient Diabetes Program Recommendations: Correction (SSI): While inpatient, please consider ordering CBGs with Novolog 0-9 units Q4H (or ACHS when diet resumed).  Thanks, Barnie Alderman, RN, MSN, CDE Diabetes Coordinator Inpatient Diabetes Program [phone] (Team Pager from 8am to 5pm)

## 2017-04-18 NOTE — Plan of Care (Signed)
  Progressing Activity: Risk for activity intolerance will decrease 04/18/2017 0320 - Progressing by Ruben Im, RN Pain Managment: General experience of comfort will improve 04/18/2017 0320 - Progressing by Ruben Im, RN Safety: Ability to remain free from injury will improve 04/18/2017 0320 - Progressing by Ruben Im, RN   Completed/Met Nutrition: Adequate nutrition will be maintained 04/18/2017 0320 - Completed/Met by Ruben Im, RN Coping: Level of anxiety will decrease 04/18/2017 0320 - Completed/Met by Ruben Im, RN

## 2017-04-18 NOTE — Progress Notes (Signed)
  Echocardiogram 2D Echocardiogram has been performed.  [name] 04/18/2017, 3:32 PM

## 2017-04-18 NOTE — Care Management Note (Addendum)
Case Management Note  Patient Details  Name: KARNE OZGA MRN: [id number] Date of Birth: 1961-03-14  Subjective/Objective:   From home, s/p coronary stent intervention, will be on brilinta, NCM awaiting benefit check.   Per RN, patient is having problems with sob, which is an side effect of brilinta, she would like to be switched to plavix.  NCM awaiting to hear about MD choice .  MD changed patient to plavix. No other needs.               Action/Plan: DC home no needs.   Expected Discharge Date:                  Expected Discharge Plan:  Home/Self Care  In-House Referral:     Discharge planning Services  CM Consult  Post Acute Care Choice:    Choice offered to:     DME Arranged:    DME Agency:     HH Arranged:    HH Agency:     Status of Service:  In process, will continue to follow  If discussed at Long Length of Stay Meetings, dates discussed:    Additional Comments:  Zenon Mayo, RN 04/18/2017, 3:29 PM

## 2017-04-18 NOTE — Interval H&P Note (Signed)
History and Physical Interval Note:  04/18/2017 7:23 AM  [name]  has presented today for surgery, with the diagnosis of unstable angina  The various methods of treatment have been discussed with the patient and family. After consideration of risks, benefits and other options for treatment, the patient has consented to  Procedure(s): LEFT HEART CATH AND CORONARY ANGIOGRAPHY (N/A) as a surgical intervention .  The patient's history has been reviewed, patient examined, no change in status, stable for surgery.  I have reviewed the patient's chart and labs.  Questions were answered to the patient's satisfaction.   Cath Lab Visit (complete for each Cath Lab visit)  Clinical Evaluation Leading to the Procedure:   ACS: Yes.    Non-ACS:    Anginal Classification: CCS IV  Anti-ischemic medical therapy: Minimal Therapy (1 class of medications)  Non-Invasive Test Results: No non-invasive testing performed  Prior CABG: No previous CABG        Collier Salina [facility] 04/18/2017 7:23 AM

## 2017-04-18 NOTE — Progress Notes (Signed)
Progress Note  Patient Name: [name] Date of Encounter: 04/18/2017  Primary Cardiologist: Dr. Aundra Dubin  Subjective   Feeling well.  No chest pain but is short of breath at rest.   Inpatient Medications    Scheduled Meds: . aspirin  81 mg Oral Daily  . enalapril  5 mg Oral Daily  . fenofibrate  160 mg Oral Daily  . heparin  5,000 Units Subcutaneous Q8H  . insulin aspart  0-15 Units Subcutaneous TID WC  . loratadine  10 mg Oral Daily  . metoprolol succinate  25 mg Oral Daily  . montelukast  10 mg Oral QHS  . pantoprazole  40 mg Oral Daily  . rosuvastatin  20 mg Oral Daily  . sodium chloride flush  3 mL Intravenous Q12H  . ticagrelor  90 mg Oral BID  . vitamin C  1,000 mg Oral Daily  . vitamin E  1,000 Units Oral Daily   Continuous Infusions: . sodium chloride    . sodium chloride 1 mL/kg/hr (04/18/17 0851)   PRN Meds: sodium chloride, cyclobenzaprine, diphenoxylate-atropine, LORazepam, nitroGLYCERIN, sodium chloride flush, zolpidem   Vital Signs    Vitals:   04/18/17 0900 04/18/17 0915 04/18/17 0935 04/18/17 1110  BP: 130/63 (!) 144/64 (!) 135/58   Pulse: 76 72 74   Resp: 18 17 15    Temp:    98.4 F (36.9 C)  TempSrc:    Oral  SpO2: 96% 96% 97%   Weight:      Height:        Intake/Output Summary (Last 24 hours) at 04/18/2017 1343 Last data filed at 04/18/2017 1306 Gross per 24 hour  Intake 1346.16 ml  Output 2975 ml  Net -1628.84 ml   Filed Weights   04/16/17 2027 04/17/17 0519 04/18/17 0456  Weight: 64.3 kg (141 lb 12.8 oz) 64 kg (141 lb) 63.4 kg (139 lb 12.8 oz)    Telemetry    Sinus rhythm with short runs of SVT.  - Personally Reviewed  ECG    04/18/17: Sinus rhythm.  Rate 82 bpm.  Anterolateral TWI.  - Personally Reviewed  Physical Exam   VS:  BP (!) 135/58   Pulse 74   Temp 98.4 F (36.9 C) (Oral)   Resp 15   Ht 5' 3"  (1.6 m)   Wt 63.4 kg (139 lb 12.8 oz)   LMP  (LMP Unknown)   SpO2 97%   BMI 24.76 kg/m  , BMI Body mass  index is 24.76 kg/m. GENERAL:  Well appearing HEENT: Pupils equal round and reactive, fundi not visualized, oral mucosa unremarkable NECK:  No jugular venous distention, waveform within normal limits, carotid upstroke brisk and symmetric, no bruits, no thyromegaly LUNGS:  Clear to auscultation bilaterally HEART:  RRR.  PMI not displaced or sustained,S1 and S2 within normal limits, no S3, no S4, no clicks, no rubs, no  murmurs ABD:  Flat, positive bowel sounds normal in frequency in pitch, no bruits, no rebound, no guarding, no midline pulsatile mass, no hepatomegaly, no splenomegaly EXT:  2 plus pulses throughout, no edema, no cyanosis no clubbing.  Ra radial cath site stable.  SKIN:  No rashes no nodules NEURO:  Cranial nerves II through XII grossly intact, motor grossly intact throughout PSYCH:  Cognitively intact, oriented to person place and time   Labs    Chemistry Recent Labs  Lab 04/16/17 1330 04/17/17 0424  NA 137 139  K 3.7 4.2  CL 106 107  CO2 24  26  GLUCOSE 186* 182*  BUN 13 17  CREATININE 0.60 0.63  CALCIUM 9.1 9.4  PROT 6.0*  --   ALBUMIN 3.8  --   AST 35  --   ALT 29  --   ALKPHOS 72  --   BILITOT 0.5  --   GFRNONAA >60 >60  GFRAA >60 >60  ANIONGAP 7 6     Hematology Recent Labs  Lab 04/16/17 1330 04/17/17 0424 04/18/17 0511  WBC 10.0 9.3 7.7  RBC 4.39 4.36 4.25  HGB 13.5 13.5 13.1  HCT 40.3 40.5 39.4  MCV 91.8 92.9 92.7  MCH 30.8 31.0 30.8  MCHC 33.5 33.3 33.2  RDW 12.6 12.8 12.7  PLT 271 259 248    Cardiac EnzymesNo results for input(s): TROPONINI in the last 168 hours.  Recent Labs  Lab 04/16/17 1348 04/16/17 1758  TROPIPOC 0.16* 0.15*     BNPNo results for input(s): BNP, PROBNP in the last 168 hours.   DDimer  Recent Labs  Lab 04/16/17 1330  DDIMER <0.27     Radiology    Dg Chest Portable 1 View  Result Date: 04/16/2017 CLINICAL DATA:  Chest pain and shortness of breath EXAM: PORTABLE CHEST 1 VIEW COMPARISON:  05/17/2014  FINDINGS: The heart size and mediastinal contours are within normal limits. Both lungs are clear. Minimal scarring is noted in the left base stable from the prior study. The visualized skeletal structures are unremarkable. IMPRESSION: No active disease. Electronically Signed   By: Inez Catalina M.D.   On: 04/16/2017 14:03    Cardiac Studies   LHC 04/18/17:  Prox LAD lesion is 65% stenosed.  A drug-eluting stent was successfully placed using a STENT PROMUS PREM MR 3.0X12.  Post intervention, there is a 0% residual stenosis.  The left ventricular systolic function is normal.  LV end diastolic pressure is normal.  The left ventricular ejection fraction is 50-55% by visual estimate.  Patient Profile     56 y.o. female with CAD, hypertension, hyperlipidemia, diabetes, GERD and Chron's disease here with unstable angina.  She was found to have 65% LAD stenosis and underwent successful PCI with DES.   Assessment & Plan    # Unstable angina:  # Hyperlipidemia: LHC showed 65% proximal LAD disease with FFR 0.82.  Given her typical angina, EKG changes and anterior hypokinesis on LVgram, a DES was placed.  She has mild dyspnea.  Will continue ticagrelor for now.  Continue aspirin, metoprolol and rosuvastatin.  LDL was 45 11/2016 on this dose of rosuvastatin. Echo pending.   # Hypertension: BP is a bit labile.  Carvedilol was added this admission.  Continue enalapril.   For questions or updates, please contact [place] Please consult [email or web address] for contact info under Cardiology/STEMI.      Signed, Skeet Latch, MD  04/18/2017, 1:43 PM

## 2017-04-19 ENCOUNTER — Telehealth: Payer: Self-pay | Admitting: Gastroenterology

## 2017-04-19 ENCOUNTER — Encounter (HOSPITAL_COMMUNITY): Payer: Self-pay | Admitting: Cardiology

## 2017-04-19 ENCOUNTER — Telehealth: Payer: Self-pay | Admitting: Cardiology

## 2017-04-19 ENCOUNTER — Other Ambulatory Visit: Payer: Self-pay

## 2017-04-19 DIAGNOSIS — Z95820 Peripheral vascular angioplasty status with implants and grafts: Secondary | ICD-10-CM

## 2017-04-19 HISTORY — DX: Peripheral vascular angioplasty status with implants and grafts: Z95.820

## 2017-04-19 LAB — CBC
HEMATOCRIT: 39.1 % (ref 36.0–46.0)
HEMOGLOBIN: 13 g/dL (ref 12.0–15.0)
MCH: 30.7 pg (ref 26.0–34.0)
MCHC: 33.2 g/dL (ref 30.0–36.0)
MCV: 92.2 fL (ref 78.0–100.0)
Platelets: 258 10*3/uL (ref 150–400)
RBC: 4.24 MIL/uL (ref 3.87–5.11)
RDW: 12.9 % (ref 11.5–15.5)
WBC: 8 10*3/uL (ref 4.0–10.5)

## 2017-04-19 LAB — BASIC METABOLIC PANEL
ANION GAP: 9 (ref 5–15)
BUN: 15 mg/dL (ref 6–20)
CHLORIDE: 106 mmol/L (ref 101–111)
CO2: 23 mmol/L (ref 22–32)
Calcium: 8.9 mg/dL (ref 8.9–10.3)
Creatinine, Ser: 0.71 mg/dL (ref 0.44–1.00)
GFR calc Af Amer: >60 mL/min (ref >60)
GLUCOSE: 189 mg/dL — AB (ref 65–99)
POTASSIUM: 3.9 mmol/L (ref 3.5–5.1)
Sodium: 138 mmol/L (ref 135–145)

## 2017-04-19 LAB — GLUCOSE, CAPILLARY
Glucose-Capillary: 222 mg/dL — ABNORMAL HIGH (ref 65–99)
Glucose-Capillary: 185 mg/dL — ABNORMAL HIGH (ref 65–99)

## 2017-04-19 MED ORDER — CLOPIDOGREL BISULFATE 75 MG PO TABS: 75.0000 mg | ORAL_TABLET | Freq: Every day | ORAL | Status: DC

## 2017-04-19 MED ORDER — METOPROLOL SUCCINATE ER 25 MG PO TB24: 25.0000 mg | ORAL_TABLET | Freq: Every day | ORAL | 6 refills | Status: DC

## 2017-04-19 MED ORDER — METFORMIN HCL 500 MG PO TABS: 500.0000 mg | ORAL_TABLET | Freq: Two times a day (BID) | ORAL | 3 refills | Status: DC

## 2017-04-19 MED ORDER — CLOPIDOGREL BISULFATE 75 MG PO TABS: 75.0000 mg | ORAL_TABLET | Freq: Every day | ORAL | 6 refills | Status: DC

## 2017-04-19 MED ORDER — METOPROLOL SUCCINATE ER 50 MG PO TB24: 50.0000 mg | ORAL_TABLET | Freq: Every day | ORAL | Status: DC

## 2017-04-19 MED ORDER — PRAVASTATIN SODIUM 10 MG PO TABS
10.0000 mg | ORAL_TABLET | Freq: Every day | ORAL | 6 refills | Status: DC
Start: 2017-04-19 — End: 2017-09-22

## 2017-04-19 MED ORDER — ACETAMINOPHEN 325 MG PO TABS: 650.0000 mg | ORAL_TABLET | Freq: Four times a day (QID) | ORAL | Status: DC | PRN

## 2017-04-19 MED ORDER — BUDESONIDE 3 MG PO CPEP: 9.0000 mg | ORAL_CAPSULE | Freq: Every day | ORAL | 0 refills | Status: DC

## 2017-04-19 MED ORDER — PANTOPRAZOLE SODIUM 40 MG PO TBEC: 40.0000 mg | DELAYED_RELEASE_TABLET | Freq: Every day | ORAL | 6 refills | Status: DC

## 2017-04-19 MED ORDER — CLOPIDOGREL BISULFATE 75 MG PO TABS
300.0000 mg | ORAL_TABLET | Freq: Every day | ORAL | Status: DC
  Administered 2017-04-19: 12:00:00 300 mg via ORAL
  Filled 2017-04-19: qty 4

## 2017-04-19 MED ORDER — METOPROLOL SUCCINATE ER 50 MG PO TB24: 50.0000 mg | ORAL_TABLET | Freq: Every day | ORAL | 6 refills | Status: DC

## 2017-04-19 MED ORDER — PRAVASTATIN SODIUM 10 MG PO TABS
10.0000 mg | ORAL_TABLET | Freq: Every day | ORAL | Status: DC
  Filled 2017-04-19: qty 1

## 2017-04-19 NOTE — Discharge Summary (Signed)
Discharge Summary    Patient ID: [name],  MRN: [id number], DOB/AGE: 1960/06/12 56 y.o.  Admit date: 04/16/2017 Discharge date: 04/19/2017  Primary Care Provider: Leonides Sake Primary Cardiologist: Dr. Aundra Dubin last seen in 2016  NOW Dr. Saunders Revel  Discharge Diagnoses    Principal Problem:   NSTEMI (non-ST elevated myocardial infarction) [facility]) Active Problems:   S/P angioplasty with stent 04/18/17 with DES to pLAD   CAD (coronary artery disease)   Hyperlipidemia   Essential hypertension   Diabetes mellitus type 2, uncomplicated (HCC)   Chest pain   SOB (shortness of breath)   Family history of early CAD   Allergies Allergies  Allergen Reactions  . Lipitor [Atorvastatin] Other (See Comments)    Elevated ALT    Diagnostic Studies/Procedures    04/18/17 Procedures   CORONARY STENT INTERVENTION  INTRAVASCULAR PRESSURE WIRE/FFR STUDY  LEFT HEART CATH AND CORONARY ANGIOGRAPHY  Conclusion     Prox LAD lesion is 65% stenosed.  A drug-eluting stent was successfully placed using a STENT PROMUS PREM MR 3.0X12.  Post intervention, there is a 0% residual stenosis.  The left ventricular systolic function is normal.  LV end diastolic pressure is normal.  The left ventricular ejection fraction is 50-55% by visual estimate.   1. Single vessel obstructive CAD. There is focal and somewhat hazy lesion in the proximal LAD. iFr was 0.88 with FFR of .82. This is of borderline significance but given her clinical scenario with typical chest pain, Ecg changes, and T wave abnormality along with anterior wall motion abnormality we decided to treat this lesion.  2. Overall good LV function 3. Normal LVEDP 4. Successful stenting of the proximal LAD with DES  Plan: DAPT for one year. Anticipate DC tomorrow.    Echo 04/19/17  Study Conclusions  - Left ventricle: The cavity size was normal. Wall thickness was   normal. Systolic function was normal. The estimated ejection   fraction was in the range of 55% to 60%. Mild hypokinesis of the   apicalanteroseptal myocardium. Doppler parameters are consistent   with abnormal left ventricular relaxation (grade 1 diastolic   dysfunction). - Pulmonary arteries: Systolic pressure was mildly increased. PA   peak pressure: 35 mm Hg (S).  _____________   History of Present Illness     56 year old female with HTN, HLP, DM2, known non-obstructive CAD on Coronary CTA in 2012, significant FH od early CAD, followed by DR END, negative stress test in 02/2016 who presented  04/16/17 with a typical retrosternal chest pain that started on Thursday 11/15 and on and off since then. ECG shows new negative T waves in the lateral T waves. Troponin was elevated at 0.16-->0.15. And IV heparin was added.  She was admitted for further eval.      Hospital Course     Consultants: none   With elevated troponins and premature FH of CAD along with pt's diabetes cardiac cath was planned.  BB was added. By 04/18/17 she underwent cardiac cath receiving PCI to LAD, see above.    She did well post procedure, though she does have SOB with the Brilinta and did not wish to take.  She was given 300 mg po here and will begin 75 mg daily tomorrow.  With Plavix I changed her nexium to protonix.  We also increased BB to 50 mg due to elevated BP.  We have recommended she follow up with Cardiac Rehab.    She was not taking  Crestor so we have placed pt on pravastatin but will need lipid panel done in 6-8 weeks.  Pt seen and evaluated by Dr. Oval Linsey and found stable for discharge.  She did walk with cardiac rehab and did well.  _____________  Discharge Vitals Blood pressure (!) 159/72, pulse 85, temperature 98.4 F (36.9 C), temperature source Oral, resp. rate 16, height 5' 3"  (1.6 m), weight 136 lb 11 oz (62 kg), SpO2 100 %.  Filed Weights   04/17/17 0519 04/18/17 0456 04/19/17 0533  Weight: 141 lb (64 kg) 139 lb 12.8 oz (63.4 kg) 136 lb 11 oz (62 kg)    General:Pleasant affect, NAD Skin:Warm and dry, brisk capillary refill HEENT:normocephalic, sclera clear, mucus membranes moist Neck:supple, no JVD, no bruits  Heart:S1S2 RRR without murmur, gallup, rub or click Lungs:clear without rales, rhonchi, or wheezes NAT:FTDD, non tender, + BS, do not palpate liver spleen or masses Ext:no lower ext edema, 2+ pedal pulses, 2+ radial pulses Neuro:alert and oriented, MAE, follows commands, + facial symmetry  Labs & Radiologic Studies    CBC Recent Labs    04/18/17 0511 04/19/17 0309  WBC 7.7 8.0  HGB 13.1 13.0  HCT 39.4 39.1  MCV 92.7 92.2  PLT 248 220   Basic Metabolic Panel Recent Labs    04/16/17 1823 04/17/17 0424 04/19/17 0309  NA  --  139 138  K  --  4.2 3.9  CL  --  107 106  CO2  --  26 23  GLUCOSE  --  182* 189*  BUN  --  17 15  CREATININE  --  0.63 0.71  CALCIUM  --  9.4 8.9  MG 1.8  --   --    Liver Function Tests Recent Labs    04/16/17 1330  AST 35  ALT 29  ALKPHOS 72  BILITOT 0.5  PROT 6.0*  ALBUMIN 3.8   Recent Labs    04/16/17 1330  LIPASE 29   Cardiac Enzymes No results for input(s): CKTOTAL, CKMB, CKMBINDEX, TROPONINI in the last 72 hours. BNP Invalid input(s): POCBNP D-Dimer Recent Labs    04/16/17 1330  DDIMER <0.27   Hemoglobin A1C Recent Labs    04/16/17 1823  HGBA1C 7.5*   Fasting Lipid Panel No results for input(s): CHOL, HDL, LDLCALC, TRIG, CHOLHDL, LDLDIRECT in the last 72 hours. Thyroid Function Tests Recent Labs    04/16/17 1823  TSH 3.272   _____________  Dg Chest Portable 1 View  Result Date: 04/16/2017 CLINICAL DATA:  Chest pain and shortness of breath EXAM: PORTABLE CHEST 1 VIEW COMPARISON:  05/17/2014 FINDINGS: The heart size and mediastinal contours are within normal limits. Both lungs are clear. Minimal scarring is noted in the left base stable from the prior study. The visualized skeletal structures are unremarkable. IMPRESSION: No active disease.  Electronically Signed   By: Inez Catalina M.D.   On: 04/16/2017 14:03   Disposition   Pt is being discharged home today in good condition.  Follow-up Plans & Appointments   Hold Metformin until the 22nd it may interact with the cath dye  Take 1 NTG, under your tongue, while sitting.  If no relief of pain may repeat NTG, one tab every 5 minutes up to 3 tablets total over 15 minutes.  If no relief CALL 911.  If you have dizziness/lightheadness  while taking NTG, stop taking and call 911.        Call [facility] at [phone] if any bleeding,  swelling or drainage at cath site.  May shower, no tub baths for 48 hours for groin sticks. No lifting over 5 pounds for 3 days.  No Driving for 3 days  May return to work on Tuesday the 27th of NOV  Heart healthy diabetic diet   Do not miss the plavix or asprin this is to keep stent from clotting Follow-up Information    End, Harrell Gave, MD Follow up on 05/03/2017.   Specialty:  Cardiology Why:  with Vin his PA,  at 8:45 AM   Contact information: [place] [address] [phone]          Discharge Instructions    AMB Referral to Cardiac Rehabilitation - Phase II   Complete by:  As directed    Diagnosis:  NSTEMI   Amb Referral to Cardiac Rehabilitation   Complete by:  As directed    Diagnosis:   NSTEMI Coronary Stents        Discharge Medications   Current Discharge Medication List    START taking these medications   Details  acetaminophen (TYLENOL) 325 MG tablet Take 2 tablets (650 mg total) by mouth every 6 (six) hours as needed for headache.    clopidogrel (PLAVIX) 75 MG tablet Take 1 tablet (75 mg total) by mouth daily. Qty: 30 tablet, Refills: 6    metoprolol succinate (TOPROL-XL) 50 MG 24 hr tablet Take 1 tablet (50 mg total) by mouth daily. Take with or immediately following a meal. Qty: 30 tablet, Refills: 6    pantoprazole (PROTONIX) 40 MG tablet Take 1 tablet (40  mg total) by mouth daily. Qty: 30 tablet, Refills: 6    pravastatin (PRAVACHOL) 10 MG tablet Take 1 tablet (10 mg total) by mouth daily at 6 PM. Qty: 30 tablet, Refills: 6      CONTINUE these medications which have CHANGED   Details  metFORMIN (GLUCOPHAGE) 500 MG tablet Take 1 tablet (500 mg total) by mouth 2 (two) times daily with a meal. Qty: 180 tablet, Refills: 3   Associated Diagnoses: Diabetes mellitus type 2, uncomplicated ([place])      CONTINUE these medications which have NOT CHANGED   Details  Ascorbic Acid (VITAMIN C PO) Take 1,000 mg daily by mouth.    aspirin 81 MG chewable tablet Chew 81 mg by mouth daily.      enalapril (VASOTEC) 5 MG tablet Take 1 tablet (5 mg total) by mouth daily. Qty: 30 tablet, Refills: 11    fenofibrate (TRICOR) 145 MG tablet Take 1 tablet (145 mg total) by mouth daily. Please make an appt with the Doctor before anymore refills. 1st attempt Qty: 30 tablet, Refills: 0    fexofenadine (ALLEGRA) 180 MG tablet Take 1 tablet (180 mg total) by mouth daily. Qty: 90 tablet, Refills: 3   Associated Diagnoses: Seasonal and perennial allergic rhinitis    HUMIRA PEN 40 MG/0.8ML PNKT Inject 40 mg into the skin every 14 (fourteen) days. Refills: 10    levocetirizine (XYZAL) 5 MG tablet Take 5 mg by mouth every evening.    montelukast (SINGULAIR) 10 MG tablet Take 10 mg by mouth at bedtime.    vitamin E 1000 UNIT capsule Take 1,000 Units daily by mouth.    cyclobenzaprine (FLEXERIL) 10 MG tablet Take 0.5-1 tablets (5-10 mg total) by mouth 3 (three) times daily as needed for muscle spasms. Qty: 30 tablet, Refills: 0   Associated Diagnoses: Back pain; Sacroiliitis, not elsewhere classified ([place])  diphenoxylate-atropine (LOMOTIL) 2.5-0.025 MG tablet Take 1 tablet by mouth 3 (three) times daily. Qty: 90 tablet, Refills: 1    EPIPEN 2-PAK 0.3 MG/0.3ML SOAJ injection Reported on 06/27/2015 Refills: 0    LORazepam (ATIVAN) 0.5 MG tablet Take 2 tablets (1  mg total) by mouth every 6 (six) hours as needed (Dizziness). Qty: 20 tablet, Refills: 0    zolpidem (AMBIEN) 10 MG tablet Take 10 mg by mouth at bedtime as needed for sleep.       STOP taking these medications     NEXIUM 40 MG capsule      rosuvastatin (CRESTOR) 20 MG tablet          Aspirin prescribed at discharge?  Yes High Intensity Statin Prescribed? (Lipitor 40-12m or Crestor 20-445m: Yes Beta Blocker Prescribed? Yes For EF <40%, was ACEI/ARB Prescribed? No: NA ADP Receptor Inhibitor Prescribed? (i.e. Plavix etc.-Includes Medically Managed Patients): Yes For EF <40%, Aldosterone Inhibitor Prescribed? No: NA Was EF assessed during THIS hospitalization? Yes Was Cardiac Rehab II ordered? (Included Medically managed Patients): Yes   Outstanding Labs/Studies   Lipids in 6-8 weeks  Duration of Discharge Encounter   Greater than 30 minutes including physician time.  Signed, LaCecilie KicksP 04/19/2017, 11:16 AM

## 2017-04-19 NOTE — Progress Notes (Signed)
#   5.  MEGAN @ [facility] # [phone]   BRILINTA  90 MG BID   COVER- YES  CO-PAY- $ 65.00  TIER- 3 DRUG  PRIOR APPROVAL- NO   DEDUCTIBLE : NO   PREFERRED PHARMACY : [facility]

## 2017-04-19 NOTE — Telephone Encounter (Signed)
Discharged from the hospital today. She has an appointment next week. She asks what the plan will be while waiting to start new biologic.

## 2017-04-19 NOTE — Progress Notes (Signed)
CARDIAC REHAB PHASE I   PRE:  Rate/Rhythm: 81 SR  BP:  Sitting: 136/72      SaO2: 96% RA  MODE:  Ambulation: 500 ft   POST:  Rate/Rhythm: 89 SR  BP:  Sitting: 152/80      SaO2: 97% RA   Pt in bed. Pt stated she has not been feeling well due to Chrons and Brillinta. Pt encouraged to let Dr know how she has been feeling. Pt denied needing anything from nurse. Pt agreed to walk. Pt ambulated for 500 ft with steady gait. Pt denied any complaints of CP. Pt had mild SOB, but stated it was due to [place]. Pt returned to bed per pt request. Ed complete with pt. Reviewed MI booklet, stent card, NTG, anti-platelet therapy, restrictions, exercise guidelines, heart healthy/diabetic diet, and CRPII. Pt is a little hesitant about CRPII and work schedule, but agreed to [facility] referral. Will refer to [place] CRPII per pt request.   [phone]  [name] Lair MS, ACSM CEP  9:05 AM 04/19/2017

## 2017-04-19 NOTE — Telephone Encounter (Signed)
Sorry. Omitted the symptoms. She is having low abdominal discomfort and discomfort. Passes liquid bowel movement with every visit to the toilet. She has lomotil with SIG for tid prn which she is instructed to use. Budesonide to her pharmacy. Unsure of coverage. She will let me know if there are any issues.

## 2017-04-19 NOTE — Progress Notes (Signed)
Patient was not given coreg and Lipitor after evening meal last night. Notified Cecilie Kicks NP.

## 2017-04-19 NOTE — Discharge Instructions (Signed)
Hold Metformin until the 22nd it may interact with the cath dye  Take 1 NTG, under your tongue, while sitting.  If no relief of pain may repeat NTG, one tab every 5 minutes up to 3 tablets total over 15 minutes.  If no relief CALL 911.  If you have dizziness/lightheadness  while taking NTG, stop taking and call 911.        Call [facility] at [phone] if any bleeding, swelling or drainage at cath site.  May shower, no tub baths for 48 hours for groin sticks. No lifting over 5 pounds for 3 days.  No Driving for 3 days  May return to work on Tuesday the 27th of NOV  Heart healthy diabetic diet   Do not miss the plavix or asprin this is to keep stent from clotting

## 2017-04-19 NOTE — Progress Notes (Signed)
Progress Note  Patient Name: [name] Date of Encounter: 04/19/2017  Primary Cardiologist: Dr. Aundra Dubin  Subjective   Didn't sleep well.  Complains of shortness of breath and Crohn's is not well-controlled.    Inpatient Medications    Scheduled Meds: . aspirin  81 mg Oral Daily  . clopidogrel  75 mg Oral Daily  . enalapril  5 mg Oral Daily  . fenofibrate  160 mg Oral Daily  . heparin  5,000 Units Subcutaneous Q8H  . insulin aspart  0-15 Units Subcutaneous TID WC  . loratadine  10 mg Oral Daily  . metoprolol succinate  25 mg Oral Daily  . montelukast  10 mg Oral QHS  . pantoprazole  40 mg Oral Daily  . rosuvastatin  20 mg Oral Daily  . sodium chloride flush  3 mL Intravenous Q12H  . vitamin C  1,000 mg Oral Daily  . vitamin E  1,000 Units Oral Daily   Continuous Infusions: . sodium chloride     PRN Meds: sodium chloride, acetaminophen, cyclobenzaprine, diphenoxylate-atropine, LORazepam, nitroGLYCERIN, sodium chloride flush, zolpidem   Vital Signs    Vitals:   04/18/17 1921 04/18/17 2000 04/19/17 0533 04/19/17 0748  BP: 114/80  139/67 (!) 159/72  Pulse: 83  80 85  Resp: 18 19 16    Temp: 98.5 F (36.9 C)  97.8 F (36.6 C) 98.4 F (36.9 C)  TempSrc: Oral  Oral Oral  SpO2: 97%  100% 100%  Weight:   62 kg (136 lb 11 oz)   Height:        Intake/Output Summary (Last 24 hours) at 04/19/2017 1059 Last data filed at 04/19/2017 0700 Gross per 24 hour  Intake 1020 ml  Output 2200 ml  Net -1180 ml   Filed Weights   04/17/17 0519 04/18/17 0456 04/19/17 0533  Weight: 64 kg (141 lb) 63.4 kg (139 lb 12.8 oz) 62 kg (136 lb 11 oz)    Telemetry    Sinus rhythm with short runs of SVT.  - Personally Reviewed  ECG    04/18/17: Sinus rhythm.  Rate 82 bpm.  Anterolateral TWI.  - Personally Reviewed  Physical Exam   VS:  BP (!) 159/72 (BP Location: Left Arm)   Pulse 85   Temp 98.4 F (36.9 C) (Oral)   Resp 16   Ht 5' 3"  (1.6 m)   Wt 62 kg (136 lb 11 oz)    LMP  (LMP Unknown)   SpO2 100%   BMI 24.21 kg/m  , BMI Body mass index is 24.21 kg/m. GENERAL:  Well appearing HEENT: Pupils equal round and reactive, fundi not visualized, oral mucosa unremarkable NECK:  No jugular venous distention, waveform within normal limits, carotid upstroke brisk and symmetric, no bruits, no thyromegaly LUNGS:  Clear to auscultation bilaterally HEART:  RRR.  PMI not displaced or sustained,S1 and S2 within normal limits, no S3, no S4, no clicks, no rubs, no murmurs ABD:  Flat, positive bowel sounds normal in frequency in pitch, no bruits, no rebound, no guarding, no midline pulsatile mass, no hepatomegaly, no splenomegaly EXT:  2 plus pulses throughout, no edema, no cyanosis no clubbing.  R wrist stable.  SKIN:  No rashes no nodules NEURO:  Cranial nerves II through XII grossly intact, motor grossly intact throughout [facility]:  Cognitively intact, oriented to person place and time    Labs    Chemistry Recent Labs  Lab 04/16/17 1330 04/17/17 0424 04/19/17 0309  NA 137 139 138  K 3.7 4.2 3.9  CL 106 107 106  CO2 24 26 23   GLUCOSE 186* 182* 189*  BUN 13 17 15   CREATININE 0.60 0.63 0.71  CALCIUM 9.1 9.4 8.9  PROT 6.0*  --   --   ALBUMIN 3.8  --   --   AST 35  --   --   ALT 29  --   --   ALKPHOS 72  --   --   BILITOT 0.5  --   --   GFRNONAA >60 >60 >60  GFRAA >60 >60 >60  ANIONGAP 7 6 9      Hematology Recent Labs  Lab 04/17/17 0424 04/18/17 0511 04/19/17 0309  WBC 9.3 7.7 8.0  RBC 4.36 4.25 4.24  HGB 13.5 13.1 13.0  HCT 40.5 39.4 39.1  MCV 92.9 92.7 92.2  MCH 31.0 30.8 30.7  MCHC 33.3 33.2 33.2  RDW 12.8 12.7 12.9  PLT 259 248 258    Cardiac EnzymesNo results for input(s): TROPONINI in the last 168 hours.  Recent Labs  Lab 04/16/17 1348 04/16/17 1758  TROPIPOC 0.16* 0.15*     BNPNo results for input(s): BNP, PROBNP in the last 168 hours.   DDimer  Recent Labs  Lab 04/16/17 1330  DDIMER <0.27     Radiology    No results  found.  Cardiac Studies   LHC 04/18/17:  Prox LAD lesion is 65% stenosed.  A drug-eluting stent was successfully placed using a STENT PROMUS PREM MR 3.0X12.  Post intervention, there is a 0% residual stenosis.  The left ventricular systolic function is normal.  LV end diastolic pressure is normal.  The left ventricular ejection fraction is 50-55% by visual estimate.  Patient Profile     56 y.o. female with CAD, hypertension, hyperlipidemia, diabetes, GERD and Chron's disease here with unstable angina.  She was found to have 65% LAD stenosis and underwent successful PCI with DES.   Assessment & Plan    # Unstable angina:  # Hyperlipidemia: LHC showed 65% proximal LAD disease with FFR 0.82.  Given her typical angina, EKG changes and anterior hypokinesis on LVgram, a DES was placed.  She has mild dyspnea.  Will continue ticagrelor for now.  Continue aspirin, metoprolol and rosuvastatin.  LDL was 45 11/2016 off statins.  She take a cholesterol supplement but is unsure of the name.  She had myalgias on atorvastatin and rosuvastatin.  We will start pravastatin 10 mg daily.  Repeat lipids and CMP in 6 weeks.   # Hypertension: BP is a bit labile.  Metoprolol was added this admission. We will increase the dose to 62m  Continue enalapril.   For questions or updates, please contact [place]Please consult [email or web address] for contact info under Cardiology/STEMI.      Signed, TSkeet Latch MD  04/19/2017, 10:59 AM

## 2017-04-19 NOTE — Telephone Encounter (Signed)
TCM Patient-Please call Patient-Appointment on 05-03-17 with Vin.

## 2017-04-19 NOTE — Telephone Encounter (Signed)
If she is having significant diarrhea, please send Rx for Budesonide 75m daily X 30 days and Lomotil 1 tab q8h PRN

## 2017-04-20 ENCOUNTER — Other Ambulatory Visit: Payer: Self-pay

## 2017-04-20 ENCOUNTER — Telehealth (HOSPITAL_COMMUNITY): Payer: Self-pay

## 2017-04-20 DIAGNOSIS — K50118 Crohn's disease of large intestine with other complication: Secondary | ICD-10-CM

## 2017-04-20 NOTE — Telephone Encounter (Signed)
Patient contacted regarding discharge from [facility] on 04/19/17  Patient understands to follow up with provider -Yes.  Jonelle Sidle, PA on 05/03/17 at 9:00  Patient understands discharge instructions? Yes  Patient understands medications and regiment?  yes  Patient understands to bring all medications to this visit? yes  Pt is asking for refill of lorazepam.  I asked her to contact primary care for this.

## 2017-04-20 NOTE — Telephone Encounter (Signed)
Left message to call back  

## 2017-04-20 NOTE — Telephone Encounter (Signed)
Patients insurance is active and benefits verified through [employer] - No co-pay, deductible amount of $500.00/$121.87 has been met, out of pocket amount of $2,000/$1,161.87 has been met, 20% co-insurance, and no pre-authorization is required. Passport/reference [phone]  Patient will contacted and scheduled after their follow appt with the Cardiologist office upon review by the RN Navigator.

## 2017-04-28 ENCOUNTER — Encounter: Payer: Self-pay | Admitting: Gastroenterology

## 2017-04-28 ENCOUNTER — Ambulatory Visit: Payer: BLUE CROSS/BLUE SHIELD | Admitting: Gastroenterology

## 2017-04-28 ENCOUNTER — Other Ambulatory Visit (INDEPENDENT_AMBULATORY_CARE_PROVIDER_SITE_OTHER): Payer: BLUE CROSS/BLUE SHIELD

## 2017-04-28 VITALS — BP 120/72 | HR 65 | Ht 63.0 in | Wt 138.0 lb

## 2017-04-28 DIAGNOSIS — K50018 Crohn's disease of small intestine with other complication: Secondary | ICD-10-CM

## 2017-04-28 LAB — CBC WITH DIFFERENTIAL/PLATELET
BASOS ABS: 0.1 10*3/uL (ref 0.0–0.1)
Basophils Relative: 1.1 % (ref 0.0–3.0)
EOS ABS: 0.1 10*3/uL (ref 0.0–0.7)
EOS PCT: 1.7 % (ref 0.0–5.0)
HCT: 40.2 % (ref 36.0–46.0)
HEMOGLOBIN: 13.5 g/dL (ref 12.0–15.0)
LYMPHS ABS: 2.3 10*3/uL (ref 0.7–4.0)
Lymphocytes Relative: 32.2 % (ref 12.0–46.0)
MCHC: 33.5 g/dL (ref 30.0–36.0)
MCV: 93.2 fl (ref 78.0–100.0)
MONO ABS: 0.6 10*3/uL (ref 0.1–1.0)
Monocytes Relative: 7.7 % (ref 3.0–12.0)
NEUTROS PCT: 57.3 % (ref 43.0–77.0)
Neutro Abs: 4.1 10*3/uL (ref 1.4–7.7)
Platelets: 314 10*3/uL (ref 150.0–400.0)
RBC: 4.31 Mil/uL (ref 3.87–5.11)
RDW: 12.9 % (ref 11.5–15.5)
WBC: 7.2 10*3/uL (ref 4.0–10.5)

## 2017-04-28 LAB — FOLATE: Folate: 15.3 ng/mL (ref >5.9)

## 2017-04-28 LAB — COMPREHENSIVE METABOLIC PANEL
ALBUMIN: 4.3 g/dL (ref 3.5–5.2)
ALT: 24 U/L (ref 0–35)
AST: 18 U/L (ref 0–37)
Alkaline Phosphatase: 73 U/L (ref 39–117)
BILIRUBIN TOTAL: 0.3 mg/dL (ref 0.2–1.2)
BUN: 22 mg/dL (ref 6–23)
CALCIUM: 9.4 mg/dL (ref 8.4–10.5)
CHLORIDE: 103 meq/L (ref 96–112)
CO2: 28 mEq/L (ref 19–32)
CREATININE: 0.7 mg/dL (ref 0.40–1.20)
GFR: 91.07 mL/min (ref >60.00)
Glucose, Bld: 164 mg/dL — ABNORMAL HIGH (ref 70–99)
Potassium: 4.2 mEq/L (ref 3.5–5.1)
SODIUM: 139 meq/L (ref 135–145)
Total Protein: 6.6 g/dL (ref 6.0–8.3)

## 2017-04-28 LAB — IBC PANEL
Iron: 97 ug/dL (ref 42–145)
Saturation Ratios: 22 % (ref 20.0–50.0)
Transferrin: 315 mg/dL (ref 212.0–360.0)

## 2017-04-28 LAB — HIGH SENSITIVITY CRP: CRP, High Sensitivity: 1.56 mg/L (ref 0.000–5.000)

## 2017-04-28 LAB — SEDIMENTATION RATE: Sed Rate: 2 mm/hr (ref 0–30)

## 2017-04-28 LAB — VITAMIN B12: Vitamin B-12: 276 pg/mL (ref 211–911)

## 2017-04-28 LAB — FERRITIN: Ferritin: 48.2 ng/mL (ref 10.0–291.0)

## 2017-04-28 MED ORDER — COLESTIPOL HCL 1 G PO TABS: 1.0000 g | ORAL_TABLET | Freq: Two times a day (BID) | ORAL | 6 refills | Status: DC

## 2017-04-28 MED ORDER — BUDESONIDE 3 MG PO CPEP: 9.0000 mg | ORAL_CAPSULE | Freq: Every day | ORAL | 2 refills | Status: DC

## 2017-04-28 NOTE — Progress Notes (Signed)
[name]    [id number]    26-Apr-1961  Primary Care Physician:Hamrick, Lorin Mercy, MD  Referring Physician: Leonides Sake, MD [place], [place] [address]  Chief complaint: Diarrhea  HPI:  56 year old female with history of Crohn's disease of small bowel initially diagnosed in 1991 status post terminal ileum resection.  Most recent colonoscopy in January 2017 while on 6-MP did not show any active inflammation.  Patient was taken off 6-MP in June 2017 for persistent abnormality of transaminases which improved after 6-MP was stopped.  Subsequently patient had C. difficile colitis followed by flare of Crohn's disease which did not improve despite clearance of C. Difficile.  She was initiated on Humira.  Patient continued to have increased bowel frequency with 2-3 semi-formed to liquid bowel movements daily though slightly improved from prior.  Patient complained worsening of symptoms in the past couple months, checked Humira drug trough and antibody level noted to have undetectable drug level with significantly elevated antibodies.  Patient was advised to discontinue Humira.  She is started on budesonide 9 mg daily last week.  Reports improvement of diarrhea and is currently having 1-2 bowel movements.  Denies any nausea, vomiting or abdominal pain.  No blood in stool. Patient was recently hospitalized with NSTEMI and had PCI with stent placement.   Outpatient Encounter Medications as of 04/28/2017  Medication Sig  . acetaminophen (TYLENOL) 325 MG tablet Take 2 tablets (650 mg total) by mouth every 6 (six) hours as needed for headache.  . Ascorbic Acid (VITAMIN C PO) Take 1,000 mg daily by mouth.  Marland Kitchen aspirin 81 MG chewable tablet Chew 81 mg by mouth daily.    . budesonide (ENTOCORT EC) 3 MG 24 hr capsule Take 3 capsules (9 mg total) by mouth daily.  . clopidogrel (PLAVIX) 75 MG tablet Take 1 tablet (75 mg total) by mouth daily.  . cyclobenzaprine (FLEXERIL) 10  MG tablet Take 0.5-1 tablets (5-10 mg total) by mouth 3 (three) times daily as needed for muscle spasms.  . diphenoxylate-atropine (LOMOTIL) 2.5-0.025 MG tablet Take 1 tablet by mouth 3 (three) times daily. (Patient taking differently: Take 1 tablet 3 (three) times daily as needed by mouth for diarrhea or loose stools. )  . enalapril (VASOTEC) 5 MG tablet Take 1 tablet (5 mg total) by mouth daily.  Marland Kitchen EPIPEN 2-PAK 0.3 MG/0.3ML SOAJ injection Reported on 06/27/2015  . fenofibrate (TRICOR) 145 MG tablet Take 1 tablet (145 mg total) by mouth daily. Please make an appt with the Doctor before anymore refills. 1st attempt  . fexofenadine (ALLEGRA) 180 MG tablet Take 1 tablet (180 mg total) by mouth daily.  . metFORMIN (GLUCOPHAGE) 500 MG tablet Take 1 tablet (500 mg total) by mouth 2 (two) times daily with a meal.  . metoprolol succinate (TOPROL-XL) 50 MG 24 hr tablet Take 1 tablet (50 mg total) by mouth daily. Take with or immediately following a meal.  . pantoprazole (PROTONIX) 40 MG tablet Take 1 tablet (40 mg total) by mouth daily.  . pravastatin (PRAVACHOL) 10 MG tablet Take 1 tablet (10 mg total) by mouth daily at 6 PM.  . vitamin E 1000 UNIT capsule Take 1,000 Units daily by mouth.  . zolpidem (AMBIEN) 10 MG tablet Take 10 mg by mouth at bedtime as needed for sleep.   . [DISCONTINUED] budesonide (ENTOCORT EC) 3 MG 24 hr capsule Take 3 capsules (9 mg total) by mouth daily.  Marland Kitchen  colestipol (COLESTID) 1 g tablet Take 1 tablet (1 g total) by mouth 2 (two) times daily.  . [DISCONTINUED] HUMIRA PEN 40 MG/0.8ML PNKT Inject 40 mg into the skin every 14 (fourteen) days.  . [DISCONTINUED] levocetirizine (XYZAL) 5 MG tablet Take 5 mg by mouth every evening.  . [DISCONTINUED] LORazepam (ATIVAN) 0.5 MG tablet Take 2 tablets (1 mg total) by mouth every 6 (six) hours as needed (Dizziness). (Patient not taking: Reported on 04/16/2017)  . [DISCONTINUED] montelukast (SINGULAIR) 10 MG tablet Take 10 mg by mouth at  bedtime.   Facility-Administered Encounter Medications as of 04/28/2017  Medication  . nitroGLYCERIN (NITROSTAT) SL tablet 0.3 mg    Allergies as of 04/28/2017 - Review Complete 04/28/2017  Allergen Reaction Noted  . Lipitor [atorvastatin] Other (See Comments) 03/28/2013    Past Medical History:  Diagnosis Date  . Allergic rhinitis due to pollen   . Anxiety state, unspecified   . Arthritis   . Bronchitis    hx  . Calculus of gallbladder without mention of cholecystitis or obstruction   . Crohn disease ([place])   . Depressive disorder, not elsewhere classified   . Diabetes mellitus without complication (HCC)    diet controlled  . Esophageal reflux   . Fibromyalgia   . Headache   . History of nuclear stress test    ETT-Myoview 10/17: EF 55%, normal perfusion; Low Risk  . Myalgia and myositis, unspecified   . NSTEMI (non-ST elevated myocardial infarction) ([place]) 04/15/2017  . Other and unspecified hyperlipidemia   . Palpitations   . Pneumonia 15   hx  . S/P angioplasty with stent 04/18/17 with DES to pLAD 04/19/2017  . Unspecified essential hypertension   . Vitamin B deficiency     Past Surgical History:  Procedure Laterality Date  . CHOLECYSTECTOMY N/A 09/19/2014   Procedure: LAPAROSCOPIC CHOLECYSTECTOMY WITH INTRAOPERATIVE CHOLANGIOGRAM;  Surgeon: Donnie Mesa, MD;  Location: [facility];  Service: General;  Laterality: N/A;  . CORONARY STENT INTERVENTION N/A 04/18/2017   Procedure: CORONARY STENT INTERVENTION;  Surgeon: Martinique, Peter M, MD;  Location: [facility];  Service: Cardiovascular;  Laterality: N/A;  . ESOPHAGOGASTRODUODENOSCOPY    . EXPLORATORY LAPAROTOMY     for endometriosis  . INTRAVASCULAR PRESSURE WIRE/FFR STUDY N/A 04/18/2017   Procedure: INTRAVASCULAR PRESSURE WIRE/FFR STUDY;  Surgeon: Martinique, Peter M, MD;  Location: [facility];  Service: Cardiovascular;  Laterality: N/A;  . LEFT HEART CATH AND CORONARY ANGIOGRAPHY N/A 04/18/2017   Procedure:  LEFT HEART CATH AND CORONARY ANGIOGRAPHY;  Surgeon: Martinique, Peter M, MD;  Location: [facility];  Service: Cardiovascular;  Laterality: N/A;  . TONSILLECTOMY AND ADENOIDECTOMY    . TUBAL LIGATION     BILATERAL    Family History  Problem Relation Age of Onset  . Lung cancer Maternal Grandmother   . Stroke Father   . Hypertension Mother   . Heart attack Mother   . Heart disease Mother   . Hypertension Brother   . Heart disease Brother   . Hypertension Sister   . Heart disease Sister   . Coronary artery disease Brother   . Colon cancer Neg Hx   . Stomach cancer Neg Hx     Social History   Socioeconomic History  . Marital status: Married    Spouse name: Dane  . Number of children: 2  . Years of education: 12+  . Highest education level: Not on file  Social Needs  . Financial resource strain: Not  on file  . Food insecurity - worry: Not on file  . Food insecurity - inability: Not on file  . Transportation needs - medical: Not on file  . Transportation needs - non-medical: Not on file  Occupational History  . Occupation: MACHINE OPERATOR    Employer: [employer]  . Occupation: ACCOUNT REP.    Employer: [employer]  Tobacco Use  . Smoking status: Former Smoker    Packs/day: 1.00    Years: 35.00    Pack years: 35.00    Types: Cigarettes    Last attempt to quit: 05/31/2004    Years since quitting: 12.9  . Smokeless tobacco: Never Used  Substance and Sexual Activity  . Alcohol use: No  . Drug use: No  . Sexual activity: No    Comment: married  Other Topics Concern  . Not on file  Social History Narrative   Lives with her husband and their pets. Her children are adults and live independently.      Review of systems: Review of Systems  Constitutional: Negative for fever and chills. Positive for lack of energy HENT: Positive for sinus problems  Eyes: Negative for blurred vision.  Respiratory: Negative for cough, shortness of  breath and wheezing.   Cardiovascular: Negative for chest pain and palpitations.  Gastrointestinal: as per HPI Genitourinary: Negative for dysuria, urgency, frequency and hematuria.  Musculoskeletal: Negative for myalgias, back pain and joint pain.  Skin: Negative for itching and rash.  Neurological: Negative for dizziness, tremors, focal weakness, seizures and loss of consciousness.  Endo/Heme/Allergies: Positive for seasonal allergies.  Psychiatric/Behavioral: Negative for depression, suicidal ideas and hallucinations. Positive for anxiety All other systems reviewed and are negative.   Physical Exam: Vitals:   04/28/17 1051  BP: 120/72  Pulse: 65   Body mass index is 24.45 kg/m. Gen:      No acute distress HEENT:  EOMI, sclera anicteric Neck:     No masses; no thyromegaly Lungs:    Clear to auscultation bilaterally; normal respiratory effort CV:         Regular rate and rhythm; no murmurs Abd:      + bowel sounds; soft, non-tender; no palpable masses, no distension Ext:    No edema; adequate peripheral perfusion Skin:      Warm and dry; no rash Neuro: alert and oriented x 3 Psych: normal mood and affect  Data Reviewed:  Reviewed labs, radiology imaging, old records and pertinent past GI work up   Assessment and Plan/Recommendations:  57 year old female with history of Crohn's disease small bowel involvement diagnosed in 1991 status post terminal ileal resection with ileocolonic anastomosis Discontinued Humira due to elevated antibody level with undetectable drug trough Patient had reaction to Remicade in the past Discontinued 6-MP due to elevated transaminases.  She was in remission on 6-MP for a long period of time Currently symptoms improving with budesonide 9 mg daily, continue for 3 months followed by slow taper to 6 mg daily for 1 month and 3 mg daily for 1 month and stop Start Colestid 1 mg twice daily Follow-up CBC, CMP and CRP Follow-up in 3 months    K.  Denzil Magnuson , MD [phone] Mon-Fri 8a-5p [phone] after 5p, weekends, holidays  CC: Hamrick, Lorin Mercy, MD

## 2017-04-28 NOTE — Patient Instructions (Signed)
Go to the basement for labs today  Follow up in 3 months  Take Budesonide 9 mg for 3 months the decrease down to 6 mg for 1 month then 3 mg for 1 month then discontinue  Take Colestid 1 mg twice a day, we will send refills to your pharmacy

## 2017-05-02 NOTE — Progress Notes (Signed)
Cardiology Office Note    Date:  05/03/2017   ID:  [name], DOB [birth year], MRN [id number]  PCP:  Leonides Sake, MD  Cardiologist:  Dr. Aundra Dubin last seen in 2016  NOW Dr. Saunders Revel  Chief Complaint: Hospital follow up for NSTEMI  History of Present Illness:   [name] is a 56 y.o. female with HTN, HLP, DM, Crohn's disease of small bowel initially diagnosed in 1991 status post terminal ileum resection and recent NSTEMI presents for follow up.  Recently admitted for typical retrosternal chest pain. ECG shows new negative T waves in the lateral T waves. Troponin was elevated at 0.16-->0.15. Cath showed signal vessels obstructive CAD to pLAD with  iFr was 0.88 with FFR of .82. This is of borderline significance but given her clinical scenario with typical chest pain, Ecg changes, and T wave abnormality along with anterior wall motion abnormality  -> S/p DES. Did not tolerated Brillinta due to SOB--> changed to plavix. Increase BB to 26m due to elevated BP.She was not taking Crestor so placed pt on pravastatin but will need lipid panel done in 6-8 weeks.   Here today for follow up.  Walking without chest pain or angina.  No regular exercise.  2 days ago she woke up from sleep with chest discomfort.  Resolved by itself within 10-15 minutes.  She was not able to differentiate between typical GERD versus chest pain similar to when she went to the hospital.  No recurrence.  Compliant with medication.  Denies orthopnea, PND, syncope, lower extremity edema or melena.  Past Medical History:  Diagnosis Date  . Allergic rhinitis due to pollen   . Anxiety state, unspecified   . Arthritis   . Bronchitis    hx  . Calculus of gallbladder without mention of cholecystitis or obstruction   . Crohn disease ([place]   . Depressive disorder, not elsewhere classified   . Diabetes mellitus without complication (HCC)    diet controlled  . Esophageal reflux   . Fibromyalgia   . Headache   . History of  nuclear stress test    ETT-Myoview 10/17: EF 55%, normal perfusion; Low Risk  . Myalgia and myositis, unspecified   . NSTEMI (non-ST elevated myocardial infarction) ([place] 04/15/2017  . Other and unspecified hyperlipidemia   . Palpitations   . Pneumonia 15   hx  . S/P angioplasty with stent 04/18/17 with DES to pLAD 04/19/2017  . Unspecified essential hypertension   . Vitamin B deficiency     Past Surgical History:  Procedure Laterality Date  . CHOLECYSTECTOMY N/A 09/19/2014   Procedure: LAPAROSCOPIC CHOLECYSTECTOMY WITH INTRAOPERATIVE CHOLANGIOGRAM;  Surgeon: MDonnie Mesa MD;  [facility] [facility]  Service: General;  Laterality: N/A;  . CORONARY STENT INTERVENTION N/A 04/18/2017   Procedure: CORONARY STENT INTERVENTION;  Surgeon: JMartinique Peter M, MD;  [facility];  Service: Cardiovascular;  Laterality: N/A;  . ESOPHAGOGASTRODUODENOSCOPY    . EXPLORATORY LAPAROTOMY     for endometriosis  . INTRAVASCULAR PRESSURE WIRE/FFR STUDY N/A 04/18/2017   Procedure: INTRAVASCULAR PRESSURE WIRE/FFR STUDY;  Surgeon: JMartinique Peter M, MD;  [facility];  Service: Cardiovascular;  Laterality: N/A;  . LEFT HEART CATH AND CORONARY ANGIOGRAPHY N/A 04/18/2017   Procedure: LEFT HEART CATH AND CORONARY ANGIOGRAPHY;  Surgeon: JMartinique Peter M, MD;  [facility];  Service: Cardiovascular;  Laterality: N/A;  . TONSILLECTOMY AND ADENOIDECTOMY    . TUBAL LIGATION  BILATERAL    Current Medications:  Prior to Admission medications   Medication Sig Start Date End Date Taking? Authorizing Provider  aspirin 81 MG chewable tablet Chew 81 mg by mouth daily.     Yes [provider]  budesonide (ENTOCORT EC) 3 MG 24 hr capsule Take 3 capsules (9 mg total) by mouth daily. 04/28/17  Yes Mauri Pole, MD  clopidogrel (PLAVIX) 75 MG tablet Take 1 tablet (75 mg total) by mouth daily. 04/19/17  Yes Isaiah Serge, NP  colestipol (COLESTID) 1 g tablet Take 1  tablet (1 g total) by mouth 2 (two) times daily. 04/28/17  Yes Nandigam, Venia Minks, MD  diphenoxylate-atropine (LOMOTIL) 2.5-0.025 MG tablet Take 1 tablet by mouth 3 (three) times daily. Patient taking differently: Take 1 tablet 3 (three) times daily as needed by mouth for diarrhea or loose stools.  02/22/17  Yes Nandigam, Venia Minks, MD  DULoxetine (CYMBALTA) 60 MG capsule Take 60 mg by mouth daily.   Yes [provider]  enalapril (VASOTEC) 5 MG tablet Take 1 tablet (5 mg total) by mouth daily. 06/24/14  Yes Larey Dresser, MD  EPIPEN 2-PAK 0.3 MG/0.3ML SOAJ injection Reported on 06/27/2015 10/09/14  Yes [provider]  fenofibrate (TRICOR) 145 MG tablet Take 1 tablet (145 mg total) by mouth daily. Please make an appt with the Doctor before anymore refills. 1st attempt 03/29/17  Yes Larey Dresser, MD  glimepiride (AMARYL) 2 MG tablet Take 1 mg by mouth daily with breakfast.   Yes [provider]  metFORMIN (GLUCOPHAGE) 500 MG tablet Take 1 tablet (500 mg total) by mouth 2 (two) times daily with a meal. 04/21/17  Yes Isaiah Serge, NP  metoprolol succinate (TOPROL-XL) 50 MG 24 hr tablet Take 1 tablet (50 mg total) by mouth daily. Take with or immediately following a meal. 04/19/17  Yes Isaiah Serge, NP  montelukast (SINGULAIR) 10 MG tablet Take 10 mg by mouth every evening.   Yes [provider]  pantoprazole (PROTONIX) 40 MG tablet Take 1 tablet (40 mg total) by mouth daily. 04/19/17  Yes Isaiah Serge, NP  pravastatin (PRAVACHOL) 10 MG tablet Take 1 tablet (10 mg total) by mouth daily at 6 PM. 04/19/17  Yes Isaiah Serge, NP  zolpidem (AMBIEN) 10 MG tablet Take 10 mg by mouth at bedtime as needed for sleep.    Yes [provider]     Allergies:   Lipitor [atorvastatin]   Social History   Socioeconomic History  . Marital status: Married    Spouse name: Dane  . Number of children: 2  . Years of education: 12+  . Highest education  level: None  Social Needs  . Financial resource strain: None  . Food insecurity - worry: None  . Food insecurity - inability: None  . Transportation needs - medical: None  . Transportation needs - non-medical: None  Occupational History  . Occupation: MACHINE OPERATOR    Employer: [employer]  . Occupation: ACCOUNT REP.    Employer: [employer]  Tobacco Use  . Smoking status: Former Smoker    Packs/day: 1.00    Years: 35.00    Pack years: 35.00    Types: Cigarettes    Last attempt to quit: 05/31/2004    Years since quitting: 12.9  . Smokeless tobacco: Never Used  Substance and Sexual Activity  . Alcohol use: No  . Drug use: No  . Sexual activity: No  Comment: married  Other Topics Concern  . None  Social History Narrative   Lives with her husband and their pets. Her children are adults and live independently.     Family History:  The patient's family history includes Coronary artery disease in her brother; Heart attack in her mother; Heart disease in her brother, mother, and sister; Hypertension in her brother, mother, and sister; Lung cancer in her maternal grandmother; Stroke in her father.   ROS:   Please see the history of present illness.    ROS All other systems reviewed and are negative.   PHYSICAL EXAM:   VS:  BP 108/60   Pulse 69   Ht 5' 3"  (1.6 m)   Wt 138 lb (62.6 kg)   LMP  (LMP Unknown)   SpO2 96%   BMI 24.45 kg/m    GEN: Well nourished, well developed, in no acute distress  HEENT: normal  Neck: no JVD, carotid bruits, or masses Cardiac: RRR; no murmurs, rubs, or gallops,no edema  Respiratory:  clear to auscultation bilaterally, normal work of breathing GI: soft, nontender, nondistended, + BS MS: no deformity or atrophy  Skin: warm and dry, no rash Neuro:  Alert and Oriented x 3, Strength and sensation are intact Psych: euthymic mood, full affect  Wt Readings from Last 3 Encounters:  05/03/17 138 lb (62.6 kg)    04/28/17 138 lb (62.6 kg)  04/19/17 136 lb 11 oz (62 kg)      Studies/Labs Reviewed:   EKG:  EKG is not ordered today.    Recent Labs: 04/16/2017: Magnesium 1.8; TSH 3.272 04/28/2017: ALT 24; BUN 22; Creatinine, Ser 0.70; Hemoglobin 13.5; Platelets 314.0; Potassium 4.2; Sodium 139   Lipid Panel    Component Value Date/Time   CHOL 247 (H) 12/18/2015 1003   TRIG (H) 12/18/2015 1003    946.0 Triglyceride is over 400; calculations on Lipids are invalid.   HDL 34.10 (L) 12/18/2015 1003   CHOLHDL 7 12/18/2015 1003   VLDL 53.6 (H) 08/15/2014 0734   LDLCALC 127 08/27/2013   LDLDIRECT 85.0 12/18/2015 1003    Additional studies/ records that were reviewed today include:   04/18/17 Procedures   CORONARY STENT INTERVENTION  INTRAVASCULAR PRESSURE WIRE/FFR STUDY  LEFT HEART CATH AND CORONARY ANGIOGRAPHY  Conclusion     Prox LAD lesion is 65% stenosed.  A drug-eluting stent was successfully placed using a STENT PROMUS PREM MR 3.0X12.  Post intervention, there is a 0% residual stenosis.  The left ventricular systolic function is normal.  LV end diastolic pressure is normal.  The left ventricular ejection fraction is 50-55% by visual estimate.  1. Single vessel obstructive CAD. There is focal and somewhat hazy lesion in the proximal LAD. iFr was 0.88 with FFR of .82. This is of borderline significance but given her clinical scenario with typical chest pain, Ecg changes, and T wave abnormality along with anterior wall motion abnormality we decided to treat this lesion.  2. Overall good LV function 3. Normal LVEDP 4. Successful stenting of the proximal LAD with DES  Plan: DAPT for one year. Anticipate DC tomorrow.    Echo 04/19/17  Study Conclusions  - Left ventricle: The cavity size was normal. Wall thickness was normal. Systolic function was normal. The estimated ejection fraction was in the range of 55% to 60%. Mild hypokinesis of the apicalanteroseptal  myocardium. Doppler parameters are consistent with abnormal left ventricular relaxation (grade 1 diastolic dysfunction). - Pulmonary arteries: Systolic pressure was mildly increased. PA  peak pressure: 35 mm Hg (S).    ASSESSMENT & PLAN:    1. CAD s/p DES to LAD -Doing well except one episode of chest discomfort.  GI versus angina.  No recurrence.  Advised to try as needed nitro.  Continue aspirin, Plavix and statin.  Continue exercise.  2. HLD with hypertriglyceridemia  - On Pravastatin. LFT and lipid panel in 6 weeks.  3. HTN -Stable and well controlled on current regimen  4. DM -Per PCP Medication Adjustments/Labs and Tests Ordered: Current medicines are reviewed at length with the patient today.  Concerns regarding medicines are outlined above.  Medication changes, Labs and Tests ordered today are listed in the Patient Instructions below. Patient Instructions  Medication Instructions: Your physician recommends that you continue on your current medications as directed. Please refer to the Current Medication list given to you today.  Labwork: Your physician recommends that you return for lab work in 1 MONTH : FASTING Lipid and Hepatic Function Panel  Procedures/Testing: None Ordered  Follow-Up: Your physician wants you to follow-up in: 3 MONTHS with Dr. Saunders Revel   If you need a refill on your cardiac medications before your next appointment, please call your pharmacy.  [address]    [place], [place]  05/03/2017 9:29 AM    [facility] [place], [place], [place]  [address] Phone: [phone]; Fax: [phone]

## 2017-05-03 ENCOUNTER — Ambulatory Visit: Payer: BLUE CROSS/BLUE SHIELD | Admitting: Physician Assistant

## 2017-05-03 ENCOUNTER — Encounter: Payer: Self-pay | Admitting: Physician Assistant

## 2017-05-03 ENCOUNTER — Encounter (INDEPENDENT_AMBULATORY_CARE_PROVIDER_SITE_OTHER): Payer: Self-pay

## 2017-05-03 VITALS — BP 108/60 | HR 69 | Ht 63.0 in | Wt 138.0 lb

## 2017-05-03 DIAGNOSIS — E782 Mixed hyperlipidemia: Secondary | ICD-10-CM | POA: Diagnosis not present

## 2017-05-03 DIAGNOSIS — E119 Type 2 diabetes mellitus without complications: Secondary | ICD-10-CM | POA: Diagnosis not present

## 2017-05-03 DIAGNOSIS — I2510 Atherosclerotic heart disease of native coronary artery without angina pectoris: Secondary | ICD-10-CM

## 2017-05-03 DIAGNOSIS — I10 Essential (primary) hypertension: Secondary | ICD-10-CM | POA: Diagnosis not present

## 2017-05-03 MED ORDER — FENOFIBRATE 145 MG PO TABS: 145.0000 mg | ORAL_TABLET | Freq: Every day | ORAL | 1 refills | Status: DC

## 2017-05-03 MED ORDER — PANTOPRAZOLE SODIUM 40 MG PO TBEC: 40.0000 mg | DELAYED_RELEASE_TABLET | Freq: Every day | ORAL | 1 refills | Status: DC

## 2017-05-03 NOTE — Patient Instructions (Signed)
Medication Instructions: Your physician recommends that you continue on your current medications as directed. Please refer to the Current Medication list given to you today.  Labwork: Your physician recommends that you return for lab work in 1 MONTH : FASTING Lipid and Hepatic Function Panel  Procedures/Testing: None Ordered  Follow-Up: Your physician wants you to follow-up in: 3 MONTHS with Dr. Saunders Revel   If you need a refill on your cardiac medications before your next appointment, please call your pharmacy.  `

## 2017-05-05 ENCOUNTER — Telehealth (HOSPITAL_COMMUNITY): Payer: Self-pay

## 2017-05-05 NOTE — Telephone Encounter (Signed)
Attempted to call patient in regards to Cardiac Rehab - Vm is full

## 2017-05-11 ENCOUNTER — Telehealth: Payer: Self-pay | Admitting: Internal Medicine

## 2017-05-11 NOTE — Telephone Encounter (Signed)
I will defer this to [facility], PA, as I have never met the patient. However, based on her records, she does not need to limit activity with her arms from a post-catheterization standpoint. I would encourage her to avoid strenuous activity, including shoveling snow, until she has had a chance to take part in cardiac rehab.  Nelva Bush, MD [facility] Pager: [phone]

## 2017-05-11 NOTE — Telephone Encounter (Signed)
New Message   Pt had a heart attack near Thanksgiving and wants to know if its okay to move her arm to remove snow.

## 2017-05-11 NOTE — Telephone Encounter (Signed)
Pt had stent placed 04/18/17, post hospital appointment 05/03/17, pt states she has been doing fine.  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 She is asking if any limitations since recent stent placement.  Pt advised to try to limit time out side with cold temperatures, take regular breaks, avoid lifting more than 20 pounds, be reasonable with activities, stop any activity that causes chest pain/discomfort.  Pt advised I will forward to Dr End for review.

## 2017-05-12 NOTE — Telephone Encounter (Signed)
Mailbox full, unable to leave a message.

## 2017-05-13 NOTE — Telephone Encounter (Signed)
Agree with Dr. Darnelle Bos recommendation of avoid shoveling snow and strenuous activity until cardiac rehab. Take nitro PRN and as needed rest.

## 2017-05-13 NOTE — Telephone Encounter (Signed)
F/U Call:  Patient returning your call. [name] states that she left her phone at home yesterday but she has the phone today.

## 2017-05-13 NOTE — Telephone Encounter (Signed)
Discussed results and recommendations with patient, she verbalized understanding.

## 2017-05-17 ENCOUNTER — Encounter (HOSPITAL_COMMUNITY): Payer: Self-pay

## 2017-05-17 ENCOUNTER — Telehealth (HOSPITAL_COMMUNITY): Payer: Self-pay

## 2017-05-17 NOTE — Telephone Encounter (Signed)
2nd attempt to call patient in regards to Cardiac Rehab - Lm on Vm. Sending letter.

## 2017-05-26 ENCOUNTER — Telehealth (HOSPITAL_COMMUNITY): Payer: Self-pay

## 2017-05-26 NOTE — Telephone Encounter (Signed)
3rd attempt to call patient in regards to Cardiac Rehab - Lm on Vm

## 2017-06-02 ENCOUNTER — Telehealth (HOSPITAL_COMMUNITY): Payer: Self-pay

## 2017-06-02 ENCOUNTER — Other Ambulatory Visit: Payer: BLUE CROSS/BLUE SHIELD

## 2017-06-02 NOTE — Telephone Encounter (Signed)
No response from patient in regards to Cardiac Rehab - Closed referral.

## 2017-06-06 ENCOUNTER — Other Ambulatory Visit: Payer: BLUE CROSS/BLUE SHIELD

## 2017-06-07 ENCOUNTER — Ambulatory Visit: Payer: BLUE CROSS/BLUE SHIELD | Admitting: Physician Assistant

## 2017-07-04 DIAGNOSIS — M2550 Pain in unspecified joint: Secondary | ICD-10-CM | POA: Diagnosis not present

## 2017-07-04 DIAGNOSIS — M7989 Other specified soft tissue disorders: Secondary | ICD-10-CM | POA: Diagnosis not present

## 2017-07-04 DIAGNOSIS — R5383 Other fatigue: Secondary | ICD-10-CM | POA: Diagnosis not present

## 2017-07-04 DIAGNOSIS — R413 Other amnesia: Secondary | ICD-10-CM | POA: Diagnosis not present

## 2017-07-04 DIAGNOSIS — M797 Fibromyalgia: Secondary | ICD-10-CM | POA: Diagnosis not present

## 2017-07-05 ENCOUNTER — Telehealth (HOSPITAL_COMMUNITY): Payer: Self-pay | Admitting: Pharmacist

## 2017-07-06 ENCOUNTER — Telehealth (HOSPITAL_COMMUNITY): Payer: Self-pay

## 2017-07-06 NOTE — Telephone Encounter (Signed)
Patient called to cancel orientation and appts due to work conflicts. Closed referral.

## 2017-07-06 NOTE — Telephone Encounter (Signed)
Called patient to discuss medications prior to cardiac rehab appointment; however, patient no longer on the list for 07/14/2017. Pharmacy will follow up in the future when she has her next appointment.  Nida Boatman, PharmD PGY1 Acute Care Pharmacy Resident Pager: [phone]

## 2017-07-14 ENCOUNTER — Ambulatory Visit (HOSPITAL_COMMUNITY): Payer: BLUE CROSS/BLUE SHIELD

## 2017-07-20 ENCOUNTER — Ambulatory Visit (HOSPITAL_COMMUNITY): Payer: BLUE CROSS/BLUE SHIELD

## 2017-07-22 ENCOUNTER — Ambulatory Visit (HOSPITAL_COMMUNITY): Payer: BLUE CROSS/BLUE SHIELD

## 2017-07-25 NOTE — Progress Notes (Deleted)
Cardiology Office Note    Date:  07/25/2017   ID:  [name], DOB 1961-03-10, MRN [id number]  PCP:  Leonides Sake, MD  Cardiologist: Dr. Saunders Revel  Chief Complaint: 3  Months follow up  History of Present Illness:   [name] is a 57 y.o. female  with hx of CAD HTN, HLP, DM, Crohn's disease of small bowel initially diagnosed in 1991 status post terminal ileum resection  presents for follow up.   Admitted 03/2017 for NSTEMI. Cath showed signal vessels obstructive CAD to pLAD with iFr was 0.88 with FFR of .82. This is of borderline significance but given her clinical scenario with typical chest pain, Ecg changes, and T wave abnormality along with anterior wall motion abnormality  -> S/p DES. Did not tolerated Brillinta due to SOB--> changed to plavix.   She was doing well on cardiac stand point when last seen be me 05/03/17.   *** due for lipids Past Medical History:  Diagnosis Date  . Allergic rhinitis due to pollen   . Anxiety state, unspecified   . Arthritis   . Bronchitis    hx  . Calculus of gallbladder without mention of cholecystitis or obstruction   . Crohn disease ([place])   . Depressive disorder, not elsewhere classified   . Diabetes mellitus without complication (HCC)    diet controlled  . Esophageal reflux   . Fibromyalgia   . Headache   . History of nuclear stress test    ETT-Myoview 10/17: EF 55%, normal perfusion; Low Risk  . Myalgia and myositis, unspecified   . NSTEMI (non-ST elevated myocardial infarction) ([place]) 04/15/2017  . Other and unspecified hyperlipidemia   . Palpitations   . Pneumonia 15   hx  . S/P angioplasty with stent 04/18/17 with DES to pLAD 04/19/2017  . Unspecified essential hypertension   . Vitamin B deficiency     Past Surgical History:  Procedure Laterality Date  . CHOLECYSTECTOMY N/A 09/19/2014   Procedure: LAPAROSCOPIC CHOLECYSTECTOMY WITH INTRAOPERATIVE CHOLANGIOGRAM;  Surgeon: Donnie Mesa, MD;  Location: [facility];  Service:  General;  Laterality: N/A;  . CORONARY STENT INTERVENTION N/A 04/18/2017   Procedure: CORONARY STENT INTERVENTION;  Surgeon: Martinique, Peter M, MD;  Location: [facility];  Service: Cardiovascular;  Laterality: N/A;  . ESOPHAGOGASTRODUODENOSCOPY    . EXPLORATORY LAPAROTOMY     for endometriosis  . INTRAVASCULAR PRESSURE WIRE/FFR STUDY N/A 04/18/2017   Procedure: INTRAVASCULAR PRESSURE WIRE/FFR STUDY;  Surgeon: Martinique, Peter M, MD;  Location: [facility];  Service: Cardiovascular;  Laterality: N/A;  . LEFT HEART CATH AND CORONARY ANGIOGRAPHY N/A 04/18/2017   Procedure: LEFT HEART CATH AND CORONARY ANGIOGRAPHY;  Surgeon: Martinique, Peter M, MD;  Location: [facility];  Service: Cardiovascular;  Laterality: N/A;  . TONSILLECTOMY AND ADENOIDECTOMY    . TUBAL LIGATION     BILATERAL    Current Medications: Prior to Admission medications   Medication Sig Start Date End Date Taking? Authorizing Provider  aspirin 81 MG chewable tablet Chew 81 mg by mouth daily.      [provider]  budesonide (ENTOCORT EC) 3 MG 24 hr capsule Take 3 capsules (9 mg total) by mouth daily. 04/28/17   Mauri Pole, MD  clopidogrel (PLAVIX) 75 MG tablet Take 1 tablet (75 mg total) by mouth daily. 04/19/17   Isaiah Serge, NP  colestipol (COLESTID) 1 g tablet Take 1 tablet (1 g total) by mouth 2 (two) times daily.  04/28/17   Mauri Pole, MD  diphenoxylate-atropine (LOMOTIL) 2.5-0.025 MG tablet Take 1 tablet by mouth 3 (three) times daily. Patient taking differently: Take 1 tablet 3 (three) times daily as needed by mouth for diarrhea or loose stools.  02/22/17   Mauri Pole, MD  DULoxetine (CYMBALTA) 60 MG capsule Take 60 mg by mouth daily.    [provider]  enalapril (VASOTEC) 5 MG tablet Take 1 tablet (5 mg total) by mouth daily. 06/24/14   Larey Dresser, MD  EPIPEN 2-PAK 0.3 MG/0.3ML SOAJ injection Reported on 06/27/2015 10/09/14   [provider]    fenofibrate (TRICOR) 145 MG tablet Take 1 tablet (145 mg total) by mouth daily. 05/03/17   Braxxton Stoudt, Crista Luria, PA  glimepiride (AMARYL) 2 MG tablet Take 1 mg by mouth daily with breakfast.    [provider]  metFORMIN (GLUCOPHAGE) 500 MG tablet Take 1 tablet (500 mg total) by mouth 2 (two) times daily with a meal. 04/21/17   Isaiah Serge, NP  metoprolol succinate (TOPROL-XL) 50 MG 24 hr tablet Take 1 tablet (50 mg total) by mouth daily. Take with or immediately following a meal. 04/19/17   Isaiah Serge, NP  montelukast (SINGULAIR) 10 MG tablet Take 10 mg by mouth every evening.    [provider]  pantoprazole (PROTONIX) 40 MG tablet Take 1 tablet (40 mg total) by mouth daily. 05/03/17   Leanor Kail, PA  pravastatin (PRAVACHOL) 10 MG tablet Take 1 tablet (10 mg total) by mouth daily at 6 PM. 04/19/17   Isaiah Serge, NP  zolpidem (AMBIEN) 10 MG tablet Take 10 mg by mouth at bedtime as needed for sleep.     [provider]    Allergies:   Lipitor [atorvastatin]   Social History   Socioeconomic History  . Marital status: Married    Spouse name: Dane  . Number of children: 2  . Years of education: 12+  . Highest education level: Not on file  Social Needs  . Financial resource strain: Not on file  . Food insecurity - worry: Not on file  . Food insecurity - inability: Not on file  . Transportation needs - medical: Not on file  . Transportation needs - non-medical: Not on file  Occupational History  . Occupation: MACHINE OPERATOR    Employer: [employer]  . Occupation: ACCOUNT REP.    Employer: [employer]  Tobacco Use  . Smoking status: Former Smoker    Packs/day: 1.00    Years: 35.00    Pack years: 35.00    Types: Cigarettes    Last attempt to quit: 05/31/2004    Years since quitting: 13.1  . Smokeless tobacco: Never Used  Substance and Sexual Activity  . Alcohol use: No  . Drug use: No  . Sexual  activity: No    Comment: married  Other Topics Concern  . Not on file  Social History Narrative   Lives with her husband and their pets. Her children are adults and live independently.     Family History:  The patient's family history includes Coronary artery disease in her brother; Heart attack in her mother; Heart disease in her brother, mother, and sister; Hypertension in her brother, mother, and sister; Lung cancer in her maternal grandmother; Stroke in her father. ***  ROS:   Please see the history of present illness.    ROS All other systems reviewed and are negative.   PHYSICAL EXAM:  VS:  LMP  (LMP Unknown)    GEN: Well nourished, well developed, in no acute distress  HEENT: normal  Neck: no JVD, carotid bruits, or masses Cardiac: ***RRR; no murmurs, rubs, or gallops,no edema  Respiratory:  clear to auscultation bilaterally, normal work of breathing GI: soft, nontender, nondistended, + BS MS: no deformity or atrophy  Skin: warm and dry, no rash Neuro:  Alert and Oriented x 3, Strength and sensation are intact Psych: euthymic mood, full affect  Wt Readings from Last 3 Encounters:  05/03/17 138 lb (62.6 kg)  04/28/17 138 lb (62.6 kg)  04/19/17 136 lb 11 oz (62 kg)      Studies/Labs Reviewed:   EKG:  EKG is ordered today.  The ekg ordered today demonstrates ***  Recent Labs: 04/16/2017: Magnesium 1.8; TSH 3.272 04/28/2017: ALT 24; BUN 22; Creatinine, Ser 0.70; Hemoglobin 13.5; Platelets 314.0; Potassium 4.2; Sodium 139   Lipid Panel    Component Value Date/Time   CHOL 247 (H) 12/18/2015 1003   TRIG (H) 12/18/2015 1003    946.0 Triglyceride is over 400; calculations on Lipids are invalid.   HDL 34.10 (L) 12/18/2015 1003   CHOLHDL 7 12/18/2015 1003   VLDL 53.6 (H) 08/15/2014 0734   LDLCALC 127 08/27/2013   LDLDIRECT 85.0 12/18/2015 1003    Additional studies/ records that were reviewed today include:   04/18/17 Procedures   CORONARY STENT INTERVENTION   INTRAVASCULAR PRESSURE WIRE/FFR STUDY  LEFT HEART CATH AND CORONARY ANGIOGRAPHY  Conclusion     Prox LAD lesion is 65% stenosed.  A drug-eluting stent was successfully placed using a STENT PROMUS PREM MR 3.0X12.  Post intervention, there is a 0% residual stenosis.  The left ventricular systolic function is normal.  LV end diastolic pressure is normal.  The left ventricular ejection fraction is 50-55% by visual estimate.  1. Single vessel obstructive CAD. There is focal and somewhat hazy lesion in the proximal LAD. iFr was 0.88 with FFR of .82. This is of borderline significance but given her clinical scenario with typical chest pain, Ecg changes, and T wave abnormality along with anterior wall motion abnormality we decided to treat this lesion.  2. Overall good LV function 3. Normal LVEDP 4. Successful stenting of the proximal LAD with DES  Plan: DAPT for one year. Anticipate DC tomorrow.   Echo 04/19/17 Study Conclusions  - Left ventricle: The cavity size was normal. Wall thickness was normal. Systolic function was normal. The estimated ejection fraction was in the range of 55% to 60%. Mild hypokinesis of the apicalanteroseptal myocardium. Doppler parameters are consistent with abnormal left ventricular relaxation (grade 1 diastolic dysfunction). - Pulmonary arteries: Systolic pressure was mildly increased. PA peak pressure: 35 mm Hg (S).   ASSESSMENT & PLAN:     1. CAD s/p DES to LAD -Doing well except one episode of chest discomfort.  GI versus angina.  No recurrence.  Advised to try as needed nitro.  Continue aspirin, Plavix and statin.  Continue exercise.  2. HLD with hypertriglyceridemia  - On Pravastatin.  He is due for LFT and lipid panel. ***  3. HTN -Stable and well controlled on current regimen  4. DM -Per PCP    Medication Adjustments/Labs and Tests Ordered: Current medicines are reviewed at length with the patient today.   Concerns regarding medicines are outlined above.  Medication changes, Labs and Tests ordered today are listed in the Patient Instructions below. There are no Patient Instructions on file for this visit.  Jarrett Soho, [place]  07/25/2017 1:22 PM    [facility] [address] [place], [place], [place]  [address] Phone: [phone]; Fax: [phone]

## 2017-07-26 ENCOUNTER — Telehealth: Payer: Self-pay | Admitting: Physician Assistant

## 2017-07-26 NOTE — Telephone Encounter (Signed)
New Message:    Pt said she had stents put in [place] wants to know if it is alright for to fly?

## 2017-07-26 NOTE — Telephone Encounter (Signed)
Spoke with patient in regards to question about stent placement and flying.. Stent placed back in November and I informed her was okay to go ahead..  She verbalized understanding.Marland Kitchen

## 2017-07-27 ENCOUNTER — Ambulatory Visit (HOSPITAL_COMMUNITY): Payer: BLUE CROSS/BLUE SHIELD

## 2017-07-27 ENCOUNTER — Ambulatory Visit: Payer: BLUE CROSS/BLUE SHIELD | Admitting: Physician Assistant

## 2017-07-29 ENCOUNTER — Ambulatory Visit (HOSPITAL_COMMUNITY): Payer: BLUE CROSS/BLUE SHIELD

## 2017-07-30 DIAGNOSIS — J205 Acute bronchitis due to respiratory syncytial virus: Secondary | ICD-10-CM | POA: Diagnosis not present

## 2017-08-01 ENCOUNTER — Ambulatory Visit: Payer: BLUE CROSS/BLUE SHIELD | Admitting: Physician Assistant

## 2017-08-03 ENCOUNTER — Ambulatory Visit (HOSPITAL_COMMUNITY): Payer: BLUE CROSS/BLUE SHIELD

## 2017-08-04 ENCOUNTER — Ambulatory Visit: Payer: BLUE CROSS/BLUE SHIELD | Admitting: Internal Medicine

## 2017-08-05 ENCOUNTER — Ambulatory Visit (HOSPITAL_COMMUNITY): Payer: BLUE CROSS/BLUE SHIELD

## 2017-08-10 ENCOUNTER — Ambulatory Visit (HOSPITAL_COMMUNITY): Payer: BLUE CROSS/BLUE SHIELD

## 2017-08-12 ENCOUNTER — Ambulatory Visit (HOSPITAL_COMMUNITY): Payer: BLUE CROSS/BLUE SHIELD

## 2017-08-17 ENCOUNTER — Ambulatory Visit (HOSPITAL_COMMUNITY): Payer: BLUE CROSS/BLUE SHIELD

## 2017-08-19 ENCOUNTER — Ambulatory Visit (HOSPITAL_COMMUNITY): Payer: BLUE CROSS/BLUE SHIELD

## 2017-08-22 ENCOUNTER — Other Ambulatory Visit: Payer: Self-pay | Admitting: Gastroenterology

## 2017-08-22 ENCOUNTER — Telehealth: Payer: Self-pay | Admitting: Gastroenterology

## 2017-08-22 NOTE — Telephone Encounter (Signed)
She received a letter from [facility] asking questions about her MI. She was wondering if there was a known connection or a suspected connection with heart issues and Humira.

## 2017-08-24 ENCOUNTER — Ambulatory Visit (HOSPITAL_COMMUNITY): Payer: BLUE CROSS/BLUE SHIELD

## 2017-08-24 NOTE — Telephone Encounter (Signed)
Discussed with the patient. She says she is going to give Humira / Doy Mince permission to request records pertaining to Humira.

## 2017-08-24 NOTE — Telephone Encounter (Signed)
The risk for MI or cardiovascular disease is listed as <5% associated with Humira. It is very unlikely to be secondary to Humira

## 2017-08-26 ENCOUNTER — Ambulatory Visit (HOSPITAL_COMMUNITY): Payer: BLUE CROSS/BLUE SHIELD

## 2017-08-31 ENCOUNTER — Ambulatory Visit (HOSPITAL_COMMUNITY): Payer: BLUE CROSS/BLUE SHIELD

## 2017-09-02 ENCOUNTER — Ambulatory Visit (HOSPITAL_COMMUNITY): Payer: BLUE CROSS/BLUE SHIELD

## 2017-09-07 ENCOUNTER — Ambulatory Visit (HOSPITAL_COMMUNITY): Payer: BLUE CROSS/BLUE SHIELD

## 2017-09-09 ENCOUNTER — Ambulatory Visit (HOSPITAL_COMMUNITY): Payer: BLUE CROSS/BLUE SHIELD

## 2017-09-14 ENCOUNTER — Ambulatory Visit (HOSPITAL_COMMUNITY): Payer: BLUE CROSS/BLUE SHIELD

## 2017-09-16 ENCOUNTER — Telehealth: Payer: Self-pay | Admitting: *Deleted

## 2017-09-16 ENCOUNTER — Ambulatory Visit: Payer: BLUE CROSS/BLUE SHIELD | Admitting: Internal Medicine

## 2017-09-16 ENCOUNTER — Encounter: Payer: Self-pay | Admitting: Internal Medicine

## 2017-09-16 ENCOUNTER — Ambulatory Visit (HOSPITAL_COMMUNITY): Payer: BLUE CROSS/BLUE SHIELD

## 2017-09-16 VITALS — BP 110/68 | HR 75 | Ht 63.0 in | Wt 142.8 lb

## 2017-09-16 DIAGNOSIS — R5383 Other fatigue: Secondary | ICD-10-CM

## 2017-09-16 DIAGNOSIS — E1159 Type 2 diabetes mellitus with other circulatory complications: Secondary | ICD-10-CM | POA: Diagnosis not present

## 2017-09-16 DIAGNOSIS — E782 Mixed hyperlipidemia: Secondary | ICD-10-CM | POA: Diagnosis not present

## 2017-09-16 DIAGNOSIS — I10 Essential (primary) hypertension: Secondary | ICD-10-CM | POA: Diagnosis not present

## 2017-09-16 DIAGNOSIS — G4733 Obstructive sleep apnea (adult) (pediatric): Secondary | ICD-10-CM

## 2017-09-16 DIAGNOSIS — I2510 Atherosclerotic heart disease of native coronary artery without angina pectoris: Secondary | ICD-10-CM | POA: Diagnosis not present

## 2017-09-16 NOTE — Patient Instructions (Signed)
Medication Instructions:  Your physician recommends that you continue on your current medications as directed. Please refer to the Current Medication list given to you today.  -- If you need a refill on your cardiac medications before your next appointment, please call your pharmacy. --  Labwork: None ordered  Testing/Procedures:  Sleep studie   Follow-Up: Your physician wants you to follow-up in: 4 months with Dr. Saunders Revel.     Thank you for choosing [facility]!!    Any Other Special Instructions Will Be Listed Below (If Applicable).

## 2017-09-16 NOTE — Telephone Encounter (Signed)
-----   Message from Frederik [name], RN sent at 09/16/2017  9:26 AM EDT ----- Regarding: sleep study Sleep Study order placed Thank you

## 2017-09-16 NOTE — Progress Notes (Signed)
Follow-up Outpatient Visit Date: 09/16/2017  Primary Care Provider: Leonides Sake, MD [address] [place] [address]  Chief Complaint: Follow-up coronary artery disease  HPI:  [name] is a 57 y.o. year-old female with history of coronary artery disease with an STEMI (03/2018), hypertension, hyperlipidemia, diabetes mellitus, and Crohn's disease, who presents for follow-up of coronary artery disease.  She was admitted with typical chest pain and mild troponin elevation in November.  Catheterization showed 60-70% proximal LAD lesion that was borderline by FFR.  Given typical symptoms, decision was made to proceed with drug-eluting stent placement.  She subsequently developed shortness of breath on ticagrelor and was transitioned to clopidogrel.  At her last visit with Robbie Lis, PA, in early December, she was doing well with the exception of a single episode of chest pain that woke her up from sleep and resolved on its own within 10-15 minutes.  Today, [name]'s only complaint is of fatigue.  She feels like it has gotten worse over the last 4-6 months.  She does not sleep well at night and is not well rested when she awakens in the morning.  She also has considerable daytime somnolence.  She notes snoring quite a bit.  She states that she underwent a sleep study many years ago and was told that she had mild sleep apnea.  She has not used CPAP or been reevaluated since then.  [name] denies any chest pain shortness of breath, palpitations, lightheadedness, and edema.  She is tolerating her medications well.  She believes that labs were drawn by her PCP since her last visit with [place].  --------------------------------------------------------------------------------------------------  Past Medical History:  Diagnosis Date  . Allergic rhinitis due to pollen   . Anxiety state, unspecified   . Arthritis   . Bronchitis    hx  . Calculus of gallbladder without mention of  cholecystitis or obstruction   . Crohn disease ([place])   . Depressive disorder, not elsewhere classified   . Diabetes mellitus without complication (HCC)    diet controlled  . Esophageal reflux   . Fibromyalgia   . Headache   . History of nuclear stress test    ETT-Myoview 10/17: EF 55%, normal perfusion; Low Risk  . Myalgia and myositis, unspecified   . NSTEMI (non-ST elevated myocardial infarction) ([place]) 04/15/2017  . Other and unspecified hyperlipidemia   . Palpitations   . Pneumonia 15   hx  . S/P angioplasty with stent 04/18/17 with DES to pLAD 04/19/2017  . Unspecified essential hypertension   . Vitamin B deficiency    Past Surgical History:  Procedure Laterality Date  . CHOLECYSTECTOMY N/A 09/19/2014   Procedure: LAPAROSCOPIC CHOLECYSTECTOMY WITH INTRAOPERATIVE CHOLANGIOGRAM;  Surgeon: Donnie Mesa, MD;  Location: [facility];  Service: General;  Laterality: N/A;  . CORONARY STENT INTERVENTION N/A 04/18/2017   Procedure: CORONARY STENT INTERVENTION;  Surgeon: Martinique, Peter M, MD;  Location: [facility];  Service: Cardiovascular;  Laterality: N/A;  . ESOPHAGOGASTRODUODENOSCOPY    . EXPLORATORY LAPAROTOMY     for endometriosis  . INTRAVASCULAR PRESSURE WIRE/FFR STUDY N/A 04/18/2017   Procedure: INTRAVASCULAR PRESSURE WIRE/FFR STUDY;  Surgeon: Martinique, Peter M, MD;  Location: [facility];  Service: Cardiovascular;  Laterality: N/A;  . LEFT HEART CATH AND CORONARY ANGIOGRAPHY N/A 04/18/2017   Procedure: LEFT HEART CATH AND CORONARY ANGIOGRAPHY;  Surgeon: Martinique, Peter M, MD;  Location: [facility];  Service: Cardiovascular;  Laterality: N/A;  . TONSILLECTOMY AND  ADENOIDECTOMY    . TUBAL LIGATION     BILATERAL    Current Meds  Medication Sig  . aspirin 81 MG chewable tablet Chew 81 mg by mouth daily.    . budesonide (ENTOCORT EC) 3 MG 24 hr capsule TAKE 3 CAPSULES BY MOUTH DAILY  . clopidogrel (PLAVIX) 75 MG tablet Take 1 tablet (75 mg total) by mouth daily.    . colestipol (COLESTID) 1 g tablet Take 1 tablet (1 g total) by mouth 2 (two) times daily.  . diphenoxylate-atropine (LOMOTIL) 2.5-0.025 MG tablet Take 1 tablet by mouth 3 (three) times daily as needed for diarrhea or loose stools.  . DULoxetine (CYMBALTA) 60 MG capsule Take 60 mg by mouth daily.  . enalapril (VASOTEC) 5 MG tablet Take 1 tablet (5 mg total) by mouth daily.  Marland Kitchen EPIPEN 2-PAK 0.3 MG/0.3ML SOAJ injection Reported on 06/27/2015  . fenofibrate (TRICOR) 145 MG tablet Take 1 tablet (145 mg total) by mouth daily.  Marland Kitchen glimepiride (AMARYL) 2 MG tablet Take 1 mg by mouth daily with breakfast.  . metFORMIN (GLUCOPHAGE) 500 MG tablet Take 1 tablet (500 mg total) by mouth 2 (two) times daily with a meal.  . metoprolol succinate (TOPROL-XL) 50 MG 24 hr tablet Take 1 tablet (50 mg total) by mouth daily. Take with or immediately following a meal.  . montelukast (SINGULAIR) 10 MG tablet Take 10 mg by mouth every evening.  . pantoprazole (PROTONIX) 40 MG tablet Take 1 tablet (40 mg total) by mouth daily.  . pravastatin (PRAVACHOL) 10 MG tablet Take 1 tablet (10 mg total) by mouth daily at 6 PM.  . zolpidem (AMBIEN) 10 MG tablet Take 10 mg by mouth at bedtime as needed for sleep.    Current Facility-Administered Medications for the 09/16/17 encounter (Office Visit) with Shamecka Hocutt, Harrell Gave, MD  Medication  . nitroGLYCERIN (NITROSTAT) SL tablet 0.3 mg    Allergies: Lipitor [atorvastatin]  Social History   Tobacco Use  . Smoking status: Former Smoker    Packs/day: 1.00    Years: 35.00    Pack years: 35.00    Types: Cigarettes    Last attempt to quit: 05/31/2004    Years since quitting: 13.3  . Smokeless tobacco: Never Used  Substance Use Topics  . Alcohol use: No  . Drug use: No    Family History  Problem Relation Age of Onset  . Lung cancer Maternal Grandmother   . Stroke Father   . Hypertension Mother   . Heart attack Mother   . Heart disease Mother   . Hypertension Brother   .  Heart disease Brother   . Hypertension Sister   . Heart disease Sister   . Coronary artery disease Brother   . Colon cancer Neg Hx   . Stomach cancer Neg Hx     Review of Systems: A 12-system review of systems was performed and was negative except as noted in the HPI.  --------------------------------------------------------------------------------------------------  Physical Exam: BP 110/68   Pulse 75   Ht 5' 3"  (1.6 m)   Wt 142 lb 12.8 oz (64.8 kg)   LMP  (LMP Unknown)   BMI 25.30 kg/m   General: NAD. HEENT: No conjunctival pallor or scleral icterus. Moist mucous membranes.  OP clear. Neck: Supple without lymphadenopathy, thyromegaly, JVD, or HJR. Lungs: Normal work of breathing. Clear to auscultation bilaterally without wheezes or crackles. Heart: Regular rate and rhythm without murmurs, rubs, or gallops. Non-displaced PMI. Abd: Bowel sounds present. Soft, NT/ND without hepatosplenomegaly  Ext: No lower extremity edema. Radial, PT, and DP pulses are 2+ bilaterally. Skin: Warm and dry without rash.  EKG: Normal sinus rhythm with borderline QT prolongation.  Otherwise, no significant abnormalities.  Lab Results  Component Value Date   WBC 7.2 04/28/2017   HGB 13.5 04/28/2017   HCT 40.2 04/28/2017   MCV 93.2 04/28/2017   PLT 314.0 04/28/2017    Lab Results  Component Value Date   NA 139 04/28/2017   K 4.2 04/28/2017   CL 103 04/28/2017   CO2 28 04/28/2017   BUN 22 04/28/2017   CREATININE 0.70 04/28/2017   GLUCOSE 164 (H) 04/28/2017   ALT 24 04/28/2017    Lab Results  Component Value Date   CHOL 247 (H) 12/18/2015   HDL 34.10 (L) 12/18/2015   LDLCALC 127 08/27/2013   LDLDIRECT 85.0 12/18/2015   TRIG (H) 12/18/2015    946.0 Triglyceride is over 400; calculations on Lipids are invalid.   CHOLHDL 7 12/18/2015    --------------------------------------------------------------------------------------------------  ASSESSMENT AND PLAN: Coronary artery disease  without angina [name] has recovered well from her NSTEMI last year.  We will continue with aggressive secondary prevention including at least 12 months of dual antiplatelet therapy with aspirin and clopidogrel.  Hyperlipidemia [name] has been intolerant of atorvastatin.  She is currently on pravastatin and fenofibrate.  She believes that a lipid panel has been drawn by her PCP since her MI in November.  We will request these records.  We will need to consider a trial of rosuvastatin if her LDL is not at goal.  She may also benefit from addition of Vecepa, given hypertriglyceridemia and CAD.   Essential hypertension Blood pressure adequately controlled.  No medication changes at this time.  Obstructive sleep apnea and fatigue Patient reports mild OSA diagnosed many years ago.  I am concerned that her fatigue and snoring may reflect progression of this.  We will refer her for a sleep study.  Type 2 diabetes mellitus Continue medical therapy and close follow-up with Dr. Lisbeth Ply.  Follow-up: Return to clinic in 4 months.  Nelva Bush, MD 09/16/2017 9:03 AM

## 2017-09-17 ENCOUNTER — Encounter: Payer: Self-pay | Admitting: Internal Medicine

## 2017-09-17 DIAGNOSIS — G4733 Obstructive sleep apnea (adult) (pediatric): Secondary | ICD-10-CM | POA: Insufficient documentation

## 2017-09-17 DIAGNOSIS — E118 Type 2 diabetes mellitus with unspecified complications: Secondary | ICD-10-CM | POA: Insufficient documentation

## 2017-09-17 DIAGNOSIS — E119 Type 2 diabetes mellitus without complications: Secondary | ICD-10-CM | POA: Insufficient documentation

## 2017-09-19 ENCOUNTER — Telehealth: Payer: Self-pay | Admitting: Internal Medicine

## 2017-09-19 NOTE — Telephone Encounter (Signed)
Records received From [facility]. Gave to Legrand Como.

## 2017-09-20 ENCOUNTER — Telehealth: Payer: Self-pay | Admitting: *Deleted

## 2017-09-20 NOTE — Telephone Encounter (Signed)
Staff message sent to [place]. Per Louann @ [employer] no  PA required. Ok to schedule study.

## 2017-09-21 ENCOUNTER — Ambulatory Visit (HOSPITAL_COMMUNITY): Payer: BLUE CROSS/BLUE SHIELD

## 2017-09-21 NOTE — Telephone Encounter (Signed)
Patient is scheduled for lab study on 10/12/17. Patient understands her sleep study will be done at [facility] sleep lab. Patient understands she will receive a sleep packet in a week or so. Patient understands to call if she does not receive the sleep packet in a timely manner.  Left detailed message on voicemail with date and time of sleep study and informed patient to call back to confirm or reschedule.

## 2017-09-21 NOTE — Telephone Encounter (Signed)
RE: sleep study  Received: [place], CMA  Freada Bergeron, CMA        Per Marshall Cork @ [employer] No PA needed. Ok to scheduled study.   Previous Messages

## 2017-09-22 ENCOUNTER — Telehealth: Payer: Self-pay | Admitting: Internal Medicine

## 2017-09-22 DIAGNOSIS — E782 Mixed hyperlipidemia: Secondary | ICD-10-CM

## 2017-09-22 MED ORDER — ROSUVASTATIN CALCIUM 10 MG PO TABS: 10.0000 mg | ORAL_TABLET | Freq: Every day | ORAL | 3 refills | Status: DC

## 2017-09-22 NOTE — Telephone Encounter (Signed)
New message    Pt c/o medication issue:  1. Name of Medication: cholesterol medication  2. How are you currently taking this medication (dosage and times per day)? n/a  3. Are you having a reaction (difficulty breathing--STAT)? no  4. What is your medication issue? Patient states Dr End wanted to start her on a new cholesterol med, she does not know the name, no new med at pharmacy

## 2017-09-22 NOTE — Telephone Encounter (Signed)
Thanks for the update.  Let's have [name] get a repeat lipid panel and an ALT in 6 weeks.  Thanks.  Nelva Bush, MD [facility] Pager: [phone]

## 2017-09-22 NOTE — Telephone Encounter (Signed)
Notes recorded by End, Harrell Gave, MD on 09/19/2017 at 4:26 PM EDT Mixed hyperlipidemia with LDL of 133 and elevated fasting glucose noted. Given MI in November and suboptimally controlled LDL, I recommend that we switch from pravastatin to rosuvastatin 10 mg daily. If she is hesitant to make this change, given history of intolerance to atorvastatin, we could arrange for her to be seen in the lipid clinic. Her goal LDL is < 70.   Spoke with pt and went over medication instructions again.  Advised I will send prescription in for her to get started.  Also I advised I am going to send message to Dr. Saunders Revel to see what time frame he would like fasting labs repeated since we are switching medications?  Pt verbalized understanding and was appreciative for call.

## 2017-09-22 NOTE — Telephone Encounter (Signed)
Spoke with pt and she is in agreement to come for labs.  Scheduled pt for 6/7.  Pt verbalized understanding and was appreciative for call.

## 2017-09-23 ENCOUNTER — Ambulatory Visit (HOSPITAL_COMMUNITY): Payer: BLUE CROSS/BLUE SHIELD

## 2017-09-27 ENCOUNTER — Telehealth: Payer: Self-pay | Admitting: Internal Medicine

## 2017-09-27 NOTE — Telephone Encounter (Signed)
Spoke with patient in regards to her medication change from Pravastatin to Rosuvastatin.Marland Kitchen    She was concerned about having the same side effects as Atorvastatin.    She just picked up her Rosuvastatin today so I told her to try it for awhile and keep [place] posted of any physical effects she may experience.  She verbalized understanding.

## 2017-09-27 NOTE — Telephone Encounter (Signed)
Thank you for the update.  She was switched from pravastatin to rosuvastatin because her pravastatin was not adequately controlling her cholesterol.  If she has problems with rosuvastatin, she will need to be seen in the lipid clinic to discuss alternative therapies.  Nelva Bush, MD [facility] Pager: [phone]

## 2017-09-27 NOTE — Telephone Encounter (Signed)
ENw Message   Pt c/o medication issue:  1. Name of Medication: rosuvastatin (CRESTOR) 10 MG tablet  2. How are you currently taking this medication (dosage and times per day)? Take 1 tablet (10 mg total) by mouth daily.  3. Are you having a reaction (difficulty breathing--STAT)? yes  4. What is your medication issue? Pt states she is having an allergic reaction and wants to know why she was taken off her old medication. Please call

## 2017-09-28 ENCOUNTER — Ambulatory Visit (HOSPITAL_COMMUNITY): Payer: BLUE CROSS/BLUE SHIELD

## 2017-09-30 ENCOUNTER — Ambulatory Visit (HOSPITAL_COMMUNITY): Payer: BLUE CROSS/BLUE SHIELD

## 2017-10-05 ENCOUNTER — Ambulatory Visit (HOSPITAL_COMMUNITY): Payer: BLUE CROSS/BLUE SHIELD

## 2017-10-06 NOTE — Telephone Encounter (Signed)
Patient called back to confirm she will make the appointment.

## 2017-10-07 ENCOUNTER — Ambulatory Visit (HOSPITAL_COMMUNITY): Payer: BLUE CROSS/BLUE SHIELD

## 2017-10-12 ENCOUNTER — Ambulatory Visit (HOSPITAL_COMMUNITY): Payer: BLUE CROSS/BLUE SHIELD

## 2017-10-12 ENCOUNTER — Ambulatory Visit (HOSPITAL_BASED_OUTPATIENT_CLINIC_OR_DEPARTMENT_OTHER): Payer: BLUE CROSS/BLUE SHIELD | Attending: Internal Medicine | Admitting: Cardiology

## 2017-10-12 DIAGNOSIS — I493 Ventricular premature depolarization: Secondary | ICD-10-CM | POA: Diagnosis not present

## 2017-10-12 DIAGNOSIS — R0683 Snoring: Secondary | ICD-10-CM | POA: Insufficient documentation

## 2017-10-12 DIAGNOSIS — R5383 Other fatigue: Secondary | ICD-10-CM | POA: Diagnosis not present

## 2017-10-12 DIAGNOSIS — G4733 Obstructive sleep apnea (adult) (pediatric): Secondary | ICD-10-CM

## 2017-10-13 NOTE — Procedures (Signed)
   Patient Name: [name], [name] Study Date:05/17/2017 10/12/2017 Gender: Female D.O.B: 1961/03/09 Age (years): 51 Referring Provider: Nelva Bush MD Height (inches): 63 Interpreting Physician: Fransico Him MD, ABSM Weight (lbs): 140 RPSGT: Earney Hamburg BMI: 25 MRN: [id number] Neck Size: 14.00  CLINICAL INFORMATION Sleep Study Type: NPSG  Indication for sleep study: Fatigue, OSA  Epworth Sleepiness Score: 8  SLEEP STUDY TECHNIQUE As per the AASM Manual for the Scoring of Sleep and Associated Events v2.3 (April 2016) with a hypopnea requiring 4% desaturations.  The channels recorded and monitored were frontal, central and occipital EEG, electrooculogram (EOG), submentalis EMG (chin), nasal and oral airflow, thoracic and abdominal wall motion, anterior tibialis EMG, snore microphone, electrocardiogram, and pulse oximetry.  MEDICATIONS Medications self-administered by patient taken the night of the study : N/A  SLEEP ARCHITECTURE The study was initiated at 9:50:45 PM and ended at 4:55:18 AM.  Sleep onset time was 13.9 minutes and the sleep efficiency was 95.5%%. The total sleep time was 405.6 minutes.  Stage REM latency was 25.0 minutes.  The patient spent 1.4%% of the night in stage N1 sleep, 71.2%% in stage N2 sleep, 0.0%% in stage N3 and 27.49% in REM.  Alpha intrusion was absent.  Supine sleep was 50.03%.  RESPIRATORY PARAMETERS The overall apnea/hypopnea index (AHI) was 3.1 per hour. There were 1 total apneas, including 1 obstructive, 0 central and 0 mixed apneas. There were 20 hypopneas and 5 RERAs.  The AHI during Stage REM sleep was 10.8 per hour.  AHI while supine was 6.2 per hour.  The mean oxygen saturation was 92.3%. The minimum SpO2 during sleep was 85.0%.  moderate snoring was noted during this study.  CARDIAC DATA The 2 lead EKG demonstrated sinus rhythm. The mean heart rate was 74.9 beats per minute. Other EKG findings include: PVCs.  LEG  MOVEMENT DATA The total PLMS were 0 with a resulting PLMS index of 0.0. Associated arousal with leg movement index was 10.1 .  IMPRESSIONS - No significant obstructive sleep apnea occurred during this study (AHI = 3.1/h). - No significant central sleep apnea occurred during this study (CAI = 0.0/h). - Mild oxygen desaturation was noted during this study (Min O2 = 85.0%). - The patient snored with moderate snoring volume. - PVCs were noted during this study. - Clinically significant periodic limb movements did not occur during sleep. Associated arousals were significant.  DIAGNOSIS - Normal Study  RECOMMENDATIONS - Avoid alcohol, sedatives and other CNS depressants that may worsen sleep apnea and disrupt normal sleep architecture. - Sleep hygiene should be reviewed to assess factors that may improve sleep quality. - Weight management and regular exercise should be initiated or continued if appropriate.  [Electronically signed] 10/13/2017 06:43 PM  Fransico Him MD, ABSM Diplomate, American Board of Sleep Medicine

## 2017-10-14 ENCOUNTER — Ambulatory Visit (HOSPITAL_COMMUNITY): Payer: BLUE CROSS/BLUE SHIELD

## 2017-10-14 ENCOUNTER — Telehealth: Payer: Self-pay | Admitting: *Deleted

## 2017-10-14 NOTE — Telephone Encounter (Signed)
-----   Message from Sueanne Margarita, MD sent at 10/13/2017  6:52 PM EDT ----- Please let patient know that sleep study showed no significant sleep apnea.

## 2017-10-14 NOTE — Telephone Encounter (Signed)
Informed patient of sleep study results and patient understanding was verbalized. Pt is aware and agreeable to normal results

## 2017-10-19 ENCOUNTER — Ambulatory Visit (HOSPITAL_COMMUNITY): Payer: BLUE CROSS/BLUE SHIELD

## 2017-10-21 ENCOUNTER — Ambulatory Visit (HOSPITAL_COMMUNITY): Payer: BLUE CROSS/BLUE SHIELD

## 2017-10-26 ENCOUNTER — Ambulatory Visit (HOSPITAL_COMMUNITY): Payer: BLUE CROSS/BLUE SHIELD

## 2017-10-27 ENCOUNTER — Telehealth: Payer: Self-pay | Admitting: Gastroenterology

## 2017-10-27 NOTE — Telephone Encounter (Signed)
Patient with Crohn's. Due follow up and labs. Calls with complaints of abdominal pain. She states is is generalized acroos her entire abdomen. Not certain of any changes in bowel movements. She had leaned across a hard stationary surface. This caused very sharp pain. She has felt this pain since.  OV scheduled for evaluation.

## 2017-10-28 ENCOUNTER — Ambulatory Visit (HOSPITAL_COMMUNITY): Payer: BLUE CROSS/BLUE SHIELD

## 2017-10-28 ENCOUNTER — Encounter: Payer: Self-pay | Admitting: Nurse Practitioner

## 2017-10-28 ENCOUNTER — Ambulatory Visit (INDEPENDENT_AMBULATORY_CARE_PROVIDER_SITE_OTHER): Payer: BLUE CROSS/BLUE SHIELD | Admitting: Nurse Practitioner

## 2017-10-28 ENCOUNTER — Encounter (INDEPENDENT_AMBULATORY_CARE_PROVIDER_SITE_OTHER): Payer: Self-pay

## 2017-10-28 VITALS — BP 126/70 | HR 74 | Ht 63.0 in | Wt 145.4 lb

## 2017-10-28 DIAGNOSIS — K50119 Crohn's disease of large intestine with unspecified complications: Secondary | ICD-10-CM

## 2017-10-28 DIAGNOSIS — M7918 Myalgia, other site: Secondary | ICD-10-CM

## 2017-10-28 DIAGNOSIS — R140 Abdominal distension (gaseous): Secondary | ICD-10-CM | POA: Diagnosis not present

## 2017-10-28 MED ORDER — RIFAXIMIN 550 MG PO TABS: 550.0000 mg | ORAL_TABLET | Freq: Three times a day (TID) | ORAL | 0 refills | Status: DC

## 2017-10-28 MED ORDER — RIFAXIMIN 550 MG PO TABS: 550.0000 mg | ORAL_TABLET | Freq: Two times a day (BID) | ORAL | 0 refills | Status: DC

## 2017-10-28 MED ORDER — METHOCARBAMOL 500 MG PO TABS: 500.0000 mg | ORAL_TABLET | Freq: Every day | ORAL | 0 refills | Status: DC

## 2017-10-28 NOTE — Progress Notes (Signed)
IMPRESSION and PLAN:    #1. 57 yo female with longstanding small bowel Crohn's disease, in clinical and endoscopic/ histologic remission at time of last colonoscopy Feb 2017. Currently maintained on Budesonide 9 mg daily. It is unclear if patient has had a terminal ileal resection.  Endoscopically it appeared she did but patient denies history of any bowel surgeries.  -upper abdominal pain most likely musculoskeletal. Short trial of Robaxin at bedtime. Rest the area.  -will call in a few days to get condition update and wll likely decrease Entocort to 67m for 2 weeks then 3 mg daily for 2 weeks.    -Follow up with Dr. NSilverio Decampin 2-3 months    #2. Daily bloating. Very uncomfortable. Bloating mostly postprandial, gets worse as day goes on. She has gained several pounds as well.  -Diabetes puts her at risk for SIBO. It is still unclear whether she has had a TI resection, endoscopically she appeared to be patient denies it. If she did have one then that increases risk of SIBO as well.   Trial of xifaxan if insurance will pay.       HPI:    Chief Complaint: abdominal pain   Patient is a 57year old female with CAD / PCI, and longstanding history of Crohn's disease of small bowel diagnosed in 115 Previously followed by Dr. BOlevia Perches most recently by Dr. NBing Reecolonoscopy with biopsies January 2017, while on 6-MP, did not show any active disease.  The 6MP was stopped June 2017 for persistent transaminitis. Subsequently patient got C. difficile colitis followed by Crohn's flare which did not improve despite clearance of C. Difficile.  She was started on Humira but that was eventually stopped when drug trough level was undetectable and antibodies significantly elevated. She has been on Budesonide 9 mg daily in November 2018.    Patient is worked in for evaluation of abdominal pain. Last Tuesday she cleaned windshield of a truck. In the process of doing so she stretched over the side of  the windshield to reach across it.  At that point she had severe pain across her upper abdomen where edge of windshield had been pressing into her. Since then she has felt tender inside but not really having pain. Bowel movements are at baseline, 2-3 formed stools a day. No blood.  No other acute symptoms. She has chronic nausea.  She feels like chronic abdominal bloating is getting worse. Her weight is up 10 pounds.    Review of systems:     No chest pain, no SOB, no fevers, no urinary sx   Past Medical History:  Diagnosis Date  . Allergic rhinitis due to pollen   . Anxiety state, unspecified   . Arthritis   . Bronchitis    hx  . Calculus of gallbladder without mention of cholecystitis or obstruction   . Crohn disease ([place]   . Depressive disorder, not elsewhere classified   . Diabetes mellitus without complication (HCC)    diet controlled  . Esophageal reflux   . Fibromyalgia   . Headache   . History of nuclear stress test    ETT-Myoview 10/17: EF 55%, normal perfusion; Low Risk  . Myalgia and myositis, unspecified   . [place]STEMI [place]ST [place] 04/15[place]  . Other and unspecified hyperlipidemia   . Palpitations   . Pneumonia 15   hx  . S/P angioplasty with stent 04/18/17 with DES to pLAD 04/19/2017  . Unspecified essential hypertension   .  Vitamin B deficiency     Patient's surgical history, family medical history, social history, medications and allergies were all reviewed in Epic    Physical Exam:     BP 126/70   Pulse 74   Ht 5' 3"  (1.6 m)   Wt 145 lb 6.4 oz (66 kg)   LMP  (LMP Unknown)   SpO2 96%   BMI 25.76 kg/m   GENERAL:  Pleasant female in NAD PSYCH: : Cooperative, normal affect EENT:  conjunctiva pink, mucous membranes moist, neck supple without masses CARDIAC:  RRR, murmur heard, no peripheral edema PULM: Normal respiratory effort, lungs CTA bilaterally, no wheezing ABDOMEN:  Nondistended, soft, nontender. No obvious masses,  no hepatomegaly,  normal bowel sounds SKIN:  turgor, no lesions seen Musculoskeletal:  Normal muscle tone, normal strength. Negative Carnett's NEURO: Alert and oriented x 3, no focal neurologic deficits   Tye Savoy , NP 10/28/2017, 2:58 PM

## 2017-10-28 NOTE — Patient Instructions (Signed)
If you are age 59 or older, your body mass index should be between 23-30. Your Body mass index is 25.76 kg/m. If this is out of the aforementioned range listed, please consider follow up with your Primary Care Provider.  If you are age 54 or younger, your body mass index should be between 19-25. Your Body mass index is 25.76 kg/m. If this is out of the aformentioned range listed, please consider follow up with your Primary Care Provider.   We have sent the following medications to your pharmacy for you to pick up at your convenience: Robaxin Xifaxan - [facility]  Call after Xifaxan with an update on symptoms.  Thank you for choosing me and [facility].   Tye Savoy, NP

## 2017-11-01 ENCOUNTER — Telehealth: Payer: Self-pay | Admitting: Gastroenterology

## 2017-11-01 ENCOUNTER — Telehealth: Payer: Self-pay

## 2017-11-01 ENCOUNTER — Encounter: Payer: Self-pay | Admitting: Nurse Practitioner

## 2017-11-01 NOTE — Telephone Encounter (Signed)
-----   Message from Willia Craze, NP sent at 11/01/2017  9:39 AM EDT ----- [name],  Please call patient and have her decrease budesonide to 6 mg daily for 2 weeks followed by 3 mg for 2 weeks then stop.  If she has any recurrent Crohn's symptoms like bowel changes or blood in stool then we need to know ASAP.  Also get appt with Nandigam in 2-3 months.   We are still confused about whether she has had a terminal ileal resection.  It appeared on colonoscopy that she had but if she says she did not then like if she did not

## 2017-11-01 NOTE — Telephone Encounter (Signed)
Patient aware.

## 2017-11-01 NOTE — Telephone Encounter (Signed)
[place] I think you are working on this patient

## 2017-11-01 NOTE — Telephone Encounter (Signed)
That is correct. Xifaxan was sent to [facility].

## 2017-11-01 NOTE — Telephone Encounter (Signed)
Spoke with the patient. She expresses understanding of the instructions to taper.  Discussed the issue of colon surgery. She states understanding of this issue also. Xifaxan Rx has been sent to [facility]. Can she try FDgard for bloating while she waits for this medication?

## 2017-11-01 NOTE — Telephone Encounter (Signed)
Ok to try FD Gard/IB Gard 1 capsule TID PRN

## 2017-11-02 ENCOUNTER — Ambulatory Visit (HOSPITAL_COMMUNITY): Payer: BLUE CROSS/BLUE SHIELD

## 2017-11-02 NOTE — Progress Notes (Signed)
Reviewed and agree with documentation and assessment and plan. K. Veena Nandigam , MD   

## 2017-11-04 ENCOUNTER — Ambulatory Visit (HOSPITAL_COMMUNITY): Payer: BLUE CROSS/BLUE SHIELD

## 2017-11-04 ENCOUNTER — Other Ambulatory Visit: Payer: BLUE CROSS/BLUE SHIELD

## 2017-11-09 ENCOUNTER — Ambulatory Visit (HOSPITAL_COMMUNITY): Payer: BLUE CROSS/BLUE SHIELD

## 2017-11-10 ENCOUNTER — Telehealth: Payer: Self-pay

## 2017-11-10 NOTE — Telephone Encounter (Signed)
-----   Message from Willia Craze, NP sent at 11/09/2017  5:28 PM EDT ----- Eustaquio Maize, we check on [name].  I think she was having musculoskeletal pain and not Crohn's flare.  If still doing okay from a Crohn's standpoint then let us decrease her Entocort to 6 mg daily.  If still doing well after 2 to 3 weeks then have her decrease dose to 3 mg daily for another 2 weeks.  Following that if still doing well she can stop it.  Please make sure she has an appointment in 2 to 3 months. Thanks

## 2017-11-10 NOTE — Telephone Encounter (Signed)
Left a message to call back with her progress. Descuss decreasing the Entocort.

## 2017-11-11 ENCOUNTER — Ambulatory Visit (HOSPITAL_COMMUNITY): Payer: BLUE CROSS/BLUE SHIELD

## 2017-11-14 ENCOUNTER — Telehealth: Payer: Self-pay

## 2017-11-14 ENCOUNTER — Other Ambulatory Visit: Payer: Self-pay

## 2017-11-14 NOTE — Telephone Encounter (Signed)
Spoke with the patient and gave her Dr Darnelle Bos recommendation.  She will have the ALT lab drawn at her physician's office the week of 7/29.

## 2017-11-16 ENCOUNTER — Ambulatory Visit (HOSPITAL_COMMUNITY): Payer: BLUE CROSS/BLUE SHIELD

## 2017-11-16 DIAGNOSIS — Z6826 Body mass index (BMI) 26.0-26.9, adult: Secondary | ICD-10-CM | POA: Diagnosis not present

## 2017-11-16 DIAGNOSIS — Z124 Encounter for screening for malignant neoplasm of cervix: Secondary | ICD-10-CM | POA: Diagnosis not present

## 2017-11-16 DIAGNOSIS — Z01419 Encounter for gynecological examination (general) (routine) without abnormal findings: Secondary | ICD-10-CM | POA: Diagnosis not present

## 2017-11-16 DIAGNOSIS — Z1231 Encounter for screening mammogram for malignant neoplasm of breast: Secondary | ICD-10-CM | POA: Diagnosis not present

## 2017-11-18 ENCOUNTER — Ambulatory Visit (HOSPITAL_COMMUNITY): Payer: BLUE CROSS/BLUE SHIELD

## 2017-11-21 NOTE — Telephone Encounter (Signed)
Called patient. She is taking 2 budesonide daily now. She will contact us with any problems.

## 2017-11-22 ENCOUNTER — Other Ambulatory Visit: Payer: Self-pay | Admitting: Physician Assistant

## 2017-11-22 NOTE — Telephone Encounter (Signed)
Yes. She says she has written down instructions and she will call us if she has problems during her taper.

## 2017-11-22 NOTE — Telephone Encounter (Signed)
[name], just to clarify..  We decreased Budesonide dose from 98m to to 6 mg after talking to her on phone yesterday. She will make further decrease from 6 mg to 3 mg in a couple of weeks.  Then after two weeks of 3 mg she will stop the med all together if still feeling fine correct?   Thanks

## 2017-11-28 ENCOUNTER — Telehealth: Payer: Self-pay | Admitting: Gastroenterology

## 2017-11-28 ENCOUNTER — Other Ambulatory Visit: Payer: Self-pay | Admitting: Cardiology

## 2017-11-28 NOTE — Telephone Encounter (Signed)
Noted  

## 2017-11-28 NOTE — Telephone Encounter (Signed)
Pt wanted to let you know that medication is working, she is taking 2 pills a day and is feeling fine.

## 2017-12-02 ENCOUNTER — Other Ambulatory Visit: Payer: Self-pay | Admitting: Gastroenterology

## 2017-12-21 ENCOUNTER — Telehealth: Payer: Self-pay | Admitting: Internal Medicine

## 2017-12-21 NOTE — Telephone Encounter (Signed)
Spoke with patient inquiring about Lipid Panel results drawn recently and reminder about upcoming ALT week of 7/29 at her PCPs office.

## 2017-12-21 NOTE — Telephone Encounter (Signed)
New Message:      Pt is calling and states we were supposed to receive some labs from her office of practice and we were supposed give her the results.

## 2017-12-21 NOTE — Telephone Encounter (Signed)
I have reviewed her outside labs.  LDL is well controlled.  Triglycerides are still a little high.  I recommend avoiding carbs and other sweets.  I do not see that an ALT was done; this should be drawn at her convenience.  If she has other lab questions, she should address them with her PCP.  Nelva Bush, MD [facility] Pager: [phone]

## 2018-01-03 DIAGNOSIS — M797 Fibromyalgia: Secondary | ICD-10-CM | POA: Diagnosis not present

## 2018-01-03 DIAGNOSIS — M7989 Other specified soft tissue disorders: Secondary | ICD-10-CM | POA: Diagnosis not present

## 2018-01-03 DIAGNOSIS — R413 Other amnesia: Secondary | ICD-10-CM | POA: Diagnosis not present

## 2018-01-03 DIAGNOSIS — R5383 Other fatigue: Secondary | ICD-10-CM | POA: Diagnosis not present

## 2018-01-03 DIAGNOSIS — M2550 Pain in unspecified joint: Secondary | ICD-10-CM | POA: Diagnosis not present

## 2018-01-10 ENCOUNTER — Telehealth: Payer: Self-pay | Admitting: Internal Medicine

## 2018-01-10 NOTE — Telephone Encounter (Signed)
New message  Patient is needing a requisition order for labs(liver function). Patient is at the [facility] in [place], [place]. The Fax # is [phone]. Please fax ASAP.

## 2018-01-10 NOTE — Telephone Encounter (Signed)
Spoke to the patient and she confirmed that she had an ALT lab drawn at the [facility] in [place] today 8/13.  They will fax the results to [place] by Thursday's appt.

## 2018-01-12 ENCOUNTER — Encounter: Payer: Self-pay | Admitting: Internal Medicine

## 2018-01-12 ENCOUNTER — Ambulatory Visit: Payer: BLUE CROSS/BLUE SHIELD | Admitting: Internal Medicine

## 2018-01-12 VITALS — BP 128/86 | HR 85 | Ht 63.0 in | Wt 146.4 lb

## 2018-01-12 DIAGNOSIS — E785 Hyperlipidemia, unspecified: Secondary | ICD-10-CM | POA: Diagnosis not present

## 2018-01-12 DIAGNOSIS — R5382 Chronic fatigue, unspecified: Secondary | ICD-10-CM

## 2018-01-12 DIAGNOSIS — I2510 Atherosclerotic heart disease of native coronary artery without angina pectoris: Secondary | ICD-10-CM | POA: Diagnosis not present

## 2018-01-12 DIAGNOSIS — I10 Essential (primary) hypertension: Secondary | ICD-10-CM

## 2018-01-12 NOTE — Progress Notes (Signed)
Follow-up Outpatient Visit Date: 01/12/2018  Primary Care Provider: Leonides Sake, MD [address] [place] [address]  Chief Complaint: Fatigue  HPI:  [name] is a 57 y.o. year-old female with history of coronary artery disease with NSTEMI (03/2017), hypertension, hyperlipidemia, diabetes mellitus, and Crohn's diseas, who presents for follow-up of coronary artery disease.  I last saw her in April, at which time her only complaint was of fatigue.  She was not sleeping well at night nor was she feeling well rested in the mornings.  She reported having undergone a sleep study many years ago but was not using CPAP despite being diagnosed with mild OSA.  Subsequent lipid panel showed suboptimally controlled LDL.  We therefore transition from pravastatin to rosuvastatin 10 mg daily  Today, [name] is most concerned about continued fatigue.  She does not sleep well at night.  Sleep study in May was normal.  She has not had any chest pain.  She has mild exertional dyspnea when outside in the heat.  She is not short of breath when doing activities inside.  She has noted some soreness and tightness in her legs, particularly the calves, when climbing stairs.  Outside labs by her PCP noted mild transaminitis.  She has not had any orthopnea, PND, or edema.  --------------------------------------------------------------------------------------------------  Past Medical History:  Diagnosis Date  . Allergic rhinitis due to pollen   . Anxiety state, unspecified   . Arthritis   . Bronchitis    hx  . Calculus of gallbladder without mention of cholecystitis or obstruction   . Crohn disease ([place])   . Depressive disorder, not elsewhere classified   . Diabetes mellitus without complication (HCC)    diet controlled  . Esophageal reflux   . Fibromyalgia   . Headache   . History of nuclear stress test    ETT-Myoview 10/17: EF 55%, normal perfusion; Low Risk  . Myalgia and myositis,  unspecified   . NSTEMI (non-ST elevated myocardial infarction) ([place]) 04/15/2017  . Other and unspecified hyperlipidemia   . Palpitations   . Pneumonia 15   hx  . S/P angioplasty with stent 04/18/17 with DES to pLAD 04/19/2017  . Unspecified essential hypertension   . Vitamin B deficiency    Past Surgical History:  Procedure Laterality Date  . CHOLECYSTECTOMY N/A 09/19/2014   Procedure: LAPAROSCOPIC CHOLECYSTECTOMY WITH INTRAOPERATIVE CHOLANGIOGRAM;  Surgeon: Donnie Mesa, MD;  Location: [facility];  Service: General;  Laterality: N/A;  . CORONARY STENT INTERVENTION N/A 04/18/2017   Procedure: CORONARY STENT INTERVENTION;  Surgeon: Martinique, Peter M, MD;  Location: [facility];  Service: Cardiovascular;  Laterality: N/A;  . ESOPHAGOGASTRODUODENOSCOPY    . EXPLORATORY LAPAROTOMY     for endometriosis  . INTRAVASCULAR PRESSURE WIRE/FFR STUDY N/A 04/18/2017   Procedure: INTRAVASCULAR PRESSURE WIRE/FFR STUDY;  Surgeon: Martinique, Peter M, MD;  Location: [facility];  Service: Cardiovascular;  Laterality: N/A;  . LEFT HEART CATH AND CORONARY ANGIOGRAPHY N/A 04/18/2017   Procedure: LEFT HEART CATH AND CORONARY ANGIOGRAPHY;  Surgeon: Martinique, Peter M, MD;  Location: [facility];  Service: Cardiovascular;  Laterality: N/A;  . TONSILLECTOMY AND ADENOIDECTOMY    . TUBAL LIGATION     BILATERAL    Current Meds  Medication Sig  . albuterol (PROVENTIL) (2.5 MG/3ML) 0.083% nebulizer solution albuterol sulfate 2.5 mg/3 mL (0.083 %) solution for nebulization  . aspirin 81 MG chewable tablet Chew 81 mg by mouth daily.    Marland Kitchen  atorvastatin (LIPITOR) 20 MG tablet atorvastatin 20 mg tablet  . budesonide (ENTOCORT EC) 3 MG 24 hr capsule TAKE 3 CAPSULES BY MOUTH ONCE DAILY  . cefUROXime (CEFTIN) 500 MG tablet cefuroxime axetil 500 mg tablet  . celecoxib (CELEBREX) 200 MG capsule celecoxib 200 mg capsule  . clopidogrel (PLAVIX) 75 MG tablet Take 1 tablet (75 mg total) by mouth daily.  . colestipol  (COLESTID) 1 g tablet Take 1 tablet (1 g total) by mouth 2 (two) times daily.  . cyclobenzaprine (FLEXERIL) 10 MG tablet cyclobenzaprine 10 mg tablet  . doxycycline (VIBRA-TABS) 100 MG tablet doxycycline hyclate 100 mg tablet  TK 1 T PO  BID  . DULoxetine (CYMBALTA) 60 MG capsule Take 60 mg by mouth daily.  . enalapril (VASOTEC) 5 MG tablet Take 1 tablet (5 mg total) by mouth daily.  Marland Kitchen EPIPEN 2-PAK 0.3 MG/0.3ML SOAJ injection Reported on 06/27/2015  . fenofibrate (TRICOR) 145 MG tablet TAKE ONE TABLET BY MOUTH DAILY  . glimepiride (AMARYL) 2 MG tablet Take 1 mg by mouth daily with breakfast.  . levofloxacin (LEVAQUIN) 500 MG tablet levofloxacin 500 mg tablet  . mercaptopurine (PURINETHOL) 50 MG tablet mercaptopurine 50 mg tablet  . metFORMIN (GLUCOPHAGE) 500 MG tablet Take 1 tablet (500 mg total) by mouth 2 (two) times daily with a meal.  . methocarbamol (ROBAXIN) 500 MG tablet Take 1 tablet (500 mg total) by mouth at bedtime. Take for 7 days.  No alcohol on medication.  . metoprolol succinate (TOPROL-XL) 50 MG 24 hr tablet Take 1 tablet (50 mg total) by mouth daily. Take with or immediately following a meal.  . metroNIDAZOLE (FLAGYL) 250 MG tablet metronidazole 250 mg tablet  . montelukast (SINGULAIR) 10 MG tablet Take 10 mg by mouth every evening.  . pantoprazole (PROTONIX) 40 MG tablet Take 1 tablet (40 mg total) by mouth daily.  . pravastatin (PRAVACHOL) 80 MG tablet pravastatin 80 mg tablet  . promethazine (PHENERGAN) 25 MG tablet promethazine 25 mg tablet  . sertraline (ZOLOFT) 50 MG tablet sertraline 50 mg tablet  . zolpidem (AMBIEN) 10 MG tablet Take 10 mg by mouth at bedtime as needed for sleep.   . [DISCONTINUED] rosuvastatin (CRESTOR) 10 MG tablet Take 1 tablet (10 mg total) by mouth daily.   Current Facility-Administered Medications for the 01/12/18 encounter (Office Visit) with Demeco Ducksworth, Harrell Gave, MD  Medication  . nitroGLYCERIN (NITROSTAT) SL tablet 0.3 mg    Allergies: Lipitor  [atorvastatin] and Ticagrelor  Social History   Tobacco Use  . Smoking status: Former Smoker    Packs/day: 1.00    Years: 35.00    Pack years: 35.00    Types: Cigarettes    Last attempt to quit: 05/31/2004    Years since quitting: 13.6  . Smokeless tobacco: Never Used  Substance Use Topics  . Alcohol use: No  . Drug use: No    Family History  Problem Relation Age of Onset  . Lung cancer Maternal Grandmother   . Stroke Father   . Hypertension Mother   . Heart attack Mother   . Heart disease Mother   . Hypertension Brother   . Heart disease Brother   . Hypertension Sister   . Heart disease Sister   . Coronary artery disease Brother   . Colon cancer Neg Hx   . Stomach cancer Neg Hx     Review of Systems: A 12-system review of systems was performed and was negative except as noted in the HPI.  --------------------------------------------------------------------------------------------------  Physical Exam: BP 128/86   Pulse 85   Ht 5' 3"  (1.6 m)   Wt 146 lb 6.4 oz (66.4 kg)   LMP  (LMP Unknown)   SpO2 95%   BMI 25.93 kg/m   General: NAD. HEENT: No conjunctival pallor or scleral icterus. Moist mucous membranes.  OP clear. Neck: Supple without lymphadenopathy, thyromegaly, JVD, or HJR. Lungs: Normal work of breathing. Clear to auscultation bilaterally without wheezes or crackles. Heart: Regular rate and rhythm without murmurs, rubs, or gallops. Non-displaced PMI. Abd: Bowel sounds present. Soft, NT/ND without hepatosplenomegaly Ext: No lower extremity edema. Radial, PT, and DP pulses are 2+ bilaterally. Skin: Warm and dry without rash.   Lab Results  Component Value Date   WBC 7.2 04/28/2017   HGB 13.5 04/28/2017   HCT 40.2 04/28/2017   MCV 93.2 04/28/2017   PLT 314.0 04/28/2017    Lab Results  Component Value Date   NA 139 04/28/2017   K 4.2 04/28/2017   CL 103 04/28/2017   CO2 28 04/28/2017   BUN 22 04/28/2017   CREATININE 0.70 04/28/2017    GLUCOSE 164 (H) 04/28/2017   ALT 24 04/28/2017    Lab Results  Component Value Date   CHOL 247 (H) 12/18/2015   HDL 34.10 (L) 12/18/2015   LDLCALC 127 08/27/2013   LDLDIRECT 85.0 12/18/2015   TRIG (H) 12/18/2015    946.0 Triglyceride is over 400; calculations on Lipids are invalid.   CHOLHDL 7 12/18/2015   Outside labs (01/10/2018): CBC: WBC 8.0, Hgb 14.0, HCT 41.9, platelets 270  CMP: Sodium 142, potassium 4.3, chloride 100, CO2 26, BUN 21, creatinine 0.71, glucose 284, calcium 9.7, AST 38, ALT 47, total bilirubin 0.3, total protein 6.7, albumin 4.4 --------------------------------------------------------------------------------------------------  ASSESSMENT AND PLAN: Coronary artery disease No episodes of chest pain following PCI to the LAD last year.  Her shortness of breath with activity in the heat is nonspecific.  I do not believe this represents worsening coronary insufficiency.  As she is tolerating dual antiplatelet therapy with aspirin and clopidogrel well, we will complete up to 12 months of DAPT.  Hyperlipidemia Patient reports some pain in her calves, which I am concerned may be myopathy related to statin therapy.  She has good pedal pulses, which make me less concerned for PAD.  We have agreed to a 1 month statin holiday.  If her symptoms improve, lipid clinic for further management.  If her pain persist, she should restart statin, and we will need to consider ABIs to exclude PAD.  Hypertension Diastolic blood pressure mildly elevated.  Encouraged current medications and lifestyle modifications.  Fatigue Likely multifactorial.  Sleep study in May showed no evidence of OSA.  I will defer further management to her PCP.  Follow-up: Return to clinic in 1 month with APP in [place].  Nelva Bush, MD 01/14/2018 10:05 AM

## 2018-01-12 NOTE — Patient Instructions (Addendum)
Medication Instructions:  HOLD Crestor   -- If you need a refill on your cardiac medications before your next appointment, please call your pharmacy. --  Labwork: None ordered  Testing/Procedures: None ordered  Follow-Up: Your physician wants you to follow-up in: 1 month with APP in [place]    Thank you for choosing [facility]!!    Any Other Special Instructions Will Be Listed Below (If Applicable).

## 2018-01-14 ENCOUNTER — Encounter: Payer: Self-pay | Admitting: Internal Medicine

## 2018-01-14 DIAGNOSIS — R5382 Chronic fatigue, unspecified: Secondary | ICD-10-CM | POA: Insufficient documentation

## 2018-01-26 ENCOUNTER — Other Ambulatory Visit: Payer: Self-pay | Admitting: Cardiology

## 2018-01-26 ENCOUNTER — Other Ambulatory Visit: Payer: Self-pay | Admitting: Gastroenterology

## 2018-01-26 DIAGNOSIS — L821 Other seborrheic keratosis: Secondary | ICD-10-CM | POA: Diagnosis not present

## 2018-01-26 DIAGNOSIS — D485 Neoplasm of uncertain behavior of skin: Secondary | ICD-10-CM | POA: Diagnosis not present

## 2018-01-26 DIAGNOSIS — L570 Actinic keratosis: Secondary | ICD-10-CM | POA: Diagnosis not present

## 2018-01-26 DIAGNOSIS — L989 Disorder of the skin and subcutaneous tissue, unspecified: Secondary | ICD-10-CM | POA: Diagnosis not present

## 2018-01-26 DIAGNOSIS — L820 Inflamed seborrheic keratosis: Secondary | ICD-10-CM | POA: Diagnosis not present

## 2018-01-26 DIAGNOSIS — L578 Other skin changes due to chronic exposure to nonionizing radiation: Secondary | ICD-10-CM | POA: Diagnosis not present

## 2018-02-11 ENCOUNTER — Other Ambulatory Visit: Payer: Self-pay

## 2018-02-11 ENCOUNTER — Encounter (HOSPITAL_COMMUNITY): Payer: Self-pay | Admitting: *Deleted

## 2018-02-11 ENCOUNTER — Emergency Department (HOSPITAL_COMMUNITY)
Admission: EM | Admit: 2018-02-11 | Discharge: 2018-02-11 | Disposition: A | Discharge status: Home / Self Care | Payer: BLUE CROSS/BLUE SHIELD | Attending: Emergency Medicine | Admitting: Emergency Medicine

## 2018-02-11 ENCOUNTER — Emergency Department (HOSPITAL_COMMUNITY): Payer: BLUE CROSS/BLUE SHIELD

## 2018-02-11 DIAGNOSIS — I2510 Atherosclerotic heart disease of native coronary artery without angina pectoris: Secondary | ICD-10-CM | POA: Insufficient documentation

## 2018-02-11 DIAGNOSIS — Z7902 Long term (current) use of antithrombotics/antiplatelets: Secondary | ICD-10-CM | POA: Insufficient documentation

## 2018-02-11 DIAGNOSIS — R0782 Intercostal pain: Secondary | ICD-10-CM | POA: Diagnosis not present

## 2018-02-11 DIAGNOSIS — Z79899 Other long term (current) drug therapy: Secondary | ICD-10-CM | POA: Diagnosis not present

## 2018-02-11 DIAGNOSIS — E119 Type 2 diabetes mellitus without complications: Secondary | ICD-10-CM | POA: Insufficient documentation

## 2018-02-11 DIAGNOSIS — Z7984 Long term (current) use of oral hypoglycemic drugs: Secondary | ICD-10-CM | POA: Diagnosis not present

## 2018-02-11 DIAGNOSIS — R079 Chest pain, unspecified: Secondary | ICD-10-CM

## 2018-02-11 DIAGNOSIS — R072 Precordial pain: Secondary | ICD-10-CM | POA: Insufficient documentation

## 2018-02-11 DIAGNOSIS — Z87891 Personal history of nicotine dependence: Secondary | ICD-10-CM | POA: Diagnosis not present

## 2018-02-11 DIAGNOSIS — Z7982 Long term (current) use of aspirin: Secondary | ICD-10-CM | POA: Insufficient documentation

## 2018-02-11 DIAGNOSIS — I252 Old myocardial infarction: Secondary | ICD-10-CM | POA: Insufficient documentation

## 2018-02-11 LAB — CBC
HEMATOCRIT: 45.2 % (ref 36.0–46.0)
Hemoglobin: 14.3 g/dL (ref 12.0–15.0)
MCH: 30.8 pg (ref 26.0–34.0)
MCHC: 31.6 g/dL (ref 30.0–36.0)
MCV: 97.2 fL (ref 78.0–100.0)
Platelets: 275 10*3/uL (ref 150–400)
RBC: 4.65 MIL/uL (ref 3.87–5.11)
RDW: 12.9 % (ref 11.5–15.5)
WBC: 8.9 10*3/uL (ref 4.0–10.5)

## 2018-02-11 LAB — I-STAT TROPONIN, ED
TROPONIN I, POC: 0 ng/mL (ref 0.00–0.08)
Troponin i, poc: 0 ng/mL (ref 0.00–0.08)

## 2018-02-11 LAB — BASIC METABOLIC PANEL
Anion gap: 14 (ref 5–15)
BUN: 11 mg/dL (ref 6–20)
CHLORIDE: 100 mmol/L (ref 98–111)
CO2: 25 mmol/L (ref 22–32)
Calcium: 9 mg/dL (ref 8.9–10.3)
Creatinine, Ser: 0.64 mg/dL (ref 0.44–1.00)
GFR calc non Af Amer: >60 mL/min (ref >60)
Glucose, Bld: 118 mg/dL — ABNORMAL HIGH (ref 70–99)
POTASSIUM: 4.4 mmol/L (ref 3.5–5.1)
SODIUM: 139 mmol/L (ref 135–145)

## 2018-02-11 NOTE — ED Notes (Signed)
Patient verbalizes understanding of discharge instructions. Opportunity for questioning and answers were provided. Armband removed by staff, pt discharged from ED.  

## 2018-02-11 NOTE — ED Triage Notes (Signed)
Pt in c/o chest pain that started about an hour ago, pain was severe at first, resolved, and then returned but more mild, radiates into her back, denies n/v, denies shortness of breath

## 2018-02-11 NOTE — ED Provider Notes (Signed)
[facility] EMERGENCY DEPARTMENT Provider Note   CSN: [id number] Arrival date & time: 02/11/18  1033     History   Chief Complaint Chief Complaint  Patient presents with  . Chest Pain    HPI [name] is a 57 y.o. female.  The history is provided by the patient.  Chest Pain   This is a new problem. The current episode started 1 to 2 hours ago. The problem occurs hourly. The problem has been resolved. The pain is associated with exertion. The pain is present in the substernal region. The pain is at a severity of 4/10. The pain is mild. The quality of the pain is described as pressure-like. The pain radiates to the left shoulder and right shoulder. Pertinent negatives include no abdominal pain, no back pain, no cough, no dizziness, no fever, no hemoptysis, no irregular heartbeat, no leg pain, no lower extremity edema, no nausea, no palpitations, no PND, no shortness of breath, no sputum production, no vomiting and no weakness. She has tried rest for the symptoms.  Her past medical history is significant for CAD.  Pertinent negatives for past medical history include no seizures.  Procedure history is positive for cardiac catheterization (stent last year).    Past Medical History:  Diagnosis Date  . Allergic rhinitis due to pollen   . Anxiety state, unspecified   . Arthritis   . Bronchitis    hx  . Calculus of gallbladder without mention of cholecystitis or obstruction   . Crohn disease ([place])   . Depressive disorder, not elsewhere classified   . Diabetes mellitus without complication (HCC)    diet controlled  . Esophageal reflux   . Fibromyalgia   . Headache   . History of nuclear stress test    ETT-Myoview 10/17: EF 55%, normal perfusion; Low Risk  . Myalgia and myositis, unspecified   . NSTEMI (non-ST elevated myocardial infarction) ([place]) 04/15/2017  . Other and unspecified hyperlipidemia   . Palpitations   . Pneumonia 15   hx  . S/P angioplasty  with stent 04/18/17 with DES to pLAD 04/19/2017  . Unspecified essential hypertension   . Vitamin B deficiency     Patient Active Problem List   Diagnosis Date Noted  . Chronic fatigue 01/14/2018  . Obstructive sleep apnea 09/17/2017  . Diabetes mellitus ([place]) 09/17/2017  . S/P angioplasty with stent 04/18/17 with DES to pLAD 04/19/2017  . NSTEMI (non-ST elevated myocardial infarction) ([place])   . SOB (shortness of breath)   . Family history of early CAD   . Chest pain 04/16/2017  . Fatty infiltration of liver 12/23/2015  . Coronary artery disease involving native coronary artery of native heart without angina pectoris 10/10/2011  . Diabetes mellitus type 2, uncomplicated ([place]) 28/41/3244  . Essential hypertension 08/18/2009  . CHEST PAIN UNSPECIFIED 08/01/2009  . B12 DEFICIENCY 01/06/2009  . Hyperlipidemia LDL goal <70 03/16/2007  . ANXIETY 03/16/2007  . DEPRESSION 03/16/2007  . Seasonal and perennial allergic rhinitis 03/16/2007  . GERD 03/16/2007  . CHOLELITHIASIS W/O CHOLECYSTITIS W/O OBST 03/16/2007  . SACROILIITIS NEC 03/16/2007  . FIBROMYALGIA 03/16/2007  . PALPITATIONS 03/16/2007  . Crohn's disease ([place]) 03/16/2007    Past Surgical History:  Procedure Laterality Date  . CHOLECYSTECTOMY N/A 09/19/2014   Procedure: LAPAROSCOPIC CHOLECYSTECTOMY WITH INTRAOPERATIVE CHOLANGIOGRAM;  Surgeon: Donnie Mesa, MD;  Location: [facility];  Service: General;  Laterality: N/A;  . CORONARY STENT INTERVENTION N/A 04/18/2017   Procedure: CORONARY STENT INTERVENTION;  Surgeon:  Martinique, Peter M, MD;  Location: [facility];  Service: Cardiovascular;  Laterality: N/A;  . ESOPHAGOGASTRODUODENOSCOPY    . EXPLORATORY LAPAROTOMY     for endometriosis  . INTRAVASCULAR PRESSURE WIRE/FFR STUDY N/A 04/18/2017   Procedure: INTRAVASCULAR PRESSURE WIRE/FFR STUDY;  Surgeon: Martinique, Peter M, MD;  Location: [facility];  Service: Cardiovascular;  Laterality: N/A;  . LEFT HEART CATH AND CORONARY  ANGIOGRAPHY N/A 04/18/2017   Procedure: LEFT HEART CATH AND CORONARY ANGIOGRAPHY;  Surgeon: Martinique, Peter M, MD;  Location: [facility];  Service: Cardiovascular;  Laterality: N/A;  . TONSILLECTOMY AND ADENOIDECTOMY    . TUBAL LIGATION     BILATERAL     OB History   None      Home Medications    Prior to Admission medications   Medication Sig Start Date End Date Taking? Authorizing Provider  albuterol (PROVENTIL) (2.5 MG/3ML) 0.083% nebulizer solution Take 2.5 mg by nebulization every 4 (four) hours as needed.    Yes [provider]  Ascorbic Acid (VITAMIN C) 1000 MG tablet Take 1,000 mg by mouth at bedtime.   Yes [provider]  aspirin 81 MG chewable tablet Chew 81 mg by mouth daily.     Yes [provider]  budesonide (ENTOCORT EC) 3 MG 24 hr capsule TAKE 3 CAPSULES BY MOUTH ONCE DAILY Patient taking differently: Take 9 mg by mouth at bedtime.  12/02/17  Yes Nandigam, Venia Minks, MD  clopidogrel (PLAVIX) 75 MG tablet Take 1 tablet (75 mg total) by mouth daily. 11/28/17  Yes End, Harrell Gave, MD  DULoxetine (CYMBALTA) 60 MG capsule Take 60 mg by mouth daily.   Yes [provider]  enalapril (VASOTEC) 5 MG tablet Take 1 tablet (5 mg total) by mouth daily. 06/24/14  Yes Larey Dresser, MD  EPIPEN 2-PAK 0.3 MG/0.3ML SOAJ injection Reported on 06/27/2015 10/09/14  Yes [provider]  fenofibrate (TRICOR) 145 MG tablet TAKE ONE TABLET BY MOUTH DAILY Patient taking differently: Take 145 mg by mouth at bedtime.  11/22/17  Yes Bhagat, Bhavinkumar, PA  metFORMIN (GLUCOPHAGE) 500 MG tablet Take 1 tablet (500 mg total) by mouth 2 (two) times daily with a meal. 04/21/17  Yes Isaiah Serge, NP  metoprolol succinate (TOPROL-XL) 50 MG 24 hr tablet TAKE ONE TABLET BY MOUTH DAILY IMMEDIATELY FOLLOWING A MEAL Patient taking differently: Take 25 mg by mouth at bedtime.  01/26/18  Yes End, Harrell Gave, MD  nitroGLYCERIN (NITROSTAT) 0.4 MG SL tablet Place  0.4 mg under the tongue every 5 (five) minutes as needed for chest pain.   Yes [provider]  pantoprazole (PROTONIX) 40 MG tablet Take 1 tablet (40 mg total) by mouth daily. Patient taking differently: Take 40 mg by mouth at bedtime.  05/03/17  Yes Bhagat, Bhavinkumar, PA  Potassium 99 MG TABS Take 99 mg by mouth at bedtime.   Yes [provider]  pravastatin (PRAVACHOL) 20 MG tablet Take 20 mg by mouth at bedtime.    Yes [provider]  Vitamin D3 (VITAMIN D) 25 MCG tablet Take 1,000 Units by mouth at bedtime.   Yes [provider]  colestipol (COLESTID) 1 g tablet TAKE ONE TABLET BY MOUTH TWICE DAILY Patient not taking: Reported on 02/11/2018 01/26/18   Mauri Pole, MD  methocarbamol (ROBAXIN) 500 MG tablet Take 1 tablet (500 mg total) by mouth at bedtime. Take for 7 days.  No alcohol on medication. Patient not taking: Reported on 02/11/2018  10/28/17   Willia Craze, NP  rifaximin (XIFAXAN) 550 MG TABS tablet Take 1 tablet (550 mg total) by mouth 3 (three) times daily. Take for 14 days Patient not taking: Reported on 01/12/2018 10/28/17   Willia Craze, NP    Family History Family History  Problem Relation Age of Onset  . Lung cancer Maternal Grandmother   . Stroke Father   . Hypertension Mother   . Heart attack Mother   . Heart disease Mother   . Hypertension Brother   . Heart disease Brother   . Hypertension Sister   . Heart disease Sister   . Coronary artery disease Brother   . Colon cancer Neg Hx   . Stomach cancer Neg Hx     Social History Social History   Tobacco Use  . Smoking status: Former Smoker    Packs/day: 1.00    Years: 35.00    Pack years: 35.00    Types: Cigarettes    Last attempt to quit: 05/31/2004    Years since quitting: 13.7  . Smokeless tobacco: Never Used  Substance Use Topics  . Alcohol use: No  . Drug use: No     Allergies   Lipitor [atorvastatin] and Ticagrelor   Review of Systems Review  of Systems  Constitutional: Negative for chills and fever.  HENT: Negative for ear pain and sore throat.   Eyes: Negative for pain and visual disturbance.  Respiratory: Negative for cough, hemoptysis, sputum production and shortness of breath.   Cardiovascular: Positive for chest pain. Negative for palpitations and PND.  Gastrointestinal: Negative for abdominal pain, nausea and vomiting.  Genitourinary: Negative for dysuria and hematuria.  Musculoskeletal: Negative for arthralgias and back pain.  Skin: Negative for color change and rash.  Neurological: Negative for dizziness, seizures, syncope and weakness.  All other systems reviewed and are negative.    Physical Exam Updated Vital Signs  ED Triage Vitals [02/11/18 1040]  Enc Vitals Group     BP (!) 142/69     Pulse Rate 67     Resp 20     Temp 98 F (36.7 C)     Temp Source Oral     SpO2 100 %     Weight      Height      Head Circumference      Peak Flow      Pain Score 0     Pain Loc      Pain Edu?      Excl. in [place]?     Physical Exam  Constitutional: She is oriented to person, place, and time. She appears well-developed and well-nourished. No distress.  HENT:  Head: Normocephalic and atraumatic.  Eyes: Pupils are equal, round, and reactive to light. Conjunctivae and EOM are normal.  Neck: Normal range of motion. Neck supple.  Cardiovascular: Normal rate, regular rhythm, intact distal pulses and normal pulses.  No murmur heard. Pulmonary/Chest: Effort normal and breath sounds normal. No respiratory distress. She has no decreased breath sounds.  Abdominal: Soft. There is no tenderness.  Musculoskeletal: She exhibits no edema.       Right lower leg: She exhibits no edema.       Left lower leg: She exhibits no edema.  Neurological: She is alert and oriented to person, place, and time.  Skin: Skin is warm and dry. Capillary refill takes less than 2 seconds.  Psychiatric: She has a normal mood and affect.  Nursing  note and vitals reviewed.  ED Treatments / Results  Labs (all labs ordered are listed, but only abnormal results are displayed) Labs Reviewed  BASIC METABOLIC PANEL - Abnormal; Notable for the following components:      Result Value   Glucose, Bld 118 (*)    All other components within normal limits  CBC  I-STAT TROPONIN, ED  I-STAT TROPONIN, ED    EKG EKG Interpretation  Date/Time:  Saturday February 11 2018 10:41:35 EDT Ventricular Rate:  67 PR Interval:  162 QRS Duration: 92 QT Interval:  426 QTC Calculation: 450 R Axis:   80 Text Interpretation:  Normal sinus rhythm Nonspecific T wave abnormality Abnormal ECG T wave in inferior leads/lateral  no longer inverted Confirmed by Lennice Sites [id number]) on 02/11/2018 11:58:12 AM   Radiology Dg Chest 2 View  Result Date: 02/11/2018 CLINICAL DATA:  Chest and right arm pain.  History of cardiac stent. EXAM: CHEST - 2 VIEW COMPARISON:  04/16/2017 FINDINGS: The cardiac silhouette, mediastinal and hilar contours are within normal limits and stable. Stable mild calcification involving the aortic arch. Left-sided coronary artery stent is noted. The lungs are clear. No infiltrates, edema or effusions. The bony thorax is intact. IMPRESSION: No acute cardiopulmonary findings. Electronically Signed   By: Marijo Sanes M.D.   On: 02/11/2018 11:41    Procedures Procedures (including critical care time)  Medications Ordered in ED Medications - No data to display   Initial Impression / Assessment and Plan / ED Course  I have reviewed the triage vital signs and the nursing notes.  Pertinent labs & imaging results that were available during my care of the patient were reviewed by me and considered in my medical decision making (see chart for details).     MIETTE MOLENDA is a 57 year old female history of reflux, CAD status post stent, hypertension, high cholesterol who presents to the ED with chest pain.  Patient with normal vitals.  No  fever.  Patient with symptoms that started this morning while she was working around the house.  Chest pain-free now after rest.  Feels as if she has had some reflux symptoms as well and has been without her Protonix.  Had her Protonix filled today.  Patient denies any shortness of breath, sputum production, cough.  Has clear breath sounds.  Overall exam is unremarkable.  EKG shows sinus rhythm.  No new ischemic changes.  T waves appear improved from prior EKG.  Had a heart cath last year that has single-vessel disease.  That was stented.  She has been on aspirin and Plavix since and has not missed any doses.  Patient with troponin x2 within normal limits.  No significant anemia, electrolyte abnormality, kidney injury.  Chest x-ray showed no signs of pneumonia, pneumothorax, pleural effusion.  Patient with no PE or DVT risk factors and doubt PE.  Contacted Dr. Arlyce Dice with cardiology and we discussed the patient and given work-up believe patient is safe for discharge to home at this time.  Patient given strict return precautions.  No concern for ACS at this time.  Told to return if she continues to have daily chest pain.  Could possibly be some reflux and patient to be started on her Protonix again.  Discharged from ED in good condition.  Patient happy with this plan and understands return precautions.  This chart was dictated using voice recognition software.  Despite best efforts to proofread,  errors can occur which can change the documentation meaning.   Final Clinical Impressions(s) / ED  Diagnoses   Final diagnoses:  Chest pain, unspecified type    ED Discharge Orders    None       Lennice Sites, DO 02/11/18 1428

## 2018-02-14 ENCOUNTER — Encounter: Payer: Self-pay | Admitting: Gastroenterology

## 2018-02-14 ENCOUNTER — Other Ambulatory Visit: Payer: BLUE CROSS/BLUE SHIELD

## 2018-02-14 ENCOUNTER — Ambulatory Visit
Admission: RE | Admit: 2018-02-14 | Discharge: 2018-02-14 | Disposition: A | Discharge status: Home / Self Care | Payer: BLUE CROSS/BLUE SHIELD | Source: Ambulatory Visit | Attending: Gastroenterology | Admitting: Gastroenterology

## 2018-02-14 ENCOUNTER — Telehealth: Payer: Self-pay | Admitting: Gastroenterology

## 2018-02-14 ENCOUNTER — Ambulatory Visit: Payer: BLUE CROSS/BLUE SHIELD | Admitting: Gastroenterology

## 2018-02-14 VITALS — BP 120/70 | HR 66 | Ht 63.0 in | Wt 144.6 lb

## 2018-02-14 DIAGNOSIS — K6289 Other specified diseases of anus and rectum: Secondary | ICD-10-CM

## 2018-02-14 DIAGNOSIS — K50919 Crohn's disease, unspecified, with unspecified complications: Secondary | ICD-10-CM | POA: Diagnosis not present

## 2018-02-14 DIAGNOSIS — Z8719 Personal history of other diseases of the digestive system: Secondary | ICD-10-CM

## 2018-02-14 MED ORDER — HYOSCYAMINE SULFATE SL 0.125 MG SL SUBL: SUBLINGUAL_TABLET | SUBLINGUAL | 0 refills | Status: DC

## 2018-02-14 MED ORDER — GADOBENATE DIMEGLUMINE 529 MG/ML IV SOLN
13.0000 mL | Freq: Once | INTRAVENOUS | Status: AC | PRN
  Administered 2018-02-14: 13 mL via INTRAVENOUS

## 2018-02-14 NOTE — Progress Notes (Signed)
02/14/2018 [name] [id number] 1961-03-28   HISTORY OF PRESENT ILLNESS:  This is a 57 year old female who is a a patient of Dr. Woodward Ku.  She has long-standing history of small bowel Crohn's disease, only on budesonide 3 mg daily currently for that.  She was added on to my schedule urgently today to evaluate rectal pain that began yesterday.  She says that she's had some lower abdominal pains for a little while, but the rectal pain just began suddenly yesterday.  Feels internal.  Denies bleeding.  Hurts to pass flatus and sends sharp pain radiating up from the rectum.  Afraid to have BM because of the pain.  Has some harder stools at times and other times they are loose.   Past Medical History:  Diagnosis Date  . Allergic rhinitis due to pollen   . Anxiety state, unspecified   . Arthritis   . Bronchitis    hx  . Calculus of gallbladder without mention of cholecystitis or obstruction   . Crohn disease ([place])   . Depressive disorder, not elsewhere classified   . Diabetes mellitus without complication (HCC)    diet controlled  . Esophageal reflux   . Fibromyalgia   . Headache   . History of nuclear stress test    ETT-Myoview 10/17: EF 55%, normal perfusion; Low Risk  . Myalgia and myositis, unspecified   . NSTEMI (non-ST elevated myocardial infarction) ([place]) 04/15/2017  . Other and unspecified hyperlipidemia   . Palpitations   . Pneumonia 15   hx  . S/P angioplasty with stent 04/18/17 with DES to pLAD 04/19/2017  . Unspecified essential hypertension   . Vitamin B deficiency    Past Surgical History:  Procedure Laterality Date  . CHOLECYSTECTOMY N/A 09/19/2014   Procedure: LAPAROSCOPIC CHOLECYSTECTOMY WITH INTRAOPERATIVE CHOLANGIOGRAM;  Surgeon: Donnie Mesa, MD;  Location: [facility];  Service: General;  Laterality: N/A;  . CORONARY STENT INTERVENTION N/A 04/18/2017   Procedure: CORONARY STENT INTERVENTION;  Surgeon: Martinique, Peter M, MD;  Location: [facility];   Service: Cardiovascular;  Laterality: N/A;  . ESOPHAGOGASTRODUODENOSCOPY    . EXPLORATORY LAPAROTOMY     for endometriosis  . INTRAVASCULAR PRESSURE WIRE/FFR STUDY N/A 04/18/2017   Procedure: INTRAVASCULAR PRESSURE WIRE/FFR STUDY;  Surgeon: Martinique, Peter M, MD;  Location: [facility];  Service: Cardiovascular;  Laterality: N/A;  . LEFT HEART CATH AND CORONARY ANGIOGRAPHY N/A 04/18/2017   Procedure: LEFT HEART CATH AND CORONARY ANGIOGRAPHY;  Surgeon: Martinique, Peter M, MD;  Location: [facility];  Service: Cardiovascular;  Laterality: N/A;  . TONSILLECTOMY AND ADENOIDECTOMY    . TUBAL LIGATION     BILATERAL    reports that she quit smoking about 13 years ago. Her smoking use included cigarettes. She has a 35.00 pack-year smoking history. She has never used smokeless tobacco. She reports that she does not drink alcohol or use drugs. family history includes Coronary artery disease in her brother; Heart attack in her mother; Heart disease in her brother, mother, and sister; Hypertension in her brother, mother, and sister; Lung cancer in her maternal grandmother; Stroke in her father. Allergies  Allergen Reactions  . Lipitor [Atorvastatin] Other (See Comments)    Elevated ALT  . Ticagrelor     Shortness of breath      Outpatient Encounter Medications as of 02/14/2018  Medication Sig  . albuterol (PROVENTIL) (2.5 MG/3ML) 0.083% nebulizer solution Take 2.5 mg by nebulization every 4 (four) hours as  needed.   . Ascorbic Acid (VITAMIN C) 1000 MG tablet Take 1,000 mg by mouth at bedtime.  Marland Kitchen aspirin 81 MG chewable tablet Chew 81 mg by mouth daily.    . budesonide (ENTOCORT EC) 3 MG 24 hr capsule TAKE 3 CAPSULES BY MOUTH ONCE DAILY (Patient taking differently: Take 9 mg by mouth at bedtime. )  . clopidogrel (PLAVIX) 75 MG tablet Take 1 tablet (75 mg total) by mouth daily.  . DULoxetine (CYMBALTA) 60 MG capsule Take 60 mg by mouth daily.  . enalapril (VASOTEC) 5 MG tablet Take 1 tablet  (5 mg total) by mouth daily.  Marland Kitchen EPIPEN 2-PAK 0.3 MG/0.3ML SOAJ injection Reported on 06/27/2015  . fenofibrate (TRICOR) 145 MG tablet TAKE ONE TABLET BY MOUTH DAILY (Patient taking differently: Take 145 mg by mouth at bedtime. )  . metFORMIN (GLUCOPHAGE) 500 MG tablet Take 1 tablet (500 mg total) by mouth 2 (two) times daily with a meal.  . metoprolol succinate (TOPROL-XL) 50 MG 24 hr tablet TAKE ONE TABLET BY MOUTH DAILY IMMEDIATELY FOLLOWING A MEAL (Patient taking differently: Take 25 mg by mouth at bedtime. )  . nitroGLYCERIN (NITROSTAT) 0.4 MG SL tablet Place 0.4 mg under the tongue every 5 (five) minutes as needed for chest pain.  . pantoprazole (PROTONIX) 40 MG tablet Take 1 tablet (40 mg total) by mouth daily. (Patient taking differently: Take 40 mg by mouth at bedtime. )  . Potassium 99 MG TABS Take 99 mg by mouth at bedtime.  . pravastatin (PRAVACHOL) 20 MG tablet Take 20 mg by mouth at bedtime.   . rifaximin (XIFAXAN) 550 MG TABS tablet Take 1 tablet (550 mg total) by mouth 3 (three) times daily. Take for 14 days  . Vitamin D3 (VITAMIN D) 25 MCG tablet Take 1,000 Units by mouth at bedtime.  . [DISCONTINUED] colestipol (COLESTID) 1 g tablet TAKE ONE TABLET BY MOUTH TWICE DAILY (Patient not taking: Reported on 02/11/2018)  . [DISCONTINUED] methocarbamol (ROBAXIN) 500 MG tablet Take 1 tablet (500 mg total) by mouth at bedtime. Take for 7 days.  No alcohol on medication. (Patient not taking: Reported on 02/14/2018)   Facility-Administered Encounter Medications as of 02/14/2018  Medication  . nitroGLYCERIN (NITROSTAT) SL tablet 0.3 mg     REVIEW OF SYSTEMS  : All other systems reviewed and negative except where noted in the History of Present Illness.   PHYSICAL EXAM: BP 120/70   Pulse 66   Ht 5' 3"  (1.6 m)   Wt 144 lb 9.6 oz (65.6 kg)   LMP  (LMP Unknown)   SpO2 96%   BMI 25.61 kg/m  General: Well developed white female in no acute distress Head: Normocephalic and atraumatic Eyes:   Sclerae anicteric, conjunctiva pink. Ears: Normal auditory acuity Lungs: Clear throughout to auscultation; no increased WOB. Heart: Regular rate and rhythm Abdomen: Soft, non-distended.  BS present.  Non-tender. Rectal:  No perianal tenderness noted.  No external abnormalities noted.  DRE revealed mild posterior tenderness.  Anoscopy performed and tolerated well; there was some stool in rectum that obscured view somewhat, but there was no definite fissure or other pain causing abnormality noted.   Musculoskeletal: Symmetrical with no gross deformities  Skin: No lesions on visible extremities Extremities: No edema  Neurological: Alert oriented x 4, grossly non-focal Psychological:  Alert and cooperative. Normal mood and affect  ASSESSMENT AND PLAN: *56 year old female with complaints of rectal pain and lower abdominal discomfort.  Rectal pain began suddenly yesterday.  History sounds like it could be a fissure but no definite sign of a fissure or other pain causing abnormality seen on exam today.  Has Crohn's disease only on budesonide 3 mg daily at this time.  Will check an MRI of the pelvis to rule out abscess or other Crohn's related abnormalities.  If negative then will treat empirically for a fissure.  For now will give levsin 0.125 mg to use every 6 hours prn.     **Will need an appt with Dr. Silverio Decamp in the near future to determine long-term treatment of her Crohn's disease.  CC:  Hamrick, Lorin Mercy, MD

## 2018-02-14 NOTE — Telephone Encounter (Signed)
Sudden onset since yesterday of rectal pain. Passing gas hurts and sends sharp pain radiating from the rectum up.The pain "spreads out from there. Has not seen any discharge from the rectal area. She had a harder than normal stool "the other day." Deneis any blood or mucous with bowel movement. Afraid to have a bowel movement today because she know "it will make me squeal." Agrees to come in for evaluation.

## 2018-02-14 NOTE — Telephone Encounter (Signed)
Patient states she is having some rectal pain and is requesting to speak with nurse [facility] about options.

## 2018-02-14 NOTE — Patient Instructions (Addendum)
We sent a prescription for Levsin SL for spasms to your pharmacy.  [place].   We have scheduled the MRI of the Pelvis at [address]. Have your photo Id and insurance card.  Arrive at 2:10 for a 2:30 Pm.  You do not have to hold food or drink.   Normal BMI (Body Mass Index- based on height and weight) is between 19 and 25. Your BMI today is Body mass index is 25.61 kg/m. Marland Kitchen Please consider follow up  regarding your BMI with your Primary Care Provider.

## 2018-02-16 ENCOUNTER — Ambulatory Visit: Payer: BLUE CROSS/BLUE SHIELD | Admitting: Nurse Practitioner

## 2018-02-16 ENCOUNTER — Telehealth: Payer: Self-pay | Admitting: Gastroenterology

## 2018-02-16 NOTE — Telephone Encounter (Signed)
See result note.  

## 2018-02-16 NOTE — Telephone Encounter (Signed)
Patient wanting to know if results from MRI on 9.17.19 are back.

## 2018-02-17 ENCOUNTER — Other Ambulatory Visit: Payer: Self-pay

## 2018-02-17 MED ORDER — PREDNISONE 20 MG PO TABS: 20.0000 mg | ORAL_TABLET | Freq: Every day | ORAL | 0 refills | Status: DC

## 2018-02-20 ENCOUNTER — Telehealth: Payer: Self-pay | Admitting: Gastroenterology

## 2018-02-20 ENCOUNTER — Encounter: Payer: Self-pay | Admitting: Gastroenterology

## 2018-02-20 ENCOUNTER — Other Ambulatory Visit: Payer: Self-pay

## 2018-02-20 ENCOUNTER — Other Ambulatory Visit (INDEPENDENT_AMBULATORY_CARE_PROVIDER_SITE_OTHER): Payer: BLUE CROSS/BLUE SHIELD

## 2018-02-20 ENCOUNTER — Ambulatory Visit: Payer: BLUE CROSS/BLUE SHIELD | Admitting: Gastroenterology

## 2018-02-20 VITALS — BP 104/68 | HR 66 | Ht 63.0 in | Wt 142.5 lb

## 2018-02-20 DIAGNOSIS — K50118 Crohn's disease of large intestine with other complication: Secondary | ICD-10-CM

## 2018-02-20 DIAGNOSIS — K50813 Crohn's disease of both small and large intestine with fistula: Secondary | ICD-10-CM

## 2018-02-20 DIAGNOSIS — K603 Anal fistula: Secondary | ICD-10-CM

## 2018-02-20 DIAGNOSIS — K6289 Other specified diseases of anus and rectum: Secondary | ICD-10-CM

## 2018-02-20 LAB — CBC WITH DIFFERENTIAL/PLATELET
BASOS PCT: 0.7 % (ref 0.0–3.0)
Basophils Absolute: 0.1 10*3/uL (ref 0.0–0.1)
Eosinophils Absolute: 0.1 10*3/uL (ref 0.0–0.7)
Eosinophils Relative: 0.7 % (ref 0.0–5.0)
HEMATOCRIT: 42 % (ref 36.0–46.0)
HEMOGLOBIN: 14.2 g/dL (ref 12.0–15.0)
Lymphocytes Relative: 22.4 % (ref 12.0–46.0)
Lymphs Abs: 2 10*3/uL (ref 0.7–4.0)
MCHC: 33.7 g/dL (ref 30.0–36.0)
MCV: 91.6 fl (ref 78.0–100.0)
MONO ABS: 0.5 10*3/uL (ref 0.1–1.0)
MONOS PCT: 6.3 % (ref 3.0–12.0)
Neutro Abs: 6.1 10*3/uL (ref 1.4–7.7)
Neutrophils Relative %: 69.9 % (ref 43.0–77.0)
Platelets: 321 10*3/uL (ref 150.0–400.0)
RBC: 4.58 Mil/uL (ref 3.87–5.11)
RDW: 13.3 % (ref 11.5–15.5)
WBC: 8.7 10*3/uL (ref 4.0–10.5)

## 2018-02-20 LAB — COMPREHENSIVE METABOLIC PANEL
ALK PHOS: 82 U/L (ref 39–117)
ALT: 75 U/L — AB (ref 0–35)
AST: 61 U/L — ABNORMAL HIGH (ref 0–37)
Albumin: 4.4 g/dL (ref 3.5–5.2)
BILIRUBIN TOTAL: 0.4 mg/dL (ref 0.2–1.2)
BUN: 19 mg/dL (ref 6–23)
CO2: 30 mEq/L (ref 19–32)
Calcium: 9.9 mg/dL (ref 8.4–10.5)
Chloride: 100 mEq/L (ref 96–112)
Creatinine, Ser: 0.69 mg/dL (ref 0.40–1.20)
GFR: 92.34 mL/min (ref >60.00)
GLUCOSE: 213 mg/dL — AB (ref 70–99)
Potassium: 4.9 mEq/L (ref 3.5–5.1)
Sodium: 139 mEq/L (ref 135–145)
TOTAL PROTEIN: 6.9 g/dL (ref 6.0–8.3)

## 2018-02-20 LAB — IBC PANEL
IRON: 120 ug/dL (ref 42–145)
Saturation Ratios: 25.7 % (ref 20.0–50.0)
Transferrin: 334 mg/dL (ref 212.0–360.0)

## 2018-02-20 LAB — VITAMIN B12: VITAMIN B 12: 317 pg/mL (ref 211–911)

## 2018-02-20 LAB — SEDIMENTATION RATE: SED RATE: 11 mm/h (ref 0–30)

## 2018-02-20 LAB — FOLATE

## 2018-02-20 LAB — FERRITIN: FERRITIN: 62.5 ng/mL (ref 10.0–291.0)

## 2018-02-20 LAB — C-REACTIVE PROTEIN: CRP: 1 mg/dL (ref 0.5–20.0)

## 2018-02-20 MED ORDER — MESALAMINE 1000 MG RE SUPP: 1000.0000 mg | Freq: Every day | RECTAL | 0 refills | Status: DC

## 2018-02-20 MED ORDER — BUDESONIDE 3 MG PO CPEP: 9.0000 mg | ORAL_CAPSULE | Freq: Every day | ORAL | 2 refills | Status: DC

## 2018-02-20 NOTE — Progress Notes (Addendum)
[name]    [id number]    08/20/1960  Primary Care Physician:Hamrick, Lorin Mercy, MD  Referring Physician: Leonides Sake, [address], [place] [address]  Chief complaint: Crohn's disease HPI:  57 year old female with history of long-standing Crohn's disease by Alonza Bogus on February 14, 2018 with complaints of rectal pain. MRI pelvis February 14, 2018 showed grade 1 simple linear intersphincteric perianal fistula without associated abscess.  Mild segmental wall thickening and luminal narrowing in the distal ileum suggestive of mild active Crohn's ileitis.  Mild chronic wall thickening in the terminal ileum without active inflammatory changes.  Patient was sent a prescription for prednisone, she has not started it as she is worried about potential side effects.  She did not feel well when she took it many years ago when she was initially diagnosed with Crohn's.  She continues to have intermittent rectal pain, worse when she has a bowel movement.  Denies any rectal bleeding.  She is having semi-formed bowel movement about once daily.  She has nausea and mid abdomen discomfort.  No dysphagia, vomiting, loss of appetite or weight loss.  Patient was in clinical remission on 6-MP until 2017.  6-MP was discontinued due to persistently elevated transaminases.  She developed C. difficile colitis and Crohn's flare.  Was initiated on Humira, had persistent symptoms, noted to have undetectable drug level with elevated antibodies.  She was maintained on budesonide 9 mg daily, taper down to 3 mg daily for past year.  Relevant medical history includes an STEMI status post coronary stent on Plavix   Outpatient Encounter Medications as of 02/20/2018  Medication Sig  . albuterol (PROVENTIL) (2.5 MG/3ML) 0.083% nebulizer solution Take 2.5 mg by nebulization every 4 (four) hours as needed.   . Ascorbic Acid (VITAMIN C) 1000 MG tablet Take 1,000 mg by mouth at bedtime.  Marland Kitchen  aspirin 81 MG chewable tablet Chew 81 mg by mouth daily.    . budesonide (ENTOCORT EC) 3 MG 24 hr capsule TAKE 3 CAPSULES BY MOUTH ONCE DAILY (Patient taking differently: Take 9 mg by mouth at bedtime. )  . clopidogrel (PLAVIX) 75 MG tablet Take 1 tablet (75 mg total) by mouth daily.  . DULoxetine (CYMBALTA) 60 MG capsule Take 60 mg by mouth daily.  . enalapril (VASOTEC) 5 MG tablet Take 1 tablet (5 mg total) by mouth daily.  Marland Kitchen EPIPEN 2-PAK 0.3 MG/0.3ML SOAJ injection Reported on 06/27/2015  . fenofibrate (TRICOR) 145 MG tablet TAKE ONE TABLET BY MOUTH DAILY (Patient taking differently: Take 145 mg by mouth at bedtime. )  . Hyoscyamine Sulfate SL (LEVSIN/SL) 0.125 MG SUBL Dissolve one tablet on the tongue every 6 hours for spasms.  . metFORMIN (GLUCOPHAGE) 500 MG tablet Take 1 tablet (500 mg total) by mouth 2 (two) times daily with a meal.  . metoprolol succinate (TOPROL-XL) 50 MG 24 hr tablet TAKE ONE TABLET BY MOUTH DAILY IMMEDIATELY FOLLOWING A MEAL (Patient taking differently: Take 25 mg by mouth at bedtime. )  . nitroGLYCERIN (NITROSTAT) 0.4 MG SL tablet Place 0.4 mg under the tongue every 5 (five) minutes as needed for chest pain.  . pantoprazole (PROTONIX) 40 MG tablet Take 1 tablet (40 mg total) by mouth daily. (Patient taking differently: Take 40 mg by mouth at bedtime. )  . Potassium 99 MG TABS Take 99 mg by mouth at bedtime.  . pravastatin (PRAVACHOL) 20 MG tablet Take 20 mg by mouth  at bedtime.   . predniSONE (DELTASONE) 20 MG tablet Take 1 tablet (20 mg total) by mouth daily with breakfast.  . rifaximin (XIFAXAN) 550 MG TABS tablet Take 1 tablet (550 mg total) by mouth 3 (three) times daily. Take for 14 days  . Vitamin D3 (VITAMIN D) 25 MCG tablet Take 1,000 Units by mouth at bedtime.   Facility-Administered Encounter Medications as of 02/20/2018  Medication  . nitroGLYCERIN (NITROSTAT) SL tablet 0.3 mg    Allergies as of 02/20/2018 - Review Complete 02/14/2018  Allergen Reaction  Noted  . Lipitor [atorvastatin] Other (See Comments) 03/28/2013  . Ticagrelor  09/16/2017    Past Medical History:  Diagnosis Date  . Allergic rhinitis due to pollen   . Anxiety state, unspecified   . Arthritis   . Bronchitis    hx  . Calculus of gallbladder without mention of cholecystitis or obstruction   . Crohn disease ([place])   . Depressive disorder, not elsewhere classified   . Diabetes mellitus without complication (HCC)    diet controlled  . Esophageal reflux   . Fibromyalgia   . Headache   . History of nuclear stress test    ETT-Myoview 10/17: EF 55%, normal perfusion; Low Risk  . Myalgia and myositis, unspecified   . NSTEMI (non-ST elevated myocardial infarction) ([place]) 04/15/2017  . Other and unspecified hyperlipidemia   . Palpitations   . Pneumonia 15   hx  . S/P angioplasty with stent 04/18/17 with DES to pLAD 04/19/2017  . Unspecified essential hypertension   . Vitamin B deficiency     Past Surgical History:  Procedure Laterality Date  . CHOLECYSTECTOMY N/A 09/19/2014   Procedure: LAPAROSCOPIC CHOLECYSTECTOMY WITH INTRAOPERATIVE CHOLANGIOGRAM;  Surgeon: Donnie Mesa, MD;  Location: [facility];  Service: General;  Laterality: N/A;  . CORONARY STENT INTERVENTION N/A 04/18/2017   Procedure: CORONARY STENT INTERVENTION;  Surgeon: Martinique, Peter M, MD;  Location: [facility];  Service: Cardiovascular;  Laterality: N/A;  . ESOPHAGOGASTRODUODENOSCOPY    . EXPLORATORY LAPAROTOMY     for endometriosis  . INTRAVASCULAR PRESSURE WIRE/FFR STUDY N/A 04/18/2017   Procedure: INTRAVASCULAR PRESSURE WIRE/FFR STUDY;  Surgeon: Martinique, Peter M, MD;  Location: [facility];  Service: Cardiovascular;  Laterality: N/A;  . LEFT HEART CATH AND CORONARY ANGIOGRAPHY N/A 04/18/2017   Procedure: LEFT HEART CATH AND CORONARY ANGIOGRAPHY;  Surgeon: Martinique, Peter M, MD;  Location: [facility];  Service: Cardiovascular;  Laterality: N/A;  . TONSILLECTOMY AND ADENOIDECTOMY    .  TUBAL LIGATION     BILATERAL    Family History  Problem Relation Age of Onset  . Lung cancer Maternal Grandmother   . Stroke Father   . Hypertension Mother   . Heart attack Mother   . Heart disease Mother   . Hypertension Brother   . Heart disease Brother   . Hypertension Sister   . Heart disease Sister   . Coronary artery disease Brother   . Colon cancer Neg Hx   . Stomach cancer Neg Hx     Social History   Socioeconomic History  . Marital status: Married    Spouse name: Dane  . Number of children: 2  . Years of education: 12+  . Highest education level: Not on file  Occupational History  . Occupation: MACHINE OPERATOR    Employer: [employer]  . Occupation: ACCOUNT REP.    Employer: [employer]  Social Needs  . Financial resource strain: Not on  file  . Food insecurity:    Worry: Not on file    Inability: Not on file  . Transportation needs:    Medical: Not on file    Non-medical: Not on file  Tobacco Use  . Smoking status: Former Smoker    Packs/day: 1.00    Years: 35.00    Pack years: 35.00    Types: Cigarettes    Last attempt to quit: 05/31/2004    Years since quitting: 13.7  . Smokeless tobacco: Never Used  Substance and Sexual Activity  . Alcohol use: No  . Drug use: No  . Sexual activity: Never    Comment: married  Lifestyle  . Physical activity:    Days per week: Not on file    Minutes per session: Not on file  . Stress: Not on file  Relationships  . Social connections:    Talks on phone: Not on file    Gets together: Not on file    Attends religious service: Not on file    Active member of club or organization: Not on file    Attends meetings of clubs or organizations: Not on file    Relationship status: Not on file  . Intimate partner violence:    Fear of current or ex partner: Not on file    Emotionally abused: Not on file    Physically abused: Not on file    Forced sexual activity: Not on file  Other  Topics Concern  . Not on file  Social History Narrative   Lives with her husband and their pets. Her children are adults and live independently.      Review of systems: Review of Systems  Constitutional: Negative for fever and chills.  Positive for fatigue and lack of energy. HENT: Negative.   Eyes: Negative for blurred vision.  Respiratory: Negative for cough, shortness of breath and wheezing.   Cardiovascular: Negative for chest pain and palpitations.  Gastrointestinal: as per HPI Genitourinary: Negative for dysuria, urgency, frequency and hematuria.  Musculoskeletal: Positive for myalgias, back pain and joint pain.  Skin: Negative for itching and rash.  Neurological: Negative for dizziness, tremors, focal weakness, seizures and loss of consciousness.  Positive for frequent headaches Endo/Heme/Allergies: Positive for seasonal allergies.  Psychiatric/Behavioral: Negative for depression, suicidal ideas and hallucinations.  All other systems reviewed and are negative.   Physical Exam: Vitals:   02/20/18 1050  BP: 104/68  Pulse: 66   Body mass index is 25.24 kg/m. Gen:      No acute distress HEENT:  EOMI, sclera anicteric Neck:     No masses; no thyromegaly Lungs:    Clear to auscultation bilaterally; normal respiratory effort CV:         Regular rate and rhythm; no murmurs Abd:      + bowel sounds; soft, non-tender; no palpable masses, no distension Ext:    No edema; adequate peripheral perfusion Skin:      Warm and dry; no rash Neuro: alert and oriented x 3 Psych: normal mood and affect Rectal exam: Normal anal sphincter tone, no anal fissure or external hemorrhoids Anoscopy: Small internal hemorrhoids, no active bleeding, normal dentate line, no visible nodules  Data Reviewed:  Reviewed labs, radiology imaging, old records and pertinent past GI work up   Assessment and Plan/Recommendations: 57 year old female with history of Crohn's disease predominantly of small  bowel involvement diagnosed in 1991 here with complaints of rectal pain, abdominal discomfort and nausea. MRI pelvis showed evidence of small  intersphincteric fistula with no perianal abscess.  Active inflammation in the distal ileum concerning for active Crohn's flare Patient is reluctant to start prednisone and worried about potential side effects Will increase the dose of budesonide to 9 mg daily Plan to initiate biologic immunosuppressive therapy, will obtain insurance approval for Entyvio. (Has failed Humira in the past with elevated antibody level and undetectable drug trough).  Persistently elevated transaminases with 6-MP, improved after discontinuing it. Canasa suppository per rectum at bedtime for 14 days Check CBC, CMP, ESR/CRP, B12, folate and iron panel Advised patient to get flu shot Monthly B12 injections IM 1000 mcg Return in 1 month or sooner if needed Consider repeating MRI in 2 to 3 months after initiating Entyvio to document healing of anal fistula  Greater than 50% of the time used for counseling as well as treatment plan and follow-up. She had multiple questions which were answered to her satisfaction  K. Denzil Magnuson , MD [phone]    CC: Hamrick, Lorin Mercy, MD

## 2018-02-20 NOTE — Patient Instructions (Addendum)
Your provider has requested that you go to the basement level for lab work before leaving today. Press "B" on the elevator. The lab is located at the first door on the left as you exit the elevator.  We have sent refills of Budesonide to your pharmacy  We have sent canasa suppositories to your pharmacy to use for 14 days  We will do an approval for [facility] for you and contact you when available  If you are age 59 or older, your body mass index should be between 23-30. Your Body mass index is 25.24 kg/m. If this is out of the aforementioned range listed, please consider follow up with your Primary Care Provider.  If you are age 49 or younger, your body mass index should be between 19-25. Your Body mass index is 25.24 kg/m. If this is out of the aformentioned range listed, please consider follow up with your Primary Care Provider.    Thank you for choosing [facility]  Karleen Hampshire Nandigam,MD

## 2018-02-20 NOTE — Telephone Encounter (Signed)
Please advise Dr Silverio Decamp

## 2018-02-20 NOTE — Telephone Encounter (Signed)
Left message for pt to return my call if she needs her B12 sent to her pharmacy and if she needs her injections done in the office monthly

## 2018-02-20 NOTE — Telephone Encounter (Signed)
She will need 1000 micrograms injection of vitamin B12 every month.  Thanks

## 2018-02-21 ENCOUNTER — Other Ambulatory Visit: Payer: Self-pay

## 2018-02-21 DIAGNOSIS — K50813 Crohn's disease of both small and large intestine with fistula: Secondary | ICD-10-CM

## 2018-02-21 MED ORDER — VEDOLIZUMAB 300 MG IV SOLR: 300.0000 mg | INTRAVENOUS | 6 refills | Status: DC

## 2018-02-23 NOTE — Progress Notes (Signed)
Reviewed and agree with documentation and assessment and plan. K. Veena Evadene Wardrip , MD   

## 2018-03-01 ENCOUNTER — Telehealth: Payer: Self-pay | Admitting: Gastroenterology

## 2018-03-01 NOTE — Telephone Encounter (Signed)
Faxed to PCP per patient request.

## 2018-03-07 ENCOUNTER — Ambulatory Visit: Payer: BLUE CROSS/BLUE SHIELD | Admitting: Gastroenterology

## 2018-03-10 ENCOUNTER — Ambulatory Visit: Payer: BLUE CROSS/BLUE SHIELD | Admitting: Nurse Practitioner

## 2018-03-10 NOTE — Telephone Encounter (Signed)
Patient never returned call  

## 2018-03-14 ENCOUNTER — Encounter: Payer: Self-pay | Admitting: Internal Medicine

## 2018-03-15 ENCOUNTER — Telehealth: Payer: Self-pay | Admitting: *Deleted

## 2018-03-15 NOTE — Telephone Encounter (Signed)
-----   Message from Nelva Bush, MD sent at 03/14/2018  6:35 PM EDT ----- Please let [name] know that I have reviewed her outside labs.  LDL is at goal, though triglycerides are elevated.  She still has a persistent mild transaminitis.  I recommend that she continue her current medications for now, though lipid therapy may need to be adjusted due to her abnormal LFTs.  I recommend that she discuss this with her gastroenterologist to determine if statin needs to be held or additional testing is indicated.  The patient should follow-up as planned in our office early next month.

## 2018-03-15 NOTE — Telephone Encounter (Signed)
No answer. Left message to call back.   

## 2018-03-16 DIAGNOSIS — K50813 Crohn's disease of both small and large intestine with fistula: Secondary | ICD-10-CM | POA: Diagnosis not present

## 2018-03-16 NOTE — Telephone Encounter (Signed)
Results called to pt. Pt verbalized understanding of results and plan of care.

## 2018-03-16 NOTE — Telephone Encounter (Signed)
Patient returning call.

## 2018-03-24 DIAGNOSIS — E119 Type 2 diabetes mellitus without complications: Secondary | ICD-10-CM | POA: Diagnosis not present

## 2018-03-30 DIAGNOSIS — Z79899 Other long term (current) drug therapy: Secondary | ICD-10-CM | POA: Diagnosis not present

## 2018-03-30 DIAGNOSIS — K50813 Crohn's disease of both small and large intestine with fistula: Secondary | ICD-10-CM | POA: Diagnosis not present

## 2018-04-03 ENCOUNTER — Telehealth: Payer: Self-pay | Admitting: *Deleted

## 2018-04-03 ENCOUNTER — Ambulatory Visit (INDEPENDENT_AMBULATORY_CARE_PROVIDER_SITE_OTHER): Payer: BLUE CROSS/BLUE SHIELD | Admitting: Gastroenterology

## 2018-04-03 ENCOUNTER — Encounter: Payer: Self-pay | Admitting: Gastroenterology

## 2018-04-03 VITALS — BP 118/70 | HR 72 | Ht 63.0 in | Wt 145.0 lb

## 2018-04-03 DIAGNOSIS — K50813 Crohn's disease of both small and large intestine with fistula: Secondary | ICD-10-CM

## 2018-04-03 DIAGNOSIS — E538 Deficiency of other specified B group vitamins: Secondary | ICD-10-CM

## 2018-04-03 MED ORDER — NA SULFATE-K SULFATE-MG SULF 17.5-3.13-1.6 GM/177ML PO SOLN: 1.0000 | Freq: Once | ORAL | 0 refills | Status: AC

## 2018-04-03 MED ORDER — CYANOCOBALAMIN 1000 MCG/ML IJ SOLN: 1000.0000 ug | Freq: Once | INTRAMUSCULAR | 12 refills | Status: AC

## 2018-04-03 NOTE — Telephone Encounter (Signed)
[facility] Pre-operative Risk Assessment     Request for surgical clearance:     Endoscopy Procedure  What type of surgery is being performed?     Colonoscopy  When is this surgery scheduled?     05/10/2018  What type of clearance is required ?   Pharmacy  Are there any medications that need to be held prior to surgery and how long? Plavix   Practice name and name of physician performing surgery?      [facility]  What is your office phone and fax number?      Phone- [phone]  [phone]  Anesthesia type (None, local, MAC, general) ?       MAC

## 2018-04-03 NOTE — Patient Instructions (Addendum)
You have been scheduled for a colonoscopy. Please follow written instructions given to you at your visit today.  Please pick up your prep supplies at the pharmacy within the next 1-3 days. If you use inhalers (even only as needed), please bring them with you on the day of your procedure.  You will be contacted by our office prior to your procedure for directions on holding your Plavix.  If you do not hear from our office 1 week prior to your scheduled procedure, please call [phone] to discuss.   You will need B12 injections every week for 4 weeks then once monthly, we have printed you a prescription for you to carry to your PCP as requested   We need to obtain your last labs results from your primary care office if you could have them fax results Marijean Heath at [phone]  Follow up labs in December   Thank you for choosing [facility]  Karleen Hampshire Nandigam,MD

## 2018-04-03 NOTE — Progress Notes (Signed)
CAYCI MCNABB    [id number]    1960-07-25  Primary Care Physician:Hamrick, Lorin Mercy, MD  Referring Physician: Leonides Sake, [place], [place] [address]  Chief complaint: Crohn's disease with perianal fistula HPI: 57 year old female with long-standing history of Crohn's disease here for follow-up visit. Patient completed first 2 induction doses of Entyvio, next dose due in 6 weeks.  She is doing well so far.  She continues to have intermittent abdominal bloating and discomfort.  She has no energy level, has chronic fatigue.  She is getting B12 injections every month.  Denies any diarrhea or rectal bleeding.  No nausea or vomiting.  She is taking amoxicillin for sore throat with tonsillar enlargement.  Denies any fever or hoarseness of voice.  She had ST elevation MI in November 2018, status post coronary stent placement and is currently on Plavix   MRI pelvis February 14, 2018 showed grade 1 simple linear intersphincteric perianal fistula without associated abscess.  Mild segmental wall thickening and luminal narrowing in the distal ileum suggestive of mild active Crohn's ileitis.  Mild chronic wall thickening in the terminal ileum without active inflammatory changes.  Relevant GI history: Crohn's disease with predominant small bowel involvement diagnosed in 1991.  Initially was managed with prednisone, had significant side effects.  Subsequently was in clinical remission on 6-MP.  6-MP was discontinued in 2017 due to persistently elevated transaminases, have improved after discontinuing 6-MP.  She was started on Humira, clinically failed with persistent symptoms.  Had undetectable drug trough with elevated antibodies.  Started on budesonide 9 mg daily and taper down to 3 mg daily in 2018.  Developed rectal pain and noted to have perianal fistula on MRI pelvis September 2019.  Outpatient Encounter Medications as of 04/03/2018  Medication Sig  . albuterol  (PROVENTIL) (2.5 MG/3ML) 0.083% nebulizer solution Take 2.5 mg by nebulization every 4 (four) hours as needed.   Marland Kitchen amoxicillin (AMOXIL) 875 MG tablet Take 875 mg by mouth 2 (two) times daily.  . Ascorbic Acid (VITAMIN C) 1000 MG tablet Take 1,000 mg by mouth at bedtime.  Marland Kitchen aspirin 81 MG chewable tablet Chew 81 mg by mouth daily.    . budesonide (ENTOCORT EC) 3 MG 24 hr capsule Take 3 capsules (9 mg total) by mouth daily.  . clopidogrel (PLAVIX) 75 MG tablet Take 1 tablet (75 mg total) by mouth daily.  . DULoxetine (CYMBALTA) 60 MG capsule Take 60 mg by mouth daily.  . enalapril (VASOTEC) 5 MG tablet Take 1 tablet (5 mg total) by mouth daily.  Marland Kitchen EPIPEN 2-PAK 0.3 MG/0.3ML SOAJ injection Reported on 06/27/2015  . fenofibrate (TRICOR) 145 MG tablet TAKE ONE TABLET BY MOUTH DAILY (Patient taking differently: Take 145 mg by mouth at bedtime. )  . metFORMIN (GLUCOPHAGE) 500 MG tablet Take 1 tablet (500 mg total) by mouth 2 (two) times daily with a meal.  . metoprolol succinate (TOPROL-XL) 50 MG 24 hr tablet TAKE ONE TABLET BY MOUTH DAILY IMMEDIATELY FOLLOWING A MEAL (Patient taking differently: Take 25 mg by mouth at bedtime. )  . nitroGLYCERIN (NITROSTAT) 0.4 MG SL tablet Place 0.4 mg under the tongue every 5 (five) minutes as needed for chest pain.  Marland Kitchen Potassium 99 MG TABS Take 99 mg by mouth at bedtime.  . pravastatin (PRAVACHOL) 20 MG tablet Take 20 mg by mouth at bedtime.   . rifaximin (XIFAXAN) 550 MG TABS tablet Take 1 tablet (  550 mg total) by mouth 3 (three) times daily. Take for 14 days  . vedolizumab (ENTYVIO) 300 MG injection Inject 300 mg into the vein every 8 (eight) weeks. After the standard induction  . Vitamin D3 (VITAMIN D) 25 MCG tablet Take 1,000 Units by mouth at bedtime.  . cyanocobalamin (,VITAMIN B-12,) 1000 MCG/ML injection Inject 1 mL (1,000 mcg total) into the muscle once for 1 dose.  . Na Sulfate-K Sulfate-Mg Sulf (SUPREP BOWEL PREP KIT) 17.5-3.13-1.6 GM/177ML SOLN Take 1 kit by  mouth once for 1 dose.  . [DISCONTINUED] Hyoscyamine Sulfate SL (LEVSIN/SL) 0.125 MG SUBL Dissolve one tablet on the tongue every 6 hours for spasms.  . [DISCONTINUED] mesalamine (CANASA) 1000 MG suppository Place 1 suppository (1,000 mg total) rectally at bedtime. For 2 weeks  . [DISCONTINUED] pantoprazole (PROTONIX) 40 MG tablet Take 1 tablet (40 mg total) by mouth daily. (Patient taking differently: Take 40 mg by mouth at bedtime. )  . [DISCONTINUED] predniSONE (DELTASONE) 20 MG tablet Take 1 tablet (20 mg total) by mouth daily with breakfast.   Facility-Administered Encounter Medications as of 04/03/2018  Medication  . nitroGLYCERIN (NITROSTAT) SL tablet 0.3 mg    Allergies as of 04/03/2018 - Review Complete 04/03/2018  Allergen Reaction Noted  . Lipitor [atorvastatin] Other (See Comments) 03/28/2013  . Remicade [infliximab]    . Ticagrelor  09/16/2017    Past Medical History:  Diagnosis Date  . Allergic rhinitis due to pollen   . Anxiety state, unspecified   . Arthritis   . Bronchitis    hx  . Calculus of gallbladder without mention of cholecystitis or obstruction   . Crohn disease ([place])   . Depressive disorder, not elsewhere classified   . Diabetes mellitus without complication (HCC)    diet controlled  . Esophageal reflux   . Fibromyalgia   . Headache   . History of nuclear stress test    ETT-Myoview 10/17: EF 55%, normal perfusion; Low Risk  . Myalgia and myositis, unspecified   . NSTEMI (non-ST elevated myocardial infarction) ([place]) 04/15/2017  . Other and unspecified hyperlipidemia   . Palpitations   . Pneumonia 15   hx  . S/P angioplasty with stent 04/18/17 with DES to pLAD 04/19/2017  . Unspecified essential hypertension   . Vitamin B deficiency     Past Surgical History:  Procedure Laterality Date  . CHOLECYSTECTOMY N/A 09/19/2014   Procedure: LAPAROSCOPIC CHOLECYSTECTOMY WITH INTRAOPERATIVE CHOLANGIOGRAM;  Surgeon: Donnie Mesa, MD;  Location: [facility];   Service: General;  Laterality: N/A;  . CORONARY STENT INTERVENTION N/A 04/18/2017   Procedure: CORONARY STENT INTERVENTION;  Surgeon: Martinique, Peter M, MD;  Location: [facility];  Service: Cardiovascular;  Laterality: N/A;  . ESOPHAGOGASTRODUODENOSCOPY    . EXPLORATORY LAPAROTOMY     for endometriosis  . INTRAVASCULAR PRESSURE WIRE/FFR STUDY N/A 04/18/2017   Procedure: INTRAVASCULAR PRESSURE WIRE/FFR STUDY;  Surgeon: Martinique, Peter M, MD;  Location: [facility];  Service: Cardiovascular;  Laterality: N/A;  . LEFT HEART CATH AND CORONARY ANGIOGRAPHY N/A 04/18/2017   Procedure: LEFT HEART CATH AND CORONARY ANGIOGRAPHY;  Surgeon: Martinique, Peter M, MD;  Location: [facility];  Service: Cardiovascular;  Laterality: N/A;  . TONSILLECTOMY AND ADENOIDECTOMY    . TUBAL LIGATION     BILATERAL    Family History  Problem Relation Age of Onset  . Lung cancer Maternal Grandmother   . Stroke Father   . Hypertension Mother   . Heart attack Mother   .  Heart disease Mother   . Hypertension Brother   . Heart disease Brother   . Hypertension Sister   . Heart disease Sister   . Coronary artery disease Brother   . Colon cancer Neg Hx   . Stomach cancer Neg Hx     Social History   Socioeconomic History  . Marital status: Married    Spouse name: Dane  . Number of children: 2  . Years of education: 12+  . Highest education level: Not on file  Occupational History  . Occupation: ACCOUNT REP.    Employer: [employer]  Social Needs  . Financial resource strain: Not on file  . Food insecurity:    Worry: Not on file    Inability: Not on file  . Transportation needs:    Medical: Not on file    Non-medical: Not on file  Tobacco Use  . Smoking status: Former Smoker    Packs/day: 1.00    Years: 35.00    Pack years: 35.00    Types: Cigarettes    Last attempt to quit: 05/31/2004    Years since quitting: 13.8  . Smokeless tobacco: Never Used  Substance and  Sexual Activity  . Alcohol use: No  . Drug use: No  . Sexual activity: Not Currently    Comment: married  Lifestyle  . Physical activity:    Days per week: Not on file    Minutes per session: Not on file  . Stress: Not on file  Relationships  . Social connections:    Talks on phone: Not on file    Gets together: Not on file    Attends religious service: Not on file    Active member of club or organization: Not on file    Attends meetings of clubs or organizations: Not on file    Relationship status: Not on file  . Intimate partner violence:    Fear of current or ex partner: Not on file    Emotionally abused: Not on file    Physically abused: Not on file    Forced sexual activity: Not on file  Other Topics Concern  . Not on file  Social History Narrative   Lives with her husband and their pets. Her children are adults and live independently.      Review of systems: Review of Systems  Constitutional: Negative for fever and chills.  Positive for lack of energy and night sweats HENT: Positive for sinus problem  Eyes: Negative for blurred vision.  Respiratory: Positive for cough, shortness of breath and wheezing.   Cardiovascular: Negative for chest pain and palpitations.  Gastrointestinal: as per HPI Genitourinary: Negative for dysuria, urgency, frequency and hematuria.  Musculoskeletal: Positive for myalgias, back pain and joint pain.  Skin: Negative for itching and rash.  Neurological: Negative for dizziness, tremors, focal weakness, seizures and loss of consciousness.  Endo/Heme/Allergies: Positive for seasonal allergies.  Psychiatric/Behavioral: Negative for depression, suicidal ideas and hallucinations.  All other systems reviewed and are negative.   Physical Exam: Vitals:   04/03/18 1049  BP: 118/70  Pulse: 72   Body mass index is 25.69 kg/m. Gen:      No acute distress HEENT:  EOMI, sclera anicteric Neck:     No masses; no thyromegaly Lungs:    Clear to  auscultation bilaterally; normal respiratory effort CV:         Regular rate and rhythm; no murmurs Abd:      + bowel sounds; soft, non-tender; no  palpable masses, no distension Ext:    No edema; adequate peripheral perfusion Skin:      Warm and dry; no rash Neuro: alert and oriented x 3 Psych: normal mood and affect  Data Reviewed:  Reviewed labs, radiology imaging, old records and pertinent past GI work up   Assessment and Plan/Recommendations:  57 year old female with history of Crohn's disease with involvement of small bowel, large intestine and perianal fistula On Entyvio, received first 2 induction doses Will schedule for colonoscopy next month to monitor disease activity and also due for surveillance colonoscopy for colorectal cancer screening given long-standing history of IBD >15 years Chronic B12 deficiency, low B12 level despite monthly B12 injection.  We will change it to every week for 1 month to improve the B12 level and then continue B12 injection every month for maintenance Will discuss with cardiology if okay to hold Plavix for 5 days prior to colonoscopy The risks and benefits as well as alternatives of endoscopic procedure(s) have been discussed and reviewed. All questions answered. The patient agrees to proceed.   Damaris Hippo , MD [phone]    CC: Hamrick, Lorin Mercy, MD

## 2018-04-04 NOTE — Telephone Encounter (Signed)
Dr. Saunders Revel, this pt is for endoscopy. She had DES to LAD in 03/2017. Can plavix be held for procedure?  Please route response back to P CV DIV PREOP  Thanks, Gae Bon

## 2018-04-04 NOTE — Telephone Encounter (Signed)
I last saw Ms. Lees in August, at which time she was doing well.  She was subsequently seen in the ED for chest pain in September.  I suggest that she be seen by an APP to make sure that she has not had further chest pain before stopping clopidogrel and proceeding with colonoscopy.  If no recurrent symptoms, I think it would be fine to stop clopidogrel, given that it has been about a year since her PCI.  She should continue aspirin during the perioperative period.  Nelva Bush, MD [facility] Pager: [phone]

## 2018-04-05 NOTE — Telephone Encounter (Signed)
   Primary Cardiologist:Christopher End, MD  Chart reviewed as part of pre-operative protocol coverage. Because of ANACAREN KOHAN past medical history and time since last visit, she will require a follow-up visit in order to better assess preoperative cardiovascular risk. See chart below - Dr. Saunders Revel had suggested repeat OV prior to clearing her to come off Plavix given h/o ER visit for chest pain. It appears she is scheduled to see him on 04/07/18 at which time formal clearance decisions can finalized. Per protocol, provider should route final clearance recommendations to requesting party outlined below ([facility]). Will remove this message from APP box and fax to GI to make them aware.     Charlie Pitter, PA-C  04/05/2018, 2:56 PM

## 2018-04-05 NOTE — Telephone Encounter (Signed)
Will be addressed at f/u visit in 2 days.  Nelva Bush, MD [facility] Pager: [phone]

## 2018-04-07 ENCOUNTER — Ambulatory Visit (INDEPENDENT_AMBULATORY_CARE_PROVIDER_SITE_OTHER): Payer: BLUE CROSS/BLUE SHIELD | Admitting: Internal Medicine

## 2018-04-07 ENCOUNTER — Encounter: Payer: Self-pay | Admitting: Internal Medicine

## 2018-04-07 VITALS — BP 110/80 | HR 76 | Ht 63.0 in | Wt 145.5 lb

## 2018-04-07 DIAGNOSIS — I2510 Atherosclerotic heart disease of native coronary artery without angina pectoris: Secondary | ICD-10-CM

## 2018-04-07 DIAGNOSIS — I10 Essential (primary) hypertension: Secondary | ICD-10-CM

## 2018-04-07 DIAGNOSIS — E785 Hyperlipidemia, unspecified: Secondary | ICD-10-CM | POA: Diagnosis not present

## 2018-04-07 DIAGNOSIS — Z01810 Encounter for preprocedural cardiovascular examination: Secondary | ICD-10-CM

## 2018-04-07 DIAGNOSIS — R0600 Dyspnea, unspecified: Secondary | ICD-10-CM

## 2018-04-07 DIAGNOSIS — R0609 Other forms of dyspnea: Secondary | ICD-10-CM | POA: Diagnosis not present

## 2018-04-07 NOTE — Progress Notes (Signed)
Follow-up Outpatient Visit Date: 04/07/2018  Primary Care Provider: Leonides Sake, MD [address] [place] [address]  Chief Complaint: Shortness of breath  HPI:  [name] is a 57 y.o. year-old female with history of coronary artery disease with NSTEMI (03/2017), hypertension, hyperlipidemia, diabetes mellitus, and Crohn's disease, who presents for follow-up of coronary artery disease.  I last saw her in August, at which time she noted chronic fatigue.  She noted mild exertional dyspnea when outside in the heat but no chest pain.  She presented to the ED on 02/11/18 with acute chest pain.  She had been without pantoprazole and was concerned that symptoms were related to GERD.  ED workup was unrevealing other than non-specific T-wave changes on EKG.  Labs in early October showed LDL at goal but persistent mild transaminitis.  Follow-up with her hepatologist was recommended.  At our visit in August, [name] also noted pain in her calves.  We agreed to a 1 month statin holiday to see if her symptoms improve.  Today, [name] reports feeling about the same as at our last visit.  Aching in her calves is predominantly at night ad did not improve with statin holiday.  She does not have any leg pain with ambulation.  She has not had any further episodes of chest pain since her ED visit.  She has stable intermittent dyspnea on exertion, which has been present ever since her NSTEMI/PCI a year ago.  She notes frequent abdominal bloating and discomfort and is scheduled for colonoscopy next month for further evaluation, given her history of Crohn's disease.  --------------------------------------------------------------------------------------------------  Past Medical History:  Diagnosis Date  . Allergic rhinitis due to pollen   . Anxiety state, unspecified   . Arthritis   . Bronchitis    hx  . Calculus of gallbladder without mention of cholecystitis or obstruction   . Crohn disease  ([place])   . Depressive disorder, not elsewhere classified   . Diabetes mellitus without complication (HCC)    diet controlled  . Esophageal reflux   . Fibromyalgia   . Headache   . History of nuclear stress test    ETT-Myoview 10/17: EF 55%, normal perfusion; Low Risk  . Myalgia and myositis, unspecified   . NSTEMI (non-ST elevated myocardial infarction) ([place]) 04/15/2017  . Other and unspecified hyperlipidemia   . Palpitations   . Pneumonia 15   hx  . S/P angioplasty with stent 04/18/17 with DES to pLAD 04/19/2017  . Unspecified essential hypertension   . Vitamin B deficiency    Past Surgical History:  Procedure Laterality Date  . CHOLECYSTECTOMY N/A 09/19/2014   Procedure: LAPAROSCOPIC CHOLECYSTECTOMY WITH INTRAOPERATIVE CHOLANGIOGRAM;  Surgeon: Donnie Mesa, MD;  Location: [facility];  Service: General;  Laterality: N/A;  . CORONARY STENT INTERVENTION N/A 04/18/2017   Procedure: CORONARY STENT INTERVENTION;  Surgeon: Martinique, Peter M, MD;  Location: [place] [facility] [facility];  Service: Cardiovascular;  Laterality: N/A;  . ESOPHAGOGASTRODUODENOSCOPY    . EXPLORATORY LAPAROTOMY     for endometriosis  . INTRAVASCULAR PRESSURE WIRE/FFR STUDY N/A 04/18/2017   Procedure: INTRAVASCULAR PRESSURE WIRE/FFR STUDY;  Surgeon: Martinique, Peter M, MD;  Location: [facility];  Service: Cardiovascular;  Laterality: N/A;  . LEFT HEART CATH AND CORONARY ANGIOGRAPHY N/A 04/18/2017   Procedure: LEFT HEART CATH AND CORONARY ANGIOGRAPHY;  Surgeon: Martinique, Peter M, MD;  Location: [facility];  Service: Cardiovascular;  Laterality: N/A;  . TONSILLECTOMY AND ADENOIDECTOMY    .  TUBAL LIGATION     BILATERAL    Current Meds  Medication Sig  . albuterol (PROVENTIL) (2.5 MG/3ML) 0.083% nebulizer solution Take 2.5 mg by nebulization every 4 (four) hours as needed.   Marland Kitchen amoxicillin (AMOXIL) 875 MG tablet Take 875 mg by mouth 2 (two) times daily.  . Ascorbic Acid (VITAMIN C) 1000 MG tablet Take 1,000 mg by mouth  at bedtime.  Marland Kitchen aspirin 81 MG chewable tablet Chew 81 mg by mouth daily.    . budesonide (ENTOCORT EC) 3 MG 24 hr capsule Take 3 capsules (9 mg total) by mouth daily.  . clopidogrel (PLAVIX) 75 MG tablet Take 1 tablet (75 mg total) by mouth daily.  . DULoxetine (CYMBALTA) 60 MG capsule Take 60 mg by mouth daily.  . enalapril (VASOTEC) 5 MG tablet Take 1 tablet (5 mg total) by mouth daily.  Marland Kitchen EPIPEN 2-PAK 0.3 MG/0.3ML SOAJ injection Reported on 06/27/2015  . fenofibrate (TRICOR) 145 MG tablet TAKE ONE TABLET BY MOUTH DAILY (Patient taking differently: Take 145 mg by mouth at bedtime. )  . metFORMIN (GLUCOPHAGE) 500 MG tablet Take 1 tablet (500 mg total) by mouth 2 (two) times daily with a meal.  . metoprolol succinate (TOPROL-XL) 50 MG 24 hr tablet TAKE ONE TABLET BY MOUTH DAILY IMMEDIATELY FOLLOWING A MEAL (Patient taking differently: Take 25 mg by mouth at bedtime. )  . nitroGLYCERIN (NITROSTAT) 0.4 MG SL tablet Place 0.4 mg under the tongue every 5 (five) minutes as needed for chest pain.  Marland Kitchen Potassium 99 MG TABS Take 99 mg by mouth at bedtime.  . pravastatin (PRAVACHOL) 20 MG tablet Take 20 mg by mouth at bedtime.   . rifaximin (XIFAXAN) 550 MG TABS tablet Take 1 tablet (550 mg total) by mouth 3 (three) times daily. Take for 14 days  . vedolizumab (ENTYVIO) 300 MG injection Inject 300 mg into the vein every 8 (eight) weeks. After the standard induction  . Vitamin D3 (VITAMIN D) 25 MCG tablet Take 1,000 Units by mouth at bedtime.   Current Facility-Administered Medications for the 04/07/18 encounter (Office Visit) with Tequlia Gonsalves, Harrell Gave, MD  Medication  . nitroGLYCERIN (NITROSTAT) SL tablet 0.3 mg    Allergies: Lipitor [atorvastatin]; Remicade [infliximab]; and Ticagrelor  Social History   Tobacco Use  . Smoking status: Former Smoker    Packs/day: 1.00    Years: 35.00    Pack years: 35.00    Types: Cigarettes    Last attempt to quit: 05/31/2004    Years since quitting: 13.8  . Smokeless  tobacco: Never Used  Substance Use Topics  . Alcohol use: No  . Drug use: No    Family History  Problem Relation Age of Onset  . Lung cancer Maternal Grandmother   . Stroke Father   . Hypertension Mother   . Heart attack Mother   . Heart disease Mother   . Hypertension Brother   . Heart disease Brother   . Hypertension Sister   . Heart disease Sister   . Coronary artery disease Brother   . Colon cancer Neg Hx   . Stomach cancer Neg Hx     Review of Systems: A 12-system review of systems was performed and was negative except as noted in the HPI.  --------------------------------------------------------------------------------------------------  Physical Exam: BP 110/80 (BP Location: Left Arm, Patient Position: Sitting, Cuff Size: Normal)   Pulse 76   Ht 5' 3"  (1.6 m)   Wt 145 lb 8 oz (66 kg)   LMP  (LMP  Unknown)   BMI 25.77 kg/m   General: NAD. HEENT: No conjunctival pallor or scleral icterus. Moist mucous membranes.  OP clear. Neck: Supple without lymphadenopathy, thyromegaly, JVD, or HJR. Lungs: Normal work of breathing. Clear to auscultation bilaterally without wheezes or crackles. Heart: Regular rate and rhythm without murmurs, rubs, or gallops. Non-displaced PMI. Abd: Bowel sounds present. Soft with mild diffuse tenderness.  No rebound or guarding.  No HSM. Ext: No lower extremity edema. Radial, PT, and DP pulses are 2+ bilaterally. Skin: Warm and dry without rash.  EKG: Normal sinus rhythm with nonspecific T wave changes, not significantly changed since 02/11/2018.  Lab Results  Component Value Date   WBC 8.7 02/20/2018   HGB 14.2 02/20/2018   HCT 42.0 02/20/2018   MCV 91.6 02/20/2018   PLT 321.0 02/20/2018    Lab Results  Component Value Date   NA 139 02/20/2018   K 4.9 02/20/2018   CL 100 02/20/2018   CO2 30 02/20/2018   BUN 19 02/20/2018   CREATININE 0.69 02/20/2018   GLUCOSE 213 (H) 02/20/2018   ALT 75 (H) 02/20/2018    Lab Results    Component Value Date   CHOL 247 (H) 12/18/2015   HDL 34.10 (L) 12/18/2015   LDLCALC 127 08/27/2013   LDLDIRECT 85.0 12/18/2015   TRIG (H) 12/18/2015    946.0 Triglyceride is over 400; calculations on Lipids are invalid.   CHOLHDL 7 12/18/2015    --------------------------------------------------------------------------------------------------  ASSESSMENT AND PLAN: Coronary artery disease Other than one episode of chest pain in September, which is atypical, [name]. Levan is not had any further episodes of angina.  She is almost a year from her NSTEMI and PCI.  We will defer further testing for now.  I will have her stop taking clopidogrel at the Tristan Bramble of this month.  She will remain on indefinite aspirin and statin therapy.  Hyperlipidemia LDL was at goal on last check of outside labs in early October.  Triglycerides remain slightly elevated.  Given history of elevated transaminases and statin intolerance, we will continue with pravastatin 20 mg daily.  I have asked [name] to continue following with GI for monitoring of her abnormal LFTs.  Dyspnea on exertion Chronic and intermittent.  No further work-up at this time.  Hypertension BP upper normal.  Continue current medications.  Preoperative cardiovascular risk assessment [name] does not report any unstable cardiac symptoms.  Colonoscopy is a low risk procedure from a perioperative cardiovascular risk standpoint.  I think it is reasonable for her to proceed without additional testing or intervention.  We will discontinue clopidogrel at the Chrissi Crow of November.  She should remain on low-dose aspirin in the perioperative period.  Follow-up: Given my transition to the [place] office, [name] will follow-up with Dr. Meda Coffee in 6 months.  Nelva Bush, MD 04/07/2018 9:33 AM

## 2018-04-07 NOTE — Patient Instructions (Signed)
Medication Instructions:  STOP the Plavix at the end of the month  If you need a refill on your cardiac medications before your next appointment, please call your pharmacy.   Lab work: None ordered  Testing/Procedures: None ordered  Follow-Up: At [facility], you and your health needs are our priority.  As part of our continuing mission to provide you with exceptional heart care, we have created designated Provider Care Teams.  These Care Teams include your primary Cardiologist (physician) and Advanced Practice Providers (APPs -  Physician Assistants and Nurse Practitioners) who all work together to provide you with the care you need, when you need it. You will need a follow up appointment in 6 months.  Please call our office 2 months in advance to schedule this appointment.  You may see Dr. Meda Coffee or one of the following Advanced Practice Providers on your designated Care Team:   [place], PA-C Melina Copa, PA-C . Ermalinda Barrios, PA-C  Special instructions:  You may move forward with your colonoscopy in December.

## 2018-04-10 NOTE — Telephone Encounter (Signed)
Ok to hold patient aware  See Cardiology office note

## 2018-05-01 ENCOUNTER — Other Ambulatory Visit: Payer: Self-pay | Admitting: Physician Assistant

## 2018-05-01 ENCOUNTER — Encounter: Payer: Self-pay | Admitting: Gastroenterology

## 2018-05-01 DIAGNOSIS — K50813 Crohn's disease of both small and large intestine with fistula: Secondary | ICD-10-CM | POA: Diagnosis not present

## 2018-05-04 ENCOUNTER — Encounter: Payer: Self-pay | Admitting: Gastroenterology

## 2018-05-10 ENCOUNTER — Encounter: Payer: Self-pay | Admitting: Gastroenterology

## 2018-05-10 ENCOUNTER — Ambulatory Visit: Payer: BLUE CROSS/BLUE SHIELD | Admitting: Gastroenterology

## 2018-05-10 VITALS — BP 131/73 | HR 73 | Temp 97.3°F | Ht 63.0 in | Wt 145.0 lb

## 2018-05-10 MED ORDER — SODIUM CHLORIDE 0.9 % IV SOLN: 500.0000 mL | Freq: Once | INTRAVENOUS | Status: DC

## 2018-05-10 NOTE — Progress Notes (Signed)
Pt states that she didn't do this morning's dose of the Suprep.  When I asked why, she states, "it was clear and foamy and I've been told that's good enough."  She did use the restroom and she does has solid stool in the toilet.  Dr. Claud Kelp aware and procedure cancelled.  PV and repeat colonoscopy scheduled per Judson Roch Monday RN

## 2018-05-11 ENCOUNTER — Telehealth: Payer: Self-pay | Admitting: Gastroenterology

## 2018-05-11 ENCOUNTER — Telehealth: Payer: Self-pay | Admitting: Internal Medicine

## 2018-05-11 NOTE — Telephone Encounter (Signed)
Spoke to patient who has been having a lot of stress lately at work and becomes SOB with exertion.  She had an episode of CP this morning in bed which lasted about 10 minutes.  She then got out of bed and has been nauseated throughout the day, but still went to work.    We arranged an appt 12/17 with Scott (12:15p).  I told her that if CP returns and is more intense that she needs to go to the ED.  She verbalized understanding.

## 2018-05-11 NOTE — Telephone Encounter (Signed)
Pt c/o of Chest Pain: STAT if CP now or developed within 24 hours  1. Are you having CP right now? No- early this morning  2. Are you experiencing any other symptoms (ex. SOB, nausea, vomiting, sweating)? Nauseated off and on all day  3. How long have you been experiencing CP? This morning  4. Is your CP continuous or coming and going? Just this morning  5. Have you taken Nitroglycerin? no ?

## 2018-05-11 NOTE — Telephone Encounter (Signed)
Difficult situation for the patient in her job. She has a high demand job. She is asking if the Crohn's could be a factor in the overwhelming stress she feels. 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 "Crying daily." I have encouraged her to contact her PCP and schedule an appointment.

## 2018-05-15 NOTE — Progress Notes (Signed)
Cardiology Office Note:    Date:  05/16/2018   ID:  [name], DOB 02/16/1961, MRN [id number]  PCP:  [name] Sake, MD  Cardiologist:  [name] Bush, MD >> [name] Electrophysiologist:  None  GI: Dr. Silverio [name]  Referring MD: [name] Sake, MD   Chief Complaint  Patient presents with  . Chest Pain     History of Present Illness:    [name] is a 58 y.o. female with Crohn's disease, GERD, hypertension, diabetes, HL, palpitations, CAD and strong FHx of CAD. She suffered a NSTEMI in 11/18 treated with a DES to the proximal LAD.  She was last seen by Dr. Saunders [name] in 03/2018.  She called in recently with complaints of chest pain.    [name] returns for evaluation of chest pain.  She had an episode of left-sided chest discomfort with associated nausea about 10 days ago.  This lasted about 5 minutes.  She did not take nitroglycerin.  She had no associated symptoms.  She also notes substernal chest discomfort associated with emotional stress with radiation to her back between her scapulae.  She has occasional shortness of breath with exertion.  This is unchanged.  She denies orthopnea, paroxysmal nocturnal dyspnea or lower extremity swelling.  She denies syncope.    Prior CV studies:   The following studies were reviewed today:  Cardiac Catheterization 04/18/17 LM normal LAD prox 65 (FFR 0.82) RI normal LCx normal  RCA mild irregs EF 50-55, ant HK PCI:  3 x 12 Promus Premier DES to prox LAD          Echo 04/18/17 EF 55-60, mild ant-sept HK, Gr 1 DD, PASP 35  Myoview 03/29/16  Nuclear stress EF: 55%.  There was no ST segment deviation noted during stress.  The study is normal.  This is a low risk study.  The left ventricular ejection fraction is normal (55-65%).   Coronary CTA 11/12 Left Main: No plaque or stenosis Left Anterior Descending: Mild nonobstructive mixed plaque in the mid LAD. Left Circumflex: Moderate sized ramus with no plaque or stenosis.  The CFX itself with a small vessel with no plaque or stenosis. Right Coronary Artery: Dominant vessel, no plaque or stenosis. Coronary Calcium Score: 7 Agatston units IMPRESSION: 1. No obstructive coronary disease.  There was mild nonobstructive plaque in the mid LAD. 2. Coronary calcium score of 4 Agatston units places the patient in the [place] percentile for her age and gender.  This is a high risk category for future cardiac events.  I would suggest ASA 81 mg daily and aggressive lowering of LDL cholesterol to ideally < 70 but at least < 100.   Myoview 3/11 EF 67, no ischemia   Past Medical History:  Diagnosis Date  . Allergic rhinitis due to pollen   . Anxiety state, unspecified   . Arthritis   . Bronchitis    hx  . Calculus of gallbladder without mention of cholecystitis or obstruction   . Crohn disease ([place])   . Depressive disorder, not elsewhere classified   . Diabetes mellitus without complication (HCC)    diet controlled  . Esophageal reflux   . Fibromyalgia   . Headache   . History of nuclear stress test    ETT-Myoview 10/17: EF 55%, normal perfusion; Low Risk  . Myalgia and myositis, unspecified   . NSTEMI (non-ST elevated myocardial infarction) ([facility]) 04/15/2017  . Other and unspecified hyperlipidemia   . Palpitations   . Pneumonia 15  hx  . S/P angioplasty with stent 04/18/17 with DES to pLAD 04/19/2017  . Unspecified essential hypertension   . Vitamin B deficiency    Surgical Hx: The patient  has a past surgical history that includes Tonsillectomy and adenoidectomy; Tubal ligation; Exploratory laparotomy; Esophagogastroduodenoscopy; Cholecystectomy (N/A, 09/19/2014); LEFT HEART CATH AND CORONARY ANGIOGRAPHY (N/A, 04/18/2017); INTRAVASCULAR PRESSURE WIRE/FFR STUDY (N/A, 04/18/2017); and CORONARY STENT INTERVENTION (N/A, 04/18/2017).   Current Medications: Current Meds  Medication Sig  . albuterol (PROVENTIL) (2.5 MG/3ML) 0.083% nebulizer solution Take 2.5 mg by  nebulization every 4 (four) hours as needed.   Marland Kitchen aspirin 81 MG chewable tablet Chew 81 mg by mouth daily.    . budesonide (ENTOCORT EC) 3 MG 24 hr capsule Take 3 capsules (9 mg total) by mouth daily.  . DULoxetine (CYMBALTA) 60 MG capsule Take 60 mg by mouth daily.  . enalapril (VASOTEC) 5 MG tablet Take 1 tablet (5 mg total) by mouth daily.  Marland Kitchen EPIPEN 2-PAK 0.3 MG/0.3ML SOAJ injection Reported on 06/27/2015  . fenofibrate (TRICOR) 145 MG tablet TAKE ONE TABLET BY MOUTH DAILY  . metFORMIN (GLUCOPHAGE) 500 MG tablet Take 1 tablet (500 mg total) by mouth 2 (two) times daily with a meal.  . metoprolol succinate (TOPROL-XL) 50 MG 24 hr tablet TAKE ONE TABLET BY MOUTH DAILY IMMEDIATELY FOLLOWING A MEAL  . nitroGLYCERIN (NITROSTAT) 0.4 MG SL tablet Place 0.4 mg under the tongue every 5 (five) minutes as needed for chest pain.  . pantoprazole (PROTONIX) 40 MG tablet TAKE ONE TABLET BY MOUTH DAILY  . Potassium 99 MG TABS Take 99 mg by mouth at bedtime.  . pravastatin (PRAVACHOL) 20 MG tablet Take 20 mg by mouth at bedtime.   . vedolizumab (ENTYVIO) 300 MG injection Inject 300 mg into the vein every 8 (eight) weeks. After the standard induction  . Vitamin D3 (VITAMIN D) 25 MCG tablet Take 1,000 Units by mouth at bedtime.  . [DISCONTINUED] amoxicillin (AMOXIL) 875 MG tablet Take 875 mg by mouth 2 (two) times daily.  . [DISCONTINUED] Ascorbic Acid (VITAMIN C) 1000 MG tablet Take 1,000 mg by mouth at bedtime.  . [DISCONTINUED] rifaximin (XIFAXAN) 550 MG TABS tablet Take 1 tablet (550 mg total) by mouth 3 (three) times daily. Take for 14 days   Current Facility-Administered Medications for the 05/16/18 encounter (Office Visit) with [name], [name]  Medication  . 0.9 %  sodium chloride infusion  . nitroGLYCERIN (NITROSTAT) SL tablet 0.3 mg     Allergies:   Lipitor [atorvastatin]; Remicade [infliximab]; and Ticagrelor   Social History   Tobacco Use  . Smoking status: Former Smoker    Packs/day:  1.00    Years: 35.00    Pack years: 35.00    Types: Cigarettes    Last attempt to quit: 05/31/2004    Years since quitting: 13.9  . Smokeless tobacco: Never Used  Substance Use Topics  . Alcohol use: No  . Drug use: No     Family Hx: The patient's family history includes Coronary artery disease in her brother; Heart attack in her mother; Heart disease in her brother, mother, and sister; Hypertension in her brother, mother, and sister; Lung cancer in her maternal grandmother; Stroke in her father. There is no history of Colon cancer or Stomach cancer.  ROS:   Please see the history of present illness.    Review of Systems  Constitution: Positive for diaphoresis and malaise/fatigue.  Cardiovascular: Positive for chest pain and irregular heartbeat.  Respiratory:  Positive for cough.   Hematologic/Lymphatic: Positive for bleeding problem. Bruises/bleeds easily.  Gastrointestinal: Positive for abdominal pain and diarrhea.  Neurological: Positive for headaches.  Psychiatric/Behavioral: Positive for depression.   All other systems reviewed and are negative.   EKGs/Labs/Other Test Reviewed:    EKG:  EKG is  ordered today.  The ekg ordered today demonstrates normal sinus rhythm, heart rate 77, normal axis, nonspecific ST-[name] wave changes, QTC 459, no change from prior tracing   Recent Labs: 02/20/2018: ALT 75; BUN 19; Creatinine, Ser 0.69; Hemoglobin 14.2; Platelets 321.0; Potassium 4.9; Sodium 139   Recent Lipid Panel Lab Results  Component Value Date/Time   CHOL 247 (H) 12/18/2015 10:03 AM   TRIG (H) 12/18/2015 10:03 AM    946.0 Triglyceride is over 400; calculations on Lipids are invalid.   HDL 34.10 (L) 12/18/2015 10:03 AM   CHOLHDL 7 12/18/2015 10:03 AM   LDLCALC 127 08/27/2013   LDLDIRECT 85.0 12/18/2015 10:03 AM    Physical Exam:    VS:  BP 130/80   Pulse 77   Ht 5' 3"  (1.6 m)   Wt 141 lb 12.8 oz (64.3 kg)   LMP  (LMP Unknown)   SpO2 94%   BMI 25.12 kg/m     Wt  Readings from Last 3 Encounters:  05/16/18 141 lb 12.8 oz (64.3 kg)  05/10/18 145 lb (65.8 kg)  04/07/18 145 lb 8 oz (66 kg)     Physical Exam  Constitutional: She is oriented to person, place, and time. She appears well-developed and well-nourished. No distress.  HENT:  Head: Normocephalic and atraumatic.  Eyes: No scleral icterus.  Neck: No JVD present. No thyromegaly present.  Cardiovascular: Normal rate and regular rhythm.  No murmur heard. Pulmonary/Chest: Effort normal. She has no rales.  Abdominal: Soft.  Musculoskeletal:        General: No edema.  Lymphadenopathy:    She has no cervical adenopathy.  Neurological: She is alert and oriented to person, place, and time.  Skin: Skin is warm and dry.  Psychiatric: She has a normal mood and affect.    ASSESSMENT & PLAN:    Other chest pain She presents with several episodes of chest discomfort over the past couple of weeks.  Some of these symptoms are related to emotional stress.  She asked for a letter to be provided to her today to see if she could change her work environment in order to reduce her stress level.  I have provided that to her today.  Her ECG is unchanged.  Her symptoms are not really reminiscent of her previous angina.  As she did have single-vessel disease at her cardiac catheterization last year, I think stress testing would be reasonable approach to assess for progressive coronary artery disease.  Question if she may have some vasospasm as well.  I have suggested that we start her on low-dose long-acting nitrate therapy to see if she has any improvement.  -Start isosorbide mononitrate 15 mg daily  -Arrange ETT-Myoview  -Follow-up 4 weeks  -If her stress test is normal and she has no improvement with nitrates, consider referral back to GI  Coronary artery disease involving native coronary artery of native heart without angina pectoris  History of NSTEMI November 2018 treated with a drug-eluting stent to the  proximal LAD.  As noted, she has had some chest discomfort recently and stress testing will be arranged.  Continue aspirin, beta-blocker, statin.  Essential hypertension The patient's blood pressure is controlled on her  current regimen.  Continue current therapy.   Hyperlipidemia LDL goal <70  She has been maintained on low-dose pravastatin given her previous history of elevated transaminases statin intolerance.   Dispo:  Return in about 4 weeks (around 06/13/2018) for Follow up after testing, w/ [name] Dopp, [name].   Medication Adjustments/Labs and Tests Ordered: Current medicines are reviewed at length with the patient today.  Concerns regarding medicines are outlined above.  Tests Ordered: Orders Placed This Encounter  Procedures  . MYOCARDIAL PERFUSION IMAGING  . EKG 12-Lead   Medication Changes: Meds ordered this encounter  Medications  . isosorbide mononitrate (IMDUR) 30 MG 24 hr tablet    Sig: Take 0.5 tablets (15 mg total) by mouth daily.    Dispense:  45 tablet    Refill:  3    Signed, [name] Dopp, [name]  05/16/2018 5:23 PM    [address] [place], [place],   [address] Phone: [phone]; Fax: [phone]

## 2018-05-16 ENCOUNTER — Encounter: Payer: Self-pay | Admitting: Physician Assistant

## 2018-05-16 ENCOUNTER — Encounter (INDEPENDENT_AMBULATORY_CARE_PROVIDER_SITE_OTHER): Payer: Self-pay

## 2018-05-16 ENCOUNTER — Ambulatory Visit (INDEPENDENT_AMBULATORY_CARE_PROVIDER_SITE_OTHER): Payer: BLUE CROSS/BLUE SHIELD | Admitting: Physician Assistant

## 2018-05-16 ENCOUNTER — Telehealth: Payer: Self-pay

## 2018-05-16 VITALS — BP 130/80 | HR 77 | Ht 63.0 in | Wt 141.8 lb

## 2018-05-16 DIAGNOSIS — I2510 Atherosclerotic heart disease of native coronary artery without angina pectoris: Secondary | ICD-10-CM

## 2018-05-16 DIAGNOSIS — E785 Hyperlipidemia, unspecified: Secondary | ICD-10-CM | POA: Diagnosis not present

## 2018-05-16 DIAGNOSIS — R0789 Other chest pain: Secondary | ICD-10-CM | POA: Diagnosis not present

## 2018-05-16 DIAGNOSIS — I10 Essential (primary) hypertension: Secondary | ICD-10-CM

## 2018-05-16 MED ORDER — ISOSORBIDE MONONITRATE ER 30 MG PO TB24: 15.0000 mg | ORAL_TABLET | Freq: Every day | ORAL | 3 refills | Status: DC

## 2018-05-16 NOTE — Telephone Encounter (Signed)
Pt is a no show today. Called pt today, pt verbalize she forgot about this appointment. Pt had a appointment today with cardiologist PA Richardson Dopp. Cardiologist office has schedule her a Stress test for 12-27 because pt has been complaining of chest pain on and off. Advised pt she needs to be cleared with cardiologist before we can do procedure. I am canceling her previsit and procedure appointment. Advised pt to contact us after she is cleared from cardiologist.

## 2018-05-16 NOTE — Patient Instructions (Addendum)
Medication Instructions:  Your physician has recommended you make the following change in your medication:  1. START IMDUR 15 MG DAILY.  If you need a refill on your cardiac medications before your next appointment, please call your pharmacy.   Lab work: NONE If you have labs (blood work) drawn today and your tests are completely normal, you will receive your results only by: Marland Kitchen MyChart Message (if you have MyChart) OR . A paper copy in the mail If you have any lab test that is abnormal or we need to change your treatment, we will call you to review the results.  Testing/Procedures: Your physician has requested that you have en exercise stress myoview. For further information please visit [email or web address]. Please follow instruction sheet, as given.   Follow-Up: At [facility], you and your health needs are our priority.  As part of our continuing mission to provide you with exceptional heart care, we have created designated Provider Care Teams.  These Care Teams include your primary Cardiologist (physician) and Advanced Practice Providers (APPs -  Physician Assistants and Nurse Practitioners) who all work together to provide you with the care you need, when you need it. You will need a follow up appointment in:  4 weeks WITH [name] Dopp, PA-C   Any Other Special Instructions Will Be Listed Below (If Applicable).

## 2018-05-22 ENCOUNTER — Telehealth (HOSPITAL_COMMUNITY): Payer: Self-pay | Admitting: *Deleted

## 2018-05-22 NOTE — Telephone Encounter (Signed)
Left message on voicemail per DPR in reference to upcoming appointment scheduled on 05/26/18 with detailed instructions given per Myocardial Perfusion Study Information Sheet for the test. LM to arrive 15 minutes early, and that it is imperative to arrive on time for appointment to keep from having the test rescheduled. If you need to cancel or reschedule your appointment, please call the office within 24 hours of your appointment. Failure to do so may result in a cancellation of your appointment, and a $50 no show fee. Phone number given for call back for any questions. Kirstie Peri

## 2018-05-23 ENCOUNTER — Encounter: Payer: BLUE CROSS/BLUE SHIELD | Admitting: Gastroenterology

## 2018-05-25 ENCOUNTER — Ambulatory Visit: Payer: BLUE CROSS/BLUE SHIELD | Admitting: Gastroenterology

## 2018-05-26 ENCOUNTER — Encounter (HOSPITAL_COMMUNITY): Payer: BLUE CROSS/BLUE SHIELD

## 2018-05-29 DIAGNOSIS — J0100 Acute maxillary sinusitis, unspecified: Secondary | ICD-10-CM | POA: Diagnosis not present

## 2018-06-07 ENCOUNTER — Telehealth (HOSPITAL_COMMUNITY): Payer: Self-pay | Admitting: Radiology

## 2018-06-07 NOTE — Telephone Encounter (Signed)
Patient given detailed instructions per Myocardial Perfusion Study Information Sheet for the test on 06/09/2018 at 7:45. Patient notified to arrive 15 minutes early and that it is imperative to arrive on time for appointment to keep from having the test rescheduled.  If you need to cancel or reschedule your appointment, please call the office within 24 hours of your appointment. . Patient verbalized understanding.EHK

## 2018-06-09 ENCOUNTER — Encounter: Payer: Self-pay | Admitting: Physician Assistant

## 2018-06-09 ENCOUNTER — Ambulatory Visit (HOSPITAL_COMMUNITY): Payer: BLUE CROSS/BLUE SHIELD | Attending: Cardiology

## 2018-06-09 DIAGNOSIS — I2510 Atherosclerotic heart disease of native coronary artery without angina pectoris: Secondary | ICD-10-CM | POA: Diagnosis not present

## 2018-06-09 LAB — MYOCARDIAL PERFUSION IMAGING
CHL CUP MPHR: 161 {beats}/min
Estimated workload: 10.1 METS
Exercise duration (min): 9 min
Exercise duration (sec): 0 s
LV sys vol: 19 mL
LVDIAVOL: 51 mL (ref 46–106)
Peak HR: 142 {beats}/min
Percent HR: 88 %
Rest HR: 83 {beats}/min
SDS: 2
SRS: 0
SSS: 2
TID: 1.02

## 2018-06-09 MED ORDER — TECHNETIUM TC 99M TETROFOSMIN IV KIT
31.9000 | PACK | Freq: Once | INTRAVENOUS | Status: AC | PRN
  Administered 2018-06-09: 31.9 via INTRAVENOUS
  Filled 2018-06-09: qty 32

## 2018-06-09 MED ORDER — TECHNETIUM TC 99M TETROFOSMIN IV KIT
10.6000 | PACK | Freq: Once | INTRAVENOUS | Status: AC | PRN
  Administered 2018-06-09: 10.6 via INTRAVENOUS
  Filled 2018-06-09: qty 11

## 2018-06-12 ENCOUNTER — Encounter: Payer: Self-pay | Admitting: Gastroenterology

## 2018-06-12 ENCOUNTER — Telehealth: Payer: Self-pay | Admitting: Physician Assistant

## 2018-06-12 NOTE — Telephone Encounter (Signed)
New Message:    Patient returning call for last week concerning ECHO. Please call patient back.

## 2018-06-14 ENCOUNTER — Encounter: Payer: Self-pay | Admitting: Physician Assistant

## 2018-06-14 ENCOUNTER — Ambulatory Visit: Payer: BLUE CROSS/BLUE SHIELD | Admitting: Physician Assistant

## 2018-06-14 VITALS — BP 108/56 | HR 84 | Ht 63.0 in | Wt 143.1 lb

## 2018-06-14 DIAGNOSIS — I25119 Atherosclerotic heart disease of native coronary artery with unspecified angina pectoris: Secondary | ICD-10-CM | POA: Diagnosis not present

## 2018-06-14 DIAGNOSIS — K219 Gastro-esophageal reflux disease without esophagitis: Secondary | ICD-10-CM

## 2018-06-14 DIAGNOSIS — I10 Essential (primary) hypertension: Secondary | ICD-10-CM

## 2018-06-14 NOTE — Progress Notes (Signed)
Cardiology Office Note:    Date:  06/14/2018   ID:  [name], DOB January 29, 1961, MRN [id number]  PCP:  [name] Sake, MD  Cardiologist:  [name] Dawley, MD   Electrophysiologist:  None  GI: [name]  Referring MD: [name] Sake, MD   Chief Complaint  Patient presents with  . Follow-up    CAD, Chest Pain     History of Present Illness:    [name] is a 58 y.o. female with Crohn's disease, GERD, hypertension, diabetes, HL, palpitations, CAD and strong FHx of CAD. She suffered a NSTEMI in 11/18 treated with a DES to the proximal LAD.  She was last seen 05/16/18 for the evaluation of chest pain.  I placed her on Isosorbide and had her do a Nuclear stress test.  The stress test demonstrated no ischemia and was low risk.     [name] returns for follow up. She is here alone.  She overall feels better on the Isosorbide.  She still has some epigastric pain at times.  She does get short of breath at times.  She has not had paroxysmal nocturnal dyspnea, lower extremity swelling or syncope.  She is due for colonoscopy soon with gastroenterology.  Prior CV studies:   The following studies were reviewed today:  Myoview 06/09/2018 EF 63, Ex for 9'/10.1 METs, normal study, Low Risk  Cardiac Catheterization 04/18/17 LM normal LAD prox 65 (FFR 0.82) RI normal LCx normal  RCA mild irregs EF 50-55, ant HK PCI:  3 x 12 Promus Premier DES to prox LAD          Echo 04/18/17 EF 55-60, mild ant-sept HK, Gr 1 DD, PASP 35  Myoview 03/29/16  Nuclear stress EF: 55%.  There was no ST segment deviation noted during stress.  The study is normal.  This is a low risk study.  The left ventricular ejection fraction is normal (55-65%).  Coronary CTA 11/12 Left Main: No plaque or stenosis Left Anterior Descending: Mild nonobstructive mixed plaque in the mid LAD. Left Circumflex: Moderate sized ramus with no plaque or stenosis. The CFX itself with a small vessel with no  plaque or stenosis. Right Coronary Artery: Dominant vessel, no plaque or stenosis. Coronary Calcium Score: 7 Agatston units IMPRESSION: 1. No obstructive coronary disease. There was mild nonobstructive plaque in the mid LAD. 2. Coronary calcium score of 4 Agatston units places the patient in the [place] percentile for her age and gender. This is a high risk category for future cardiac events. I would suggest ASA 81 mg daily and aggressive lowering of LDL cholesterol to ideally <70 but at least < 100.  Myoview 3/11 EF 67, no ischemia  Past Medical History:  Diagnosis Date  . Allergic rhinitis due to pollen   . Anxiety state, unspecified   . Arthritis   . Bronchitis    hx  . CAD (coronary artery disease)    s/p NSTEMI in 11/18 treated with a DES to the proximal LAD  . Calculus of gallbladder without mention of cholecystitis or obstruction   . Crohn disease ([place])   . Depressive disorder, not elsewhere classified   . Diabetes mellitus without complication (HCC)    diet controlled  . Esophageal reflux   . Fibromyalgia   . Headache   . History of nuclear stress test    ETT-Myoview 10/17: EF 55%, normal perfusion; Low Risk // Nuclear stress test 05/2018:  EF 63, normal perfusion; Low Risk  . Myalgia  and myositis, unspecified   . NSTEMI (non-ST elevated myocardial infarction) ([place]) 04/15/2017  . Other and unspecified hyperlipidemia   . Palpitations   . Pneumonia 15   hx  . S/P angioplasty with stent 04/18/17 with DES to pLAD 04/19/2017  . Unspecified essential hypertension   . Vitamin B deficiency    Surgical Hx: The patient  has a past surgical history that includes Tonsillectomy and adenoidectomy; Tubal ligation; Exploratory laparotomy; Esophagogastroduodenoscopy; Cholecystectomy (N/A, 09/19/2014); LEFT HEART CATH AND CORONARY ANGIOGRAPHY (N/A, 04/18/2017); INTRAVASCULAR PRESSURE WIRE/FFR STUDY (N/A, 04/18/2017); and CORONARY STENT INTERVENTION (N/A, 04/18/2017).   Current  Medications: Current Meds  Medication Sig  . albuterol (PROVENTIL) (2.5 MG/3ML) 0.083% nebulizer solution Take 2.5 mg by nebulization every 4 (four) hours as needed.   Marland Kitchen aspirin 81 MG chewable tablet Chew 81 mg by mouth daily.    . budesonide (ENTOCORT EC) 3 MG 24 hr capsule Take 3 capsules (9 mg total) by mouth daily.  . DULoxetine (CYMBALTA) 60 MG capsule Take 60 mg by mouth daily.  . enalapril (VASOTEC) 5 MG tablet Take 1 tablet (5 mg total) by mouth daily.  Marland Kitchen EPIPEN 2-PAK 0.3 MG/0.3ML SOAJ injection Reported on 06/27/2015  . fenofibrate (TRICOR) 145 MG tablet TAKE ONE TABLET BY MOUTH DAILY  . isosorbide mononitrate (IMDUR) 30 MG 24 hr tablet Take 0.5 tablets (15 mg total) by mouth daily.  . metFORMIN (GLUCOPHAGE) 500 MG tablet Take 1 tablet (500 mg total) by mouth 2 (two) times daily with a meal.  . metoprolol succinate (TOPROL-XL) 50 MG 24 hr tablet TAKE ONE TABLET BY MOUTH DAILY IMMEDIATELY FOLLOWING A MEAL  . nitroGLYCERIN (NITROSTAT) 0.4 MG SL tablet Place 0.4 mg under the tongue every 5 (five) minutes as needed for chest pain.  . pantoprazole (PROTONIX) 40 MG tablet TAKE ONE TABLET BY MOUTH DAILY  . Potassium 99 MG TABS Take 99 mg by mouth at bedtime.  . pravastatin (PRAVACHOL) 20 MG tablet Take 20 mg by mouth at bedtime.   . vedolizumab (ENTYVIO) 300 MG injection Inject 300 mg into the vein every 8 (eight) weeks. After the standard induction  . Vitamin D3 (VITAMIN D) 25 MCG tablet Take 1,000 Units by mouth at bedtime.   Current Facility-Administered Medications for the 06/14/18 encounter (Office Visit) with [name] Dopp T, PA-C  Medication  . 0.9 %  sodium chloride infusion  . nitroGLYCERIN (NITROSTAT) SL tablet 0.3 mg     Allergies:   Lipitor [atorvastatin]; Remicade [infliximab]; and Ticagrelor   Social History   Tobacco Use  . Smoking status: Former Smoker    Packs/day: 1.00    Years: 35.00    Pack years: 35.00    Types: Cigarettes    Last attempt to quit: 05/31/2004     Years since quitting: 14.0  . Smokeless tobacco: Never Used  Substance Use Topics  . Alcohol use: No  . Drug use: No     Family Hx: The patient's family history includes Coronary artery disease in her brother; Heart attack in her mother; Heart disease in her brother, mother, and sister; Hypertension in her brother, mother, and sister; Lung cancer in her maternal grandmother; Stroke in her father. There is no history of Colon cancer or Stomach cancer.  ROS:   Please see the history of present illness.    Review of Systems  Constitution: Positive for diaphoresis and malaise/fatigue.  Respiratory: Positive for cough and snoring.   Musculoskeletal: Positive for joint pain.  Gastrointestinal: Positive for abdominal pain and  nausea.  Neurological: Positive for headaches.   All other systems reviewed and are negative.   EKGs/Labs/Other Test Reviewed:    EKG:  EKG is not ordered today.    Recent Labs: 02/20/2018: ALT 75; BUN 19; Creatinine, Ser 0.69; Hemoglobin 14.2; Platelets 321.0; Potassium 4.9; Sodium 139   Recent Lipid Panel Lab Results  Component Value Date/Time   CHOL 247 (H) 12/18/2015 10:03 AM   TRIG (H) 12/18/2015 10:03 AM    946.0 Triglyceride is over 400; calculations on Lipids are invalid.   HDL 34.10 (L) 12/18/2015 10:03 AM   CHOLHDL 7 12/18/2015 10:03 AM   LDLCALC 127 08/27/2013   LDLDIRECT 85.0 12/18/2015 10:03 AM    From [facility] Hemoglobin 14.100 G/ 03/30/2018 Creatinine, Serum 0.780 MG/ 05/01/2018 Potassium 4.900 02/20/2018 ALT (SGPT) 73.000 IU/ 05/01/2018  Physical Exam:    VS:  BP (!) 108/56   Pulse 84   Ht 5' 3"  (1.6 m)   Wt 143 lb 1.9 oz (64.9 kg)   LMP  (LMP Unknown)   SpO2 94%   BMI 25.35 kg/m     Wt Readings from Last 3 Encounters:  06/14/18 143 lb 1.9 oz (64.9 kg)  06/09/18 141 lb (64 kg)  05/16/18 141 lb 12.8 oz (64.3 kg)     Physical Exam  Constitutional: She is oriented to person, place, and time. She appears well-developed and  well-nourished. No distress.  HENT:  Head: Normocephalic and atraumatic.  Neck: No JVD present.  Cardiovascular: Normal rate and regular rhythm.  No murmur heard. Pulmonary/Chest: Effort normal. She has no rales.  Abdominal: Soft. She exhibits no distension.  Musculoskeletal:        General: No edema.  Neurological: She is alert and oriented to person, place, and time.  Skin: Skin is warm and dry.    ASSESSMENT & PLAN:    Coronary artery disease involving native coronary artery of native heart with angina pectoris [facility]) History of NSTEMI November 2018 treated with a drug-eluting stent to the proximal LAD.  Recent nuclear stress test low risk.  She is currently stable with improved symptoms on aspirin, metoprolol, isosorbide, pravastatin.  Continue current therapy.  Essential hypertension The patient's blood pressure is controlled on her current regimen.  Continue current therapy.   Gastroesophageal reflux disease without esophagitis Some of her symptoms may be related to acid reflux disease.  She has a gastroenterologist and is actually scheduled for colonoscopy soon.  Continue follow-up with gastroenterology as planned.   Dispo:  Return in about 6 months (around 12/13/2018).   Medication Adjustments/Labs and Tests Ordered: Current medicines are reviewed at length with the patient today.  Concerns regarding medicines are outlined above.  Tests Ordered: No orders of the defined types were placed in this encounter.  Medication Changes: No orders of the defined types were placed in this encounter.   Signed, [name] Dopp, PA-C  06/14/2018 3:19 PM    [facility] [address], [place], [place]  [address] Phone: [phone]; Fax: [phone]

## 2018-06-14 NOTE — Telephone Encounter (Signed)
Left detailed message (DPR on File)  for patient about Stress test results and reminder of appt today @ 2:45 pm. A copy has been sent to pt's primary care doctor.

## 2018-06-14 NOTE — Patient Instructions (Signed)
Medication Instructions:  Your physician recommends that you continue on your current medications as directed. Please refer to the Current Medication list given to you today.  If you need a refill on your cardiac medications before your next appointment, please call your pharmacy.   Lab work: NONE If you have labs (blood work) drawn today and your tests are completely normal, you will receive your results only by: Marland Kitchen MyChart Message (if you have MyChart) OR . A paper copy in the mail If you have any lab test that is abnormal or we need to change your treatment, we will call you to review the results.  Testing/Procedures: NONE  Follow-Up: At [facility], you and your health needs are our priority.  As part of our continuing mission to provide you with exceptional heart care, we have created designated Provider Care Teams.  These Care Teams include your primary Cardiologist (physician) and Advanced Practice Providers (APPs -  Physician Assistants and Nurse Practitioners) who all work together to provide you with the care you need, when you need it. You will need a follow up appointment in 6 months.  Please call our office 2 months in advance to schedule this appointment.  You may see Ena Dawley, MD or one of the following Advanced Practice Providers on your designated Care Team:   [place], PA-C Melina Copa, PA-C . Ermalinda Barrios, PA-C  Any Other Special Instructions Will Be Listed Below (If Applicable).

## 2018-06-14 NOTE — Telephone Encounter (Signed)
-----   Message from Liliane Shi, [place] sent at 06/09/2018  1:37 PM EST ----- Please call patient Stress test is normal. Recommendations:  - Continue current medications and follow up as planned.   - Send copy to PCP Richardson Dopp, PA-C    06/09/2018 1:35 PM

## 2018-06-23 ENCOUNTER — Ambulatory Visit (AMBULATORY_SURGERY_CENTER): Payer: Self-pay

## 2018-06-23 ENCOUNTER — Encounter: Payer: Self-pay | Admitting: Gastroenterology

## 2018-06-23 VITALS — Ht 63.0 in | Wt 143.6 lb

## 2018-06-23 DIAGNOSIS — K5080 Crohn's disease of both small and large intestine without complications: Secondary | ICD-10-CM

## 2018-06-23 DIAGNOSIS — L539 Erythematous condition, unspecified: Secondary | ICD-10-CM | POA: Diagnosis not present

## 2018-06-23 MED ORDER — NA SULFATE-K SULFATE-MG SULF 17.5-3.13-1.6 GM/177ML PO SOLN: 1.0000 | Freq: Once | ORAL | 0 refills | Status: AC

## 2018-06-23 NOTE — Progress Notes (Signed)
Denies allergies to eggs or soy products. Denies complication of anesthesia or sedation. Denies use of weight loss medication. Denies use of O2.   Emmi instructions declined.   Patient did not get a coupon for Suprep because she told me that she can get it free from work.

## 2018-06-28 ENCOUNTER — Encounter: Payer: Self-pay | Admitting: Gastroenterology

## 2018-06-28 ENCOUNTER — Telehealth: Payer: Self-pay | Admitting: Gastroenterology

## 2018-06-28 DIAGNOSIS — K50813 Crohn's disease of both small and large intestine with fistula: Secondary | ICD-10-CM | POA: Diagnosis not present

## 2018-06-28 NOTE — Telephone Encounter (Signed)
Pt sched for colon 2.7.20.  Pt would like to know whether she needs her Entyvio infusion before then.

## 2018-06-28 NOTE — Telephone Encounter (Signed)
Encouraged to get her Entyvio infusion as scheduled.

## 2018-07-07 ENCOUNTER — Encounter: Payer: Self-pay | Admitting: Gastroenterology

## 2018-07-07 ENCOUNTER — Ambulatory Visit (AMBULATORY_SURGERY_CENTER): Payer: BLUE CROSS/BLUE SHIELD | Admitting: Gastroenterology

## 2018-07-07 VITALS — BP 102/43 | HR 78 | Temp 96.8°F | Resp 20 | Ht 63.0 in | Wt 143.0 lb

## 2018-07-07 DIAGNOSIS — D120 Benign neoplasm of cecum: Secondary | ICD-10-CM

## 2018-07-07 DIAGNOSIS — K50819 Crohn's disease of both small and large intestine with unspecified complications: Secondary | ICD-10-CM

## 2018-07-07 DIAGNOSIS — Z1211 Encounter for screening for malignant neoplasm of colon: Secondary | ICD-10-CM | POA: Diagnosis not present

## 2018-07-07 MED ORDER — BUDESONIDE 3 MG PO CPEP: 9.0000 mg | ORAL_CAPSULE | Freq: Every day | ORAL | 2 refills | Status: DC

## 2018-07-07 MED ORDER — SODIUM CHLORIDE 0.9 % IV SOLN: 500.0000 mL | Freq: Once | INTRAVENOUS | Status: DC

## 2018-07-07 NOTE — Op Note (Signed)
[facility] Patient Name: [name] Procedure Date: 07/07/2018 9:45 AM MRN: [id number] Endoscopist: Mauri Pole , MD Age: 58 Referring MD:  Date of Birth: 10/17/1960 Gender: Female Account #: [id number] Procedure:                Colonoscopy Indications:              High risk colon cancer surveillance: Crohn's small                            and large intestine Medicines:                Monitored Anesthesia Care Procedure:                Pre-Anesthesia Assessment:                           - Prior to the procedure, a History and Physical                            was performed, and patient medications and                            allergies were reviewed. The patient's tolerance of                            previous anesthesia was also reviewed. The risks                            and benefits of the procedure and the sedation                            options and risks were discussed with the patient.                            All questions were answered, and informed consent                            was obtained. Prior Anticoagulants: The patient has                            taken no previous anticoagulant or antiplatelet                            agents. ASA Grade Assessment: II - A patient with                            mild systemic disease. After reviewing the risks                            and benefits, the patient was deemed in                            satisfactory condition to undergo the procedure.  After obtaining informed consent, the colonoscope                            was passed under direct vision. Throughout the                            procedure, the patient's blood pressure, pulse, and                            oxygen saturations were monitored continuously. The                            Colonoscope was introduced through the anus and                            advanced to the the terminal ileum, with                             identification of the appendiceal orifice and IC                            valve. The colonoscopy was performed without                            difficulty. The patient tolerated the procedure                            well. The quality of the bowel preparation was                            excellent. The terminal ileum, ileocecal valve,                            appendiceal orifice, and rectum were photographed. Scope In: 10:03:25 AM Scope Out: 10:21:51 AM Scope Withdrawal Time: 0 hours 14 minutes 11 seconds  Total Procedure Duration: 0 hours 18 minutes 26 seconds  Findings:                 The perianal and digital rectal examinations were                            normal.                           Flat mucosa in terminal ileum with altered                            vascularity and scarring. A patchy area of mucosa                            in the terminal ileum was nodular. Biopsies were                            taken with a cold forceps for histology. Small  diverticula in teh terminal ileum                           A localized area of nodular polypoid mucosa was                            found at the ileocecal valve. Biopsies were taken                            with a cold forceps for histology.                           A patchy area of mildly erythematous mucosa was                            found in the distal rectum. Biopsies were taken                            with a cold forceps for histology.                           The exam was otherwise without abnormality. Complications:            No immediate complications. Estimated Blood Loss:     Estimated blood loss was minimal. Impression:               - Nodular ileal mucosa. Biopsied.                           - Nodular mucosa at the ileocecal valve. Biopsied.                           - Erythematous mucosa in the distal rectum.                             Biopsied.                           - The examination was otherwise normal. Recommendation:           - Patient has a contact number available for                            emergencies. The signs and symptoms of potential                            delayed complications were discussed with the                            patient. Return to normal activities tomorrow.                            Written discharge instructions were provided to the                            patient.                           -  Resume previous diet.                           - Continue present medications.                           - Await pathology results.                           - No aspirin, ibuprofen, naproxen, or other                            non-steroidal anti-inflammatory drugs.                           - Use Entocort EC (budesonide) 9 mg PO one time per                            day.                           - Return to my office in 2 months. Mauri Pole, MD 07/07/2018 10:29:00 AM This report has been signed electronically.

## 2018-07-07 NOTE — Patient Instructions (Signed)
Report:  No Aspirin, ibuprofen, NSAIDS, Aleve, etc.    Entocort EC 68m one time per day.  Return to office for visit in 2 months.  [facility]   Refer to the procedure report that was given to you for any specific questions about what was found during the examination.  If the procedure report does not answer your questions, please call your gastroenterologist to clarify.  If you requested that your care partner not be given the details of your procedure findings, then the procedure report has been included in a sealed envelope for you to review at your convenience later.  YOU SHOULD EXPECT: Some feelings of bloating in the abdomen. Passage of more gas than usual.  Walking can help get rid of the air that was put into your GI tract during the procedure and reduce the bloating. If you had a lower endoscopy (such as a colonoscopy or flexible sigmoidoscopy) you may notice spotting of blood in your stool or on the toilet paper. If you underwent a bowel prep for your procedure, you may not have a normal bowel movement for a few days.  Please Note:  You might notice some irritation and congestion in your nose or some drainage.  This is from the oxygen used during your procedure.  There is no need for concern and it should clear up in a day or so.  SYMPTOMS TO REPORT IMMEDIATELY:   Following lower endoscopy (colonoscopy or flexible sigmoidoscopy):  Excessive amounts of blood in the stool  Significant tenderness or worsening of abdominal pains  Swelling of the abdomen that is new, acute  Fever of 100F or higher  For urgent or emergent issues, a gastroenterologist can be reached at any hour by calling [phone]   DIET:  We do recommend a small meal at first, but then you may proceed to your regular diet.  Drink plenty of fluids but you should avoid alcoholic beverages for 24 hours.  ACTIVITY:  You should plan to take it easy for the  rest of today and you should NOT DRIVE or use heavy machinery until tomorrow (because of the sedation medicines used during the test).    FOLLOW UP: Our staff will call the number listed on your records the next business day following your procedure to check on you and address any questions or concerns that you may have regarding the information given to you following your procedure. If we do not reach you, we will leave a message.  However, if you are feeling well and you are not experiencing any problems, there is no need to return our call.  We will assume that you have returned to your regular daily activities without incident.  If any biopsies were taken you will be contacted by phone or by letter within the next 1-3 weeks.  Please call [place] [phone] you have not heard about the biopsies in 3 weeks.    SIGNATURES/CONFIDENTIALITY: You and/or your care partner have signed paperwork which will be entered into your electronic medical record.  These signatures attest to the fact that that the information above on your After Visit Summary has been reviewed and is understood.  Full responsibility of the confidentiality of this discharge information lies with you and/or your care-partner.

## 2018-07-07 NOTE — Progress Notes (Signed)
A and O x3. Report to RN. Tolerated MAC anesthesia well.

## 2018-07-07 NOTE — Progress Notes (Signed)
Called to room to assist during endoscopic procedure.  Patient ID and intended procedure confirmed with present staff. Received instructions for my participation in the procedure from the performing physician.  

## 2018-07-10 ENCOUNTER — Telehealth: Payer: Self-pay

## 2018-07-10 NOTE — Telephone Encounter (Signed)
  Follow up Call-  Call back number 07/07/2018 05/10/2018  Post procedure Call Back phone  # [phone]  Permission to leave phone message Yes Yes  Some recent data might be hidden     Patient questions:  Do you have a fever, pain , or abdominal swelling? No. Pain Score  0 *  Have you tolerated food without any problems? Yes.    Have you been able to return to your normal activities? Yes.    Do you have any questions about your discharge instructions: Diet   No. Medications  No. Follow up visit  No.  Do you have questions or concerns about your Care? No.  Actions: * If pain score is 4 or above: No action needed, pain <4.

## 2018-07-19 ENCOUNTER — Other Ambulatory Visit: Payer: Self-pay

## 2018-07-19 DIAGNOSIS — D126 Benign neoplasm of colon, unspecified: Secondary | ICD-10-CM

## 2018-07-19 DIAGNOSIS — K50819 Crohn's disease of both small and large intestine with unspecified complications: Secondary | ICD-10-CM

## 2018-08-01 ENCOUNTER — Other Ambulatory Visit: Payer: Self-pay | Admitting: Internal Medicine

## 2018-08-01 NOTE — Telephone Encounter (Signed)
Please review for refill.  

## 2018-08-07 ENCOUNTER — Other Ambulatory Visit: Payer: Self-pay | Admitting: Physician Assistant

## 2018-08-14 ENCOUNTER — Telehealth: Payer: Self-pay | Admitting: Gastroenterology

## 2018-08-14 ENCOUNTER — Other Ambulatory Visit: Payer: Self-pay

## 2018-08-14 MED ORDER — NA SULFATE-K SULFATE-MG SULF 17.5-3.13-1.6 GM/177ML PO SOLN: ORAL | 0 refills | Status: DC

## 2018-08-14 NOTE — Telephone Encounter (Signed)
Pt has not received suprep yet. Her procedure is on 3/24 at [facility].

## 2018-08-14 NOTE — Telephone Encounter (Signed)
Rx sent to the pharmacy. Patient is aware.

## 2018-08-16 ENCOUNTER — Telehealth: Payer: Self-pay

## 2018-08-16 NOTE — Telephone Encounter (Signed)
Left message for the patient that due to the current COVID-19 guidelines the colonoscopy planned for 08/22/18 will be postponed.

## 2018-08-17 NOTE — Telephone Encounter (Signed)
Patient calls back to confirm she received the message and understands why this is necessary.

## 2018-08-22 ENCOUNTER — Ambulatory Visit (HOSPITAL_COMMUNITY)
Admission: RE | Admit: 2018-08-22 | Payer: BLUE CROSS/BLUE SHIELD | Source: Home / Self Care | Admitting: Gastroenterology

## 2018-08-22 ENCOUNTER — Encounter (HOSPITAL_COMMUNITY): Admission: RE | Payer: Self-pay | Source: Home / Self Care

## 2018-08-22 SURGERY — COLONOSCOPY WITH PROPOFOL
Anesthesia: Monitor Anesthesia Care

## 2018-09-01 ENCOUNTER — Ambulatory Visit: Payer: BLUE CROSS/BLUE SHIELD | Admitting: Gastroenterology

## 2018-09-01 DIAGNOSIS — K50813 Crohn's disease of both small and large intestine with fistula: Secondary | ICD-10-CM | POA: Diagnosis not present

## 2018-10-16 ENCOUNTER — Encounter: Payer: Self-pay | Admitting: Internal Medicine

## 2018-10-27 ENCOUNTER — Telehealth: Payer: Self-pay | Admitting: Physician Assistant

## 2018-10-27 ENCOUNTER — Telehealth: Payer: Self-pay | Admitting: *Deleted

## 2018-10-27 DIAGNOSIS — I25119 Atherosclerotic heart disease of native coronary artery with unspecified angina pectoris: Secondary | ICD-10-CM

## 2018-10-27 DIAGNOSIS — E782 Mixed hyperlipidemia: Secondary | ICD-10-CM

## 2018-10-27 MED ORDER — ROSUVASTATIN CALCIUM 10 MG PO TABS: 10.0000 mg | ORAL_TABLET | Freq: Every day | ORAL | 1 refills | Status: DC

## 2018-10-27 NOTE — Telephone Encounter (Signed)
Reviewed labs sent from employer. Kidney function, potassium, Hgb normal. ALT mildly elevated. LDL above goal (her LDL is 121 and the goal is < 70). PLAN:  1. The patient should be followed by a gastroenterologist for elevated LFTs.   2. DC Pravastatin. 3. Start Crestor 10 mg once daily  4. Check LFTs 4 weeks after starting Crestor. 5. Arrange Fasting Lipids and LFTs in 3 mos. Richardson Dopp, PA-C    10/27/2018 2:20 PM

## 2018-10-27 NOTE — Telephone Encounter (Signed)
SPOKE WITH PT AND  AWARE OF RESULTS  PT WILL D/C PRAVASTATIN AND START CRESTOR 10 MG ONCE A DAY   PT STATED SHE TRIED CRESTOR BEFORE BUT IT CAUSE ACHES IN HER BONES. PT SAID THAT SHE BE WILLING TO TRY ONCE A AGAIN TO SEE IF CHANGES OF SIDE EFFECT WITH HER.  HER ALT IS BEING MONITORED AND HAS BEEN  GETTING BETTER LAST LAB PT HAD DONE RANGES WERE HIGHER PER PT   PT WOULD LIKE FOR 4 WEEKS AND 3 MONTHS LAB REQUISITIONS TO BE FAXED TO HER JOB LAB BECAUSE ITLL BE FREE FOR PT

## 2018-10-30 NOTE — Telephone Encounter (Signed)
Ok.  Can you send the requisitions to her employer? Richardson Dopp, PA-C    10/30/2018 8:54 AM

## 2018-10-31 ENCOUNTER — Other Ambulatory Visit: Payer: Self-pay

## 2018-10-31 ENCOUNTER — Telehealth: Payer: Self-pay | Admitting: Gastroenterology

## 2018-10-31 DIAGNOSIS — Z86010 Personal history of colonic polyps: Secondary | ICD-10-CM

## 2018-10-31 MED ORDER — NA SULFATE-K SULFATE-MG SULF 17.5-3.13-1.6 GM/177ML PO SOLN: ORAL | 0 refills | Status: DC

## 2018-10-31 NOTE — Telephone Encounter (Signed)
Patient is scheduled and advised.  12/05/18 with pre-testing for COVID 19 on 11/30/18. Understands the quarantine restrictions after testing. She may require a note for her employer and will let us know.

## 2018-10-31 NOTE — Telephone Encounter (Signed)
Pt would like to schedule colonoscopy at [facility] due to pathology findings from February 2020 colon.

## 2018-11-02 DIAGNOSIS — K50813 Crohn's disease of both small and large intestine with fistula: Secondary | ICD-10-CM | POA: Diagnosis not present

## 2018-11-07 ENCOUNTER — Telehealth: Payer: Self-pay | Admitting: *Deleted

## 2018-11-07 DIAGNOSIS — R7989 Other specified abnormal findings of blood chemistry: Secondary | ICD-10-CM

## 2018-11-07 NOTE — Telephone Encounter (Signed)
Called patient she is aware that she needs her follow up LFT's drawn and will ne in today or tomorrow to have them drawn

## 2018-11-08 ENCOUNTER — Other Ambulatory Visit: Payer: BC Managed Care – PPO

## 2018-11-08 DIAGNOSIS — R7989 Other specified abnormal findings of blood chemistry: Secondary | ICD-10-CM

## 2018-11-08 LAB — HEPATIC FUNCTION PANEL
ALT: 41 U/L — ABNORMAL HIGH (ref 0–35)
AST: 32 U/L (ref 0–37)
Albumin: 4.2 g/dL (ref 3.5–5.2)
Alkaline Phosphatase: 71 U/L (ref 39–117)
Bilirubin, Direct: 0.1 mg/dL (ref 0.0–0.3)
Total Bilirubin: 0.5 mg/dL (ref 0.2–1.2)
Total Protein: 6.5 g/dL (ref 6.0–8.3)

## 2018-11-16 ENCOUNTER — Telehealth: Payer: Self-pay | Admitting: Gastroenterology

## 2018-11-16 NOTE — Telephone Encounter (Signed)
Spoke to the patient and told the patient that she was the only one that absolutely needed to be quarantined but if the husband could adhere to the CDC's guidelines for the safety of the patient while in quarantine.

## 2018-11-16 NOTE — Telephone Encounter (Signed)
Pt is scheduled for a procedure 7/7 and has to have COVID-19 test 7/2--pt wanted to know whether her husband has to quarantine as well or if he is able to go to work.

## 2018-11-22 ENCOUNTER — Ambulatory Visit: Payer: BLUE CROSS/BLUE SHIELD | Admitting: Dietician

## 2018-11-30 ENCOUNTER — Ambulatory Visit: Payer: BLUE CROSS/BLUE SHIELD | Admitting: Dietician

## 2018-11-30 ENCOUNTER — Other Ambulatory Visit (HOSPITAL_COMMUNITY): Payer: BLUE CROSS/BLUE SHIELD

## 2018-11-30 ENCOUNTER — Other Ambulatory Visit (HOSPITAL_COMMUNITY)
Admission: RE | Admit: 2018-11-30 | Discharge: 2018-11-30 | Disposition: A | Discharge status: Home / Self Care | Payer: BC Managed Care – PPO | Source: Ambulatory Visit | Attending: Gastroenterology | Admitting: Gastroenterology

## 2018-11-30 DIAGNOSIS — Z01812 Encounter for preprocedural laboratory examination: Secondary | ICD-10-CM | POA: Diagnosis not present

## 2018-11-30 DIAGNOSIS — Z1159 Encounter for screening for other viral diseases: Secondary | ICD-10-CM | POA: Diagnosis not present

## 2018-11-30 LAB — SARS CORONAVIRUS 2 (TAT 6-24 HRS): SARS Coronavirus 2: NEGATIVE

## 2018-12-04 ENCOUNTER — Encounter (HOSPITAL_COMMUNITY): Payer: Self-pay

## 2018-12-04 NOTE — Progress Notes (Signed)
Unable to reach pt. Left voicemail for a return phone call.

## 2018-12-04 NOTE — Progress Notes (Signed)
Attempted to talk with patient regarding procedure tomorrow with Endoscopy, no answer and automated voicemail.

## 2018-12-05 ENCOUNTER — Encounter (HOSPITAL_COMMUNITY): Payer: Self-pay | Admitting: *Deleted

## 2018-12-05 ENCOUNTER — Encounter (HOSPITAL_COMMUNITY)
Admission: RE | Disposition: A | Discharge status: Home / Self Care | Payer: Self-pay | Source: Home / Self Care | Attending: Gastroenterology

## 2018-12-05 ENCOUNTER — Ambulatory Visit (HOSPITAL_COMMUNITY)
Admission: RE | Admit: 2018-12-05 | Discharge: 2018-12-05 | Disposition: A | Discharge status: Home / Self Care | Payer: BC Managed Care – PPO | Attending: Gastroenterology | Admitting: Gastroenterology

## 2018-12-05 ENCOUNTER — Ambulatory Visit (HOSPITAL_COMMUNITY): Payer: BC Managed Care – PPO | Admitting: Anesthesiology

## 2018-12-05 DIAGNOSIS — K219 Gastro-esophageal reflux disease without esophagitis: Secondary | ICD-10-CM | POA: Insufficient documentation

## 2018-12-05 DIAGNOSIS — I2510 Atherosclerotic heart disease of native coronary artery without angina pectoris: Secondary | ICD-10-CM | POA: Diagnosis not present

## 2018-12-05 DIAGNOSIS — Z888 Allergy status to other drugs, medicaments and biological substances status: Secondary | ICD-10-CM | POA: Insufficient documentation

## 2018-12-05 DIAGNOSIS — I252 Old myocardial infarction: Secondary | ICD-10-CM | POA: Diagnosis not present

## 2018-12-05 DIAGNOSIS — D123 Benign neoplasm of transverse colon: Secondary | ICD-10-CM | POA: Diagnosis not present

## 2018-12-05 DIAGNOSIS — F329 Major depressive disorder, single episode, unspecified: Secondary | ICD-10-CM | POA: Insufficient documentation

## 2018-12-05 DIAGNOSIS — M797 Fibromyalgia: Secondary | ICD-10-CM | POA: Insufficient documentation

## 2018-12-05 DIAGNOSIS — K5000 Crohn's disease of small intestine without complications: Secondary | ICD-10-CM | POA: Insufficient documentation

## 2018-12-05 DIAGNOSIS — Z79899 Other long term (current) drug therapy: Secondary | ICD-10-CM | POA: Diagnosis not present

## 2018-12-05 DIAGNOSIS — Z7984 Long term (current) use of oral hypoglycemic drugs: Secondary | ICD-10-CM | POA: Insufficient documentation

## 2018-12-05 DIAGNOSIS — K635 Polyp of colon: Secondary | ICD-10-CM

## 2018-12-05 DIAGNOSIS — M1990 Unspecified osteoarthritis, unspecified site: Secondary | ICD-10-CM | POA: Diagnosis not present

## 2018-12-05 DIAGNOSIS — K529 Noninfective gastroenteritis and colitis, unspecified: Secondary | ICD-10-CM | POA: Insufficient documentation

## 2018-12-05 DIAGNOSIS — Z87891 Personal history of nicotine dependence: Secondary | ICD-10-CM | POA: Diagnosis not present

## 2018-12-05 DIAGNOSIS — E785 Hyperlipidemia, unspecified: Secondary | ICD-10-CM | POA: Insufficient documentation

## 2018-12-05 DIAGNOSIS — Z7982 Long term (current) use of aspirin: Secondary | ICD-10-CM | POA: Insufficient documentation

## 2018-12-05 DIAGNOSIS — I10 Essential (primary) hypertension: Secondary | ICD-10-CM | POA: Diagnosis not present

## 2018-12-05 DIAGNOSIS — D120 Benign neoplasm of cecum: Secondary | ICD-10-CM

## 2018-12-05 DIAGNOSIS — Z955 Presence of coronary angioplasty implant and graft: Secondary | ICD-10-CM | POA: Insufficient documentation

## 2018-12-05 DIAGNOSIS — J301 Allergic rhinitis due to pollen: Secondary | ICD-10-CM | POA: Insufficient documentation

## 2018-12-05 DIAGNOSIS — Z860100 Personal history of colon polyps, unspecified: Secondary | ICD-10-CM

## 2018-12-05 DIAGNOSIS — G4730 Sleep apnea, unspecified: Secondary | ICD-10-CM | POA: Diagnosis not present

## 2018-12-05 DIAGNOSIS — E119 Type 2 diabetes mellitus without complications: Secondary | ICD-10-CM | POA: Diagnosis not present

## 2018-12-05 DIAGNOSIS — K580 Irritable bowel syndrome with diarrhea: Secondary | ICD-10-CM | POA: Diagnosis not present

## 2018-12-05 DIAGNOSIS — K648 Other hemorrhoids: Secondary | ICD-10-CM | POA: Insufficient documentation

## 2018-12-05 DIAGNOSIS — F419 Anxiety disorder, unspecified: Secondary | ICD-10-CM | POA: Insufficient documentation

## 2018-12-05 HISTORY — PX: SUBMUCOSAL LIFTING INJECTION: SHX6855

## 2018-12-05 HISTORY — PX: COLONOSCOPY WITH PROPOFOL: SHX5780

## 2018-12-05 HISTORY — DX: Other ill-defined heart diseases: I51.89

## 2018-12-05 HISTORY — PX: BIOPSY: SHX5522

## 2018-12-05 HISTORY — PX: POLYPECTOMY: SHX5525

## 2018-12-05 LAB — GLUCOSE, CAPILLARY: Glucose-Capillary: 197 mg/dL — ABNORMAL HIGH (ref 70–99)

## 2018-12-05 SURGERY — COLONOSCOPY WITH PROPOFOL
Anesthesia: Monitor Anesthesia Care

## 2018-12-05 MED ORDER — LACTATED RINGERS IV SOLN
INTRAVENOUS | Status: DC
  Administered 2018-12-05 (×2): via INTRAVENOUS

## 2018-12-05 MED ORDER — PROPOFOL 10 MG/ML IV BOLUS
INTRAVENOUS | Status: DC | PRN
  Administered 2018-12-05 (×4): 20 mg via INTRAVENOUS

## 2018-12-05 MED ORDER — SODIUM CHLORIDE 0.9 % IV SOLN: INTRAVENOUS | Status: DC

## 2018-12-05 MED ORDER — PROPOFOL 500 MG/50ML IV EMUL
INTRAVENOUS | Status: DC | PRN
  Administered 2018-12-05: 125 ug/kg/min via INTRAVENOUS

## 2018-12-05 MED ORDER — PROPOFOL 10 MG/ML IV BOLUS
INTRAVENOUS | Status: AC
  Filled 2018-12-05: qty 20

## 2018-12-05 MED ORDER — METHYLENE BLUE 0.5 % INJ SOLN
INTRAVENOUS | Status: AC
  Filled 2018-12-05: qty 10

## 2018-12-05 SURGICAL SUPPLY — 22 items

## 2018-12-05 NOTE — Transfer of Care (Signed)
Immediate Anesthesia Transfer of Care Note  Patient: [name]  Procedure(s) Performed: COLONOSCOPY WITH PROPOFOL (N/A ) SUBMUCOSAL LIFTING INJECTION POLYPECTOMY BIOPSY  Patient Location: PACU and Endoscopy Unit  Anesthesia Type:MAC  Level of Consciousness: awake, alert  and patient cooperative  Airway & Oxygen Therapy: Patient Spontanous Breathing and Patient connected to face mask oxygen  Post-op Assessment: Report given to RN and Post -op Vital signs reviewed and stable  Post vital signs: Reviewed and stable  Last Vitals:  Vitals Value Taken Time  BP    Temp    Pulse 71 12/05/18 1009  Resp 18 12/05/18 1009  SpO2 100 % 12/05/18 1009  Vitals shown include unvalidated device data.  Last Pain:  Vitals:   12/05/18 0812  TempSrc: Oral  PainSc: 0-No pain         Complications: No apparent anesthesia complications

## 2018-12-05 NOTE — H&P (Signed)
[facility] History and Physical   Primary Care Physician:  Leonides Sake, MD   Reason for Procedure:  Removal of flat polyp at ileocecal valve Plan:    Colonoscopy with polypectomy     HPI: [name] is a 58 y.o. female with crohn's disease, flat nodular mucosa at ileocecal valve biopsies positive for tubular adenoma. Given h/o IBD, will obtain additional biopsies and confirm complete removal of flat polyp/nodular mucosa with EMR.   Past Medical History:  Diagnosis Date  . Allergic rhinitis due to pollen   . Allergy   . Anxiety state, unspecified   . Arthritis   . Bronchitis    hx  . CAD (coronary artery disease)    s/p NSTEMI in 11/18 treated with a DES to the proximal LAD  . Calculus of gallbladder without mention of cholecystitis or obstruction   . Crohn disease ([facility])   . Depressive disorder, not elsewhere classified   . Diabetes mellitus without complication (HCC)    diet controlled  . Esophageal reflux   . Fibromyalgia   . Grade I diastolic dysfunction    Noted on ECHO  . Headache   . History of nuclear stress test    ETT-Myoview 10/17: EF 55%, normal perfusion; Low Risk // Nuclear stress test 05/2018:  EF 63, normal perfusion; Low Risk  . Myalgia and myositis, unspecified   . NSTEMI (non-ST elevated myocardial infarction) ([place]) 04/15/2017  . Other and unspecified hyperlipidemia   . Palpitations   . Pneumonia 13   hx  . S/P angioplasty with stent 04/18/17 with DES to pLAD 04/19/2017  . Seizures ([place])    1 seizure as a teenager   . Unspecified essential hypertension   . Vitamin B deficiency     Past Surgical History:  Procedure Laterality Date  . CHOLECYSTECTOMY N/A 09/19/2014   Procedure: LAPAROSCOPIC CHOLECYSTECTOMY WITH INTRAOPERATIVE CHOLANGIOGRAM;  Surgeon: Donnie Mesa, MD;  Location: [facility];  Service: General;  Laterality: N/A;  . CORONARY STENT INTERVENTION N/A 04/18/2017   Procedure: CORONARY STENT INTERVENTION;  Surgeon: Martinique,  Peter M, MD;  Location: [facility];  Service: Cardiovascular;  Laterality: N/A;  . ESOPHAGOGASTRODUODENOSCOPY    . EXPLORATORY LAPAROTOMY     for endometriosis  . INTRAVASCULAR PRESSURE WIRE/FFR STUDY N/A 04/18/2017   Procedure: INTRAVASCULAR PRESSURE WIRE/FFR STUDY;  Surgeon: Martinique, Peter M, MD;  [facility];  Service: Cardiovascular;  Laterality: N/A;  . LEFT HEART CATH AND CORONARY ANGIOGRAPHY N/A 04/18/2017   Procedure: LEFT HEART CATH AND CORONARY ANGIOGRAPHY;  Surgeon: Martinique, Peter M, MD;  Location: [facility];  Service: Cardiovascular;  Laterality: N/A;  . TONSILLECTOMY AND ADENOIDECTOMY    . TUBAL LIGATION     BILATERAL    Prior to Admission medications   Medication Sig Start Date End Date Taking? Authorizing Provider  aspirin 81 MG chewable tablet Chew 81 mg by mouth daily.     Yes [provider]  budesonide (ENTOCORT EC) 3 MG 24 hr capsule Take 3 capsules (9 mg total) by mouth daily. 07/07/18  Yes Nandigam, Venia Minks, MD  busPIRone (BUSPAR) 10 MG tablet Take 10 mg by mouth 2 (two) times daily.   Yes [provider]  colestipol (COLESTID) 1 g tablet Take 1 g by mouth 2 (two) times daily.   Yes [provider]  DULoxetine (CYMBALTA) 60 MG capsule Take 60 mg by mouth daily.   Yes [provider]  enalapril (VASOTEC) 5 MG  tablet Take 1 tablet (5 mg total) by mouth daily. 06/24/14  Yes Larey Dresser, MD  EPIPEN 2-PAK 0.3 MG/0.3ML SOAJ injection Inject 0.3 mg into the muscle as needed for anaphylaxis. Reported on 06/27/2015 10/09/14  Yes [provider]  fenofibrate (TRICOR) 145 MG tablet TAKE ONE TABLET BY MOUTH ONCE DAILY 08/07/18  Yes Bhagat, Bhavinkumar, PA  glimepiride (AMARYL) 2 MG tablet Take 2 mg by mouth 2 (two) times a day.   Yes [provider]  isosorbide mononitrate (IMDUR) 30 MG 24 hr tablet Take 0.5 tablets (15 mg total) by mouth daily. 05/16/18 12/01/18 Yes Weaver, Scott T, PA-C   loratadine (CLARITIN) 10 MG tablet Take 10 mg by mouth daily.   Yes [provider]  metFORMIN (GLUCOPHAGE) 500 MG tablet Take 1 tablet (500 mg total) by mouth 2 (two) times daily with a meal. Patient taking differently: Take 1,000 mg by mouth 2 (two) times daily with a meal.  04/21/17  Yes Isaiah Serge, NP  metoprolol succinate (TOPROL-XL) 50 MG 24 hr tablet TAKE ONE TABLET BY MOUTH DAILY IMMEDIATELY FOLLOWING A MEAL Patient taking differently: Take 50 mg by mouth daily.  01/26/18  Yes End, Harrell Gave, MD  montelukast (SINGULAIR) 10 MG tablet Take 10 mg by mouth at bedtime.   Yes [provider]  Multiple Vitamins-Minerals (MULTIVITAMIN WITH MINERALS) tablet Take 1 tablet by mouth daily.   Yes [provider]  nitroGLYCERIN (NITROSTAT) 0.4 MG SL tablet Place 0.4 mg under the tongue every 5 (five) minutes as needed for chest pain.   Yes [provider]  pantoprazole (PROTONIX) 40 MG tablet TAKE ONE TABLET BY MOUTH ONCE DAILY 08/01/18  Yes Richardson Dopp T, PA-C  Probiotic Product (PROBIOTIC DAILY PO) Take 1 capsule by mouth daily.   Yes [provider]  rosuvastatin (CRESTOR) 10 MG tablet Take 1 tablet (10 mg total) by mouth daily. 10/27/18 01/25/19 Yes Weaver, Scott T, PA-C  vedolizumab (ENTYVIO) 300 MG injection Inject 300 mg into the vein every 8 (eight) weeks. After the standard induction 02/21/18  Yes Nandigam, Venia Minks, MD  Vitamin D3 (VITAMIN D) 25 MCG tablet Take 1,000 Units by mouth at bedtime.   Yes [provider]  albuterol (PROVENTIL) (2.5 MG/3ML) 0.083% nebulizer solution Take 2.5 mg by nebulization every 4 (four) hours as needed.     [provider]    Current Facility-Administered Medications  Medication Dose Route Frequency Provider Last Rate Last Dose  . 0.9 %  sodium chloride infusion   Intravenous Continuous Nandigam, Kavitha V, MD      . lactated ringers infusion   Intravenous Continuous Mauri Pole, MD         Allergies as of 10/31/2018 - Review Complete 07/07/2018  Allergen Reaction Noted  . Lipitor [atorvastatin] Other (See Comments) 03/28/2013  . Remicade [infliximab]    . Ticagrelor  09/16/2017    Family History  Problem Relation Age of Onset  . Lung cancer Maternal Grandmother   . Stroke Father   . Hypertension Mother   . Heart attack Mother   . Heart disease Mother   . Hypertension Brother   . Heart disease Brother   . Hypertension Sister   . Heart disease Sister   . Coronary artery disease Brother   . Colon cancer Neg Hx   . Stomach cancer Neg Hx   . Esophageal cancer Neg Hx   . Rectal cancer Neg Hx     Social History   Socioeconomic  History  . Marital status: Married    Spouse name: Dane  . Number of children: 2  . Years of education: 12+  . Highest education level: Not on file  Occupational History  . Occupation: ACCOUNT REP.    Employer: [employer]  Social Needs  . Financial resource strain: Not on file  . Food insecurity    Worry: Not on file    Inability: Not on file  . Transportation needs    Medical: Not on file    Non-medical: Not on file  Tobacco Use  . Smoking status: Former Smoker    Packs/day: 1.00    Years: 35.00    Pack years: 35.00    Types: Cigarettes    Quit date: 05/31/2004    Years since quitting: 14.5  . Smokeless tobacco: Never Used  Substance and Sexual Activity  . Alcohol use: No  . Drug use: No  . Sexual activity: Not Currently    Comment: married  Lifestyle  . Physical activity    Days per week: Not on file    Minutes per session: Not on file  . Stress: Not on file  Relationships  . Social Herbalist on phone: Not on file    Gets together: Not on file    Attends religious service: Not on file    Active member of club or organization: Not on file    Attends meetings of clubs or organizations: Not on file    Relationship status: Not on file  . Intimate partner violence    Fear of current or  ex partner: Not on file    Emotionally abused: Not on file    Physically abused: Not on file    Forced sexual activity: Not on file  Other Topics Concern  . Not on file  Social History Narrative   Lives with her husband and their pets. Her children are adults and live independently.    Review of Systems:  All other review of systems negative except as mentioned in the HPI.  Physical Exam: Vital signs in last 24 hours: Temp:  [98.2 F (36.8 C)] 98.2 F (36.8 C) (07/07 0812) Pulse Rate:  [77] 77 (07/07 0812) Resp:  [20] 20 (07/07 0812) BP: (117)/(60) 117/60 (07/07 0812) SpO2:  [96 %] 96 % (07/07 0812) Weight:  [63 kg] 63 kg (07/07 0812)   General:   Alert,  Well-developed, well-nourished, pleasant and cooperative in NAD Lungs:  Clear throughout to auscultation.   Heart:  Regular rate and rhythm; no murmurs, clicks, rubs,  or gallops. Abdomen:  Soft, nontender and nondistended. Normal bowel sounds.   Neuro/Psych:  Alert and cooperative. Normal mood and affect. A and O x 3   K. Denzil Magnuson , MD [phone]

## 2018-12-05 NOTE — Anesthesia Procedure Notes (Signed)
Procedure Name: MAC Date/Time: 12/05/2018 9:10 AM Performed by: Lollie Sails, CRNA Pre-anesthesia Checklist: Patient identified, Emergency Drugs available, Suction available, Patient being monitored and Timeout performed Oxygen Delivery Method: Simple face mask

## 2018-12-05 NOTE — Op Note (Addendum)
[facility] Patient Name: [name] Procedure Date: 12/05/2018 MRN: [id number] Attending MD: [name] , MD Date of Birth: 1961-05-17 CSN: [id number] Age: 58 Admit Type: Inpatient Procedure:                Colonoscopy Indications:              Excision of colonic polyp. Flat polyp/nodular                            mucosa at IC valve removal with EMR. Providers:                [name] Pole, MD, [name] Daub, RN,                            [name], Technician Referring MD:              Medicines:                Monitored Anesthesia Care Complications:            No immediate complications. Estimated Blood Loss:     Estimated blood loss was minimal. Procedure:                Pre-Anesthesia Assessment:                           - Prior to the procedure, a History and Physical                            was performed, and patient medications and                            allergies were reviewed. The patient's tolerance of                            previous anesthesia was also reviewed. The risks                            and benefits of the procedure and the sedation                            options and risks were discussed with the patient.                            All questions were answered, and informed consent                            was obtained. Prior Anticoagulants: The patient has                            taken no previous anticoagulant or antiplatelet                            agents. ASA Grade Assessment: II - A patient with  mild systemic disease. After reviewing the risks                            and benefits, the patient was deemed in                            satisfactory condition to undergo the procedure.                           After obtaining informed consent, the colonoscope                            was passed under direct vision. Throughout the   procedure, the patient's blood pressure, pulse, and                            oxygen saturations were monitored continuously. The                            PCF-H190DL ([id number]) Olympus pediatric colonscope                            was introduced through the anus and advanced to the                            the terminal ileum, with identification of the                            appendiceal orifice and IC valve. The colonoscopy                            was performed without difficulty. The patient                            tolerated the procedure well. The quality of the                            bowel preparation was excellent. The terminal                            ileum, ileocecal valve, appendiceal orifice, and                            rectum were photographed. Scope In: 2:77:41 AM Scope Out: 10:01:00 AM Scope Withdrawal Time: 0 hours 39 minutes 32 seconds  Total Procedure Duration: 0 hours 44 minutes 56 seconds  Findings:      The perianal and digital rectal examinations were normal.      The terminal ileum contained a few sessile, non-bleeding polyps. The       polyps were 5 to 10 mm in diameter. Biopsies were taken with a cold       forceps for histology.      A 7 mm polyp was found in the ileocecal valve. The polyp was flat.       Preparations were made for mucosal resection. [name]  was injected to       raise the lesion. Snare mucosal resection was performed. Resection and       retrieval were complete. Biopsies were taken from the surrounding mucosa       at IC valve with a cold forceps for histology.      A 5 mm polyp was found in the transverse colon. The polyp was sessile.       The polyp was removed with a cold snare. Resection and retrieval were       complete.      Otherwise normal mucosa was found in the rest of the colon. Staining       chromoscopy with methylene blue was performed using irrigation       chromoendoscopy technique.      Non-bleeding  internal hemorrhoids were found during retroflexion. The       hemorrhoids were small. Impression:               - A few polyps in the terminal ileum. Biopsied.                           - One 7 mm polyp at the ileocecal valve, removed                            with mucosal resection. Resected and retrieved.                            Biopsied.                           - One 5 mm polyp in the transverse colon, removed                            with a cold snare. Resected and retrieved.                           - Normal mucosa in the entire examined colon.                            Chromoscopy performed.                           - Non-bleeding internal hemorrhoids.                           - Mucosal resection was performed. Resection and                            retrieval were complete. Moderate Sedation:      Not Applicable - Patient had care per Anesthesia. Recommendation:           - Soft diet for 3 days.                           - Continue present medications.                           - Await pathology results.                           -  No high dose aspirin, ibuprofen, naproxen, or                            other non-steroidal anti-inflammatory drugs for 2                            weeks.                           - Repeat colonoscopy in 1 year for surveillance                            based on pathology results.                           - Return to GI clinic at the next available                            appointment, virtual visit. Procedure Code(s):        --- Professional ---                           [phone], Colonoscopy, flexible; with endoscopic                            mucosal resection                           45385, 59, Colonoscopy, flexible; with removal of                            tumor(s), polyp(s), or other lesion(s) by snare                            technique                           45380, 3, Colonoscopy, flexible; with biopsy,                             single or multiple Diagnosis Code(s):        --- Professional ---                           D13.39, Benign neoplasm of other parts of small                            intestine                           K63.5, Polyp of colon                           K64.8, Other hemorrhoids CPT copyright 2019 American Medical Association. All rights reserved. The codes documented in this report are preliminary and upon coder review may  be revised to meet current compliance requirements. [name] Pole, MD 12/05/2018 10:14:04 AM This report  has been signed electronically. Number of Addenda: 0

## 2018-12-05 NOTE — Anesthesia Postprocedure Evaluation (Signed)
Anesthesia Post Note  Patient: BLAKLEE SHORES  Procedure(s) Performed: COLONOSCOPY WITH PROPOFOL (N/A ) SUBMUCOSAL LIFTING INJECTION POLYPECTOMY BIOPSY     Patient location during evaluation: Endoscopy Anesthesia Type: MAC Level of consciousness: awake and alert Pain management: pain level controlled Vital Signs Assessment: post-procedure vital signs reviewed and stable Respiratory status: spontaneous breathing, nonlabored ventilation, respiratory function stable and patient connected to nasal cannula oxygen Cardiovascular status: blood pressure returned to baseline and stable Postop Assessment: no apparent nausea or vomiting Anesthetic complications: no    Last Vitals:  Vitals:   12/05/18 1020 12/05/18 1025  BP: (!) 113/49 (!) 116/53  Pulse: 71 73  Resp: 20 20  Temp:  (!) 36.3 C  SpO2: 99% 97%    Last Pain:  Vitals:   12/05/18 1025  TempSrc: Temporal  PainSc: 0-No pain                 Mohamadou Maciver L Yitzchak Kothari

## 2018-12-05 NOTE — Anesthesia Preprocedure Evaluation (Addendum)
Anesthesia Evaluation  Patient identified by MRN, date of birth, ID band Patient awake    Reviewed: Allergy & Precautions, NPO status , Patient's Chart, lab work & pertinent test results, reviewed documented beta blocker date and time   Airway Mallampati: I  TM Distance: >3 FB Neck ROM: Full    Dental no notable dental hx. (+) Teeth Intact, Dental Advisory Given   Pulmonary sleep apnea , former smoker,    Pulmonary exam normal breath sounds clear to auscultation       Cardiovascular hypertension, Pt. on home beta blockers and Pt. on medications + CAD, + Past MI and + Cardiac Stents (2018)  Normal cardiovascular exam Rhythm:Regular Rate:Normal  Stress Test normal 2020  TTE 2018 EF 55-60%, G1DD, valves ok   Neuro/Psych PSYCHIATRIC DISORDERS Anxiety Depression negative neurological ROS     GI/Hepatic Neg liver ROS, GERD  Medicated,IBD, colonic polyp   Endo/Other  negative endocrine ROSdiabetes, Oral Hypoglycemic Agents  Renal/GU negative Renal ROS  negative genitourinary   Musculoskeletal  (+) Fibromyalgia -  Abdominal   Peds  Hematology negative hematology ROS (+)   Anesthesia Other Findings   Reproductive/Obstetrics                           Anesthesia Physical Anesthesia Plan  ASA: III  Anesthesia Plan: MAC   Post-op Pain Management:    Induction: Intravenous  PONV Risk Score and Plan: 2 and Propofol infusion and Treatment may vary due to age or medical condition  Airway Management Planned: Natural Airway and Simple Face Mask  Additional Equipment:   Intra-op Plan:   Post-operative Plan:   Informed Consent: I have reviewed the patients History and Physical, chart, labs and discussed the procedure including the risks, benefits and alternatives for the proposed anesthesia with the patient or authorized representative who has indicated his/her understanding and acceptance.      Dental advisory given  Plan Discussed with: CRNA  Anesthesia Plan Comments:         Anesthesia Quick Evaluation

## 2018-12-05 NOTE — Discharge Instructions (Signed)
[facility] ENDOSCOPY [address] [place], [place]  [address] Phone:  [phone]   December 05, 2018  Patient: [name]  Date of Birth: 12-19-1960  Date of Visit: December 05, 2018    To Whom It May Concern:  Brandalynn Ofallon was seen and treated on December 05, 2018  At [facility] and  she needs to be excused from work on July 7th and July 8th 2020  Please call [phone] or [facility] [phone] with any questions.  .           If you have any questions or concerns, please don't hesitate to call [phone].   Sincerely,       Treatment Team:  Attending Provider: Mauri Pole, MD       Colonoscopy, Adult, Care After This sheet gives you information about how to care for yourself after your procedure. Your health care provider may also give you more specific instructions. If you have problems or questions, contact your health care provider. What can I expect after the procedure? After the procedure, it is common to have:  A small amount of blood in your stool for 24 hours after the procedure.  Some gas.  Mild abdominal cramping or bloating. Follow these instructions at home: General instructions  For the first 24 hours after the procedure: ? Do not drive or use machinery. ? Do not sign important documents. ? Do not drink alcohol. ? Do your regular daily activities at a slower pace than normal. ? Eat soft, easy-to-digest foods.  Take over-the-counter or prescription medicines only as told by your health care provider. Relieving cramping and bloating   Try walking around when you have cramps or feel bloated.  Apply heat to your abdomen as told by your health care provider. Use a heat source that your health care provider recommends, such as a moist heat pack or a heating pad. ? Place a towel between your skin and the heat source. ? Leave the heat on for 20-30 minutes. ? Remove the heat if your skin turns bright red. This  is especially important if you are unable to feel pain, heat, or cold. You may have a greater risk of getting burned. Eating and drinking   Drink enough fluid to keep your urine pale yellow.  Resume your normal diet as instructed by your health care provider. Avoid heavy or fried foods that are hard to digest.  Avoid drinking alcohol for as long as instructed by your health care provider. Contact a health care provider if:  You have blood in your stool 2-3 days after the procedure. Get help right away if:  You have more than a small spotting of blood in your stool.  You pass large blood clots in your stool.  Your abdomen is swollen.  You have nausea or vomiting.  You have a fever.  You have increasing abdominal pain that is not relieved with medicine. Summary  After the procedure, it is common to have a small amount of blood in your stool. You may also have mild abdominal cramping and bloating.  For the first 24 hours after the procedure, do not drive or use machinery, sign important documents, or drink alcohol.  Contact your health care provider if you have a lot of blood in your stool, nausea or vomiting, a fever, or increased abdominal pain. This information is not intended to replace advice given to you by your health care provider.  Make sure you discuss any questions you have with your health care provider. Document Released: 12/30/2003 Document Revised: 03/09/2017 Document Reviewed: 07/29/2015 Elsevier Patient Education  2020 [employer].

## 2018-12-07 ENCOUNTER — Telehealth: Payer: Self-pay | Admitting: *Deleted

## 2018-12-07 ENCOUNTER — Encounter (HOSPITAL_COMMUNITY): Payer: Self-pay | Admitting: Gastroenterology

## 2018-12-07 NOTE — Telephone Encounter (Signed)
Left the pt a message to call the office back to go over and set-up, her upcoming virtual visit with Dr Meda Coffee for 7/14 at 1000.

## 2018-12-07 NOTE — Telephone Encounter (Signed)
Virtual Visit Pre-Appointment Phone Call  "(Name), I am calling you today to discuss your upcoming appointment. We are currently trying to limit exposure to the virus that causes COVID-19 by seeing patients at home rather than in the office."  1. "What is the BEST phone number to call the day of the visit?" - include this in appointment notes- YES UPDATED  2. Do you have or have access to (through a family member/friend) a smartphone with video capability that we can use for your visit?" a. If yes - list this number in appt notes as cell (if different from BEST phone #) and list the appointment type as a VIDEO visit in appointment notes-YES UPDATED  3. Confirm consent - "In the setting of the current Covid19 crisis, you are scheduled for a (phone or video) visit with Dr. Meda Coffee on 12/12/18 at 10 am.  Just as we do with many in-office visits, in order for you to participate in this visit, we must obtain consent.  If you'd like, I can send this to your mychart (if signed up) or email for you to review.  Otherwise, I can obtain your verbal consent now.  All virtual visits are billed to your insurance company just like a normal visit would be.  By agreeing to a virtual visit, we'd like you to understand that the technology does not allow for your provider to perform an examination, and thus may limit your provider's ability to fully assess your condition. If your provider identifies any concerns that need to be evaluated in person, we will make arrangements to do so.  Finally, though the technology is pretty good, we cannot assure that it will always work on either your or our end, and in the setting of a video visit, we may have to convert it to a phone-only visit.  In either situation, we cannot ensure that we have a secure connection.  Are you willing to proceed?" STAFF: Did the patient verbally acknowledge consent to telehealth visit? Document YES/NO here: YES PT GAVE VERBAL CONSENT TO TREAT OVER  THE PHONE  4. Advise patient to be prepared - "Two hours prior to your appointment, go ahead and check your blood pressure, pulse, oxygen saturation, and your weight (if you have the equipment to check those) and write them all down. When your visit starts, your provider will ask you for this information. If you have an Apple Watch or Kardia device, please plan to have heart rate information ready on the day of your appointment. Please have a pen and paper handy nearby the day of the visit as well."-YES PT WILL TAKE  5. Give patient instructions for Doxy.me -YES PT AWARE  6. Inform patient they will receive a phone call 15 minutes prior to their appointment time (may be from unknown caller ID) so they should be prepared to [place]    TELEPHONE CALL NOTE  [name] has been deemed a candidate for a follow-up tele-health visit to limit community exposure during the Covid-19 pandemic. I spoke with the patient via phone to ensure availability of phone/video source, confirm preferred email & phone number, and discuss instructions and expectations.  I reminded [name] to be prepared with any vital sign and/or heart rhythm information that could potentially be obtained via home monitoring, at the time of her visit. I reminded [name] to expect a phone call prior to her visit.  Nuala Alpha, LPN 02/02/1739 81:44 AM  IF USING  [email or web address].ME - The patient will receive a link just prior to their visit by text.--YES PT AWARE     FULL LENGTH CONSENT FOR TELE-HEALTH VISIT   I hereby voluntarily request, consent and authorize [facility] and its employed or contracted physicians, physician assistants, nurse practitioners or other licensed health care professionals (the Practitioner), to provide me with telemedicine health care services (the Services") as deemed necessary by the treating Practitioner. I acknowledge and consent to receive the Services by the Practitioner via  telemedicine. I understand that the telemedicine visit will involve communicating with the Practitioner through live audiovisual communication technology and the disclosure of certain medical information by electronic transmission. I acknowledge that I have been given the opportunity to request an in-person assessment or other available alternative prior to the telemedicine visit and am voluntarily participating in the telemedicine visit.  I understand that I have the right to withhold or withdraw my consent to the use of telemedicine in the course of my care at any time, without affecting my right to future care or treatment, and that the Practitioner or I may terminate the telemedicine visit at any time. I understand that I have the right to inspect all information obtained and/or recorded in the course of the telemedicine visit and may receive copies of available information for a reasonable fee.  I understand that some of the potential risks of receiving the Services via telemedicine include:   Delay or interruption in medical evaluation due to technological equipment failure or disruption;  Information transmitted may not be sufficient (e.g. poor resolution of images) to allow for appropriate medical decision making by the Practitioner; and/or   In rare instances, security protocols could fail, causing a breach of personal health information.  Furthermore, I acknowledge that it is my responsibility to provide information about my medical history, conditions and care that is complete and accurate to the best of my ability. I acknowledge that Practitioner's advice, recommendations, and/or decision may be based on factors not within their control, such as incomplete or inaccurate data provided by me or distortions of diagnostic images or specimens that may result from electronic transmissions. I understand that the practice of medicine is not an exact science and that Practitioner makes no warranties or  guarantees regarding treatment outcomes. I acknowledge that I will receive a copy of this consent concurrently upon execution via email to the email address I last provided but may also request a printed copy by calling the office of [place].    I understand that my insurance will be billed for this visit.   I have read or had this consent read to me.  I understand the contents of this consent, which adequately explains the benefits and risks of the Services being provided via telemedicine.   I have been provided ample opportunity to ask questions regarding this consent and the Services and have had my questions answered to my satisfaction.  I give my informed consent for the services to be provided through the use of telemedicine in my medical care  By participating in this telemedicine visit I agree to the above.--YES PT GAVE VERBAL CONSENT FOR DR NELSON TO TREAT HER OVER THE PHONE.

## 2018-12-12 ENCOUNTER — Other Ambulatory Visit: Payer: Self-pay

## 2018-12-12 ENCOUNTER — Telehealth (INDEPENDENT_AMBULATORY_CARE_PROVIDER_SITE_OTHER): Payer: BC Managed Care – PPO | Admitting: Cardiology

## 2018-12-12 ENCOUNTER — Encounter: Payer: Self-pay | Admitting: Cardiology

## 2018-12-12 VITALS — Wt 138.0 lb

## 2018-12-12 DIAGNOSIS — I10 Essential (primary) hypertension: Secondary | ICD-10-CM | POA: Diagnosis not present

## 2018-12-12 DIAGNOSIS — E782 Mixed hyperlipidemia: Secondary | ICD-10-CM

## 2018-12-12 DIAGNOSIS — R0609 Other forms of dyspnea: Secondary | ICD-10-CM | POA: Diagnosis not present

## 2018-12-12 DIAGNOSIS — I25119 Atherosclerotic heart disease of native coronary artery with unspecified angina pectoris: Secondary | ICD-10-CM | POA: Diagnosis not present

## 2018-12-12 DIAGNOSIS — R0600 Dyspnea, unspecified: Secondary | ICD-10-CM

## 2018-12-12 MED ORDER — METOPROLOL SUCCINATE ER 25 MG PO TB24: 25.0000 mg | ORAL_TABLET | Freq: Every day | ORAL | 2 refills | Status: DC

## 2018-12-12 NOTE — Addendum Note (Signed)
Addended by: Nuala Alpha on: 12/12/2018 11:03 AM   Modules accepted: Orders

## 2018-12-12 NOTE — Progress Notes (Signed)
Virtual Visit via Video Note   This visit type was conducted due to national recommendations for restrictions regarding the COVID-19 Pandemic (e.g. social distancing) in an effort to limit this patient's exposure and mitigate transmission in our community.  Due to her co-morbid illnesses, this patient is at least at moderate risk for complications without adequate follow up.  This format is felt to be most appropriate for this patient at this time.  All issues noted in this document were discussed and addressed.  A limited physical exam was performed with this format.  Please refer to the patient's chart for her consent to telehealth for [facility].   Date:  12/12/2018   ID:  [name], DOB [birth year], MRN [id number]  Patient Location: Home Provider Location: Home  PCP:  Leonides Sake, MD  Cardiologist:  Ena Dawley, MD  Electrophysiologist:  None   Evaluation Performed:  Follow-Up Visit  Chief Complaint:  hypotension  History of Present Illness:    [name] is a 58 y.o. female with h/o Crohn's disease, GERD, hypertension, diabetes, HL, palpitations, CAD and strong FHx of CAD. She suffered a NSTEMI in 11/18 treated with a DES to the proximal LAD.  She was last seen 05/16/18 for the evaluation of chest pain.  I placed her on Isosorbide and had her do a Nuclear stress test.  The stress test demonstrated no ischemia and was low risk.     06/14/2018 - [name] returns for follow up. She is here alone.  She overall feels better on the Isosorbide.  She still has some epigastric pain at times.  She does get short of breath at times.  She has not had paroxysmal nocturnal dyspnea, lower extremity swelling or syncope.  She is due for colonoscopy soon with gastroenterology.  12/11/2018 -this is 6 months follow-up, the patient denies any chest pain and has stable dyspnea on moderate exertion, she walks a lot at work.  She denies any palpitations dizziness or syncope.  No lower  extremity edema.  She has been compliant with her medications and has cut down metoprolol to 25 mg daily as she was otherwise hypotensive.  Prior CV studies:   The following studies were reviewed today:  Myoview 06/09/2018 EF 63, Ex for 9'/10.1 METs, normal study, Low Risk  Cardiac Catheterization 04/18/17 LM normal LAD prox 65 (FFR 0.82) RI normal LCx normal  RCA mild irregs EF 50-55, ant HK PCI:  3 x 12 Promus Premier DES to prox LAD          Echo 04/18/17 EF 55-60, mild ant-sept HK, Gr 1 DD, PASP 35  Myoview 03/29/16  Nuclear stress EF: 55%.  There was no ST segment deviation noted during stress.  The study is normal.  This is a low risk study.  The left ventricular ejection fraction is normal (55-65%).  Coronary CTA 11/12 Left Main: No plaque or stenosis Left Anterior Descending: Mild nonobstructive mixed plaque in the mid LAD. Left Circumflex: Moderate sized ramus with no plaque or stenosis. The CFX itself with a small vessel with no plaque or stenosis. Right Coronary Artery: Dominant vessel, no plaque or stenosis. Coronary Calcium Score: 7 Agatston units IMPRESSION: 1. No obstructive coronary disease. There was mild nonobstructive plaque in the mid LAD. 2. Coronary calcium score of 4 Agatston units places the patient in the [place] percentile for her age and gender. This is a high risk category for future cardiac events. I would suggest ASA 81 mg daily  and aggressive lowering of LDL cholesterol to ideally <70 but at least < 100.   The patient does not have symptoms concerning for COVID-19 infection (fever, chills, cough, or new shortness of breath).    Past Medical History:  Diagnosis Date  . Allergic rhinitis due to pollen   . Allergy   . Anxiety state, unspecified   . Arthritis   . Bronchitis    hx  . CAD (coronary artery disease)    s/p NSTEMI in 11/18 treated with a DES to the proximal LAD  . Calculus of gallbladder without mention of  cholecystitis or obstruction   . Crohn disease ([place])   . Depressive disorder, not elsewhere classified   . Diabetes mellitus without complication (HCC)    diet controlled  . Esophageal reflux   . Fibromyalgia   . Grade I diastolic dysfunction    Noted on ECHO  . Headache   . History of nuclear stress test    ETT-Myoview 10/17: EF 55%, normal perfusion; Low Risk // Nuclear stress test 05/2018:  EF 63, normal perfusion; Low Risk  . Myalgia and myositis, unspecified   . NSTEMI (non-ST elevated myocardial infarction) ([place]) 04/15/2017  . Other and unspecified hyperlipidemia   . Palpitations   . Pneumonia 13   hx  . S/P angioplasty with stent 04/18/17 with DES to pLAD 04/19/2017  . Seizures ([place])    1 seizure as a teenager   . Unspecified essential hypertension   . Vitamin B deficiency    Past Surgical History:  Procedure Laterality Date  . BIOPSY  12/05/2018   Procedure: BIOPSY;  Surgeon: Mauri Pole, MD;  Location: [facility];  Service: Endoscopy;;  . CHOLECYSTECTOMY N/A 09/19/2014   Procedure: LAPAROSCOPIC CHOLECYSTECTOMY WITH INTRAOPERATIVE CHOLANGIOGRAM;  Surgeon: Donnie Mesa, MD;  Location: [place];  Service: General;  Laterality: N/A;  . COLONOSCOPY WITH PROPOFOL N/A 12/05/2018   Procedure: COLONOSCOPY WITH PROPOFOL;  Surgeon: Mauri Pole, MD;  Location: [facility];  Service: Endoscopy;  Laterality: N/A;  chromoendoscopy  . CORONARY STENT INTERVENTION N/A 04/18/2017   Procedure: CORONARY STENT INTERVENTION;  Surgeon: Martinique, Peter M, MD;  Location: [place] CV [facility];  Service: Cardiovascular;  Laterality: N/A;  . ESOPHAGOGASTRODUODENOSCOPY    . EXPLORATORY LAPAROTOMY     for endometriosis  . INTRAVASCULAR PRESSURE WIRE/FFR STUDY N/A 04/18/2017   Procedure: INTRAVASCULAR PRESSURE WIRE/FFR STUDY;  Surgeon: Martinique, Peter M, MD;  Location: [place] CV [facility];  Service: Cardiovascular;  Laterality: N/A;  . LEFT HEART CATH AND CORONARY ANGIOGRAPHY N/A 04/18/2017    Procedure: LEFT HEART CATH AND CORONARY ANGIOGRAPHY;  Surgeon: Martinique, Peter M, MD;  Location: [place] CV [facility];  Service: Cardiovascular;  Laterality: N/A;  . POLYPECTOMY  12/05/2018   Procedure: POLYPECTOMY;  Surgeon: Mauri Pole, MD;  Location: [facility];  Service: Endoscopy;;  . SUBMUCOSAL LIFTING INJECTION  12/05/2018   Procedure: SUBMUCOSAL LIFTING INJECTION;  Surgeon: Mauri Pole, MD;  Location: [facility];  Service: Endoscopy;;  . TONSILLECTOMY AND ADENOIDECTOMY    . TUBAL LIGATION     BILATERAL     Current Meds  Medication Sig  . albuterol (PROVENTIL) (2.5 MG/3ML) 0.083% nebulizer solution Take 2.5 mg by nebulization every 4 (four) hours as needed.   Marland Kitchen aspirin 81 MG chewable tablet Chew 81 mg by mouth daily.    . budesonide (ENTOCORT EC) 3 MG 24 hr capsule Take 3 capsules (9 mg total) by mouth daily.  . busPIRone (BUSPAR) 10 MG tablet  Take 10 mg by mouth 2 (two) times daily.  . colestipol (COLESTID) 1 g tablet Take 1 g by mouth 2 (two) times daily.  . DULoxetine (CYMBALTA) 60 MG capsule Take 60 mg by mouth daily.  . enalapril (VASOTEC) 5 MG tablet Take 1 tablet (5 mg total) by mouth daily.  Marland Kitchen EPIPEN 2-PAK 0.3 MG/0.3ML SOAJ injection Inject 0.3 mg into the muscle as needed for anaphylaxis. Reported on 06/27/2015  . fenofibrate (TRICOR) 145 MG tablet TAKE ONE TABLET BY MOUTH ONCE DAILY  . glimepiride (AMARYL) 2 MG tablet Take 2 mg by mouth 2 (two) times a day.  . loratadine (CLARITIN) 10 MG tablet Take 10 mg by mouth daily.  . metFORMIN (GLUCOPHAGE) 500 MG tablet Take 1 tablet (500 mg total) by mouth 2 (two) times daily with a meal. (Patient taking differently: Take 1,000 mg by mouth 2 (two) times daily with a meal. )  . metoprolol succinate (TOPROL-XL) 50 MG 24 hr tablet TAKE ONE TABLET BY MOUTH DAILY IMMEDIATELY FOLLOWING A MEAL (Patient taking differently: Take 50 mg by mouth daily. )  . montelukast (SINGULAIR) 10 MG tablet Take 10 mg by mouth at bedtime.  .  Multiple Vitamins-Minerals (MULTIVITAMIN WITH MINERALS) tablet Take 1 tablet by mouth daily.  . nitroGLYCERIN (NITROSTAT) 0.4 MG SL tablet Place 0.4 mg under the tongue every 5 (five) minutes as needed for chest pain.  . pantoprazole (PROTONIX) 40 MG tablet TAKE ONE TABLET BY MOUTH ONCE DAILY  . Probiotic Product (PROBIOTIC DAILY PO) Take 1 capsule by mouth daily.  . rosuvastatin (CRESTOR) 10 MG tablet Take 1 tablet (10 mg total) by mouth daily.  . vedolizumab (ENTYVIO) 300 MG injection Inject 300 mg into the vein every 8 (eight) weeks. After the standard induction  . Vitamin D3 (VITAMIN D) 25 MCG tablet Take 1,000 Units by mouth at bedtime.   Current Facility-Administered Medications for the 12/12/18 encounter (Telemedicine) with Dorothy Spark, MD  Medication  . nitroGLYCERIN (NITROSTAT) SL tablet 0.3 mg     Allergies:   Lipitor [atorvastatin], Remicade [infliximab], and Ticagrelor   Social History   Tobacco Use  . Smoking status: Former Smoker    Packs/day: 1.00    Years: 35.00    Pack years: 35.00    Types: Cigarettes    Quit date: 05/31/2004    Years since quitting: 14.5  . Smokeless tobacco: Never Used  Substance Use Topics  . Alcohol use: No  . Drug use: No     Family Hx: The patient's family history includes Coronary artery disease in her brother; Heart attack in her mother; Heart disease in her brother, mother, and sister; Hypertension in her brother, mother, and sister; Lung cancer in her maternal grandmother; Stroke in her father. There is no history of Colon cancer, Stomach cancer, Esophageal cancer, or Rectal cancer.  ROS:   Please see the history of present illness.    All other systems reviewed and are negative.   Prior CV studies:   The following studies were reviewed today:  Labs/Other Tests and Data Reviewed:    EKG:  No ECG reviewed.  Recent Labs: 02/20/2018: BUN 19; Creatinine, Ser 0.69; Hemoglobin 14.2; Platelets 321.0; Potassium 4.9; Sodium 139  11/08/2018: ALT 41   Recent Lipid Panel Lab Results  Component Value Date/Time   CHOL 247 (H) 12/18/2015 10:03 AM   TRIG (H) 12/18/2015 10:03 AM    946.0 Triglyceride is over 400; calculations on Lipids are invalid.   HDL 34.10 (L) 12/18/2015  10:03 AM   CHOLHDL 7 12/18/2015 10:03 AM   LDLCALC 127 08/27/2013   LDLDIRECT 85.0 12/18/2015 10:03 AM    Wt Readings from Last 3 Encounters:  12/12/18 138 lb (62.6 kg)  12/05/18 139 lb (63 kg)  07/07/18 143 lb (64.9 kg)     Objective:    Vital Signs:  Wt 138 lb (62.6 kg)   LMP  (LMP Unknown)   BMI 25.24 kg/m    VITAL SIGNS:  reviewed  ASSESSMENT & PLAN:    Coronary artery disease involving native coronary artery of native heart with angina pectoris [facility]) History of NSTEMI November 2018 treated with a drug-eluting stent to the proximal LAD.  Recent nuclear stress test low risk.  She is currently stable with improved symptoms on aspirin, metoprolol, isosorbide, pravastatin.  She was slightly hypotensive with Toprol 50 mg daily will decrease to 25 mg daily.  Essential hypertension Today's blood pressure 127/64 mmHg, this is with decreased dose of Toprol will continue 25 mg daily.  Hyperlipidemia She is tolerating fenofibrate and Crestor well, we will repeat her lipids, her LFTs have improved on most recent labs in June 2020.  Sweating We will obtain TSH for excessive sweating that patient reports.  Follow-up in 6 months, we will arrange for CMP lipids and TSH now.  COVID-19 Education: The signs and symptoms of COVID-19 were discussed with the patient and how to seek care for testing (follow up with PCP or arrange E-visit).  The importance of social distancing was discussed today.  Time:   Today, I have spent 25 minutes with the patient with telehealth technology discussing the above problems.     Medication Adjustments/Labs and Tests Ordered: Current medicines are reviewed at length with the patient today.  Concerns regarding  medicines are outlined above.   Tests Ordered: No orders of the defined types were placed in this encounter.   Medication Changes: No orders of the defined types were placed in this encounter.   Follow Up:  Virtual Visit or In Person in 6 month(s)  Signed, Ena Dawley, MD  12/12/2018 10:39 AM    [place]

## 2018-12-12 NOTE — Patient Instructions (Signed)
Medication Instructions:   START TAKING YOUR TOPROL XL AS 25 MG BY MOUTH DAILY  If you need a refill on your cardiac medications before your next appointment, please call your pharmacy.    Lab work:  [facility] WERE CALLED AND GIVEN TO YOUR PCP OFFICE [facility] DR. HAMRICKS OFFICE FOR THEM TO CHECK A CMET, TSH, AND LIPIDS AND FAX THEM TO [place] AT [phone] ATTN: DR NELSON-SPOKE WITH THE NURSE THERE AT [phone] AND SHE WILL DRAW THESE FOR YOU AND FAX [place] THE RESULTS--MAKE SURE YOU GO FASTING TO YOUR LAB APPOINTMENT AS DISCUSSED.   If you have labs (blood work) drawn today and your tests are completely normal, you will receive your results only by: Marland Kitchen MyChart Message (if you have MyChart) OR . A paper copy in the mail If you have any lab test that is abnormal or we need to change your treatment, we will call you to review the results.    Follow-Up: At [facility], you and your health needs are our priority.  As part of our continuing mission to provide you with exceptional heart care, we have created designated Provider Care Teams.  These Care Teams include your primary Cardiologist (physician) and Advanced Practice Providers (APPs -  Physician Assistants and Nurse Practitioners) who all work together to provide you with the care you need, when you need it.  Your physician wants you to follow-up in: [place] will receive a reminder letter in the mail two months in advance. If you dn't receive a letter, please call our office to schedule the follow-up appointment.

## 2018-12-20 ENCOUNTER — Other Ambulatory Visit: Payer: Self-pay

## 2018-12-29 DIAGNOSIS — K50813 Crohn's disease of both small and large intestine with fistula: Secondary | ICD-10-CM | POA: Diagnosis not present

## 2018-12-29 DIAGNOSIS — Z79899 Other long term (current) drug therapy: Secondary | ICD-10-CM | POA: Diagnosis not present

## 2019-01-09 ENCOUNTER — Other Ambulatory Visit: Payer: Self-pay | Admitting: Gastroenterology

## 2019-01-10 ENCOUNTER — Telehealth: Payer: Self-pay

## 2019-01-10 NOTE — Telephone Encounter (Signed)
Opened in error

## 2019-01-19 ENCOUNTER — Telehealth: Payer: Self-pay | Admitting: Gastroenterology

## 2019-01-19 ENCOUNTER — Telehealth: Payer: Self-pay

## 2019-01-19 ENCOUNTER — Encounter: Payer: Self-pay | Admitting: Internal Medicine

## 2019-01-19 NOTE — Telephone Encounter (Signed)
Pt called regarding test results.

## 2019-01-19 NOTE — Telephone Encounter (Signed)
Advised the over read has been done on the pathology. No change in diagnosis. Not reviewed by her provider. Keep the appointment on 01/26/19

## 2019-01-23 NOTE — Telephone Encounter (Signed)
Opened in error

## 2019-01-25 ENCOUNTER — Telehealth: Payer: Self-pay | Admitting: Cardiology

## 2019-01-25 DIAGNOSIS — I214 Non-ST elevation (NSTEMI) myocardial infarction: Secondary | ICD-10-CM

## 2019-01-25 DIAGNOSIS — I10 Essential (primary) hypertension: Secondary | ICD-10-CM

## 2019-01-25 DIAGNOSIS — Z789 Other specified health status: Secondary | ICD-10-CM

## 2019-01-25 DIAGNOSIS — E785 Hyperlipidemia, unspecified: Secondary | ICD-10-CM

## 2019-01-25 DIAGNOSIS — E782 Mixed hyperlipidemia: Secondary | ICD-10-CM

## 2019-01-25 DIAGNOSIS — I2510 Atherosclerotic heart disease of native coronary artery without angina pectoris: Secondary | ICD-10-CM

## 2019-01-25 DIAGNOSIS — I25119 Atherosclerotic heart disease of native coronary artery with unspecified angina pectoris: Secondary | ICD-10-CM

## 2019-01-25 NOTE — Telephone Encounter (Signed)
°  Patient states since being on medication her legs hurt and feet are swollen   Pt c/o medication issue:  1. Name of Medication: rosuvastatin (CRESTOR) 10 MG tablet  2. How are you currently taking this medication (dosage and times per day)? AS WRITTEN  3. Are you having a reaction (difficulty breathing--STAT)? PAIN IN LEGS, FEET   4. What is your medication issue? PAIN IN LEGS

## 2019-01-25 NOTE — Telephone Encounter (Signed)
Dr. Meda Coffee, pt is complaining that her lefts are very sore and hurting, as well as her feet are swelling, with what she believes is coming from taking rosuvastatin.  Pt would like for you to advise on what she should do about this matter.  Please advise.

## 2019-01-25 NOTE — Telephone Encounter (Signed)
Spoke with the pt and informed her that per Dr Meda Coffee, we will discontinue her rosuvastatin and refer her to our lipid clinic to see our Pharmacist.  Informed the pt that I will place the referral in the system and send a message to our [facility] schedulers to call her back and arrange this appt.  Pt verbalized understanding and agrees with this plan.

## 2019-01-25 NOTE — Telephone Encounter (Signed)
Please discontinue and refer to the lipid clinic.

## 2019-01-26 ENCOUNTER — Encounter: Payer: Self-pay | Admitting: Gastroenterology

## 2019-01-26 ENCOUNTER — Ambulatory Visit: Payer: BC Managed Care – PPO | Admitting: Gastroenterology

## 2019-01-26 VITALS — Temp 98.1°F | Ht 63.0 in | Wt 143.6 lb

## 2019-01-26 DIAGNOSIS — K5080 Crohn's disease of both small and large intestine without complications: Secondary | ICD-10-CM | POA: Diagnosis not present

## 2019-01-26 DIAGNOSIS — E8881 Metabolic syndrome: Secondary | ICD-10-CM | POA: Diagnosis not present

## 2019-01-26 NOTE — Progress Notes (Signed)
[name]    [id number]    25-Mar-1961  Primary Care Physician:Hamrick, Lorin Mercy, MD  Referring Physician: Leonides Sake, [place],  [place] [address]   Chief complaint: IBD  HPI: 58 year old female with history of Crohn's disease for follow-up visit  Clinical remission on Entyvio maintenance therapy Overall doing well Denies any nausea, vomiting, abdominal pain, melena or bright red blood per rectum  Reviewed recent labs from PMD Hgb A1C 9 Glucose 216 Triglycerides 178  AST 36 ALT 43 Bilirubin 0.5 Alk phos 87  Colonoscopy December 05, 2018: Terminal ileal biopsies consistent with Crohn's disease.  IC valve polypoid lesion status post EMR, biopsies consistent with IBD negative for dysplasia or adenomatous tissue 5 mm polyp, tubular adenoma removed from transverse colon otherwise normal colon on chromoendoscopy  Colonoscopy July 07, 2018 Nodular TI biopsied Localized nodular polypoid mucosa in IC valve biopsied, ?  Adenoma on pathology report, requested additional review is pending.  She had ST elevation MI in November 2018, status post coronary stent placement and is currently on Plavix   MRI pelvis February 14, 2018 showed grade 1 simple linear intersphincteric perianal fistula without associated abscess. Mild segmental wall thickening and luminal narrowing in the distal ileum suggestive of mild active Crohn's ileitis. Mild chronic wall thickening in the terminal ileum without active inflammatory changes.  Relevant GI history: Crohn's disease with predominant small bowel involvement diagnosed in 1991.  Initially was managed with prednisone, had significant side effects.  Subsequently was in clinical remission on 6-MP.  6-MP was discontinued in 2017 due to persistently elevated transaminases, have improved after discontinuing 6-MP.  She was started on Humira, clinically failed with persistent symptoms.  Had undetectable drug trough  with elevated antibodies.  Started on budesonide 9 mg daily and taper down to 3 mg daily in 2018.  Developed rectal pain and noted to have perianal fistula on MRI pelvis September 2019. Outpatient Encounter Medications as of 01/26/2019  Medication Sig   albuterol (PROVENTIL) (2.5 MG/3ML) 0.083% nebulizer solution Take 2.5 mg by nebulization every 4 (four) hours as needed.    aspirin 81 MG chewable tablet Chew 81 mg by mouth daily.     budesonide (ENTOCORT EC) 3 MG 24 hr capsule Take 3 capsules (9 mg total) by mouth daily.   busPIRone (BUSPAR) 10 MG tablet Take 10 mg by mouth 2 (two) times daily.   colestipol (COLESTID) 1 g tablet TAKE ONE TABLET BY MOUTH TWICE DAILY   DULoxetine (CYMBALTA) 60 MG capsule Take 60 mg by mouth daily.   enalapril (VASOTEC) 5 MG tablet Take 1 tablet (5 mg total) by mouth daily.   EPIPEN 2-PAK 0.3 MG/0.3ML SOAJ injection Inject 0.3 mg into the muscle as needed for anaphylaxis. Reported on 06/27/2015   fenofibrate (TRICOR) 145 MG tablet TAKE ONE TABLET BY MOUTH ONCE DAILY   glimepiride (AMARYL) 2 MG tablet Take 2 mg by mouth 2 (two) times a day.   loratadine (CLARITIN) 10 MG tablet Take 10 mg by mouth daily.   metFORMIN (GLUCOPHAGE) 500 MG tablet Take 1 tablet (500 mg total) by mouth 2 (two) times daily with a meal. (Patient taking differently: Take 1,000 mg by mouth 2 (two) times daily with a meal. )   metoprolol succinate (TOPROL XL) 25 MG 24 hr tablet Take 1 tablet (25 mg total) by mouth daily.   montelukast (SINGULAIR) 10 MG tablet Take 10 mg by mouth at  bedtime.   Multiple Vitamins-Minerals (MULTIVITAMIN WITH MINERALS) tablet Take 1 tablet by mouth daily.   nitroGLYCERIN (NITROSTAT) 0.4 MG SL tablet Place 0.4 mg under the tongue every 5 (five) minutes as needed for chest pain.   pantoprazole (PROTONIX) 40 MG tablet TAKE ONE TABLET BY MOUTH ONCE DAILY   Probiotic Product (PROBIOTIC DAILY PO) Take 1 capsule by mouth daily.   vedolizumab (ENTYVIO) 300  MG injection Inject 300 mg into the vein every 8 (eight) weeks. After the standard induction   Vitamin D3 (VITAMIN D) 25 MCG tablet Take 1,000 Units by mouth at bedtime.   Facility-Administered Encounter Medications as of 01/26/2019  Medication   nitroGLYCERIN (NITROSTAT) SL tablet 0.3 mg    Allergies as of 01/26/2019 - Review Complete 01/26/2019  Allergen Reaction Noted   Lipitor [atorvastatin] Other (See Comments) 03/28/2013   Remicade [infliximab]     Ticagrelor  09/16/2017   Rosuvastatin Other (See Comments) 01/25/2019    Past Medical History:  Diagnosis Date   Allergic rhinitis due to pollen    Allergy    Anxiety state, unspecified    Arthritis    Bronchitis    hx   CAD (coronary artery disease)    s/p NSTEMI in 11/18 treated with a DES to the proximal LAD   Calculus of gallbladder without mention of cholecystitis or obstruction    Crohn disease ([place])    Depressive disorder, not elsewhere classified    Diabetes mellitus without complication ([place])    diet controlled   Esophageal reflux    Fibromyalgia    Grade I diastolic dysfunction    Noted on ECHO   Headache    History of nuclear stress test    ETT-Myoview 10/17: EF 55%, normal perfusion; Low Risk // Nuclear stress test 05/2018:  EF 63, normal perfusion; Low Risk   Myalgia and myositis, unspecified    NSTEMI (non-ST elevated myocardial infarction) ([place]) 04/15/2017   Other and unspecified hyperlipidemia    Palpitations    Pneumonia 15   hx   S/P angioplasty with stent 04/18/17 with DES to pLAD 04/19/2017   Seizures ([place])    1 seizure as a teenager    Unspecified essential hypertension    Vitamin B deficiency     Past Surgical History:  Procedure Laterality Date   BIOPSY  12/05/2018   Procedure: BIOPSY;  Surgeon: Mauri Pole, MD;  Location: [facility];  Service: Endoscopy;;   CHOLECYSTECTOMY N/A 09/19/2014   Procedure: LAPAROSCOPIC CHOLECYSTECTOMY WITH INTRAOPERATIVE  CHOLANGIOGRAM;  Surgeon: Donnie Mesa, MD;  Location: [facility];  Service: General;  Laterality: N/A;   COLONOSCOPY WITH PROPOFOL N/A 12/05/2018   Procedure: COLONOSCOPY WITH PROPOFOL;  Surgeon: Mauri Pole, MD;  Location: [facility];  Service: Endoscopy;  Laterality: N/A;  chromoendoscopy   CORONARY STENT INTERVENTION N/A 04/18/2017   Procedure: CORONARY STENT INTERVENTION;  Surgeon: Martinique, Peter M, MD;  Location: [facility];  Service: Cardiovascular;  Laterality: N/A;   ESOPHAGOGASTRODUODENOSCOPY     EXPLORATORY LAPAROTOMY     for endometriosis   INTRAVASCULAR PRESSURE WIRE/FFR STUDY N/A 04/18/2017   Procedure: INTRAVASCULAR PRESSURE WIRE/FFR STUDY;  Surgeon: Martinique, Peter M, MD;  Location: [facility];  Service: Cardiovascular;  Laterality: N/A;   LEFT HEART CATH AND CORONARY ANGIOGRAPHY N/A 04/18/2017   Procedure: LEFT HEART CATH AND CORONARY ANGIOGRAPHY;  Surgeon: Martinique, Peter M, MD;  Location: [facility];  Service: Cardiovascular;  Laterality: N/A;   POLYPECTOMY  12/05/2018  Procedure: POLYPECTOMY;  Surgeon: Mauri Pole, MD;  Location: [facility];  Service: Endoscopy;;   SUBMUCOSAL LIFTING INJECTION  12/05/2018   Procedure: SUBMUCOSAL LIFTING INJECTION;  Surgeon: Mauri Pole, MD;  Location: [facility];  Service: Endoscopy;;   TONSILLECTOMY AND ADENOIDECTOMY     TUBAL LIGATION     BILATERAL    Family History  Problem Relation Age of Onset   Lung cancer Maternal Grandmother    Stroke Father    Hypertension Mother    Heart attack Mother    Heart disease Mother    Hypertension Brother    Heart disease Brother    Hypertension Sister    Heart disease Sister    Coronary artery disease Brother    Colon cancer Neg Hx    Stomach cancer Neg Hx    Esophageal cancer Neg Hx    Rectal cancer Neg Hx     Social History   Socioeconomic History   Marital status: Married    Spouse name: Dane   Number of children: 2    Years of education: 12+   Highest education level: Not on file  Occupational History   Occupation: ACCOUNT REP.    Employer: [employer]  Social Needs   Financial resource strain: Not on file   Food insecurity    Worry: Not on file    Inability: Not on file   Transportation needs    Medical: Not on file    Non-medical: Not on file  Tobacco Use   Smoking status: Former Smoker    Packs/day: 1.00    Years: 35.00    Pack years: 35.00    Types: Cigarettes    Quit date: 05/31/2004    Years since quitting: 14.6   Smokeless tobacco: Never Used  Substance and Sexual Activity   Alcohol use: No   Drug use: No   Sexual activity: Not Currently    Comment: married  Lifestyle   Physical activity    Days per week: Not on file    Minutes per session: Not on file   Stress: Not on file  Relationships   Social connections    Talks on phone: Not on file    Gets together: Not on file    Attends religious service: Not on file    Active member of club or organization: Not on file    Attends meetings of clubs or organizations: Not on file    Relationship status: Not on file   Intimate partner violence    Fear of current or ex partner: Not on file    Emotionally abused: Not on file    Physically abused: Not on file    Forced sexual activity: Not on file  Other Topics Concern   Not on file  Social History Narrative   Lives with her husband and their pets. Her children are adults and live independently.      Review of systems: Review of Systems  Constitutional: Negative for fever and chills.  HENT: Positive for sinus problems Eyes: Negative for blurred vision.  Respiratory: Negative for cough, shortness of breath and wheezing.   Cardiovascular: Negative for chest pain and palpitations.  Gastrointestinal: as per HPI Genitourinary: Negative for dysuria, urgency, frequency and hematuria.  Musculoskeletal: Positive for myalgias, back pain and joint pain.   Skin: Negative for itching and rash.  Neurological: Negative for dizziness, tremors, focal weakness, seizures and loss of consciousness.  Endo/Heme/Allergies: Positive for seasonal allergies.  Psychiatric/Behavioral: Negative for depression,  suicidal ideas and hallucinations.  All other systems reviewed and are negative.   Physical Exam: Vitals:   01/26/19 1406  Temp: 98.1 F (36.7 C)   Body mass index is 25.44 kg/m. Gen:      No acute distress HEENT:  EOMI, sclera anicteric Neck:     No masses; no thyromegaly Lungs:    Clear to auscultation bilaterally; normal respiratory effort CV:         Regular rate and rhythm; no murmurs Abd:      + bowel sounds; soft, non-tender; no palpable masses, no distension Ext:    No edema; adequate peripheral perfusion Skin:      Warm and dry; no rash Neuro: alert and oriented x 3 Psych: normal mood and affect  Data Reviewed:  Reviewed labs, radiology imaging, old records and pertinent past GI work up   Assessment and Plan/Recommendations:  58 year old female with longstanding history of Crohn's disease, currently in clinical remission on Entyvio for follow-up visit  Metabolic syndrome with elevated triglycerides and hemoglobin A1c Advised patient to follow diet and exercise to improve blood sugar and triglyceride level  Continue Entyvio infusion  Due for recall colonoscopy July 2022  Return in 6 months or sooner if needed  25 minutes was spent face-to-face with the patient. Greater than 50% of the time used for counseling as well as treatment plan and follow-up. She had multiple questions which were answered to her satisfaction  K. Denzil Magnuson , MD    CC: Hamrick, Lorin Mercy, MD

## 2019-01-26 NOTE — Patient Instructions (Addendum)
Follow up in 6 months  I appreciate the  opportunity to care for you  Thank You   Harl Bowie , MD

## 2019-01-29 ENCOUNTER — Telehealth: Payer: Self-pay | Admitting: *Deleted

## 2019-01-29 DIAGNOSIS — I214 Non-ST elevation (NSTEMI) myocardial infarction: Secondary | ICD-10-CM

## 2019-01-29 DIAGNOSIS — Z789 Other specified health status: Secondary | ICD-10-CM

## 2019-01-29 DIAGNOSIS — E782 Mixed hyperlipidemia: Secondary | ICD-10-CM

## 2019-01-29 DIAGNOSIS — Z95820 Peripheral vascular angioplasty status with implants and grafts: Secondary | ICD-10-CM

## 2019-01-29 DIAGNOSIS — I25119 Atherosclerotic heart disease of native coronary artery with unspecified angina pectoris: Secondary | ICD-10-CM

## 2019-01-29 DIAGNOSIS — I2510 Atherosclerotic heart disease of native coronary artery without angina pectoris: Secondary | ICD-10-CM

## 2019-01-29 NOTE — Telephone Encounter (Signed)
Note   Spoke with the pt and informed her that per Dr Meda Coffee, we will discontinue her rosuvastatin and refer her to our lipid clinic to see our Pharmacist.  Informed the pt that I will place the referral in the system and send a message to our [facility] schedulers to call her back and arrange this appt.  Pt verbalized understanding and agrees with this plan.    Dorothy Spark, MD to Me     01/25/19 3:58 PM Note   Please discontinue and refer to the lipid clinic.     Referral to lipid clinic at [facility] was placed.  Marcelle Overlie Pharmacist in lipid clinic will be reaching out to the pt shortly, to arrange this consult appt.

## 2019-01-29 NOTE — Telephone Encounter (Signed)
Pt is scheduled for new pt consult in our Lipid Clinic to see our Pharmacist, on 9/18 at 1130.  Pt made aware of appt date and time by [facility] scheduling.

## 2019-01-30 ENCOUNTER — Encounter: Payer: Self-pay | Admitting: Gastroenterology

## 2019-01-30 ENCOUNTER — Telehealth: Payer: Self-pay | Admitting: Gastroenterology

## 2019-01-30 NOTE — Telephone Encounter (Signed)
Called the patient back. No answer. I left the information on her voicemail. She has no record of any vaccination for shingles.  Also left message that this office cannot give this vaccination because we do not stock it.

## 2019-02-01 DIAGNOSIS — L821 Other seborrheic keratosis: Secondary | ICD-10-CM | POA: Diagnosis not present

## 2019-02-01 DIAGNOSIS — L308 Other specified dermatitis: Secondary | ICD-10-CM | POA: Diagnosis not present

## 2019-02-01 DIAGNOSIS — L718 Other rosacea: Secondary | ICD-10-CM | POA: Diagnosis not present

## 2019-02-01 DIAGNOSIS — B079 Viral wart, unspecified: Secondary | ICD-10-CM | POA: Diagnosis not present

## 2019-02-01 DIAGNOSIS — L570 Actinic keratosis: Secondary | ICD-10-CM | POA: Diagnosis not present

## 2019-02-01 DIAGNOSIS — D225 Melanocytic nevi of trunk: Secondary | ICD-10-CM | POA: Diagnosis not present

## 2019-02-01 DIAGNOSIS — L820 Inflamed seborrheic keratosis: Secondary | ICD-10-CM | POA: Diagnosis not present

## 2019-02-01 DIAGNOSIS — C44722 Squamous cell carcinoma of skin of right lower limb, including hip: Secondary | ICD-10-CM | POA: Diagnosis not present

## 2019-02-13 DIAGNOSIS — L080 Pyoderma: Secondary | ICD-10-CM | POA: Diagnosis not present

## 2019-02-13 DIAGNOSIS — C44722 Squamous cell carcinoma of skin of right lower limb, including hip: Secondary | ICD-10-CM | POA: Diagnosis not present

## 2019-02-16 ENCOUNTER — Ambulatory Visit: Payer: BC Managed Care – PPO

## 2019-02-16 DIAGNOSIS — M791 Myalgia, unspecified site: Secondary | ICD-10-CM | POA: Insufficient documentation

## 2019-02-16 DIAGNOSIS — T466X5A Adverse effect of antihyperlipidemic and antiarteriosclerotic drugs, initial encounter: Secondary | ICD-10-CM | POA: Insufficient documentation

## 2019-02-16 NOTE — Progress Notes (Deleted)
Patient ID: [name]                 DOB: Oct 22, 1960                    MRN: [id number]     HPI: [name] is a 58 y.o. female patient referred to lipid clinic by Dr. Meda Coffee. PMH is significant for Crohn's disease, GERD,hypertension,diabetes,HL, palpitations, CAD and strong FHx of CAD. She suffered a NSTEMI in 11/18 treated with a DES to the proximal LAD.   In August patient reported leg pains which she believed was coming from the rosuvastatin. Patient was advised to stop the rosuvastatin and was referred to the lipid clinic.  Patient presents today   Last A1C 9, TG mildly elevated  Current Medications: fenofibrate 149m daily Intolerances: rosuvastatin 150mand 2056maily, atorvastatin 22m66md 40mg81mly, pravastatin 10.20,40,80mg 30my Risk Factors: ASCVD LDL goal: <70  Diet:   Exercise:   Family History: The patient's family history includes Coronary artery disease in her brother; Heart attack in her mother; Heart disease in her brother, mother, and sister; Hypertension in her brother, mother, and sister; Lung cancer in her maternal grandmother; Stroke in her father. There is no history of Colon cancer, Stomach cancer, Esophageal cancer, or Rectal cancer.  Social History: negative for tobacco, ETOH  Labs: 01/22/2019 TC 152, TG 178, HDL 48, LDL 68 (rosuvastatin 10mg d56m and fenofibrate 145 daily)  Past Medical History:  Diagnosis Date  . Allergic rhinitis due to pollen   . Allergy   . Anxiety state, unspecified   . Arthritis   . Bronchitis    hx  . CAD (coronary artery disease)    s/p NSTEMI in 11/18 treated with a DES to the proximal LAD  . Calculus of gallbladder without mention of cholecystitis or obstruction   . Crohn disease ([facility])   Norfolkepressive disorder, not elsewhere classified   . Diabetes mellitus without complication ([facility])    diet controlled  . Esophageal reflux   . Fibromyalgia   . Grade I diastolic dysfunction    Noted on ECHO  . Headache   .  History of nuclear stress test    ETT-Myoview 10/17: EF 55%, normal perfusion; Low Risk // Nuclear stress test 05/2018:  EF 63, normal perfusion; Low Risk  . Myalgia and myositis, unspecified   . NSTEMI (non-ST elevated myocardial infarction) ([facility]) [place]/2018  . Other and unspecified hyperlipidemia   . Palpitations   . Pneumonia 15   hx  . S/P angioplasty with stent 04/18/17 with DES to pLAD 04/19/2017  . Seizures ([facility])   Edenseizure as a teenager   . Unspecified essential hypertension   . Vitamin B deficiency     Current Outpatient Medications on File Prior to Visit  Medication Sig Dispense Refill  . albuterol (PROVENTIL) (2.5 MG/3ML) 0.083% nebulizer solution Take 2.5 mg by nebulization every 4 (four) hours as needed.     . aspirMarland Kitchenn 81 MG chewable tablet Chew 81 mg by mouth daily.      . budesonide (ENTOCORT EC) 3 MG 24 hr capsule Take 3 capsules (9 mg total) by mouth daily. 270 capsule 2  . busPIRone (BUSPAR) 10 MG tablet Take 10 mg by mouth 2 (two) times daily.    . colestipol (COLESTID) 1 g tablet TAKE ONE TABLET BY MOUTH TWICE DAILY 60 tablet 2  . DULoxetine (CYMBALTA) 60 MG capsule Take 60 mg by mouth daily.    .Marland Kitchen  enalapril (VASOTEC) 5 MG tablet Take 1 tablet (5 mg total) by mouth daily. 30 tablet 11  . EPIPEN 2-PAK 0.3 MG/0.3ML SOAJ injection Inject 0.3 mg into the muscle as needed for anaphylaxis. Reported on 06/27/2015  0  . fenofibrate (TRICOR) 145 MG tablet TAKE ONE TABLET BY MOUTH ONCE DAILY 90 tablet 3  . glimepiride (AMARYL) 2 MG tablet Take 2 mg by mouth 2 (two) times a day.    . loratadine (CLARITIN) 10 MG tablet Take 10 mg by mouth daily.    . metFORMIN (GLUCOPHAGE) 500 MG tablet Take 1 tablet (500 mg total) by mouth 2 (two) times daily with a meal. (Patient taking differently: Take 1,000 mg by mouth 2 (two) times daily with a meal. ) 180 tablet 3  . metoprolol succinate (TOPROL XL) 25 MG 24 hr tablet Take 1 tablet (25 mg total) by mouth daily. 90 tablet 2  . montelukast  (SINGULAIR) 10 MG tablet Take 10 mg by mouth at bedtime.    . Multiple Vitamins-Minerals (MULTIVITAMIN WITH MINERALS) tablet Take 1 tablet by mouth daily.    . nitroGLYCERIN (NITROSTAT) 0.4 MG SL tablet Place 0.4 mg under the tongue every 5 (five) minutes as needed for chest pain.    . pantoprazole (PROTONIX) 40 MG tablet TAKE ONE TABLET BY MOUTH ONCE DAILY 90 tablet 2  . Probiotic Product (PROBIOTIC DAILY PO) Take 1 capsule by mouth daily.    . vedolizumab (ENTYVIO) 300 MG injection Inject 300 mg into the vein every 8 (eight) weeks. After the standard induction 1 vial 6  . Vitamin D3 (VITAMIN D) 25 MCG tablet Take 1,000 Units by mouth at bedtime.     Current Facility-Administered Medications on File Prior to Visit  Medication Dose Route Frequency Provider Last Rate Last Dose  . nitroGLYCERIN (NITROSTAT) SL tablet 0.3 mg  0.3 mg Sublingual Q5 min PRN [name], MD        Allergies  Allergen Reactions  . Lipitor [Atorvastatin] Other (See Comments)    Elevated ALT  . Remicade [Infliximab]     REACTION: SOB  . Ticagrelor     Shortness of breath  . Rosuvastatin Other (See Comments)    Pt reports causes muscle aches and leg pains and feet to swell    Assessment/Plan:  1. Hyperlipidemia -    Thank you,  Ramond Dial, Pharm.D, [facility]  [address] [place], [place] [address]  Phone: [phone]; Fax: [phone]

## 2019-02-23 DIAGNOSIS — K50813 Crohn's disease of both small and large intestine with fistula: Secondary | ICD-10-CM | POA: Diagnosis not present

## 2019-03-12 DIAGNOSIS — H43812 Vitreous degeneration, left eye: Secondary | ICD-10-CM | POA: Diagnosis not present

## 2019-03-19 ENCOUNTER — Telehealth: Payer: Self-pay | Admitting: Gastroenterology

## 2019-03-19 NOTE — Telephone Encounter (Signed)
Pain left lower side this morning that had her "doubled over" and difficult to be comfortable. She had a hard bowel movement that felt painful in her gut. She has se=ince gone again twice, each time more loose. Denies blood. She feels like she is "having a fibromyalgia flare also." She does not know which is causing her symptoms. She hurts in entire body and hands. She presntly cannot get in to see her PCP until the end of the week. She asks your advice.

## 2019-03-19 NOTE — Telephone Encounter (Signed)
Call patient back.  She had severe pain during defecation in her mid and left lower abdomen, somewhat improved after she had a bowel movement.  She thinks she is getting backed up.  On further questioning she has generalized body aches and joint pain also has headache and mild fever.  She does not feel sick thinks it is her fibromyalgia. Advised her to do a bowel purge with MiraLAX and water. If her symptoms especially fever and myalgia worse, will need to test for Covid. If Covid negative, will bring her in for abdominal x-ray, CBC and BMP. Please call her tomorrow morning to check and see how she is doing.  Thank you

## 2019-03-20 NOTE — Telephone Encounter (Signed)
Ok, continue Miralax 1 capful daily. 8 -10 cups water daily. [facility] medical  Rheumatology, Dr Kathlene November. She can call them to get appointment and request PMD for referral if needed. Thanks

## 2019-03-20 NOTE — Telephone Encounter (Signed)
Calls back. Feeling better, not worse. Less pain today. She has started having bowel movements after 3 doses of Miralax. She is looking for a rheumatologist to follow her on her fibromyalgia. Do you know of any that treat fibromyalgia?

## 2019-03-20 NOTE — Telephone Encounter (Signed)
Information shared with the patient.

## 2019-03-20 NOTE — Telephone Encounter (Signed)
No answer. Left a message to call back with an update on her symptoms.

## 2019-03-22 DIAGNOSIS — J209 Acute bronchitis, unspecified: Secondary | ICD-10-CM | POA: Diagnosis not present

## 2019-03-22 DIAGNOSIS — J324 Chronic pansinusitis: Secondary | ICD-10-CM | POA: Diagnosis not present

## 2019-03-26 NOTE — Telephone Encounter (Signed)
Pt stated that she is returning your call.

## 2019-03-26 NOTE — Telephone Encounter (Signed)
Patient states her Miralax dosing has been successful.

## 2019-03-29 DIAGNOSIS — H43812 Vitreous degeneration, left eye: Secondary | ICD-10-CM | POA: Diagnosis not present

## 2019-04-02 DIAGNOSIS — B078 Other viral warts: Secondary | ICD-10-CM | POA: Diagnosis not present

## 2019-04-02 DIAGNOSIS — D0462 Carcinoma in situ of skin of left upper limb, including shoulder: Secondary | ICD-10-CM | POA: Diagnosis not present

## 2019-04-02 DIAGNOSIS — Z85828 Personal history of other malignant neoplasm of skin: Secondary | ICD-10-CM | POA: Diagnosis not present

## 2019-04-02 DIAGNOSIS — C44622 Squamous cell carcinoma of skin of right upper limb, including shoulder: Secondary | ICD-10-CM | POA: Diagnosis not present

## 2019-04-18 DIAGNOSIS — Z01419 Encounter for gynecological examination (general) (routine) without abnormal findings: Secondary | ICD-10-CM | POA: Diagnosis not present

## 2019-04-18 DIAGNOSIS — Z1231 Encounter for screening mammogram for malignant neoplasm of breast: Secondary | ICD-10-CM | POA: Diagnosis not present

## 2019-04-18 DIAGNOSIS — Z124 Encounter for screening for malignant neoplasm of cervix: Secondary | ICD-10-CM | POA: Diagnosis not present

## 2019-04-18 DIAGNOSIS — Z6825 Body mass index (BMI) 25.0-25.9, adult: Secondary | ICD-10-CM | POA: Diagnosis not present

## 2019-04-23 ENCOUNTER — Other Ambulatory Visit: Payer: Self-pay | Admitting: Physician Assistant

## 2019-04-23 ENCOUNTER — Other Ambulatory Visit: Payer: Self-pay

## 2019-04-23 ENCOUNTER — Encounter (HOSPITAL_COMMUNITY): Payer: Self-pay

## 2019-04-23 ENCOUNTER — Emergency Department (HOSPITAL_COMMUNITY)
Admission: EM | Admit: 2019-04-23 | Discharge: 2019-04-24 | Disposition: A | Discharge status: Home / Self Care | Payer: BC Managed Care – PPO | Attending: Emergency Medicine | Admitting: Emergency Medicine

## 2019-04-23 ENCOUNTER — Emergency Department (HOSPITAL_COMMUNITY): Payer: BC Managed Care – PPO

## 2019-04-23 DIAGNOSIS — Z5321 Procedure and treatment not carried out due to patient leaving prior to being seen by health care provider: Secondary | ICD-10-CM | POA: Diagnosis not present

## 2019-04-23 DIAGNOSIS — R0602 Shortness of breath: Secondary | ICD-10-CM | POA: Diagnosis not present

## 2019-04-23 DIAGNOSIS — M25512 Pain in left shoulder: Secondary | ICD-10-CM | POA: Insufficient documentation

## 2019-04-23 DIAGNOSIS — R079 Chest pain, unspecified: Secondary | ICD-10-CM | POA: Diagnosis not present

## 2019-04-23 DIAGNOSIS — M549 Dorsalgia, unspecified: Secondary | ICD-10-CM | POA: Diagnosis present

## 2019-04-23 LAB — BASIC METABOLIC PANEL
Anion gap: 13 (ref 5–15)
BUN: 17 mg/dL (ref 6–20)
CO2: 25 mmol/L (ref 22–32)
Calcium: 9.5 mg/dL (ref 8.9–10.3)
Chloride: 100 mmol/L (ref 98–111)
Creatinine, Ser: 0.8 mg/dL (ref 0.44–1.00)
GFR calc Af Amer: >60 mL/min (ref >60)
GFR calc non Af Amer: >60 mL/min (ref >60)
Glucose, Bld: 347 mg/dL — ABNORMAL HIGH (ref 70–99)
Potassium: 4 mmol/L (ref 3.5–5.1)
Sodium: 138 mmol/L (ref 135–145)

## 2019-04-23 LAB — CBC
HCT: 43.7 % (ref 36.0–46.0)
Hemoglobin: 13.7 g/dL (ref 12.0–15.0)
MCH: 29.6 pg (ref 26.0–34.0)
MCHC: 31.4 g/dL (ref 30.0–36.0)
MCV: 94.4 fL (ref 80.0–100.0)
Platelets: 300 10*3/uL (ref 150–400)
RBC: 4.63 MIL/uL (ref 3.87–5.11)
RDW: 13.8 % (ref 11.5–15.5)
WBC: 8.6 10*3/uL (ref 4.0–10.5)
nRBC: 0 % (ref 0.0–0.2)

## 2019-04-23 LAB — TROPONIN I (HIGH SENSITIVITY): Troponin I (High Sensitivity): 4 ng/L (ref <18)

## 2019-04-23 LAB — I-STAT BETA HCG BLOOD, ED (NOT ORDERABLE): I-stat hCG, quantitative: <5 m[IU]/mL (ref <5)

## 2019-04-23 MED ORDER — SODIUM CHLORIDE 0.9% FLUSH: 3.0000 mL | Freq: Once | INTRAVENOUS | Status: DC

## 2019-04-23 NOTE — ED Triage Notes (Signed)
Patient arrived stating about four hours ago she began having left sided back and left shoulder pain that radiates to her chest. Patient has a stent from previous heart attack a year ago. Denies any NVD or dizziness. Does report mild shortness of breath.

## 2019-04-24 ENCOUNTER — Telehealth: Payer: Self-pay | Admitting: Cardiology

## 2019-04-24 NOTE — Telephone Encounter (Signed)
New Message  Pt wants blood work reviewed from where she was last in ER by cardiologist.   Please call to discuss

## 2019-04-24 NOTE — Telephone Encounter (Signed)
Pt went to [facility] ER yesterday and had labs and an EKG done.  She would like for Dr. Meda Coffee to look at both her labs and EKG in Epic, upon return to the office, and get her opinion on results noted.  Pt is aware that her BS was elevated at 347, and she is being placed on sliding scale insulin, and working with a Provider, to get this more under control. Informed the pt that I will route this request to Dr. Meda Coffee to further review and advise on as needed, and follow-up with her accordingly thereafter, if further recommendations are made.  Pt verbalized understanding and agrees with this plan.

## 2019-04-27 DIAGNOSIS — E119 Type 2 diabetes mellitus without complications: Secondary | ICD-10-CM | POA: Diagnosis not present

## 2019-04-30 DIAGNOSIS — K50813 Crohn's disease of both small and large intestine with fistula: Secondary | ICD-10-CM | POA: Diagnosis not present

## 2019-05-04 DIAGNOSIS — H43812 Vitreous degeneration, left eye: Secondary | ICD-10-CM | POA: Diagnosis not present

## 2019-05-14 DIAGNOSIS — R208 Other disturbances of skin sensation: Secondary | ICD-10-CM | POA: Diagnosis not present

## 2019-05-14 DIAGNOSIS — L820 Inflamed seborrheic keratosis: Secondary | ICD-10-CM | POA: Diagnosis not present

## 2019-05-14 DIAGNOSIS — Z85828 Personal history of other malignant neoplasm of skin: Secondary | ICD-10-CM | POA: Diagnosis not present

## 2019-05-14 DIAGNOSIS — L570 Actinic keratosis: Secondary | ICD-10-CM | POA: Diagnosis not present

## 2019-05-14 DIAGNOSIS — L718 Other rosacea: Secondary | ICD-10-CM | POA: Diagnosis not present

## 2019-05-21 ENCOUNTER — Other Ambulatory Visit: Payer: Self-pay | Admitting: Physician Assistant

## 2019-05-21 ENCOUNTER — Other Ambulatory Visit: Payer: Self-pay | Admitting: Gastroenterology

## 2019-05-21 DIAGNOSIS — K50819 Crohn's disease of both small and large intestine with unspecified complications: Secondary | ICD-10-CM

## 2019-05-23 ENCOUNTER — Other Ambulatory Visit: Payer: Self-pay | Admitting: Physician Assistant

## 2019-05-29 DIAGNOSIS — J069 Acute upper respiratory infection, unspecified: Secondary | ICD-10-CM | POA: Diagnosis not present

## 2019-06-05 DIAGNOSIS — J069 Acute upper respiratory infection, unspecified: Secondary | ICD-10-CM | POA: Diagnosis not present

## 2019-06-11 ENCOUNTER — Telehealth: Payer: Self-pay | Admitting: Gastroenterology

## 2019-06-11 NOTE — Telephone Encounter (Signed)
If it is formed hard stool unlikely to be C. difficile infection.  We can check for C. difficile if she is having watery diarrhea otherwise do not recommend. Please advise her to start taking MiraLAX 1 capful daily until she has a bowel movement and use it after that as needed.  Increase water intake and Benefiber 1 tablespoon 3 times daily with meals.  Schedule follow-up office visit next available appointment.  Thank you

## 2019-06-11 NOTE — Telephone Encounter (Signed)
Discussed with the patient. Agrees to this plan. Appointment scheduled for 06/28/19 at 9:30 am.

## 2019-06-11 NOTE — Telephone Encounter (Signed)
Patient calls with complaints of abdominal pain. Frequent stools that are hard formed. She has felt like she was constipated. She has not been taking Miralax or any other stool softeners. She had not needed them was her statement.  Please advise.

## 2019-06-21 ENCOUNTER — Telehealth: Payer: Self-pay

## 2019-06-21 ENCOUNTER — Other Ambulatory Visit: Payer: Self-pay

## 2019-06-21 DIAGNOSIS — E119 Type 2 diabetes mellitus without complications: Secondary | ICD-10-CM

## 2019-06-21 NOTE — Telephone Encounter (Signed)
Pt has given verbal consent for phone visit. Pt has verified medications, allergies, and pharmacy. Pt will have vitals ready prior to visit.   YOUR CARDIOLOGY TEAM HAS ARRANGED FOR AN E-VISIT FOR YOUR APPOINTMENT - PLEASE REVIEW IMPORTANT INFORMATION BELOW SEVERAL DAYS PRIOR TO YOUR APPOINTMENT  Due to the recent COVID-19 pandemic, we are transitioning in-person office visits to tele-medicine visits in an effort to decrease unnecessary exposure to our patients, their families, and staff. These visits are billed to your insurance just like a normal visit is. We also encourage you to sign up for MyChart if you have not already done so. You will need a smartphone if possible. For patients that do not have this, we can still complete the visit using a regular telephone but do prefer a smartphone to enable video when possible. You may have a family member that lives with you that can help. If possible, we also ask that you have a blood pressure cuff and scale at home to measure your blood pressure, heart rate and weight prior to your scheduled appointment. Patients with clinical needs that need an in-person evaluation and testing will still be able to come to the office if absolutely necessary. If you have any questions, feel free to call our office.     YOUR PROVIDER WILL BE USING THE FOLLOWING PLATFORM TO COMPLETE YOUR VISIT: Phone Call . IF USING MYCHART - How to Download the MyChart App to Your SmartPhone   - If Apple, go to [employer] and type in MyChart in the search bar and download the app. If Android, ask patient to go to [employer] and type in [place] in the search bar and download the app. The app is free but as with any other app downloads, your phone may require you to verify saved payment information or Apple/Android password.  - You will need to then log into the app with your MyChart username and password, and select [facility] as your healthcare provider to link the account.  - When  it is time for your visit, go to the MyChart app, find appointments, and click Begin Video Visit. Be sure to Select Allow for your device to access the Microphone and Camera for your visit. You will then be connected, and your provider will be with you shortly.  **If you have any issues connecting or need assistance, please contact MyChart service desk [phone])**  **If using a computer, in order to ensure the best quality for your visit, you will need to use either of the following Internet Browsers: Insurance underwriter or [employer]**  . IF USING DOXIMITY or DOXY.ME - The staff will give you instructions on receiving your link to join the meeting the day of your visit.      2-3 DAYS BEFORE YOUR APPOINTMENT  You will receive a telephone call from one of our [place] team members - your caller ID may say "Unknown caller." If this is a video visit, we will walk you through how to get the video launched on your phone. We will remind you check your blood pressure, heart rate and weight prior to your scheduled appointment. If you have an Apple Watch or Kardia, please upload any pertinent ECG strips the day before or morning of your appointment to [place]. Our staff will also make sure you have reviewed the consent and agree to move forward with your scheduled tele-health visit.     THE DAY OF YOUR APPOINTMENT  Approximately 15 minutes  prior to your scheduled appointment, you will receive a telephone call from one of [place] team - your caller ID may say "Unknown caller."  Our staff will confirm medications, vital signs for the day and any symptoms you may be experiencing. Please have this information available prior to the time of visit start. It may also be helpful for you to have a pad of paper and pen handy for any instructions given during your visit. They will also walk you through joining the smartphone meeting if this is a video visit.    CONSENT FOR TELE-HEALTH VISIT -  PLEASE REVIEW  I hereby voluntarily request, consent and authorize [facility] and its employed or contracted physicians, physician assistants, nurse practitioners or other licensed health care professionals (the Practitioner), to provide me with telemedicine health care services (the "Services") as deemed necessary by the treating Practitioner. I acknowledge and consent to receive the Services by the Practitioner via telemedicine. I understand that the telemedicine visit will involve communicating with the Practitioner through live audiovisual communication technology and the disclosure of certain medical information by electronic transmission. I acknowledge that I have been given the opportunity to request an in-person assessment or other available alternative prior to the telemedicine visit and am voluntarily participating in the telemedicine visit.  I understand that I have the right to withhold or withdraw my consent to the use of telemedicine in the course of my care at any time, without affecting my right to future care or treatment, and that the Practitioner or I may terminate the telemedicine visit at any time. I understand that I have the right to inspect all information obtained and/or recorded in the course of the telemedicine visit and may receive copies of available information for a reasonable fee.  I understand that some of the potential risks of receiving the Services via telemedicine include:  Marland Kitchen Delay or interruption in medical evaluation due to technological equipment failure or disruption; . Information transmitted may not be sufficient (e.g. poor resolution of images) to allow for appropriate medical decision making by the Practitioner; and/or  . In rare instances, security protocols could fail, causing a breach of personal health information.  Furthermore, I acknowledge that it is my responsibility to provide information about my medical history, conditions and care that is complete  and accurate to the best of my ability. I acknowledge that Practitioner's advice, recommendations, and/or decision may be based on factors not within their control, such as incomplete or inaccurate data provided by me or distortions of diagnostic images or specimens that may result from electronic transmissions. I understand that the practice of medicine is not an exact science and that Practitioner makes no warranties or guarantees regarding treatment outcomes. I acknowledge that I will receive a copy of this consent concurrently upon execution via email to the email address I last provided but may also request a printed copy by calling the office of [place].    I understand that my insurance will be billed for this visit.   I have read or had this consent read to me. . I understand the contents of this consent, which adequately explains the benefits and risks of the Services being provided via telemedicine.  . I have been provided ample opportunity to ask questions regarding this consent and the Services and have had my questions answered to my satisfaction. . I give my informed consent for the services to be provided through the use of telemedicine in my medical care  By  participating in this telemedicine visit I agree to the above.

## 2019-06-22 ENCOUNTER — Encounter: Payer: Self-pay | Admitting: *Deleted

## 2019-06-22 ENCOUNTER — Other Ambulatory Visit: Payer: Self-pay

## 2019-06-22 ENCOUNTER — Telehealth (INDEPENDENT_AMBULATORY_CARE_PROVIDER_SITE_OTHER): Payer: Self-pay | Admitting: Cardiology

## 2019-06-22 VITALS — BP 98/66 | HR 81 | Wt 140.0 lb

## 2019-06-22 DIAGNOSIS — I25119 Atherosclerotic heart disease of native coronary artery with unspecified angina pectoris: Secondary | ICD-10-CM

## 2019-06-22 DIAGNOSIS — E785 Hyperlipidemia, unspecified: Secondary | ICD-10-CM

## 2019-06-22 DIAGNOSIS — I10 Essential (primary) hypertension: Secondary | ICD-10-CM

## 2019-06-22 DIAGNOSIS — Z95820 Peripheral vascular angioplasty status with implants and grafts: Secondary | ICD-10-CM

## 2019-06-22 DIAGNOSIS — E782 Mixed hyperlipidemia: Secondary | ICD-10-CM

## 2019-06-22 DIAGNOSIS — I2510 Atherosclerotic heart disease of native coronary artery without angina pectoris: Secondary | ICD-10-CM

## 2019-06-22 DIAGNOSIS — R072 Precordial pain: Secondary | ICD-10-CM

## 2019-06-22 NOTE — Progress Notes (Signed)
Virtual Visit via Video Note   This visit type was conducted due to national recommendations for restrictions regarding the COVID-19 Pandemic (e.g. social distancing) in an effort to limit this patient's exposure and mitigate transmission in our community.  Due to her co-morbid illnesses, this patient is at least at moderate risk for complications without adequate follow up.  This format is felt to be most appropriate for this patient at this time.  All issues noted in this document were discussed and addressed.  A limited physical exam was performed with this format.  Please refer to the patient's chart for her consent to telehealth for [name].   Date:  06/22/2019   ID:  [name], DOB 05/03/1961, MRN [id number]  Patient Location: Home Provider Location: Home  PCP:  [facility]  Cardiologist:  Ena Dawley, MD  Electrophysiologist:  None   Evaluation Performed:  Follow-Up Visit  Chief Complaint: Chest pain  History of Present Illness:    [name] is a 59 y.o. female with h/o Crohn's disease, GERD, hypertension, diabetes, HL, palpitations, CAD and strong FHx of CAD. She suffered a NSTEMI in 11/18 treated with a DES to the proximal LAD.  She was last seen 05/16/18 for the evaluation of chest pain.  I placed her on Isosorbide and had her do a Nuclear stress test.  The stress test demonstrated no ischemia and was low risk.     06/22/2019 -this is 6 months follow-up, the patient has been noticing some atypical chest pain, also some fatigue with exertion, she has been compliant with her medications and tolerates them well.  She went to the ER prior to Thanksgiving with similar symptoms and had normal EKG, normal labs including troponin, kidney function, electrolytes, but significantly elevated blood sugar.  Since then she has been started on insulin.  Prior CV studies:   The following studies were reviewed today:  Myoview 06/09/2018 EF 63, Ex for 9'/10.1 METs, normal  study, Low Risk  Cardiac Catheterization 04/18/17 LM normal LAD prox 65 (FFR 0.82) RI normal LCx normal  RCA mild irregs EF 50-55, ant HK PCI:  3 x 12 Promus Premier DES to prox LAD          Echo 04/18/17 EF 55-60, mild ant-sept HK, Gr 1 DD, PASP 35  Myoview 03/29/16  Nuclear stress EF: 55%.  There was no ST segment deviation noted during stress.  The study is normal.  This is a low risk study.  The left ventricular ejection fraction is normal (55-65%).  Coronary CTA 11/12 Left Main: No plaque or stenosis Left Anterior Descending: Mild nonobstructive mixed plaque in the mid LAD. Left Circumflex: Moderate sized ramus with no plaque or stenosis. The CFX itself with a small vessel with no plaque or stenosis. Right Coronary Artery: Dominant vessel, no plaque or stenosis. Coronary Calcium Score: 7 Agatston units IMPRESSION: 1. No obstructive coronary disease. There was mild nonobstructive plaque in the mid LAD. 2. Coronary calcium score of 4 Agatston units places the patient in the [place] percentile for her age and gender. This is a high risk category for future cardiac events. I would suggest ASA 81 mg daily and aggressive lowering of LDL cholesterol to ideally <70 but at least < 100.  The patient does not have symptoms concerning for COVID-19 infection (fever, chills, cough, or new shortness of breath).   Past Medical History:  Diagnosis Date  . Allergic rhinitis due to pollen   . Allergy   .  Anxiety state, unspecified   . Arthritis   . Bronchitis    hx  . CAD (coronary artery disease)    s/p NSTEMI in 11/18 treated with a DES to the proximal LAD  . Calculus of gallbladder without mention of cholecystitis or obstruction   . Crohn disease ([place])   . Depressive disorder, not elsewhere classified   . Diabetes mellitus without complication (HCC)    diet controlled  . Esophageal reflux   . Fibromyalgia   . Grade I diastolic dysfunction    Noted on ECHO  .  Headache   . History of nuclear stress test    ETT-Myoview 10/17: EF 55%, normal perfusion; Low Risk // Nuclear stress test 05/2018:  EF 63, normal perfusion; Low Risk  . Myalgia and myositis, unspecified   . NSTEMI (non-ST elevated myocardial infarction) ([place]) 04/15/2017  . Other and unspecified hyperlipidemia   . Palpitations   . Pneumonia 13   hx  . S/P angioplasty with stent 04/18/17 with DES to pLAD 04/19/2017  . Seizures ([place])    1 seizure as a teenager   . Unspecified essential hypertension   . Vitamin B deficiency    Past Surgical History:  Procedure Laterality Date  . BIOPSY  12/05/2018   Procedure: BIOPSY;  Surgeon: Mauri Pole, MD;  Location: [facility];  Service: Endoscopy;;  . CHOLECYSTECTOMY N/A 09/19/2014   Procedure: LAPAROSCOPIC CHOLECYSTECTOMY WITH INTRAOPERATIVE CHOLANGIOGRAM;  Surgeon: Donnie Mesa, MD;  Location: [facility];  Service: General;  Laterality: N/A;  . COLONOSCOPY WITH PROPOFOL N/A 12/05/2018   Procedure: COLONOSCOPY WITH PROPOFOL;  Surgeon: Mauri Pole, MD;  Location: [facility];  Service: Endoscopy;  Laterality: N/A;  chromoendoscopy  . CORONARY STENT INTERVENTION N/A 04/18/2017   Procedure: CORONARY STENT INTERVENTION;  Surgeon: Martinique, Peter M, MD;  Location: [facility];  Service: Cardiovascular;  Laterality: N/A;  . ESOPHAGOGASTRODUODENOSCOPY    . EXPLORATORY LAPAROTOMY     for endometriosis  . INTRAVASCULAR PRESSURE WIRE/FFR STUDY N/A 04/18/2017   Procedure: INTRAVASCULAR PRESSURE WIRE/FFR STUDY;  Surgeon: Martinique, Peter M, MD;  Location: [facility];  Service: Cardiovascular;  Laterality: N/A;  . LEFT HEART CATH AND CORONARY ANGIOGRAPHY N/A 04/18/2017   Procedure: LEFT HEART CATH AND CORONARY ANGIOGRAPHY;  Surgeon: Martinique, Peter M, MD;  Location: [facility];  Service: Cardiovascular;  Laterality: N/A;  . POLYPECTOMY  12/05/2018   Procedure: POLYPECTOMY;  Surgeon: Mauri Pole, MD;  Location: [facility];   Service: Endoscopy;;  . SUBMUCOSAL LIFTING INJECTION  12/05/2018   Procedure: SUBMUCOSAL LIFTING INJECTION;  Surgeon: Mauri Pole, MD;  Location: [facility];  Service: Endoscopy;;  . TONSILLECTOMY AND ADENOIDECTOMY    . TUBAL LIGATION     BILATERAL    No outpatient medications have been marked as taking for the 06/22/19 encounter (Telemedicine) with Dorothy Spark, MD.   Current Facility-Administered Medications for the 06/22/19 encounter (Telemedicine) with Dorothy Spark, MD  Medication  . nitroGLYCERIN (NITROSTAT) SL tablet 0.3 mg    Allergies:   Lipitor [atorvastatin], Remicade [infliximab], Ticagrelor, and Rosuvastatin   Social History   Tobacco Use  . Smoking status: Former Smoker    Packs/day: 1.00    Years: 35.00    Pack years: 35.00    Types: Cigarettes    Quit date: 05/31/2004    Years since quitting: 15.0  . Smokeless tobacco: Never Used  Substance Use Topics  . Alcohol use: No  . Drug use: No  Family Hx: The patient's family history includes Coronary artery disease in her brother; Heart attack in her mother; Heart disease in her brother, mother, and sister; Hypertension in her brother, mother, and sister; Lung cancer in her maternal grandmother; Stroke in her father. There is no history of Colon cancer, Stomach cancer, Esophageal cancer, or Rectal cancer.  ROS:   Please see the history of present illness.    All other systems reviewed and are negative.  Prior CV studies:   The following studies were reviewed today:  Labs/Other Tests and Data Reviewed:    EKG:  No ECG reviewed.  Recent Labs: 11/08/2018: ALT 41 04/23/2019: BUN 17; Creatinine, Ser 0.80; Hemoglobin 13.7; Platelets 300; Potassium 4.0; Sodium 138   Recent Lipid Panel Lab Results  Component Value Date/Time   CHOL 247 (H) 12/18/2015 10:03 AM   TRIG (H) 12/18/2015 10:03 AM    946.0 Triglyceride is over 400; calculations on Lipids are invalid.   HDL 34.10 (L) 12/18/2015 10:03 AM    CHOLHDL 7 12/18/2015 10:03 AM   LDLCALC 127 08/27/2013 12:00 AM   LDLDIRECT 85.0 12/18/2015 10:03 AM    Wt Readings from Last 3 Encounters:  06/22/19 140 lb (63.5 kg)  01/26/19 143 lb 9.6 oz (65.1 kg)  12/12/18 138 lb (62.6 kg)    Objective:    Vital Signs:  BP 98/66   Pulse 81   Wt 140 lb (63.5 kg)   LMP  (LMP Unknown)   BMI 24.80 kg/m    VITAL SIGNS:  reviewed   ASSESSMENT & PLAN:    Coronary artery disease involving native coronary artery of native heart with angina pectoris ([place]) History of NSTEMI November 2018 treated with a drug-eluting stent to the proximal LAD.  Recent nuclear stress test low risk.  She is having symptoms similar to prior to her non-STEMI, will obtain Lexiscan nuclear stress test to evaluate for ischemia.  Essential hypertension Her blood pressure rather low today however she has no symptoms of dizziness, she states her blood pressure is usually around 117 mmHg.  Hyperlipidemia She is tolerating fenofibrate and Crestor well, we will obtain her labs at her next in person visit.  Follow-up in 6 months, we will obtain for CMP, TSH and lipids prior to the next visit.  COVID-19 Education: The signs and symptoms of COVID-19 were discussed with the patient and how to seek care for testing (follow up with PCP or arrange E-visit).  The importance of social distancing was discussed today.  Time:   Today, I have spent 25 minutes with the patient with telehealth technology discussing the above problems.     Medication Adjustments/Labs and Tests Ordered: Current medicines are reviewed at length with the patient today.  Concerns regarding medicines are outlined above.   Tests Ordered: No orders of the defined types were placed in this encounter.   Medication Changes: No orders of the defined types were placed in this encounter.   Follow Up:  Virtual Visit or In Person in 6 month(s)  Signed, Ena Dawley, MD  06/22/2019 11:54 AM    [facility]

## 2019-06-22 NOTE — Patient Instructions (Signed)
Medication Instructions:   Your physician recommends that you continue on your current medications as directed. Please refer to the Current Medication list given to you today.   *If you need a refill on your cardiac medications before your next appointment, please call your pharmacy*    Testing/Procedures:  Your physician has requested that you have a lexiscan myoview. For further information please visit [email or web address]. Please follow instruction sheet, as given. OUR SCHEDULING DEPARTMENT WILL CALL YOU IN THE NEAR FUTURE TO HAVE THIS TEST SCHEDULED.  YOUR INSTRUCTIONS FOR THIS TEST WILL BE SENT IN YOUR MYCHART TO FOLLOW.    Follow-Up: At [facility], you and your health needs are our priority.  As part of our continuing mission to provide you with exceptional heart care, we have created designated Provider Care Teams.  These Care Teams include your primary Cardiologist (physician) and Advanced Practice Providers (APPs -  Physician Assistants and Nurse Practitioners) who all work together to provide you with the care you need, when you need it.  Your next appointment:   6 month(s)  The format for your next appointment:   In [facility] AN EKG  Provider:   Ena Dawley, MD

## 2019-06-27 DIAGNOSIS — K50813 Crohn's disease of both small and large intestine with fistula: Secondary | ICD-10-CM | POA: Diagnosis not present

## 2019-06-27 DIAGNOSIS — Z79899 Other long term (current) drug therapy: Secondary | ICD-10-CM | POA: Diagnosis not present

## 2019-06-28 ENCOUNTER — Ambulatory Visit (INDEPENDENT_AMBULATORY_CARE_PROVIDER_SITE_OTHER): Payer: Self-pay | Admitting: Gastroenterology

## 2019-06-28 ENCOUNTER — Encounter: Payer: Self-pay | Admitting: Gastroenterology

## 2019-06-28 ENCOUNTER — Other Ambulatory Visit (INDEPENDENT_AMBULATORY_CARE_PROVIDER_SITE_OTHER): Payer: BC Managed Care – PPO

## 2019-06-28 ENCOUNTER — Other Ambulatory Visit: Payer: Self-pay

## 2019-06-28 VITALS — BP 110/78 | HR 60 | Temp 97.2°F | Ht 63.0 in | Wt 136.5 lb

## 2019-06-28 DIAGNOSIS — K5904 Chronic idiopathic constipation: Secondary | ICD-10-CM | POA: Diagnosis not present

## 2019-06-28 DIAGNOSIS — K9089 Other intestinal malabsorption: Secondary | ICD-10-CM

## 2019-06-28 DIAGNOSIS — K219 Gastro-esophageal reflux disease without esophagitis: Secondary | ICD-10-CM

## 2019-06-28 DIAGNOSIS — K5080 Crohn's disease of both small and large intestine without complications: Secondary | ICD-10-CM

## 2019-06-28 DIAGNOSIS — R232 Flushing: Secondary | ICD-10-CM

## 2019-06-28 DIAGNOSIS — K760 Fatty (change of) liver, not elsewhere classified: Secondary | ICD-10-CM

## 2019-06-28 DIAGNOSIS — K582 Mixed irritable bowel syndrome: Secondary | ICD-10-CM

## 2019-06-28 LAB — COMPREHENSIVE METABOLIC PANEL
ALT: 45 U/L — ABNORMAL HIGH (ref 0–35)
AST: 36 U/L (ref 0–37)
Albumin: 4 g/dL (ref 3.5–5.2)
Alkaline Phosphatase: 96 U/L (ref 39–117)
BUN: 20 mg/dL (ref 6–23)
CO2: 28 mEq/L (ref 19–32)
Calcium: 9.3 mg/dL (ref 8.4–10.5)
Chloride: 99 mEq/L (ref 96–112)
Creatinine, Ser: 0.7 mg/dL (ref 0.40–1.20)
GFR: 85.06 mL/min (ref >60.00)
Glucose, Bld: 332 mg/dL — ABNORMAL HIGH (ref 70–99)
Potassium: 4.3 mEq/L (ref 3.5–5.1)
Sodium: 137 mEq/L (ref 135–145)
Total Bilirubin: 0.4 mg/dL (ref 0.2–1.2)
Total Protein: 6.2 g/dL (ref 6.0–8.3)

## 2019-06-28 NOTE — Patient Instructions (Signed)
Go to the basement for labs today  Continue Miralax 1 capful daily  Follow up in 6 months  If you are age 61 or older, your body mass index should be between 23-30. Your Body mass index is 24.18 kg/m. If this is out of the aforementioned range listed, please consider follow up with your Primary Care Provider.  If you are age 57 or younger, your body mass index should be between 19-25. Your Body mass index is 24.18 kg/m. If this is out of the aformentioned range listed, please consider follow up with your Primary Care Provider.    I appreciate the  opportunity to care for you  Thank You   Harl Bowie , MD

## 2019-06-28 NOTE — Progress Notes (Signed)
[name]    [id number]    [birth year]  [facility], [facility]  Referring Physician: [facility], [facility] [address] [place],  [place] [address]   Chief complaint: Crohn's disease, constipation HPI:  59 year old female with history of Crohn's disease on Entyvio for maintenance therapy here for follow-up visit for evaluation of recent onset constipation and abdominal pain  She is doing better since she started taking MiraLAX 1 capful daily, is having 1-2 soft bowel movements daily.  No longer has lower abdominal pain  She continues to have hot flashes and increased sweating.  Recently started on insulin Her blood sugars remain high Intentional weight loss with dietary changes  Denies any nausea, vomiting, melena or blood per rectum.  She had NSTEMI in November 2018, status post coronary stent placement (DES)  Colonoscopy December 05, 2018: Terminal ileal biopsies consistent with Crohn's disease.  IC valve polypoid lesion status post EMR, biopsies consistent with IBD negative for dysplasia or adenomatous tissue 5 mm polyp, tubular adenoma removed from transverse colon otherwise normal colon on chromoendoscopy  Colonoscopy July 07, 2018 Nodular TI biopsied Localized nodular polypoid mucosa in IC valve biopsied, ?  Adenoma on pathology report, requested additional review is pending.   MRI pelvis February 14, 2018 showed grade 1 simple linear intersphincteric perianal fistula without associated abscess. Mild segmental wall thickening and luminal narrowing in the distal ileum suggestive of mild active Crohn's ileitis. Mild chronic wall thickening in the terminal ileum without active inflammatory changes.  Relevant GI history: Crohn's disease with predominant small bowel involvement diagnosed in 1991. Initially was managed with prednisone, had significant side effects. Subsequently was in clinical remission on 6-MP. 6-MP was discontinued in 2017  due to persistently elevated transaminases, have improved after discontinuing 6-MP. She was started on Humira, clinically failed with persistent symptoms. Had undetectable drug trough with elevated antibodies. Started on budesonide 9 mg daily and taper down to 3 mg daily in 2018. Developed rectal pain and noted to have perianal fistula on MRI pelvis September 2019.  Outpatient Encounter Medications as of 06/28/2019  Medication Sig  . albuterol (PROVENTIL) (2.5 MG/3ML) 0.083% nebulizer solution Take 2.5 mg by nebulization every 4 (four) hours as needed.   Marland Kitchen aspirin 81 MG chewable tablet Chew 81 mg by mouth daily.    . budesonide (ENTOCORT EC) 3 MG 24 hr capsule TAKE 3 CAPSULES BY MOUTH ONCE DAILY  . busPIRone (BUSPAR) 10 MG tablet Take 10 mg by mouth 2 (two) times daily.  . colestipol (COLESTID) 1 g tablet TAKE ONE TABLET BY MOUTH TWICE DAILY  . DULoxetine (CYMBALTA) 60 MG capsule Take 60 mg by mouth daily.  . enalapril (VASOTEC) 5 MG tablet Take 1 tablet (5 mg total) by mouth daily.  Marland Kitchen EPIPEN 2-PAK 0.3 MG/0.3ML SOAJ injection Inject 0.3 mg into the muscle as needed for anaphylaxis. Reported on 06/27/2015  . fenofibrate (TRICOR) 145 MG tablet TAKE ONE TABLET BY MOUTH ONCE DAILY  . glimepiride (AMARYL) 2 MG tablet Take 2 mg by mouth 2 (two) times a day.  . loratadine (CLARITIN) 10 MG tablet Take 10 mg by mouth daily.  . metFORMIN (GLUCOPHAGE) 500 MG tablet Take 1,000 mg by mouth 2 (two) times daily with a meal.  . metoprolol succinate (TOPROL XL) 25 MG 24 hr tablet Take 1 tablet (25 mg total) by mouth daily.  . montelukast (SINGULAIR) 10 MG tablet Take 10 mg by mouth at bedtime.  Marland Kitchen  Multiple Vitamins-Minerals (MULTIVITAMIN WITH MINERALS) tablet Take 1 tablet by mouth daily.  . nitroGLYCERIN (NITROSTAT) 0.4 MG SL tablet Place 0.4 mg under the tongue every 5 (five) minutes as needed for chest pain.  . pantoprazole (PROTONIX) 40 MG tablet TAKE 1 TABLET BY MOUTH ONCE DAILY  . Probiotic Product  (PROBIOTIC DAILY PO) Take 1 capsule by mouth daily.  . vedolizumab (ENTYVIO) 300 MG injection Inject 300 mg into the vein every 8 (eight) weeks. After the standard induction  . Vitamin D3 (VITAMIN D) 25 MCG tablet Take 1,000 Units by mouth at bedtime.   Facility-Administered Encounter Medications as of 06/28/2019  Medication  . nitroGLYCERIN (NITROSTAT) SL tablet 0.3 mg    Allergies as of 06/28/2019 - Review Complete 06/22/2019  Allergen Reaction Noted  . Lipitor [atorvastatin] Other (See Comments) 03/28/2013  . Remicade [infliximab]    . Ticagrelor  09/16/2017  . Rosuvastatin Other (See Comments) 01/25/2019    Past Medical History:  Diagnosis Date  . Allergic rhinitis due to pollen   . Allergy   . Anxiety state, unspecified   . Arthritis   . Bronchitis    hx  . CAD (coronary artery disease)    s/p NSTEMI in 11/18 treated with a DES to the proximal LAD  . Calculus of gallbladder without mention of cholecystitis or obstruction   . Crohn disease ([place])   . Depressive disorder, not elsewhere classified   . Diabetes mellitus without complication (HCC)    diet controlled  . Esophageal reflux   . Fibromyalgia   . Grade I diastolic dysfunction    Noted on ECHO  . Headache   . History of nuclear stress test    ETT-Myoview 10/17: EF 55%, normal perfusion; Low Risk // Nuclear stress test 05/2018:  EF 63, normal perfusion; Low Risk  . Myalgia and myositis, unspecified   . NSTEMI (non-ST elevated myocardial infarction) ([place]) 04/15/2017  . Other and unspecified hyperlipidemia   . Palpitations   . Pneumonia 13   hx  . S/P angioplasty with stent 04/18/17 with DES to pLAD 04/19/2017  . Seizures ([place])    1 seizure as a teenager   . Unspecified essential hypertension   . Vitamin B deficiency     Past Surgical History:  Procedure Laterality Date  . BIOPSY  12/05/2018   Procedure: BIOPSY;  Surgeon: Mauri Pole, MD;  Location: [facility];  Service: Endoscopy;;  .  CHOLECYSTECTOMY N/A 09/19/2014   Procedure: LAPAROSCOPIC CHOLECYSTECTOMY WITH INTRAOPERATIVE CHOLANGIOGRAM;  Surgeon: Donnie Mesa, MD;  Location: [facility];  Service: General;  Laterality: N/A;  . COLONOSCOPY WITH PROPOFOL N/A 12/05/2018   Procedure: COLONOSCOPY WITH PROPOFOL;  Surgeon: Mauri Pole, MD;  Location: [facility];  Service: Endoscopy;  Laterality: N/A;  chromoendoscopy  . CORONARY STENT INTERVENTION N/A 04/18/2017   Procedure: CORONARY STENT INTERVENTION;  Surgeon: Martinique, Peter M, MD;  Location: [facility];  Service: Cardiovascular;  Laterality: N/A;  . ESOPHAGOGASTRODUODENOSCOPY    . EXPLORATORY LAPAROTOMY     for endometriosis  . INTRAVASCULAR PRESSURE WIRE/FFR STUDY N/A 04/18/2017   Procedure: INTRAVASCULAR PRESSURE WIRE/FFR STUDY;  Surgeon: Martinique, Peter M, MD;  Location: [facility];  Service: Cardiovascular;  Laterality: N/A;  . LEFT HEART CATH AND CORONARY ANGIOGRAPHY N/A 04/18/2017   Procedure: LEFT HEART CATH AND CORONARY ANGIOGRAPHY;  Surgeon: Martinique, Peter M, MD;  Location: [facility];  Service: Cardiovascular;  Laterality: N/A;  . POLYPECTOMY  12/05/2018   Procedure: POLYPECTOMY;  Surgeon: Mauri Pole, MD;  Location: [facility];  Service: Endoscopy;;  . SUBMUCOSAL LIFTING INJECTION  12/05/2018   Procedure: SUBMUCOSAL LIFTING INJECTION;  Surgeon: Mauri Pole, MD;  Location: [facility];  Service: Endoscopy;;  . TONSILLECTOMY AND ADENOIDECTOMY    . TUBAL LIGATION     BILATERAL    Family History  Problem Relation Age of Onset  . Lung cancer Maternal Grandmother   . Stroke Father   . Hypertension Mother   . Heart attack Mother   . Heart disease Mother   . Hypertension Brother   . Heart disease Brother   . Hypertension Sister   . Heart disease Sister   . Coronary artery disease Brother   . Colon cancer Neg Hx   . Stomach cancer Neg Hx   . Esophageal cancer Neg Hx   . Rectal cancer Neg Hx     Social History    Socioeconomic History  . Marital status: Married    Spouse name: Dane  . Number of children: 2  . Years of education: 12+  . Highest education level: Not on file  Occupational History  . Occupation: ACCOUNT REP.    Employer: [employer]  Tobacco Use  . Smoking status: Former Smoker    Packs/day: 1.00    Years: 35.00    Pack years: 35.00    Types: Cigarettes    Quit date: 05/31/2004    Years since quitting: 15.0  . Smokeless tobacco: Never Used  Substance and Sexual Activity  . Alcohol use: No  . Drug use: No  . Sexual activity: Not Currently    Comment: married  Other Topics Concern  . Not on file  Social History Narrative   Lives with her husband and their pets. Her children are adults and live independently.   Social Determinants of Health   Financial Resource Strain:   . Difficulty of Paying Living Expenses: Not on file  Food Insecurity:   . Worried About Charity fundraiser in the Last Year: Not on file  . Ran Out of Food in the Last Year: Not on file  Transportation Needs:   . Lack of Transportation (Medical): Not on file  . Lack of Transportation (Non-Medical): Not on file  Physical Activity:   . Days of Exercise per Week: Not on file  . Minutes of Exercise per Session: Not on file  Stress:   . Feeling of Stress : Not on file  Social Connections:   . Frequency of Communication with Friends and Family: Not on file  . Frequency of Social Gatherings with Friends and Family: Not on file  . Attends Religious Services: Not on file  . Active Member of Clubs or Organizations: Not on file  . Attends Archivist Meetings: Not on file  . Marital Status: Not on file  Intimate Partner Violence:   . Fear of Current or Ex-Partner: Not on file  . Emotionally Abused: Not on file  . Physically Abused: Not on file  . Sexually Abused: Not on file      Review of systems: Review of Systems  Constitutional: Negative for fever and chills.  HENT:  Negative.   Eyes: Negative for blurred vision.  Respiratory: Negative for cough, shortness of breath and wheezing.   Cardiovascular: Negative for chest pain and palpitations.  Gastrointestinal: as per HPI Genitourinary: Negative for dysuria, urgency, frequency and hematuria.  Musculoskeletal: Negative for myalgias, back pain and joint pain.  Skin: Negative for itching  and rash.  Neurological: Negative for dizziness, tremors, focal weakness, seizures and loss of consciousness.  Endo/Heme/Allergies: Positive for seasonal allergies.  Psychiatric/Behavioral: Negative for depression, suicidal ideas and hallucinations.  All other systems reviewed and are negative.   Physical Exam: Vitals:   06/28/19 0925  BP: 110/78  Pulse: 60  Temp: (!) 97.2 F (36.2 C)   Body mass index is 24.18 kg/m. Gen:      No acute distress HEENT:  EOMI, sclera anicteric Neck:     No masses; no thyromegaly Lungs:    Clear to auscultation bilaterally; normal respiratory effort CV:         Regular rate and rhythm; no murmurs Abd:      + bowel sounds; soft, non-tender; no palpable masses, no distension Ext:    No edema; adequate peripheral perfusion Skin:      Warm and dry; no rash Neuro: alert and oriented x 3 Psych: normal mood and affect  Data Reviewed:  Reviewed labs, radiology imaging, old records and pertinent past GI work up   Assessment and Plan/Recommendations:  59 year old female with history of hypertension, CAD, NSTEMI, type 2 diabetes, fatty liver, Crohn's disease  Crohn's disease: In clinical remission on Entyvio Continue current dose of 8 weeks  Fatty liver: Secondary to metabolic syndrome with diabetes and hyperlipidemia Continue with dietary changes and exercise Follow-up CMP  Hot flashes could be secondary to frequent fluctuation of blood sugar with hyper and hypoglycemia She is postmenopausal but her symptoms been ongoing for past 15 years Advised patient to discuss with PCP  regarding continuous glucose monitoring and adjustment of medication as needed  Bile salt induced diarrhea: Continue Colestid 1 g twice daily  IBS with alternating constipation and diarrhea: Continue MiraLAX 1 capful daily as needed  GERD: Continue Protonix and antireflux measures  Due for recall colonoscopy July 2022 for IBD surveillance and history of adenomatous polyps  Return in 6 months or sooner  This visit required 32 minutes of patient care (this includes precharting, chart review, review of results, face-to-face time used for counseling as well as treatment plan and follow-up. The patient was provided an opportunity to ask questions and all were answered. The patient agreed with the plan and demonstrated an understanding of the instructions.  Damaris Hippo , MD    CC: [place], [facility]

## 2019-07-02 ENCOUNTER — Telehealth (HOSPITAL_COMMUNITY): Payer: Self-pay

## 2019-07-02 NOTE — Telephone Encounter (Signed)
Spoke with the patient, instructions given. She stated that she would be here for her test. Asked to call back with any questions. S.Theoden Mauch EMTP

## 2019-07-05 ENCOUNTER — Ambulatory Visit (HOSPITAL_COMMUNITY): Payer: BC Managed Care – PPO | Attending: Internal Medicine

## 2019-07-05 ENCOUNTER — Other Ambulatory Visit: Payer: Self-pay

## 2019-07-05 DIAGNOSIS — R072 Precordial pain: Secondary | ICD-10-CM | POA: Diagnosis not present

## 2019-07-05 DIAGNOSIS — I2510 Atherosclerotic heart disease of native coronary artery without angina pectoris: Secondary | ICD-10-CM | POA: Diagnosis not present

## 2019-07-05 DIAGNOSIS — I10 Essential (primary) hypertension: Secondary | ICD-10-CM | POA: Insufficient documentation

## 2019-07-05 DIAGNOSIS — E782 Mixed hyperlipidemia: Secondary | ICD-10-CM | POA: Insufficient documentation

## 2019-07-05 DIAGNOSIS — Z95820 Peripheral vascular angioplasty status with implants and grafts: Secondary | ICD-10-CM | POA: Diagnosis not present

## 2019-07-05 DIAGNOSIS — I25119 Atherosclerotic heart disease of native coronary artery with unspecified angina pectoris: Secondary | ICD-10-CM | POA: Diagnosis not present

## 2019-07-05 LAB — MYOCARDIAL PERFUSION IMAGING
LV dias vol: 51 mL (ref 46–106)
LV sys vol: 21 mL
Peak HR: 100 {beats}/min
Rest HR: 74 {beats}/min
SDS: 0
SRS: 0
SSS: 0
TID: 0.96

## 2019-07-05 MED ORDER — REGADENOSON 0.4 MG/5ML IV SOLN
0.4000 mg | Freq: Once | INTRAVENOUS | Status: AC
  Administered 2019-07-05: 0.4 mg via INTRAVENOUS

## 2019-07-05 MED ORDER — TECHNETIUM TC 99M TETROFOSMIN IV KIT
31.4000 | PACK | Freq: Once | INTRAVENOUS | Status: AC | PRN
  Administered 2019-07-05: 31.4 via INTRAVENOUS
  Filled 2019-07-05: qty 32

## 2019-07-05 MED ORDER — TECHNETIUM TC 99M TETROFOSMIN IV KIT
9.9000 | PACK | Freq: Once | INTRAVENOUS | Status: AC | PRN
  Administered 2019-07-05: 9.9 via INTRAVENOUS
  Filled 2019-07-05: qty 10

## 2019-07-23 ENCOUNTER — Other Ambulatory Visit: Payer: Self-pay | Admitting: Nurse Practitioner

## 2019-07-23 ENCOUNTER — Ambulatory Visit (HOSPITAL_COMMUNITY)
Admission: RE | Admit: 2019-07-23 | Discharge: 2019-07-23 | Disposition: A | Discharge status: Home / Self Care | Payer: BC Managed Care – PPO | Source: Ambulatory Visit | Attending: Pulmonary Disease | Admitting: Pulmonary Disease

## 2019-07-23 ENCOUNTER — Encounter (HOSPITAL_COMMUNITY): Payer: Self-pay

## 2019-07-23 DIAGNOSIS — E119 Type 2 diabetes mellitus without complications: Secondary | ICD-10-CM

## 2019-07-23 DIAGNOSIS — R05 Cough: Secondary | ICD-10-CM | POA: Diagnosis not present

## 2019-07-23 DIAGNOSIS — Z20828 Contact with and (suspected) exposure to other viral communicable diseases: Secondary | ICD-10-CM | POA: Diagnosis not present

## 2019-07-23 DIAGNOSIS — U071 COVID-19: Secondary | ICD-10-CM | POA: Insufficient documentation

## 2019-07-23 DIAGNOSIS — R509 Fever, unspecified: Secondary | ICD-10-CM | POA: Diagnosis not present

## 2019-07-23 MED ORDER — SODIUM CHLORIDE 0.9 % IV SOLN
700.0000 mg | Freq: Once | INTRAVENOUS | Status: AC
  Administered 2019-07-23: 700 mg via INTRAVENOUS
  Filled 2019-07-23: qty 20

## 2019-07-23 MED ORDER — DIPHENHYDRAMINE HCL 50 MG/ML IJ SOLN: 50.0000 mg | Freq: Once | INTRAMUSCULAR | Status: DC | PRN

## 2019-07-23 MED ORDER — SODIUM CHLORIDE 0.9 % IV SOLN
INTRAVENOUS | Status: DC | PRN
  Administered 2019-07-23: 15:00:00 250 mL via INTRAVENOUS

## 2019-07-23 MED ORDER — FAMOTIDINE IN NACL 20-0.9 MG/50ML-% IV SOLN: 20.0000 mg | Freq: Once | INTRAVENOUS | Status: DC | PRN

## 2019-07-23 MED ORDER — ALBUTEROL SULFATE HFA 108 (90 BASE) MCG/ACT IN AERS: 2.0000 | INHALATION_SPRAY | Freq: Once | RESPIRATORY_TRACT | Status: DC | PRN

## 2019-07-23 MED ORDER — EPINEPHRINE 0.3 MG/0.3ML IJ SOAJ: 0.3000 mg | Freq: Once | INTRAMUSCULAR | Status: DC | PRN

## 2019-07-23 MED ORDER — METHYLPREDNISOLONE SODIUM SUCC 125 MG IJ SOLR: 125.0000 mg | Freq: Once | INTRAMUSCULAR | Status: DC | PRN

## 2019-07-23 NOTE — Progress Notes (Signed)
  Diagnosis: COVID-19  Physician: Dr. Joya Gaskins  Procedure: [facility] Med: bamlanivimab infusion - Provided patient with bamlanimivab fact sheet for patients, parents and caregivers prior to infusion.  Complications: No immediate complications noted.  Discharge: Discharged home   Babs Sciara 07/23/2019

## 2019-07-23 NOTE — Discharge Instructions (Signed)

## 2019-07-23 NOTE — Progress Notes (Signed)
  I connected by phone with [name] on 07/23/2019 at 2:29 PM to discuss the potential use of an new treatment for mild to moderate COVID-19 viral infection in non-hospitalized patients.  This patient is a 59 y.o. female that meets the FDA criteria for Emergency Use Authorization of bamlanivimab or casirivimab\imdevimab.  Has a (+) direct SARS-CoV-2 viral test result  Has mild or moderate COVID-19   Is ? 59 years of age and weighs ? 40 kg  Is NOT hospitalized due to COVID-19  Is NOT requiring oxygen therapy or requiring an increase in baseline oxygen flow rate due to COVID-19  Is within 10 days of symptom onset  Has at least one of the high risk factor(s) for progression to severe COVID-19 and/or hospitalization as defined in EUA.  Specific high risk criteria : Diabetes   I have spoken and communicated the following to the patient or parent/caregiver:  1. FDA has authorized the emergency use of bamlanivimab and casirivimab\imdevimab for the treatment of mild to moderate COVID-19 in adults and pediatric patients with positive results of direct SARS-CoV-2 viral testing who are 9 years of age and older weighing at least 40 kg, and who are at high risk for progressing to severe COVID-19 and/or hospitalization.  2. The significant known and potential risks and benefits of bamlanivimab and casirivimab\imdevimab, and the extent to which such potential risks and benefits are unknown.  3. Information on available alternative treatments and the risks and benefits of those alternatives, including clinical trials.  4. Patients treated with bamlanivimab and casirivimab\imdevimab should continue to self-isolate and use infection control measures (e.g., wear mask, isolate, social distance, avoid sharing personal items, clean and disinfect "high touch" surfaces, and frequent handwashing) according to CDC guidelines.   5. The patient or parent/caregiver has the option to accept or refuse  bamlanivimab or casirivimab\imdevimab .  After reviewing this information with the patient, The patient agreed to proceed with receiving the bamlanimivab infusion and will be provided a copy of the Fact sheet prior to receiving the infusion.Fenton Foy 07/23/2019 2:29 PM

## 2019-08-01 ENCOUNTER — Emergency Department (HOSPITAL_COMMUNITY)
Admission: EM | Admit: 2019-08-01 | Discharge: 2019-08-01 | Disposition: A | Discharge status: Home / Self Care | Payer: BC Managed Care – PPO | Attending: Emergency Medicine | Admitting: Emergency Medicine

## 2019-08-01 ENCOUNTER — Other Ambulatory Visit: Payer: Self-pay

## 2019-08-01 ENCOUNTER — Encounter (HOSPITAL_COMMUNITY): Payer: Self-pay | Admitting: Emergency Medicine

## 2019-08-01 ENCOUNTER — Emergency Department (HOSPITAL_COMMUNITY): Payer: BC Managed Care – PPO

## 2019-08-01 DIAGNOSIS — E119 Type 2 diabetes mellitus without complications: Secondary | ICD-10-CM | POA: Diagnosis not present

## 2019-08-01 DIAGNOSIS — I252 Old myocardial infarction: Secondary | ICD-10-CM | POA: Insufficient documentation

## 2019-08-01 DIAGNOSIS — Z87891 Personal history of nicotine dependence: Secondary | ICD-10-CM | POA: Insufficient documentation

## 2019-08-01 DIAGNOSIS — I2510 Atherosclerotic heart disease of native coronary artery without angina pectoris: Secondary | ICD-10-CM | POA: Insufficient documentation

## 2019-08-01 DIAGNOSIS — Z79899 Other long term (current) drug therapy: Secondary | ICD-10-CM | POA: Insufficient documentation

## 2019-08-01 DIAGNOSIS — R05 Cough: Secondary | ICD-10-CM | POA: Diagnosis not present

## 2019-08-01 DIAGNOSIS — M7918 Myalgia, other site: Secondary | ICD-10-CM | POA: Insufficient documentation

## 2019-08-01 DIAGNOSIS — R5383 Other fatigue: Secondary | ICD-10-CM | POA: Diagnosis not present

## 2019-08-01 DIAGNOSIS — U071 COVID-19: Secondary | ICD-10-CM | POA: Insufficient documentation

## 2019-08-01 DIAGNOSIS — Z7984 Long term (current) use of oral hypoglycemic drugs: Secondary | ICD-10-CM | POA: Diagnosis not present

## 2019-08-01 DIAGNOSIS — R5381 Other malaise: Secondary | ICD-10-CM | POA: Insufficient documentation

## 2019-08-01 DIAGNOSIS — R438 Other disturbances of smell and taste: Secondary | ICD-10-CM | POA: Diagnosis not present

## 2019-08-01 DIAGNOSIS — R509 Fever, unspecified: Secondary | ICD-10-CM | POA: Diagnosis not present

## 2019-08-01 MED ORDER — ONDANSETRON 8 MG PO TBDP
8.0000 mg | ORAL_TABLET | Freq: Once | ORAL | Status: AC
  Administered 2019-08-01: 8 mg via ORAL
  Filled 2019-08-01: qty 1

## 2019-08-01 MED ORDER — IBUPROFEN 800 MG PO TABS
800.0000 mg | ORAL_TABLET | Freq: Once | ORAL | Status: AC
  Administered 2019-08-01: 800 mg via ORAL
  Filled 2019-08-01: qty 1

## 2019-08-01 NOTE — Discharge Instructions (Addendum)
Your COVID test is pending; you should expect results in 2-3 days. You can access your results on your MyChart--if you test positive you should receive a phone call.  In the meantime follow CDC guidelines and quarantine, wear a mask, wash hands often.   Please take over the counter vitamin D 2000-4000 units per day. I also recommend zinc 50 mg per day for the next two weeks.   Please return to ED if you feel have difficulty breathing or have emergent, new or concerning symptoms.  Patients who have symptoms consistent with COVID-19 should self isolated for: At least 3 days (72 hours) have passed since recovery, defined as resolution of fever without the use of fever reducing medications and improvement in respiratory symptoms (e.g., cough, shortness of breath), and At least 7 days have passed since symptoms first appeared.       Person Under Monitoring Name: [name]  Location: [place] [address]   Infection Prevention Recommendations for Individuals Confirmed to have, or Being Evaluated for, 2019 Novel Coronavirus (COVID-19) Infection Who Receive Care at Home  Individuals who are confirmed to have, or are being evaluated for, COVID-19 should follow the prevention steps below until a healthcare provider or local or state health department says they can return to normal activities.  Stay home except to get medical care You should restrict activities outside your home, except for getting medical care. Do not go to work, school, or public areas, and do not use public transportation or taxis.  Call ahead before visiting your doctor Before your medical appointment, call the healthcare provider and tell them that you have, or are being evaluated for, COVID-19 infection. This will help the healthcare provider's office take steps to keep other people from getting infected. Ask your healthcare provider to call the local or state health department.  Monitor your  symptoms Seek prompt medical attention if your illness is worsening (e.g., difficulty breathing). Before going to your medical appointment, call the healthcare provider and tell them that you have, or are being evaluated for, COVID-19 infection. Ask your healthcare provider to call the local or state health department.  Wear a facemask You should wear a facemask that covers your nose and mouth when you are in the same room with other people and when you visit a healthcare provider. People who live with or visit you should also wear a facemask while they are in the same room with you.  Separate yourself from other people in your home As much as possible, you should stay in a different room from other people in your home. Also, you should use a separate bathroom, if available.  Avoid sharing household items You should not share dishes, drinking glasses, cups, eating utensils, towels, bedding, or other items with other people in your home. After using these items, you should wash them thoroughly with soap and water.  Cover your coughs and sneezes Cover your mouth and nose with a tissue when you cough or sneeze, or you can cough or sneeze into your sleeve. Throw used tissues in a lined trash can, and immediately wash your hands with soap and water for at least 20 seconds or use an alcohol-based hand rub.  Wash your [facility] your hands often and thoroughly with soap and water for at least 20 seconds. You can use an alcohol-based hand sanitizer if soap and water are not available and if your hands are not visibly dirty. Avoid touching your eyes, nose, and  mouth with unwashed hands.   Prevention Steps for Caregivers and Household Members of Individuals Confirmed to have, or Being Evaluated for, COVID-19 Infection Being Cared for in the Home  If you live with, or provide care at home for, a person confirmed to have, or being evaluated for, COVID-19 infection please follow these guidelines  to prevent infection:  Follow healthcare provider's instructions Make sure that you understand and can help the patient follow any healthcare provider instructions for all care.  Provide for the patient's basic needs You should help the patient with basic needs in the home and provide support for getting groceries, prescriptions, and other personal needs.  Monitor the patient's symptoms If they are getting sicker, call his or her medical provider and tell them that the patient has, or is being evaluated for, COVID-19 infection. This will help the healthcare provider's office take steps to keep other people from getting infected. Ask the healthcare provider to call the local or state health department.  Limit the number of people who have contact with the patient If possible, have only one caregiver for the patient. Other household members should stay in another home or place of residence. If this is not possible, they should stay in another room, or be separated from the patient as much as possible. Use a separate bathroom, if available. Restrict visitors who do not have an essential need to be in the home.  Keep older adults, very young children, and other sick people away from the patient Keep older adults, very young children, and those who have compromised immune systems or chronic health conditions away from the patient. This includes people with chronic heart, lung, or kidney conditions, diabetes, and cancer.  Ensure good ventilation Make sure that shared spaces in the home have good air flow, such as from an air conditioner or an opened window, weather permitting.  Wash your hands often Wash your hands often and thoroughly with soap and water for at least 20 seconds. You can use an alcohol based hand sanitizer if soap and water are not available and if your hands are not visibly dirty. Avoid touching your eyes, nose, and mouth with unwashed hands. Use disposable paper towels to  dry your hands. If not available, use dedicated cloth towels and replace them when they become wet.  Wear a facemask and gloves Wear a disposable facemask at all times in the room and gloves when you touch or have contact with the patient's blood, body fluids, and/or secretions or excretions, such as sweat, saliva, sputum, nasal mucus, vomit, urine, or feces.  Ensure the mask fits over your nose and mouth tightly, and do not touch it during use. Throw out disposable facemasks and gloves after using them. Do not reuse. Wash your hands immediately after removing your facemask and gloves. If your personal clothing becomes contaminated, carefully remove clothing and launder. Wash your hands after handling contaminated clothing. Place all used disposable facemasks, gloves, and other waste in a lined container before disposing them with other household waste. Remove gloves and wash your hands immediately after handling these items.  Do not share dishes, glasses, or other household items with the patient Avoid sharing household items. You should not share dishes, drinking glasses, cups, eating utensils, towels, bedding, or other items with a patient who is confirmed to have, or being evaluated for, COVID-19 infection. After the person uses these items, you should wash them thoroughly with soap and water.  Wash laundry thoroughly Immediately remove and wash  clothes or bedding that have blood, body fluids, and/or secretions or excretions, such as sweat, saliva, sputum, nasal mucus, vomit, urine, or feces, on them. Wear gloves when handling laundry from the patient. Read and follow directions on labels of laundry or clothing items and detergent. In general, wash and dry with the warmest temperatures recommended on the label.  Clean all areas the individual has used often Clean all touchable surfaces, such as counters, tabletops, doorknobs, bathroom fixtures, toilets, phones, keyboards, tablets, and bedside  tables, every day. Also, clean any surfaces that may have blood, body fluids, and/or secretions or excretions on them. Wear gloves when cleaning surfaces the patient has come in contact with. Use a diluted bleach solution (e.g., dilute bleach with 1 part bleach and 10 parts water) or a household disinfectant with a label that says EPA-registered for coronaviruses. To make a bleach solution at home, add 1 tablespoon of bleach to 1 quart (4 cups) of water. For a larger supply, add  cup of bleach to 1 gallon (16 cups) of water. Read labels of cleaning products and follow recommendations provided on product labels. Labels contain instructions for safe and effective use of the cleaning product including precautions you should take when applying the product, such as wearing gloves or eye protection and making sure you have good ventilation during use of the product. Remove gloves and wash hands immediately after cleaning.  Monitor yourself for signs and symptoms of illness Caregivers and household members are considered close contacts, should monitor their health, and will be asked to limit movement outside of the home to the extent possible. Follow the monitoring steps for close contacts listed on the symptom monitoring form.   ? If you have additional questions, contact your local health department or call the epidemiologist on call at [phone] (available 24/7). ? This guidance is subject to change. For the most up-to-date guidance from [facility], please refer to their website: [email or web address]

## 2019-08-01 NOTE — ED Notes (Signed)
SPO2 while ambulating: 93-94%. She is drinking her own bottle of bottle water as I write this.

## 2019-08-01 NOTE — ED Notes (Signed)
Patient not found in her room. Will check again to see if she returns and will discharge her at that time.

## 2019-08-01 NOTE — ED Triage Notes (Signed)
Patient complains of fever, cough, back pain and chills since 07/23/19. On that date she found out she was covid positive. She has had one infusion since.

## 2019-08-01 NOTE — ED Notes (Signed)
Patient left before the discharge process could be completed.

## 2019-08-01 NOTE — ED Provider Notes (Signed)
[facility] DEPT Provider Note   CSN: [id number] Arrival date & time: 08/01/19  1314     History Chief Complaint  Patient presents with  . Fever  . Cough  . Back Pain  . Chills    [name] is a 59 y.o. female.  HPI Patient is a 59 year old female for well-controlled DM, CAD, grade 1 diastolic dysfunction, HTN, GERD  Patient presented today for 11 days of fever, cough, body aches, congestion, fatigue, malaise.  She states that she tested positive for Covid 2/22 and is felt consistent symptoms since that time.  She states she has decreased sense of taste and smell as well.  She states she has not been eating very much food as she has very little appetite and sometimes feels nauseous when attempting to eat.  She has had 1 infusion done at outside facility.  She denies any symptoms, denies any rashes or cellulitis, denies any shortness of breath, denies any weakness or sensation changes.  Denies any diarrhea or abdominal pain.      Past Medical History:  Diagnosis Date  . Allergic rhinitis due to pollen   . Allergy   . Anxiety state, unspecified   . Arthritis   . Bronchitis    hx  . CAD (coronary artery disease)    s/p NSTEMI in 11/18 treated with a DES to the proximal LAD  . Calculus of gallbladder without mention of cholecystitis or obstruction   . Crohn disease ([place])   . Depressive disorder, not elsewhere classified   . Diabetes mellitus without complication (HCC)    diet controlled  . Esophageal reflux   . Fibromyalgia   . Grade I diastolic dysfunction    Noted on ECHO  . Headache   . History of nuclear stress test    ETT-Myoview 10/17: EF 55%, normal perfusion; Low Risk // Nuclear stress test 05/2018:  EF 63, normal perfusion; Low Risk  . Myalgia and myositis, unspecified   . NSTEMI (non-ST elevated myocardial infarction) ([place]) 04/15/2017  . Other and unspecified hyperlipidemia   . Palpitations   . Pneumonia 13   hx  . S/P  angioplasty with stent 04/18/17 with DES to pLAD 04/19/2017  . Seizures ([place])    1 seizure as a teenager   . Unspecified essential hypertension   . Vitamin B deficiency     Patient Active Problem List   Diagnosis Date Noted  . Myalgia due to HMG CoA reductase inhibitor 02/16/2019  . History of colonic polyps   . Adenomatous polyp of cecum   . Polyp of transverse colon   . Rectal pain 02/14/2018  . History of Crohn's disease 02/14/2018  . Chronic fatigue 01/14/2018  . Obstructive sleep apnea 09/17/2017  . Diabetes mellitus ([place]) 09/17/2017  . S/P angioplasty with stent 04/18/17 with DES to pLAD 04/19/2017  . NSTEMI (non-ST elevated myocardial infarction) ([place])   . SOB (shortness of breath)   . Family history of early CAD   . Chest pain 04/16/2017  . Fatty infiltration of liver 12/23/2015  . Coronary artery disease involving native coronary artery of native heart without angina pectoris 10/10/2011  . Diabetes mellitus type 2, uncomplicated ([place]) 87/86/7672  . Essential hypertension 08/18/2009  . CHEST PAIN UNSPECIFIED 08/01/2009  . B12 DEFICIENCY 01/06/2009  . Hyperlipidemia LDL goal <70 03/16/2007  . ANXIETY 03/16/2007  . DEPRESSION 03/16/2007  . Seasonal and perennial allergic rhinitis 03/16/2007  . GERD 03/16/2007  . CHOLELITHIASIS W/O CHOLECYSTITIS W/O OBST  03/16/2007  . SACROILIITIS NEC 03/16/2007  . FIBROMYALGIA 03/16/2007  . PALPITATIONS 03/16/2007  . Crohn's disease ([place]) 03/16/2007    Past Surgical History:  Procedure Laterality Date  . BIOPSY  12/05/2018   Procedure: BIOPSY;  Surgeon: Mauri Pole, MD;  Location: [facility];  Service: Endoscopy;;  . CHOLECYSTECTOMY N/A 09/19/2014   Procedure: LAPAROSCOPIC CHOLECYSTECTOMY WITH INTRAOPERATIVE CHOLANGIOGRAM;  Surgeon: Donnie Mesa, MD;  Location: [facility];  Service: General;  Laterality: N/A;  . COLONOSCOPY WITH PROPOFOL N/A 12/05/2018   Procedure: COLONOSCOPY WITH PROPOFOL;  Surgeon: Mauri Pole, MD;   Location: [facility];  Service: Endoscopy;  Laterality: N/A;  chromoendoscopy  . CORONARY STENT INTERVENTION N/A 04/18/2017   Procedure: CORONARY STENT INTERVENTION;  Surgeon: Martinique, Peter M, MD;  Location: [facility];  Service: Cardiovascular;  Laterality: N/A;  . ESOPHAGOGASTRODUODENOSCOPY    . EXPLORATORY LAPAROTOMY     for endometriosis  . INTRAVASCULAR PRESSURE WIRE/FFR STUDY N/A 04/18/2017   Procedure: INTRAVASCULAR PRESSURE WIRE/FFR STUDY;  Surgeon: Martinique, Peter M, MD;  Location: [facility];  Service: Cardiovascular;  Laterality: N/A;  . LEFT HEART CATH AND CORONARY ANGIOGRAPHY N/A 04/18/2017   Procedure: LEFT HEART CATH AND CORONARY ANGIOGRAPHY;  Surgeon: Martinique, Peter M, MD;  Location: [facility];  Service: Cardiovascular;  Laterality: N/A;  . POLYPECTOMY  12/05/2018   Procedure: POLYPECTOMY;  Surgeon: Mauri Pole, MD;  Location: [facility];  Service: Endoscopy;;  . SUBMUCOSAL LIFTING INJECTION  12/05/2018   Procedure: SUBMUCOSAL LIFTING INJECTION;  Surgeon: Mauri Pole, MD;  Location: [facility];  Service: Endoscopy;;  . TONSILLECTOMY AND ADENOIDECTOMY    . TUBAL LIGATION     BILATERAL     OB History   No obstetric history on file.     Family History  Problem Relation Age of Onset  . Lung cancer Maternal Grandmother   . Stroke Father   . Hypertension Mother   . Heart attack Mother   . Heart disease Mother   . Hypertension Brother   . Heart disease Brother   . Hypertension Sister   . Heart disease Sister   . Coronary artery disease Brother   . Colon cancer Neg Hx   . Stomach cancer Neg Hx   . Esophageal cancer Neg Hx   . Rectal cancer Neg Hx     Social History   Tobacco Use  . Smoking status: Former Smoker    Packs/day: 1.00    Years: 35.00    Pack years: 35.00    Types: Cigarettes    Quit date: 05/31/2004    Years since quitting: 15.1  . Smokeless tobacco: Never Used  Substance Use Topics  . Alcohol use: No  .  Drug use: No    Home Medications Prior to Admission medications   Medication Sig Start Date End Date Taking? Authorizing Provider  albuterol (PROVENTIL) (2.5 MG/3ML) 0.083% nebulizer solution Take 2.5 mg by nebulization every 4 (four) hours as needed.     [provider]  aspirin 81 MG chewable tablet Chew 81 mg by mouth daily.      [provider]  budesonide (ENTOCORT EC) 3 MG 24 hr capsule TAKE 3 CAPSULES BY MOUTH ONCE DAILY 05/21/19   Nandigam, Venia Minks, MD  busPIRone (BUSPAR) 10 MG tablet Take 10 mg by mouth 2 (two) times daily.    [provider]  colestipol (COLESTID) 1 g tablet TAKE ONE TABLET BY MOUTH TWICE DAILY 05/21/19   Nandigam,  Venia Minks, MD  DULoxetine (CYMBALTA) 60 MG capsule Take 60 mg by mouth daily.    [provider]  enalapril (VASOTEC) 5 MG tablet Take 1 tablet (5 mg total) by mouth daily. 06/24/14   Larey Dresser, MD  EPIPEN 2-PAK 0.3 MG/0.3ML SOAJ injection Inject 0.3 mg into the muscle as needed for anaphylaxis. Reported on 06/27/2015 10/09/14   [provider]  fenofibrate (TRICOR) 145 MG tablet TAKE ONE TABLET BY MOUTH ONCE DAILY 08/07/18   Bhagat, Bhavinkumar, PA  glimepiride (AMARYL) 2 MG tablet Take 2 mg by mouth 2 (two) times a day.    [provider]  loratadine (CLARITIN) 10 MG tablet Take 10 mg by mouth daily.    [provider]  metFORMIN (GLUCOPHAGE) 500 MG tablet Take 1,000 mg by mouth 2 (two) times daily with a meal.    [provider]  metoprolol succinate (TOPROL XL) 25 MG 24 hr tablet Take 1 tablet (25 mg total) by mouth daily. 12/12/18   Dorothy Spark, MD  montelukast (SINGULAIR) 10 MG tablet Take 10 mg by mouth at bedtime.    [provider]  Multiple Vitamins-Minerals (MULTIVITAMIN WITH MINERALS) tablet Take 1 tablet by mouth daily.    [provider]  nitroGLYCERIN (NITROSTAT) 0.4 MG SL tablet Place 0.4 mg under the tongue every 5 (five) minutes as needed for  chest pain.    [provider]  pantoprazole (PROTONIX) 40 MG tablet TAKE 1 TABLET BY MOUTH ONCE DAILY 04/24/19   Richardson Dopp T, PA-C  Probiotic Product (PROBIOTIC DAILY PO) Take 1 capsule by mouth daily.    [provider]  vedolizumab (ENTYVIO) 300 MG injection Inject 300 mg into the vein every 8 (eight) weeks. After the standard induction 02/21/18   Mauri Pole, MD  Vitamin D3 (VITAMIN D) 25 MCG tablet Take 1,000 Units by mouth at bedtime.    [provider]    Allergies    Lipitor [atorvastatin], Remicade [infliximab], Ticagrelor, and Rosuvastatin  Review of Systems   Review of Systems  Constitutional: Positive for appetite change, chills, fatigue and fever.  HENT: Negative for congestion.   Eyes: Negative for pain.  Respiratory: Positive for cough. Negative for shortness of breath.   Cardiovascular: Negative for chest pain and leg swelling.  Gastrointestinal: Positive for nausea. Negative for abdominal pain and vomiting.  Genitourinary: Negative for dysuria.  Musculoskeletal: Negative for myalgias.  Skin: Negative for rash.  Neurological: Negative for dizziness, light-headedness, numbness and headaches.    Physical Exam Updated Vital Signs BP 118/65   Pulse 98   Temp (!) 103.2 F (39.6 C) (Oral)   Resp 18   Ht 5' 2"  (1.575 m)   LMP  (LMP Unknown)   SpO2 94%   BMI 25.61 kg/m   Physical Exam Vitals and nursing note reviewed.  Constitutional:      General: She is not in acute distress.    Comments: Patient is 58 year old female in no acute distress.  Appears somewhat fatigued.  HENT:     Head: Normocephalic and atraumatic.     Nose: Nose normal.     Mouth/Throat:     Mouth: Mucous membranes are dry.  Eyes:     General: No scleral icterus. Cardiovascular:     Rate and Rhythm: Normal rate and regular rhythm.     Pulses: Normal pulses.     Heart sounds: Normal heart sounds.  Pulmonary:     Effort: Pulmonary effort is normal. No  respiratory distress.     Breath sounds: Rales present. No wheezing.     Comments: Crackles in the left lower base auscultated posteriorly.  Lungs otherwise clear to auscultation bilaterally.  No increased work of breathing.  Speaking full sentences.  I independently ambulated the patient with pulse oximetry stayed at 96-97% on room air Abdominal:     Palpations: Abdomen is soft.     Tenderness: There is no abdominal tenderness. There is no right CVA tenderness, left CVA tenderness, guarding or rebound.  Musculoskeletal:     Cervical back: Normal range of motion.     Right lower leg: No edema.     Left lower leg: No edema.  Skin:    General: Skin is warm and dry.     Capillary Refill: Capillary refill takes less than 2 seconds.  Neurological:     Mental Status: She is alert. Mental status is at baseline.  Psychiatric:        Mood and Affect: Mood normal.        Behavior: Behavior normal.     ED Results / Procedures / Treatments   Labs (all labs ordered are listed, but only abnormal results are displayed) Labs Reviewed - No data to display  EKG None  Radiology DG Chest Portable 1 View  Result Date: 08/01/2019 CLINICAL DATA:  COVID positive. Abnormal breath sounds. Fever, cough and back pain. EXAM: PORTABLE CHEST 1 VIEW COMPARISON:  04/23/2019 and CT chest 04/29/2011. FINDINGS: Trachea is midline. Heart size normal. Thoracic aorta is calcified. Streaky opacities at the left lung base appears similar to 04/23/2019. There may be minimal right basilar hazy airspace opacification. Lungs are otherwise clear. No pleural fluid. IMPRESSION: 1. Minimal hazy opacification of the right lung base may be due to atelectasis. Difficult to exclude COVID-19 pneumonia. 2. Favor scarring in the left lung base. Electronically Signed   By: Lorin Picket M.D.   On: 08/01/2019 14:31    Procedures Procedures (including critical care time)  Medications Ordered in ED Medications  ondansetron  (ZOFRAN-ODT) disintegrating tablet 8 mg (8 mg Oral Given 08/01/19 1419)  ibuprofen (ADVIL) tablet 800 mg (800 mg Oral Given 08/01/19 1419)    ED Course  I have reviewed the triage vital signs and the nursing notes.  Pertinent labs & imaging results that were available during my care of the patient were reviewed by me and considered in my medical decision making (see chart for details).    MDM Rules/Calculators/A&P                      Patient 59 year old female with known Covid diagnosis presented today with consistent symptoms since 11 days ago.  She states that she has no shortness of breath but does endorse cough, fever, congestion, headache, malaise, body aches.  She has been taking Tylenol every 6 hours.  She states this helps for some time with resolved before her next dose.  She has not been taking any ibuprofen.  She has no history of kidney disease or GI bleeds.   Physical exam remarkable for no acute work of breathing.  Lungs are clear apart from some mild crackles in the left lower base.  I newly reviewed the x-rays from today as well as x-ray conducted 1.5 years ago  which appeared unchanged.  There is some small opacity in the left lower base which was present on prior x-ray.  She has no tachypnea or dyspnea.  She has had no double sickening  to indicate a bacterial superinfection.  She decision-making physician patient will discharge with conservative management.  Will use ibuprofen and Tylenol alternating doses.  She is ambulating without desaturation.  Is not tachypneic or dyspneic.  Fever improved after ibuprofen.  I discussed this case with my attending physician who cosigned this note including patient's presenting symptoms, physical exam, and planned diagnostics and interventions. Attending physician stated agreement with plan or made changes to plan which were implemented.   [name] was evaluated in Emergency Department on 08/01/2019 for the symptoms described in the history  of present illness. She was evaluated in the context of the global COVID-19 pandemic, which necessitated consideration that the patient might be at risk for infection with the SARS-CoV-2 virus that causes COVID-19. Institutional protocols and algorithms that pertain to the evaluation of patients at risk for COVID-19 are in a state of rapid change based on information released by regulatory bodies including the CDC and federal and state organizations. These policies and algorithms were followed during the patient's care in the ED.   Final Clinical Impression(s) / ED Diagnoses Final diagnoses:  [id number]    Rx / DC Orders ED Discharge Orders    None       Tedd Sias, [place] 08/01/19 1449    Sherwood Gambler, MD 08/04/19 [phone]

## 2019-08-20 ENCOUNTER — Other Ambulatory Visit: Payer: Self-pay | Admitting: Physician Assistant

## 2019-08-22 ENCOUNTER — Telehealth: Payer: Self-pay | Admitting: Cardiology

## 2019-08-22 DIAGNOSIS — Z95820 Peripheral vascular angioplasty status with implants and grafts: Secondary | ICD-10-CM

## 2019-08-22 DIAGNOSIS — K50813 Crohn's disease of both small and large intestine with fistula: Secondary | ICD-10-CM | POA: Diagnosis not present

## 2019-08-22 DIAGNOSIS — I25119 Atherosclerotic heart disease of native coronary artery with unspecified angina pectoris: Secondary | ICD-10-CM

## 2019-08-22 DIAGNOSIS — E782 Mixed hyperlipidemia: Secondary | ICD-10-CM

## 2019-08-22 DIAGNOSIS — Z789 Other specified health status: Secondary | ICD-10-CM

## 2019-08-22 DIAGNOSIS — I2510 Atherosclerotic heart disease of native coronary artery without angina pectoris: Secondary | ICD-10-CM

## 2019-08-22 DIAGNOSIS — K760 Fatty (change of) liver, not elsewhere classified: Secondary | ICD-10-CM

## 2019-08-22 NOTE — Telephone Encounter (Signed)
Yes, because she has had an MI already and has DM she needs to be on at least moderate dose of a statin, I would recommend rosuvastatin 20 mg po daily.

## 2019-08-22 NOTE — Telephone Encounter (Signed)
Dorothy Spark, MD  Nuala Alpha, LPN  Actually she has listed allergy to rosuva and atorvastatin, please refer her to the lipid clinic with [facility].  Thank you,  Ena Dawley    Spoke with the pt and informed her that per Dr. Meda Coffee, being she has rosuvastatin and atorvastatin listed as an allergy causing her muscle aches and elevated ALT, we will now refer her to lipid clinic to see [place] PharmD.  Informed the pt that I will place the referral in the system and send a message to our [facility] scheduler to call her back and arrange lipid clinic appt with [place] PharmD.  Pt verbalized understanding and agrees with this plan.

## 2019-08-22 NOTE — Telephone Encounter (Addendum)
Patient can't remember which statin medication she is to be on, she was calling to verify.

## 2019-08-22 NOTE — Telephone Encounter (Signed)
Dr. Meda Coffee, the pt is calling to ask what statin she should be on, for she is unsure what she is to take. Pt states she is taking her Fenofibrate 145 mg po daily, but is wandering if she should still be on a statin too.  We when last saw her, your note mentioned she should continue crestor, but the pt states she hasn't taken this in awhile and for what she reports, "I thought I was on atorvastatin." Pt recently had a lexiscan done at our office and results were normal. Advised her to continue her Fenofibrate regimen, and I will get clarification from you on what statin she should be on, and at what dose.  Informed the pt that I will call her shortly thereafter, once recommendations received. Pt states if she doesn't answer the phone, we can just send in the statin prescription to [facility].  Pt verbalized understanding and agrees with this plan.

## 2019-08-24 NOTE — Telephone Encounter (Signed)
Pts lipid clinic appt is scheduled for 09/07/19 at 2:30 pm with Fuller [place] PharmD.  Pt made aware of appt date and time by Anza.

## 2019-09-07 ENCOUNTER — Other Ambulatory Visit: Payer: Self-pay

## 2019-09-07 ENCOUNTER — Ambulatory Visit (INDEPENDENT_AMBULATORY_CARE_PROVIDER_SITE_OTHER): Payer: BC Managed Care – PPO | Admitting: Pharmacist

## 2019-09-07 DIAGNOSIS — T466X5A Adverse effect of antihyperlipidemic and antiarteriosclerotic drugs, initial encounter: Secondary | ICD-10-CM | POA: Insufficient documentation

## 2019-09-07 DIAGNOSIS — E785 Hyperlipidemia, unspecified: Secondary | ICD-10-CM

## 2019-09-07 DIAGNOSIS — G720 Drug-induced myopathy: Secondary | ICD-10-CM

## 2019-09-07 NOTE — Progress Notes (Signed)
Patient ID: [name]                 DOB: 1961/02/19                    MRN: [id number]     HPI: [name] is a 59 y.o. female patient referred to lipid clinic by Dr [name]. PMH is significant for CAD s/p NSTEMI 03/2017 treated with DES to prox LAD, DM, HTN, GERD, and Crohn's disease. Pt reported some atypical chest pain and fatigue with exertion at her last visit in January 2021. She underwent stress test which showed normal perfusion and was low risk. I have previously seen pt in lipid clinic in 2017 before her MI. At that time, she had previously undergone coronary calcium scoring which was 4 (87th percentile). At that time, she was started on low dose rosuvastatin and fenofibrate, and Colestipol was stopped to reduce pill burden and due to lack of CV outcomes. She still continues on fenofibrate but has now stopped rosuvastatin as well due to intolerance.  Pt presents today in good spirits. Believes she was taking rosuvastatin when her labs were drawn last August. Atorvastatin previously caused severe myalgias in her legs that restricted her movement. She also noticed muscle pain with both pravastatin and rosuvastatin. She has been off statin therapy for at least a few months. Still takes her fenofibrate which she is tolerating well. Has a sweet tooth and is trying to cut back on her dessert intake. Knows to watch her sugar since she has DM too.  Current Medications: fenofibrate 146m daily Intolerances: rosuvastatin 5-20mg daily, (muscle aches), atorvastatin 20-40mg daily (elevated ALT), pravastatin 10-80mg daily, colestipol (stopped to decrease pill burden) Risk Factors: CAD s/p MI, DM, HTN, family hx LDL goal: <566mdL  Diet: Eating more junk food lately - chocolate covered desserts. Yesterday ate fruit, yogurt, cheese, avocados. Limits red meat and fried food. Uses an air fryer.  Exercise: Walks daily.  Family History: Mother (CAD, MI, decreased at 6770 father (Stroke, MI,  endocarditis, deceased at 5422 brother (CAD, aortic aneurysm, HTN ), sister (HTN, MI, deceased at 6362 Her brother's son and grandson have both suffered from strokes (3344nd 1975o respectively).   Social History: Former smoker 1 PPD, quit in 202831denies illicit drug use and alcohol use.  Labs: 01/19/19: TC 152, TG 178, HDL 48, LDL 68 (thinks she was taking rosuvastatin)  Past Medical History:  Diagnosis Date  . Allergic rhinitis due to pollen   . Allergy   . Anxiety state, unspecified   . Arthritis   . Bronchitis    hx  . CAD (coronary artery disease)    s/p NSTEMI in 11/18 treated with a DES to the proximal LAD  . Calculus of gallbladder without mention of cholecystitis or obstruction   . Crohn disease ([facility] [place]  . Depressive disorder, not elsewhere classified   . Diabetes mellitus without complication (HCC)    diet controlled  . Esophageal reflux   . Fibromyalgia   . Grade I diastolic dysfunction    Noted on ECHO  . Headache   . History of nuclear stress test    ETT-Myoview 10/17: EF 55%, normal perfusion; Low Risk // Nuclear stress test 05/2018:  EF 63, normal perfusion; Low Risk  . Myalgia and myositis, unspecified   . NSTEMI (non-ST elevated myocardial infarction) ([place]/16/2018  . Other and unspecified hyperlipidemia   . Palpitations   . Pneumonia 13  hx  . S/P angioplasty with stent 04/18/17 with DES to pLAD 04/19/2017  . Seizures ([place])    1 seizure as a teenager   . Unspecified essential hypertension   . Vitamin B deficiency     Current Outpatient Medications on File Prior to Visit  Medication Sig Dispense Refill  . albuterol (PROVENTIL) (2.5 MG/3ML) 0.083% nebulizer solution Take 2.5 mg by nebulization every 4 (four) hours as needed.     Marland Kitchen aspirin 81 MG chewable tablet Chew 81 mg by mouth daily.      . budesonide (ENTOCORT EC) 3 MG 24 hr capsule TAKE 3 CAPSULES BY MOUTH ONCE DAILY 270 capsule 2  . busPIRone (BUSPAR) 10 MG tablet Take 10 mg by mouth 2 (two) times  daily.    . colestipol (COLESTID) 1 g tablet TAKE ONE TABLET BY MOUTH TWICE DAILY 60 tablet 2  . DULoxetine (CYMBALTA) 60 MG capsule Take 60 mg by mouth daily.    . enalapril (VASOTEC) 5 MG tablet Take 1 tablet (5 mg total) by mouth daily. 30 tablet 11  . EPIPEN 2-PAK 0.3 MG/0.3ML SOAJ injection Inject 0.3 mg into the muscle as needed for anaphylaxis. Reported on 06/27/2015  0  . fenofibrate (TRICOR) 145 MG tablet TAKE ONE TABLET BY MOUTH ONCE DAILY 90 tablet 3  . glimepiride (AMARYL) 2 MG tablet Take 2 mg by mouth 2 (two) times a day.    . loratadine (CLARITIN) 10 MG tablet Take 10 mg by mouth daily.    . metFORMIN (GLUCOPHAGE) 500 MG tablet Take 1,000 mg by mouth 2 (two) times daily with a meal.    . metoprolol succinate (TOPROL XL) 25 MG 24 hr tablet Take 1 tablet (25 mg total) by mouth daily. 90 tablet 2  . montelukast (SINGULAIR) 10 MG tablet Take 10 mg by mouth at bedtime.    . Multiple Vitamins-Minerals (MULTIVITAMIN WITH MINERALS) tablet Take 1 tablet by mouth daily.    . nitroGLYCERIN (NITROSTAT) 0.4 MG SL tablet Place 0.4 mg under the tongue every 5 (five) minutes as needed for chest pain.    . pantoprazole (PROTONIX) 40 MG tablet TAKE 1 TABLET BY MOUTH ONCE DAILY 90 tablet 2  . Probiotic Product (PROBIOTIC DAILY PO) Take 1 capsule by mouth daily.    . vedolizumab (ENTYVIO) 300 MG injection Inject 300 mg into the vein every 8 (eight) weeks. After the standard induction 1 vial 6  . Vitamin D3 (VITAMIN D) 25 MCG tablet Take 1,000 Units by mouth at bedtime.     Current Facility-Administered Medications on File Prior to Visit  Medication Dose Route Frequency Provider Last Rate Last Admin  . nitroGLYCERIN (NITROSTAT) SL tablet 0.3 mg  0.3 mg Sublingual Q5 min PRN [name] Moron, MD        Allergies  Allergen Reactions  . Lipitor [Atorvastatin] Other (See Comments)    Elevated ALT  . Remicade [Infliximab]     REACTION: SOB  . Ticagrelor     Shortness of breath  . Rosuvastatin  Other (See Comments)    Pt reports causes muscle aches and leg pains and feet to swell    Assessment/Plan:  1. Hyperlipidemia - Will check updated baseline lipid panel today. LDL goal < 55 and TG goal < 150. Will continue fenofibrate 116m daily. Discussed PCSK9i vs Nexlizet to lower her LDL. She prefers to try PCSK9i first due to positive CV outcomes data. Will submit prior authorization once baseline lipid panel results. If TG are elevated, can  consider Vascepa as well.  Trayson Stitely E. Angila Wombles, PharmD, BCACP, [place] [address] [place], [place] [address] Phone: [phone]; Fax: [phone] 09/07/2019 3:13 PM

## 2019-09-07 NOTE — Patient Instructions (Addendum)
It was nice to see you today!  Your LDL goal is < 55 and your triglyceride goal is < 150  We'll check your baseline cholesterol labs today  I'll reach out to your insurance to see if they cover Repatha injections

## 2019-09-08 LAB — HEPATIC FUNCTION PANEL
ALT: 45 IU/L — ABNORMAL HIGH (ref 0–32)
AST: 38 IU/L (ref 0–40)
Albumin: 4 g/dL (ref 3.8–4.8)
Alkaline Phosphatase: 100 IU/L (ref 39–117)
Bilirubin Total: 0.2 mg/dL (ref 0.0–1.2)
Bilirubin, Direct: 0.11 mg/dL (ref 0.00–0.40)
Total Protein: 6 g/dL (ref 6.0–8.5)

## 2019-09-08 LAB — LDL CHOLESTEROL, DIRECT: LDL Direct: 146 mg/dL — ABNORMAL HIGH (ref 0–99)

## 2019-09-08 LAB — LIPID PANEL
Chol/HDL Ratio: 6 ratio — ABNORMAL HIGH (ref 0.0–4.4)
Cholesterol, Total: 251 mg/dL — ABNORMAL HIGH (ref 100–199)
HDL: 42 mg/dL (ref >39)
LDL Chol Calc (NIH): 130 mg/dL — ABNORMAL HIGH (ref 0–99)
Triglycerides: 441 mg/dL — ABNORMAL HIGH (ref 0–149)
VLDL Cholesterol Cal: 79 mg/dL — ABNORMAL HIGH (ref 5–40)

## 2019-09-10 ENCOUNTER — Telehealth: Payer: Self-pay | Admitting: Pharmacist

## 2019-09-10 DIAGNOSIS — E785 Hyperlipidemia, unspecified: Secondary | ICD-10-CM

## 2019-09-10 MED ORDER — REPATHA SURECLICK 140 MG/ML ~~LOC~~ SOAJ: 1.0000 "pen " | SUBCUTANEOUS | 11 refills | Status: DC

## 2019-09-10 MED ORDER — VASCEPA 1 G PO CAPS: 2.0000 g | ORAL_CAPSULE | Freq: Two times a day (BID) | ORAL | 3 refills | Status: DC

## 2019-09-10 NOTE — Telephone Encounter (Signed)
LDL above goal < 55 and TG above goal < 150. ALT slightly but chronically elevated. Will start Repatha 164m Q2W and Vascepa 2g BID. Continue fenofibrate 1470mdaily. Copay cards sent to pharmacy for both ReCambridgeRecheck lipids and LFTs in 3 months. Pt is aware of plan and will call with any concerns.

## 2019-09-13 DIAGNOSIS — L905 Scar conditions and fibrosis of skin: Secondary | ICD-10-CM | POA: Diagnosis not present

## 2019-09-13 DIAGNOSIS — R208 Other disturbances of skin sensation: Secondary | ICD-10-CM | POA: Diagnosis not present

## 2019-09-13 DIAGNOSIS — L578 Other skin changes due to chronic exposure to nonionizing radiation: Secondary | ICD-10-CM | POA: Diagnosis not present

## 2019-09-13 DIAGNOSIS — Z85828 Personal history of other malignant neoplasm of skin: Secondary | ICD-10-CM | POA: Diagnosis not present

## 2019-09-13 DIAGNOSIS — L820 Inflamed seborrheic keratosis: Secondary | ICD-10-CM | POA: Diagnosis not present

## 2019-09-13 DIAGNOSIS — L570 Actinic keratosis: Secondary | ICD-10-CM | POA: Diagnosis not present

## 2019-09-20 ENCOUNTER — Telehealth: Payer: Self-pay

## 2019-09-20 ENCOUNTER — Telehealth: Payer: Self-pay | Admitting: Gastroenterology

## 2019-09-20 NOTE — Telephone Encounter (Signed)
This encounter was closed in error. New telephone encounter opened.

## 2019-09-20 NOTE — Telephone Encounter (Signed)
Patient instructed on how to purge. She will call me with follow up.

## 2019-09-20 NOTE — Telephone Encounter (Signed)
Please advise patient to do bowel purge with MiraLAX.  She is likely having overflow diarrhea with incomplete evacuation.  Please schedule office visit for follow-up soon.  Thank you

## 2019-09-20 NOTE — Telephone Encounter (Signed)
Spoke with the paitent. She reports about a week ago she became constipated. She had hard stool that was very difficult to [ass. She took Pepto-Bismol for this issue. About 3 days ago she developed liquid stool with spells of formed stools. She started having fecal leakage. She reports urinary urgency and incontinence as well. Her abdomen is bloated and "hard."  She had to stop at a gas station yesterday on her way how specifically to use the bathroom "because I wasn't going to make it." She found she had liquid stool in her panties that she was unaware of passing.

## 2019-09-20 NOTE — Telephone Encounter (Signed)
Pt stated that she is having fecal incontinence and requested a call back/

## 2019-10-01 ENCOUNTER — Telehealth: Payer: Self-pay

## 2019-10-01 ENCOUNTER — Telehealth: Payer: Self-pay | Admitting: Cardiology

## 2019-10-01 NOTE — Telephone Encounter (Signed)
Pt c/o medication issue:  1. Name of Medication:   Evolocumab (REPATHA SURECLICK) 564 MG/ML SOAJ    2. How are you currently taking this medication (dosage and times per day)? As directed  3. Are you having a reaction (difficulty breathing--STAT)? No  4. What is your medication issue? Patient states medication refill was denied by [place], [place] and prior authorization from Dr. Meda Coffee is required prior to distribution of medication. Please assist.

## 2019-10-01 NOTE — Telephone Encounter (Signed)
lmomed the pt to call us back and provide insurance

## 2019-10-01 NOTE — Telephone Encounter (Signed)
Pt calling stating that she needs a prior auth on her medication Repatha. Pt would like a call back concerning this matter. Please address

## 2019-10-01 NOTE — Telephone Encounter (Signed)
Pt called and stated that her insurance stayed the same but that they had issued new card that has new and different id bin pcn grp #'s.  ID- [id number] PCN- [id number]  [place] SUBMITTED FOR REPATHA 10/01/19

## 2019-10-01 NOTE — Telephone Encounter (Signed)
Called their pharmacy and they stated that they have the same insurance on file as we do. Will await a callback from the pt

## 2019-10-18 DIAGNOSIS — R3915 Urgency of urination: Secondary | ICD-10-CM | POA: Diagnosis not present

## 2019-10-18 DIAGNOSIS — N3946 Mixed incontinence: Secondary | ICD-10-CM | POA: Diagnosis not present

## 2019-10-18 DIAGNOSIS — R350 Frequency of micturition: Secondary | ICD-10-CM | POA: Diagnosis not present

## 2019-10-19 DIAGNOSIS — R2243 Localized swelling, mass and lump, lower limb, bilateral: Secondary | ICD-10-CM | POA: Diagnosis not present

## 2019-10-24 DIAGNOSIS — Z79899 Other long term (current) drug therapy: Secondary | ICD-10-CM | POA: Diagnosis not present

## 2019-10-24 DIAGNOSIS — Z111 Encounter for screening for respiratory tuberculosis: Secondary | ICD-10-CM | POA: Diagnosis not present

## 2019-10-24 DIAGNOSIS — K50813 Crohn's disease of both small and large intestine with fistula: Secondary | ICD-10-CM | POA: Diagnosis not present

## 2019-10-30 ENCOUNTER — Other Ambulatory Visit: Payer: Self-pay

## 2019-10-30 DIAGNOSIS — R0609 Other forms of dyspnea: Secondary | ICD-10-CM

## 2019-10-30 DIAGNOSIS — R0600 Dyspnea, unspecified: Secondary | ICD-10-CM

## 2019-10-30 DIAGNOSIS — I25119 Atherosclerotic heart disease of native coronary artery with unspecified angina pectoris: Secondary | ICD-10-CM

## 2019-10-30 DIAGNOSIS — I10 Essential (primary) hypertension: Secondary | ICD-10-CM

## 2019-10-30 DIAGNOSIS — E782 Mixed hyperlipidemia: Secondary | ICD-10-CM

## 2019-10-30 MED ORDER — METOPROLOL SUCCINATE ER 25 MG PO TB24: 25.0000 mg | ORAL_TABLET | Freq: Every day | ORAL | 1 refills | Status: DC

## 2019-11-27 ENCOUNTER — Telehealth: Payer: Self-pay | Admitting: Cardiology

## 2019-11-27 DIAGNOSIS — L03115 Cellulitis of right lower limb: Secondary | ICD-10-CM | POA: Diagnosis not present

## 2019-11-27 DIAGNOSIS — L03116 Cellulitis of left lower limb: Secondary | ICD-10-CM | POA: Diagnosis not present

## 2019-11-27 MED ORDER — FUROSEMIDE 40 MG PO TABS: 40.0000 mg | ORAL_TABLET | Freq: Every day | ORAL | 2 refills | Status: DC | PRN

## 2019-11-27 NOTE — Telephone Encounter (Signed)
New Message  Pt c/o swelling: STAT is pt has developed SOB within 24 hours  1) How much weight have you gained and in what time span? N/A, no scale  2) If swelling, where is the swelling located? Feet and ankles, stomach swells from Chron's Disease.   3) Are you currently taking a fluid pill? No  4) Are you currently SOB? Sometimes on exertion.   5) Do you have a log of your daily weights (if so, list)? n/a  6) Have you gained 3 pounds in a day or 5 pounds in a week? N/A, no scale  7) Have you traveled recently? No

## 2019-11-27 NOTE — Telephone Encounter (Signed)
We can try lasix 40 mg po daily PRN

## 2019-11-27 NOTE — Telephone Encounter (Signed)
Spoke with the pt and informed her that per Dr. Meda Coffee, we will send in for her to take lasix 40 mg po daily as needed for LEE. Confirmed the pharmacy of choice. Pt verbalized understanding and agrees with this plan.

## 2019-11-27 NOTE — Telephone Encounter (Signed)
Pt calling in to report to Dr. Meda Coffee that she has been experiencing lower extremity edema on and off for the past couple of weeks.  Pt states sometimes she gets "alitte winded." She has no other cardiac complaints at this time.  She saw her PCP at her workplace, and they advised her to wear compression stockings, and reduce her salt intake. Pt is not on any diuretic at this time. She states she tried PCP recommendations, and still has swelling.  She has had no weight gain, and her weight remains at her norm at 140 lbs.  Pt is asking for Dr. Meda Coffee to further advise on this, or a low dose diuretic.  Advised the pt to purchase herself compression stockings, and the best place to obtain these without a prescription at a low cost, with comfort, is near her home in [place], at the compression stocking outlet store.  Advised her to maintain a low sodium diet.  Advised her to monitor dry weights as needed.  Advised her to elevate her lower extremities at heart level at rest.  Informed the pt that I will route this communication to Dr. Meda Coffee to further review and advise on, and follow-up with her shortly thereafter.  Pt verbalized understanding and agrees with this plan.

## 2019-12-03 DIAGNOSIS — S80211A Abrasion, right knee, initial encounter: Secondary | ICD-10-CM | POA: Diagnosis not present

## 2019-12-03 DIAGNOSIS — S40011A Contusion of right shoulder, initial encounter: Secondary | ICD-10-CM | POA: Diagnosis not present

## 2019-12-03 DIAGNOSIS — S5001XA Contusion of right elbow, initial encounter: Secondary | ICD-10-CM | POA: Diagnosis not present

## 2019-12-07 DIAGNOSIS — M62838 Other muscle spasm: Secondary | ICD-10-CM | POA: Diagnosis not present

## 2019-12-07 DIAGNOSIS — N3946 Mixed incontinence: Secondary | ICD-10-CM | POA: Diagnosis not present

## 2019-12-07 DIAGNOSIS — M6289 Other specified disorders of muscle: Secondary | ICD-10-CM | POA: Diagnosis not present

## 2019-12-07 DIAGNOSIS — M6281 Muscle weakness (generalized): Secondary | ICD-10-CM | POA: Diagnosis not present

## 2019-12-14 ENCOUNTER — Other Ambulatory Visit: Payer: BC Managed Care – PPO | Admitting: *Deleted

## 2019-12-14 ENCOUNTER — Other Ambulatory Visit: Payer: Self-pay

## 2019-12-14 DIAGNOSIS — E785 Hyperlipidemia, unspecified: Secondary | ICD-10-CM

## 2019-12-14 LAB — HEPATIC FUNCTION PANEL
ALT: 36 IU/L — ABNORMAL HIGH (ref 0–32)
AST: 33 IU/L (ref 0–40)
Albumin: 3.9 g/dL (ref 3.8–4.8)
Alkaline Phosphatase: 88 IU/L (ref 48–121)
Bilirubin Total: 0.3 mg/dL (ref 0.0–1.2)
Bilirubin, Direct: 0.08 mg/dL (ref 0.00–0.40)
Total Protein: 6 g/dL (ref 6.0–8.5)

## 2019-12-14 LAB — LIPID PANEL
Chol/HDL Ratio: 5 ratio — ABNORMAL HIGH (ref 0.0–4.4)
Cholesterol, Total: 222 mg/dL — ABNORMAL HIGH (ref 100–199)
HDL: 44 mg/dL (ref >39)
LDL Chol Calc (NIH): 142 mg/dL — ABNORMAL HIGH (ref 0–99)
Triglycerides: 198 mg/dL — ABNORMAL HIGH (ref 0–149)
VLDL Cholesterol Cal: 36 mg/dL (ref 5–40)

## 2019-12-17 ENCOUNTER — Telehealth: Payer: Self-pay | Admitting: Pharmacist

## 2019-12-17 DIAGNOSIS — E785 Hyperlipidemia, unspecified: Secondary | ICD-10-CM

## 2019-12-17 NOTE — Telephone Encounter (Signed)
Thank you :)

## 2019-12-17 NOTE — Telephone Encounter (Signed)
Called pt to discuss lipid results. TG have improved to 198 which is the lowest they have been in the past 5 years or so. Pt is taking fenofibrate and Vascepa but reports missing AM dose of Vascepa 2-3 days per week. However, LDL has not decreased despite Repatha start (slight decrease from 146 to 142). Pt states she missed her injection that was due 2 weeks before her lab draw, but she did give an injection 1 day before the lab draw. Reviewed injection technique, sounds as though pt is administering injections appropriately into her stomach, no medication leaking out per pt reports. LFTs stable, ALT has improved slightly.  Encouraged adherence with fenofibrate, Vascepa, and Repatha. Will recheck lipids in 6 weeks. If LDL has still not changed, will try changing pt to Praluent 152m.

## 2019-12-19 DIAGNOSIS — K50813 Crohn's disease of both small and large intestine with fistula: Secondary | ICD-10-CM | POA: Diagnosis not present

## 2019-12-21 DIAGNOSIS — N3946 Mixed incontinence: Secondary | ICD-10-CM | POA: Diagnosis not present

## 2019-12-21 DIAGNOSIS — M6281 Muscle weakness (generalized): Secondary | ICD-10-CM | POA: Diagnosis not present

## 2019-12-21 DIAGNOSIS — M6289 Other specified disorders of muscle: Secondary | ICD-10-CM | POA: Diagnosis not present

## 2019-12-21 DIAGNOSIS — M62838 Other muscle spasm: Secondary | ICD-10-CM | POA: Diagnosis not present

## 2020-01-22 ENCOUNTER — Telehealth: Payer: Self-pay | Admitting: Gastroenterology

## 2020-01-22 MED ORDER — COLESTIPOL HCL 1 G PO TABS: 1.0000 g | ORAL_TABLET | Freq: Two times a day (BID) | ORAL | 2 refills | Status: DC

## 2020-01-22 NOTE — Telephone Encounter (Signed)
Refilled colestid to [facility] as requested

## 2020-01-25 ENCOUNTER — Other Ambulatory Visit: Payer: Self-pay

## 2020-01-25 ENCOUNTER — Other Ambulatory Visit: Payer: BC Managed Care – PPO | Admitting: *Deleted

## 2020-01-25 DIAGNOSIS — E785 Hyperlipidemia, unspecified: Secondary | ICD-10-CM | POA: Diagnosis not present

## 2020-01-25 LAB — LIPID PANEL
Chol/HDL Ratio: 4.4 ratio (ref 0.0–4.4)
Cholesterol, Total: 226 mg/dL — ABNORMAL HIGH (ref 100–199)
HDL: 51 mg/dL (ref >39)
LDL Chol Calc (NIH): 155 mg/dL — ABNORMAL HIGH (ref 0–99)
Triglycerides: 111 mg/dL (ref 0–149)
VLDL Cholesterol Cal: 20 mg/dL (ref 5–40)

## 2020-01-25 LAB — HEPATIC FUNCTION PANEL
ALT: 28 IU/L (ref 0–32)
AST: 26 IU/L (ref 0–40)
Albumin: 3.9 g/dL (ref 3.8–4.8)
Alkaline Phosphatase: 65 IU/L (ref 48–121)
Bilirubin Total: 0.3 mg/dL (ref 0.0–1.2)
Bilirubin, Direct: 0.1 mg/dL (ref 0.00–0.40)
Total Protein: 6 g/dL (ref 6.0–8.5)

## 2020-01-25 LAB — LDL CHOLESTEROL, DIRECT: LDL Direct: 148 mg/dL — ABNORMAL HIGH (ref 0–99)

## 2020-01-28 ENCOUNTER — Telehealth: Payer: Self-pay | Admitting: Pharmacist

## 2020-01-28 DIAGNOSIS — E785 Hyperlipidemia, unspecified: Secondary | ICD-10-CM

## 2020-01-28 NOTE — Telephone Encounter (Signed)
Spoke with patient. Since she hasnt had an injection in over a month, we do not know how well the [place] may work. She will resume injections on 9/1 (gives shot on the 1st and 15 of month). Recheck lipids on 11/1. TG wonderful at 111

## 2020-01-29 ENCOUNTER — Telehealth: Payer: Self-pay | Admitting: Physician Assistant

## 2020-01-29 MED ORDER — PANTOPRAZOLE SODIUM 40 MG PO TBEC: 40.0000 mg | DELAYED_RELEASE_TABLET | Freq: Every day | ORAL | 1 refills | Status: DC

## 2020-01-29 NOTE — Telephone Encounter (Signed)
Pt's medication was sent to pt's pharmacy as requested. Confirmation received.  °

## 2020-01-29 NOTE — Telephone Encounter (Signed)
   *  STAT* If patient is at the pharmacy, call can be transferred to refill team.   1. Which medications need to be refilled? (please list name of each medication and dose if known)   pantoprazole (PROTONIX) 40 MG tablet    2. Which pharmacy/location (including street and city if local pharmacy) is medication to be sent to? [place], [place]  3. Do they need a 30 day or 90 day supply? 90 days  Pt is out of medications for weeks now and really need it today

## 2020-02-01 DIAGNOSIS — M7541 Impingement syndrome of right shoulder: Secondary | ICD-10-CM | POA: Diagnosis not present

## 2020-02-13 DIAGNOSIS — K50813 Crohn's disease of both small and large intestine with fistula: Secondary | ICD-10-CM | POA: Diagnosis not present

## 2020-02-13 DIAGNOSIS — Z79899 Other long term (current) drug therapy: Secondary | ICD-10-CM | POA: Diagnosis not present

## 2020-02-19 ENCOUNTER — Telehealth: Payer: Self-pay | Admitting: Gastroenterology

## 2020-02-19 ENCOUNTER — Other Ambulatory Visit: Payer: Self-pay

## 2020-02-19 DIAGNOSIS — K50819 Crohn's disease of both small and large intestine with unspecified complications: Secondary | ICD-10-CM

## 2020-02-19 MED ORDER — BUDESONIDE 3 MG PO CPEP: 9.0000 mg | ORAL_CAPSULE | Freq: Every day | ORAL | 2 refills | Status: DC

## 2020-02-19 NOTE — Telephone Encounter (Signed)
Called the patient back to get the name of the pharmacy. Also scheduled her an appointment for follow up.

## 2020-03-18 DIAGNOSIS — L578 Other skin changes due to chronic exposure to nonionizing radiation: Secondary | ICD-10-CM | POA: Diagnosis not present

## 2020-03-18 DIAGNOSIS — L570 Actinic keratosis: Secondary | ICD-10-CM | POA: Diagnosis not present

## 2020-03-18 DIAGNOSIS — L718 Other rosacea: Secondary | ICD-10-CM | POA: Diagnosis not present

## 2020-03-31 ENCOUNTER — Other Ambulatory Visit: Payer: BC Managed Care – PPO

## 2020-04-01 ENCOUNTER — Telehealth: Payer: Self-pay | Admitting: Cardiology

## 2020-04-01 ENCOUNTER — Telehealth: Payer: Self-pay

## 2020-04-01 DIAGNOSIS — E785 Hyperlipidemia, unspecified: Secondary | ICD-10-CM

## 2020-04-01 NOTE — Telephone Encounter (Signed)
Called and spoke w/pt and they stated they will get them completed at a [facility] in their town

## 2020-04-01 NOTE — Addendum Note (Signed)
Addended by: Marcelle Overlie D on: 04/01/2020 09:06 AM   Modules accepted: Orders

## 2020-04-01 NOTE — Telephone Encounter (Signed)
-----   Message from Ramond Dial, [place] sent at 04/01/2020  7:39 AM EDT ----- Pt missed apt yesterday for lipids. Please reschedule ----- Message ----- From: Ramond Dial, RPH-CPP Sent: 04/01/2020 To: Megan E Supple, RPH-CPP, #  lipids

## 2020-04-01 NOTE — Telephone Encounter (Signed)
° ° °  Pt is requesting to fax lab work order to her other doctor, at [facility] clinic Dr. Laural Benes, fax# [phone]. She would like to fax it tomorrow so it will be ready for Thursday

## 2020-04-02 NOTE — Telephone Encounter (Signed)
I did call and speak with PCP office who did confirm if we fax over orders they will place orders in there system. Orders for lipid panel and LFT faxed to [phone] and [phone]

## 2020-04-03 DIAGNOSIS — E785 Hyperlipidemia, unspecified: Secondary | ICD-10-CM | POA: Diagnosis not present

## 2020-04-04 LAB — HEPATIC FUNCTION PANEL
ALT: 22 IU/L (ref 0–32)
AST: 19 IU/L (ref 0–40)
Albumin: 4 g/dL (ref 3.8–4.8)
Alkaline Phosphatase: 77 IU/L (ref 44–121)
Bilirubin Total: 0.2 mg/dL (ref 0.0–1.2)
Bilirubin, Direct: <0.1 mg/dL (ref 0.00–0.40)
Total Protein: 5.8 g/dL — ABNORMAL LOW (ref 6.0–8.5)

## 2020-04-04 LAB — LIPID PANEL
Chol/HDL Ratio: 4.9 ratio — ABNORMAL HIGH (ref 0.0–4.4)
Cholesterol, Total: 237 mg/dL — ABNORMAL HIGH (ref 100–199)
HDL: 48 mg/dL (ref >39)
LDL Chol Calc (NIH): 149 mg/dL — ABNORMAL HIGH (ref 0–99)
Triglycerides: 219 mg/dL — ABNORMAL HIGH (ref 0–149)
VLDL Cholesterol Cal: 40 mg/dL (ref 5–40)

## 2020-04-08 ENCOUNTER — Telehealth: Payer: Self-pay | Admitting: Pharmacist

## 2020-04-08 NOTE — Telephone Encounter (Signed)
Left VM for pt to call back and discuss labs. Need to see if she is taking Repatha, Vascepa and fenofibrate consistently. If she has been taking Repatha, then we need to consider switching to Praluent since she did not really respond well to [place]. Not much else we can do med wise if she is taking Vascepa and fenofibrate. I suspect some of her medications (budesonide, colestipol) could be contributing. But pt needs these meds for her UC. Best thing would be for patient to focus on a diet very low in carbs/sugars. She should ideally see a nutritionist.

## 2020-04-09 DIAGNOSIS — K50813 Crohn's disease of both small and large intestine with fistula: Secondary | ICD-10-CM | POA: Diagnosis not present

## 2020-04-11 DIAGNOSIS — M180 Bilateral primary osteoarthritis of first carpometacarpal joints: Secondary | ICD-10-CM | POA: Diagnosis not present

## 2020-04-11 DIAGNOSIS — M1812 Unilateral primary osteoarthritis of first carpometacarpal joint, left hand: Secondary | ICD-10-CM | POA: Diagnosis not present

## 2020-04-14 ENCOUNTER — Encounter: Payer: Self-pay | Admitting: *Deleted

## 2020-04-14 MED ORDER — REPATHA SURECLICK 140 MG/ML ~~LOC~~ SOAJ: 1.0000 "pen " | SUBCUTANEOUS | 3 refills | Status: DC

## 2020-04-14 NOTE — Telephone Encounter (Signed)
Patient called back. She reports that she has not been taking Repatha q 2 weeks because she forgets and her pharmacy does not automatically send it to her. She has only been taking Vascepa 2g at night because most of the time she forgets in the AM. States she is taking fenofibrate. Reviewed labs with pt. LDL still high. Pt does not know the last time she gave herself a Repatha shot. I have recommended she call pharmacy about putting Repatha on autorefill and I also sent a 90 days supply. We also discussed setting alarms to reminder her to take meds. Suggested she try an app like dosecast where you can plug in meds and then am alarm will go off when its time to take. Will recheck lipids in 3 months

## 2020-04-16 ENCOUNTER — Encounter (HOSPITAL_COMMUNITY): Payer: Self-pay

## 2020-04-16 ENCOUNTER — Emergency Department (HOSPITAL_COMMUNITY): Payer: BC Managed Care – PPO

## 2020-04-16 ENCOUNTER — Observation Stay (HOSPITAL_COMMUNITY)
Admission: EM | Admit: 2020-04-16 | Discharge: 2020-04-18 | Disposition: A | Discharge status: Home / Self Care | Payer: BC Managed Care – PPO | Attending: Family Medicine | Admitting: Family Medicine

## 2020-04-16 ENCOUNTER — Other Ambulatory Visit: Payer: Self-pay

## 2020-04-16 DIAGNOSIS — I10 Essential (primary) hypertension: Secondary | ICD-10-CM | POA: Diagnosis not present

## 2020-04-16 DIAGNOSIS — Z955 Presence of coronary angioplasty implant and graft: Secondary | ICD-10-CM | POA: Insufficient documentation

## 2020-04-16 DIAGNOSIS — Z7982 Long term (current) use of aspirin: Secondary | ICD-10-CM | POA: Insufficient documentation

## 2020-04-16 DIAGNOSIS — I5030 Unspecified diastolic (congestive) heart failure: Secondary | ICD-10-CM | POA: Diagnosis not present

## 2020-04-16 DIAGNOSIS — B9689 Other specified bacterial agents as the cause of diseases classified elsewhere: Secondary | ICD-10-CM | POA: Insufficient documentation

## 2020-04-16 DIAGNOSIS — R9389 Abnormal findings on diagnostic imaging of other specified body structures: Secondary | ICD-10-CM

## 2020-04-16 DIAGNOSIS — Z20822 Contact with and (suspected) exposure to covid-19: Secondary | ICD-10-CM | POA: Diagnosis not present

## 2020-04-16 DIAGNOSIS — I110 Hypertensive heart disease with heart failure: Secondary | ICD-10-CM | POA: Insufficient documentation

## 2020-04-16 DIAGNOSIS — N179 Acute kidney failure, unspecified: Secondary | ICD-10-CM | POA: Diagnosis not present

## 2020-04-16 DIAGNOSIS — E1165 Type 2 diabetes mellitus with hyperglycemia: Secondary | ICD-10-CM | POA: Diagnosis not present

## 2020-04-16 DIAGNOSIS — Z95820 Peripheral vascular angioplasty status with implants and grafts: Secondary | ICD-10-CM

## 2020-04-16 DIAGNOSIS — K5090 Crohn's disease, unspecified, without complications: Secondary | ICD-10-CM | POA: Diagnosis present

## 2020-04-16 DIAGNOSIS — E119 Type 2 diabetes mellitus without complications: Secondary | ICD-10-CM | POA: Insufficient documentation

## 2020-04-16 DIAGNOSIS — N390 Urinary tract infection, site not specified: Secondary | ICD-10-CM | POA: Diagnosis not present

## 2020-04-16 DIAGNOSIS — G9341 Metabolic encephalopathy: Secondary | ICD-10-CM | POA: Diagnosis not present

## 2020-04-16 DIAGNOSIS — Z79899 Other long term (current) drug therapy: Secondary | ICD-10-CM | POA: Diagnosis not present

## 2020-04-16 DIAGNOSIS — J189 Pneumonia, unspecified organism: Secondary | ICD-10-CM | POA: Diagnosis not present

## 2020-04-16 DIAGNOSIS — Z7984 Long term (current) use of oral hypoglycemic drugs: Secondary | ICD-10-CM | POA: Diagnosis not present

## 2020-04-16 DIAGNOSIS — I4581 Long QT syndrome: Secondary | ICD-10-CM | POA: Diagnosis not present

## 2020-04-16 DIAGNOSIS — I2510 Atherosclerotic heart disease of native coronary artery without angina pectoris: Secondary | ICD-10-CM | POA: Diagnosis present

## 2020-04-16 DIAGNOSIS — R5383 Other fatigue: Secondary | ICD-10-CM | POA: Diagnosis not present

## 2020-04-16 DIAGNOSIS — R4182 Altered mental status, unspecified: Secondary | ICD-10-CM | POA: Diagnosis not present

## 2020-04-16 DIAGNOSIS — E876 Hypokalemia: Secondary | ICD-10-CM | POA: Diagnosis not present

## 2020-04-16 DIAGNOSIS — R112 Nausea with vomiting, unspecified: Secondary | ICD-10-CM | POA: Diagnosis present

## 2020-04-16 DIAGNOSIS — G9340 Encephalopathy, unspecified: Secondary | ICD-10-CM | POA: Diagnosis present

## 2020-04-16 DIAGNOSIS — R9431 Abnormal electrocardiogram [ECG] [EKG]: Secondary | ICD-10-CM

## 2020-04-16 DIAGNOSIS — Z87891 Personal history of nicotine dependence: Secondary | ICD-10-CM | POA: Insufficient documentation

## 2020-04-16 MED ORDER — NALOXONE HCL 2 MG/2ML IJ SOSY
1.0000 mg | PREFILLED_SYRINGE | INTRAMUSCULAR | Status: DC | PRN
  Administered 2020-04-17: 1 mg via INTRAVENOUS
  Filled 2020-04-16 (×2): qty 2

## 2020-04-16 NOTE — ED Triage Notes (Signed)
Husband states that he came home from work and she was asleep and she didn't go to work, not in any pain just wants to sleep, he tried to get her to walk around and her cbg was normal at home

## 2020-04-16 NOTE — ED Triage Notes (Signed)
Pt states that she's not taking anything to make her sleep, she can't eat or drink because all she wants to do is sleep, she states that she's not depressed she just doesn't have any energy

## 2020-04-17 ENCOUNTER — Observation Stay (HOSPITAL_COMMUNITY): Payer: BC Managed Care – PPO

## 2020-04-17 ENCOUNTER — Encounter (HOSPITAL_COMMUNITY): Payer: Self-pay | Admitting: Emergency Medicine

## 2020-04-17 DIAGNOSIS — G9340 Encephalopathy, unspecified: Secondary | ICD-10-CM | POA: Diagnosis not present

## 2020-04-17 DIAGNOSIS — R519 Headache, unspecified: Secondary | ICD-10-CM | POA: Diagnosis not present

## 2020-04-17 DIAGNOSIS — R112 Nausea with vomiting, unspecified: Secondary | ICD-10-CM | POA: Diagnosis not present

## 2020-04-17 DIAGNOSIS — J189 Pneumonia, unspecified organism: Secondary | ICD-10-CM

## 2020-04-17 DIAGNOSIS — E119 Type 2 diabetes mellitus without complications: Secondary | ICD-10-CM | POA: Diagnosis not present

## 2020-04-17 DIAGNOSIS — R5383 Other fatigue: Secondary | ICD-10-CM | POA: Diagnosis not present

## 2020-04-17 DIAGNOSIS — R4182 Altered mental status, unspecified: Secondary | ICD-10-CM | POA: Diagnosis not present

## 2020-04-17 DIAGNOSIS — I10 Essential (primary) hypertension: Secondary | ICD-10-CM | POA: Diagnosis not present

## 2020-04-17 LAB — CBC WITH DIFFERENTIAL/PLATELET
Abs Immature Granulocytes: 0.12 10*3/uL — ABNORMAL HIGH (ref 0.00–0.07)
Abs Immature Granulocytes: 0.21 10*3/uL — ABNORMAL HIGH (ref 0.00–0.07)
Basophils Absolute: 0 10*3/uL (ref 0.0–0.1)
Basophils Absolute: 0.1 10*3/uL (ref 0.0–0.1)
Basophils Relative: 1 %
Basophils Relative: 1 %
Eosinophils Absolute: 0 10*3/uL (ref 0.0–0.5)
Eosinophils Absolute: 0 10*3/uL (ref 0.0–0.5)
Eosinophils Relative: 0 %
Eosinophils Relative: 0 %
HCT: 37.6 % (ref 36.0–46.0)
HCT: 45.4 % (ref 36.0–46.0)
Hemoglobin: 12.3 g/dL (ref 12.0–15.0)
Hemoglobin: 14.7 g/dL (ref 12.0–15.0)
Immature Granulocytes: 2 %
Immature Granulocytes: 2 %
Lymphocytes Relative: 16 %
Lymphocytes Relative: 20 %
Lymphs Abs: 1.6 10*3/uL (ref 0.7–4.0)
Lymphs Abs: 2.1 10*3/uL (ref 0.7–4.0)
MCH: 29.7 pg (ref 26.0–34.0)
MCH: 29.7 pg (ref 26.0–34.0)
MCHC: 32.4 g/dL (ref 30.0–36.0)
MCHC: 32.7 g/dL (ref 30.0–36.0)
MCV: 90.8 fL (ref 80.0–100.0)
MCV: 91.7 fL (ref 80.0–100.0)
Monocytes Absolute: 1 10*3/uL (ref 0.1–1.0)
Monocytes Absolute: 1.6 10*3/uL — ABNORMAL HIGH (ref 0.1–1.0)
Monocytes Relative: 12 %
Monocytes Relative: 12 %
Neutro Abs: 5.3 10*3/uL (ref 1.7–7.7)
Neutro Abs: 9.2 10*3/uL — ABNORMAL HIGH (ref 1.7–7.7)
Neutrophils Relative %: 65 %
Neutrophils Relative %: 69 %
Platelets: 237 10*3/uL (ref 150–400)
Platelets: 281 10*3/uL (ref 150–400)
RBC: 4.14 MIL/uL (ref 3.87–5.11)
RBC: 4.95 MIL/uL (ref 3.87–5.11)
RDW: 14.8 % (ref 11.5–15.5)
RDW: 14.8 % (ref 11.5–15.5)
WBC: 13.2 10*3/uL — ABNORMAL HIGH (ref 4.0–10.5)
WBC: 8.1 10*3/uL (ref 4.0–10.5)
nRBC: 0 % (ref 0.0–0.2)
nRBC: 0 % (ref 0.0–0.2)

## 2020-04-17 LAB — URINALYSIS, ROUTINE W REFLEX MICROSCOPIC
Bilirubin Urine: NEGATIVE
Glucose, UA: 50 mg/dL — AB
Hgb urine dipstick: NEGATIVE
Ketones, ur: 5 mg/dL — AB
Nitrite: NEGATIVE
Protein, ur: 100 mg/dL — AB
Specific Gravity, Urine: 1.021 (ref 1.005–1.030)
WBC, UA: >50 WBC/hpf — ABNORMAL HIGH (ref 0–5)
pH: 5 (ref 5.0–8.0)

## 2020-04-17 LAB — COMPREHENSIVE METABOLIC PANEL
ALT: 27 U/L (ref 0–44)
AST: 25 U/L (ref 15–41)
Albumin: 3.3 g/dL — ABNORMAL LOW (ref 3.5–5.0)
Alkaline Phosphatase: 51 U/L (ref 38–126)
Anion gap: 13 (ref 5–15)
BUN: 17 mg/dL (ref 8–23)
CO2: 24 mmol/L (ref 22–32)
Calcium: 7.5 mg/dL — ABNORMAL LOW (ref 8.9–10.3)
Chloride: 98 mmol/L (ref 98–111)
Creatinine, Ser: 0.83 mg/dL (ref 0.44–1.00)
GFR, Estimated: >60 mL/min (ref >60)
Glucose, Bld: 182 mg/dL — ABNORMAL HIGH (ref 70–99)
Potassium: 2.9 mmol/L — ABNORMAL LOW (ref 3.5–5.1)
Sodium: 135 mmol/L (ref 135–145)
Total Bilirubin: 1 mg/dL (ref 0.3–1.2)
Total Protein: 5.9 g/dL — ABNORMAL LOW (ref 6.5–8.1)

## 2020-04-17 LAB — BLOOD GAS, ARTERIAL
Acid-Base Excess: 3.8 mmol/L — ABNORMAL HIGH (ref 0.0–2.0)
Bicarbonate: 27.6 mmol/L (ref 20.0–28.0)
FIO2: 21
O2 Saturation: 92 %
Patient temperature: 101.8
pCO2 arterial: 43.9 mmHg (ref 32.0–48.0)
pH, Arterial: 7.423 (ref 7.350–7.450)
pO2, Arterial: 73.8 mmHg — ABNORMAL LOW (ref 83.0–108.0)

## 2020-04-17 LAB — GLUCOSE, CAPILLARY
Glucose-Capillary: 175 mg/dL — ABNORMAL HIGH (ref 70–99)
Glucose-Capillary: 164 mg/dL — ABNORMAL HIGH (ref 70–99)
Glucose-Capillary: 96 mg/dL (ref 70–99)
Glucose-Capillary: 143 mg/dL — ABNORMAL HIGH (ref 70–99)

## 2020-04-17 LAB — COOXEMETRY PANEL
Carboxyhemoglobin: 0.9 % (ref 0.5–1.5)
Methemoglobin: 1.3 % (ref 0.0–1.5)
O2 Saturation: 91.4 %
Total hemoglobin: 12.7 g/dL (ref 12.0–16.0)

## 2020-04-17 LAB — I-STAT CHEM 8, ED
BUN: 22 mg/dL (ref 8–23)
Calcium, Ion: 0.99 mmol/L — ABNORMAL LOW (ref 1.15–1.40)
Chloride: 94 mmol/L — ABNORMAL LOW (ref 98–111)
Creatinine, Ser: 1.2 mg/dL — ABNORMAL HIGH (ref 0.44–1.00)
Glucose, Bld: 228 mg/dL — ABNORMAL HIGH (ref 70–99)
HCT: 47 % — ABNORMAL HIGH (ref 36.0–46.0)
Hemoglobin: 16 g/dL — ABNORMAL HIGH (ref 12.0–15.0)
Potassium: 3.4 mmol/L — ABNORMAL LOW (ref 3.5–5.1)
Sodium: 135 mmol/L (ref 135–145)
TCO2: 24 mmol/L (ref 22–32)

## 2020-04-17 LAB — RAPID URINE DRUG SCREEN, HOSP PERFORMED
Amphetamines: NOT DETECTED
Barbiturates: NOT DETECTED
Benzodiazepines: NOT DETECTED
Cocaine: NOT DETECTED
Opiates: NOT DETECTED
Tetrahydrocannabinol: NOT DETECTED

## 2020-04-17 LAB — HIV ANTIBODY (ROUTINE TESTING W REFLEX): HIV Screen 4th Generation wRfx: NONREACTIVE

## 2020-04-17 LAB — TROPONIN I (HIGH SENSITIVITY)
Troponin I (High Sensitivity): 4 ng/L (ref <18)
Troponin I (High Sensitivity): 5 ng/L (ref <18)

## 2020-04-17 LAB — RESPIRATORY PANEL BY RT PCR (FLU A&B, COVID)
Influenza A by PCR: NEGATIVE
Influenza B by PCR: NEGATIVE
SARS Coronavirus 2 by RT PCR: NEGATIVE

## 2020-04-17 LAB — AMMONIA: Ammonia: 26 umol/L (ref 9–35)

## 2020-04-17 LAB — ETHANOL: Alcohol, Ethyl (B): <10 mg/dL (ref <10)

## 2020-04-17 MED ORDER — ASPIRIN 81 MG PO CHEW
81.0000 mg | CHEWABLE_TABLET | Freq: Every day | ORAL | Status: DC
  Administered 2020-04-17 – 2020-04-18 (×2): 81 mg via ORAL
  Filled 2020-04-17 (×2): qty 1

## 2020-04-17 MED ORDER — BUSPIRONE HCL 5 MG PO TABS
10.0000 mg | ORAL_TABLET | Freq: Two times a day (BID) | ORAL | Status: DC
  Administered 2020-04-17 – 2020-04-18 (×2): 10 mg via ORAL
  Filled 2020-04-17 (×2): qty 2

## 2020-04-17 MED ORDER — NITROGLYCERIN 0.4 MG SL SUBL: 0.4000 mg | SUBLINGUAL_TABLET | SUBLINGUAL | Status: DC | PRN

## 2020-04-17 MED ORDER — METOPROLOL SUCCINATE ER 25 MG PO TB24
25.0000 mg | ORAL_TABLET | Freq: Every day | ORAL | Status: DC
  Administered 2020-04-17 – 2020-04-18 (×2): 25 mg via ORAL
  Filled 2020-04-17 (×2): qty 1

## 2020-04-17 MED ORDER — SODIUM CHLORIDE 0.9 % IV SOLN: INTRAVENOUS | Status: DC

## 2020-04-17 MED ORDER — SODIUM CHLORIDE 0.9 % IV SOLN
1.0000 g | INTRAVENOUS | Status: DC
  Administered 2020-04-17: 1 g via INTRAVENOUS
  Filled 2020-04-17 (×2): qty 10

## 2020-04-17 MED ORDER — INSULIN ASPART 100 UNIT/ML ~~LOC~~ SOLN
0.0000 [IU] | Freq: Three times a day (TID) | SUBCUTANEOUS | Status: DC
  Administered 2020-04-17 – 2020-04-18 (×2): 2 [IU] via SUBCUTANEOUS

## 2020-04-17 MED ORDER — FLUTICASONE PROPIONATE 50 MCG/ACT NA SUSP
2.0000 | Freq: Every day | NASAL | Status: DC
  Administered 2020-04-17 – 2020-04-18 (×2): 2 via NASAL
  Filled 2020-04-17: qty 16

## 2020-04-17 MED ORDER — ENOXAPARIN SODIUM 40 MG/0.4ML ~~LOC~~ SOLN
40.0000 mg | SUBCUTANEOUS | Status: DC
  Administered 2020-04-17 – 2020-04-18 (×2): 40 mg via SUBCUTANEOUS
  Filled 2020-04-17 (×2): qty 0.4

## 2020-04-17 MED ORDER — SODIUM CHLORIDE 0.9 % IV SOLN
1.0000 g | Freq: Once | INTRAVENOUS | Status: AC
  Administered 2020-04-17: 1 g via INTRAVENOUS
  Filled 2020-04-17: qty 10

## 2020-04-17 MED ORDER — PANTOPRAZOLE SODIUM 40 MG PO TBEC
40.0000 mg | DELAYED_RELEASE_TABLET | Freq: Every day | ORAL | Status: DC
  Administered 2020-04-17 – 2020-04-18 (×2): 40 mg via ORAL
  Filled 2020-04-17 (×2): qty 1

## 2020-04-17 MED ORDER — BUDESONIDE 3 MG PO CPEP
9.0000 mg | ORAL_CAPSULE | Freq: Every day | ORAL | Status: DC
  Filled 2020-04-17: qty 3

## 2020-04-17 MED ORDER — DULOXETINE HCL 60 MG PO CPEP
60.0000 mg | ORAL_CAPSULE | Freq: Every day | ORAL | Status: DC
  Administered 2020-04-17 – 2020-04-18 (×2): 60 mg via ORAL
  Filled 2020-04-17 (×2): qty 1

## 2020-04-17 MED ORDER — SODIUM BICARBONATE 8.4 % IV SOLN
50.0000 meq | Freq: Once | INTRAVENOUS | Status: AC
  Administered 2020-04-17: 50 meq via INTRAVENOUS
  Filled 2020-04-17: qty 50

## 2020-04-17 MED ORDER — POTASSIUM CHLORIDE 10 MEQ/100ML IV SOLN
10.0000 meq | Freq: Once | INTRAVENOUS | Status: AC
  Administered 2020-04-17: 10 meq via INTRAVENOUS
  Filled 2020-04-17: qty 100

## 2020-04-17 MED ORDER — ACETAMINOPHEN 325 MG PO TABS
650.0000 mg | ORAL_TABLET | Freq: Four times a day (QID) | ORAL | Status: DC | PRN
  Administered 2020-04-17 – 2020-04-18 (×4): 650 mg via ORAL
  Filled 2020-04-17 (×4): qty 2

## 2020-04-17 MED ORDER — POTASSIUM CHLORIDE CRYS ER 20 MEQ PO TBCR
40.0000 meq | EXTENDED_RELEASE_TABLET | ORAL | Status: AC
  Administered 2020-04-17 (×2): 40 meq via ORAL
  Filled 2020-04-17 (×2): qty 2

## 2020-04-17 MED ORDER — DULOXETINE HCL 60 MG PO CPEP: 60.0000 mg | ORAL_CAPSULE | Freq: Every day | ORAL | Status: DC

## 2020-04-17 MED ORDER — BUSPIRONE HCL 10 MG PO TABS
10.0000 mg | ORAL_TABLET | Freq: Two times a day (BID) | ORAL | Status: DC
  Filled 2020-04-17: qty 1

## 2020-04-17 MED ORDER — FENOFIBRATE 160 MG PO TABS
160.0000 mg | ORAL_TABLET | Freq: Every day | ORAL | Status: DC
  Administered 2020-04-17 – 2020-04-18 (×2): 160 mg via ORAL
  Filled 2020-04-17 (×2): qty 1

## 2020-04-17 MED ORDER — ACETAMINOPHEN 650 MG RE SUPP: 650.0000 mg | Freq: Four times a day (QID) | RECTAL | Status: DC | PRN

## 2020-04-17 MED ORDER — SUMATRIPTAN SUCCINATE 6 MG/0.5ML ~~LOC~~ SOLN
6.0000 mg | SUBCUTANEOUS | Status: AC | PRN
  Administered 2020-04-17 – 2020-04-18 (×2): 6 mg via SUBCUTANEOUS
  Filled 2020-04-17 (×3): qty 0.5

## 2020-04-17 MED ORDER — BUDESONIDE 3 MG PO CPEP
9.0000 mg | ORAL_CAPSULE | Freq: Every day | ORAL | Status: DC
  Administered 2020-04-17 – 2020-04-18 (×2): 9 mg via ORAL
  Filled 2020-04-17 (×2): qty 3

## 2020-04-17 NOTE — Plan of Care (Signed)
  Problem: Activity: Goal: Ability to tolerate increased activity will improve Description: Patient will improve being able to tolerate increased activity by 04/20/20 Outcome: Progressing

## 2020-04-17 NOTE — Progress Notes (Signed)
PROGRESS NOTE    [name]  OYD:[id number] DOB: 03-10-1961 DOA: 04/16/2020 PCP: [facility]   No chief complaint on file.  Brief Narrative:  [name] is [name] 59 y.o. female with history of Crohn's disease, diabetes mellitus type 2, CAD status post stenting was found to be increasingly lethargic since yesterday morning.  Patient has not gone to the office yesterday.  Patient has been came back later in the evening and found patient was very lethargic at home and was brought to the ER.  Has not had any new changes in medications denies any headache chest pain shortness of breath.  Did not have any nausea vomiting until patient came to the ER.  Did not have any fever or chills.  ED Course: In the ER patient was afebrile appeared nonfocal.  ABG did not show any carbon dioxide retention or any elevated carbon monoxide levels.  Labs are significant for possible UTI and mild renal failure.  Patient was started on fluids ceftriaxone admitted for further observation.  CT head and CT renal studies are pending.  Assessment & Plan:   Active Problems:   Essential hypertension   Crohn's disease ([place])   Coronary artery disease involving native coronary artery of native heart without angina pectoris   Diabetes mellitus type 2, uncomplicated (HCC)   S/P angioplasty with stent 04/18/17 with DES to pLAD   Acute encephalopathy   Nausea & vomiting  1. Acute Metabolic Encephalopathy 1. Likely 2/2 UTI, follow with abx below 2. Negative head CT, normal ammonia, negative etoh 3. Negative utox  4. She's improving on abx, continue to monitor  2. Possible UTI on ceftriaxone follow urine cultures. 1. UA with few bacteria, +RBC's, >50 WBS 2. Urine culture not done, will try to add on 3. Continue ceftriaxone 4. CXR 11/18 with mild L atelectasis or scarring - repeat CXR  5. CT abdomen/pelvis, without acute findings  3. Hypokalemia: replace and follow  4. Acute renal failure improved, continue to  monitor  5. Nausea vomiting - resolved  6. CAD status post stenting denies any chest pain on aspirin metoprolol.  7. History of Crohn's disease abdomen appears benign.  Since patient has nausea vomiting CT abdomen has been ordered.  8. Diabetes mellitus type 2 with hyperglycemia sliding scale coverage. 1. Takes 64 units of lantus at home? Not too high, will ask nurse to check when last dose  DVT prophylaxis: lovenox Code Status: full  Family Communication: husband at bedside Disposition:   Status is: Observation  The patient remains OBS appropriate and will d/c before 2 midnights.  Dispo: The patient is from: Home              Anticipated d/c is to: Home              Anticipated d/c date is: 1 day              Patient currently is not medically stable to d/c.       Consultants:   none  Procedures:   none  Antimicrobials:  Anti-infectives (From admission, onward)   Start     Dose/Rate Route Frequency Ordered Stop   04/17/20 2200  cefTRIAXone (ROCEPHIN) 1 g in sodium chloride 0.9 % 100 mL IVPB        1 g 200 mL/hr over 30 Minutes Intravenous Every 24 hours 04/17/20 0323     04/17/20 0130  cefTRIAXone (ROCEPHIN) 1 g in sodium chloride 0.9 % 100 mL IVPB  1 g 200 mL/hr over 30 Minutes Intravenous  Once 04/17/20 0124 04/17/20 0309      Subjective: No new complaints Feels better overall  Objective: Vitals:   04/17/20 0200 04/17/20 0300 04/17/20 0402 04/17/20 0653  BP: (!) 111/42 (!) 132/52 (!) 128/44 (!) 131/57  Pulse: 100 (!) 102 97 93  Resp: 18 20 18 16   Temp:   99.6 F (37.6 C) 99.4 F (37.4 C)  TempSrc:   Oral Oral  SpO2: 95% 95% 92% 92%  Weight:   63.6 kg   Height:   5' 3"  (1.6 m)     Intake/Output Summary (Last 24 hours) at 04/17/2020 1729 Last data filed at 04/17/2020 5277 Gross per 24 hour  Intake 788.49 ml  Output --  Net 788.49 ml   Filed Weights   04/16/20 2221 04/17/20 0402  Weight: 68 kg 63.6 kg    Examination:  General  exam: Appears calm and comfortable  Respiratory system: Clear to auscultation. Respiratory effort normal. Cardiovascular system: S1 & S2 heard, RRR.  Gastrointestinal system: Abdomen is nondistended, soft and nontender. . Central nervous system: Alert and oriented. No focal neurological deficits. Extremities: no LEE Skin: No rashes, lesions or ulcers Psychiatry: Judgement and insight appear normal. Mood & affect appropriate.     Data Reviewed: I have personally reviewed following labs and imaging studies  CBC: Recent Labs  Lab 04/16/20 2345 04/17/20 0028 04/17/20 0550  WBC 13.2*  --  8.1  NEUTROABS 9.2*  --  5.3  HGB 14.7 16.0* 12.3  HCT 45.4 47.0* 37.6  MCV 91.7  --  90.8  PLT 281  --  824    Basic Metabolic Panel: Recent Labs  Lab 04/17/20 0028 04/17/20 0550  NA 135 135  K 3.4* 2.9*  CL 94* 98  CO2  --  24  GLUCOSE 228* 182*  BUN 22 17  CREATININE 1.20* 0.83  CALCIUM  --  7.5*    GFR: Estimated Creatinine Clearance: 63.9 mL/min (by C-G formula based on SCr of 0.83 mg/dL).  Liver Function Tests: Recent Labs  Lab 04/17/20 0550  AST 25  ALT 27  ALKPHOS 51  BILITOT 1.0  PROT 5.9*  ALBUMIN 3.3*    CBG: Recent Labs  Lab 04/17/20 0743 04/17/20 1211  GLUCAP 175* 164*     Recent Results (from the past 240 hour(s))  Respiratory Panel by RT PCR (Flu [name]&B, Covid) - Nasopharyngeal Swab     Status: None   Collection Time: 04/17/20  4:00 AM   Specimen: Nasopharyngeal Swab  Result Value Ref Range Status   SARS Coronavirus 2 by RT PCR NEGATIVE NEGATIVE Final    Comment: (NOTE) SARS-CoV-2 target nucleic acids are NOT DETECTED.  The SARS-CoV-2 RNA is generally detectable in upper respiratoy specimens during the acute phase of infection. The lowest concentration of SARS-CoV-2 viral copies this assay can detect is 131 copies/mL. [name] negative result does not preclude SARS-Cov-2 infection and should not be used as the sole basis for treatment or other patient  management decisions. [name] negative result may occur with  improper specimen collection/handling, submission of specimen other than nasopharyngeal swab, presence of viral mutation(s) within the areas targeted by this assay, and inadequate number of viral copies (<131 copies/mL). Emin Foree negative result must be combined with clinical observations, patient history, and epidemiological information. The expected result is Negative.  Fact Sheet for Patients:  PinkCheek.be  Fact Sheet for Healthcare Providers:  GravelBags.it  This test is no t yet approved or  cleared by the [place] and  has been authorized for detection and/or diagnosis of SARS-CoV-2 by FDA under an Emergency Use Authorization (EUA). This EUA will remain  in effect (meaning this test can be used) for the duration of the COVID-19 declaration under Section 564(b)(1) of the Act, 21 U.S.C. section 360bbb-3(b)(1), unless the authorization is terminated or revoked sooner.     Influenza [place] by PCR NEGATIVE NEGATIVE Final   Influenza B by PCR NEGATIVE NEGATIVE Final    Comment: (NOTE) The Xpert Xpress SARS-CoV-2/FLU/RSV assay is intended as an aid in  the diagnosis of influenza from Nasopharyngeal swab specimens and  should not be used as Luetta Piazza sole basis for treatment. Nasal washings and  aspirates are unacceptable for Xpert Xpress SARS-CoV-2/FLU/RSV  testing.  Fact Sheet for Patients: PinkCheek.be  Fact Sheet for Healthcare Providers: GravelBags.it  This test is not yet approved or cleared by the [place] FDA and  has been authorized for detection and/or diagnosis of SARS-CoV-2 by  FDA under an Emergency Use Authorization (EUA). This EUA will remain  in effect (meaning this test can be used) for the duration of the  Covid-19 declaration under Section 564(b)(1) of the Act, 21  U.S.C. section 360bbb-3(b)(1),  unless the authorization is  terminated or revoked. Performed at [facility] [place], [place] [address] [place], [place] [address]          Radiology Studies: CT Head Wo Contrast  Result Date: 04/17/2020 CLINICAL DATA:  Delirium.  Headache and weakness. EXAM: CT HEAD WITHOUT CONTRAST TECHNIQUE: Contiguous axial images were obtained from the base of the skull through the vertex without intravenous contrast. COMPARISON:  None. FINDINGS: Brain: No evidence of acute infarction, hemorrhage, hydrocephalus, extra-axial collection or mass lesion/mass effect. Vascular: No hyperdense vessel or unexpected calcification. Skull: Normal. Negative for fracture or focal lesion. Sinuses/Orbits: No acute finding. Other: None. IMPRESSION: Negative head CT. Electronically Signed   By: Markus Daft M.D.   On: 04/17/2020 10:58   DG Chest Portable 1 View  Result Date: 04/17/2020 CLINICAL DATA:  Initial evaluation for acute fatigue. EXAM: PORTABLE CHEST 1 VIEW COMPARISON:  Prior radiograph from 08/01/2019. FINDINGS: Transverse heart size stable, and remains within normal limits. Mediastinal silhouette within normal limits. Aortic atherosclerosis. Lungs are hypoinflated. Linear left basilar atelectasis and/or scarring. No focal infiltrates. No edema or effusion. No pneumothorax. No acute osseous finding. IMPRESSION: 1. Shallow lung inflation with mild left basilar atelectasis and/or scarring. 2. No other active cardiopulmonary disease. 3.  Aortic Atherosclerosis (ICD10-I70.0). Electronically Signed   By: Jeannine Boga M.D.   On: 04/17/2020 01:48   CT RENAL STONE STUDY  Result Date: 04/17/2020 CLINICAL DATA:  Flank pain, kidney stone suspected. EXAM: CT ABDOMEN AND PELVIS WITHOUT CONTRAST TECHNIQUE: Multidetector CT imaging of the abdomen and pelvis was performed following the standard protocol without IV contrast. COMPARISON:  Is MRI pelvis February 14, 2018 and CT abdomen pelvis March 19, 2014  FINDINGS: Lower chest: Bibasilar atelectasis or scarring. Hepatobiliary: Unremarkable noncontrast appearance of liver. Cholecystectomy. No biliary ductal dilatation. Pancreas: Unremarkable appearance of the pancreas without evidence of peripancreatic inflammation. Spleen: Normal in size without focal abnormality. Adrenals/Urinary Tract: Adrenal glands are unremarkable. Kidneys are normal, without renal calculi, focal lesion, or hydronephrosis. Bladder is decompressed limiting evaluation. Stomach/Bowel: Stomach is within normal limits. Appendix appears normal. No evidence of bowel wall thickening, distention, or inflammatory changes. Vascular/Lymphatic: Aortic atherosclerosis. No enlarged abdominal or pelvic lymph nodes. Reproductive: Uterus and bilateral adnexa are unremarkable. Other:  No abdominal wall hernia or abnormality. No abdominopelvic ascites. Musculoskeletal: Thoracolumbar spondylosis. No acute osseous abnormality. IMPRESSION: 1. No acute abdominopelvic findings. Specifically, no evidence of nephrolithiasis or obstructive uropathy. 2. Aortic atherosclerosis. Aortic Atherosclerosis (ICD10-I70.0). Electronically Signed   By: Dahlia Bailiff MD   On: 04/17/2020 10:52        Scheduled Meds: . enoxaparin (LOVENOX) injection  40 mg Subcutaneous Q24H  . insulin aspart  0-9 Units Subcutaneous TID WC  . metoprolol succinate  25 mg Oral Daily   Continuous Infusions: . sodium chloride 125 mL/hr at 04/17/20 1308  . cefTRIAXone (ROCEPHIN)  IV       LOS: 0 days    Time spent: over 30 min    Fayrene Helper, MD [facility]   To contact the attending provider between 7A-7P or the covering provider during after hours 7P-7A, please log into the web site [email or web address] and access using universal  password for that web site. If you do not have the password, please call the hospital operator.  04/17/2020, 5:29 PM

## 2020-04-17 NOTE — H&P (Signed)
History and Physical    [name] LDJ:[id number] DOB: 1960-06-10 DOA: 04/16/2020  PCP: [facility]  Patient coming from: Home.  History obtained from patient and patient's husband and previous records.  Chief Complaint: Lethargy.  HPI: [name] is a 59 y.o. female with history of Crohn's disease, diabetes mellitus type 2, CAD status post stenting was found to be increasingly lethargic since yesterday morning.  Patient has not gone to the office yesterday.  Patient has been came back later in the evening and found patient was very lethargic at home and was brought to the ER.  Has not had any new changes in medications denies any headache chest pain shortness of breath.  Did not have any nausea vomiting until patient came to the ER.  Did not have any fever or chills.  ED Course: In the ER patient was afebrile appeared nonfocal.  ABG did not show any carbon dioxide retention or any elevated carbon monoxide levels.  Labs are significant for possible UTI and mild renal failure.  Patient was started on fluids ceftriaxone admitted for further observation.  CT head and CT renal studies are pending.  Review of Systems: As per HPI, rest all negative.   Past Medical History:  Diagnosis Date  . Allergic rhinitis due to pollen   . Allergy   . Anxiety state, unspecified   . Arthritis   . Bronchitis    hx  . CAD (coronary artery disease)    s/p NSTEMI in 11/18 treated with a DES to the proximal LAD  . Calculus of gallbladder without mention of cholecystitis or obstruction   . Crohn disease ([place])   . Depressive disorder, not elsewhere classified   . Diabetes mellitus without complication (HCC)    diet controlled  . Esophageal reflux   . Fibromyalgia   . Grade I diastolic dysfunction    Noted on ECHO  . Headache   . History of nuclear stress test    ETT-Myoview 10/17: EF 55%, normal perfusion; Low Risk // Nuclear stress test 05/2018:  EF 63, normal perfusion; Low Risk  . Myalgia  and myositis, unspecified   . NSTEMI (non-ST elevated myocardial infarction) ([place]) 04/15/2017  . Other and unspecified hyperlipidemia   . Palpitations   . Pneumonia 13   hx  . S/P angioplasty with stent 04/18/17 with DES to pLAD 04/19/2017  . Seizures ([place])    1 seizure as a teenager   . Unspecified essential hypertension   . Vitamin B deficiency     Past Surgical History:  Procedure Laterality Date  . BIOPSY  12/05/2018   Procedure: BIOPSY;  Surgeon: Mauri Pole, MD;  Location: [facility];  Service: Endoscopy;;  . CHOLECYSTECTOMY N/A 09/19/2014   Procedure: LAPAROSCOPIC CHOLECYSTECTOMY WITH INTRAOPERATIVE CHOLANGIOGRAM;  Surgeon: Donnie Mesa, MD;  Location: [facility];  Service: General;  Laterality: N/A;  . COLONOSCOPY WITH PROPOFOL N/A 12/05/2018   Procedure: COLONOSCOPY WITH PROPOFOL;  Surgeon: Mauri Pole, MD;  Location: [facility];  Service: Endoscopy;  Laterality: N/A;  chromoendoscopy  . CORONARY STENT INTERVENTION N/A 04/18/2017   Procedure: CORONARY STENT INTERVENTION;  Surgeon: Martinique, Peter M, MD;  Location: [facility];  Service: Cardiovascular;  Laterality: N/A;  . ESOPHAGOGASTRODUODENOSCOPY    . EXPLORATORY LAPAROTOMY     for endometriosis  . INTRAVASCULAR PRESSURE WIRE/FFR STUDY N/A 04/18/2017   Procedure: INTRAVASCULAR PRESSURE WIRE/FFR STUDY;  Surgeon: Martinique, Peter M, MD;  Location: [facility];  Service: Cardiovascular;  Laterality: N/A;  . LEFT HEART CATH AND CORONARY ANGIOGRAPHY N/A 04/18/2017   Procedure: LEFT HEART CATH AND CORONARY ANGIOGRAPHY;  Surgeon: Martinique, Peter M, MD;  Location: [facility];  Service: Cardiovascular;  Laterality: N/A;  . POLYPECTOMY  12/05/2018   Procedure: POLYPECTOMY;  Surgeon: Mauri Pole, MD;  Location: [facility];  Service: Endoscopy;;  . SUBMUCOSAL LIFTING INJECTION  12/05/2018   Procedure: SUBMUCOSAL LIFTING INJECTION;  Surgeon: Mauri Pole, MD;  Location: [facility];  Service:  Endoscopy;;  . TONSILLECTOMY AND ADENOIDECTOMY    . TUBAL LIGATION     BILATERAL     reports that she quit smoking about 15 years ago. Her smoking use included cigarettes. She has a 35.00 pack-year smoking history. She has never used smokeless tobacco. She reports that she does not drink alcohol and does not use drugs.  Allergies  Allergen Reactions  . Lipitor [Atorvastatin] Other (See Comments)    Elevated ALT  . Remicade [Infliximab]     REACTION: SOB  . Ticagrelor     Shortness of breath  . Rosuvastatin Other (See Comments)    Pt reports causes muscle aches and leg pains and feet to swell    Family History  Problem Relation Age of Onset  . Lung cancer Maternal Grandmother   . Stroke Father   . Hypertension Mother   . Heart attack Mother   . Heart disease Mother   . Hypertension Brother   . Heart disease Brother   . Hypertension Sister   . Heart disease Sister   . Coronary artery disease Brother   . Colon cancer Neg Hx   . Stomach cancer Neg Hx   . Esophageal cancer Neg Hx   . Rectal cancer Neg Hx     Prior to Admission medications   Medication Sig Start Date End Date Taking? Authorizing Provider  albuterol (PROVENTIL) (2.5 MG/3ML) 0.083% nebulizer solution Take 2.5 mg by nebulization every 4 (four) hours as needed.     [provider]  aspirin 81 MG chewable tablet Chew 81 mg by mouth daily.      [provider]  budesonide (ENTOCORT EC) 3 MG 24 hr capsule Take 3 capsules (9 mg total) by mouth daily. 02/19/20   Mauri Pole, MD  busPIRone (BUSPAR) 10 MG tablet Take 10 mg by mouth 2 (two) times daily.    [provider]  colestipol (COLESTID) 1 g tablet Take 1 tablet (1 g total) by mouth 2 (two) times daily. As needed 01/22/20   Mauri Pole, MD  DULoxetine (CYMBALTA) 60 MG capsule Take 60 mg by mouth daily.    [provider]  enalapril (VASOTEC) 5 MG tablet Take 1 tablet (5 mg total) by mouth daily. 06/24/14   Larey Dresser, MD  EPIPEN 2-PAK 0.3 MG/0.3ML SOAJ injection Inject 0.3 mg into the muscle as needed for anaphylaxis. Reported on 06/27/2015 10/09/14   [provider]  Evolocumab (REPATHA SURECLICK) 425 MG/ML SOAJ Inject 1 pen into the skin every 14 (fourteen) days. 04/14/20   Dorothy Spark, MD  fenofibrate (TRICOR) 145 MG tablet TAKE ONE TABLET BY MOUTH ONCE DAILY 08/20/19   Dorothy Spark, MD  furosemide (LASIX) 40 MG tablet Take 1 tablet (40 mg total) by mouth daily as needed for fluid or edema. 11/27/19   Dorothy Spark, MD  glimepiride (AMARYL) 2 MG tablet Take 2 mg by mouth 2 (two) times a day.    [provider]  loratadine (CLARITIN) 10 MG tablet Take 10 mg by mouth daily.    [provider]  metFORMIN (GLUCOPHAGE) 500 MG tablet Take 1,000 mg by mouth 2 (two) times daily with a meal.    [provider]  metoprolol succinate (TOPROL XL) 25 MG 24 hr tablet Take 1 tablet (25 mg total) by mouth daily. 10/30/19   Dorothy Spark, MD  montelukast (SINGULAIR) 10 MG tablet Take 10 mg by mouth at bedtime.    [provider]  Multiple Vitamins-Minerals (MULTIVITAMIN WITH MINERALS) tablet Take 1 tablet by mouth daily.    [provider]  nitroGLYCERIN (NITROSTAT) 0.4 MG SL tablet Place 0.4 mg under the tongue every 5 (five) minutes as needed for chest pain.    [provider]  pantoprazole (PROTONIX) 40 MG tablet Take 1 tablet (40 mg total) by mouth daily. 01/29/20   Dorothy Spark, MD  Probiotic Product (PROBIOTIC DAILY PO) Take 1 capsule by mouth daily.    [provider]  VASCEPA 1 g capsule Take 2 capsules (2 g total) by mouth 2 (two) times daily. 09/10/19   Dorothy Spark, MD  vedolizumab (ENTYVIO) 300 MG injection Inject 300 mg into the vein every 8 (eight) weeks. After the standard induction 02/21/18   Mauri Pole, MD  Vitamin D3 (VITAMIN D) 25 MCG tablet Take 1,000 Units by mouth at bedtime.    [provider]    Physical Exam: Constitutional: Moderately built and nourished. Vitals:   04/17/20 0030 04/17/20 0100 04/17/20 0200 04/17/20 0300  BP: (!) 116/50 (!) 114/45 (!) 111/42 (!) 132/52  Pulse: 96 93 100 (!) 102  Resp: (!) 23 18 18 20   Temp:      TempSrc:      SpO2: 96% 93% 95% 95%  Weight:      Height:       Eyes: Anicteric no pallor. ENMT: No discharge from the ears eyes nose or mouth. Neck: No mass felt.  No neck rigidity. Respiratory: No rhonchi or crepitations. Cardiovascular: S1-S2 heard. Abdomen: Soft nontender bowel sounds present. Musculoskeletal: No edema. Skin: No rash. Neurologic: Lethargic and arousable moves all extremities oriented to name and place no facial asymmetry pupils equal reacting to light. Psychiatric: Lethargic.  Oriented to name.   Labs on Admission: I have personally reviewed following labs and imaging studies  CBC: Recent Labs  Lab 04/16/20 2345 04/17/20 0028  WBC 13.2*  --   NEUTROABS 9.2*  --   HGB 14.7 16.0*  HCT 45.4 47.0*  MCV 91.7  --   PLT 281  --    Basic Metabolic Panel: Recent Labs  Lab 04/17/20 0028  NA 135  K 3.4*  CL 94*  GLUCOSE 228*  BUN 22  CREATININE 1.20*   GFR: Estimated Creatinine Clearance: 45.5 mL/min (A) (by C-G formula based on SCr of 1.2 mg/dL (H)). Liver Function Tests: No results for input(s): AST, ALT, ALKPHOS, BILITOT, PROT, ALBUMIN in the last 168 hours. No results for input(s): LIPASE, AMYLASE in the last 168 hours. No results for input(s): AMMONIA in the last 168 hours. Coagulation Profile: No results for input(s): INR, PROTIME in the last 168 hours. Cardiac Enzymes: No results for input(s): CKTOTAL, CKMB, CKMBINDEX, TROPONINI in the last 168 hours. BNP (last 3 results) No results for input(s): PROBNP in the last 8760 hours. HbA1C: No results for input(s): HGBA1C in the last 72 hours. CBG: No results for input(s): GLUCAP in the last 168 hours.  Lipid Profile: No results for  input(s): CHOL, HDL, LDLCALC, TRIG, CHOLHDL, LDLDIRECT in the last 72 hours. Thyroid Function Tests: No results for input(s): TSH, T4TOTAL, FREET4, T3FREE, THYROIDAB in the last 72 hours. Anemia Panel: No results for input(s): VITAMINB12, FOLATE, FERRITIN, TIBC, IRON, RETICCTPCT in the last 72 hours. Urine analysis:    Component Value Date/Time   COLORURINE AMBER (A) 04/16/2020 2345   APPEARANCEUR CLOUDY (A) 04/16/2020 2345   LABSPEC 1.021 04/16/2020 2345   PHURINE 5.0 04/16/2020 2345   GLUCOSEU 50 (A) 04/16/2020 2345   HGBUR NEGATIVE 04/16/2020 2345   BILIRUBINUR NEGATIVE 04/16/2020 2345   BILIRUBINUR negative 04/02/2015 1502   BILIRUBINUR neg 09/08/2013 1512   KETONESUR 5 (A) 04/16/2020 2345   PROTEINUR 100 (A) 04/16/2020 2345   UROBILINOGEN 0.2 04/02/2015 1502   UROBILINOGEN 1.0 08/02/2014 0628   NITRITE NEGATIVE 04/16/2020 2345   LEUKOCYTESUR TRACE (A) 04/16/2020 2345   Sepsis Labs: @LABRCNTIP (procalcitonin:4,lacticidven:4) )No results found for this or any previous visit (from the past 240 hour(s)).   Radiological Exams on Admission: DG Chest Portable 1 View  Result Date: 04/17/2020 CLINICAL DATA:  Initial evaluation for acute fatigue. EXAM: PORTABLE CHEST 1 VIEW COMPARISON:  Prior radiograph from 08/01/2019. FINDINGS: Transverse heart size stable, and remains within normal limits. Mediastinal silhouette within normal limits. Aortic atherosclerosis. Lungs are hypoinflated. Linear left basilar atelectasis and/or scarring. No focal infiltrates. No edema or effusion. No pneumothorax. No acute osseous finding. IMPRESSION: 1. Shallow lung inflation with mild left basilar atelectasis and/or scarring. 2. No other active cardiopulmonary disease. 3.  Aortic Atherosclerosis (ICD10-I70.0). Electronically Signed   By: Jeannine Boga M.D.   On: 04/17/2020 01:48    EKG: Independently reviewed.  Normal sinus rhythm prolonged QTC.  Assessment/Plan Active Problems:   Essential  hypertension   Crohn's disease ([place])   Coronary artery disease involving native coronary artery of native heart without angina pectoris   Diabetes mellitus type 2, uncomplicated (HCC)   S/P angioplasty with stent 04/18/17 with DES to pLAD   Acute encephalopathy   Nausea & vomiting    1. Acute encephalopathy/lethargy cause not clear.  UA shows features concerning for UTI for which patient is on ceftriaxone.  CT head is pending will check ammonia levels.  Patient is easily arousable and follows commands.  No signs of any seizures.  We will gently hydrate.  Urine drug screen is negative.  2. Possible UTI on ceftriaxone follow urine cultures. 3. Acute renal failure we will gently hydrate follow metabolic panel. 4. Nausea vomiting after coming to the ER.  CT abdomen pelvis are pending. 5. CAD status post stenting denies any chest pain on aspirin metoprolol. 6. History of Crohn's disease abdomen appears benign.  Since patient has nausea vomiting CT abdomen has been ordered. 7. Diabetes mellitus type 2 with hyperglycemia sliding scale coverage.  Patient's home medications needs to be verified.   DVT prophylaxis: Lovenox. Code Status: Full code. Family Communication: Patient's husband. Disposition Plan: Home. Consults called: None. Admission status: Observation.   Rise Patience MD [facility] Pager [phone].  If 7PM-7AM, please contact night-coverage [email or web address] Password [email or web address]  04/17/2020, 3:24 AM

## 2020-04-17 NOTE — ED Provider Notes (Signed)
[facility] DEPT Provider Note   CSN: [id number] Arrival date & time: 04/16/20  2212     History No chief complaint on file.   [name] is a 59 y.o. female.  The history is provided by the spouse. The history is limited by the condition of the patient.  Altered Mental Status Presenting symptoms comment:  Sleeping  Severity:  Moderate Most recent episode:  Yesterday Episode history:  Single Duration:  1 day Timing:  Constant Progression:  Unchanged Chronicity:  New Context: not dementia, not head injury, not homeless and not recent change in medication   Associated symptoms: no agitation, no depression, no fever, no nausea, no seizures, no slurred speech, no suicidal behavior and no vomiting        Past Medical History:  Diagnosis Date  . Allergic rhinitis due to pollen   . Allergy   . Anxiety state, unspecified   . Arthritis   . Bronchitis    hx  . CAD (coronary artery disease)    s/p NSTEMI in 11/18 treated with a DES to the proximal LAD  . Calculus of gallbladder without mention of cholecystitis or obstruction   . Crohn disease ([place])   . Depressive disorder, not elsewhere classified   . Diabetes mellitus without complication (HCC)    diet controlled  . Esophageal reflux   . Fibromyalgia   . Grade I diastolic dysfunction    Noted on ECHO  . Headache   . History of nuclear stress test    ETT-Myoview 10/17: EF 55%, normal perfusion; Low Risk // Nuclear stress test 05/2018:  EF 63, normal perfusion; Low Risk  . Myalgia and myositis, unspecified   . NSTEMI (non-ST elevated myocardial infarction) ([place]) 04/15/2017  . Other and unspecified hyperlipidemia   . Palpitations   . Pneumonia 13   hx  . S/P angioplasty with stent 04/18/17 with DES to pLAD 04/19/2017  . Seizures ([place])    1 seizure as a teenager   . Unspecified essential hypertension   . Vitamin B deficiency     Patient Active Problem List   Diagnosis Date Noted  .  Statin myopathy 09/07/2019  . Myalgia due to HMG CoA reductase inhibitor 02/16/2019  . History of colonic polyps   . Adenomatous polyp of cecum   . Polyp of transverse colon   . Rectal pain 02/14/2018  . History of Crohn's disease 02/14/2018  . Chronic fatigue 01/14/2018  . Obstructive sleep apnea 09/17/2017  . Diabetes mellitus ([place]) 09/17/2017  . S/P angioplasty with stent 04/18/17 with DES to pLAD 04/19/2017  . NSTEMI (non-ST elevated myocardial infarction) ([place])   . SOB (shortness of breath)   . Family history of early CAD   . Chest pain 04/16/2017  . Fatty infiltration of liver 12/23/2015  . Coronary artery disease involving native coronary artery of native heart without angina pectoris 10/10/2011  . Diabetes mellitus type 2, uncomplicated ([place]) 42/59/5638  . Essential hypertension 08/18/2009  . CHEST PAIN UNSPECIFIED 08/01/2009  . B12 DEFICIENCY 01/06/2009  . Hyperlipidemia LDL goal <70 03/16/2007  . ANXIETY 03/16/2007  . DEPRESSION 03/16/2007  . Seasonal and perennial allergic rhinitis 03/16/2007  . GERD 03/16/2007  . CHOLELITHIASIS W/O CHOLECYSTITIS W/O OBST 03/16/2007  . SACROILIITIS NEC 03/16/2007  . FIBROMYALGIA 03/16/2007  . PALPITATIONS 03/16/2007  . Crohn's disease ([place]) 03/16/2007    Past Surgical History:  Procedure Laterality Date  . BIOPSY  12/05/2018   Procedure: BIOPSY;  Surgeon: Mauri Pole,  MD;  Location: [facility];  Service: Endoscopy;;  . CHOLECYSTECTOMY N/A 09/19/2014   Procedure: LAPAROSCOPIC CHOLECYSTECTOMY WITH INTRAOPERATIVE CHOLANGIOGRAM;  Surgeon: Donnie Mesa, MD;  Location: [facility];  Service: General;  Laterality: N/A;  . COLONOSCOPY WITH PROPOFOL N/A 12/05/2018   Procedure: COLONOSCOPY WITH PROPOFOL;  Surgeon: Mauri Pole, MD;  Location: [facility];  Service: Endoscopy;  Laterality: N/A;  chromoendoscopy  . CORONARY STENT INTERVENTION N/A 04/18/2017   Procedure: CORONARY STENT INTERVENTION;  Surgeon: Martinique, Peter M, MD;   Location: [facility];  Service: Cardiovascular;  Laterality: N/A;  . ESOPHAGOGASTRODUODENOSCOPY    . EXPLORATORY LAPAROTOMY     for endometriosis  . INTRAVASCULAR PRESSURE WIRE/FFR STUDY N/A 04/18/2017   Procedure: INTRAVASCULAR PRESSURE WIRE/FFR STUDY;  Surgeon: Martinique, Peter M, MD;  Location: [facility];  Service: Cardiovascular;  Laterality: N/A;  . LEFT HEART CATH AND CORONARY ANGIOGRAPHY N/A 04/18/2017   Procedure: LEFT HEART CATH AND CORONARY ANGIOGRAPHY;  Surgeon: Martinique, Peter M, MD;  Location: [facility];  Service: Cardiovascular;  Laterality: N/A;  . POLYPECTOMY  12/05/2018   Procedure: POLYPECTOMY;  Surgeon: Mauri Pole, MD;  Location: [facility];  Service: Endoscopy;;  . SUBMUCOSAL LIFTING INJECTION  12/05/2018   Procedure: SUBMUCOSAL LIFTING INJECTION;  Surgeon: Mauri Pole, MD;  Location: [facility];  Service: Endoscopy;;  . TONSILLECTOMY AND ADENOIDECTOMY    . TUBAL LIGATION     BILATERAL     OB History   No obstetric history on file.     Family History  Problem Relation Age of Onset  . Lung cancer Maternal Grandmother   . Stroke Father   . Hypertension Mother   . Heart attack Mother   . Heart disease Mother   . Hypertension Brother   . Heart disease Brother   . Hypertension Sister   . Heart disease Sister   . Coronary artery disease Brother   . Colon cancer Neg Hx   . Stomach cancer Neg Hx   . Esophageal cancer Neg Hx   . Rectal cancer Neg Hx     Social History   Tobacco Use  . Smoking status: Former Smoker    Packs/day: 1.00    Years: 35.00    Pack years: 35.00    Types: Cigarettes    Quit date: 05/31/2004    Years since quitting: 15.8  . Smokeless tobacco: Never Used  Vaping Use  . Vaping Use: Never used  Substance Use Topics  . Alcohol use: No  . Drug use: No    Home Medications Prior to Admission medications   Medication Sig Start Date End Date Taking? Authorizing Provider  albuterol (PROVENTIL)  (2.5 MG/3ML) 0.083% nebulizer solution Take 2.5 mg by nebulization every 4 (four) hours as needed.     [provider]  aspirin 81 MG chewable tablet Chew 81 mg by mouth daily.      [provider]  budesonide (ENTOCORT EC) 3 MG 24 hr capsule Take 3 capsules (9 mg total) by mouth daily. 02/19/20   Mauri Pole, MD  busPIRone (BUSPAR) 10 MG tablet Take 10 mg by mouth 2 (two) times daily.    [provider]  colestipol (COLESTID) 1 g tablet Take 1 tablet (1 g total) by mouth 2 (two) times daily. As needed 01/22/20   Mauri Pole, MD  DULoxetine (CYMBALTA) 60 MG capsule Take 60 mg by mouth daily.    [provider]  enalapril (VASOTEC) 5 MG  tablet Take 1 tablet (5 mg total) by mouth daily. 06/24/14   Larey Dresser, MD  EPIPEN 2-PAK 0.3 MG/0.3ML SOAJ injection Inject 0.3 mg into the muscle as needed for anaphylaxis. Reported on 06/27/2015 10/09/14   [provider]  Evolocumab (REPATHA SURECLICK) 395 MG/ML SOAJ Inject 1 pen into the skin every 14 (fourteen) days. 04/14/20   Dorothy Spark, MD  fenofibrate (TRICOR) 145 MG tablet TAKE ONE TABLET BY MOUTH ONCE DAILY 08/20/19   Dorothy Spark, MD  furosemide (LASIX) 40 MG tablet Take 1 tablet (40 mg total) by mouth daily as needed for fluid or edema. 11/27/19   Dorothy Spark, MD  glimepiride (AMARYL) 2 MG tablet Take 2 mg by mouth 2 (two) times a day.    [provider]  loratadine (CLARITIN) 10 MG tablet Take 10 mg by mouth daily.    [provider]  metFORMIN (GLUCOPHAGE) 500 MG tablet Take 1,000 mg by mouth 2 (two) times daily with a meal.    [provider]  metoprolol succinate (TOPROL XL) 25 MG 24 hr tablet Take 1 tablet (25 mg total) by mouth daily. 10/30/19   Dorothy Spark, MD  montelukast (SINGULAIR) 10 MG tablet Take 10 mg by mouth at bedtime.    [provider]  Multiple Vitamins-Minerals (MULTIVITAMIN WITH MINERALS) tablet Take 1 tablet by  mouth daily.    [provider]  nitroGLYCERIN (NITROSTAT) 0.4 MG SL tablet Place 0.4 mg under the tongue every 5 (five) minutes as needed for chest pain.    [provider]  pantoprazole (PROTONIX) 40 MG tablet Take 1 tablet (40 mg total) by mouth daily. 01/29/20   Dorothy Spark, MD  Probiotic Product (PROBIOTIC DAILY PO) Take 1 capsule by mouth daily.    [provider]  VASCEPA 1 g capsule Take 2 capsules (2 g total) by mouth 2 (two) times daily. 09/10/19   Dorothy Spark, MD  vedolizumab (ENTYVIO) 300 MG injection Inject 300 mg into the vein every 8 (eight) weeks. After the standard induction 02/21/18   Mauri Pole, MD  Vitamin D3 (VITAMIN D) 25 MCG tablet Take 1,000 Units by mouth at bedtime.    [provider]    Allergies    Lipitor [atorvastatin], Remicade [infliximab], Ticagrelor, and Rosuvastatin  Review of Systems   Review of Systems  Unable to perform ROS: Mental status change  Constitutional: Negative for fever.  Gastrointestinal: Negative for nausea and vomiting.  Neurological: Negative for seizures.  Psychiatric/Behavioral: Negative for agitation.    Physical Exam Updated Vital Signs BP (!) 116/50   Pulse 96   Temp 98.9 F (37.2 C) (Oral)   Resp (!) 23   Ht 5' 3"  (1.6 m)   Wt 68 kg   LMP  (LMP Unknown)   SpO2 96%   BMI 26.57 kg/m   Physical Exam Vitals and nursing note reviewed.  Constitutional:      General: She is not in acute distress. HENT:     Head: Normocephalic and atraumatic.     Nose: Nose normal.  Eyes:     Conjunctiva/sclera: Conjunctivae normal.     Pupils: Pupils are equal, round, and reactive to light.  Cardiovascular:     Rate and Rhythm: Normal rate and regular rhythm.     Pulses: Normal pulses.     Heart sounds: Normal heart sounds.  Pulmonary:     Breath sounds: No stridor. No wheezing.  Abdominal:  General: Abdomen is flat. Bowel sounds are normal.     Palpations: Abdomen is  soft.     Tenderness: There is no abdominal tenderness. There is no guarding or rebound.  Musculoskeletal:        General: No swelling or tenderness. Normal range of motion.     Cervical back: Normal range of motion and neck supple.     Right lower leg: No edema.     Left lower leg: No edema.  Skin:    General: Skin is warm and dry.     Capillary Refill: Capillary refill takes less than 2 seconds.  Neurological:     General: No focal deficit present.     Deep Tendon Reflexes: Reflexes normal.     Comments: Sleeping but easily arousible      ED Results / Procedures / Treatments   Labs (all labs ordered are listed, but only abnormal results are displayed) Results for orders placed or performed during the hospital encounter of 04/16/20  CBC with Differential/Platelet  Result Value Ref Range   WBC 13.2 (H) 4.0 - 10.5 K/uL   RBC 4.95 3.87 - 5.11 MIL/uL   Hemoglobin 14.7 12.0 - 15.0 g/dL   HCT 45.4 36 - 46 %   MCV 91.7 80.0 - 100.0 fL   MCH 29.7 26.0 - 34.0 pg   MCHC 32.4 30.0 - 36.0 g/dL   RDW 14.8 11.5 - 15.5 %   Platelets 281 150 - 400 K/uL   nRBC 0.0 0.0 - 0.2 %   Neutrophils Relative % 69 %   Neutro Abs 9.2 (H) 1.7 - 7.7 K/uL   Lymphocytes Relative 16 %   Lymphs Abs 2.1 0.7 - 4.0 K/uL   Monocytes Relative 12 %   Monocytes Absolute 1.6 (H) 0.1 - 1.0 K/uL   Eosinophils Relative 0 %   Eosinophils Absolute 0.0 0.0 - 0.5 K/uL   Basophils Relative 1 %   Basophils Absolute 0.1 0.0 - 0.1 K/uL   Immature Granulocytes 2 %   Abs Immature Granulocytes 0.21 (H) 0.00 - 0.07 K/uL  Urinalysis, Routine w reflex microscopic Urine, Catheterized  Result Value Ref Range   Color, Urine AMBER (A) YELLOW   APPearance CLOUDY (A) CLEAR   Specific Gravity, Urine 1.021 1.005 - 1.030   pH 5.0 5.0 - 8.0   Glucose, UA 50 (A) NEGATIVE mg/dL   Hgb urine dipstick NEGATIVE NEGATIVE   Bilirubin Urine NEGATIVE NEGATIVE   Ketones, ur 5 (A) NEGATIVE mg/dL   Protein, ur 100 (A) NEGATIVE mg/dL    Nitrite NEGATIVE NEGATIVE   Leukocytes,Ua TRACE (A) NEGATIVE   RBC / HPF 6-10 0 - 5 RBC/hpf   WBC, UA >50 (H) 0 - 5 WBC/hpf   Bacteria, UA FEW (A) NONE SEEN   Squamous Epithelial / LPF 0-5 0 - 5   Mucus PRESENT    Hyaline Casts, UA PRESENT    Non Squamous Epithelial 0-5 (A) NONE SEEN  Rapid urine drug screen (hospital performed)  Result Value Ref Range   Opiates NONE DETECTED NONE DETECTED   Cocaine NONE DETECTED NONE DETECTED   Benzodiazepines NONE DETECTED NONE DETECTED   Amphetamines NONE DETECTED NONE DETECTED   Tetrahydrocannabinol NONE DETECTED NONE DETECTED   Barbiturates NONE DETECTED NONE DETECTED  Ethanol  Result Value Ref Range   Alcohol, Ethyl (B) <10 <10 mg/dL  I-stat chem 8, ED (not at [facility] or [facility])  Result Value Ref Range   Sodium 135 135 - 145 mmol/L  Potassium 3.4 (L) 3.5 - 5.1 mmol/L   Chloride 94 (L) 98 - 111 mmol/L   BUN 22 8 - 23 mg/dL   Creatinine, Ser 1.20 (H) 0.44 - 1.00 mg/dL   Glucose, Bld 228 (H) 70 - 99 mg/dL   Calcium, Ion 0.99 (L) 1.15 - 1.40 mmol/L   TCO2 24 22 - 32 mmol/L   Hemoglobin 16.0 (H) 12.0 - 15.0 g/dL   HCT 47.0 (H) 36 - 46 %  Troponin I (High Sensitivity)  Result Value Ref Range   Troponin I (High Sensitivity) 4 <18 ng/L   No results found.  EKG EKG Interpretation  Date/Time:  Wednesday April 16 2020 23:56:44 EST Ventricular Rate:  93 PR Interval:    QRS Duration: 100 QT Interval:  436 QTC Calculation: 543 R Axis:   89 Text Interpretation: Sinus rhythm Borderline right axis deviation Borderline ST depression, diffuse leads Prolonged QT interval Confirmed by Dory Horn) on 04/16/2020 11:59:59 PM   Radiology No results found.  Procedures Procedures (including critical care time)  Medications Ordered in ED Medications  naloxone (NARCAN) injection 1 mg (1 mg Intravenous Given 04/17/20 0015)  cefTRIAXone (ROCEPHIN) 1 g in sodium chloride 0.9 % 100 mL IVPB (has no administration in time range)  0.9 %   sodium chloride infusion (has no administration in time range)  sodium bicarbonate injection 50 mEq (has no administration in time range)    ED Course  I have reviewed the triage vital signs and the nursing notes.  Pertinent labs & imaging results that were available during my care of the patient were reviewed by me and considered in my medical decision making (see chart for details).    Rocephin for PNA, given AMS will need admission.  No QT prolonging agents due to QT on EKG.    Final Clinical Impression(s) / ED Diagnoses Final diagnoses:  Community acquired pneumonia, unspecified laterality  Prolonged Q-T interval on ECG  Altered mental status, unspecified altered mental status type   Admit to medicine    Lashawn Bromwell, MD 04/17/20 0130

## 2020-04-18 ENCOUNTER — Observation Stay (HOSPITAL_COMMUNITY): Payer: BC Managed Care – PPO

## 2020-04-18 DIAGNOSIS — N390 Urinary tract infection, site not specified: Secondary | ICD-10-CM | POA: Diagnosis not present

## 2020-04-18 DIAGNOSIS — G9340 Encephalopathy, unspecified: Secondary | ICD-10-CM | POA: Diagnosis not present

## 2020-04-18 DIAGNOSIS — J9811 Atelectasis: Secondary | ICD-10-CM | POA: Diagnosis not present

## 2020-04-18 LAB — COMPREHENSIVE METABOLIC PANEL
ALT: 24 U/L (ref 0–44)
AST: 16 U/L (ref 15–41)
Albumin: 3.3 g/dL — ABNORMAL LOW (ref 3.5–5.0)
Alkaline Phosphatase: 54 U/L (ref 38–126)
Anion gap: 8 (ref 5–15)
BUN: <5 mg/dL — ABNORMAL LOW (ref 8–23)
CO2: 24 mmol/L (ref 22–32)
Calcium: 8.1 mg/dL — ABNORMAL LOW (ref 8.9–10.3)
Chloride: 107 mmol/L (ref 98–111)
Creatinine, Ser: 0.61 mg/dL (ref 0.44–1.00)
GFR, Estimated: >60 mL/min (ref >60)
Glucose, Bld: 193 mg/dL — ABNORMAL HIGH (ref 70–99)
Potassium: 3.9 mmol/L (ref 3.5–5.1)
Sodium: 139 mmol/L (ref 135–145)
Total Bilirubin: 0.5 mg/dL (ref 0.3–1.2)
Total Protein: 6 g/dL — ABNORMAL LOW (ref 6.5–8.1)

## 2020-04-18 LAB — HEMOGLOBIN A1C
Hgb A1c MFr Bld: 8.2 % — ABNORMAL HIGH (ref 4.8–5.6)
Mean Plasma Glucose: 188.64 mg/dL

## 2020-04-18 LAB — CBC WITH DIFFERENTIAL/PLATELET
Abs Immature Granulocytes: 0.08 10*3/uL — ABNORMAL HIGH (ref 0.00–0.07)
Basophils Absolute: 0 10*3/uL (ref 0.0–0.1)
Basophils Relative: 0 %
Eosinophils Absolute: 0 10*3/uL (ref 0.0–0.5)
Eosinophils Relative: 1 %
HCT: 38.5 % (ref 36.0–46.0)
Hemoglobin: 12.1 g/dL (ref 12.0–15.0)
Immature Granulocytes: 1 %
Lymphocytes Relative: 21 %
Lymphs Abs: 1.7 10*3/uL (ref 0.7–4.0)
MCH: 29.2 pg (ref 26.0–34.0)
MCHC: 31.4 g/dL (ref 30.0–36.0)
MCV: 93 fL (ref 80.0–100.0)
Monocytes Absolute: 0.6 10*3/uL (ref 0.1–1.0)
Monocytes Relative: 7 %
Neutro Abs: 5.8 10*3/uL (ref 1.7–7.7)
Neutrophils Relative %: 70 %
Platelets: 263 10*3/uL (ref 150–400)
RBC: 4.14 MIL/uL (ref 3.87–5.11)
RDW: 14.1 % (ref 11.5–15.5)
WBC: 8.2 10*3/uL (ref 4.0–10.5)
nRBC: 0 % (ref 0.0–0.2)

## 2020-04-18 LAB — URINE CULTURE: Culture: NO GROWTH

## 2020-04-18 LAB — GLUCOSE, CAPILLARY
Glucose-Capillary: 169 mg/dL — ABNORMAL HIGH (ref 70–99)
Glucose-Capillary: 147 mg/dL — ABNORMAL HIGH (ref 70–99)

## 2020-04-18 MED ORDER — INSULIN GLARGINE 100 UNIT/ML ~~LOC~~ SOLN: 10.0000 [IU] | Freq: Every day | SUBCUTANEOUS | 11 refills | Status: DC

## 2020-04-18 MED ORDER — SULFAMETHOXAZOLE-TRIMETHOPRIM 800-160 MG PO TABS: 1.0000 | ORAL_TABLET | Freq: Two times a day (BID) | ORAL | 0 refills | Status: AC

## 2020-04-18 NOTE — Discharge Summary (Signed)
Physician Discharge Summary  [name] LDJ:[id number] DOB: Apr 28, 1961 DOA: 04/16/2020  PCP: [place] date: 04/16/2020 Discharge date: 04/18/2020  Time spent: 40 minutes  Recommendations for Outpatient Follow-up:  1. Follow outpatient CBC/CMP 2. Follow urine culture outpatient - drawn after abx 3. lantus decreased on discharge as BG's inpatient were <200 without any basal insulin and she's also on actos, amaryl, and metformin - follow blood sugars as she returns home   Discharge Diagnoses:  Active Problems:   Essential hypertension   Crohn's disease ([place])   Coronary artery disease involving native coronary artery of native heart without angina pectoris   Diabetes mellitus type 2, uncomplicated ([place])   S/P angioplasty with stent 04/18/17 with DES to pLAD   Acute encephalopathy   Nausea & vomiting   Community acquired pneumonia   Discharge Condition: stable  Diet recommendation: diabetic  Filed Weights   04/16/20 2221 04/17/20 0402  Weight: 68 kg 63.6 kg    History of present illness:  [name] Gervase Colberg 59 y.o.femalewithhistory of Crohn's disease, diabetes mellitus type 2, CAD status post stenting was found to be increasingly lethargic since yesterday morning. Patient has not gone to the office yesterday. Patient has been came back later in the evening and found patient was very lethargic at home and was brought to the ER. Has not had any new changes in medications denies any headache chest pain shortness of breath. Did not have any nausea vomiting until patient came to the ER. Did not have any fever or chills.  ED Course:In the ER patient was afebrile appeared nonfocal. ABG did not show any carbon dioxide retention or any elevated carbon monoxide levels. Labs are significant for possible UTI and mild renal failure. Patient was started on fluids ceftriaxone admitted for further observation. CT head and CT renal studies are pending.  She was admitted  for acute metabolic encephalopathy, she's improved with antibiotics for [name] UTI.  She was discharged on 11/19 with antibiotics in stable condition.    See below for additional details  Hospital Course:  1. Acute Metabolic Encephalopathy 1. Resolved  1. Likely 2/2 UTI, improved with abx 2. Negative head CT, normal ammonia, negative etoh 3. Negative utox   2. Possible UTI on ceftriaxone follow urine cultures. 1. UA with few bacteria, +RBC's, >50 WBS 2. Urine culture after abx pending -> follow final results 3. Continue ceftriaxone -> bactrim on discharge 4. CXR 11/18 with mild L atelectasis or scarring - repeat CXR with improved aeration with mild residual L base subsegmental atelectasis 5. CT abdomen/pelvis, without acute findings  3. Hypokalemia: replace and follow  4. Acute renal failure improved, continue to monitor  5. Nausea vomiting - resolved  6. CAD status post stenting denies any chest pain on aspirin metoprolol.  7. History of Crohn's disease abdomen appears benign. Since patient has nausea vomiting CT abdomen has been ordered -> no acute findings  8. Diabetes mellitus type 2 with hyperglycemia sliding scale coverage. 1. She's on high dose of lantus at home with metformin, actos, and amaryl - BG's here have been <200 without any basal insulin and only on SSI.  Will decrease lantus and follow BG's outpatient.  Procedures:  none  Consultations:  none  Discharge Exam: Vitals:   04/18/20 0508 04/18/20 1453  BP: (!) 155/70 (!) 142/71  Pulse: 78 78  Resp: 18 15  Temp: 97.8 F (36.6 C) 98 F (36.7 C)  SpO2: 97% 98%   No new complaints  Feeling better Delaynie Stetzer&Ox3  General: No acute distress. Cardiovascular: Heart sounds show Chief Walkup regular rate, and rhythm. Lungs: Clear to auscultation bilaterally  Abdomen: Soft, nontender, nondistended  Neurological: Alert and oriented 3. Moves all extremities 4. Cranial nerves II through XII grossly intact. Skin: Warm and  dry. No rashes or lesions. Extremities: No clubbing or cyanosis. No edema  Discharge Instructions   Discharge Instructions    Call MD for:  difficulty breathing, headache or visual disturbances   Complete by: As directed    Call MD for:  hives   Complete by: As directed    Call MD for:  persistant dizziness or light-headedness   Complete by: As directed    Call MD for:  persistant nausea and vomiting   Complete by: As directed    Call MD for:  redness, tenderness, or signs of infection (pain, swelling, redness, odor or green/yellow discharge around incision site)   Complete by: As directed    Call MD for:  severe uncontrolled pain   Complete by: As directed    Call MD for:  temperature >100.4   Complete by: As directed    Diet - low sodium heart healthy   Complete by: As directed    Discharge instructions   Complete by: As directed    You were seen for confusion.  I think this was due to Livia Tarr UTI.  You've improved with antibiotics.  We'll send you home with bactrim for another couple of days.  Follow your final culture outpatient with your PCP.   Your blood sugar has been ok off your lantus.  Please decrease your lantus to 10 units daily while you resume your metformin, actos, and amaryl.  Watch your blood sugar closely.  You may need to adjust your diabetes medications as you return home.    Return for new, recurrent, or worsening symptoms.  Please ask your PCP to request records from this hospitalization so they know what was done and what the next steps will be.   Increase activity slowly   Complete by: As directed      Allergies as of 04/18/2020      Reactions   Lipitor [atorvastatin] Other (See Comments)   Elevated ALT   Remicade [infliximab]    REACTION: SOB   Ticagrelor    Shortness of breath   Rosuvastatin Other (See Comments)   Pt reports causes muscle aches and leg pains and feet to swell      Medication List    TAKE these medications   albuterol (2.5  MG/3ML) 0.083% nebulizer solution Commonly known as: PROVENTIL Take 2.5 mg by nebulization every 4 (four) hours as needed for wheezing or shortness of breath.   aspirin 81 MG chewable tablet Chew 81 mg by mouth daily.   budesonide 3 MG 24 hr capsule Commonly known as: ENTOCORT EC Take 3 capsules (9 mg total) by mouth daily.   busPIRone 10 MG tablet Commonly known as: BUSPAR Take 10 mg by mouth 2 (two) times daily.   colestipol 1 g tablet Commonly known as: COLESTID Take 1 tablet (1 g total) by mouth 2 (two) times daily. As needed What changed: additional instructions   doxycycline 100 MG tablet Commonly known as: VIBRA-TABS Take 100 mg by mouth 2 (two) times daily as needed. Flare ups   DULoxetine 60 MG capsule Commonly known as: CYMBALTA Take 60 mg by mouth daily.   enalapril 5 MG tablet Commonly known as: VASOTEC Take 1 tablet (5 mg total) by  mouth daily.   EpiPen 2-Pak 0.3 mg/0.3 mL Soaj injection Generic drug: EPINEPHrine Inject 0.3 mg into the muscle as needed for anaphylaxis. Reported on 06/27/2015   fenofibrate 145 MG tablet Commonly known as: TRICOR TAKE ONE TABLET BY MOUTH ONCE DAILY   furosemide 40 MG tablet Commonly known as: LASIX Take 1 tablet (40 mg total) by mouth daily as needed for fluid or edema.   glimepiride 2 MG tablet Commonly known as: AMARYL Take 2 mg by mouth 2 (two) times Dehlia Kilner day.   insulin glargine 100 UNIT/ML injection Commonly known as: LANTUS Inject 0.1 mLs (10 Units total) into the skin at bedtime. What changed: how much to take   levocetirizine 5 MG tablet Commonly known as: XYZAL Take 5 mg by mouth every evening.   loratadine 10 MG tablet Commonly known as: CLARITIN Take 10 mg by mouth daily.   metFORMIN 500 MG tablet Commonly known as: GLUCOPHAGE Take 1,000 mg by mouth 2 (two) times daily with Jeyli Zwicker meal.   metoprolol succinate 25 MG 24 hr tablet Commonly known as: Toprol XL Take 1 tablet (25 mg total) by mouth daily.    montelukast 10 MG tablet Commonly known as: SINGULAIR Take 10 mg by mouth at bedtime.   multivitamin with minerals tablet Take 1 tablet by mouth daily.   nitroGLYCERIN 0.4 MG SL tablet Commonly known as: NITROSTAT Place 0.4 mg under the tongue every 5 (five) minutes as needed for chest pain.   pantoprazole 40 MG tablet Commonly known as: PROTONIX Take 1 tablet (40 mg total) by mouth daily.   pioglitazone 30 MG tablet Commonly known as: ACTOS Take 30 mg by mouth daily.   PROBIOTIC DAILY PO Take 1 capsule by mouth daily.   Repatha SureClick 242 MG/ML Soaj Generic drug: Evolocumab Inject 1 pen into the skin every 14 (fourteen) days. What changed: how much to take   sulfamethoxazole-trimethoprim 800-160 MG tablet Commonly known as: BACTRIM DS Take 1 tablet by mouth 2 (two) times daily for 3 days.   Vascepa 1 g capsule Generic drug: icosapent Ethyl Take 2 capsules (2 g total) by mouth 2 (two) times daily.   vedolizumab 300 MG injection Commonly known as: Entyvio Inject 300 mg into the vein every 8 (eight) weeks. After the standard induction   Vitamin D3 25 MCG tablet Commonly known as: Vitamin D Take 1,000 Units by mouth at bedtime.      Allergies  Allergen Reactions  . Lipitor [Atorvastatin] Other (See Comments)    Elevated ALT  . Remicade [Infliximab]     REACTION: SOB  . Ticagrelor     Shortness of breath  . Rosuvastatin Other (See Comments)    Pt reports causes muscle aches and leg pains and feet to swell      The results of significant diagnostics from this hospitalization (including imaging, microbiology, ancillary and laboratory) are listed below for reference.    Significant Diagnostic Studies: CT Head Wo Contrast  Result Date: 04/17/2020 CLINICAL DATA:  Delirium.  Headache and weakness. EXAM: CT HEAD WITHOUT CONTRAST TECHNIQUE: Contiguous axial images were obtained from the base of the skull through the vertex without intravenous contrast.  COMPARISON:  None. FINDINGS: Brain: No evidence of acute infarction, hemorrhage, hydrocephalus, extra-axial collection or mass lesion/mass effect. Vascular: No hyperdense vessel or unexpected calcification. Skull: Normal. Negative for fracture or focal lesion. Sinuses/Orbits: No acute finding. Other: None. IMPRESSION: Negative head CT. Electronically Signed   By: Markus Daft M.D.   On: 04/17/2020 10:58  DG CHEST PORT 1 VIEW  Result Date: 04/18/2020 CLINICAL DATA:  Atelectasis. EXAM: PORTABLE CHEST 1 VIEW COMPARISON:  04/16/2020. FINDINGS: Mediastinum hilar structures normal. Heart size stable. No pulmonary venous congestion. Improved aeration in the lung bases with mild residual left base subsegmental atelectasis. No pleural effusion or pneumothorax. IMPRESSION: Improved aeration in the lung bases with mild residual left base subsegmental atelectasis. Electronically Signed   By: Marcello Moores  Register   On: 04/18/2020 05:46   DG Chest Portable 1 View  Result Date: 04/17/2020 CLINICAL DATA:  Initial evaluation for acute fatigue. EXAM: PORTABLE CHEST 1 VIEW COMPARISON:  Prior radiograph from 08/01/2019. FINDINGS: Transverse heart size stable, and remains within normal limits. Mediastinal silhouette within normal limits. Aortic atherosclerosis. Lungs are hypoinflated. Linear left basilar atelectasis and/or scarring. No focal infiltrates. No edema or effusion. No pneumothorax. No acute osseous finding. IMPRESSION: 1. Shallow lung inflation with mild left basilar atelectasis and/or scarring. 2. No other active cardiopulmonary disease. 3.  Aortic Atherosclerosis (ICD10-I70.0). Electronically Signed   By: Jeannine Boga M.D.   On: 04/17/2020 01:48   CT RENAL STONE STUDY  Result Date: 04/17/2020 CLINICAL DATA:  Flank pain, kidney stone suspected. EXAM: CT ABDOMEN AND PELVIS WITHOUT CONTRAST TECHNIQUE: Multidetector CT imaging of the abdomen and pelvis was performed following the standard protocol without IV  contrast. COMPARISON:  Is MRI pelvis February 14, 2018 and CT abdomen pelvis March 19, 2014 FINDINGS: Lower chest: Bibasilar atelectasis or scarring. Hepatobiliary: Unremarkable noncontrast appearance of liver. Cholecystectomy. No biliary ductal dilatation. Pancreas: Unremarkable appearance of the pancreas without evidence of peripancreatic inflammation. Spleen: Normal in size without focal abnormality. Adrenals/Urinary Tract: Adrenal glands are unremarkable. Kidneys are normal, without renal calculi, focal lesion, or hydronephrosis. Bladder is decompressed limiting evaluation. Stomach/Bowel: Stomach is within normal limits. Appendix appears normal. No evidence of bowel wall thickening, distention, or inflammatory changes. Vascular/Lymphatic: Aortic atherosclerosis. No enlarged abdominal or pelvic lymph nodes. Reproductive: Uterus and bilateral adnexa are unremarkable. Other: No abdominal wall hernia or abnormality. No abdominopelvic ascites. Musculoskeletal: Thoracolumbar spondylosis. No acute osseous abnormality. IMPRESSION: 1. No acute abdominopelvic findings. Specifically, no evidence of nephrolithiasis or obstructive uropathy. 2. Aortic atherosclerosis. Aortic Atherosclerosis (ICD10-I70.0). Electronically Signed   By: Dahlia Bailiff MD   On: 04/17/2020 10:52    Microbiology: Recent Results (from the past 240 hour(s))  Respiratory Panel by RT PCR (Flu Hatsumi Steinhart&B, Covid) - Nasopharyngeal Swab     Status: None   Collection Time: 04/17/20  4:00 AM   Specimen: Nasopharyngeal Swab  Result Value Ref Range Status   SARS Coronavirus 2 by RT PCR NEGATIVE NEGATIVE Final    Comment: (NOTE) SARS-CoV-2 target nucleic acids are NOT DETECTED.  The SARS-CoV-2 RNA is generally detectable in upper respiratoy specimens during the acute phase of infection. The lowest concentration of SARS-CoV-2 viral copies this assay can detect is 131 copies/mL. Perle Brickhouse negative result does not preclude SARS-Cov-2 infection and should not  be used as the sole basis for treatment or other patient management decisions. Khadija Thier negative result may occur with  improper specimen collection/handling, submission of specimen other than nasopharyngeal swab, presence of viral mutation(s) within the areas targeted by this assay, and inadequate number of viral copies (<131 copies/mL). Gara Kincade negative result must be combined with clinical observations, patient history, and epidemiological information. The expected result is Negative.  Fact Sheet for Patients:  PinkCheek.be  Fact Sheet for Healthcare Providers:  GravelBags.it  This test is no t yet approved or cleared by the [place] and  has been authorized for detection and/or diagnosis of SARS-CoV-2 by FDA under an Emergency Use Authorization (EUA). This EUA will remain  in effect (meaning this test can be used) for the duration of the COVID-19 declaration under Section 564(b)(1) of the Act, 21 U.S.C. section 360bbb-3(b)(1), unless the authorization is terminated or revoked sooner.     Influenza Kadin Bera by PCR NEGATIVE NEGATIVE Final   Influenza B by PCR NEGATIVE NEGATIVE Final    Comment: (NOTE) The Xpert Xpress SARS-CoV-2/FLU/RSV assay is intended as an aid in  the diagnosis of influenza from Nasopharyngeal swab specimens and  should not be used as Tamasha Laplante sole basis for treatment. Nasal washings and  aspirates are unacceptable for Xpert Xpress SARS-CoV-2/FLU/RSV  testing.  Fact Sheet for Patients: PinkCheek.be  Fact Sheet for Healthcare Providers: GravelBags.it  This test is not yet approved or cleared by the [place] FDA and  has been authorized for detection and/or diagnosis of SARS-CoV-2 by  FDA under an Emergency Use Authorization (EUA). This EUA will remain  in effect (meaning this test can be used) for the duration of the  Covid-19 declaration under Section  564(b)(1) of the Act, 21  U.S.C. section 360bbb-3(b)(1), unless the authorization is  terminated or revoked. Performed at [facility], [place] [address] [place], [place] [address]      Labs: Basic Metabolic Panel: Recent Labs  Lab 04/17/20 0028 04/17/20 0550 04/18/20 1042  NA 135 135 139  K 3.4* 2.9* 3.9  CL 94* 98 107  CO2  --  24 24  GLUCOSE 228* 182* 193*  BUN 22 17 <5*  CREATININE 1.20* 0.83 0.61  CALCIUM  --  7.5* 8.1*   Liver Function Tests: Recent Labs  Lab 04/17/20 0550 04/18/20 1042  AST 25 16  ALT 27 24  ALKPHOS 51 54  BILITOT 1.0 0.5  PROT 5.9* 6.0*  ALBUMIN 3.3* 3.3*   No results for input(s): LIPASE, AMYLASE in the last 168 hours. Recent Labs  Lab 04/17/20 0550  AMMONIA 26   CBC: Recent Labs  Lab 04/16/20 2345 04/17/20 0028 04/17/20 0550 04/18/20 1042  WBC 13.2*  --  8.1 8.2  NEUTROABS 9.2*  --  5.3 5.8  HGB 14.7 16.0* 12.3 12.1  HCT 45.4 47.0* 37.6 38.5  MCV 91.7  --  90.8 93.0  PLT 281  --  237 263   Cardiac Enzymes: No results for input(s): CKTOTAL, CKMB, CKMBINDEX, TROPONINI in the last 168 hours. BNP: BNP (last 3 results) No results for input(s): BNP in the last 8760 hours.  ProBNP (last 3 results) No results for input(s): PROBNP in the last 8760 hours.  CBG: Recent Labs  Lab 04/17/20 1211 04/17/20 1803 04/17/20 2230 04/18/20 0742 04/18/20 1200  GLUCAP 164* 96 143* 169* 147*       Signed:  Fayrene Helper MD.  [facility] 04/18/2020, 4:01 PM

## 2020-04-18 NOTE — Evaluation (Signed)
Physical Therapy Evaluation Patient Details Name: [name] MRN: [id number] DOB: [birth year] Today's Date: 04/18/2020   History of Present Illness  [name] is a 59 y.o. female with PMH significant for Crohn's disease, DM, CAD s/p stenting. She was found to be increasingly lethargic since yesterday morning. Patient's husand came home in the evening on 11/18 and found patient was very lethargic an brought her to the ED. Labs suspicious for possible UTI.     Clinical Impression  [name] is 59 y.o. female admitted with above HPI and diagnosis. Patient is currently limited by functional impairments below (see PT problem list). Patient evaluated by Physical Therapy with no further acute PT needs identified. All education has been completed and the patient has no further questions. Patient is independent at baseline and currently is independent with transfers and supervision-independent level for gait with no device. Pt completed high level dynamic gait challenges with no LOB. She ha c/o weakness and may beneit from follow up OPPT. See below for any follow-up Physical Therapy or equipment needs. PT is signing off. Thank you for this referral.     Follow Up Recommendations Outpatient PT (possible OPPT follow up for strenthening if pt wants)    Equipment Recommendations  None recommended by PT    Recommendations for Other Services       Precautions / Restrictions Precautions Precautions: Fall Restrictions Weight Bearing Restrictions: No      Mobility  Bed Mobility Overal bed mobility: Independent                  Transfers Overall transfer level: Independent Equipment used: None             General transfer comment: Sit<>Stand from EOB, no cues or assist needed, pt steady.   Ambulation/Gait Ambulation/Gait assistance: Supervision Gait Distance (Feet): 360 Feet Assistive device: None Gait Pattern/deviations: Step-through pattern;WFL(Within Functional  Limits) Gait velocity: fair   General Gait Details: no overt LOB, pt steady with dynamic gait challenges.  Stairs            Wheelchair Mobility    Modified Rankin (Stroke Patients Only)       Balance Overall balance assessment: No apparent balance deficits (not formally assessed)                               Standardized Balance Assessment Standardized Balance Assessment : Dynamic Gait Index   Dynamic Gait Index Level Surface: Normal Change in Gait Speed: Normal Gait with Horizontal Head Turns: Normal Gait with Vertical Head Turns: Normal Gait and Pivot Turn: Normal Step Over Obstacle: Normal Step Around Obstacles: Normal Steps: Mild Impairment (not tested; anticipate use of hand rail) Total Score: 23       Pertinent Vitals/Pain Pain Assessment: No/denies pain    Home Living Family/patient expects to be discharged to:: Private residence Living Arrangements: Spouse/significant other Available Help at Discharge: Family Type of Home: House Home Access: Stairs to enter Entrance Stairs-Rails: Psychiatric nurse of Steps: 7 Home Layout: One level Home Equipment: None      Prior Function Level of Independence: Independent               Hand Dominance        Extremity/Trunk Assessment   Upper Extremity Assessment Upper Extremity Assessment: Overall WFL for tasks assessed    Lower Extremity Assessment Lower Extremity Assessment: Overall WFL for tasks assessed  Cervical / Trunk Assessment Cervical / Trunk Assessment: Normal  Communication   Communication: No difficulties  Cognition Arousal/Alertness: Awake/alert Behavior During Therapy: WFL for tasks assessed/performed Overall Cognitive Status: Within Functional Limits for tasks assessed                                        General Comments      Exercises     Assessment/Plan    PT Assessment Patent does not need any further PT  services  PT Problem List         PT Treatment Interventions      PT Goals (Current goals can be found in the Care Plan section)  Acute Rehab PT Goals Patient Stated Goal: go home PT Goal Formulation: With patient Time For Goal Achievement: 04/25/20 Potential to Achieve Goals: Good    Frequency     Barriers to discharge        Co-evaluation               AM-PAC PT "6 Clicks" Mobility  Outcome Measure Help needed turning from your back to your side while in a flat bed without using bedrails?: None Help needed moving from lying on your back to sitting on the side of a flat bed without using bedrails?: None Help needed moving to and from a bed to a chair (including a wheelchair)?: None Help needed standing up from a chair using your arms (e.g., wheelchair or bedside chair)?: None Help needed to walk in hospital room?: None Help needed climbing 3-5 steps with a railing? : A Little 6 Click Score: 23    End of Session Equipment Utilized During Treatment: Gait belt Activity Tolerance: Patient tolerated treatment well Patient left: in bed;with call bell/phone within reach;with nursing/sitter in room Nurse Communication: Mobility status PT Visit Diagnosis: Unsteadiness on feet (R26.81);Muscle weakness (generalized) (M62.81)    Time: 8527-7824 PT Time Calculation (min) (ACUTE ONLY): 13 min   Charges:   PT Evaluation $PT Eval Low Complexity: 1 Low          Verner Mould, DPT Acute Rehabilitation Services  Office [phone] Pager [phone]  04/18/2020 3:10 PM

## 2020-04-29 DIAGNOSIS — Z20822 Contact with and (suspected) exposure to covid-19: Secondary | ICD-10-CM | POA: Diagnosis not present

## 2020-04-29 DIAGNOSIS — Z03818 Encounter for observation for suspected exposure to other biological agents ruled out: Secondary | ICD-10-CM | POA: Diagnosis not present

## 2020-04-29 DIAGNOSIS — J069 Acute upper respiratory infection, unspecified: Secondary | ICD-10-CM | POA: Diagnosis not present

## 2020-04-29 DIAGNOSIS — R5381 Other malaise: Secondary | ICD-10-CM | POA: Diagnosis not present

## 2020-04-29 NOTE — ED Notes (Signed)
Per PCP, patient with multiple medical conditions-coming in for flu like symptoms-rapid flu done in office, waiting for results-covid completed and sent off to lab-CBG 225

## 2020-04-30 ENCOUNTER — Telehealth: Payer: Self-pay | Admitting: Gastroenterology

## 2020-04-30 ENCOUNTER — Ambulatory Visit: Payer: BC Managed Care – PPO | Admitting: Gastroenterology

## 2020-04-30 NOTE — Telephone Encounter (Signed)
Pt is requesting a call back from a nurse to discuss her inability to come in for an appt today, pt would like to know if she could be referred to a Rheumatologist.

## 2020-04-30 NOTE — Telephone Encounter (Signed)
Rescheduled her appointment. Recently in the hospital for urinary tract infection. She has been out of work a lot. No vacation or paid leave. She has rescheduled her appointment to January.  She also asks if you know of a rheumotologist who treats fibromyalgia. Dr Charlestine Night has retired. She needs a new doctor.

## 2020-04-30 NOTE — Telephone Encounter (Signed)
Ok, please refer her to either Dr. Estanislado Pandy or Dr Dossie Der at [facility] rheumatology.

## 2020-05-01 ENCOUNTER — Other Ambulatory Visit: Payer: Self-pay

## 2020-05-01 DIAGNOSIS — R0600 Dyspnea, unspecified: Secondary | ICD-10-CM

## 2020-05-01 DIAGNOSIS — I25119 Atherosclerotic heart disease of native coronary artery with unspecified angina pectoris: Secondary | ICD-10-CM

## 2020-05-01 DIAGNOSIS — R0609 Other forms of dyspnea: Secondary | ICD-10-CM

## 2020-05-01 DIAGNOSIS — E782 Mixed hyperlipidemia: Secondary | ICD-10-CM

## 2020-05-01 DIAGNOSIS — I10 Essential (primary) hypertension: Secondary | ICD-10-CM

## 2020-05-01 MED ORDER — METOPROLOL SUCCINATE ER 25 MG PO TB24: 25.0000 mg | ORAL_TABLET | Freq: Every day | ORAL | 0 refills | Status: DC

## 2020-05-01 NOTE — Telephone Encounter (Signed)
Patient advised. Also guided her to [facility] as an option for healthcare including fibromyalgia.

## 2020-05-02 DIAGNOSIS — M797 Fibromyalgia: Secondary | ICD-10-CM | POA: Diagnosis not present

## 2020-05-02 DIAGNOSIS — Z03818 Encounter for observation for suspected exposure to other biological agents ruled out: Secondary | ICD-10-CM | POA: Diagnosis not present

## 2020-05-02 DIAGNOSIS — I10 Essential (primary) hypertension: Secondary | ICD-10-CM | POA: Diagnosis not present

## 2020-05-14 DIAGNOSIS — Z01419 Encounter for gynecological examination (general) (routine) without abnormal findings: Secondary | ICD-10-CM | POA: Diagnosis not present

## 2020-05-14 DIAGNOSIS — Z1231 Encounter for screening mammogram for malignant neoplasm of breast: Secondary | ICD-10-CM | POA: Diagnosis not present

## 2020-05-15 DIAGNOSIS — Z01419 Encounter for gynecological examination (general) (routine) without abnormal findings: Secondary | ICD-10-CM | POA: Diagnosis not present

## 2020-05-28 ENCOUNTER — Telehealth: Payer: Self-pay | Admitting: Cardiology

## 2020-05-28 ENCOUNTER — Telehealth: Payer: Self-pay | Admitting: Gastroenterology

## 2020-05-28 MED ORDER — METFORMIN HCL 500 MG PO TABS
1000.0000 mg | ORAL_TABLET | Freq: Two times a day (BID) | ORAL | 0 refills | Status: DC
Start: 2020-05-28 — End: 2020-07-23

## 2020-05-28 NOTE — Telephone Encounter (Signed)
Pt called to inform that she was able to get her medications rf. Pls disregard the message below.

## 2020-05-28 NOTE — Telephone Encounter (Signed)
Noted thank you

## 2020-05-28 NOTE — Telephone Encounter (Signed)
Pt c/o medication issue:  1. Name of Medication: metFORMIN (GLUCOPHAGE) 500 MG tablet  2. How are you currently taking this medication (dosage and times per day)? 1000 mg 2x daily  3. Are you having a reaction (difficulty breathing--STAT)? no  4. What is your medication issue? Patient said she is not able to get the medication from her PCP. She needs Dr. Meda Coffee to write her a rx for this medication. Please send refills to pharmacy noted below   *STAT* If patient is at the pharmacy, call can be transferred to refill team.   1. Which medications need to be refilled? (please list name of each medication and dose if known)  metFORMIN (GLUCOPHAGE) 500 MG tablet  2. Which pharmacy/location (including street and city if local pharmacy) is medication to be sent to? [facility], [place]  3. Do they need a 30 day or 90 day supply? 90 with refills

## 2020-05-28 NOTE — Telephone Encounter (Signed)
Patient called regarding PCP not wanting to refill any of her medications. Please advise

## 2020-05-28 NOTE — Telephone Encounter (Signed)
Refill dept sent in requested med to pts pharmacy on file, with notation that further refills should come from her PCP.

## 2020-05-28 NOTE — Telephone Encounter (Signed)
Refills please refill for a month and pt must obtain further refills from PCP.

## 2020-05-28 NOTE — Telephone Encounter (Signed)
Left message on machine to call back  

## 2020-06-04 DIAGNOSIS — K50813 Crohn's disease of both small and large intestine with fistula: Secondary | ICD-10-CM | POA: Diagnosis not present

## 2020-06-04 DIAGNOSIS — Z79899 Other long term (current) drug therapy: Secondary | ICD-10-CM | POA: Diagnosis not present

## 2020-06-09 DIAGNOSIS — M549 Dorsalgia, unspecified: Secondary | ICD-10-CM | POA: Diagnosis not present

## 2020-06-11 ENCOUNTER — Other Ambulatory Visit: Payer: Self-pay

## 2020-06-11 ENCOUNTER — Encounter: Payer: Self-pay | Admitting: Physician Assistant

## 2020-06-11 ENCOUNTER — Ambulatory Visit: Payer: BC Managed Care – PPO | Admitting: Physician Assistant

## 2020-06-11 VITALS — BP 128/74 | HR 77 | Ht 63.0 in | Wt 141.0 lb

## 2020-06-11 DIAGNOSIS — E119 Type 2 diabetes mellitus without complications: Secondary | ICD-10-CM

## 2020-06-11 DIAGNOSIS — M5136 Other intervertebral disc degeneration, lumbar region: Secondary | ICD-10-CM | POA: Diagnosis not present

## 2020-06-11 DIAGNOSIS — I2510 Atherosclerotic heart disease of native coronary artery without angina pectoris: Secondary | ICD-10-CM | POA: Diagnosis not present

## 2020-06-11 DIAGNOSIS — E785 Hyperlipidemia, unspecified: Secondary | ICD-10-CM | POA: Diagnosis not present

## 2020-06-11 DIAGNOSIS — I10 Essential (primary) hypertension: Secondary | ICD-10-CM | POA: Diagnosis not present

## 2020-06-11 DIAGNOSIS — M4696 Unspecified inflammatory spondylopathy, lumbar region: Secondary | ICD-10-CM | POA: Diagnosis not present

## 2020-06-11 DIAGNOSIS — Z794 Long term (current) use of insulin: Secondary | ICD-10-CM

## 2020-06-11 DIAGNOSIS — M5459 Other low back pain: Secondary | ICD-10-CM | POA: Diagnosis not present

## 2020-06-11 NOTE — Patient Instructions (Addendum)
Medication Instructions:  Your physician recommends that you continue on your current medications as directed. Please refer to the Current Medication list given to you today.  *If you need a refill on your cardiac medications before your next appointment, please call your pharmacy*   Lab Work: None ordered  If you have labs (blood work) drawn today and your tests are completely normal, you will receive your results only by: Marland Kitchen MyChart Message (if you have MyChart) OR . A paper copy in the mail If you have any lab test that is abnormal or we need to change your treatment, we will call you to review the results.   Testing/Procedures: None ordered   Follow-Up: At [facility], you and your health needs are our priority.  As part of our continuing mission to provide you with exceptional heart care, we have created designated Provider Care Teams.  These Care Teams include your primary Cardiologist (physician) and Advanced Practice Providers (APPs -  Physician Assistants and Nurse Practitioners) who all work together to provide you with the care you need, when you need it.  We recommend signing up for the patient portal called "MyChart".  Sign up information is provided on this After Visit Summary.  MyChart is used to connect with patients for Virtual Visits (Telemedicine).  Patients are able to view lab/test results, encounter notes, upcoming appointments, etc.  Non-urgent messages can be sent to your provider as well.   To learn more about what you can do with MyChart, go to [email or web address].    Your next appointment:   12 month(s)  The format for your next appointment:   In Person  Provider:   You may see Ena Dawley, MD or one of the following Advanced Practice Providers on your designated Care Team:    Melina Copa, PA-C  Ermalinda Barrios, PA-C    Other Instructions
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
## 2020-06-13 DIAGNOSIS — M545 Low back pain, unspecified: Secondary | ICD-10-CM | POA: Diagnosis not present

## 2020-06-18 ENCOUNTER — Ambulatory Visit (INDEPENDENT_AMBULATORY_CARE_PROVIDER_SITE_OTHER): Payer: BC Managed Care – PPO | Admitting: Gastroenterology

## 2020-06-18 ENCOUNTER — Ambulatory Visit: Payer: BC Managed Care – PPO | Admitting: Gastroenterology

## 2020-06-18 ENCOUNTER — Other Ambulatory Visit (INDEPENDENT_AMBULATORY_CARE_PROVIDER_SITE_OTHER): Payer: BC Managed Care – PPO

## 2020-06-18 ENCOUNTER — Encounter: Payer: Self-pay | Admitting: Gastroenterology

## 2020-06-18 ENCOUNTER — Other Ambulatory Visit: Payer: Self-pay

## 2020-06-18 VITALS — BP 102/62 | HR 85 | Ht 62.0 in | Wt 139.0 lb

## 2020-06-18 DIAGNOSIS — R1013 Epigastric pain: Secondary | ICD-10-CM

## 2020-06-18 DIAGNOSIS — K5080 Crohn's disease of both small and large intestine without complications: Secondary | ICD-10-CM

## 2020-06-18 DIAGNOSIS — R197 Diarrhea, unspecified: Secondary | ICD-10-CM

## 2020-06-18 DIAGNOSIS — R1032 Left lower quadrant pain: Secondary | ICD-10-CM

## 2020-06-18 DIAGNOSIS — K921 Melena: Secondary | ICD-10-CM | POA: Diagnosis not present

## 2020-06-18 LAB — CBC WITH DIFFERENTIAL/PLATELET
Basophils Absolute: 0.1 10*3/uL (ref 0.0–0.1)
Basophils Relative: 0.4 % (ref 0.0–3.0)
Eosinophils Absolute: 0.1 10*3/uL (ref 0.0–0.7)
Eosinophils Relative: 0.9 % (ref 0.0–5.0)
HCT: 42.3 % (ref 36.0–46.0)
Hemoglobin: 14 g/dL (ref 12.0–15.0)
Lymphocytes Relative: 31.4 % (ref 12.0–46.0)
Lymphs Abs: 4.3 10*3/uL — ABNORMAL HIGH (ref 0.7–4.0)
MCHC: 33 g/dL (ref 30.0–36.0)
MCV: 87.1 fl (ref 78.0–100.0)
Monocytes Absolute: 0.8 10*3/uL (ref 0.1–1.0)
Monocytes Relative: 6.1 % (ref 3.0–12.0)
Neutro Abs: 8.3 10*3/uL — ABNORMAL HIGH (ref 1.4–7.7)
Neutrophils Relative %: 61.2 % (ref 43.0–77.0)
Platelets: 403 10*3/uL — ABNORMAL HIGH (ref 150.0–400.0)
RBC: 4.85 Mil/uL (ref 3.87–5.11)
RDW: 15.7 % — ABNORMAL HIGH (ref 11.5–15.5)
WBC: 13.6 10*3/uL — ABNORMAL HIGH (ref 4.0–10.5)

## 2020-06-18 LAB — VITAMIN B12: Vitamin B-12: 259 pg/mL (ref 211–911)

## 2020-06-18 LAB — COMPREHENSIVE METABOLIC PANEL
ALT: 28 U/L (ref 0–35)
AST: 22 U/L (ref 0–37)
Albumin: 4.4 g/dL (ref 3.5–5.2)
Alkaline Phosphatase: 88 U/L (ref 39–117)
BUN: 20 mg/dL (ref 6–23)
CO2: 26 mEq/L (ref 19–32)
Calcium: 10 mg/dL (ref 8.4–10.5)
Chloride: 100 mEq/L (ref 96–112)
Creatinine, Ser: 0.66 mg/dL (ref 0.40–1.20)
GFR: 94.28 mL/min (ref >60.00)
Glucose, Bld: 187 mg/dL — ABNORMAL HIGH (ref 70–99)
Potassium: 4.1 mEq/L (ref 3.5–5.1)
Sodium: 138 mEq/L (ref 135–145)
Total Bilirubin: 0.4 mg/dL (ref 0.2–1.2)
Total Protein: 6.8 g/dL (ref 6.0–8.3)

## 2020-06-18 LAB — SEDIMENTATION RATE: Sed Rate: 31 mm/hr — ABNORMAL HIGH (ref 0–30)

## 2020-06-18 LAB — VITAMIN D 25 HYDROXY (VIT D DEFICIENCY, FRACTURES): VITD: 38.85 ng/mL (ref 30.00–100.00)

## 2020-06-18 MED ORDER — CHOLESTYRAMINE LIGHT 4 G PO PACK: 4.0000 g | PACK | Freq: Every day | ORAL | 3 refills | Status: DC

## 2020-06-18 NOTE — Progress Notes (Unsigned)
         [name]    [id number]    02/12/1961  Primary Care Physician:[facility]  Referring Physician: [facility] [address] [place],  [place] [address]   Chief complaint: Crohn's disease HPI:  [name] is a 60-year-old very pleasant female with history of Crohn's disease on Entyvio for maintenance therapy here for follow-up visit for evaluation of recent onset diarrhea and abdominal pain  She noted LLQ abdominal pain in the past 2 months.  Multiple bowel movements with intermittent dark tarry stool, on average she is having 3-4 bowel movements daily.  No rectal bleeding. She has been on maintenance therapy with Entyvio infusion and also taking budesonide 9 mg daily.  Unclear if she had noticed any improvement with budesonide  She is also taking colestipol for possible bile salt induced diarrhea  Complains of epigastric abdominal pain, took Pepto-Bismol with some improvement.  She was having dark stool even before she took the Pepto-Bismol.  Denies any NSAIDs  She had NSTEMI in November 2018, status post coronary stent placement (DES)  Colonoscopy December 05, 2018:Terminal ileal biopsies consistent with Crohn's disease. IC valve polypoid lesion status post EMR, biopsies consistent with IBD negative for dysplasia or adenomatous tissue 5 mm polyp, tubular adenoma removed from transverse colon otherwise normal colon on chromoendoscopy  Colonoscopy July 07, 2018 Nodular TI biopsied Localized nodular polypoid mucosa in IC valve biopsied, ?Adenoma on pathology report, requested additional review is pending.  EGD 08/22/2009: Esophagitis otherwise unremarkable exam   MRI pelvis February 14, 2018 showed grade 1 simple linear intersphincteric perianal fistula without associated abscess. Mild segmental wall thickening and luminal narrowing in the distal ileum suggestive of mild active Crohn's ileitis. Mild chronic wall thickening in the terminal ileum  without active inflammatory changes.  Relevant GI history: Crohn's disease with predominant small bowel involvement diagnosed in 1991. Initially was managed with prednisone, had significant side effects. Subsequently was in clinical remission on 6-MP. 6-MP was discontinued in 2017 due to persistently elevated transaminases, have improved after discontinuing 6-MP. She was started on Humira, clinically failed with persistent symptoms. Had undetectable drug trough with elevated antibodies. Started on budesonide 9 mg daily and taper down to 3 mg daily in 2018. Developed rectal pain and noted to have perianal fistula on MRI pelvis September 2019.   Outpatient Encounter Medications as of 06/18/2020  Medication Sig  . albuterol (PROVENTIL) (2.5 MG/3ML) 0.083% nebulizer solution Take 2.5 mg by nebulization every 4 (four) hours as needed for wheezing or shortness of breath.   . aspirin 81 MG chewable tablet Chew 81 mg by mouth daily.  . budesonide (ENTOCORT EC) 3 MG 24 hr capsule Take 3 capsules (9 mg total) by mouth daily.  . busPIRone (BUSPAR) 10 MG tablet Take 10 mg by mouth 2 (two) times daily.  . colestipol (COLESTID) 1 g tablet Take 1 tablet (1 g total) by mouth 2 (two) times daily. As needed  . doxycycline (VIBRA-TABS) 100 MG tablet Take 100 mg by mouth 2 (two) times daily as needed. Flare ups  . DULoxetine (CYMBALTA) 60 MG capsule Take 60 mg by mouth daily.  . enalapril (VASOTEC) 5 MG tablet Take 1 tablet (5 mg total) by mouth daily.  . EPIPEN 2-PAK 0.3 MG/0.3ML SOAJ injection Inject 0.3 mg into the muscle as needed for anaphylaxis. Reported on 06/27/2015  . Evolocumab (REPATHA SURECLICK) 140 MG/ML SOAJ Inject 1 pen into the skin every 14 (fourteen) days.  .   fenofibrate (TRICOR) 145 MG tablet TAKE ONE TABLET BY MOUTH ONCE DAILY  . furosemide (LASIX) 40 MG tablet Take 1 tablet (40 mg total) by mouth daily as needed for fluid or edema.  Marland Kitchen glimepiride (AMARYL) 2 MG tablet Take 2 mg by mouth 2  (two) times a day.  . insulin glargine (LANTUS) 100 UNIT/ML injection Inject 0.1 mLs (10 Units total) into the skin at bedtime.  Marland Kitchen levocetirizine (XYZAL) 5 MG tablet Take 5 mg by mouth every evening.  . loratadine (CLARITIN) 10 MG tablet Take 10 mg by mouth daily.  . metFORMIN (GLUCOPHAGE) 500 MG tablet Take 2 tablets (1,000 mg total) by mouth 2 (two) times daily with a meal.  . metoprolol succinate (TOPROL XL) 25 MG 24 hr tablet Take 1 tablet (25 mg total) by mouth daily. Please make yearly appt with Dr. Meda Coffee for January 2022 for future refills. Thank you 1st attempt  . montelukast (SINGULAIR) 10 MG tablet Take 10 mg by mouth at bedtime.  . Multiple Vitamins-Minerals (MULTIVITAMIN WITH MINERALS) tablet Take 1 tablet by mouth daily.  . nitroGLYCERIN (NITROSTAT) 0.4 MG SL tablet Place 0.4 mg under the tongue every 5 (five) minutes as needed for chest pain.  . pantoprazole (PROTONIX) 40 MG tablet Take 1 tablet (40 mg total) by mouth daily.  . pioglitazone (ACTOS) 30 MG tablet Take 30 mg by mouth daily.  . Probiotic Product (PROBIOTIC DAILY PO) Take 1 capsule by mouth daily.  Marland Kitchen VASCEPA 1 g capsule Take 2 capsules (2 g total) by mouth 2 (two) times daily.  . vedolizumab (ENTYVIO) 300 MG injection Inject 300 mg into the vein every 8 (eight) weeks. After the standard induction  . Vitamin D3 (VITAMIN D) 25 MCG tablet Take 1,000 Units by mouth at bedtime.   Facility-Administered Encounter Medications as of 06/18/2020  Medication  . nitroGLYCERIN (NITROSTAT) SL tablet 0.3 mg    Allergies as of 06/18/2020 - Review Complete 06/11/2020  Allergen Reaction Noted  . Lipitor [atorvastatin] Other (See Comments) 03/28/2013  . Remicade [infliximab]    . Ticagrelor  09/16/2017  . Rosuvastatin Other (See Comments) 01/25/2019    Past Medical History:  Diagnosis Date  . Allergic rhinitis due to pollen   . Allergy   . Anxiety state, unspecified   . Arthritis   . Bronchitis    hx  . CAD (coronary artery  disease)    s/p NSTEMI in 11/18 treated with a DES to the proximal LAD  . Calculus of gallbladder without mention of cholecystitis or obstruction   . Crohn disease ([place])   . Depressive disorder, not elsewhere classified   . Diabetes mellitus without complication (HCC)    diet controlled  . Esophageal reflux   . Fibromyalgia   . Grade I diastolic dysfunction    Noted on ECHO  . Headache   . History of nuclear stress test    ETT-Myoview 10/17: EF 55%, normal perfusion; Low Risk // Nuclear stress test 05/2018:  EF 63, normal perfusion; Low Risk  . Myalgia and myositis, unspecified   . NSTEMI (non-ST elevated myocardial infarction) ([place]) 04/15/2017  . Other and unspecified hyperlipidemia   . Palpitations   . Pneumonia 13   hx  . S/P angioplasty with stent 04/18/17 with DES to pLAD 04/19/2017  . Seizures ([place])    1 seizure as a teenager   . Unspecified essential hypertension   . Vitamin B deficiency     Past Surgical History:  Procedure Laterality Date  .  BIOPSY  12/05/2018   Procedure: BIOPSY;  Surgeon: Nandigam, Kavitha V, MD;  Location: [facility];  Service: Endoscopy;;  . CHOLECYSTECTOMY N/A 09/19/2014   Procedure: LAPAROSCOPIC CHOLECYSTECTOMY WITH INTRAOPERATIVE CHOLANGIOGRAM;  Surgeon: Matthew Tsuei, MD;  Location: [facility] OR;  Service: General;  Laterality: N/A;  . COLONOSCOPY WITH PROPOFOL N/A 12/05/2018   Procedure: COLONOSCOPY WITH PROPOFOL;  Surgeon: Nandigam, Kavitha V, MD;  Location: [facility];  Service: Endoscopy;  Laterality: N/A;  chromoendoscopy  . CORONARY STENT INTERVENTION N/A 04/18/2017   Procedure: CORONARY STENT INTERVENTION;  Surgeon: Jordan, Peter M, MD;  Location: [facility];  Service: Cardiovascular;  Laterality: N/A;  . ESOPHAGOGASTRODUODENOSCOPY    . EXPLORATORY LAPAROTOMY     for endometriosis  . INTRAVASCULAR PRESSURE WIRE/FFR STUDY N/A 04/18/2017   Procedure: INTRAVASCULAR PRESSURE WIRE/FFR STUDY;  Surgeon: Jordan, Peter M, MD;  Location: [facility]  INVAS[facility];  Service: Cardiovascular;  Laterality: N/A;  . LEFT HEART CATH AND CORONARY ANGIOGRAPHY N/A 04/18/2017   Procedure: LEFT HEART CATH AND CORONARY ANGIOGRAPHY;  Surgeon: Jordan, Peter M, MD;  Location: [facility];  Service: Cardiovascular;  Laterality: N/A;  . POLYPECTOMY  12/05/2018   Procedure: POLYPECTOMY;  Surgeon: Nandigam, Kavitha V, MD;  Location: [facility];  Service: Endoscopy;;  . SUBMUCOSAL LIFTING INJECTION  12/05/2018   Procedure: SUBMUCOSAL LIFTING INJECTION;  Surgeon: Nandigam, Kavitha V, MD;  Location: [facility];  Service: Endoscopy;;  . TONSILLECTOMY AND ADENOIDECTOMY    . TUBAL LIGATION     BILATERAL    Family History  Problem Relation Age of Onset  . Lung cancer Maternal Grandmother   . Stroke Father   . Hypertension Mother   . Heart attack Mother   . Heart disease Mother   . Hypertension Brother   . Heart disease Brother   . Hypertension Sister   . Heart disease Sister   . Coronary artery disease Brother   . Colon cancer Neg Hx   . Stomach cancer Neg Hx   . Esophageal cancer Neg Hx   . Rectal cancer Neg Hx     Social History   Socioeconomic History  . Marital status: Married    Spouse name: Dane  . Number of children: 2  . Years of education: 12+  . Highest education level: Not on file  Occupational History  . Occupation: ACCOUNT REP.    Employer: [employer]  Tobacco Use  . Smoking status: Former Smoker    Packs/day: 1.00    Years: 35.00    Pack years: 35.00    Types: Cigarettes    Quit date: 05/31/2004    Years since quitting: 16.0  . Smokeless tobacco: Never Used  Vaping Use  . Vaping Use: Never used  Substance and Sexual Activity  . Alcohol use: No  . Drug use: No  . Sexual activity: Not Currently    Comment: married  Other Topics Concern  . Not on file  Social History Narrative   Lives with her husband and their pets. Her children are adults and live independently.   Social Determinants of  Health   Financial Resource Strain: Not on file  Food Insecurity: Not on file  Transportation Needs: Not on file  Physical Activity: Not on file  Stress: Not on file  Social Connections: Not on file  Intimate Partner Violence: Not on file      Review of systems: All other review of systems negative except as mentioned in the HPI.   Physical Exam:   Vitals:   06/18/20 1322  BP: 102/62  Pulse: 85  SpO2: 97%   Body mass index is 25.42 kg/m. Gen:      No acute distress HEENT:  sclera anicteric Abd:      soft, non-tender; no palpable masses, no distension Ext:    No edema Neuro: alert and oriented x 3 Psych: normal mood and affect  Data Reviewed:  Reviewed labs, radiology imaging, old records and pertinent past GI work up   Assessment and Plan/Recommendations:  60 year old very pleasant female with history of Crohn's disease and CAD with complaints of left lower quadrant abdominal pain, increased bowel frequency and dark tarry stool  Melena: Check CBC We will schedule for EGD to exclude peptic ulcer disease or gastroduodenitis The risks and benefits as well as alternatives of endoscopic procedure(s) have been discussed and reviewed. All questions answered. The patient agrees to proceed.   Crohn's disease: Continue Entyvio every 8 weeks for maintenance Check CMP, iron panel, ferritin, B12, folate, vitamin D, ESR and TB QuantiFERON gold for IBD maintenance We will taper off budesonide, unclear if it is helping or not at this point  Bile salt induced diarrhea: Switch to Prevalite 1 packet daily  If she continues to have persistent diarrhea, will plan for colonoscopy for further evaluation  GERD: Symptoms stable continue daily Protonix and antireflux measures  This visit required 40 minutes of patient care (this includes precharting, chart review, review of results, face-to-face time used for counseling as well as treatment plan and follow-up. The patient was provided an  opportunity to ask questions and all were answered. The patient agreed with the plan and demonstrated an understanding of the instructions.  Damaris Hippo , MD    CC: [place]

## 2020-06-18 NOTE — Patient Instructions (Addendum)
Your provider has requested that you go to the basement level for lab work before leaving today. Press "B" on the elevator. The lab is located at the first door on the left as you exit the elevator.  You have been scheduled for an endoscopy. Please follow written instructions given to you at your visit today. If you use inhalers (even only as needed), please bring them with you on the day of your procedure.   Continue Protonix  Taper Budesonide 6 mg daily x 1 week then decrease to 3 mg daily for 1 week then STOP  We will send Prevalite and Protonix  To your mail order pharmacy  Due to recent changes in healthcare laws, you may see the results of your imaging and laboratory studies on MyChart before your provider has had a chance to review them.  We understand that in some cases there may be results that are confusing or concerning to you. Not all laboratory results come back in the same time frame and the provider may be waiting for multiple results in order to interpret others.  Please give [place] 48 hours in order for your provider to thoroughly review all the results before contacting the office for clarification of your results.   I appreciate the  opportunity to care for you  Thank You   Harl Bowie , MD

## 2020-06-19 ENCOUNTER — Other Ambulatory Visit: Payer: BC Managed Care – PPO

## 2020-06-19 ENCOUNTER — Encounter: Payer: Self-pay | Admitting: Gastroenterology

## 2020-06-19 DIAGNOSIS — K5080 Crohn's disease of both small and large intestine without complications: Secondary | ICD-10-CM

## 2020-06-19 DIAGNOSIS — K921 Melena: Secondary | ICD-10-CM | POA: Diagnosis not present

## 2020-06-19 DIAGNOSIS — R197 Diarrhea, unspecified: Secondary | ICD-10-CM | POA: Diagnosis not present

## 2020-06-19 DIAGNOSIS — R1032 Left lower quadrant pain: Secondary | ICD-10-CM | POA: Diagnosis not present

## 2020-06-19 DIAGNOSIS — R1013 Epigastric pain: Secondary | ICD-10-CM

## 2020-06-20 LAB — QUANTIFERON-TB GOLD PLUS
Mitogen-NIL: 3.74 IU/mL
NIL: 0.02 IU/mL
QuantiFERON-TB Gold Plus: NEGATIVE
TB1-NIL: 0 IU/mL
TB2-NIL: 0.01 IU/mL

## 2020-06-20 LAB — CLOSTRIDIUM DIFFICILE BY PCR: Toxigenic C. Difficile by PCR: NEGATIVE

## 2020-07-02 DIAGNOSIS — M545 Low back pain, unspecified: Secondary | ICD-10-CM | POA: Diagnosis not present

## 2020-07-07 ENCOUNTER — Other Ambulatory Visit: Payer: Self-pay | Admitting: Gastroenterology

## 2020-07-07 DIAGNOSIS — Z1159 Encounter for screening for other viral diseases: Secondary | ICD-10-CM | POA: Diagnosis not present

## 2020-07-07 LAB — SARS CORONAVIRUS 2 (TAT 6-24 HRS): SARS Coronavirus 2: NEGATIVE

## 2020-07-08 ENCOUNTER — Telehealth: Payer: Self-pay | Admitting: Gastroenterology

## 2020-07-08 NOTE — Telephone Encounter (Signed)
Ok, if symptoms are worse, she should recheck Covid test. Thanks

## 2020-07-08 NOTE — Telephone Encounter (Signed)
Spoke with the patient.  Complains of body aches, elevated BP and pounding heart. She has been lying down for an hour. She agrees to take her BP for me now.  BP 174/106 down from 175/111  Pulse 105, previously 107. She states she does have diarrhea, no appetite, sinus drainage and cough. Scheduled for EGD tomorrow. Will postpone her procedure. She agrees to call her cardiologist office now due to the elevated vital signs. Is this okay?

## 2020-07-09 ENCOUNTER — Encounter: Payer: BC Managed Care – PPO | Admitting: Gastroenterology

## 2020-07-10 ENCOUNTER — Telehealth: Payer: Self-pay | Admitting: Cardiology

## 2020-07-10 NOTE — Telephone Encounter (Signed)
Called patient back about her message. Made patient an appointment for Monday to see Dr. Johney Frame. Patient stated she does have some swelling in her hands and feet. Encouraged patient to take her Lasix 40 mg by mouth daily as needed for edema. Informed patient that this will help with her edema and her BP until she can see the doctor on Monday. Patient verbalized understanding and will keep track of her BP.

## 2020-07-10 NOTE — Telephone Encounter (Signed)
    Pt c/o BP issue: STAT if pt c/o blurred vision, one-sided weakness or slurred speech  1. What are your last 5 BP readings? 178/111 HR 105 - Tuesday 143/78 - Today  2. Are you having any other symptoms (ex. Dizziness, headache, blurred vision, passed out)? fatigue  3. What is your BP issue? Pt said last today she's been having elevated BP, she made an appt with Dr. Johney Frame on 02/21 and would like to know what she can do while waiting for her appt

## 2020-07-13 NOTE — Progress Notes (Signed)
Cardiology Office Note:    Date:  07/14/2020   ID:  [name], DOB February 28, 1961, MRN [id number]  PCP:  [facility], [facility]  Cardiologist:  Freada Bergeron, MD Advanced Practice Provider:  No care team member to display Electrophysiologist:  None   Referring MD: [facility], [facility]    History of Present Illness:    [name] is a 60 y.o. female with a hx of CAD s/p DES to pLAD in 03/2017, HLD,  palpitations, HTN, DM and Crohn's dz seen for follow up.   Was last seen by Robbie Lis, PA on 06/11/20 for her yearly check-up. She was doing well at that time with no changes in medication. Had previously seen Dr. Meda Coffee for chest pain where [facility] 07/2019 was low risk without ischemia or scar.  She now presents today for fluctuating blood pressures as well as intermittent episodes of shortness of breath. Patient states she is having highs with 180s/110s but no significant lows. Feels very fatigued at the time and states she usually does not struggle with her blood pressure on her current regimen of enalapril and metoprolol. Notably, she has a wrist cuff.  Currently, taking all blood pressure medication at night. Denies any headaches, chest pain but has total body aches, extreme fatigue, and episodes of shortness of breath.   She is currently out of her repatha and has not taken it for at least 2 weeks. She is also concerned about her insulin and is interested in see Endocrinology. She would also like to see a Rheumatologist due to concern for fibromyalgia and total body aches.   Past Medical History:  Diagnosis Date  . Allergic rhinitis due to pollen   . Allergy   . Anxiety state, unspecified   . Arthritis   . Bronchitis    hx  . CAD (coronary artery disease)    s/p NSTEMI in 11/18 treated with a DES to the proximal LAD  . Calculus of gallbladder without mention of cholecystitis or obstruction   . Crohn disease ([place])   . Depressive disorder, not  elsewhere classified   . Diabetes mellitus without complication (HCC)    diet controlled  . Esophageal reflux   . Fibromyalgia   . Grade I diastolic dysfunction    Noted on ECHO  . Headache   . History of nuclear stress test    ETT-Myoview 10/17: EF 55%, normal perfusion; Low Risk // Nuclear stress test 05/2018:  EF 63, normal perfusion; Low Risk  . Myalgia and myositis, unspecified   . NSTEMI (non-ST elevated myocardial infarction) ([place]) 04/15/2017  . Other and unspecified hyperlipidemia   . Palpitations   . Pneumonia 13   hx  . S/P angioplasty with stent 04/18/17 with DES to pLAD 04/19/2017  . Seizures ([place])    1 seizure as a teenager   . Unspecified essential hypertension   . Vitamin B deficiency     Past Surgical History:  Procedure Laterality Date  . BIOPSY  12/05/2018   Procedure: BIOPSY;  Surgeon: Mauri Pole, MD;  Location: [facility];  Service: Endoscopy;;  . CHOLECYSTECTOMY N/A 09/19/2014   Procedure: LAPAROSCOPIC CHOLECYSTECTOMY WITH INTRAOPERATIVE CHOLANGIOGRAM;  Surgeon: Donnie Mesa, MD;  Location: [place] [facility];  Service: General;  Laterality: N/A;  . COLONOSCOPY WITH PROPOFOL N/A 12/05/2018   Procedure: COLONOSCOPY WITH PROPOFOL;  Surgeon: Mauri Pole, MD;  Location: [facility];  Service: Endoscopy;  Laterality: N/A;  chromoendoscopy  . CORONARY  STENT INTERVENTION N/A 04/18/2017   Procedure: CORONARY STENT INTERVENTION;  Surgeon: Martinique, Peter M, MD;  Location: [place];  Service: Cardiovascular;  Laterality: N/A;  . ESOPHAGOGASTRODUODENOSCOPY    . EXPLORATORY LAPAROTOMY     for endometriosis  . INTRAVASCULAR PRESSURE WIRE/FFR STUDY N/A 04/18/2017   Procedure: INTRAVASCULAR PRESSURE WIRE/FFR STUDY;  Surgeon: Martinique, Peter M, MD;  Location: [place] [facility];  Service: Cardiovascular;  Laterality: N/A;  . LEFT HEART CATH AND CORONARY ANGIOGRAPHY N/A 04/18/2017   Procedure: LEFT HEART CATH AND CORONARY ANGIOGRAPHY;  Surgeon: Martinique, Peter M, MD;   Location: [place] [facility];  Service: Cardiovascular;  Laterality: N/A;  . POLYPECTOMY  12/05/2018   Procedure: POLYPECTOMY;  Surgeon: Mauri Pole, MD;  Location: [facility];  Service: Endoscopy;;  . SUBMUCOSAL LIFTING INJECTION  12/05/2018   Procedure: SUBMUCOSAL LIFTING INJECTION;  Surgeon: Mauri Pole, MD;  Location: [facility];  Service: Endoscopy;;  . TONSILLECTOMY AND ADENOIDECTOMY    . TUBAL LIGATION     BILATERAL    Current Medications: Current Meds  Medication Sig  . albuterol (PROVENTIL) (2.5 MG/3ML) 0.083% nebulizer solution Take 2.5 mg by nebulization every 4 (four) hours as needed for wheezing or shortness of breath.   Marland Kitchen aspirin 81 MG chewable tablet Chew 81 mg by mouth daily.  . busPIRone (BUSPAR) 10 MG tablet Take 10 mg by mouth 2 (two) times daily.  . cholestyramine light (PREVALITE) 4 g packet Take 1 packet (4 g total) by mouth daily.  . colestipol (COLESTID) 1 g tablet Take 1 tablet (1 g total) by mouth 2 (two) times daily. As needed  . doxycycline (VIBRA-TABS) 100 MG tablet Take 100 mg by mouth 2 (two) times daily as needed. Flare ups  . DULoxetine (CYMBALTA) 60 MG capsule Take 60 mg by mouth daily.  . enalapril (VASOTEC) 5 MG tablet Take 1 tablet (5 mg total) by mouth daily.  Marland Kitchen EPIPEN 2-PAK 0.3 MG/0.3ML SOAJ injection Inject 0.3 mg into the muscle as needed for anaphylaxis. Reported on 06/27/2015  . Evolocumab (REPATHA SURECLICK) 371 MG/ML SOAJ Inject 1 pen into the skin every 14 (fourteen) days.  . fenofibrate (TRICOR) 145 MG tablet TAKE ONE TABLET BY MOUTH ONCE DAILY  . furosemide (LASIX) 40 MG tablet Take 1 tablet (40 mg total) by mouth daily as needed for fluid or edema.  Marland Kitchen glimepiride (AMARYL) 2 MG tablet Take 2 mg by mouth 2 (two) times a day.  . insulin glargine (LANTUS) 100 UNIT/ML injection Inject 0.1 mLs (10 Units total) into the skin at bedtime.  Marland Kitchen levocetirizine (XYZAL) 5 MG tablet Take 5 mg by mouth every evening.  . loratadine (CLARITIN)  10 MG tablet Take 10 mg by mouth daily.  . metFORMIN (GLUCOPHAGE) 500 MG tablet Take 2 tablets (1,000 mg total) by mouth 2 (two) times daily with a meal.  . metoprolol succinate (TOPROL XL) 25 MG 24 hr tablet Take 1 tablet (25 mg total) by mouth daily. Please make yearly appt with Dr. Meda Coffee for January 2022 for future refills. Thank you 1st attempt  . montelukast (SINGULAIR) 10 MG tablet Take 10 mg by mouth at bedtime.  . Multiple Vitamins-Minerals (MULTIVITAMIN WITH MINERALS) tablet Take 1 tablet by mouth daily.  . nitroGLYCERIN (NITROSTAT) 0.4 MG SL tablet Place 0.4 mg under the tongue every 5 (five) minutes as needed for chest pain.  . pantoprazole (PROTONIX) 40 MG tablet Take 1 tablet (40 mg total) by mouth daily.  . pioglitazone (ACTOS)  30 MG tablet Take 30 mg by mouth daily.  . Probiotic Product (PROBIOTIC DAILY PO) Take 1 capsule by mouth daily.  Marland Kitchen VASCEPA 1 g capsule Take 2 capsules (2 g total) by mouth 2 (two) times daily.  . vedolizumab (ENTYVIO) 300 MG injection Inject 300 mg into the vein every 8 (eight) weeks. After the standard induction  . Vitamin D3 (VITAMIN D) 25 MCG tablet Take 1,000 Units by mouth at bedtime.  . [DISCONTINUED] budesonide (ENTOCORT EC) 3 MG 24 hr capsule Take 3 capsules (9 mg total) by mouth daily.   Current Facility-Administered Medications for the 07/14/20 encounter (Office Visit) with Freada Bergeron, MD  Medication  . nitroGLYCERIN (NITROSTAT) SL tablet 0.3 mg     Allergies:   Lipitor [atorvastatin], Remicade [infliximab], Ticagrelor, and Rosuvastatin   Social History   Socioeconomic History  . Marital status: Married    Spouse name: Dane  . Number of children: 2  . Years of education: 12+  . Highest education level: Not on file  Occupational History  . Occupation: ACCOUNT REP.    Employer: [employer]  Tobacco Use  . Smoking status: Former Smoker    Packs/day: 1.00    Years: 35.00    Pack years: 35.00    Types:  Cigarettes    Quit date: 05/31/2004    Years since quitting: 16.1  . Smokeless tobacco: Never Used  Vaping Use  . Vaping Use: Never used  Substance and Sexual Activity  . Alcohol use: No  . Drug use: No  . Sexual activity: Not Currently    Comment: married  Other Topics Concern  . Not on file  Social History Narrative   Lives with her husband and their pets. Her children are adults and live independently.   Social Determinants of Health   Financial Resource Strain: Not on file  Food Insecurity: Not on file  Transportation Needs: Not on file  Physical Activity: Not on file  Stress: Not on file  Social Connections: Not on file     Family History: The patient's family history includes Coronary artery disease in her brother; Heart attack in her mother; Heart disease in her brother, mother, and sister; Hypertension in her brother, mother, and sister; Lung cancer in her maternal grandmother; Stroke in her father. There is no history of Colon cancer, Stomach cancer, Esophageal cancer, or Rectal cancer.  ROS:   Please see the history of present illness.    Review of Systems  Constitutional: Positive for malaise/fatigue. Negative for chills and fever.  HENT: Negative for nosebleeds.   Eyes: Negative for blurred vision and redness.  Respiratory: Positive for sputum production and shortness of breath.   Cardiovascular: Negative for chest pain, palpitations, orthopnea, claudication, leg swelling and PND.  Gastrointestinal: Negative for nausea and vomiting.  Genitourinary: Negative for hematuria.  Musculoskeletal: Positive for joint pain and myalgias. Negative for falls.  Neurological: Positive for headaches. Negative for dizziness and loss of consciousness.  Endo/Heme/Allergies: Negative for polydipsia.  Psychiatric/Behavioral: Negative for substance abuse.    EKGs/Labs/Other Studies Reviewed:    The following studies were reviewed today: Stress test 07/2019  Normal perfusion No  ischemia or scar  The left ventricular ejection fraction is normal (55-65%).  There was no ST segment deviation noted during stress.  This is a low risk study.  Echo 03/2017 Left ventricle: The cavity size was normal. Wall thickness was  normal. Systolic function was normal. The estimated ejection  fraction was in the range  of 55% to 60%. Mild hypokinesis of the  apicalanteroseptal myocardium. Doppler parameters are consistent  with abnormal left ventricular relaxation (grade 1 diastolic  dysfunction).  - Pulmonary arteries: Systolic pressure was mildly increased. PA  peak pressure: 35 mm Hg (S).   CORONARY STENT INTERVENTION   03/2017  INTRAVASCULAR PRESSURE WIRE/FFR STUDY  LEFT HEART CATH AND CORONARY ANGIOGRAPHY    Conclusion    Prox LAD lesion is 65% stenosed.  A drug-eluting stent was successfully placed using a STENT PROMUS PREM MR 3.0X12.  Post intervention, there is a 0% residual stenosis.  The left ventricular systolic function is normal.  LV end diastolic pressure is normal.  The left ventricular ejection fraction is 50-55% by visual estimate.  1. Single vessel obstructive CAD. There is focal and somewhat hazy lesion in the proximal LAD. iFr was 0.88 with FFR of .82. This is of borderline significance but given her clinical scenario with typical chest pain, Ecg changes, and T wave abnormality along with anterior wall motion abnormality we decided to treat this lesion.  2. Overall good LV function 3. Normal LVEDP 4. Successful stenting of the proximal LAD with DES  Plan: DAPT for one year. Anticipate DC tomorrow.    Recent Labs: 06/18/2020: ALT 28; BUN 20; Creatinine, Ser 0.66; Hemoglobin 14.0; Platelets 403.0; Potassium 4.1; Sodium 138  Recent Lipid Panel    Component Value Date/Time   CHOL 237 (H) 04/03/2020 0000   TRIG 219 (H) 04/03/2020 0000   HDL 48 04/03/2020 0000   CHOLHDL 4.9 (H) 04/03/2020 0000   CHOLHDL 7 12/18/2015 1003    VLDL 53.6 (H) 08/15/2014 0734   LDLCALC 149 (H) 04/03/2020 0000   LDLDIRECT 148 (H) 01/25/2020 0724   LDLDIRECT 85.0 12/18/2015 1003     Physical Exam:    VS:  BP 118/68   Pulse 82   Ht 5' 3"  (1.6 m)   Wt 140 lb (63.5 kg)   LMP  (LMP Unknown)   SpO2 98%   BMI 24.80 kg/m     Wt Readings from Last 3 Encounters:  07/14/20 140 lb (63.5 kg)  06/18/20 139 lb (63 kg)  06/11/20 141 lb (64 kg)     GEN:  Well nourished, well developed in no acute distress HEENT: Normal NECK: No JVD; No carotid bruits CARDIAC: RRR, no murmurs, rubs, gallops RESPIRATORY:  Clear to auscultation without rales, wheezing or rhonchi  ABDOMEN: Soft, non-tender, non-distended MUSCULOSKELETAL:  No edema; No deformity  SKIN: Warm and dry NEUROLOGIC:  Alert and oriented x 3 PSYCHIATRIC:  Normal affect   ASSESSMENT:    1. CAD in native artery   2. Hyperlipidemia LDL goal <70   3. Type 2 diabetes mellitus without complication, with long-term current use of insulin ([place])   4. S/P angioplasty with stent   5. Statin intolerance   6. Essential hypertension   7. Arthralgia, unspecified joint    PLAN:    In order of problems listed above:  #HTN: Currently well controlled in clinic but reportedly having episodes of high blood pressure (180s) at home. Has been using a writs cuff. Currently takes all medications at night. She states she feels very tired when blood pressure is high but also struggling with diffuse body aches, joint pain, and generalized malaise.  -Change enalapril 33m to the AM -Continue metop 261mXL daily in the AM -Will have her get an Omron arm blood pressure cuff as I am concerned about the reliability of her wrist cuff -She will  monitor her blood pressures TID and bring log to HTN clinic -Follow-up with HTN clinic next week and me in 1 month  #Generalized Malaise/Fatigue #Joint Pain: Patient concerned about worsening fatigue, total body aches and joint pain. Requesting referral to  rheumatology for further work-up. Has had multiple sleep studies which were negative for significant OSA. -Refer to Rheum for further management  #Known CAD s/p pLAD stent in 2018: Myoview 2021 low risk with no ischemia or infarction. No anginal symptoms. -Continue ASA 50m daily -Continue enalapril 585mdaily -Continue metop 2535mL  #HLD: Intolerant of statins. -Continue repatha (has been off of the med for several weeks) -Continue fenofibrate 145m42mily -Continue vascepa 2g BID -Check fasting lipid panel  #DMII: Having frequent episodes of high blood glucose due to dietary indiscretion. Currently on insulin at night. A1C 8.2 -Refer to endocrinology for further management of DMII -Emphasized importance of dietary compliance       Medication Adjustments/Labs and Tests Ordered: Current medicines are reviewed at length with the patient today.  Concerns regarding medicines are outlined above.  Orders Placed This Encounter  Procedures  . Lipid panel  . Direct LDL  . Ambulatory referral to Endocrinology  . Ambulatory referral to Rheumatology   No orders of the defined types were placed in this encounter.   Patient Instructions  Medication Instructions:  1) START taking your Enalapril in the morning and your Metoprolol in the evening.   *If you need a refill on your cardiac medications before your next appointment, please call your pharmacy*   Lab Work: Lipid panel and LDL Direct today  If you have labs (blood work) drawn today and your tests are completely normal, you will receive your results only by: . MyMarland Kitchenhart Message (if you have MyChart) OR . A paper copy in the mail If you have any lab test that is abnormal or we need to change your treatment, we will call you to review the results.   Testing/Procedures: None   Follow-Up:  Your physician recommends that you schedule a follow-up appointment in: 1 week with our Hypertension Clinic.  At [facility]  and your health needs are our priority.  As part of our continuing mission to provide you with exceptional heart care, we have created designated Provider Care Teams.  These Care Teams include your primary Cardiologist (physician) and Advanced Practice Providers (APPs -  Physician Assistants and Nurse Practitioners) who all work together to provide you with the care you need, when you need it.  We recommend signing up for the patient portal called "MyChart".  Sign up information is provided on this After Visit Summary.  MyChart is used to connect with patients for Virtual Visits (Telemedicine).  Patients are able to view lab/test results, encounter notes, upcoming appointments, etc.  Non-urgent messages can be sent to your provider as well.   To learn more about what you can do with MyChart, go to [email or web address] Your next appointment:   1 month(s)  The format for your next appointment:   In Person  Provider:   You may see HeatFreada Bergeron or one of the following Advanced Practice Providers on your designated Care Team:    ScotRichardson Dopp-C  Vin Robbie Lis-CVermontOther Instructions   You have been referred to Endocrinology and Rheumatology.  Those offices will be in contact to get you scheduled.   Please obtain an Omron brand blood pressure cuff and monitor your blood pressure.  Bring those readings with you to you hypertension clinic appointment.       Signed, Freada Bergeron, MD  07/14/2020 9:59 AM    [facility]

## 2020-07-14 ENCOUNTER — Encounter: Payer: Self-pay | Admitting: Cardiology

## 2020-07-14 ENCOUNTER — Other Ambulatory Visit: Payer: Self-pay | Admitting: Pharmacist

## 2020-07-14 ENCOUNTER — Ambulatory Visit: Payer: BC Managed Care – PPO | Admitting: Cardiology

## 2020-07-14 ENCOUNTER — Other Ambulatory Visit: Payer: Self-pay

## 2020-07-14 VITALS — BP 118/68 | HR 82 | Ht 63.0 in | Wt 140.0 lb

## 2020-07-14 DIAGNOSIS — Z95820 Peripheral vascular angioplasty status with implants and grafts: Secondary | ICD-10-CM

## 2020-07-14 DIAGNOSIS — Z789 Other specified health status: Secondary | ICD-10-CM

## 2020-07-14 DIAGNOSIS — E119 Type 2 diabetes mellitus without complications: Secondary | ICD-10-CM

## 2020-07-14 DIAGNOSIS — I10 Essential (primary) hypertension: Secondary | ICD-10-CM

## 2020-07-14 DIAGNOSIS — E785 Hyperlipidemia, unspecified: Secondary | ICD-10-CM

## 2020-07-14 DIAGNOSIS — I2510 Atherosclerotic heart disease of native coronary artery without angina pectoris: Secondary | ICD-10-CM | POA: Diagnosis not present

## 2020-07-14 DIAGNOSIS — M2550 Pain in unspecified joint: Secondary | ICD-10-CM

## 2020-07-14 DIAGNOSIS — Z794 Long term (current) use of insulin: Secondary | ICD-10-CM

## 2020-07-14 LAB — LIPID PANEL
Chol/HDL Ratio: 2.8 ratio (ref 0.0–4.4)
Cholesterol, Total: 105 mg/dL (ref 100–199)
HDL: 37 mg/dL — ABNORMAL LOW (ref >39)
LDL Chol Calc (NIH): 7 mg/dL (ref 0–99)
Triglycerides: 469 mg/dL — ABNORMAL HIGH (ref 0–149)
VLDL Cholesterol Cal: 61 mg/dL — ABNORMAL HIGH (ref 5–40)

## 2020-07-14 LAB — LDL CHOLESTEROL, DIRECT: LDL Direct: 23 mg/dL (ref 0–99)

## 2020-07-14 MED ORDER — REPATHA SURECLICK 140 MG/ML ~~LOC~~ SOAJ: 1.0000 "pen " | SUBCUTANEOUS | 3 refills | Status: DC

## 2020-07-14 NOTE — Patient Instructions (Signed)
Medication Instructions:  1) START taking your Enalapril in the morning and your Metoprolol in the evening.   *If you need a refill on your cardiac medications before your next appointment, please call your pharmacy*   Lab Work: Lipid panel and LDL Direct today  If you have labs (blood work) drawn today and your tests are completely normal, you will receive your results only by: Marland Kitchen MyChart Message (if you have MyChart) OR . A paper copy in the mail If you have any lab test that is abnormal or we need to change your treatment, we will call you to review the results.   Testing/Procedures: None   Follow-Up:  Your physician recommends that you schedule a follow-up appointment in: 1 week with our Hypertension Clinic.  At [facility], you and your health needs are our priority.  As part of our continuing mission to provide you with exceptional heart care, we have created designated Provider Care Teams.  These Care Teams include your primary Cardiologist (physician) and Advanced Practice Providers (APPs -  Physician Assistants and Nurse Practitioners) who all work together to provide you with the care you need, when you need it.  We recommend signing up for the patient portal called "MyChart".  Sign up information is provided on this After Visit Summary.  MyChart is used to connect with patients for Virtual Visits (Telemedicine).  Patients are able to view lab/test results, encounter notes, upcoming appointments, etc.  Non-urgent messages can be sent to your provider as well.   To learn more about what you can do with MyChart, go to [email or web address].    Your next appointment:   1 month(s)  The format for your next appointment:   In Person  Provider:   You may see Freada Bergeron, MD or one of the following Advanced Practice Providers on your designated Care Team:    Richardson Dopp, PA-C  Robbie Lis, [place]    Other Instructions   You have been referred to Endocrinology  and Rheumatology.  Those offices will be in contact to get you scheduled.   Please obtain an Omron brand blood pressure cuff and monitor your blood pressure.  Bring those readings with you to you hypertension clinic appointment.

## 2020-07-14 NOTE — Telephone Encounter (Signed)
Pt requested 90 DS of Repatha

## 2020-07-15 ENCOUNTER — Telehealth: Payer: Self-pay | Admitting: Pharmacist

## 2020-07-15 NOTE — Telephone Encounter (Addendum)
Called patient to discuss her labs. LDL is wonderful at 69. Continue Repatha. TG are very elevated. Patient is taking fenofibrate, but she is only taking vascepa 2 caps a day because she forgets to take the AM dose. She uses a medbox but only has one box per day. She will get a box for her AM doses and a separate box for PM doses so she remembers to take. We discussed the effect of her uncontrolled blood sugar on her TG. She has been referred to endocrinology. We talked about limiting sweets, breads, and pastas and sweetened beverages.  She is on Cholystyramine which is most likely adding to her elevated TG.  She is on this for possible bile salt induced diarrhea.  Once pt BG is under better control, if TG still high- can discuss with Dr. Joylene Igo stopping bile acid sequestrant.

## 2020-07-16 ENCOUNTER — Other Ambulatory Visit: Payer: Self-pay | Admitting: Obstetrics and Gynecology

## 2020-07-16 ENCOUNTER — Other Ambulatory Visit: Payer: Self-pay

## 2020-07-16 ENCOUNTER — Other Ambulatory Visit: Payer: Self-pay | Admitting: Gastroenterology

## 2020-07-16 ENCOUNTER — Ambulatory Visit
Admission: RE | Admit: 2020-07-16 | Discharge: 2020-07-16 | Disposition: A | Discharge status: Home / Self Care | Payer: BC Managed Care – PPO | Source: Ambulatory Visit | Attending: Obstetrics and Gynecology | Admitting: Obstetrics and Gynecology

## 2020-07-16 DIAGNOSIS — M79641 Pain in right hand: Secondary | ICD-10-CM

## 2020-07-16 DIAGNOSIS — M7989 Other specified soft tissue disorders: Secondary | ICD-10-CM | POA: Diagnosis not present

## 2020-07-16 DIAGNOSIS — S6991XA Unspecified injury of right wrist, hand and finger(s), initial encounter: Secondary | ICD-10-CM | POA: Diagnosis not present

## 2020-07-17 ENCOUNTER — Other Ambulatory Visit: Payer: Self-pay

## 2020-07-17 MED ORDER — VASCEPA 1 G PO CAPS
2.0000 g | ORAL_CAPSULE | Freq: Two times a day (BID) | ORAL | 3 refills | Status: DC
Start: 2020-07-17 — End: 2021-01-07

## 2020-07-17 NOTE — Telephone Encounter (Signed)
Pt's medication was sent to pt's pharmacy as requested. Confirmation received.  °

## 2020-07-18 ENCOUNTER — Telehealth: Payer: Self-pay | Admitting: Cardiology

## 2020-07-18 DIAGNOSIS — E119 Type 2 diabetes mellitus without complications: Secondary | ICD-10-CM | POA: Diagnosis not present

## 2020-07-18 NOTE — Telephone Encounter (Signed)
Freada Bergeron, MD 2 hours ago (11:16 AM)     I am not familiar with many rheumatologists in the area so we just need to refer her to someone who is taking new patients.      Called the patient and left a message to call back regarding the above.

## 2020-07-18 NOTE — Telephone Encounter (Signed)
    Pt said she called Dr. Eda Paschal to make an appt. However, she was told Dr. Estanislado Pandy no longer accepting new patient with fibromyalgia. She said Dr. Johney Frame needs to refer her to a different doctor

## 2020-07-18 NOTE — Telephone Encounter (Signed)
I am not familiar with many rheumatologists in the area so we just need to refer her to someone who is taking new patients.

## 2020-07-21 ENCOUNTER — Ambulatory Visit: Payer: BC Managed Care – PPO | Admitting: Cardiology

## 2020-07-21 NOTE — Telephone Encounter (Signed)
I spoke with the patient today.  Cardiology has made some changes to her HTN meds and she is monitoring her BP TID and has an appointment with the HTN clinic this Wed and will go back to Cardiology 3/14.  Okay to proceed with EGD?  BP readings today 149/85.  See cardiology note from 2/14.  She is having continued epigastric pain.

## 2020-07-21 NOTE — Telephone Encounter (Signed)
Inbound call from patient wanting to know if ok to schedule procedure; states she is only having upper stomach pain with no other symptoms.  Please advise.

## 2020-07-22 NOTE — Telephone Encounter (Signed)
Patient notified.  She will contact us after her appointment with cardiology next month

## 2020-07-22 NOTE — Telephone Encounter (Signed)
She has h/o CAD, has a drug eluting stent. Please wait to reschedule EGD after cardiology evaluation/clearance and inform patient. Thanks

## 2020-07-23 ENCOUNTER — Ambulatory Visit (INDEPENDENT_AMBULATORY_CARE_PROVIDER_SITE_OTHER): Payer: BC Managed Care – PPO | Admitting: Pharmacist

## 2020-07-23 ENCOUNTER — Other Ambulatory Visit: Payer: Self-pay

## 2020-07-23 VITALS — BP 134/72 | HR 82

## 2020-07-23 DIAGNOSIS — I10 Essential (primary) hypertension: Secondary | ICD-10-CM | POA: Diagnosis not present

## 2020-07-23 MED ORDER — EPIPEN 2-PAK 0.3 MG/0.3ML IJ SOAJ: 0.3000 mg | INTRAMUSCULAR | 0 refills | Status: DC | PRN

## 2020-07-23 NOTE — Patient Instructions (Addendum)
Please follow blood pressure technique reviewed   Check blood pressure twice a day  Continue enalapirl 44m daily and metoprolol succinate 269mdaily  Bring your blood pressure cuff, log and medications with you to Dr. PeJacolyn Reedypt  Hypertension "High blood pressure"  Hypertension is often called "The Silent Killer." It rarely causes symptoms until it is extremely  high or has done damage to other organs in the body. For this reason, you should have your  blood pressure checked regularly by your physician. We will check your blood pressure  every time you see a provider at one of our offices.   Your blood pressure reading consists of two numbers. Ideally, blood pressure should be  below 120/80. The first ("top") number is called the systolic pressure. It measures the  pressure in your arteries as your heart beats. The second ("bottom") number is called the diastolic pressure. It measures the pressure in your arteries as the heart relaxes between beats.  The benefits of getting your blood pressure under control are enormous. A 10-point  reduction in systolic blood pressure can reduce your risk of stroke by 27% and heart failure by 28%  Your blood pressure goal is <130/80  To check your pressure at home you will need to:  1. Sit up in a chair, with feet flat on the floor and back supported. Do not cross your ankles or legs. 2. Rest your left arm so that the cuff is about heart level. If the cuff goes on your upper arm,  then just relax the arm on the table, arm of the chair or your lap. If you have a wrist cuff, we  suggest relaxing your wrist against your chest (think of it as Pledging the Flag with the  wrong arm).  3. Place the cuff snugly around your arm, about 1 inch above the crook of your elbow. The  cords should be inside the groove of your elbow.  4. Sit quietly, with the cuff in place, for about 5 minutes. After that 5 minutes press the power  button to start a  reading. 5. Do not talk or move while the reading is taking place.  6. Record your readings on a sheet of paper. Although most cuffs have a memory, it is often  easier to see a pattern developing when the numbers are all in front of you.  7. You can repeat the reading after 1-3 minutes if it is recommended  Make sure your bladder is empty and you have not had caffeine or tobacco within the last 30 min  Always bring your blood pressure log with you to your appointments. If you have not brought your monitor in to be double checked for accuracy, please bring it to your next appointment.  You can find a list of validated (accurate) blood pressure cuffs at [email or web address].[email or web address] Diet  SALT  What is the big deal with sodium? Why the need to limit our intake? What is the connection to  blood pressure? And what is the difference between salt and sodium? Sodium attracts water. Think about it. When you eat an overly salty snack, you tend to become  thirsty and need more water. If you have too much sodium in your bloodstream, your body will  then pull water into the bloodstream as well, trying to correct the imbalance. When you have  more volume in the bloodstream, your blood pressure goes up. Your heart must work harder to  pump the extra volume, and  the increase in pressure can wear out blood vessels faster. You may  also notice bloating and weight gain. Hypertension is one of the leading risk factors for heart  disease. By limiting sodium intake throughout life you are helping decrease your risk of heart  disease later on.   Salt is made up of two minerals. Sodium and chloride. A teaspoon of salt contains about 40%  sodium and 60% chloride. One teaspoon of salt has 2,300 mg of sodium. While the current  USDA guideline states you should consume no more than 2,300 mg per day, both they and the  American Heart Association recommend that you limit this to 1,500 mg to stay healthy. Sea salt  or  [place] pink salt may have a slightly different taste, but they still have almost the same  percent of sodium per teaspoon. So feel free to use them instead of table salt, but don't use  more.  A common myth is that if you don't add salt to your food, you are following a low sodium diet.  However, 75% of the sodium consumed in the American diet is from processed foods, NOT the  salt shaker. We all know that chips and crackers are high in sodium, but there are many other  foods that we may not think of when limiting our sodium. Below are the "salty six" foods that  the American Heart Association wants you to be aware of. 1. Cold cuts - even the healthy sliced [place] can have over 1,000 mg of sodium per slice.   Compare different brands to see which has less sodium if you eat these regularly 2. Pizza - depending on your toppings, a slice of pizza can have up to 760 mg of   sodium. Put more veggies on it or just have a slice with a side salad and still enjoy. 3. Soup - yes even that old home remedy of chicken soup is loaded with sodium. Look   for low sodium versions. Or add a bunch of frozen veggies when heating it, this will   give you less sodium per serving 4. Breads - they may not taste salty, but a single slice of bread can have up to 230 mg of   sodium. Toast for breakfast, a sandwich at lunch and a dinner roll can quickly add up   to over 900 mg in just one day.  5. Chicken - some fresh or frozen chicken is injected with a sodium solution before it   reaches the store. A 4 oz serving should have no more than 100 mg sodium. And   watch for breaded frozen chicken nuggets, strips and tenders. They may seem like a   quick and easy "healthy" meal, but they have high amounts of sodium as well. 6. Burritos/tacos - just 2 teaspoons of taco seasoning can have over 400 mg sodium.   Try making your own with equal parts of cumin, oregano, chili powder and garlic powder.   SUGAR  Sugar is a  huge problem in the modern day diet. Sugar is a HUGE contributor to heart disease, diabetes, high triglyceride levels, fatty liver diease and obesity. Sugar is hidden in almost all packaged foods/beverages. It adds no nutritional benefit to your body and can cause major harm. Added sugar is extra sugar that is added beyond what is naturally found. The American Heart Association recommends limiting added sugars to no more than 25g for women and 36 grams for men per day.  There  are many names for sugar maltose, sucrose (names ending in "ose"), high fructose corn syrup, molasses, cane sugar, corn sweetener, raw sugar, syrup, honey or fruit juice concentrate.   One of the best ways to limit your added sugars is to stop drinking sweetened beverages such as soda, sweet tea, fruit juice or fancy coffee's. There is 65g of added sugars in one 20oz bottle of Coke!! That is equal to 6 donuts.   Pay attention and read all nutrition facts labels. Below is an examples of a nutrition facts label. The #1 is showing you the total sugars where the # 2 is showing you the added sugars. This one severing has almost the max amount of added sugars per day!  Watch out for items that say "low fat" or "no added sugar" as these products are typically very high in sugar. The food industry uses these terms to fool you into thinking they are healthy.  For more information on the dangers of sugar watch WHY Sugar is as Bad as Alcohol (Fructose, The Liver Toxin) on YouTube.    EXERCISE  Exercise can help lower your blood pressure ~5 points systolic (top #) and 8 points diastolic (bottom #)  Exercise is good. We've all heard that. In an ideal world, we would all have time and resources to  get plenty of it. When you are active your heart pumps more efficiently and you will feel better.  Multiple studies show that even walking regularly has benefits that include living a longer life.  The American Heart Association recommends  90-150 minutes per week of exercise (30 minutes  per day most days of the week). You can do this in any increment you wish. Nine or more  10-minute walks count. So does an hour-long exercise class. Break the time apart into what will  work in your life. Some of the best things you can do include walking briskly, jogging, cycling or  swimming laps. Not everyone is ready to "exercise." Sometimes we need to start with just getting active. Here  are some easy ways to be more active throughout the day: Marland Kitchen Take the stairs instead of the elevator . Go for a 10-15 minute walk during your lunch break (find a friend to make it more enjoyable) . When shopping, park at the back of the parking lot . If you take public transportation, get off one stop early and walk the extra distance . Pace around while making phone calls (most of [place] are not attached to phone cords any longer!) Check with your doctor if you aren't sure what your limitations may be. Always remember to drink plenty of water when doing any type of exercise. Don't feel like a failure if you're not getting the 90-150 minutes per week. If you started by being  a couch potato, then just a 10-minute walk each day is a huge improvement. Start with little  victories and work your way up.   Healthy Eating Tips  When looking to improve your eating habits, whether to lose weight, lower blood pressure or just be healthier, it helps to know what a serving size is.   Grains 1 slice of bread,  bagel,  cup pasta or rice  Vegetables 1 cup fresh or raw vegetables,  cup cooked or canned Fruits 1 piece of medium sized fruit,  cup canned,   Meats/Proteins  cup dried       1 oz meat, 1 egg,  cup cooked beans, nuts or seeds  Dairy        Fats Individual yogurt container, 1 cup (8oz)    1 teaspoon margarine/butter or vegetable  milk or milk alternative, 1 slice of cheese          oil; 1 tablespoon mayonnaise or salad dressing                  Plan  ahead: make a menu of the meals for a week then create a grocery list to go with  that menu. Consider meals that easily stretch into a night of leftovers, such as stews or  casseroles. Or consider making two of your favorite meal and put one in the freezer or fridge for  another night.  When you get home from the grocery store wash and prepare your vegetables and fruits.  Then when you need them they are ready to go.  Tips for going to the grocery store: . Buy store or generic brands . Check the weekly ad from your store on-line or in their in-store flyer . Look at the unit price on the shelf tag to compare/contrast the costs of different items . Buy fruits/vegetables in season . Carrots, bananas and apples are low-cost, naturally healthy items . If meats or frozen vegetables are on sale, buy some extras and put in your freezer . Limit buying prepared or "ready to eat" items, even if they are pre-made salads or fruit snacks . Do not shop when you're hungry . Foods at eye level tend to be more expensive. Look on the high and low shelves for deals. . Consider shopping at the farmer's market for fresh foods in season. . Choose canned tuna or salmon instead of fresh . Avoid the cookie and chip aisles (these are expensive, high in calories and low in  nutritional value) Healthy food preparations: . If you can't get lean hamburger, be sure to drain the fat when cooking . Steam, saut (in olive oil), grill or bake foods . Experiment with different seasonings to avoid adding salt to your foods. Kosher salt, sea salt and [place] salt are all still salt and should be avoided          Aim to have one 12 hour fast each day. This means no eating after dinner until breakfast. For example, if you eat dinner around 6 PM then you would not eat anything until 6 AM the next day. This is a great way to help lower your insulin levels, lose weight and reduce your blood pressure.  Resources: American Heart  Association - InstantFinish.fi Go to the Healthy Living tab to get more information American Diabetes Association - www.diabetes.org You don't have to be diabetic - check out the Food and Fitness tab  DASH diet - https://wilson-eaton.com/ Health topics - or just search on their home page for DASH Quit for Life - www.cancer.org Follow the Stay Healthy tab to learn more about smoking cessatio

## 2020-07-23 NOTE — Progress Notes (Addendum)
Patient ID: ANALLELY [name]                 DOB: August 22, 1960                      MRN: [id number]     HPI: [name] is a 60 y.o. female referred by Dr. Johney [name] to HTN clinic. PMH is significant for CAD s/p DES to pLAD in 03/2017, HLD, palpitations, HTN, DM and Crohn's dz.  Seen in clinic on 2/14 by Dr. Johney [name]. Her BP was well controlled at [place]. However, patient reported elevated blood pressures (180/110) at home using a wrist cuff. She was asked to change her enalapril to the AM. She was also asked to get a OMRON upper arm BP cuff and start monitoring blood pressure three times a day.  Patient presents today to HTN clinic. She forgot to bring her blood pressure cuff, log or mediations. She did buy an OMRON upper arm cuff. Has been checking 3-4 times a day, except on Saturday. However, she has been using her right arm and checking while sitting on the edge of her bed with her arm at her side. Not resting prior to checking. Reports all readings have been >341 systolic and >96 diastolic. Sounds like diastolic has only been as high as 90.  She gets dizzy occasionally when standing up. She gets frequent headaches. Thinks it might be from her sinus issues. Denies blurred vision, SOB or swelling.  She hasn't been getting a lot of sleep due to pains in shoulder Had chest pain the other night, also pains in her shoulders (which is normal normal) and a little bit of chest pressure. Took Tylenol and pain resoled. She reports frequent falls recently/ Mainly while walking up her steps. States her legs get weak due to her fibromyalgia. She is supposed to see a rheumatologist but states they wont see her for her fibromyalgia.  States her epipen is expired. She tends to forget her AM meds on the weekends. Although she states if she forgets she always tries to take the enapril. She is taking her enalapil in the AM. She is using a med box for her AM meds- has it at work and takes her morning meds at work. Walks  around the car lot at work. But her legs get weak.   Current HTN meds: enalapirl 57m daily, metoprolol succinate 232mdaily Previously tried:  BP goal: <130/80  Family History: The patient's family history includes Coronary artery disease in her brother; Heart attack in her mother; Heart disease in her brother, mother, and sister; Hypertension in her brother, mother, and sister; Lung cancer in her maternal grandmother; Stroke in her father. There is no history of Colon cancer, Stomach cancer, Esophageal cancer, or Rectal cancer.  Social History: neg ETOH, former smoker quite in [id number] illicit drugs  Diet: decaf coffee every once and awhile, atkins protein shakes, mostly water, occassional soda Cooks with salt No deli meats, sausage, ham, hot sauce, soy sauce  Frozen veggies  Exercise: walks at work, but her legs get weak  Home BP readings: per pt report >130/>80  Wt Readings from Last 3 Encounters:  07/14/20 140 lb (63.5 kg)  06/18/20 139 lb (63 kg)  06/11/20 141 lb (64 kg)   BP Readings from Last 3 Encounters:  07/14/20 118/68  06/18/20 102/62  06/11/20 128/74   Pulse Readings from Last 3 Encounters:  07/14/20 82  06/18/20 85  06/11/20 77  Renal function: CrCl cannot be calculated (Patient's most recent lab result is older than the maximum 21 days allowed.).  Past Medical History:  Diagnosis Date  . Allergic rhinitis due to pollen   . Allergy   . Anxiety state, unspecified   . Arthritis   . Bronchitis    hx  . CAD (coronary artery disease)    s/p NSTEMI in 11/18 treated with a DES to the proximal LAD  . Calculus of gallbladder without mention of cholecystitis or obstruction   . Crohn disease ([place])   . Depressive disorder, not elsewhere classified   . Diabetes mellitus without complication (HCC)    diet controlled  . Esophageal reflux   . Fibromyalgia   . Grade I diastolic dysfunction    Noted on ECHO  . Headache   . History of nuclear stress test     ETT-Myoview 10/17: EF 55%, normal perfusion; Low Risk // Nuclear stress test 05/2018:  EF 63, normal perfusion; Low Risk  . Myalgia and myositis, unspecified   . NSTEMI (non-ST elevated myocardial infarction) ([place]) 04/15/2017  . Other and unspecified hyperlipidemia   . Palpitations   . Pneumonia 13   hx  . S/P angioplasty with stent 04/18/17 with DES to pLAD 04/19/2017  . Seizures ([place])    1 seizure as a teenager   . Unspecified essential hypertension   . Vitamin B deficiency     Current Outpatient Medications on File Prior to Visit  Medication Sig Dispense Refill  . albuterol (PROVENTIL) (2.5 MG/3ML) 0.083% nebulizer solution Take 2.5 mg by nebulization every 4 (four) hours as needed for wheezing or shortness of breath.     Marland Kitchen aspirin 81 MG chewable tablet Chew 81 mg by mouth daily.    . busPIRone (BUSPAR) 10 MG tablet Take 10 mg by mouth 2 (two) times daily.    . cholestyramine light (PREVALITE) 4 g packet Take 1 packet (4 g total) by mouth daily. 90 packet 3  . colestipol (COLESTID) 1 g tablet TAKE 1 TABLET (1 G TOTAL) BY MOUTH TWO (TWO) TIMES DAILY. AS NEEDED 60 tablet 1  . doxycycline (VIBRA-TABS) 100 MG tablet Take 100 mg by mouth 2 (two) times daily as needed. Flare ups    . DULoxetine (CYMBALTA) 60 MG capsule Take 60 mg by mouth daily.    . enalapril (VASOTEC) 5 MG tablet Take 1 tablet (5 mg total) by mouth daily. 30 tablet 11  . EPIPEN 2-PAK 0.3 MG/0.3ML SOAJ injection Inject 0.3 mg into the muscle as needed for anaphylaxis. Reported on 06/27/2015  0  . Evolocumab (REPATHA SURECLICK) 008 MG/ML SOAJ Inject 1 pen into the skin every 14 (fourteen) days. 6 mL 3  . fenofibrate (TRICOR) 145 MG tablet TAKE ONE TABLET BY MOUTH ONCE DAILY 90 tablet 3  . furosemide (LASIX) 40 MG tablet Take 1 tablet (40 mg total) by mouth daily as needed for fluid or edema. 30 tablet 2  . glimepiride (AMARYL) 2 MG tablet Take 2 mg by mouth 2 (two) times a day.    . insulin glargine (LANTUS) 100 UNIT/ML  injection Inject 0.1 mLs (10 Units total) into the skin at bedtime. 10 mL 11  . levocetirizine (XYZAL) 5 MG tablet Take 5 mg by mouth every evening.    . loratadine (CLARITIN) 10 MG tablet Take 10 mg by mouth daily.    . metFORMIN (GLUCOPHAGE) 500 MG tablet Take 2 tablets (1,000 mg total) by mouth 2 (two) times daily with a meal.  120 tablet 0  . metoprolol succinate (TOPROL XL) 25 MG 24 hr tablet Take 1 tablet (25 mg total) by mouth daily. Please make yearly appt with Dr. Meda Coffee for January 2022 for future refills. Thank you 1st attempt 90 tablet 0  . montelukast (SINGULAIR) 10 MG tablet Take 10 mg by mouth at bedtime.    . Multiple Vitamins-Minerals (MULTIVITAMIN WITH MINERALS) tablet Take 1 tablet by mouth daily.    . nitroGLYCERIN (NITROSTAT) 0.4 MG SL tablet Place 0.4 mg under the tongue every 5 (five) minutes as needed for chest pain.    . pantoprazole (PROTONIX) 40 MG tablet Take 1 tablet (40 mg total) by mouth daily. 90 tablet 1  . pioglitazone (ACTOS) 30 MG tablet Take 30 mg by mouth daily.    . Probiotic Product (PROBIOTIC DAILY PO) Take 1 capsule by mouth daily.    Marland Kitchen VASCEPA 1 g capsule Take 2 capsules (2 g total) by mouth 2 (two) times daily. 360 capsule 3  . vedolizumab (ENTYVIO) 300 MG injection Inject 300 mg into the vein every 8 (eight) weeks. After the standard induction 1 vial 6  . Vitamin D3 (VITAMIN D) 25 MCG tablet Take 1,000 Units by mouth at bedtime.     Current Facility-Administered Medications on File Prior to Visit  Medication Dose Route Frequency Provider Last Rate Last Admin  . nitroGLYCERIN (NITROSTAT) SL tablet 0.3 mg  0.3 mg Sublingual Q5 min PRN [name], MD        Allergies  Allergen Reactions  . Lipitor [Atorvastatin] Other (See Comments)    Elevated ALT  . Remicade [Infliximab]     REACTION: SOB  . Ticagrelor     Shortness of breath  . Rosuvastatin Other (See Comments)    Pt reports causes muscle aches and leg pains and feet to swell    There  were no vitals taken for this visit.   Assessment/Plan:  1. Hypertension - Blood pressure close to goal of <130/80 in clinic today. Home readings are unreliable due to poor technique. I have educated patient on proper technique and provided her a hand out with instructions. Since her BP was at goal last visit with Dr. Johney [name] and I really don't have reliable home readings to go by, no medication changes were made. Continue enalapirl 72m daily and metoprolol succinate 241mdaily. I have asked her to check her blood pressure twice a day and to bring her monitor, log and medications to Dr. PeJacolyn Reedypt. If blood pressure is indeed elevated and changes are made, we would be happy to follow back up with the patient.   2. Medication review- New Rx for Epipen sent to pharmacy since her's was expired. Explained that she does not want to run out of this medication incase she is in an emergency and needs it.  Addendum: EpiPen needed PA- called pt to see why she was on Epipen. States she is not sure why. It was a long time ago. Does not have allergies to foods. Had reaction to Remicaid. Does not wish to get medication as she doesn't think she needs it.  3. Myalgias- Exercise limited by leg weakness/muscle pains. Encouraged her to go to rheumatologist for further workup for cause of her weakness. I also suggested if she was unhappy with the response from her PCP and rheumatologist, she could consider seeing a functional medicine Dr. However, I feel these physicians are hit or miss in regards to their evidence based medicine and I did not  know of any in the area to refer her to. Also unsure about insurance coverage.    Thank you  Ramond Dial, Pharm.D, BCPS, CPP [facility]  [address] [place], [place] [address]  Phone: [phone]; Fax: [phone]

## 2020-07-24 ENCOUNTER — Telehealth: Payer: Self-pay | Admitting: Cardiology

## 2020-07-24 NOTE — Telephone Encounter (Signed)
Pt is calling to inform us that she is filling for disability for all her ailments, like fibromyalgia, cardiac issues, and diabetes.  Pt states she will be meeting with an attorney in the next week, and he will be providing forms for each of her Care Team to fill out accordingly.  Pt is wandering who she should provide these forms to when she obtains them.  Advised the pt that her disability forms will need to be provided to our Medical records dept for exchange, and so releases can be signed.  Also informed her there will be a fee for her to pay for the Provider to fill out. Pt verbalized understanding and agrees with this plan. Will send this back to medical records dept as an FYI.

## 2020-07-24 NOTE — Telephone Encounter (Signed)
Medical records does not give any information over the phone. She can contact a nurse at the practice. The nurses are allowed to deliver medical information over the phone. Medical records staff does not have the knowledge to answer questions about their health.

## 2020-07-24 NOTE — Telephone Encounter (Signed)
    Pt would like to get information when did she had her first heart attack, she just need the date information and when was the first time she was seen in our office

## 2020-07-25 ENCOUNTER — Telehealth: Payer: Self-pay

## 2020-07-25 NOTE — Telephone Encounter (Signed)
The patient has been notified of the result and verbalized understanding.  All questions (if any) were answered.  Patient states she has been taking her Fenofibrate and Repatha. She often forgets to take her Vascepa but states she will be better about remembering from now on.   Wilma Flavin, RN 07/25/2020 9:04 AM

## 2020-07-25 NOTE — Telephone Encounter (Signed)
Spoke with the patient and informed her that Dr. Johney Frame does not have any recommendations for a rheumatologist in the area. Encouraged the patient to call us back once she finds a provider who is taking new patients and we would be happy to refer her to said provider. Pt agreeable to plan.

## 2020-07-25 NOTE — Telephone Encounter (Signed)
-----   Message from Freada Bergeron, MD sent at 07/15/2020  8:11 AM EST ----- Her triglycerides are very high. This may make her LDL look falsely lower than the number reported. Is she taking her vascepa and fenofibrate as these will really help her triglyceride levels? We can also get her back on the repatha to help with her numbers as well.

## 2020-07-28 DIAGNOSIS — M25511 Pain in right shoulder: Secondary | ICD-10-CM | POA: Diagnosis not present

## 2020-07-28 DIAGNOSIS — M25512 Pain in left shoulder: Secondary | ICD-10-CM | POA: Diagnosis not present

## 2020-07-29 ENCOUNTER — Other Ambulatory Visit: Payer: Self-pay

## 2020-07-29 ENCOUNTER — Telehealth: Payer: Self-pay | Admitting: Cardiology

## 2020-07-29 DIAGNOSIS — I10 Essential (primary) hypertension: Secondary | ICD-10-CM

## 2020-07-29 DIAGNOSIS — E782 Mixed hyperlipidemia: Secondary | ICD-10-CM

## 2020-07-29 DIAGNOSIS — R0600 Dyspnea, unspecified: Secondary | ICD-10-CM

## 2020-07-29 DIAGNOSIS — R0609 Other forms of dyspnea: Secondary | ICD-10-CM

## 2020-07-29 DIAGNOSIS — I25119 Atherosclerotic heart disease of native coronary artery with unspecified angina pectoris: Secondary | ICD-10-CM

## 2020-07-29 MED ORDER — PANTOPRAZOLE SODIUM 40 MG PO TBEC
40.0000 mg | DELAYED_RELEASE_TABLET | Freq: Every day | ORAL | 3 refills | Status: DC
Start: 2020-07-29 — End: 2020-12-15

## 2020-07-29 MED ORDER — PANTOPRAZOLE SODIUM 40 MG PO TBEC
40.0000 mg | DELAYED_RELEASE_TABLET | Freq: Every day | ORAL | 3 refills | Status: DC
Start: 2020-07-29 — End: 2020-07-29

## 2020-07-29 MED ORDER — METOPROLOL SUCCINATE ER 25 MG PO TB24: 25.0000 mg | ORAL_TABLET | Freq: Every day | ORAL | 3 refills | Status: DC

## 2020-07-29 NOTE — Telephone Encounter (Signed)
Pt calling requesting that her medications be resent to a different pharmacy. Resent pt's medication to pharmacy as requested. Confirmation received.

## 2020-07-29 NOTE — Telephone Encounter (Signed)
Pt's medications was sent to pt's pharmacy as requested. Confirmation received.

## 2020-07-29 NOTE — Telephone Encounter (Signed)
   *  STAT* If patient is at the pharmacy, call can be transferred to refill team.   1. Which medications need to be refilled? (please list name of each medication and dose if known)   metoprolol succinate (TOPROL XL) 25 MG 24 hr tablet  pantoprazole (PROTONIX) 40 MG tablet   2. Which pharmacy/location (including street and city if local pharmacy) is medication to be sent to? [facility], [address] [place], [place], [place] [address], phone# [phone]  3. Do they need a 30 day or 90 day supply? 90 days

## 2020-07-30 DIAGNOSIS — K50813 Crohn's disease of both small and large intestine with fistula: Secondary | ICD-10-CM | POA: Diagnosis not present

## 2020-07-31 ENCOUNTER — Telehealth: Payer: Self-pay | Admitting: Gastroenterology

## 2020-07-31 NOTE — Telephone Encounter (Signed)
Inbound call from patient requesting a call back from a nurse.  States she is filing for medical disability.

## 2020-08-01 NOTE — Telephone Encounter (Signed)
Left message for patient to call back  

## 2020-08-06 NOTE — Telephone Encounter (Signed)
I left a message for the patient to call back if she still needs assistance.

## 2020-08-08 NOTE — Progress Notes (Signed)
Cardiology Office Note:    Date:  08/11/2020   ID:  [name], DOB 06/16/1960, MRN [id number]  PCP:  Patient, No Pcp Per   [facility]  Cardiologist:  Freada Bergeron, MD  Advanced Practice Provider:  No care team member to display Electrophysiologist:  None  :[id number]}   Referring MD: [facility]     History of Present Illness:    [name] is a 60 y.o. female with a hx of CAD s/p DES to pLAD in 03/2017, HLD, palpitations, HTN, DM and Crohn's dz seen for follow up.   Had previously seen Dr. Meda Coffee for chest pain where [facility] 07/2019 was low risk without ischemia or scar.  We last saw her in clinic on 07/14/20 where she was having fluctuating blood pressures as well as intermittent episodes of shortness of breath. Patient was having highs with 180s/110s but no significant lows. Felt fatigued at the time. She followed up with HTN clinic where her home readings were thought to be unreliable due to poor technique. She was continued on enalapril 36may and metop 255mdaily. She now presents for follow-up.  Today, the patient states that she feels overall okay. Has been having symptoms from her fibromyalgia with chronic pain and fatigue. Was unable to get in with rheumatology as not taking new fibromyalgia patients. Also states she been having intermittent episodes of chest tightness that is not exertional in nature. Cannot predict when they are going to occur and each episode lasts a couple of minutes before resolving. Thinks it may be related to reflux and is hoping to undergo EGD with GI.  She has also noted some shortness of breath with exertion that is worse with walking an incline or stairs .Has associated bilateral leg weakness. States this is similar to how she felt prior to having her stent placed in 2018. No palpitations, lightheadedness, syncope. No orthopnea, LE edema, PND or orthopnea. Blood pressure mainly running 130-150s at home. Has been  compliant with all medications since last visit.   Past Medical History:  Diagnosis Date  . Allergic rhinitis due to pollen   . Allergy   . Anxiety state, unspecified   . Arthritis   . Bronchitis    hx  . CAD (coronary artery disease)    s/p NSTEMI in 11/18 treated with a DES to the proximal LAD  . Calculus of gallbladder without mention of cholecystitis or obstruction   . Cro[place] disease ([place]  . Depressive disorder, not elsewhere classified   . Diabetes mellitus without complication (HCC)    diet controlled  . Esophageal reflux   . Fibromyalgia   . Grade I diastolic dysfunction    Noted on ECHO  . Headache   . History of nuclear stress test    ETT-Myoview 10/17: EF 55%, normal perfusion; Low Risk // Nuclear stress test 05/2018:  EF 63, normal perfusion; Low Risk  . Myalgia and myositis, unspecified   . NSTEMI (non-ST elevated myocardial infarction) ([facility]11/16/2018  . Other and unspecified hyperlipidemia   . Palpitations   . Pneumonia 13   hx  . S/P angioplasty with stent 04/18/17 with DES to pLAD 04/19/2017  . Seizures ([facility]   1 seizure as a teenager   . Unspecified essential hypertension   . Vitamin B deficiency     Past Surgical History:  Procedure Laterality Date  . BIOPSY  12/05/2018   Procedure: BIOPSY;  Surgeon: NaMauri PoleMD;  Location: [facility];  Service: Endoscopy;;  . CHOLECYSTECTOMY N/A 09/19/2014   Procedure: LAPAROSCOPIC CHOLECYSTECTOMY WITH INTRAOPERATIVE CHOLANGIOGRAM;  Surgeon: Donnie Mesa, MD;  Location: [facility];  Service: General;  Laterality: N/A;  . COLONOSCOPY WITH PROPOFOL N/A 12/05/2018   Procedure: COLONOSCOPY WITH PROPOFOL;  Surgeon: Mauri Pole, MD;  Location: [facility];  Service: Endoscopy;  Laterality: N/A;  chromoendoscopy  . CORONARY STENT INTERVENTION N/A 04/18/2017   Procedure: CORONARY STENT INTERVENTION;  Surgeon: Martinique, Peter M, MD;  Location: [facility];  Service: Cardiovascular;  Laterality: N/A;  .  ESOPHAGOGASTRODUODENOSCOPY    . EXPLORATORY LAPAROTOMY     for endometriosis  . INTRAVASCULAR PRESSURE WIRE/FFR STUDY N/A 04/18/2017   Procedure: INTRAVASCULAR PRESSURE WIRE/FFR STUDY;  Surgeon: Martinique, Peter M, MD;  Location: [facility];  Service: Cardiovascular;  Laterality: N/A;  . LEFT HEART CATH AND CORONARY ANGIOGRAPHY N/A 04/18/2017   Procedure: LEFT HEART CATH AND CORONARY ANGIOGRAPHY;  Surgeon: Martinique, Peter M, MD;  Location: [facility];  Service: Cardiovascular;  Laterality: N/A;  . POLYPECTOMY  12/05/2018   Procedure: POLYPECTOMY;  Surgeon: Mauri Pole, MD;  Location: [facility];  Service: Endoscopy;;  . SUBMUCOSAL LIFTING INJECTION  12/05/2018   Procedure: SUBMUCOSAL LIFTING INJECTION;  Surgeon: Mauri Pole, MD;  Location: [facility];  Service: Endoscopy;;  . TONSILLECTOMY AND ADENOIDECTOMY    . TUBAL LIGATION     BILATERAL    Current Medications: Current Meds  Medication Sig  . albuterol (PROVENTIL) (2.5 MG/3ML) 0.083% nebulizer solution Take 2.5 mg by nebulization every 4 (four) hours as needed for wheezing or shortness of breath.   Marland Kitchen aspirin EC 81 MG tablet Take 81 mg by mouth daily. Swallow whole.  . busPIRone (BUSPAR) 10 MG tablet Take 10 mg by mouth 2 (two) times daily. Pt taking once a day  . colestipol (COLESTID) 1 g tablet TAKE 1 TABLET (1 G TOTAL) BY MOUTH TWO (TWO) TIMES DAILY. AS NEEDED  . DULoxetine (CYMBALTA) 60 MG capsule Take 60 mg by mouth daily.  . enalapril (VASOTEC) 10 MG tablet Take 1 tablet (10 mg total) by mouth daily.  . Evolocumab (REPATHA SURECLICK) 629 MG/ML SOAJ Inject 1 pen into the skin every 14 (fourteen) days.  . fenofibrate (TRICOR) 145 MG tablet TAKE ONE TABLET BY MOUTH ONCE DAILY  . furosemide (LASIX) 40 MG tablet Take 1 tablet (40 mg total) by mouth daily as needed for fluid or edema.  . insulin glargine (LANTUS) 100 UNIT/ML injection Inject 0.1 mLs (10 Units total) into the skin at bedtime.  Marland Kitchen levocetirizine  (XYZAL) 5 MG tablet Take 5 mg by mouth every evening.  . metFORMIN (GLUCOPHAGE-XR) 500 MG 24 hr tablet Take 2,000 mg by mouth every evening.  . metoprolol succinate (TOPROL XL) 25 MG 24 hr tablet Take 1 tablet (25 mg total) by mouth daily.  . montelukast (SINGULAIR) 10 MG tablet Take 10 mg by mouth at bedtime.  . Multiple Vitamins-Minerals (MULTIVITAMIN WITH MINERALS) tablet Take 1 tablet by mouth daily.  . nitroGLYCERIN (NITROSTAT) 0.4 MG SL tablet Place 0.4 mg under the tongue every 5 (five) minutes as needed for chest pain.  . pantoprazole (PROTONIX) 40 MG tablet Take 1 tablet (40 mg total) by mouth daily.  . pioglitazone (ACTOS) 30 MG tablet Take 30 mg by mouth daily.  . Probiotic Product (PROBIOTIC DAILY PO) Take 1 capsule by mouth daily.  Marland Kitchen VASCEPA 1 g capsule Take 2 capsules (2 g total) by mouth  2 (two) times daily.  . vedolizumab (ENTYVIO) 300 MG injection Inject 300 mg into the vein every 8 (eight) weeks. After the standard induction  . Vitamin D3 (VITAMIN D) 25 MCG tablet Take 1,000 Units by mouth at bedtime.  . [DISCONTINUED] enalapril (VASOTEC) 5 MG tablet Take 1 tablet (5 mg total) by mouth daily.   Current Facility-Administered Medications for the 08/11/20 encounter (Office Visit) with Freada Bergeron, MD  Medication  . nitroGLYCERIN (NITROSTAT) SL tablet 0.3 mg     Allergies:   Lipitor [atorvastatin], Remicade [infliximab], Ticagrelor, and Rosuvastatin   Social History   Socioeconomic History  . Marital status: Married    Spouse name: Dane  . Number of children: 2  . Years of education: 12+  . Highest education level: Not on file  Occupational History  . Occupation: ACCOUNT REP.    Employer: [employer]  Tobacco Use  . Smoking status: Former Smoker    Packs/day: 1.00    Years: 35.00    Pack years: 35.00    Types: Cigarettes    Quit date: 05/31/2004    Years since quitting: 16.2  . Smokeless tobacco: Never Used  Vaping Use  . Vaping Use: Never  used  Substance and Sexual Activity  . Alcohol use: No  . Drug use: No  . Sexual activity: Not Currently    Comment: married  Other Topics Concern  . Not on file  Social History Narrative   Lives with her husband and their pets. Her children are adults and live independently.   Social Determinants of Health   Financial Resource Strain: Not on file  Food Insecurity: Not on file  Transportation Needs: Not on file  Physical Activity: Not on file  Stress: Not on file  Social Connections: Not on file     Family History: The patient's family history includes Coronary artery disease in her brother; Heart attack in her mother; Heart disease in her brother, mother, and sister; Hypertension in her brother, mother, and sister; Lung cancer in her maternal grandmother; Stroke in her father. There is no history of Colon cancer, Stomach cancer, Esophageal cancer, or Rectal cancer.  ROS:   Please see the history of present illness.    Review of Systems  Constitutional: Positive for malaise/fatigue. Negative for chills and fever.  HENT: Negative for hearing loss and sore throat.   Eyes: Negative for blurred vision and redness.  Respiratory: Positive for shortness of breath. Negative for cough.   Cardiovascular: Positive for chest pain. Negative for palpitations, orthopnea, claudication, leg swelling and PND.  Gastrointestinal: Positive for abdominal pain and heartburn. Negative for blood in stool, melena, nausea and vomiting.  Genitourinary: Negative for dysuria and hematuria.  Musculoskeletal: Positive for back pain, joint pain and myalgias.  Neurological: Negative for dizziness and loss of consciousness.  Endo/Heme/Allergies: Negative for polydipsia.  Psychiatric/Behavioral: Negative for substance abuse.    EKGs/Labs/Other Studies Reviewed:    The following studies were reviewed today: Stress test 07/2019  Normal perfusion No ischemia or scar  The left ventricular ejection fraction is  normal (55-65%).  There was no ST segment deviation noted during stress.  This is a low risk study.  Echo 03/2017 Left ventricle: The cavity size was normal. Wall thickness was  normal. Systolic function was normal. The estimated ejection  fraction was in the range of 55% to 60%. Mild hypokinesis of the  apicalanteroseptal myocardium. Doppler parameters are consistent  with abnormal left ventricular relaxation (grade 1 diastolic  dysfunction).  - Pulmonary arteries: Systolic pressure was mildly increased. PA  peak pressure: 35 mm Hg (S).   CORONARY STENT INTERVENTION11/2018  INTRAVASCULAR PRESSURE WIRE/FFR STUDY  LEFT HEART CATH AND CORONARY ANGIOGRAPHY    Conclusion    Prox LAD lesion is 65% stenosed.  A drug-eluting stent was successfully placed using a STENT PROMUS PREM MR 3.0X12.  Post intervention, there is a 0% residual stenosis.  The left ventricular systolic function is normal.  LV end diastolic pressure is normal.  The left ventricular ejection fraction is 50-55% by visual estimate.  1. Single vessel obstructive CAD. There is focal and somewhat hazy lesion in the proximal LAD. iFr was 0.88 with FFR of .82. This is of borderline significance but given her clinical scenario with typical chest pain, Ecg changes, and T wave abnormality along with anterior wall motion abnormality we decided to treat this lesion.  2. Overall good LV function 3. Normal LVEDP 4. Successful stenting of the proximal LAD with DES  Plan: DAPT for one year. Anticipate DC tomorrow.     EKG:  EKG is  ordered today.  The ekg ordered today demonstrates NSR with HR 80  Recent Labs: 06/18/2020: ALT 28; BUN 20; Creatinine, Ser 0.66; Hemoglobin 14.0; Platelets 403.0; Potassium 4.1; Sodium 138  Recent Lipid Panel    Component Value Date/Time   CHOL 105 07/14/2020 1007   TRIG 469 (H) 07/14/2020 1007   HDL 37 (L) 07/14/2020 1007   CHOLHDL 2.8 07/14/2020 1007   CHOLHDL 7  12/18/2015 1003   VLDL 53.6 (H) 08/15/2014 0734   LDLCALC 7 07/14/2020 1007   LDLDIRECT 23 07/14/2020 1007   LDLDIRECT 85.0 12/18/2015 1003     Risk Assessment/Calculations:       Physical Exam:    VS:  LMP  (LMP Unknown)     Wt Readings from Last 3 Encounters:  07/14/20 140 lb (63.5 kg)  06/18/20 139 lb (63 kg)  06/11/20 141 lb (64 kg)     GEN:  Well nourished, well developed in no acute distress HEENT: Normal NECK: No JVD; No carotid bruits CARDIAC: RRR, no murmurs, rubs, gallops RESPIRATORY:  Clear to auscultation without rales, wheezing or rhonchi  ABDOMEN: Soft, non-tender, non-distended MUSCULOSKELETAL:  No edema; No deformity  SKIN: Warm and dry NEUROLOGIC:  Alert and oriented x 3 PSYCHIATRIC:  Normal affect   ASSESSMENT:    1. Coronary artery disease involving native coronary artery of native heart with angina pectoris ([place])   2. Chest pain of uncertain etiology   3. Essential hypertension   4. Mixed hyperlipidemia   5. S/P angioplasty with stent   6. Type 2 diabetes mellitus without complication, with long-term current use of insulin ([place])   7. Dyspnea on exertion   8. Statin myopathy   9. Arthralgia, unspecified joint    PLAN:    In order of problems listed above:  #Known CAD s/p pLAD stent in 2018: #SOB and chest tightness Patient with worsening DOE that have been ongoing for past several weeks. States that symptoms are similar to that which she experienced with her stent in 2018. Has episodes of chest tightness as well that are not related to exertion. Myoview in 2021 negative for ischemia or infarction, but she states that symptoms are worse now. Will repeat myoview to assess for ischemia. -Check Myoview--if low/intermediate risk, can undergo EGD without further work-up -Continue ASA 32m daily -Increase enalapril to 115mdaily -Continue metop 2565mL -Management of HLD as below -Management  of suspected GERD per GI; will obtain myoview prior to  EGD given worsening DOE as detailed above  #HTN: Remains elevated in 130-150s on home cuff. -Increase enalapril to 68m daily -Continue metop 236mXL daily  -Keep blood pressure log -BMET next week  #HLD: #Hypertriglyceridemia Intolerant of statins. LDL controlled at 7. TG 469 (was not taking her fenofibrate or vascepa) -Continue repatha  -Continue fenofibrate 14513maily -Continue vascepa 2g BID -Patient states that she has been compliant with medications since last visit  #Generalized Malaise/Fatigue #Joint Pain: Patient concerned about worsening fatigue, total body aches and joint pain. Attempted to refer to rheumatology but not taking new fibromyalgia patients. Has had multiple sleep studies which were negative for significant OSA. -Management per primary care  #DMII: Having frequent episodes of high blood glucose due to dietary indiscretion. Currently on insulin at night. A1C 8.2 -Referred to endocrinology for further management of DMII -Emphasized importance of dietary compliance    Shared Decision Making/Informed Consent The risks [chest pain, shortness of breath, cardiac arrhythmias, dizziness, blood pressure fluctuations, myocardial infarction, stroke/transient ischemic attack, nausea, vomiting, allergic reaction, radiation exposure, metallic taste sensation and life-threatening complications (estimated to be 1 in 10,000)], benefits (risk stratification, diagnosing coronary artery disease, treatment guidance) and alternatives of a nuclear stress test were discussed in detail with Ms. August and she agrees to proceed.   Medication Adjustments/Labs and Tests Ordered: Current medicines are reviewed at length with the patient today.  Concerns regarding medicines are outlined above.  Orders Placed This Encounter  Procedures  . Basic metabolic panel  . Cardiac Stress Test: Informed Consent Details: Physician/Practitioner Attestation; Transcribe to consent form and obtain  patient signature  . MYOCARDIAL PERFUSION IMAGING  . EKG 12-Lead   Meds ordered this encounter  Medications  . enalapril (VASOTEC) 10 MG tablet    Sig: Take 1 tablet (10 mg total) by mouth daily.    Dispense:  90 tablet    Refill:  3    Patient Instructions  Medication Instructions:  Increase enalapril 31m49m mouth daily *If you need a refill on your cardiac medications before your next appointment, please call your pharmacy*   Lab Work: BMET 1 week  If you have labs (blood work) drawn today and your tests are completely normal, you will receive your results only by: . MyMarland Kitchenhart Message (if you have MyChart) OR . A paper copy in the mail If you have any lab test that is abnormal or we need to change your treatment, we will call you to review the results.   Testing/Procedures:  Your physician has requested that you have a lexiscan myoview. For further information please visit [email or web address] follow instruction sheet, as given.   Follow-Up: [facility] and your health needs are our priority.  As part of our continuing mission to provide you with exceptional heart care, we have created designated Provider Care Teams.  These Care Teams include your primary Cardiologist (physician) and Advanced Practice Providers (APPs -  Physician Assistants and Nurse Practitioners) who all work together to provide you with the care you need, when you need it.  We recommend signing up for the patient portal called "MyChart".  Sign up information is provided on this After Visit Summary.  MyChart is used to connect with patients for Virtual Visits (Telemedicine).  Patients are able to view lab/test results, encounter notes, upcoming appointments, etc.  Non-urgent messages can be sent to your provider as well.   To learn more about  what you can do with MyChart, go to [email or web address].    Your next appointment:   F/U with Dr. Gwyndolyn Kaufman in 3 months        Signed, Freada Bergeron, MD  08/11/2020 11:02 AM    [place]

## 2020-08-09 IMAGING — MR MR PELVIS WO/W CM
8 of 10 series · 26 of 48 positions shown · IV contrast (Multihance 13ml)
Comparison: 03/19/2014 CT abdomen/pelvis.

CLINICAL DATA: Crohn disease.  Sudden onset rectal pain for 2 days.

EXAM:
MRI PELVIS WITHOUT AND WITH CONTRAST
TECHNIQUE: Multiplanar multisequence MR imaging of the pelvis was performed
both before and after administration of intravenous contrast.
CONTRAST:  13mL MULTIHANCE GADOBENATE DIMEGLUMINE 529 MG/ML IV SOLN

[Series 2: T2 · sagittal · 2.5mm · 0.81mm/px · 4 of 36 slices shown (1 of 3)]
[im 1/36]
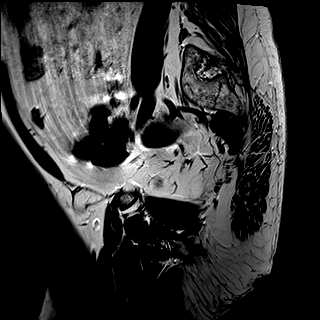
[im 12/36]
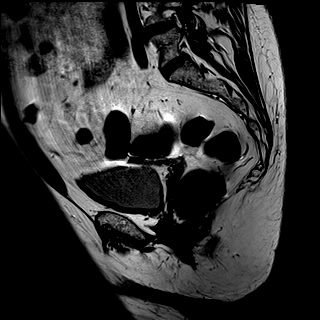
[im 24/36]
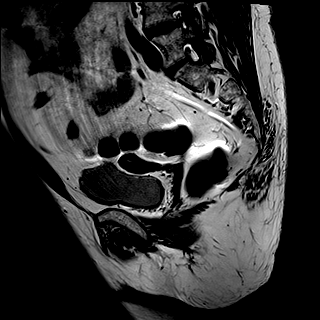
[im 36/36]
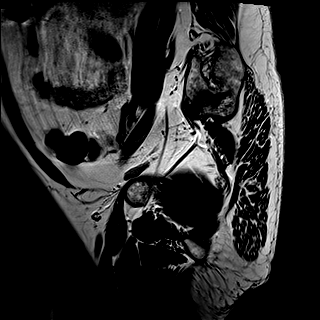

[Series 3: sag 2 fs · sagittal · 2.5mm · 0.41mm/px · 3 of 36 slices shown]
[im 1/36]
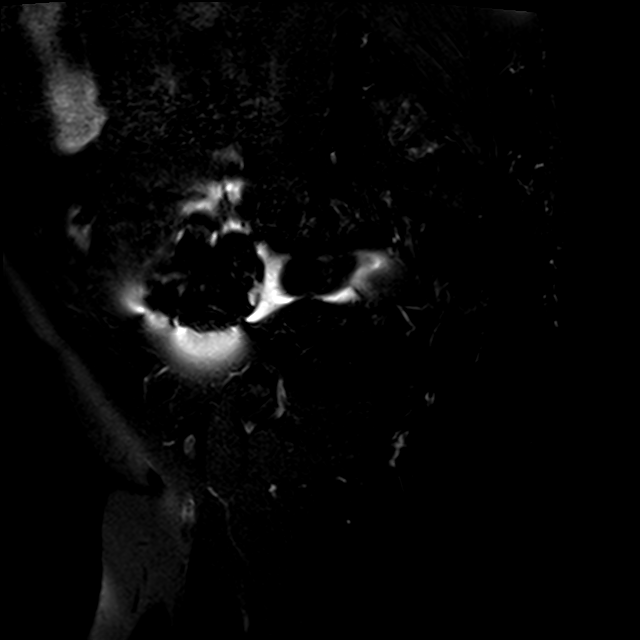
[im 18/36]
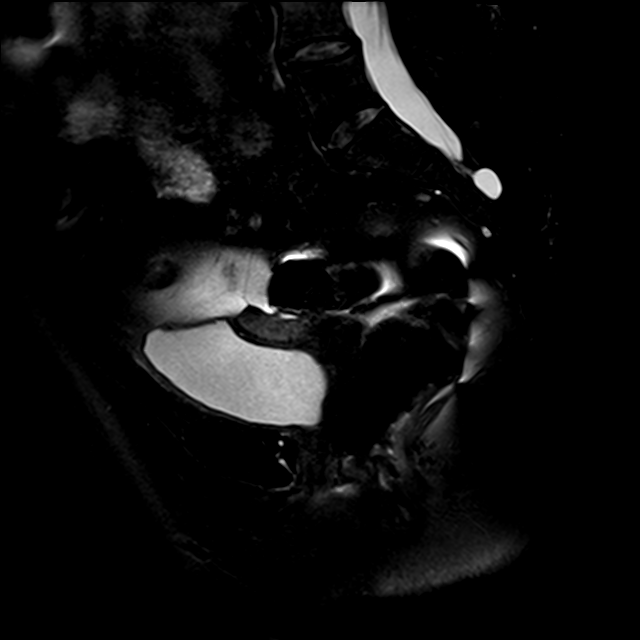
[im 36/36]
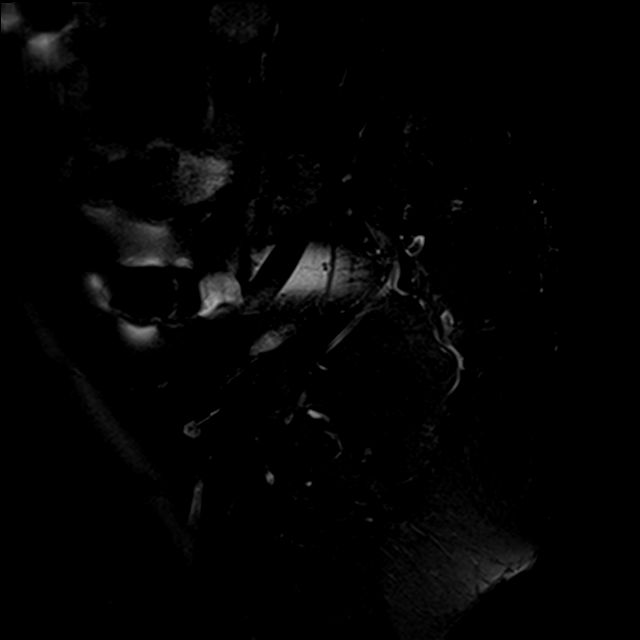

[Series 4: T2 · axial · 5.0mm · 0.41mm/px · z∈[-71,+163]mm · 4 of 40 slices shown (2 of 3)]
[im 1/40]
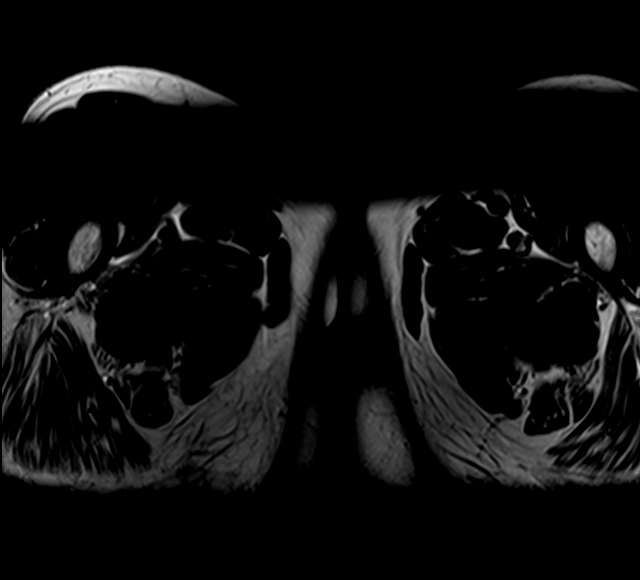
[im 14/40]
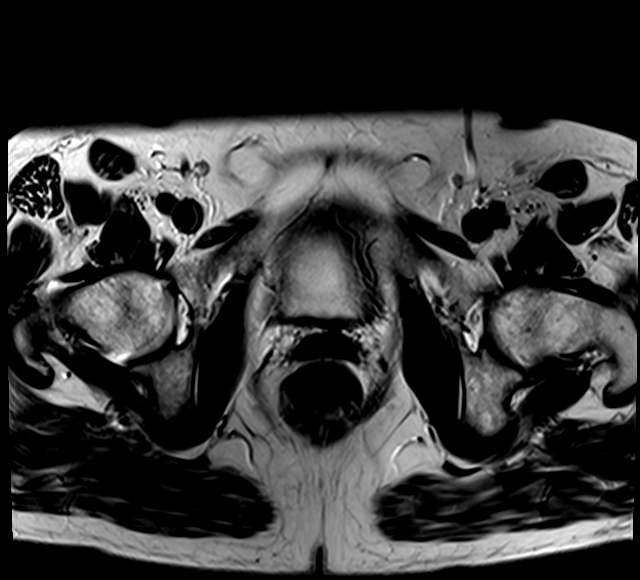
[im 27/40]
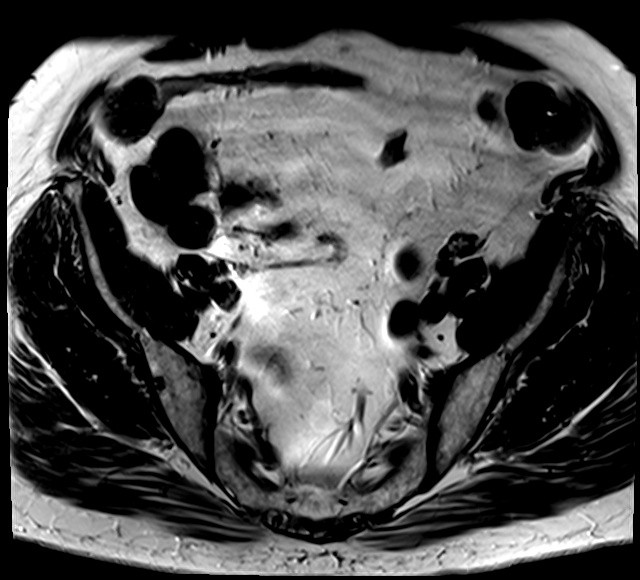
[im 40/40]
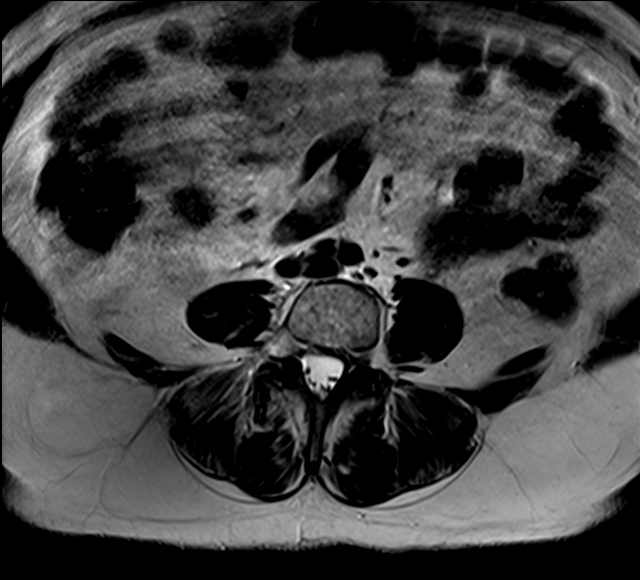

[Series 5: T1 · axial · 4.0mm · 0.57mm/px · z∈[-117,+9]mm · 3 of 30 slices shown]
[im 1/30]
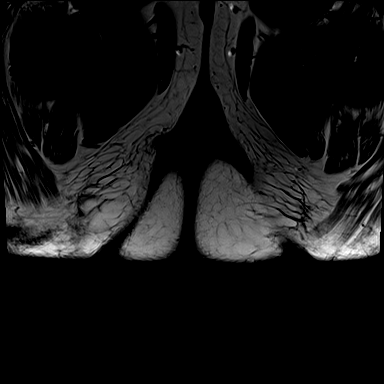
[im 15/30]
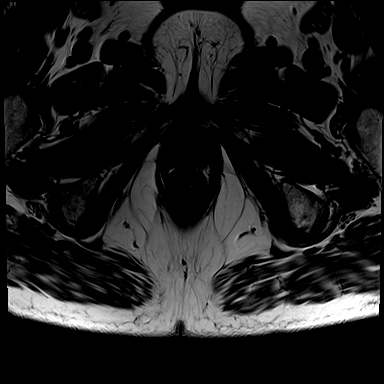
[im 30/30]
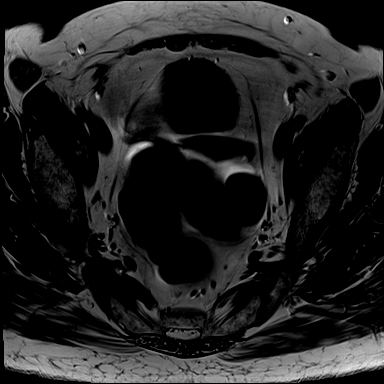

[Series 6: T2 · axial · 4.0mm · 0.57mm/px · z∈[-117,+9]mm · 3 of 30 slices shown (3 of 3)]
[im 1/30]
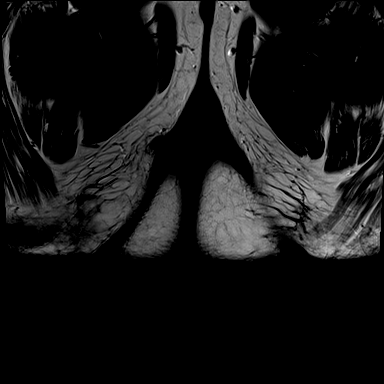
[im 15/30]
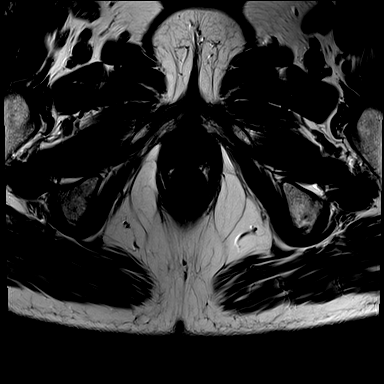
[im 30/30]
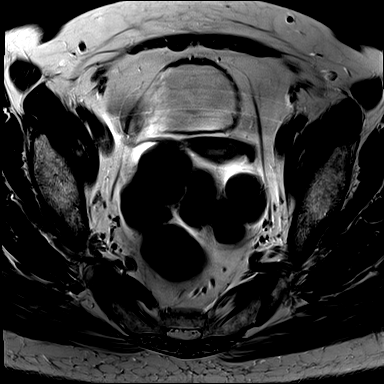

[Series 7: T2 fat-sat · axial · 4.0mm · 0.57mm/px · z∈[-117,+9]mm · 3 of 30 slices shown (1 of 2)]
[im 1/30]
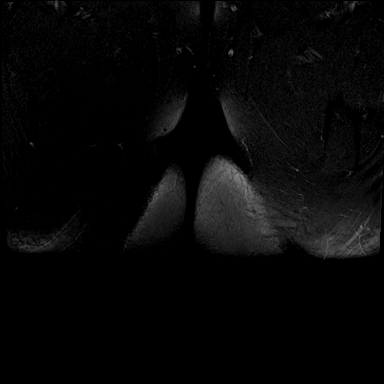
[im 15/30]
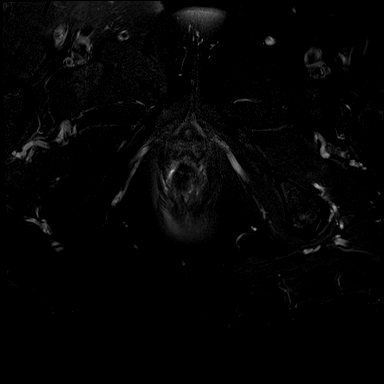
[im 30/30]
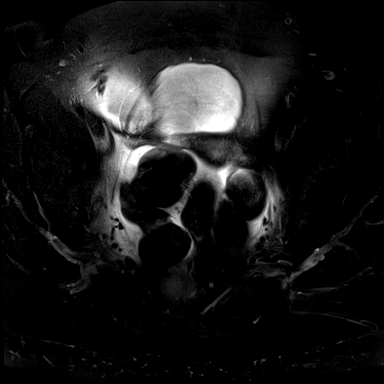

[Series 8: T2 fat-sat · coronal · 4.0mm · 0.75mm/px · 3 of 31 slices shown (2 of 2)]
[im 1/31]
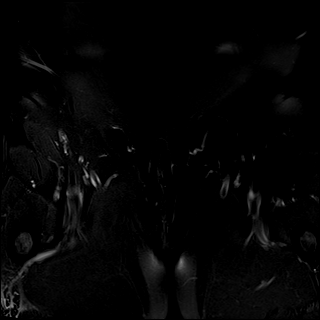
[im 16/31]
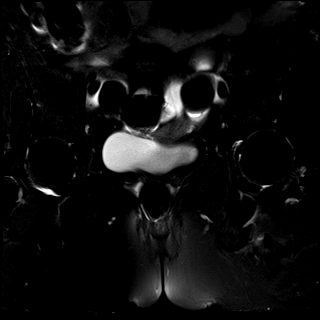
[im 31/31]
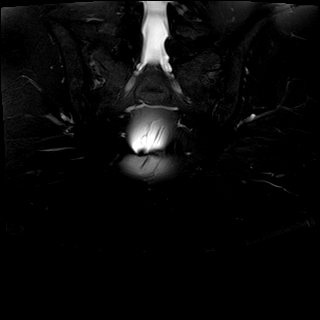

[Series 9: T1 fat-sat · axial · 1.2mm · 0.75mm/px · z∈[-67,-1]mm · 3 of 208 slices shown]
[im 11/208]
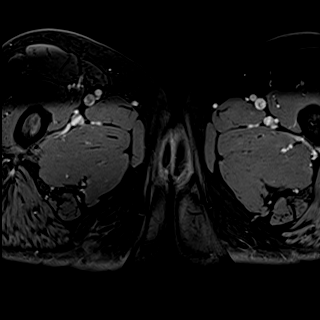
[im 33/208]
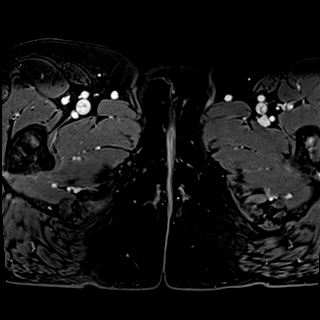
[im 66/208]
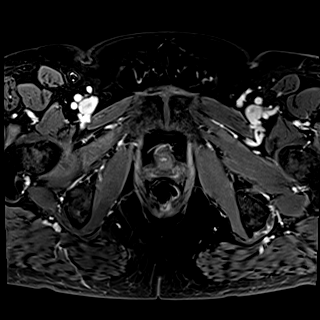

[26 of 48 positions shown; findings below may reference images not displayed]

FINDINGS: Urinary Tract:  Normal bladder and urethra.

Bowel: There is mild wall thickening and luminal narrowing within a
distal ileal loop in the right lower abdomen with associated wall
hyperenhancement (series 4/image 14 and series 9/image 65), new
since 1002 CT study, without appreciable inflammatory change in the
surrounding fat. Mild chronic wall thickening in the terminal ileum
(series 4/image 17), unchanged from 1002 CT study, without
appreciable surrounding inflammatory changes. Normal appendix.
Moderate stool throughout the visualized large bowel. No evidence of
large bowel wall thickening. There is a suggestion of a simple
linear perianal fistula in the 4-5 o'clock region tracking to the
intersphincteric space (series 7/image 18), without associated
abscess or additional fistula tract. No additional perianal or
perirectal fistula.

Vascular/Lymphatic: No pathologically enlarged pelvic lymph nodes.
No acute vascular abnormality.

Reproductive: No adnexal mass. Grossly normal atrophic anteverted
uterus.

Other:  No pelvic ascites or focal fluid collection.

Musculoskeletal: No aggressive appearing focal osseous lesions.
IMPRESSION: 1. Suggestion of Adelson Tajra grade 1 simple linear intersphincteric
perianal fistula in the 4-5 o'clock region, without associated
abscess.
2. Mild segmental wall thickening, wall hyperenhancement and luminal
narrowing in the distal ileum, suggestive of mild active Crohn
ileitis.
3. Mild chronic wall thickening in the terminal ileum without
evidence of active terminal ileitis.

## 2020-08-11 ENCOUNTER — Encounter: Payer: Self-pay | Admitting: Cardiology

## 2020-08-11 ENCOUNTER — Encounter: Payer: Self-pay | Admitting: *Deleted

## 2020-08-11 ENCOUNTER — Other Ambulatory Visit: Payer: Self-pay

## 2020-08-11 ENCOUNTER — Ambulatory Visit: Payer: BC Managed Care – PPO | Admitting: Cardiology

## 2020-08-11 DIAGNOSIS — R079 Chest pain, unspecified: Secondary | ICD-10-CM

## 2020-08-11 DIAGNOSIS — M2550 Pain in unspecified joint: Secondary | ICD-10-CM

## 2020-08-11 DIAGNOSIS — T466X5A Adverse effect of antihyperlipidemic and antiarteriosclerotic drugs, initial encounter: Secondary | ICD-10-CM

## 2020-08-11 DIAGNOSIS — I25119 Atherosclerotic heart disease of native coronary artery with unspecified angina pectoris: Secondary | ICD-10-CM | POA: Diagnosis not present

## 2020-08-11 DIAGNOSIS — E782 Mixed hyperlipidemia: Secondary | ICD-10-CM | POA: Diagnosis not present

## 2020-08-11 DIAGNOSIS — I10 Essential (primary) hypertension: Secondary | ICD-10-CM | POA: Diagnosis not present

## 2020-08-11 DIAGNOSIS — E119 Type 2 diabetes mellitus without complications: Secondary | ICD-10-CM

## 2020-08-11 DIAGNOSIS — R0609 Other forms of dyspnea: Secondary | ICD-10-CM

## 2020-08-11 DIAGNOSIS — Z95820 Peripheral vascular angioplasty status with implants and grafts: Secondary | ICD-10-CM

## 2020-08-11 DIAGNOSIS — G720 Drug-induced myopathy: Secondary | ICD-10-CM

## 2020-08-11 DIAGNOSIS — Z794 Long term (current) use of insulin: Secondary | ICD-10-CM

## 2020-08-11 DIAGNOSIS — R0600 Dyspnea, unspecified: Secondary | ICD-10-CM

## 2020-08-11 MED ORDER — ENALAPRIL MALEATE 10 MG PO TABS: 10.0000 mg | ORAL_TABLET | Freq: Every day | ORAL | 3 refills | Status: DC

## 2020-08-11 NOTE — Patient Instructions (Addendum)
Medication Instructions:  Increase enalapril 44m by mouth daily *If you need a refill on your cardiac medications before your next appointment, please call your pharmacy*   Lab Work: BMET 1 week  If you have labs (blood work) drawn today and your tests are completely normal, you will receive your results only by: .Marland KitchenMyChart Message (if you have MyChart) OR . A paper copy in the mail If you have any lab test that is abnormal or we need to change your treatment, we will call you to review the results.   Testing/Procedures:  Your physician has requested that you have a lexiscan myoview. For further information please visit [email or web address] Please follow instruction sheet, as given.   Follow-Up: [facility] and your health needs are our priority.  As part of our continuing mission to provide you with exceptional heart care, we have created designated Provider Care Teams.  These Care Teams include your primary Cardiologist (physician) and Advanced Practice Providers (APPs -  Physician Assistants and Nurse Practitioners) who all work together to provide you with the care you need, when you need it.  We recommend signing up for the patient portal called "MyChart".  Sign up information is provided on this After Visit Summary.  MyChart is used to connect with patients for Virtual Visits (Telemedicine).  Patients are able to view lab/test results, encounter notes, upcoming appointments, etc.  Non-urgent messages can be sent to your provider as well.   To learn more about what you can do with MyChart, go to [email or web address]    Your next appointment:   F/U with Dr. HGwyndolyn Kaufmanin 3 months

## 2020-08-14 ENCOUNTER — Telehealth (HOSPITAL_COMMUNITY): Payer: Self-pay | Admitting: *Deleted

## 2020-08-14 NOTE — Telephone Encounter (Signed)
Left message on voicemail per DPR in reference to upcoming appointment scheduled on 08/18/20 at 10:45 with detailed instructions given per Myocardial Perfusion Study Information Sheet for the test. LM to arrive 15 minutes early, and that it is imperative to arrive on time for appointment to keep from having the test rescheduled. If you need to cancel or reschedule your appointment, please call the office within 24 hours of your appointment. Failure to do so may result in a cancellation of your appointment, and a $50 no show fee. Phone number given for call back for any questions.

## 2020-08-18 ENCOUNTER — Ambulatory Visit (HOSPITAL_COMMUNITY): Payer: BC Managed Care – PPO | Attending: Cardiology

## 2020-08-18 ENCOUNTER — Other Ambulatory Visit: Payer: BC Managed Care – PPO | Admitting: *Deleted

## 2020-08-18 ENCOUNTER — Other Ambulatory Visit: Payer: Self-pay

## 2020-08-18 DIAGNOSIS — R079 Chest pain, unspecified: Secondary | ICD-10-CM | POA: Diagnosis not present

## 2020-08-18 LAB — MYOCARDIAL PERFUSION IMAGING
LV dias vol: 66 mL (ref 46–106)
LV sys vol: 23 mL
Peak HR: 100 {beats}/min
Rest HR: 75 {beats}/min
SDS: 0
SRS: 2
SSS: 2
TID: 1.01

## 2020-08-18 MED ORDER — REGADENOSON 0.4 MG/5ML IV SOLN
0.4000 mg | Freq: Once | INTRAVENOUS | Status: AC
  Administered 2020-08-18: 0.4 mg via INTRAVENOUS

## 2020-08-18 MED ORDER — TECHNETIUM TC 99M TETROFOSMIN IV KIT
30.2000 | PACK | Freq: Once | INTRAVENOUS | Status: AC | PRN
  Administered 2020-08-18: 30.2 via INTRAVENOUS
  Filled 2020-08-18: qty 31

## 2020-08-18 MED ORDER — TECHNETIUM TC 99M TETROFOSMIN IV KIT
10.5000 | PACK | Freq: Once | INTRAVENOUS | Status: AC | PRN
  Administered 2020-08-18: 10.5 via INTRAVENOUS
  Filled 2020-08-18: qty 11

## 2020-08-19 LAB — BASIC METABOLIC PANEL
BUN/Creatinine Ratio: 17 (ref 12–28)
BUN: 10 mg/dL (ref 8–27)
CO2: 22 mmol/L (ref 20–29)
Calcium: 8.7 mg/dL (ref 8.7–10.3)
Chloride: 101 mmol/L (ref 96–106)
Creatinine, Ser: 0.58 mg/dL (ref 0.57–1.00)
Glucose: 153 mg/dL — ABNORMAL HIGH (ref 65–99)
Potassium: 4 mmol/L (ref 3.5–5.2)
Sodium: 141 mmol/L (ref 134–144)
eGFR: 102 mL/min/{1.73_m2} (ref >59)

## 2020-08-20 ENCOUNTER — Telehealth: Payer: Self-pay | Admitting: Cardiology

## 2020-08-20 NOTE — Telephone Encounter (Signed)
Follow Up:     Pt is returning a call from yesterday, concerning her test results.

## 2020-08-20 NOTE — Telephone Encounter (Signed)
Left message to call back  

## 2020-08-21 ENCOUNTER — Ambulatory Visit: Payer: BC Managed Care – PPO | Admitting: Endocrinology

## 2020-08-22 NOTE — Telephone Encounter (Signed)
RN called patient and discussed her results, and stated per Dr. Johney Frame she was cleared for her EGD procedure. Pt stated she didn't have any needs or questions at this time.

## 2020-08-28 ENCOUNTER — Ambulatory Visit: Payer: BC Managed Care – PPO

## 2020-09-04 ENCOUNTER — Ambulatory Visit: Payer: BC Managed Care – PPO

## 2020-09-05 ENCOUNTER — Telehealth: Payer: Self-pay | Admitting: Gastroenterology

## 2020-09-05 NOTE — Telephone Encounter (Signed)
Patient reports that she has watery diarrhea that started today.  She has not had normal stool in "quite some time".  She reports that imodium doesn't help, although she has not taken any today.  She will come in and see Tye Savoy RNP on 4/12 11:00.  She will go purchase more imodium and take per package instructions until OV

## 2020-09-05 NOTE — Telephone Encounter (Signed)
Pt is still dealing with diarrhea, she reports that imodium is not helping. Pls call her.

## 2020-09-09 ENCOUNTER — Ambulatory Visit (INDEPENDENT_AMBULATORY_CARE_PROVIDER_SITE_OTHER)
Admission: RE | Admit: 2020-09-09 | Discharge: 2020-09-09 | Disposition: A | Discharge status: Home / Self Care | Payer: BC Managed Care – PPO | Source: Ambulatory Visit | Attending: Nurse Practitioner | Admitting: Nurse Practitioner

## 2020-09-09 ENCOUNTER — Encounter: Payer: Self-pay | Admitting: Nurse Practitioner

## 2020-09-09 ENCOUNTER — Other Ambulatory Visit: Payer: Self-pay

## 2020-09-09 ENCOUNTER — Ambulatory Visit (INDEPENDENT_AMBULATORY_CARE_PROVIDER_SITE_OTHER): Payer: BC Managed Care – PPO | Admitting: Nurse Practitioner

## 2020-09-09 VITALS — BP 110/72 | HR 76 | Ht 63.0 in | Wt 136.0 lb

## 2020-09-09 DIAGNOSIS — K50813 Crohn's disease of both small and large intestine with fistula: Secondary | ICD-10-CM | POA: Diagnosis not present

## 2020-09-09 DIAGNOSIS — Z9049 Acquired absence of other specified parts of digestive tract: Secondary | ICD-10-CM | POA: Diagnosis not present

## 2020-09-09 DIAGNOSIS — K5090 Crohn's disease, unspecified, without complications: Secondary | ICD-10-CM | POA: Diagnosis not present

## 2020-09-09 DIAGNOSIS — K529 Noninfective gastroenteritis and colitis, unspecified: Secondary | ICD-10-CM | POA: Diagnosis not present

## 2020-09-09 NOTE — Progress Notes (Signed)
ASSESSMENT AND PLAN    # 60 year old female with longstanding small bowel Crohn's disease maintained on Entyvio.  She was in clinical remission but in January 2022 presented with diarrhea.  Stool for C. difficile was negative . ESR was elevated at 31 at the time. She has not had much improvement on Colestid.  Persistent diarrhea could represent a Crohn's flare but first need to to be sure she isn't now having overflow diarrhea related to imodium .  --KUB to assess stool volume.  If the KUB is unremarkable then will likely proceed with colonoscopy as discussed at last visit.    HISTORY OF PRESENT ILLNESS    Chief Complaint : diarrhea  [name] is a 60 y.o. female known to Dr. Silverio Decamp with a past medical history not limited to Crohn's disease, CAD with history of DES in 2018, hypertension , diabetes, anxiety and depression   [name] has a history of Crohn's disease with predominantly small bowel involvement diagnosed in 20. She failed Humira. She was subsequently started on budesonide 9 mg daily and tapered down to 3 mg in 2018, she developed rectal pain and was noted to have a perianal fistula on MRI in September 2019. She is currently maintained on Entyvio.    [name] was last seen on 06/18/2020 for evaluation of diarrhea,  epigastric pain and black stool.  The black stool predated the use of bismuth which she was taking for the upper abdominal pain.  For diarrhea she was changed from colestipol to Prevalite. She was tapered off Budesonide since it didn't seem to be helping.   She was scheduled for an EGD.to evaluate upper abdominal pain and black stools.  Multiple labs were obtained.   Her WBC was 13.6, hemoglobin was 14.  ESR elevated at 31 .  Quantiferon gold was negative.  EGD was cancelled pending a cardiac stress test which since been done.     INTERVAL HISTORY: [name] complains of ongoing diarrhea  She only took 1 dose of the Prevalite because she does not care for drinking medication.   Currently she is taking Colestid twice daily.  She was having 3-4 loose bowel movements a day until adding Imodium a month ago.  Now after 1 dose of Imodium she will not have any bowel movements for 1 to 2 days .  The next time she has a bowel movement it is hard and followed by several loose stool at which time she takes another dose of Imodium.  Her last dose of imodium was yesterday, no BMs since.    [name] hasn't had any black stools in months and she has no abdominal pain.  She mentions that sometimes her stools are dark green but not black.   Patient has been on Metformin for years so not a likely culprit.   PREVIOUS ENDOSCOPIC EVALUATIONS / PERTINENT STUDIES:   Colonoscopy December 05, 2018 -Terminal ileal biopsies consistent with Crohn's disease. IC valve polypoid lesion status post EMR, biopsies consistent with IBD negative for dysplasia or adenomatous tissue 5 mm polyp, tubular adenoma removed from transverse colon otherwise normal colon on chromoendoscopy  Colonoscopy July 07, 2018 Nodular TI biopsied Localized nodular polypoid mucosa in IC valve biopsied, ?Adenoma on pathology report, requested additional review is pending.  EGD 08/22/2009: Esophagitis otherwise unremarkable exam   Past Medical History:  Diagnosis Date  . Allergic rhinitis due to pollen   . Allergy   . Anxiety state, unspecified   . Arthritis   . Bronchitis  hx  . CAD (coronary artery disease)    s/p NSTEMI in 11/18 treated with a DES to the proximal LAD  . Calculus of gallbladder without mention of cholecystitis or obstruction   . Crohn disease ([place])   . Depressive disorder, not elsewhere classified   . Diabetes mellitus without complication (HCC)    diet controlled  . Esophageal reflux   . Fibromyalgia   . Grade I diastolic dysfunction    Noted on ECHO  . Headache   . History of nuclear stress test    ETT-Myoview 10/17: EF 55%, normal perfusion; Low Risk // Nuclear stress test 05/2018:  EF 63,  normal perfusion; Low Risk  . Myalgia and myositis, unspecified   . NSTEMI (non-ST elevated myocardial infarction) ([place]) 04/15/2017  . Other and unspecified hyperlipidemia   . Palpitations   . Pneumonia 13   hx  . S/P angioplasty with stent 04/18/17 with DES to pLAD 04/19/2017  . Seizures ([place])    1 seizure as a teenager   . Unspecified essential hypertension   . Vitamin B deficiency     Current Medications, Allergies, Past Surgical History, Family History and Social History were reviewed in [employer] record.   Current Outpatient Medications  Medication Sig Dispense Refill  . albuterol (PROVENTIL) (2.5 MG/3ML) 0.083% nebulizer solution Take 2.5 mg by nebulization every 4 (four) hours as needed for wheezing or shortness of breath.     Marland Kitchen aspirin EC 81 MG tablet Take 81 mg by mouth daily. Swallow whole.    . busPIRone (BUSPAR) 10 MG tablet Take 10 mg by mouth 2 (two) times daily. Pt taking once a day    . colestipol (COLESTID) 1 g tablet TAKE 1 TABLET (1 G TOTAL) BY MOUTH TWO (TWO) TIMES DAILY. AS NEEDED 60 tablet 1  . DULoxetine (CYMBALTA) 60 MG capsule Take 60 mg by mouth daily.    . enalapril (VASOTEC) 10 MG tablet Take 1 tablet (10 mg total) by mouth daily. 90 tablet 3  . Evolocumab (REPATHA SURECLICK) 124 MG/ML SOAJ Inject 1 pen into the skin every 14 (fourteen) days. 6 mL 3  . fenofibrate (TRICOR) 145 MG tablet TAKE ONE TABLET BY MOUTH ONCE DAILY 90 tablet 3  . furosemide (LASIX) 40 MG tablet Take 1 tablet (40 mg total) by mouth daily as needed for fluid or edema. 30 tablet 2  . insulin glargine (LANTUS) 100 UNIT/ML injection Inject 0.1 mLs (10 Units total) into the skin at bedtime. 10 mL 11  . levocetirizine (XYZAL) 5 MG tablet Take 5 mg by mouth every evening.    . metFORMIN (GLUCOPHAGE-XR) 500 MG 24 hr tablet Take 2,000 mg by mouth every evening.    . metoprolol succinate (TOPROL XL) 25 MG 24 hr tablet Take 1 tablet (25 mg total) by mouth daily. 90 tablet  3  . montelukast (SINGULAIR) 10 MG tablet Take 10 mg by mouth at bedtime.    . Multiple Vitamins-Minerals (MULTIVITAMIN WITH MINERALS) tablet Take 1 tablet by mouth daily.    . nitroGLYCERIN (NITROSTAT) 0.4 MG SL tablet Place 0.4 mg under the tongue every 5 (five) minutes as needed for chest pain.    . pantoprazole (PROTONIX) 40 MG tablet Take 1 tablet (40 mg total) by mouth daily. 90 tablet 3  . pioglitazone (ACTOS) 30 MG tablet Take 30 mg by mouth daily.    . Probiotic Product (PROBIOTIC DAILY PO) Take 1 capsule by mouth daily.    Marland Kitchen VASCEPA 1 g  capsule Take 2 capsules (2 g total) by mouth 2 (two) times daily. 360 capsule 3  . vedolizumab (ENTYVIO) 300 MG injection Inject 300 mg into the vein every 8 (eight) weeks. After the standard induction 1 vial 6  . Vitamin D3 (VITAMIN D) 25 MCG tablet Take 1,000 Units by mouth at bedtime.     Current Facility-Administered Medications  Medication Dose Route Frequency Provider Last Rate Last Admin  . nitroGLYCERIN (NITROSTAT) SL tablet 0.3 mg  0.3 mg Sublingual Q5 min PRN [name], MD        Review of Systems: No chest pain. No shortness of breath. No urinary complaints.   PHYSICAL EXAM :    Wt Readings from Last 3 Encounters:  09/09/20 136 lb (61.7 kg)  08/18/20 140 lb (63.5 kg)  07/14/20 140 lb (63.5 kg)    Ht 5' 3"  (1.6 m)   Wt 136 lb (61.7 kg)   LMP  (LMP Unknown)   BMI 24.09 kg/m  Constitutional:  Pleasant female in no acute distress. Psychiatric: Normal mood and affect. Behavior is normal. EENT: Pupils normal.  Conjunctivae are normal. No scleral icterus. Neck supple.  Cardiovascular: Normal rate, regular rhythm. No edema Pulmonary/chest: Effort normal and breath sounds normal. No wheezing, rales or rhonchi. Abdominal: Soft, nondistended, nontender. Bowel sounds active throughout. There are no masses palpable. No hepatomegaly. Neurological: Alert and oriented to person place and time. Skin: Skin is warm and dry. No rashes  noted.  Tye Savoy, NP  09/09/2020, 11:07 AM

## 2020-09-09 NOTE — Patient Instructions (Addendum)
If you are age 62 or older, your body mass index should be between 23-30. Your Body mass index is 24.09 kg/m. If this is out of the aforementioned range listed, please consider follow up with your Primary Care Provider.  If you are age 57 or younger, your body mass index should be between 19-25. Your Body mass index is 24.09 kg/m. If this is out of the aformentioned range listed, please consider follow up with your Primary Care Provider.   Your provider has requested that you go to the basement level for a X-Ray before leaving today. Press "B" on the elevator. The Imaging is located straight ahead as you exit the elevator.  We will call you with the results of your X-ray.  You have been scheduled to follow up with Dr. Silverio Decamp on December 16, 2020 at 1:50 pm.  Thank you for entrusting me with your care and choosing [facility].  Tye Savoy, NP-C

## 2020-09-22 NOTE — Progress Notes (Signed)
Reviewed and agree with documentation and assessment and plan. K. Veena Alesia Oshields , MD   

## 2020-10-01 DIAGNOSIS — K50813 Crohn's disease of both small and large intestine with fistula: Secondary | ICD-10-CM | POA: Diagnosis not present

## 2020-10-08 DIAGNOSIS — M180 Bilateral primary osteoarthritis of first carpometacarpal joints: Secondary | ICD-10-CM | POA: Diagnosis not present

## 2020-10-08 DIAGNOSIS — M79641 Pain in right hand: Secondary | ICD-10-CM | POA: Diagnosis not present

## 2020-10-08 DIAGNOSIS — M79642 Pain in left hand: Secondary | ICD-10-CM | POA: Diagnosis not present

## 2020-10-08 DIAGNOSIS — M1812 Unilateral primary osteoarthritis of first carpometacarpal joint, left hand: Secondary | ICD-10-CM | POA: Diagnosis not present

## 2020-10-10 DIAGNOSIS — R300 Dysuria: Secondary | ICD-10-CM | POA: Diagnosis not present

## 2020-10-16 DIAGNOSIS — Z20828 Contact with and (suspected) exposure to other viral communicable diseases: Secondary | ICD-10-CM | POA: Diagnosis not present

## 2020-10-16 DIAGNOSIS — J349 Unspecified disorder of nose and nasal sinuses: Secondary | ICD-10-CM | POA: Diagnosis not present

## 2020-10-16 DIAGNOSIS — R509 Fever, unspecified: Secondary | ICD-10-CM | POA: Diagnosis not present

## 2020-10-16 DIAGNOSIS — R519 Headache, unspecified: Secondary | ICD-10-CM | POA: Diagnosis not present

## 2020-10-18 ENCOUNTER — Emergency Department (HOSPITAL_COMMUNITY): Payer: BC Managed Care – PPO

## 2020-10-18 ENCOUNTER — Emergency Department (HOSPITAL_COMMUNITY)
Admission: EM | Admit: 2020-10-18 | Discharge: 2020-10-18 | Disposition: A | Discharge status: Home / Self Care | Payer: BC Managed Care – PPO | Attending: Emergency Medicine | Admitting: Emergency Medicine

## 2020-10-18 ENCOUNTER — Other Ambulatory Visit: Payer: Self-pay

## 2020-10-18 DIAGNOSIS — Z955 Presence of coronary angioplasty implant and graft: Secondary | ICD-10-CM | POA: Insufficient documentation

## 2020-10-18 DIAGNOSIS — R7401 Elevation of levels of liver transaminase levels: Secondary | ICD-10-CM | POA: Insufficient documentation

## 2020-10-18 DIAGNOSIS — R6889 Other general symptoms and signs: Secondary | ICD-10-CM

## 2020-10-18 DIAGNOSIS — Z7984 Long term (current) use of oral hypoglycemic drugs: Secondary | ICD-10-CM | POA: Insufficient documentation

## 2020-10-18 DIAGNOSIS — R7989 Other specified abnormal findings of blood chemistry: Secondary | ICD-10-CM

## 2020-10-18 DIAGNOSIS — Z7982 Long term (current) use of aspirin: Secondary | ICD-10-CM | POA: Diagnosis not present

## 2020-10-18 DIAGNOSIS — Z9049 Acquired absence of other specified parts of digestive tract: Secondary | ICD-10-CM | POA: Diagnosis not present

## 2020-10-18 DIAGNOSIS — Z87891 Personal history of nicotine dependence: Secondary | ICD-10-CM | POA: Diagnosis not present

## 2020-10-18 DIAGNOSIS — Z20822 Contact with and (suspected) exposure to covid-19: Secondary | ICD-10-CM | POA: Diagnosis not present

## 2020-10-18 DIAGNOSIS — R509 Fever, unspecified: Secondary | ICD-10-CM | POA: Insufficient documentation

## 2020-10-18 DIAGNOSIS — R519 Headache, unspecified: Secondary | ICD-10-CM | POA: Insufficient documentation

## 2020-10-18 DIAGNOSIS — M791 Myalgia, unspecified site: Secondary | ICD-10-CM | POA: Diagnosis not present

## 2020-10-18 DIAGNOSIS — Z794 Long term (current) use of insulin: Secondary | ICD-10-CM | POA: Insufficient documentation

## 2020-10-18 DIAGNOSIS — R079 Chest pain, unspecified: Secondary | ICD-10-CM | POA: Diagnosis not present

## 2020-10-18 DIAGNOSIS — E119 Type 2 diabetes mellitus without complications: Secondary | ICD-10-CM | POA: Insufficient documentation

## 2020-10-18 DIAGNOSIS — Z79899 Other long term (current) drug therapy: Secondary | ICD-10-CM | POA: Insufficient documentation

## 2020-10-18 DIAGNOSIS — I2510 Atherosclerotic heart disease of native coronary artery without angina pectoris: Secondary | ICD-10-CM | POA: Diagnosis not present

## 2020-10-18 DIAGNOSIS — J101 Influenza due to other identified influenza virus with other respiratory manifestations: Secondary | ICD-10-CM | POA: Diagnosis not present

## 2020-10-18 DIAGNOSIS — R531 Weakness: Secondary | ICD-10-CM | POA: Diagnosis not present

## 2020-10-18 DIAGNOSIS — I700 Atherosclerosis of aorta: Secondary | ICD-10-CM | POA: Diagnosis not present

## 2020-10-18 DIAGNOSIS — R5383 Other fatigue: Secondary | ICD-10-CM | POA: Diagnosis not present

## 2020-10-18 LAB — CBC WITH DIFFERENTIAL/PLATELET
Abs Immature Granulocytes: 0.03 10*3/uL (ref 0.00–0.07)
Basophils Absolute: 0 10*3/uL (ref 0.0–0.1)
Basophils Relative: 0 %
Eosinophils Absolute: 0.1 10*3/uL (ref 0.0–0.5)
Eosinophils Relative: 2 %
HCT: 36.8 % (ref 36.0–46.0)
Hemoglobin: 11.9 g/dL — ABNORMAL LOW (ref 12.0–15.0)
Immature Granulocytes: 0 %
Lymphocytes Relative: 11 %
Lymphs Abs: 0.8 10*3/uL (ref 0.7–4.0)
MCH: 29.8 pg (ref 26.0–34.0)
MCHC: 32.3 g/dL (ref 30.0–36.0)
MCV: 92 fL (ref 80.0–100.0)
Monocytes Absolute: 0.5 10*3/uL (ref 0.1–1.0)
Monocytes Relative: 7 %
Neutro Abs: 5.8 10*3/uL (ref 1.7–7.7)
Neutrophils Relative %: 80 %
Platelets: 298 10*3/uL (ref 150–400)
RBC: 4 MIL/uL (ref 3.87–5.11)
RDW: 14.2 % (ref 11.5–15.5)
WBC: 7.4 10*3/uL (ref 4.0–10.5)
nRBC: 0 % (ref 0.0–0.2)

## 2020-10-18 LAB — COMPREHENSIVE METABOLIC PANEL
ALT: 283 U/L — ABNORMAL HIGH (ref 0–44)
AST: 280 U/L — ABNORMAL HIGH (ref 15–41)
Albumin: 3.5 g/dL (ref 3.5–5.0)
Alkaline Phosphatase: 335 U/L — ABNORMAL HIGH (ref 38–126)
Anion gap: 5 (ref 5–15)
BUN: 11 mg/dL (ref 8–23)
CO2: 27 mmol/L (ref 22–32)
Calcium: 9.1 mg/dL (ref 8.9–10.3)
Chloride: 104 mmol/L (ref 98–111)
Creatinine, Ser: 0.5 mg/dL (ref 0.44–1.00)
GFR, Estimated: >60 mL/min (ref >60)
Glucose, Bld: 153 mg/dL — ABNORMAL HIGH (ref 70–99)
Potassium: 4 mmol/L (ref 3.5–5.1)
Sodium: 136 mmol/L (ref 135–145)
Total Bilirubin: 1.5 mg/dL — ABNORMAL HIGH (ref 0.3–1.2)
Total Protein: 6.5 g/dL (ref 6.5–8.1)

## 2020-10-18 LAB — URINALYSIS, ROUTINE W REFLEX MICROSCOPIC
Bilirubin Urine: NEGATIVE
Glucose, UA: NEGATIVE mg/dL
Hgb urine dipstick: NEGATIVE
Ketones, ur: NEGATIVE mg/dL
Leukocytes,Ua: NEGATIVE
Nitrite: NEGATIVE
Protein, ur: NEGATIVE mg/dL
Specific Gravity, Urine: 1.02 (ref 1.005–1.030)
pH: 5 (ref 5.0–8.0)

## 2020-10-18 LAB — SEDIMENTATION RATE: Sed Rate: 32 mm/hr — ABNORMAL HIGH (ref 0–22)

## 2020-10-18 LAB — RESP PANEL BY RT-PCR (FLU A&B, COVID) ARPGX2
Influenza A by PCR: NEGATIVE
Influenza B by PCR: NEGATIVE
SARS Coronavirus 2 by RT PCR: NEGATIVE

## 2020-10-18 LAB — LIPASE, BLOOD: Lipase: 24 U/L (ref 11–51)

## 2020-10-18 LAB — C-REACTIVE PROTEIN: CRP: 5.6 mg/dL — ABNORMAL HIGH (ref <1.0)

## 2020-10-18 LAB — LACTIC ACID, PLASMA: Lactic Acid, Venous: 1.4 mmol/L (ref 0.5–1.9)

## 2020-10-18 LAB — CK: Total CK: 51 U/L (ref 38–234)

## 2020-10-18 MED ORDER — KETOROLAC TROMETHAMINE 30 MG/ML IJ SOLN
30.0000 mg | Freq: Once | INTRAMUSCULAR | Status: AC
  Administered 2020-10-18: 30 mg via INTRAVENOUS
  Filled 2020-10-18: qty 1

## 2020-10-18 MED ORDER — LACTATED RINGERS IV BOLUS
1000.0000 mL | Freq: Once | INTRAVENOUS | Status: AC
  Administered 2020-10-18: 1000 mL via INTRAVENOUS

## 2020-10-18 MED ORDER — ONDANSETRON HCL 4 MG/2ML IJ SOLN
4.0000 mg | Freq: Once | INTRAMUSCULAR | Status: AC
  Administered 2020-10-18: 4 mg via INTRAVENOUS
  Filled 2020-10-18: qty 2

## 2020-10-18 MED ORDER — FAMOTIDINE 20 MG PO TABS: 20.0000 mg | ORAL_TABLET | Freq: Two times a day (BID) | ORAL | 0 refills | Status: DC

## 2020-10-18 MED ORDER — ONDANSETRON 4 MG PO TBDP: 4.0000 mg | ORAL_TABLET | ORAL | 0 refills | Status: AC | PRN

## 2020-10-18 MED ORDER — OXYCODONE HCL 5 MG PO CAPS: 5.0000 mg | ORAL_CAPSULE | Freq: Four times a day (QID) | ORAL | 0 refills | Status: DC | PRN

## 2020-10-18 MED ORDER — LACTATED RINGERS IV SOLN: INTRAVENOUS | Status: DC

## 2020-10-18 NOTE — Discharge Instructions (Addendum)
1.  At this time, I suspect your symptoms are due to a viral illness.  You do have an elevation in your liver function enzymes.  At this time, the elevation is fairly mild.  There are many reasons why this can happen.  You have had some additional labs done that will not result today to help rule out certain types of hepatitis.  This may also happen in other viral illnesses as well as due to medications.  It is very important that this gets rechecked.  I recommend you follow-up with your rheumatologist as soon as possible to review these findings and determine if they think you are having any autoimmune flareup versus medication reaction versus viral illness. 2.  At this time plan will be to take medications at home for symptoms.  For significant pain, you have been given a prescription for oxycodone.  You may take 1 tablet every 4-6 hours for pain.  At this current time, do not take products containing acetaminophen (Tylenol).  Multiple over-the-counter medications might include acetaminophen as part of their formula.  Carefully review any over-the-counter medications you take. 3.  To stay hydrated and rest for the next 24 to 48 hours. 4.  Return to the emergency department if you have any worsening or concerning symptoms.

## 2020-10-18 NOTE — ED Triage Notes (Addendum)
Patient reports body aches, fever, fatigue, emesis for a week. Says she had issues like this before and had urosepsis. Patient says she had urine tested at urgent care yesterday and no significant findings. Says she got a shot of penicillin two days ago at urgent care. Patient also reports being tested for covid at urgent care and it was negative. Patient afebrile in triage, vs wnl.

## 2020-10-18 NOTE — ED Notes (Signed)
Patient reports that she was tested for COVID when seen at the urgent care 2 days ago and tested negative.

## 2020-10-18 NOTE — ED Provider Notes (Signed)
[name]   CSN: [id number] Arrival date & time: 10/18/20  1137     History Chief Complaint  Patient presents with  . Weakness  . Emesis  . Fatigue  . Generalized Body Aches    [name] is a 60 y.o. female.  HPI Patient reports has been sick for about a week.  She has had body aches and low-grade fever patient reports she has had some cough but no significant shortness of breath.  Occasional chest pains under the ribs.  She has been nauseated.  She vomited once yesterday.  She reports she is very achy all over her body.  She reports she feels achy in her shoulders and hands as well.  She reports she does have history of fibromyalgia and arthritis.  However aching has been diffuse through multiple joints and muscles.  Reports she has a generalized headache without any confusion or neck stiffness.  Patient has been seen at urgent care and got an IM dose of penicillin earlier in the week and then was put on penicillin empirically.  Patient reports it was before she had a UTI and had urosepsis with similar presentation except at that time she had confusion as well.  Patient reports she has tested negative for COVID.    Past Medical History:  Diagnosis Date  . Allergic rhinitis due to pollen   . Allergy   . Anxiety state, unspecified   . Arthritis   . Bronchitis    hx  . CAD (coronary artery disease)    s/p NSTEMI in 11/18 treated with a DES to the proximal LAD  . Calculus of gallbladder without mention of cholecystitis or obstruction   . Crohn disease ([place])   . Depressive disorder, not elsewhere classified   . Diabetes mellitus without complication (HCC)    diet controlled  . Esophageal reflux   . Fibromyalgia   . Grade I diastolic dysfunction    Noted on ECHO  . Headache   . History of nuclear stress test    ETT-Myoview 10/17: EF 55%, normal perfusion; Low Risk // Nuclear stress test 05/2018:  EF 63, normal perfusion; Low  Risk  . Myalgia and myositis, unspecified   . NSTEMI (non-ST elevated myocardial infarction) ([place]) 04/15/2017  . Other and unspecified hyperlipidemia   . Palpitations   . Pneumonia 13   hx  . S/P angioplasty with stent 04/18/17 with DES to pLAD 04/19/2017  . Seizures ([place])    1 seizure as a teenager   . Unspecified essential hypertension   . Vitamin B deficiency     Patient Active Problem List   Diagnosis Date Noted  . Acute lower UTI   . Acute encephalopathy 04/17/2020  . Nausea & vomiting 04/17/2020  . Community acquired pneumonia   . Statin myopathy 09/07/2019  . Myalgia due to HMG CoA reductase inhibitor 02/16/2019  . History of colonic polyps   . Adenomatous polyp of cecum   . Polyp of transverse colon   . Rectal pain 02/14/2018  . History of Crohn's disease 02/14/2018  . Chronic fatigue 01/14/2018  . Obstructive sleep apnea 09/17/2017  . Diabetes mellitus ([place]) 09/17/2017  . S/P angioplasty with stent 04/18/17 with DES to pLAD 04/19/2017  . NSTEMI (non-ST elevated myocardial infarction) ([place])   . SOB (shortness of breath)   . Family history of early CAD   . Chest pain 04/16/2017  . Fatty infiltration of liver 12/23/2015  . Coronary artery disease  involving native coronary artery of native heart without angina pectoris 10/10/2011  . Diabetes mellitus type 2, uncomplicated ([place]) 26/33/3545  . Essential hypertension 08/18/2009  . CHEST PAIN UNSPECIFIED 08/01/2009  . B12 DEFICIENCY 01/06/2009  . Hyperlipidemia LDL goal <70 03/16/2007  . ANXIETY 03/16/2007  . DEPRESSION 03/16/2007  . Seasonal and perennial allergic rhinitis 03/16/2007  . GERD 03/16/2007  . CHOLELITHIASIS W/O CHOLECYSTITIS W/O OBST 03/16/2007  . SACROILIITIS NEC 03/16/2007  . FIBROMYALGIA 03/16/2007  . PALPITATIONS 03/16/2007  . Crohn's disease ([place]) 03/16/2007    Past Surgical History:  Procedure Laterality Date  . BIOPSY  12/05/2018   Procedure: BIOPSY;  Surgeon: Mauri Pole, MD;   Location: [facility];  Service: Endoscopy;;  . CHOLECYSTECTOMY N/A 09/19/2014   Procedure: LAPAROSCOPIC CHOLECYSTECTOMY WITH INTRAOPERATIVE CHOLANGIOGRAM;  Surgeon: Donnie Mesa, MD;  Location: [facility];  Service: General;  Laterality: N/A;  . COLONOSCOPY WITH PROPOFOL N/A 12/05/2018   Procedure: COLONOSCOPY WITH PROPOFOL;  Surgeon: Mauri Pole, MD;  Location: [facility];  Service: Endoscopy;  Laterality: N/A;  chromoendoscopy  . CORONARY STENT INTERVENTION N/A 04/18/2017   Procedure: CORONARY STENT INTERVENTION;  Surgeon: Martinique, Peter M, MD;  Location: [facility];  Service: Cardiovascular;  Laterality: N/A;  . ESOPHAGOGASTRODUODENOSCOPY    . EXPLORATORY LAPAROTOMY     for endometriosis  . INTRAVASCULAR PRESSURE WIRE/FFR STUDY N/A 04/18/2017   Procedure: INTRAVASCULAR PRESSURE WIRE/FFR STUDY;  Surgeon: Martinique, Peter M, MD;  Location: [facility];  Service: Cardiovascular;  Laterality: N/A;  . LEFT HEART CATH AND CORONARY ANGIOGRAPHY N/A 04/18/2017   Procedure: LEFT HEART CATH AND CORONARY ANGIOGRAPHY;  Surgeon: Martinique, Peter M, MD;  Location: [facility];  Service: Cardiovascular;  Laterality: N/A;  . POLYPECTOMY  12/05/2018   Procedure: POLYPECTOMY;  Surgeon: Mauri Pole, MD;  Location: [facility];  Service: Endoscopy;;  . SUBMUCOSAL LIFTING INJECTION  12/05/2018   Procedure: SUBMUCOSAL LIFTING INJECTION;  Surgeon: Mauri Pole, MD;  Location: [facility];  Service: Endoscopy;;  . TONSILLECTOMY AND ADENOIDECTOMY    . TUBAL LIGATION     BILATERAL     OB History   No obstetric history on file.     Family History  Problem Relation Age of Onset  . Lung cancer Maternal Grandmother   . Stroke Father   . Hypertension Mother   . Heart attack Mother   . Heart disease Mother   . Hypertension Brother   . Heart disease Brother   . Hypertension Sister   . Heart disease Sister   . Coronary artery disease Brother   . Colon cancer Neg Hx   .  Stomach cancer Neg Hx   . Esophageal cancer Neg Hx   . Rectal cancer Neg Hx     Social History   Tobacco Use  . Smoking status: Former Smoker    Packs/day: 1.00    Years: 35.00    Pack years: 35.00    Types: Cigarettes    Quit date: 05/31/2004    Years since quitting: 16.3  . Smokeless tobacco: Never Used  Vaping Use  . Vaping Use: Never used  Substance Use Topics  . Alcohol use: No  . Drug use: No    Home Medications Prior to Admission medications   Medication Sig Start Date End Date Taking? Authorizing Provider  acetaminophen (TYLENOL) 650 MG CR tablet Take 650 mg by mouth every 8 (eight) hours as needed for pain.   Yes [provider]  albuterol (  PROVENTIL) (2.5 MG/3ML) 0.083% nebulizer solution Take 2.5 mg by nebulization every 4 (four) hours as needed for wheezing or shortness of breath.    Yes [provider]  aspirin EC 81 MG tablet Take 81 mg by mouth daily. Swallow whole.   Yes [provider]  busPIRone (BUSPAR) 10 MG tablet Take 10 mg by mouth 2 (two) times daily.   Yes [provider]  colestipol (COLESTID) 1 g tablet TAKE 1 TABLET (1 G TOTAL) BY MOUTH TWO (TWO) TIMES DAILY. AS NEEDED Patient taking differently: Take 1 g by mouth daily. 07/16/20  Yes Nandigam, Venia Minks, MD  doxycycline (VIBRA-TABS) 100 MG tablet Take 100 mg by mouth 2 (two) times daily. Start date : 05 /19/22 10/16/20  Yes [provider]  DULoxetine (CYMBALTA) 60 MG capsule Take 60 mg by mouth daily.   Yes [provider]  enalapril (VASOTEC) 10 MG tablet Take 1 tablet (10 mg total) by mouth daily. 08/11/20  Yes Pemberton, Greer Ee, MD  Evolocumab (REPATHA SURECLICK) 174 MG/ML SOAJ Inject 1 pen into the skin every 14 (fourteen) days. 07/14/20  Yes Freada Bergeron, MD  famotidine (PEPCID) 20 MG tablet Take 1 tablet (20 mg total) by mouth 2 (two) times daily. 10/18/20  Yes Devondre Guzzetta, Jeannie Done, MD  fenofibrate (TRICOR) 145 MG tablet TAKE ONE TABLET BY  MOUTH ONCE DAILY Patient taking differently: Take 145 mg by mouth daily. 08/20/19  Yes Dorothy Spark, MD  furosemide (LASIX) 40 MG tablet Take 1 tablet (40 mg total) by mouth daily as needed for fluid or edema. 11/27/19  Yes Dorothy Spark, MD  insulin glargine (LANTUS) 100 UNIT/ML injection Inject 0.1 mLs (10 Units total) into the skin at bedtime. Patient taking differently: Inject 48 Units into the skin at bedtime. 04/18/20  Yes Elodia Florence., MD  levocetirizine (XYZAL) 5 MG tablet Take 5 mg by mouth every evening.   Yes [provider]  metFORMIN (GLUCOPHAGE-XR) 500 MG 24 hr tablet Take 2,000 mg by mouth every evening.   Yes [provider]  metoprolol succinate (TOPROL XL) 25 MG 24 hr tablet Take 1 tablet (25 mg total) by mouth daily. 07/29/20  Yes Freada Bergeron, MD  montelukast (SINGULAIR) 10 MG tablet Take 10 mg by mouth at bedtime.   Yes [provider]  Multiple Vitamins-Minerals (MULTIVITAMIN WITH MINERALS) tablet Take 1 tablet by mouth daily.   Yes [provider]  nitroGLYCERIN (NITROSTAT) 0.4 MG SL tablet Place 0.4 mg under the tongue every 5 (five) minutes as needed for chest pain.   Yes [provider]  ondansetron (ZOFRAN ODT) 4 MG disintegrating tablet Take 1 tablet (4 mg total) by mouth every 4 (four) hours as needed for nausea or vomiting. 10/18/20  Yes Keyonni Percival, Jeannie Done, MD  oxycodone (OXY-IR) 5 MG capsule Take 1 capsule (5 mg total) by mouth every 6 (six) hours as needed. 10/18/20  Yes Charlesetta Shanks, MD  pantoprazole (PROTONIX) 40 MG tablet Take 1 tablet (40 mg total) by mouth daily. 07/29/20  Yes Freada Bergeron, MD  pioglitazone (ACTOS) 30 MG tablet Take 30 mg by mouth daily.   Yes [provider]  Probiotic Product (PROBIOTIC DAILY PO) Take 1 capsule by mouth daily.   Yes [provider]  sodium chloride (OCEAN) 0.65 % SOLN nasal spray Place 1 spray into both nostrils daily as needed for  congestion.   Yes [provider]  VASCEPA 1 g capsule Take 2 capsules (2  g total) by mouth 2 (two) times daily. 07/17/20  Yes Freada Bergeron, MD  vedolizumab (ENTYVIO) 300 MG injection Inject 300 mg into the vein every 8 (eight) weeks. After the standard induction 02/21/18  Yes Nandigam, Venia Minks, MD  Vitamin D3 (VITAMIN D) 25 MCG tablet Take 1,000 Units by mouth at bedtime.   Yes [provider]  EPIPEN 2-PAK 0.3 MG/0.3ML SOAJ injection Inject 0.3 mg into the muscle as needed for anaphylaxis. Reported on 06/27/2015 10/09/14   [provider]    Allergies    Lipitor [atorvastatin], Remicade [infliximab], Ticagrelor, and Rosuvastatin  Review of Systems   Review of Systems 10 Systems reviewed and negative except as per HPI Physical Exam Updated Vital Signs BP 133/63   Pulse 81   Temp 98.1 F (36.7 C) (Oral)   Resp 18   Ht 5' 2"  (1.575 m)   Wt 59.9 kg   LMP  (LMP Unknown)   SpO2 97%   BMI 24.14 kg/m   Physical Exam Constitutional:      Appearance: She is well-developed.  HENT:     Head: Normocephalic and atraumatic.  Eyes:     Pupils: Pupils are equal, round, and reactive to light.  Cardiovascular:     Rate and Rhythm: Normal rate and regular rhythm.     Heart sounds: Normal heart sounds.  Pulmonary:     Effort: Pulmonary effort is normal.     Breath sounds: Normal breath sounds.  Abdominal:     General: Bowel sounds are normal. There is no distension.     Palpations: Abdomen is soft.     Tenderness: There is no abdominal tenderness.  Musculoskeletal:        General: Normal range of motion.     Cervical back: Neck supple.  Skin:    General: Skin is warm and dry.     Findings: No rash.  Neurological:     General: No focal deficit present.     Mental Status: She is alert and oriented to person, place, and time.     GCS: GCS eye subscore is 4. GCS verbal subscore is 5. GCS motor subscore is 6.     Coordination: Coordination normal.   Psychiatric:        Mood and Affect: Mood normal.     ED Results / Procedures / Treatments   Labs (all labs ordered are listed, but only abnormal results are displayed) Labs Reviewed  COMPREHENSIVE METABOLIC PANEL - Abnormal; Notable for the following components:      Result Value   Glucose, Bld 153 (*)    AST 280 (*)    ALT 283 (*)    Alkaline Phosphatase 335 (*)    Total Bilirubin 1.5 (*)    All other components within normal limits  CBC WITH DIFFERENTIAL/PLATELET - Abnormal; Notable for the following components:   Hemoglobin 11.9 (*)    All other components within normal limits  URINALYSIS, ROUTINE W REFLEX MICROSCOPIC - Abnormal; Notable for the following components:   Color, Urine AMBER (*)    All other components within normal limits  SEDIMENTATION RATE - Abnormal; Notable for the following components:   Sed Rate 32 (*)    All other components within normal limits  C-REACTIVE PROTEIN - Abnormal; Notable for the following components:   CRP 5.6 (*)    All other components within normal limits  RESP PANEL BY RT-PCR (FLU A&B, COVID) [id number]  URINE CULTURE  LIPASE, BLOOD  LACTIC ACID,  PLASMA  CK  HEPATITIS PANEL, ACUTE    EKG EKG Interpretation  Date/Time:  Saturday Oct 18 2020 11:56:19 EDT Ventricular Rate:  82 PR Interval:  143 QRS Duration: 95 QT Interval:  412 QTC Calculation: 482 R Axis:   90 Text Interpretation: Sinus rhythm Borderline right axis deviation since last tracing no significant change Confirmed by Daleen Bo [phone]) on 10/18/2020 1:17:23 PM   Radiology CT Abdomen Pelvis Wo Contrast  Result Date: 10/18/2020 CLINICAL DATA:  Fever.  History of Crohn's disease EXAM: CT ABDOMEN AND PELVIS WITHOUT CONTRAST TECHNIQUE: Multidetector CT imaging of the abdomen and pelvis was performed following the standard protocol without IV contrast. COMPARISON:  04/18/2019 FINDINGS: Lower chest: Lung bases are clear. No effusions. Heart is normal size.  Hepatobiliary: No focal liver abnormality is seen. Status post cholecystectomy. No biliary dilatation. Pancreas: No focal abnormality or ductal dilatation. Spleen: No focal abnormality.  Normal size. Adrenals/Urinary Tract: No adrenal abnormality. No focal renal abnormality. No stones or hydronephrosis. Urinary bladder is unremarkable. Stomach/Bowel: Stomach, large and small bowel grossly unremarkable. Appendix not visualized. Vascular/Lymphatic: Diffuse aortoiliac atherosclerosis. No evidence of aneurysm or adenopathy. Reproductive: Uterus and adnexa unremarkable.  No mass. Other: No free fluid or free air. Musculoskeletal: No acute bony abnormality. IMPRESSION: No acute findings in the abdomen or pelvis. Aortoiliac atherosclerosis. Electronically Signed   By: Rolm Baptise M.D.   On: 10/18/2020 16:26   DG Chest Port 1 View  Result Date: 10/18/2020 CLINICAL DATA:  60 year old female with fever, body aches and fatigue. EXAM: PORTABLE CHEST 1 VIEW COMPARISON:  04/18/2020 and prior radiographs FINDINGS: The cardiomediastinal silhouette is unremarkable. There is no evidence of focal airspace disease, pulmonary edema, suspicious pulmonary nodule/mass, pleural effusion, or pneumothorax. No acute bony abnormalities are identified. IMPRESSION: No active disease. Electronically Signed   By: Margarette Canada M.D.   On: 10/18/2020 14:14    Procedures Procedures   Medications Ordered in ED Medications  lactated ringers bolus 1,000 mL (0 mLs Intravenous Stopped 10/18/20 1616)  ondansetron (ZOFRAN) injection 4 mg (4 mg Intravenous Given 10/18/20 1327)  ketorolac (TORADOL) 30 MG/ML injection 30 mg (30 mg Intravenous Given 10/18/20 1327)    ED Course  I have reviewed the triage vital signs and the nursing notes.  Pertinent labs & imaging results that were available during my care of the patient were reviewed by me and considered in my medical decision making (see chart for details).    MDM Rules/Calculators/A&P                           Patient has diffuse constitutional symptoms.  He has significant myalgias and arthralgias.  Physical exam is normal without any apparent effusions or redness of the joints or soft tissues.  Patient vomited once yesterday.  Clinically she is well in appearance.  Patient had concern for possible sepsis due to a prior episode of urosepsis.  At this time no sign of septic presentation.  Vital signs are stable, clinical appearance is well.  No leukocytosis or elevated lactic.  LFTs are mildly elevated.  Patient reports she has already had cholecystectomy.  She does not have significantly reproducible right upper quadrant pain.  Consideration given to possible hepatitis A presentation although no apparent risk factors and no aggressive vomiting.  No apparent risk factors for hepatitis B or C.  No drug use.  No distant history of drug use.  At this time, with patient clinically well in appearance  and labs without signs of leukocytosis, lactic acidosis or other significant electrolyte derangement, patient stable for continued home management with symptomatic treatment.  She does report a lot of diffuse pain.  Patient cannot take acetaminophen based on her LFTs at this time.  Will provide short course of oxycodone, Pepcid and antiemetic.  I have recommended close follow-up and monitoring of the LFTs with the patient's rheumatologist and and PCP. Final Clinical Impression(s) / ED Diagnoses Final diagnoses:  Myalgia  Flu-like symptoms  LFT elevation    Rx / DC Orders ED Discharge Orders         Ordered    oxycodone (OXY-IR) 5 MG capsule  Every 6 hours PRN        10/18/20 1719    ondansetron (ZOFRAN ODT) 4 MG disintegrating tablet  Every 4 hours PRN        10/18/20 1719    famotidine (PEPCID) 20 MG tablet  2 times daily        10/18/20 1719           Charlesetta Shanks, MD 10/18/20 [phone]

## 2020-10-19 LAB — HEPATITIS PANEL, ACUTE
HCV Ab: NONREACTIVE
Hep A IgM: NONREACTIVE
Hep B C IgM: NONREACTIVE
Hepatitis B Surface Ag: NONREACTIVE

## 2020-10-20 ENCOUNTER — Other Ambulatory Visit: Payer: Self-pay

## 2020-10-20 ENCOUNTER — Telehealth: Payer: Self-pay | Admitting: Nurse Practitioner

## 2020-10-20 DIAGNOSIS — R7989 Other specified abnormal findings of blood chemistry: Secondary | ICD-10-CM

## 2020-10-20 DIAGNOSIS — M7989 Other specified soft tissue disorders: Secondary | ICD-10-CM | POA: Diagnosis not present

## 2020-10-20 DIAGNOSIS — R1011 Right upper quadrant pain: Secondary | ICD-10-CM

## 2020-10-20 DIAGNOSIS — R5383 Other fatigue: Secondary | ICD-10-CM | POA: Diagnosis not present

## 2020-10-20 DIAGNOSIS — M797 Fibromyalgia: Secondary | ICD-10-CM | POA: Diagnosis not present

## 2020-10-20 DIAGNOSIS — M2550 Pain in unspecified joint: Secondary | ICD-10-CM | POA: Diagnosis not present

## 2020-10-20 DIAGNOSIS — R413 Other amnesia: Secondary | ICD-10-CM | POA: Diagnosis not present

## 2020-10-20 LAB — URINE CULTURE: Culture: NO GROWTH

## 2020-10-20 NOTE — Telephone Encounter (Signed)
Patient seen in the ED. See the note and her labs. She continues to be tired and nauseous. She has dark urine. She was seen by her Rheumatologist today who asked her to call us as well. I have her coming in to see you on Wednesday 10/22/20. Do you want a repeat of labs or anything before the appointment?

## 2020-10-20 NOTE — Telephone Encounter (Signed)
Inbound call from patient requesting a call from a nurse please.  States was seen at the hospital recently and her liver enzymes were elevated.

## 2020-10-20 NOTE — Telephone Encounter (Signed)
Orders in [place]. Called the patient. No answer. Left her a message.

## 2020-10-20 NOTE — Telephone Encounter (Signed)
Please have her recheck labs CMP, CK, LDH, CRP, PT/INR and Ammonia. Thanks

## 2020-10-21 ENCOUNTER — Other Ambulatory Visit (INDEPENDENT_AMBULATORY_CARE_PROVIDER_SITE_OTHER): Payer: BC Managed Care – PPO

## 2020-10-21 DIAGNOSIS — R7989 Other specified abnormal findings of blood chemistry: Secondary | ICD-10-CM

## 2020-10-21 DIAGNOSIS — R1011 Right upper quadrant pain: Secondary | ICD-10-CM | POA: Diagnosis not present

## 2020-10-21 LAB — CK: Total CK: 9 U/L (ref 7–177)

## 2020-10-21 LAB — HIGH SENSITIVITY CRP: CRP, High Sensitivity: 55.52 mg/L — ABNORMAL HIGH (ref 0.000–5.000)

## 2020-10-21 LAB — COMPREHENSIVE METABOLIC PANEL
ALT: 118 U/L — ABNORMAL HIGH (ref 0–35)
AST: 34 U/L (ref 0–37)
Albumin: 3.7 g/dL (ref 3.5–5.2)
Alkaline Phosphatase: 332 U/L — ABNORMAL HIGH (ref 39–117)
BUN: 11 mg/dL (ref 6–23)
CO2: 27 mEq/L (ref 19–32)
Calcium: 9.6 mg/dL (ref 8.4–10.5)
Chloride: 103 mEq/L (ref 96–112)
Creatinine, Ser: 0.45 mg/dL (ref 0.40–1.20)
GFR: 103.15 mL/min (ref >60.00)
Glucose, Bld: 172 mg/dL — ABNORMAL HIGH (ref 70–99)
Potassium: 3.9 mEq/L (ref 3.5–5.1)
Sodium: 141 mEq/L (ref 135–145)
Total Bilirubin: 1 mg/dL (ref 0.2–1.2)
Total Protein: 6.5 g/dL (ref 6.0–8.3)

## 2020-10-21 LAB — PROTIME-INR
INR: 1 ratio (ref 0.8–1.0)
Prothrombin Time: 11.7 s (ref 9.6–13.1)

## 2020-10-22 ENCOUNTER — Encounter: Payer: Self-pay | Admitting: Gastroenterology

## 2020-10-22 ENCOUNTER — Ambulatory Visit: Payer: BC Managed Care – PPO | Admitting: Gastroenterology

## 2020-10-22 VITALS — BP 124/60 | HR 79 | Ht 62.0 in | Wt 134.0 lb

## 2020-10-22 DIAGNOSIS — R5383 Other fatigue: Secondary | ICD-10-CM | POA: Diagnosis not present

## 2020-10-22 DIAGNOSIS — M791 Myalgia, unspecified site: Secondary | ICD-10-CM | POA: Diagnosis not present

## 2020-10-22 DIAGNOSIS — E538 Deficiency of other specified B group vitamins: Secondary | ICD-10-CM

## 2020-10-22 DIAGNOSIS — K5090 Crohn's disease, unspecified, without complications: Secondary | ICD-10-CM | POA: Diagnosis not present

## 2020-10-22 DIAGNOSIS — R7989 Other specified abnormal findings of blood chemistry: Secondary | ICD-10-CM

## 2020-10-22 DIAGNOSIS — R945 Abnormal results of liver function studies: Secondary | ICD-10-CM

## 2020-10-22 DIAGNOSIS — B349 Viral infection, unspecified: Secondary | ICD-10-CM

## 2020-10-22 LAB — LACTATE DEHYDROGENASE: LDH: 134 IU/L (ref 119–226)

## 2020-10-22 LAB — AMMONIA: Ammonia: 73 umol/L — ABNORMAL HIGH (ref <=72)

## 2020-10-22 MED ORDER — CYANOCOBALAMIN 1000 MCG/ML IJ SOLN: 1000.0000 ug | Freq: Once | INTRAMUSCULAR | 11 refills | Status: AC

## 2020-10-22 NOTE — Patient Instructions (Signed)
You will need labs (Hepatic and CRP) in 2 weeks and again in 4 weeks, orders will be in for you, just come to our lab at your convenience  We will send Vitamin B12 injectable medication to your pharmacy, Take 1000 mcg every two weeks for 1 month  then once a month   Continue Multivitamins   Keep [place] appointment with Dr Silverio Decamp  Due to recent changes in healthcare laws, you may see the results of your imaging and laboratory studies on MyChart before your provider has had a chance to review them.  We understand that in some cases there may be results that are confusing or concerning to you. Not all laboratory results come back in the same time frame and the provider may be waiting for multiple results in order to interpret others.  Please give [place] 48 hours in order for your provider to thoroughly review all the results before contacting the office for clarification of your results.   I appreciate the  opportunity to care for you  Thank You   Harl Bowie , MD

## 2020-10-22 NOTE — Progress Notes (Signed)
[name]    [id number]    [birth year]  Primary Care Physician:Patient, No Pcp Per (Inactive)  Referring Physician: No referring provider defined for this encounter.   Chief complaint:  Myalgia, elevated LFT  HPI:  60 year old very pleasant female with history of Crohn's disease on Entyvio for maintenance therapy here for follow-up visit for abnormal LFT, fatigue and myalgia.  She was in the ER last week for fever and flulike illness, tested negative for COVID and influenza.  She continued to have severe fatigue and myalgia slightly better in the past 1 to 2 days Denies any abdominal pain, melena or rectal bleeding.  . Hepatic Function Latest Ref Rng & Units 10/21/2020 10/18/2020 06/18/2020  Total Protein 6.0 - 8.3 g/dL 6.5 6.5 6.8  Albumin 3.5 - 5.2 g/dL 3.7 3.5 4.4  AST 0 - 37 U/L 34 280(H) 22  ALT 0 - 35 U/L 118(H) 283(H) 28  Alk Phosphatase 39 - 117 U/L 332(H) 335(H) 88  Total Bilirubin 0.2 - 1.2 mg/dL 1.0 1.5(H) 0.4  Bilirubin, Direct 0.00 - 0.40 mg/dL - - -     She hadNSTEMIin November 2018, status post coronary stent placement (DES)  Colonoscopy December 05, 2018:Terminal ileal biopsies consistent with Crohn's disease. IC valve polypoid lesion status post EMR, biopsies consistent with IBD negative for dysplasia or adenomatous tissue 5 mm polyp, tubular adenoma removed from transverse colon otherwise normal colon on chromoendoscopy  Colonoscopy July 07, 2018 Nodular TI biopsied Localized nodular polypoid mucosa in IC valve biopsied, ?Adenoma on pathology report, requested additional review is pending.  EGD 08/22/2009: Esophagitis otherwise unremarkable exam   MRI pelvis February 14, 2018 showed grade 1 simple linear intersphincteric perianal fistula without associated abscess. Mild segmental wall thickening and luminal narrowing in the distal ileum suggestive of mild active Crohn's ileitis. Mild chronic wall thickening in the terminal ileum  without active inflammatory changes.  Relevant GI history: Crohn's disease with predominant small bowel involvement diagnosed in 1991. Initially was managed with prednisone, had significant side effects. Subsequently was in clinical remission on 6-MP. 6-MP was discontinued in 2017 due to persistently elevated transaminases, have improved after discontinuing 6-MP. She was started on Humira, clinically failed with persistent symptoms. Had undetectable drug trough with elevated antibodies. Started on budesonide 9 mg daily and taper down to 3 mg daily in 2018. Developed rectal pain and noted to have perianal fistula on MRI pelvis September 2019.   Outpatient Encounter Medications as of 10/22/2020  Medication Sig  . acetaminophen (TYLENOL) 650 MG CR tablet Take 650 mg by mouth every 8 (eight) hours as needed for pain.  Marland Kitchen albuterol (PROVENTIL) (2.5 MG/3ML) 0.083% nebulizer solution Take 2.5 mg by nebulization every 4 (four) hours as needed for wheezing or shortness of breath.   Marland Kitchen aspirin EC 81 MG tablet Take 81 mg by mouth daily. Swallow whole.  . busPIRone (BUSPAR) 10 MG tablet Take 10 mg by mouth 2 (two) times daily.  . colestipol (COLESTID) 1 g tablet TAKE 1 TABLET (1 G TOTAL) BY MOUTH TWO (TWO) TIMES DAILY. AS NEEDED (Patient taking differently: Take 1 g by mouth daily.)  . DULoxetine (CYMBALTA) 60 MG capsule Take 60 mg by mouth daily.  . enalapril (VASOTEC) 10 MG tablet Take 1 tablet (10 mg total) by mouth daily.  . Evolocumab (REPATHA SURECLICK) 124 MG/ML SOAJ Inject 1 pen into the skin every 14 (fourteen) days.  . famotidine (PEPCID) 20 MG tablet Take 1  tablet (20 mg total) by mouth 2 (two) times daily.  . fenofibrate (TRICOR) 145 MG tablet TAKE ONE TABLET BY MOUTH ONCE DAILY (Patient taking differently: Take 145 mg by mouth daily.)  . furosemide (LASIX) 40 MG tablet Take 1 tablet (40 mg total) by mouth daily as needed for fluid or edema.  . insulin glargine (LANTUS) 100 UNIT/ML injection  Inject 0.1 mLs (10 Units total) into the skin at bedtime. (Patient taking differently: Inject 48 Units into the skin at bedtime.)  . levocetirizine (XYZAL) 5 MG tablet Take 5 mg by mouth every evening.  . metFORMIN (GLUCOPHAGE-XR) 500 MG 24 hr tablet Take 2,000 mg by mouth every evening.  . metoprolol succinate (TOPROL XL) 25 MG 24 hr tablet Take 1 tablet (25 mg total) by mouth daily.  . montelukast (SINGULAIR) 10 MG tablet Take 10 mg by mouth at bedtime.  . Multiple Vitamins-Minerals (MULTIVITAMIN WITH MINERALS) tablet Take 1 tablet by mouth daily.  . nitroGLYCERIN (NITROSTAT) 0.4 MG SL tablet Place 0.4 mg under the tongue every 5 (five) minutes as needed for chest pain.  Marland Kitchen ondansetron (ZOFRAN ODT) 4 MG disintegrating tablet Take 1 tablet (4 mg total) by mouth every 4 (four) hours as needed for nausea or vomiting.  . pantoprazole (PROTONIX) 40 MG tablet Take 1 tablet (40 mg total) by mouth daily.  . pioglitazone (ACTOS) 30 MG tablet Take 30 mg by mouth daily.  . Probiotic Product (PROBIOTIC DAILY PO) Take 1 capsule by mouth daily.  . sodium chloride (OCEAN) 0.65 % SOLN nasal spray Place 1 spray into both nostrils daily as needed for congestion.  Marland Kitchen VASCEPA 1 g capsule Take 2 capsules (2 g total) by mouth 2 (two) times daily.  . vedolizumab (ENTYVIO) 300 MG injection Inject 300 mg into the vein every 8 (eight) weeks. After the standard induction  . Vitamin D3 (VITAMIN D) 25 MCG tablet Take 1,000 Units by mouth at bedtime.  . [DISCONTINUED] doxycycline (VIBRA-TABS) 100 MG tablet Take 100 mg by mouth 2 (two) times daily. Start date : 05 /19/22  . [DISCONTINUED] EPIPEN 2-PAK 0.3 MG/0.3ML SOAJ injection Inject 0.3 mg into the muscle as needed for anaphylaxis. Reported on 06/27/2015  . [DISCONTINUED] oxycodone (OXY-IR) 5 MG capsule Take 1 capsule (5 mg total) by mouth every 6 (six) hours as needed.   Facility-Administered Encounter Medications as of 10/22/2020  Medication  . nitroGLYCERIN (NITROSTAT) SL  tablet 0.3 mg    Allergies as of 10/22/2020 - Review Complete 10/18/2020  Allergen Reaction Noted  . Lipitor [atorvastatin] Other (See Comments) 03/28/2013  . Remicade [infliximab]    . Ticagrelor  09/16/2017  . Rosuvastatin Other (See Comments) 01/25/2019    Past Medical History:  Diagnosis Date  . Allergic rhinitis due to pollen   . Allergy   . Anxiety state, unspecified   . Arthritis   . Bronchitis    hx  . CAD (coronary artery disease)    s/p NSTEMI in 11/18 treated with a DES to the proximal LAD  . Calculus of gallbladder without mention of cholecystitis or obstruction   . Crohn disease ([place])   . Depressive disorder, not elsewhere classified   . Diabetes mellitus without complication (HCC)    diet controlled  . Esophageal reflux   . Fibromyalgia   . Grade I diastolic dysfunction    Noted on ECHO  . Headache   . History of nuclear stress test    ETT-Myoview 10/17: EF 55%, normal perfusion; Low Risk // Nuclear  stress test 05/2018:  EF 63, normal perfusion; Low Risk  . Myalgia and myositis, unspecified   . NSTEMI (non-ST elevated myocardial infarction) ([place]) 04/15/2017  . Other and unspecified hyperlipidemia   . Palpitations   . Pneumonia 13   hx  . S/P angioplasty with stent 04/18/17 with DES to pLAD 04/19/2017  . Seizures ([place])    1 seizure as a teenager   . Unspecified essential hypertension   . Vitamin B deficiency     Past Surgical History:  Procedure Laterality Date  . BIOPSY  12/05/2018   Procedure: BIOPSY;  Surgeon: Mauri Pole, MD;  Location: [facility];  Service: Endoscopy;;  . CHOLECYSTECTOMY N/A 09/19/2014   Procedure: LAPAROSCOPIC CHOLECYSTECTOMY WITH INTRAOPERATIVE CHOLANGIOGRAM;  Surgeon: Donnie Mesa, MD;  Location: [facility];  Service: General;  Laterality: N/A;  . COLONOSCOPY WITH PROPOFOL N/A 12/05/2018   Procedure: COLONOSCOPY WITH PROPOFOL;  Surgeon: Mauri Pole, MD;  Location: [facility];  Service: Endoscopy;  Laterality: N/A;   chromoendoscopy  . CORONARY STENT INTERVENTION N/A 04/18/2017   Procedure: CORONARY STENT INTERVENTION;  Surgeon: Martinique, Peter M, MD;  Location: [facility];  Service: Cardiovascular;  Laterality: N/A;  . ESOPHAGOGASTRODUODENOSCOPY    . EXPLORATORY LAPAROTOMY     for endometriosis  . INTRAVASCULAR PRESSURE WIRE/FFR STUDY N/A 04/18/2017   Procedure: INTRAVASCULAR PRESSURE WIRE/FFR STUDY;  Surgeon: Martinique, Peter M, MD;  Location: [facility];  Service: Cardiovascular;  Laterality: N/A;  . LEFT HEART CATH AND CORONARY ANGIOGRAPHY N/A 04/18/2017   Procedure: LEFT HEART CATH AND CORONARY ANGIOGRAPHY;  Surgeon: Martinique, Peter M, MD;  Location: [facility];  Service: Cardiovascular;  Laterality: N/A;  . POLYPECTOMY  12/05/2018   Procedure: POLYPECTOMY;  Surgeon: Mauri Pole, MD;  Location: [facility];  Service: Endoscopy;;  . SUBMUCOSAL LIFTING INJECTION  12/05/2018   Procedure: SUBMUCOSAL LIFTING INJECTION;  Surgeon: Mauri Pole, MD;  Location: [facility];  Service: Endoscopy;;  . TONSILLECTOMY AND ADENOIDECTOMY    . TUBAL LIGATION     BILATERAL    Family History  Problem Relation Age of Onset  . Lung cancer Maternal Grandmother   . Stroke Father   . Hypertension Mother   . Heart attack Mother   . Heart disease Mother   . Hypertension Brother   . Heart disease Brother   . Hypertension Sister   . Heart disease Sister   . Coronary artery disease Brother   . Colon cancer Neg Hx   . Stomach cancer Neg Hx   . Esophageal cancer Neg Hx   . Rectal cancer Neg Hx     Social History   Socioeconomic History  . Marital status: Married    Spouse name: Dane  . Number of children: 2  . Years of education: 12+  . Highest education level: Not on file  Occupational History  . Occupation: ACCOUNT REP.    Employer: [employer]  Tobacco Use  . Smoking status: Former Smoker    Packs/day: 1.00    Years: 35.00    Pack years: 35.00    Types:  Cigarettes    Quit date: 05/31/2004    Years since quitting: 16.4  . Smokeless tobacco: Never Used  Vaping Use  . Vaping Use: Never used  Substance and Sexual Activity  . Alcohol use: No  . Drug use: No  . Sexual activity: Not Currently    Comment: married  Other Topics Concern  . Not on file  Social History Narrative   Lives with her husband and their pets. Her children are adults and live independently.   Social Determinants of Health   Financial Resource Strain: Not on file  Food Insecurity: Not on file  Transportation Needs: Not on file  Physical Activity: Not on file  Stress: Not on file  Social Connections: Not on file  Intimate Partner Violence: Not on file      Review of systems: All other review of systems negative except as mentioned in the HPI.   Physical Exam: Vitals:   10/22/20 0947  BP: 124/60  Pulse: 79   Body mass index is 24.51 kg/m. Gen:      No acute distress HEENT:  sclera anicteric Abd:      soft, non-tender; no palpable masses, no distension Ext:    No edema Neuro: alert and oriented x 3 Psych: normal mood and affect  Data Reviewed:  Reviewed labs, radiology imaging, old records and pertinent past GI work up   Assessment and Plan/Recommendations:  60 year old very pleasant female with history of Crohn's disease and CAD with complaints of generalized myalgia, fatigue and viral prodrome symptoms  Elevated transaminases and alk phos could be secondary to acute viral illness Transaminases are trending down.  PT and INR within normal limits Ammonia is mildly elevated, nonspecific could be secondary to dehydration We will continue to monitor LFT, recheck in 2 weeks and again in 4 weeks frontal then normalized  Crohn's disease: Continue Entyvio every 8 weeks for maintenance  Bile salt induced diarrhea: Continue Prevalite 1 packet daily  Vitamin B12 deficiency: Use B12 injection 1000 mcg every 2 weeks X4 followed by 1000 mcg monthly for  maintenance  GERD: Symptoms stable continue daily Protonix and antireflux measures  Return in 2 months for follow-up visit  This visit required 40 minutes of patient care (this includes precharting, chart review, review of results, face-to-face time used for counseling as well as treatment plan and follow-up. The patient was provided an opportunity to ask questions and all were answered. The patient agreed with the plan and demonstrated an understanding of the instructions.  Damaris Hippo , MD    CC: No ref. provider found

## 2020-10-31 ENCOUNTER — Telehealth: Payer: Self-pay | Admitting: Cardiology

## 2020-10-31 MED ORDER — REPATHA SURECLICK 140 MG/ML ~~LOC~~ SOAJ: 1.0000 "pen " | SUBCUTANEOUS | 3 refills | Status: DC

## 2020-10-31 NOTE — Telephone Encounter (Signed)
Refill sent in

## 2020-10-31 NOTE — Telephone Encounter (Signed)
*  STAT* If patient is at the pharmacy, call can be transferred to refill team.   1. Which medications need to be refilled? (please list name of each medication and dose if known) Evolocumab (REPATHA SURECLICK) 401 MG/ML SOAJ  2. Which pharmacy/location (including street and city if local pharmacy) is medication to be sent to? [facility] [place] [address].  3. Do they need a 30 day or 90 day supply? [place]

## 2020-11-03 ENCOUNTER — Other Ambulatory Visit: Payer: Self-pay

## 2020-11-03 NOTE — Telephone Encounter (Signed)
Pt c/o medication issue:  1. Name of Medication:  Evolocumab (REPATHA SURECLICK) 403 MG/ML SOAJ  2. How are you currently taking this medication (dosage and times per day)?  As prescribed  3. Are you having a reaction (difficulty breathing--STAT)?  No   4. What is your medication issue?  Patient states she went to pick up her refill and the pharmacy informed her it will cost over $200 for a 90 day supply of this medication. Unable to afford it, she would like to know if she can take a less expensive alternative. Otherwise, she would like to discuss assistance options. Please return call to discuss.

## 2020-11-03 NOTE — Telephone Encounter (Signed)
Called and spoke w/pt and stated that they need to call We just need a little more information from you. Call [facility] at [phone]) to see if you qualify for the [place]. We tried to complete an application but this message populated

## 2020-11-04 ENCOUNTER — Other Ambulatory Visit: Payer: Self-pay

## 2020-11-04 ENCOUNTER — Encounter: Payer: Self-pay | Admitting: Internal Medicine

## 2020-11-04 ENCOUNTER — Ambulatory Visit: Payer: BC Managed Care – PPO | Admitting: Internal Medicine

## 2020-11-04 VITALS — BP 132/74 | HR 75 | Temp 97.7°F | Resp 18 | Ht 62.0 in | Wt 134.6 lb

## 2020-11-04 DIAGNOSIS — Z794 Long term (current) use of insulin: Secondary | ICD-10-CM | POA: Diagnosis not present

## 2020-11-04 DIAGNOSIS — E1169 Type 2 diabetes mellitus with other specified complication: Secondary | ICD-10-CM

## 2020-11-04 DIAGNOSIS — K50813 Crohn's disease of both small and large intestine with fistula: Secondary | ICD-10-CM

## 2020-11-04 DIAGNOSIS — M791 Myalgia, unspecified site: Secondary | ICD-10-CM

## 2020-11-04 DIAGNOSIS — T466X5A Adverse effect of antihyperlipidemic and antiarteriosclerotic drugs, initial encounter: Secondary | ICD-10-CM

## 2020-11-04 DIAGNOSIS — E118 Type 2 diabetes mellitus with unspecified complications: Secondary | ICD-10-CM

## 2020-11-04 DIAGNOSIS — I700 Atherosclerosis of aorta: Secondary | ICD-10-CM | POA: Insufficient documentation

## 2020-11-04 LAB — CBC
HCT: 37.4 % (ref 36.0–46.0)
Hemoglobin: 12.5 g/dL (ref 12.0–15.0)
MCHC: 33.5 g/dL (ref 30.0–36.0)
MCV: 89.4 fl (ref 78.0–100.0)
Platelets: 391 10*3/uL (ref 150.0–400.0)
RBC: 4.18 Mil/uL (ref 3.87–5.11)
RDW: 14.3 % (ref 11.5–15.5)
WBC: 8.3 10*3/uL (ref 4.0–10.5)

## 2020-11-04 LAB — BASIC METABOLIC PANEL
BUN: 11 mg/dL (ref 6–23)
CO2: 28 mEq/L (ref 19–32)
Calcium: 9.1 mg/dL (ref 8.4–10.5)
Chloride: 102 mEq/L (ref 96–112)
Creatinine, Ser: 0.53 mg/dL (ref 0.40–1.20)
GFR: 99.14 mL/min (ref >60.00)
Glucose, Bld: 185 mg/dL — ABNORMAL HIGH (ref 70–99)
Potassium: 4.1 mEq/L (ref 3.5–5.1)
Sodium: 138 mEq/L (ref 135–145)

## 2020-11-04 LAB — HEMOGLOBIN A1C: Hgb A1c MFr Bld: 8 % — ABNORMAL HIGH (ref 4.6–6.5)

## 2020-11-04 LAB — AMMONIA: Ammonia: 29 umol/L (ref 11–35)

## 2020-11-04 LAB — HEPATIC FUNCTION PANEL
ALT: 22 U/L (ref 0–35)
AST: 21 U/L (ref 0–37)
Albumin: 3.9 g/dL (ref 3.5–5.2)
Alkaline Phosphatase: 121 U/L — ABNORMAL HIGH (ref 39–117)
Bilirubin, Direct: 0.1 mg/dL (ref 0.0–0.3)
Total Bilirubin: 0.4 mg/dL (ref 0.2–1.2)
Total Protein: 6.1 g/dL (ref 6.0–8.3)

## 2020-11-04 LAB — HIGH SENSITIVITY CRP: CRP, High Sensitivity: 2.5 mg/L (ref 0.000–5.000)

## 2020-11-04 NOTE — Patient Instructions (Addendum)
We will check the labs today and let you know about the results.

## 2020-11-04 NOTE — Progress Notes (Signed)
   Subjective:   Patient ID: [name], female    DOB: [birth year], 60 y.o.   MRN: [id number]  HPI The patient is a 60 YO female coming in new with complicated medical history needing ongoing care and with new body aches (seeing GI for crohn's and cardiology for past stents and some chronic fatigue since then, this is different she is not sure why).   repatha 6-12 months ago started is behind a few weeks due to cost  PMH, [facility], social history reviewed and updated  Review of Systems  Constitutional:  Positive for activity change.  HENT: Negative.    Eyes: Negative.   Respiratory:  Negative for cough, chest tightness and shortness of breath.   Cardiovascular:  Negative for chest pain, palpitations and leg swelling.  Gastrointestinal:  Positive for abdominal pain and diarrhea. Negative for abdominal distention, constipation, nausea and vomiting.  Musculoskeletal:  Positive for myalgias.  Skin: Negative.   Neurological: Negative.   Psychiatric/Behavioral: Negative.     Objective:  Physical Exam Constitutional:      Appearance: She is well-developed.  HENT:     Head: Normocephalic and atraumatic.  Cardiovascular:     Rate and Rhythm: Normal rate and regular rhythm.  Pulmonary:     Effort: Pulmonary effort is normal. No respiratory distress.     Breath sounds: Normal breath sounds. No wheezing or rales.  Abdominal:     General: Bowel sounds are normal. There is no distension.     Palpations: Abdomen is soft.     Tenderness: There is abdominal tenderness. There is no rebound.     Comments: Diffuse tenderness  Musculoskeletal:     Cervical back: Normal range of motion.  Skin:    General: Skin is warm and dry.  Neurological:     Mental Status: She is alert and oriented to person, place, and time.     Coordination: Coordination normal.    Vitals:   11/04/20 1259  BP: 132/74  Pulse: 75  Resp: 18  Temp: 97.7 F (36.5 C)  TempSrc: Oral  SpO2: 100%  Weight: 134 lb 9.6 oz  (61.1 kg)  Height: 5' 2"  (1.575 m)    This visit occurred during the SARS-CoV-2 public health emergency.  Safety protocols were in place, including screening questions prior to the visit, additional usage of staff PPE, and extensive cleaning of exam room while observing appropriate contact time as indicated for disinfecting solutions.   Assessment & Plan:

## 2020-11-06 NOTE — Assessment & Plan Note (Signed)
Needs labs as she could be having flare with CRP and CBC and CMP and ammonia as this was elevated recently. Is on entyvio.

## 2020-11-06 NOTE — Assessment & Plan Note (Signed)
Is on repatha and checking lipid panel.

## 2020-11-06 NOTE — Assessment & Plan Note (Signed)
Checking HgA1c and can need adjustment taking lantus 48 units, metformin, actos. Is on ACE-I.

## 2020-11-06 NOTE — Assessment & Plan Note (Deleted)
Checking HgA1c and can need adjustment taking lantus 48 units, metformin, actos. Is on ACE-I.

## 2020-11-06 NOTE — Assessment & Plan Note (Signed)
She has been on repatha about 6-12 months so not likely to be the cause of recent body aches.

## 2020-11-12 NOTE — Progress Notes (Deleted)
Cardiology Office Note:    Date:  11/12/2020   ID:  [name], DOB 11-26-1960, MRN [id number]  PCP:  Hoyt Koch, MD   [place]  Cardiologist:  Freada Bergeron, MD  Advanced Practice Provider:  No care team member to display Electrophysiologist:  None  :[id number]}   Referring MD: No ref. provider found     History of Present Illness:    [name] is a 60 y.o. female with a hx of CAD s/p DES to pLAD in 03/2017, HLD,  palpitations, HTN, DM and Crohn's dz seen for follow up.   Had previously seen Dr. Meda Coffee for chest pain where [facility] 07/2019 was low risk without ischemia or scar.   We last saw her in clinic on 07/14/20 where she was having fluctuating blood pressures as well as intermittent episodes of shortness of breath. Patient was having highs with 180s/110s but no significant lows. Felt fatigued at the time. She followed up with HTN clinic where her home readings were thought to be unreliable due to poor technique. She was continued on enalapril 55may and metop 260mdaily. She now presents for follow-up.  Today, the patient states that she feels overall okay. Has been having symptoms from her fibromyalgia with chronic pain and fatigue. Was unable to get in with rheumatology as not taking new fibromyalgia patients. Also states she been having intermittent episodes of chest tightness that is not exertional in nature. Cannot predict when they are going to occur and each episode lasts a couple of minutes before resolving. Thinks it may be related to reflux and is hoping to undergo EGD with GI.  She has also noted some shortness of breath with exertion that is worse with walking an incline or stairs .Has associated bilateral leg weakness. States this is similar to how she felt prior to having her stent placed in 2018. No palpitations, lightheadedness, syncope. No orthopnea, LE edema, PND or orthopnea. Blood pressure mainly running 130-150s at  home. Has been compliant with all medications since last visit.    Past Medical History:  Diagnosis Date   Allergic rhinitis due to pollen    Allergy    Anxiety state, unspecified    Arthritis    Bronchitis    hx   CAD (coronary artery disease)    s/p NSTEMI in 11/18 treated with a DES to the proximal LAD   Calculus of gallbladder without mention of cholecystitis or obstruction    Crohn disease ([place]   Depressive disorder, not elsewhere classified    Diabetes mellitus without complication ([place]   diet controlled   Esophageal reflux    Fibromyalgia    Grade I diastolic dysfunction    Noted on ECHO   Headache    History of nuclear stress test    ETT-Myoview 10/17: EF 55%, normal perfusion; Low Risk // Nuclear stress test 05/2018:  EF 63, normal perfusion; Low Risk   Myalgia and myositis, unspecified    NSTEMI (non-ST elevated myocardial infarction) ([place]16[place]   Other and unspecified hyperlipidemia    Palpitations    Pneumonia 13   hx   S/P angioplasty with stent 04/18/17 with DES to pLAD 04/19/2017   Seizures ([facility]   1 seizure as a teenager    Unspecified essential hypertension    Vitamin B deficiency     Past Surgical History:  Procedure Laterality Date   BIOPSY  12/05/2018   Procedure: BIOPSY;  Surgeon: NaSilverio Decamp  Venia Minks, MD;  Location: [facility];  Service: Endoscopy;;   CHOLECYSTECTOMY N/A 09/19/2014   Procedure: LAPAROSCOPIC CHOLECYSTECTOMY WITH INTRAOPERATIVE CHOLANGIOGRAM;  Surgeon: Donnie Mesa, MD;  Location: [facility];  Service: General;  Laterality: N/A;   COLONOSCOPY WITH PROPOFOL N/A 12/05/2018   Procedure: COLONOSCOPY WITH PROPOFOL;  Surgeon: Mauri Pole, MD;  Location: [facility];  Service: Endoscopy;  Laterality: N/A;  chromoendoscopy   CORONARY STENT INTERVENTION N/A 04/18/2017   Procedure: CORONARY STENT INTERVENTION;  Surgeon: Martinique, Peter M, MD;  Location: [place] [facility];  Service: Cardiovascular;  Laterality: N/A;    ESOPHAGOGASTRODUODENOSCOPY     EXPLORATORY LAPAROTOMY     for endometriosis   INTRAVASCULAR PRESSURE WIRE/FFR STUDY N/A 04/18/2017   Procedure: INTRAVASCULAR PRESSURE WIRE/FFR STUDY;  Surgeon: Martinique, Peter M, MD;  Location: [place] [facility];  Service: Cardiovascular;  Laterality: N/A;   LEFT HEART CATH AND CORONARY ANGIOGRAPHY N/A 04/18/2017   Procedure: LEFT HEART CATH AND CORONARY ANGIOGRAPHY;  Surgeon: Martinique, Peter M, MD;  Location: [place] [facility];  Service: Cardiovascular;  Laterality: N/A;   POLYPECTOMY  12/05/2018   Procedure: POLYPECTOMY;  Surgeon: Mauri Pole, MD;  Location: [facility];  Service: Endoscopy;;   SUBMUCOSAL LIFTING INJECTION  12/05/2018   Procedure: SUBMUCOSAL LIFTING INJECTION;  Surgeon: Mauri Pole, MD;  Location: [facility];  Service: Endoscopy;;   TONSILLECTOMY AND ADENOIDECTOMY     TUBAL LIGATION     BILATERAL    Current Medications: No outpatient medications have been marked as taking for the 11/24/20 encounter (Appointment) with Freada Bergeron, MD.     Allergies:   Lipitor [atorvastatin], Remicade [infliximab], Ticagrelor, and Rosuvastatin   Social History   Socioeconomic History   Marital status: Married    Spouse name: Dane   Number of children: 2   Years of education: 12+   Highest education level: Not on file  Occupational History   Occupation: ACCOUNT REP.    Employer: [employer]  Tobacco Use   Smoking status: Former    Packs/day: 1.00    Years: 35.00    Pack years: 35.00    Types: Cigarettes    Quit date: 05/31/2004    Years since quitting: 16.4   Smokeless tobacco: Never  Vaping Use   Vaping Use: Never used  Substance and Sexual Activity   Alcohol use: No   Drug use: No   Sexual activity: Not Currently    Comment: married  Other Topics Concern   Not on file  Social History Narrative   Lives with her husband and their pets. Her children are adults and live independently.   Social  Determinants of Health   Financial Resource Strain: Not on file  Food Insecurity: Not on file  Transportation Needs: Not on file  Physical Activity: Not on file  Stress: Not on file  Social Connections: Not on file     Family History: The patient's family history includes Coronary artery disease in her brother; Heart attack in her mother; Heart disease in her brother, mother, and sister; Hypertension in her brother, mother, and sister; Lung cancer in her maternal grandmother; Stroke in her father. There is no history of Colon cancer, Stomach cancer, Esophageal cancer, or Rectal cancer.  ROS:   Please see the history of present illness.    Review of Systems  Constitutional:  Positive for malaise/fatigue. Negative for chills and fever.  HENT:  Negative for hearing loss and sore throat.   Eyes:  Negative  for blurred vision and redness.  Respiratory:  Positive for shortness of breath. Negative for cough.   Cardiovascular:  Positive for chest pain. Negative for palpitations, orthopnea, claudication, leg swelling and PND.  Gastrointestinal:  Positive for abdominal pain and heartburn. Negative for blood in stool, melena, nausea and vomiting.  Genitourinary:  Negative for dysuria and hematuria.  Musculoskeletal:  Positive for back pain, joint pain and myalgias.  Neurological:  Negative for dizziness and loss of consciousness.  Endo/Heme/Allergies:  Negative for polydipsia.  Psychiatric/Behavioral:  Negative for substance abuse.    EKGs/Labs/Other Studies Reviewed:    The following studies were reviewed today: Stress test 07/2019 Normal perfusion No ischemia or scar The left ventricular ejection fraction is normal (55-65%). There was no ST segment deviation noted during stress. This is a low risk study.   Echo 03/2017 Left ventricle: The cavity size was normal. Wall thickness was    normal. Systolic function was normal. The estimated ejection    fraction was in the range of 55% to 60%.  Mild hypokinesis of the    apicalanteroseptal myocardium. Doppler parameters are consistent    with abnormal left ventricular relaxation (grade 1 diastolic    dysfunction).  - Pulmonary arteries: Systolic pressure was mildly increased. PA    peak pressure: 35 mm Hg (S).    CORONARY STENT INTERVENTION   03/2017  INTRAVASCULAR PRESSURE WIRE/FFR STUDY  LEFT HEART CATH AND CORONARY ANGIOGRAPHY      Conclusion     Prox LAD lesion is 65% stenosed. A drug-eluting stent was successfully placed using a STENT PROMUS PREM MR 3.0X12. Post intervention, there is a 0% residual stenosis. The left ventricular systolic function is normal. LV end diastolic pressure is normal. The left ventricular ejection fraction is 50-55% by visual estimate.   1. Single vessel obstructive CAD. There is focal and somewhat hazy lesion in the proximal LAD. iFr was 0.88 with FFR of .82. This is of borderline significance but given her clinical scenario with typical chest pain, Ecg changes, and T wave abnormality along with anterior wall motion abnormality we decided to treat this lesion.  2. Overall good LV function 3. Normal LVEDP 4. Successful stenting of the proximal LAD with DES   Plan: DAPT for one year. Anticipate DC tomorrow.     EKG:  EKG is  ordered today.  The ekg ordered today demonstrates NSR with HR 80  Recent Labs: 11/04/2020: ALT 22; BUN 11; Creatinine, Ser 0.53; Hemoglobin 12.5; Platelets 391.0; Potassium 4.1; Sodium 138  Recent Lipid Panel    Component Value Date/Time   CHOL 105 07/14/2020 1007   TRIG 469 (H) 07/14/2020 1007   HDL 37 (L) 07/14/2020 1007   CHOLHDL 2.8 07/14/2020 1007   CHOLHDL 7 12/18/2015 1003   VLDL 53.6 (H) 08/15/2014 0734   LDLCALC 7 07/14/2020 1007   LDLDIRECT 23 07/14/2020 1007   LDLDIRECT 85.0 12/18/2015 1003     Risk Assessment/Calculations:       Physical Exam:    VS:  LMP  (LMP Unknown)     Wt Readings from Last 3 Encounters:  11/04/20 134 lb 9.6 oz  (61.1 kg)  10/22/20 134 lb (60.8 kg)  10/18/20 132 lb (59.9 kg)     GEN:  Well nourished, well developed in no acute distress HEENT: Normal NECK: No JVD; No carotid bruits CARDIAC: RRR, no murmurs, rubs, gallops RESPIRATORY:  Clear to auscultation without rales, wheezing or rhonchi  ABDOMEN: Soft, non-tender, non-distended MUSCULOSKELETAL:  No edema; No  deformity  SKIN: Warm and dry NEUROLOGIC:  Alert and oriented x 3 PSYCHIATRIC:  Normal affect   ASSESSMENT:    No diagnosis found.  PLAN:    In order of problems listed above:  #Known CAD s/p pLAD stent in 2018: #SOB and chest tightness Patient with worsening DOE that have been ongoing for past several weeks. States that symptoms are similar to that which she experienced with her stent in 2018. Has episodes of chest tightness as well that are not related to exertion. Myoview in 2021 negative for ischemia or infarction, but she states that symptoms are worse now. Will repeat myoview to assess for ischemia. -Check Myoview--if low/intermediate risk, can undergo EGD without further work-up -Continue ASA 60m daily -Increase enalapril to 141mdaily -Continue metop 2523mL -Management of HLD as below -Management of suspected GERD per GI; will obtain myoview prior to EGD given worsening DOE as detailed above  #HTN: Remains elevated in 130-150s on home cuff. -Increase enalapril to 71m54mily -Continue metop 25mg79mdaily  -Keep blood pressure log -BMET next week   #HLD: #Hypertriglyceridemia Intolerant of statins. LDL controlled at 7. TG 469 (was not taking her fenofibrate or vascepa) -Continue repatha  -Continue fenofibrate 145mg 38my -Continue vascepa 2g BID -Patient states that she has been compliant with medications since last visit  #Generalized Malaise/Fatigue #Joint Pain: Patient concerned about worsening fatigue, total body aches and joint pain. Attempted to refer to rheumatology but not taking new fibromyalgia  patients. Has had multiple sleep studies which were negative for significant OSA. -Management per primary care   #DMII: Having frequent episodes of high blood glucose due to dietary indiscretion. Currently on insulin at night. A1C 8.2 -Referred to endocrinology for further management of DMII -Emphasized importance of dietary compliance     Shared Decision Making/Informed Consent The risks [chest pain, shortness of breath, cardiac arrhythmias, dizziness, blood pressure fluctuations, myocardial infarction, stroke/transient ischemic attack, nausea, vomiting, allergic reaction, radiation exposure, metallic taste sensation and life-threatening complications (estimated to be 1 in 10,000)], benefits (risk stratification, diagnosing coronary artery disease, treatment guidance) and alternatives of a nuclear stress test were discussed in detail with Ms. Dombrosky and she agrees to proceed.   Medication Adjustments/Labs and Tests Ordered: Current medicines are reviewed at length with the patient today.  Concerns regarding medicines are outlined above.  No orders of the defined types were placed in this encounter.  No orders of the defined types were placed in this encounter.   There are no Patient Instructions on file for this visit.    Signed, HeatheFreada Bergeron6/15/2022 2:56 PM    [facility]

## 2020-11-24 ENCOUNTER — Ambulatory Visit: Payer: BC Managed Care – PPO | Admitting: Cardiology

## 2020-11-27 DIAGNOSIS — N390 Urinary tract infection, site not specified: Secondary | ICD-10-CM | POA: Diagnosis not present

## 2020-12-04 DIAGNOSIS — R030 Elevated blood-pressure reading, without diagnosis of hypertension: Secondary | ICD-10-CM | POA: Diagnosis not present

## 2020-12-04 DIAGNOSIS — R0602 Shortness of breath: Secondary | ICD-10-CM | POA: Diagnosis not present

## 2020-12-04 DIAGNOSIS — R5383 Other fatigue: Secondary | ICD-10-CM | POA: Diagnosis not present

## 2020-12-04 DIAGNOSIS — E119 Type 2 diabetes mellitus without complications: Secondary | ICD-10-CM | POA: Diagnosis not present

## 2020-12-04 DIAGNOSIS — Z20828 Contact with and (suspected) exposure to other viral communicable diseases: Secondary | ICD-10-CM | POA: Diagnosis not present

## 2020-12-04 DIAGNOSIS — N3091 Cystitis, unspecified with hematuria: Secondary | ICD-10-CM | POA: Diagnosis not present

## 2020-12-05 ENCOUNTER — Telehealth: Payer: Self-pay | Admitting: Gastroenterology

## 2020-12-05 ENCOUNTER — Other Ambulatory Visit: Payer: Self-pay

## 2020-12-05 ENCOUNTER — Other Ambulatory Visit: Payer: BC Managed Care – PPO

## 2020-12-05 DIAGNOSIS — R197 Diarrhea, unspecified: Secondary | ICD-10-CM

## 2020-12-05 DIAGNOSIS — A048 Other specified bacterial intestinal infections: Secondary | ICD-10-CM | POA: Diagnosis not present

## 2020-12-05 NOTE — Telephone Encounter (Signed)
Pt is still experiencing diarrhea. She stated that it comes and goes. She would like some advise.

## 2020-12-05 NOTE — Telephone Encounter (Signed)
Probably most important would be for her to get her Entyvio infusion.  Hopefully she can get that very soon.  Offer help to schedule that if she needs.  Lets also get GI pathogen panel since she has been on antibiotics recently.

## 2020-12-05 NOTE — Telephone Encounter (Signed)
DOD Patient of Dr Woodward Ku. She has Crohn's and is on Entyvio. She is past due her infusion x 2 weeks. Cancellations due to her schedule and then because she did feel well. Symptoms she is calling about are frequent loose bowel movements at least 6 times a day. Her lower abdomen feels bloated and is a little tender if she pushes on it. She is afebrile. She states she has not felt well for a while now. She went to an urgent care 2 days ago. She received an antibiotic injection she thinks was Rocephin. She was told her CBC was normal. States her blood sugars have been a little more elevated than her usual.  She is scheduled for follow up with Dr Silverio Decamp on 12/16/20. Would you please review and advise? Thanks

## 2020-12-05 NOTE — Telephone Encounter (Signed)
Patient is agreeable to this plan. Trying for infusion on 12/08/20. Agrees to labs. Order entered.

## 2020-12-08 DIAGNOSIS — K50813 Crohn's disease of both small and large intestine with fistula: Secondary | ICD-10-CM | POA: Diagnosis not present

## 2020-12-08 LAB — GI PROFILE, STOOL, PCR

## 2020-12-15 ENCOUNTER — Telehealth: Payer: Self-pay

## 2020-12-15 DIAGNOSIS — I25119 Atherosclerotic heart disease of native coronary artery with unspecified angina pectoris: Secondary | ICD-10-CM

## 2020-12-15 MED ORDER — METFORMIN HCL ER 500 MG PO TB24: 500.0000 mg | ORAL_TABLET | Freq: Every day | ORAL | 1 refills | Status: DC

## 2020-12-15 MED ORDER — PIOGLITAZONE HCL 30 MG PO TABS: 30.0000 mg | ORAL_TABLET | Freq: Every day | ORAL | 1 refills | Status: DC

## 2020-12-15 MED ORDER — PANTOPRAZOLE SODIUM 40 MG PO TBEC: 40.0000 mg | DELAYED_RELEASE_TABLET | Freq: Every day | ORAL | 0 refills | Status: DC

## 2020-12-15 MED ORDER — LEVOCETIRIZINE DIHYDROCHLORIDE 5 MG PO TABS: 5.0000 mg | ORAL_TABLET | Freq: Every evening | ORAL | 0 refills | Status: DC

## 2020-12-15 MED ORDER — ENALAPRIL MALEATE 10 MG PO TABS: 10.0000 mg | ORAL_TABLET | Freq: Every day | ORAL | 1 refills | Status: DC

## 2020-12-15 MED ORDER — MONTELUKAST SODIUM 10 MG PO TABS
10.0000 mg | ORAL_TABLET | Freq: Every day | ORAL | 0 refills | Status: DC
Start: 2020-12-15 — End: 2021-01-19

## 2020-12-15 MED ORDER — INSULIN GLARGINE 100 UNIT/ML ~~LOC~~ SOLN: 48.0000 [IU] | Freq: Every day | SUBCUTANEOUS | 0 refills | Status: DC

## 2020-12-15 NOTE — Telephone Encounter (Signed)
Pt called requesting rx refills from Dr. Sharlet Salina as she is now under Dr. Charlynne Cousins care and her old PCP was listed on her meds. Message sent to MA.

## 2020-12-15 NOTE — Telephone Encounter (Signed)
LVM instructing pt to call back to scheduled 28moappt (for Sept) as requested per Dr CSharlet Salinaafter visit on 11/04/20.

## 2020-12-16 ENCOUNTER — Other Ambulatory Visit (INDEPENDENT_AMBULATORY_CARE_PROVIDER_SITE_OTHER): Payer: BC Managed Care – PPO

## 2020-12-16 ENCOUNTER — Ambulatory Visit (INDEPENDENT_AMBULATORY_CARE_PROVIDER_SITE_OTHER): Payer: BC Managed Care – PPO | Admitting: Gastroenterology

## 2020-12-16 ENCOUNTER — Encounter: Payer: Self-pay | Admitting: Gastroenterology

## 2020-12-16 VITALS — BP 130/74 | HR 76 | Ht 62.0 in | Wt 137.4 lb

## 2020-12-16 DIAGNOSIS — K5080 Crohn's disease of both small and large intestine without complications: Secondary | ICD-10-CM

## 2020-12-16 DIAGNOSIS — E538 Deficiency of other specified B group vitamins: Secondary | ICD-10-CM | POA: Diagnosis not present

## 2020-12-16 DIAGNOSIS — R945 Abnormal results of liver function studies: Secondary | ICD-10-CM

## 2020-12-16 DIAGNOSIS — M791 Myalgia, unspecified site: Secondary | ICD-10-CM

## 2020-12-16 DIAGNOSIS — R5383 Other fatigue: Secondary | ICD-10-CM | POA: Diagnosis not present

## 2020-12-16 DIAGNOSIS — K5090 Crohn's disease, unspecified, without complications: Secondary | ICD-10-CM | POA: Diagnosis not present

## 2020-12-16 DIAGNOSIS — R7989 Other specified abnormal findings of blood chemistry: Secondary | ICD-10-CM

## 2020-12-16 LAB — HEPATIC FUNCTION PANEL
ALT: 53 U/L — ABNORMAL HIGH (ref 0–35)
AST: 45 U/L — ABNORMAL HIGH (ref 0–37)
Albumin: 4 g/dL (ref 3.5–5.2)
Alkaline Phosphatase: 108 U/L (ref 39–117)
Bilirubin, Direct: 0 mg/dL (ref 0.0–0.3)
Total Bilirubin: 0.4 mg/dL (ref 0.2–1.2)
Total Protein: 6.2 g/dL (ref 6.0–8.3)

## 2020-12-16 LAB — HIGH SENSITIVITY CRP: CRP, High Sensitivity: 8.86 mg/L — ABNORMAL HIGH (ref 0.000–5.000)

## 2020-12-16 MED ORDER — PANTOPRAZOLE SODIUM 40 MG PO TBEC: 40.0000 mg | DELAYED_RELEASE_TABLET | Freq: Every day | ORAL | 3 refills | Status: DC

## 2020-12-16 MED ORDER — CHOLESTYRAMINE LIGHT 4 G PO PACK: 4.0000 g | PACK | Freq: Two times a day (BID) | ORAL | 3 refills | Status: DC

## 2020-12-16 MED ORDER — CYANOCOBALAMIN 1000 MCG/ML IJ SOLN: INTRAMUSCULAR | 12 refills | Status: DC

## 2020-12-16 NOTE — Patient Instructions (Addendum)
Your provider has requested that you go to the basement level for lab work before leaving today. Press "B" on the elevator. The lab is located at the first door on the left as you exit the elevator.   You will need to take vitamin B12 injections every 2 weeks for 3 months then afterwards you will only need 1 B12 injection once monthly  We will send B12 and Prevalite to [facility] pharmacy    If you are age 62 or older, your body mass index should be between 23-30. Your Body mass index is 25.13 kg/m. If this is out of the aforementioned range listed, please consider follow up with your Primary Care Provider.  If you are age 78 or younger, your body mass index should be between 19-25. Your Body mass index is 25.13 kg/m. If this is out of the aformentioned range listed, please consider follow up with your Primary Care Provider.   __________________________________________________________  The [place] [facility] providers would like to encourage you to use [facility] to communicate with providers for non-urgent requests or questions.  Due to long hold times on the telephone, sending your provider a message by [facility] may be a faster and more efficient way to get a response.  Please allow 48 business hours for a response.  Please remember that this is for non-urgent requests.    Due to recent changes in healthcare laws, you may see the results of your imaging and laboratory studies on MyChart before your provider has had a chance to review them.  We understand that in some cases there may be results that are confusing or concerning to you. Not all laboratory results come back in the same time frame and the provider may be waiting for multiple results in order to interpret others.  Please give [place] 48 hours in order for your provider to thoroughly review all the results before contacting the office for clarification of your results.    I appreciate the  opportunity to care for you  Thank You   Harl Bowie , MD

## 2020-12-16 NOTE — Chronic Care Management (AMB) (Signed)
Per Pharmacy, lantus is not covered, I believe that per formularly tresiba would be one of the preferred agents (others appear to include levemir, toujeo, and semglee)  If you are able to send over a prescription for tresiba (100 unit/mL) 48 units daily I believe this would solve the issue   Thanks Linna Hoff

## 2020-12-16 NOTE — Progress Notes (Signed)
[name]    [id number]    [birth year]  Primary Care Physician:Crawford, Real Cons, MD  Referring Physician: No referring provider defined for this encounter.   Chief complaint:  Crohn's disease  HPI:  60 year old very pleasant female with history of Crohn's disease on Entyvio for maintenance therapy here for follow-up visit for crohn's disease   LFT are trending down to baseline   Diarrhea is slightly better with Prevalite once daily but is continuing to have 2-3 soft semi formed bowel movement.  She has intermittent RLQ abd pain, no association with diet or activity She is doing well with entyvio infusion, no relationship with timing of infusion   Colonoscopy December 05, 2018: Terminal ileal biopsies consistent with Crohn's disease.  IC valve polypoid lesion status post EMR, biopsies consistent with IBD negative for dysplasia or adenomatous tissue 5 mm polyp, tubular adenoma removed from transverse colon otherwise normal colon on chromoendoscopy   Colonoscopy July 07, 2018 Nodular TI biopsied Localized nodular polypoid mucosa in IC valve biopsied, ?  Adenoma on pathology report, requested additional review is pending.   EGD 08/22/2009: Esophagitis otherwise unremarkable exam      MRI pelvis February 14, 2018 showed grade 1 simple linear intersphincteric perianal fistula without associated abscess.  Mild segmental wall thickening and luminal narrowing in the distal ileum suggestive of mild active Crohn's ileitis.  Mild chronic wall thickening in the terminal ileum without active inflammatory changes.   Relevant GI history: Crohn's disease with predominant small bowel involvement diagnosed in 1991.  Initially was managed with prednisone, had significant side effects.  Subsequently was in clinical remission on 6-MP.  6-MP was discontinued in 2017 due to persistently elevated transaminases, have improved after discontinuing 6-MP.  She was started on Humira,  clinically failed with persistent symptoms.  Had undetectable drug trough with elevated antibodies.  Started on budesonide 9 mg daily and taper down to 3 mg daily in 2018.  Developed rectal pain and noted to have perianal fistula on MRI pelvis September 2019.  Relevant other PMH She had NSTEMI in November 2018, status post coronary stent placement (DES)  Outpatient Encounter Medications as of 12/16/2020  Medication Sig   acetaminophen (TYLENOL) 650 MG CR tablet Take 650 mg by mouth every 8 (eight) hours as needed for pain.   albuterol (PROVENTIL) (2.5 MG/3ML) 0.083% nebulizer solution Take 2.5 mg by nebulization every 4 (four) hours as needed for wheezing or shortness of breath.    aspirin EC 81 MG tablet Take 81 mg by mouth daily. Swallow whole.   busPIRone (BUSPAR) 10 MG tablet Take 10 mg by mouth 2 (two) times daily.   colestipol (COLESTID) 1 g tablet TAKE 1 TABLET (1 G TOTAL) BY MOUTH TWO (TWO) TIMES DAILY. AS NEEDED (Patient taking differently: Take 1 g by mouth daily.)   DULoxetine (CYMBALTA) 60 MG capsule Take 60 mg by mouth daily.   enalapril (VASOTEC) 10 MG tablet Take 1 tablet (10 mg total) by mouth daily.   Evolocumab (REPATHA SURECLICK) 759 MG/ML SOAJ Inject 1 pen into the skin every 14 (fourteen) days.   famotidine (PEPCID) 20 MG tablet Take 1 tablet (20 mg total) by mouth 2 (two) times daily.   fenofibrate (TRICOR) 145 MG tablet TAKE ONE TABLET BY MOUTH ONCE DAILY (Patient taking differently: Take 145 mg by mouth daily.)   furosemide (LASIX) 40 MG tablet Take 1 tablet (40 mg total) by mouth daily as needed for fluid  or edema.   insulin glargine (LANTUS) 100 UNIT/ML injection Inject 0.48 mLs (48 Units total) into the skin at bedtime.   levocetirizine (XYZAL) 5 MG tablet Take 1 tablet (5 mg total) by mouth every evening.   metFORMIN (GLUCOPHAGE-XR) 500 MG 24 hr tablet Take 1 tablet (500 mg total) by mouth daily with breakfast.   metoprolol succinate (TOPROL XL) 25 MG 24 hr tablet Take  1 tablet (25 mg total) by mouth daily.   montelukast (SINGULAIR) 10 MG tablet Take 1 tablet (10 mg total) by mouth at bedtime.   Multiple Vitamins-Minerals (MULTIVITAMIN WITH MINERALS) tablet Take 1 tablet by mouth daily.   nitroGLYCERIN (NITROSTAT) 0.4 MG SL tablet Place 0.4 mg under the tongue every 5 (five) minutes as needed for chest pain.   ondansetron (ZOFRAN ODT) 4 MG disintegrating tablet Take 1 tablet (4 mg total) by mouth every 4 (four) hours as needed for nausea or vomiting.   pantoprazole (PROTONIX) 40 MG tablet Take 1 tablet (40 mg total) by mouth daily.   pioglitazone (ACTOS) 30 MG tablet Take 1 tablet (30 mg total) by mouth daily.   Probiotic Product (PROBIOTIC DAILY PO) Take 1 capsule by mouth daily.   sodium chloride (OCEAN) 0.65 % SOLN nasal spray Place 1 spray into both nostrils daily as needed for congestion.   VASCEPA 1 g capsule Take 2 capsules (2 g total) by mouth 2 (two) times daily.   vedolizumab (ENTYVIO) 300 MG injection Inject 300 mg into the vein every 8 (eight) weeks. After the standard induction   Vitamin D3 (VITAMIN D) 25 MCG tablet Take 1,000 Units by mouth at bedtime.   [DISCONTINUED] EPIPEN 2-PAK 0.3 MG/0.3ML SOAJ injection Inject 0.3 mg into the muscle as needed for anaphylaxis. Reported on 06/27/2015   No facility-administered encounter medications on file as of 12/16/2020.    Allergies as of 12/16/2020 - Review Complete 12/16/2020  Allergen Reaction Noted   Lipitor [atorvastatin] Other (See Comments) 03/28/2013   Remicade [infliximab]     Ticagrelor  09/16/2017   Rosuvastatin Other (See Comments) 01/25/2019    Past Medical History:  Diagnosis Date   Allergic rhinitis due to pollen    Allergy    Anxiety state, unspecified    Arthritis    Bronchitis    hx   CAD (coronary artery disease)    s/p NSTEMI in 11/18 treated with a DES to the proximal LAD   Calculus of gallbladder without mention of cholecystitis or obstruction    Crohn disease ([place])     Depressive disorder, not elsewhere classified    Diabetes mellitus without complication ([place])    diet controlled   Esophageal reflux    Fibromyalgia    Grade I diastolic dysfunction    Noted on ECHO   Headache    History of nuclear stress test    ETT-Myoview 10/17: EF 55%, normal perfusion; Low Risk // Nuclear stress test 05/2018:  EF 63, normal perfusion; Low Risk   Myalgia and myositis, unspecified    NSTEMI (non-ST elevated myocardial infarction) ([place]) 04/15/2017   Other and unspecified hyperlipidemia    Palpitations    Pneumonia 15   hx   S/P angioplasty with stent 04/18/17 with DES to pLAD 04/19/2017   Seizures ([place])    1 seizure as a teenager    Unspecified essential hypertension    Vitamin B deficiency     Past Surgical History:  Procedure Laterality Date   BIOPSY  12/05/2018   Procedure: BIOPSY;  Surgeon:  Mauri Pole, MD;  Location: [facility];  Service: Endoscopy;;   CHOLECYSTECTOMY N/A 09/19/2014   Procedure: LAPAROSCOPIC CHOLECYSTECTOMY WITH INTRAOPERATIVE CHOLANGIOGRAM;  Surgeon: Donnie Mesa, MD;  Location: [facility];  Service: General;  Laterality: N/A;   COLONOSCOPY WITH PROPOFOL N/A 12/05/2018   Procedure: COLONOSCOPY WITH PROPOFOL;  Surgeon: Mauri Pole, MD;  Location: [facility];  Service: Endoscopy;  Laterality: N/A;  chromoendoscopy   CORONARY STENT INTERVENTION N/A 04/18/2017   Procedure: CORONARY STENT INTERVENTION;  Surgeon: Martinique, Peter M, MD;  Location: [facility];  Service: Cardiovascular;  Laterality: N/A;   ESOPHAGOGASTRODUODENOSCOPY     EXPLORATORY LAPAROTOMY     for endometriosis   INTRAVASCULAR PRESSURE WIRE/FFR STUDY N/A 04/18/2017   Procedure: INTRAVASCULAR PRESSURE WIRE/FFR STUDY;  Surgeon: Martinique, Peter M, MD;  Location: [facility];  Service: Cardiovascular;  Laterality: N/A;   LEFT HEART CATH AND CORONARY ANGIOGRAPHY N/A 04/18/2017   Procedure: LEFT HEART CATH AND CORONARY ANGIOGRAPHY;  Surgeon: Martinique, Peter M,  MD;  Location: [facility];  Service: Cardiovascular;  Laterality: N/A;   POLYPECTOMY  12/05/2018   Procedure: POLYPECTOMY;  Surgeon: Mauri Pole, MD;  [facility];  Service: Endoscopy;;   SUBMUCOSAL LIFTING INJECTION  12/05/2018   Procedure: SUBMUCOSAL LIFTING INJECTION;  Surgeon: Mauri Pole, MD;  [facility];  Service: Endoscopy;;   TONSILLECTOMY AND ADENOIDECTOMY     TUBAL LIGATION     BILATERAL    Family History  Problem Relation Age of Onset   Lung cancer Maternal Grandmother    Stroke Father    Hypertension Mother    Heart attack Mother    Heart disease Mother    Hypertension Brother    Heart disease Brother    Hypertension Sister    Heart disease Sister    Coronary artery disease Brother    Colon cancer Neg Hx    Stomach cancer Neg Hx    Esophageal cancer Neg Hx    Rectal cancer Neg Hx     Social History   Socioeconomic History   Marital status: Married    Spouse name: Dane   Number of children: 2   Years of education: 12+   Highest education level: Not on file  Occupational History   Occupation: ACCOUNT REP.    Employer: [employer]  Tobacco Use   Smoking status: Former    Packs/day: 1.00    Years: 35.00    Pack years: 35.00    Types: Cigarettes    Quit date: 05/31/2004    Years since quitting: 16.5   Smokeless tobacco: Never  Vaping Use   Vaping Use: Never used  Substance and Sexual Activity   Alcohol use: No   Drug use: No   Sexual activity: Not Currently    Comment: married  Other Topics Concern   Not on file  Social History Narrative   Lives with her husband and their pets. Her children are adults and live independently.   Social Determinants of Health   Financial Resource Strain: Not on file  Food Insecurity: Not on file  Transportation Needs: Not on file  Physical Activity: Not on file  Stress: Not on file  Social Connections: Not on file  Intimate Partner Violence: Not on file       Review of systems: All other review of systems negative except as mentioned in the HPI.   Physical Exam: Vitals:   12/16/20 1349  BP: 130/74  Pulse: 76  Body mass index is 25.13 kg/m. Gen:      No acute distress HEENT:  sclera anicteric Abd:      soft, non-tender; no palpable masses, no distension Ext:    No edema Neuro: alert and oriented x 3 Psych: normal mood and affect  Data Reviewed:  Reviewed labs, radiology imaging, old records and pertinent past GI work up   Assessment and Plan/Recommendations:  60 year old very pleasant female with history of Crohn's disease and CAD here for follow up with c/o persistent increased bowel frequency and diarrhea  Diarrhea likely bile salt induced GI pathogen panel negative for acute GI infection Increase Prevalite to 1 packet twice daily, advised patient to avoid taking it within 2 to 3 hours of her other medication to prevent drug interaction If continues to have persistent diarrhea, will consider repeat colonoscopy with biopsies to exclude active inflammation with Crohn's colitis We will also plan to check Entyvio drug trough and antibody level at follow-up visit if she continues to have persistent diarrhea   Crohn's disease: Continue Entyvio every 8 weeks for maintenance   Abnormal LFT with elevated transaminases likely secondary to acute viral syndrome, LFTs have trended down to baseline.  We will repeat LFT today   Vitamin B12 deficiency: Use B12 injection 1000 mcg every 2 weeks X6 for 3 months followed by 1000 mcg monthly for maintenance    GERD: Symptoms stable continue daily Protonix and antireflux measures   Return in 3 months for follow-up visit or sooner if needed  This visit required >40 minutes of patient care (this includes precharting, chart review, review of results, face-to-face time used for counseling as well as treatment plan and follow-up. The patient was provided an opportunity to ask questions and  all were answered. The patient agreed with the plan and demonstrated an understanding of the instructions.  Damaris Hippo , MD    CC: No ref. provider found

## 2020-12-17 NOTE — Progress Notes (Deleted)
Cardiology Office Note:    Date:  12/17/2020   ID:  [name], DOB [birth year], MRN [id number]  PCP:  Hoyt Koch, MD   [place]  Cardiologist:  Freada Bergeron, MD  Advanced Practice Provider:  No care team member to display Electrophysiologist:  None  :[id number]}   Referring MD: Hoyt Koch, *     History of Present Illness:    [name] is a 60 y.o. female with a hx of CAD s/p DES to pLAD in 03/2017, HLD,  palpitations, HTN, DM and Crohn's disease who was previously followed by Dr. Meda Coffee who now returns to clinic for follow-up.  Had previously seen Dr. Meda Coffee for chest pain where [facility] 07/2019 was low risk without ischemia or scar.   Was seen in clinic on 07/14/20 where she was having fluctuating BP. She followed up with HTN clinic where her home readings were thought to be unreliable due to poor technique. She was continued on enalapril 49may and metop 269mdaily.   Seen in follow-up on 08/11/20 where she was having intermittent episodes of chest tightness and dyspnea on exertion. Myoview 07/2020 showed normal LVEF with no ischemia or infarction.     Past Medical History:  Diagnosis Date   Allergic rhinitis due to pollen    Allergy    Anxiety state, unspecified    Arthritis    Bronchitis    hx   CAD (coronary artery disease)    s/p NSTEMI in 11/18 treated with a DES to the proximal LAD   Calculus of gallbladder without mention of cholecystitis or obstruction    [place] disease [place]   Depressive disorder, not elsewhere classified    Diabetes [place] complication ([place]   diet controlled   Esophageal reflux    Fibromyalgia    Grade I diastolic dysfunction    Noted on ECHO   Headache    History of nuclear stress test    ETT-Myoview 10/17: EF 55%, normal perfusion; Low Risk // Nuclear stress test 05/2018:  EF 63, normal perfusion; Low Risk   Myalgia and myositis, unspecified    [place] (non-ST elevated  [place]   Other and unspecified hyperlipidemia    Palpitations    Pneumonia 15   hx   S/P angioplasty with stent 04/18/17 with DES to pLAD 04/19/2017   [place]   1 seizure as a teenager    Unspecified essential hypertension    Vitamin B deficiency     Past Surgical History:  Procedure Laterality Date   BIOPSY  12/05/2018   Procedure: BIOPSY;  Surgeon: NaMauri PoleMD;  [facility];  Service: Endoscopy;;   CHOLECYSTECTOMY N/A 09/19/2014   Procedure: LAPAROSCOPIC CHOLECYSTECTOMY WITH INTRAOPERATIVE CHOLANGIOGRAM;  Surgeon: MaDonnie MesaMD;  [facility];  Laterality: N/A;   COLONOSCOPY WITH PROPOFOL N/A 12/05/2018   Procedure: COLONOSCOPY WITH PROPOFOL;  Surgeon: NaMauri PoleMD;  [facility];  Service: Endoscopy;  Laterality: N/A;  chromoendoscopy   CORONARY STENT INTERVENTION N/A 04/18/2017   Procedure: CORONARY STENT INTERVENTION;  Surgeon: JoMartiniquePeter M, MD;  [facility];  Service: Cardiovascular;  Laterality: N/A;   ESOPHAGOGASTRODUODENOSCOPY     EXPLORATORY LAPAROTOMY     for endometriosis   INTRAVASCULAR PRESSURE WIRE/FFR STUDY N/A 04/18/2017   Procedure: INTRAVASCULAR PRESSURE WIRE/FFR STUDY;  Surgeon: JoMartiniquePeter M, MD;  [facility];  Service:  Cardiovascular;  Laterality: N/A;   LEFT HEART CATH AND CORONARY ANGIOGRAPHY N/A 04/18/2017   Procedure: LEFT HEART CATH AND CORONARY ANGIOGRAPHY;  Surgeon: Martinique, Peter M, MD;  Location: [facility];  Service: Cardiovascular;  Laterality: N/A;   POLYPECTOMY  12/05/2018   Procedure: POLYPECTOMY;  Surgeon: Mauri Pole, MD;  [facility];  Service: Endoscopy;;   SUBMUCOSAL LIFTING INJECTION  12/05/2018   Procedure: SUBMUCOSAL LIFTING INJECTION;  Surgeon: Mauri Pole, MD;  [facility];  Service: Endoscopy;;   TONSILLECTOMY AND ADENOIDECTOMY     TUBAL LIGATION      BILATERAL    Current Medications: No outpatient medications have been marked as taking for the 12/19/20 encounter (Appointment) with Freada Bergeron, MD.     Allergies:   Lipitor [atorvastatin], Remicade [infliximab], Ticagrelor, and Rosuvastatin   Social History   Socioeconomic History   Marital status: Married    Spouse name: [name]   Number of children: 2   Years of education: 12+   Highest education level: Not on file  Occupational History   Occupation: ACCOUNT REP.    Employer: [employer]  Tobacco Use   Smoking status: Former    Packs/day: 1.00    Years: 35.00    Pack years: 35.00    Types: Cigarettes    Quit date: 05/31/2004    Years since quitting: 16.5   Smokeless tobacco: Never  Vaping Use   Vaping Use: Never used  Substance and Sexual Activity   Alcohol use: No   Drug use: No   Sexual activity: Not Currently    Comment: married  Other Topics Concern   Not on file  Social History Narrative   Lives with her husband and their pets. Her children are adults and live independently.   Social Determinants of Health   Financial Resource Strain: Not on file  Food Insecurity: Not on file  Transportation Needs: Not on file  Physical Activity: Not on file  Stress: Not on file  Social Connections: Not on file     Family History: The patient's family history includes Coronary artery disease in her brother; Heart attack in her mother; Heart disease in her brother, mother, and sister; Hypertension in her brother, mother, and sister; Lung cancer in her maternal grandmother; Stroke in her father. There is no history of Colon cancer, Stomach cancer, Esophageal cancer, or Rectal cancer.  ROS:   Please see the history of present illness.    Review of Systems  Constitutional:  Positive for malaise/fatigue. Negative for chills and fever.  HENT:  Negative for hearing loss and sore throat.   Eyes:  Negative for blurred vision and redness.  Respiratory:   Positive for shortness of breath. Negative for cough.   Cardiovascular:  Positive for chest pain. Negative for palpitations, orthopnea, claudication, leg swelling and PND.  Gastrointestinal:  Positive for abdominal pain and heartburn. Negative for blood in stool, melena, nausea and vomiting.  Genitourinary:  Negative for dysuria and hematuria.  Musculoskeletal:  Positive for back pain, joint pain and myalgias.  Neurological:  Negative for dizziness and loss of consciousness.  Endo/Heme/Allergies:  Negative for polydipsia.  Psychiatric/Behavioral:  Negative for substance abuse.    EKGs/Labs/Other Studies Reviewed:    The following studies were reviewed today: Myoview 07/2020: The left ventricular ejection fraction is normal (55-65%). Nuclear stress EF: 65%. There was no ST segment deviation noted during stress. The study is normal.   No evidence for ischemia or infarction.  Normal wall motion. Stress test 07/2019 Normal perfusion No ischemia or scar The left ventricular ejection fraction is normal (55-65%). There was no ST segment deviation noted during stress. This is a low risk study.   Echo 03/2017 Left ventricle: The cavity size was normal. Wall thickness was    normal. Systolic function was normal. The estimated ejection    fraction was in the range of 55% to 60%. Mild hypokinesis of the    apicalanteroseptal myocardium. Doppler parameters are consistent    with abnormal left ventricular relaxation (grade 1 diastolic    dysfunction).  - Pulmonary arteries: Systolic pressure was mildly increased. PA    peak pressure: 35 mm Hg (S).    CORONARY STENT INTERVENTION   03/2017  INTRAVASCULAR PRESSURE WIRE/FFR STUDY  LEFT HEART CATH AND CORONARY ANGIOGRAPHY      Conclusion     Prox LAD lesion is 65% stenosed. A drug-eluting stent was successfully placed using a STENT PROMUS PREM MR 3.0X12. Post intervention, there is a 0% residual stenosis. The left ventricular systolic  function is normal. LV end diastolic pressure is normal. The left ventricular ejection fraction is 50-55% by visual estimate.   1. Single vessel obstructive CAD. There is focal and somewhat hazy lesion in the proximal LAD. iFr was 0.88 with FFR of .82. This is of borderline significance but given her clinical scenario with typical chest pain, Ecg changes, and T wave abnormality along with anterior wall motion abnormality we decided to treat this lesion.  2. Overall good LV function 3. Normal LVEDP 4. Successful stenting of the proximal LAD with DES   Plan: DAPT for one year. Anticipate DC tomorrow.     EKG:  EKG is  ordered today.  The ekg ordered today demonstrates NSR with HR 80  Recent Labs: 11/04/2020: BUN 11; Creatinine, Ser 0.53; Hemoglobin 12.5; Platelets 391.0; Potassium 4.1; Sodium 138 12/16/2020: ALT 53  Recent Lipid Panel    Component Value Date/Time   CHOL 105 07/14/2020 1007   TRIG 469 (H) 07/14/2020 1007   HDL 37 (L) 07/14/2020 1007   CHOLHDL 2.8 07/14/2020 1007   CHOLHDL 7 12/18/2015 1003   VLDL 53.6 (H) 08/15/2014 0734   LDLCALC 7 07/14/2020 1007   LDLDIRECT 23 07/14/2020 1007   LDLDIRECT 85.0 12/18/2015 1003     Risk Assessment/Calculations:       Physical Exam:    VS:  LMP  (LMP Unknown)     Wt Readings from Last 3 Encounters:  12/16/20 137 lb 6.4 oz (62.3 kg)  11/04/20 134 lb 9.6 oz (61.1 kg)  10/22/20 134 lb (60.8 kg)     GEN:  Well nourished, well developed in no acute distress HEENT: Normal NECK: No JVD; No carotid bruits CARDIAC: RRR, no murmurs, rubs, gallops RESPIRATORY:  Clear to auscultation without rales, wheezing or rhonchi  ABDOMEN: Soft, non-tender, non-distended MUSCULOSKELETAL:  No edema; No deformity  SKIN: Warm and dry NEUROLOGIC:  Alert and oriented x 3 PSYCHIATRIC:  Normal affect   ASSESSMENT:    No diagnosis found.  PLAN:    In order of problems listed above:  #Known CAD s/p pLAD stent in 2018: #SOB and chest  tightness Reassuring work-up with myoview without evidence of ishcemia/infarction. Normal LVEF.  -Continue ASA 68m daily -Continue enalapril to 143mdaily -Continue metop 2576mL -Management of HLD as below -Management of suspected GERD per GI;  #HTN: -Continue enalapril 68m64mily -Continue metop 25mg47mdaily    #HLD: #Hypertriglyceridemia Intolerant of  statins.  -Continue repatha 125m q2weeks  -Continue fenofibrate 1412mdaily -Continue vascepa 2g BID -Patient states that she has been compliant with medications since last visit  #Generalized Malaise/Fatigue #Joint Pain: Patient concerned about worsening fatigue, total body aches and joint pain. Attempted to refer to rheumatology but not taking new fibromyalgia patients. Has had multiple sleep studies which were negative for significant OSA. -Management per primary care   #DMII: Having frequent episodes of high blood glucose due to dietary indiscretion. Currently on insulin at night. A1C 8.2 -Referred to endocrinology for further management of DMII -Emphasized importance of dietary compliance     Shared Decision Making/Informed Consent The risks [chest pain, shortness of breath, cardiac arrhythmias, dizziness, blood pressure fluctuations, myocardial infarction, stroke/transient ischemic attack, nausea, vomiting, allergic reaction, radiation exposure, metallic taste sensation and life-threatening complications (estimated to be 1 in 10,000)], benefits (risk stratification, diagnosing coronary artery disease, treatment guidance) and alternatives of a nuclear stress test were discussed in detail with Ms. Eckerson and she agrees to proceed.   Medication Adjustments/Labs and Tests Ordered: Current medicines are reviewed at length with the patient today.  Concerns regarding medicines are outlined above.  No orders of the defined types were placed in this encounter.  No orders of the defined types were placed in this  encounter.   There are no Patient Instructions on file for this visit.    Signed, HeFreada BergeronMD  12/17/2020 1:40 PM    [place]

## 2020-12-19 ENCOUNTER — Ambulatory Visit (HOSPITAL_BASED_OUTPATIENT_CLINIC_OR_DEPARTMENT_OTHER): Payer: BC Managed Care – PPO | Admitting: Cardiology

## 2020-12-19 NOTE — Progress Notes (Incomplete)
Cardiology Office Note:    Date:  12/19/2020   ID:  YOUNG BRIM, DOB 01/18/1961, MRN [id number]  PCP:  Hoyt Koch, MD   [place]  Cardiologist:  Freada Bergeron, MD  Advanced Practice Provider:  No care team member to display Electrophysiologist:  None  :[id number]}   Referring MD: Hoyt Koch, *     History of Present Illness:    [name] is a 60 y.o. female with a hx of CAD s/p DES to pLAD in 03/2017, HLD,  palpitations, HTN, DM and Crohn's disease who was previously followed by Dr. Meda Coffee who now returns to clinic for follow-up.  Had previously seen Dr. Meda Coffee for chest pain where [facility] 07/2019 was low risk without ischemia or scar.   Was seen in clinic on 07/14/20 where she was having fluctuating BP. She followed up with HTN clinic where her home readings were thought to be unreliable due to poor technique. She was continued on enalapril 4may and metop 28mdaily.   Seen in follow-up on 08/11/20 where she was having intermittent episodes of chest tightness and dyspnea on exertion. Myoview 07/2020 showed normal LVEF with no ischemia or infarction.  Today,  She denies chest pain, shortness of breath, palpitations, lightheaddness, headaches, syncope, LE edema, orthopnea, PND.   Past Medical History:  Diagnosis Date   Allergic rhinitis due to pollen    Allergy    Anxiety state, unspecified    Arthritis    Bronchitis    hx   CAD (coronary artery disease)    s/p NSTEMI in 11/18 treated with a DES to the proximal LAD   Calculus of gallbladder without mention of cholecystitis or obstruction    [place]   Depressive disorder, not elsewhere classified    Di[place]   diet controlled   Esophageal reflux    Fibromyalgia    Grade I diastolic dysfunction    Noted on ECHO   Headache    History of nuclear stress test    ETT-Myoview 10/17: EF 55%, normal perfusion; Low Risk //  Nuclear stress test 05/2018:  EF 63, normal perfusion; Low Risk   Myalgia and myositis, unspecified    [place] (non-ST elevated [place] infarction) [place]   Other and unspecified hyperlipidemia    Palpitations    Pneumonia 13   hx   S/P angioplasty with stent 04/18/17 with DES to pLAD 04/19/2017   [place] ([place]   1 seizure as a teenager    Unspecified essential hypertension    Vitamin B deficiency     Past Surgical History:  Procedure Laterality Date   BIOPSY  12/05/2018   Procedure: BIOPSY;  Surgeon: NaMauri PoleMD;  [facility];  Service: Endoscopy;;   CHOLECYSTECTOMY N/A 09/19/2014   Procedure: LAPAROSCOPIC CHOLECYSTECTOMY WITH INTRAOPERATIVE CHOLANGIOGRAM;  Surgeon: MaDonnie MesaMD;  [place] Service: General;  Laterality: N/A;   COLONOSCOPY WITH PROPOFOL N/A 12/05/2018   Procedure: COLONOSCOPY WITH PROPOFOL;  Surgeon: NaMauri PoleMD;  [facility];  Service: Endoscopy;  Laterality: N/A;  chromoendoscopy   CORONARY STENT INTERVENTION N/A 04/18/2017   Procedure: CORONARY STENT INTERVENTION;  Surgeon: JoMartiniquePeter M, MD;  [place] [place] [facility];  Service: Cardiovascular;  Laterality: N/A;   ESOPHAGOGASTRODUODENOSCOPY     EXPLORATORY LAPAROTOMY     for endometriosis   INTRAVASCULAR PRESSURE WIRE/FFR STUDY N/A 04/18/2017   Procedure: INTRAVASCULAR PRESSURE  WIRE/FFR STUDY;  Surgeon: Martinique, Peter M, MD;  Location: [place] CV LAB;  Service: Cardiovascular;  Laterality: N/A;   LEFT HEART CATH AND CORONARY ANGIOGRAPHY N/A 04/18/2017   Procedure: LEFT HEART CATH AND CORONARY ANGIOGRAPHY;  Surgeon: Martinique, Peter M, MD;  Location: [place] CV LAB;  Service: Cardiovascular;  Laterality: N/A;   POLYPECTOMY  12/05/2018   Procedure: POLYPECTOMY;  Surgeon: Mauri Pole, MD;  [facility];  Service: Endoscopy;;   SUBMUCOSAL LIFTING INJECTION  12/05/2018   Procedure: SUBMUCOSAL LIFTING INJECTION;  Surgeon:  Mauri Pole, MD;  [facility];  Service: Endoscopy;;   TONSILLECTOMY AND ADENOIDECTOMY     TUBAL LIGATION     BILATERAL    Current Medications: No outpatient medications have been marked as taking for the 12/19/20 encounter (Appointment) with Freada Bergeron, MD.     Allergies:   Lipitor [atorvastatin], Remicade [infliximab], Ticagrelor, and Rosuvastatin   Social History   Socioeconomic History   Marital status: Married    Spouse name: Dane   Number of children: 2   Years of education: 12+   Highest education level: Not on file  Occupational History   Occupation: ACCOUNT REP.    Employer: [employer]  Tobacco Use   Smoking status: Former    Packs/day: 1.00    Years: 35.00    Pack years: 35.00    Types: Cigarettes    Quit date: 05/31/2004    Years since quitting: 16.5   Smokeless tobacco: Never  Vaping Use   Vaping Use: Never used  Substance and Sexual Activity   Alcohol use: No   Drug use: No   Sexual activity: Not Currently    Comment: married  Other Topics Concern   Not on file  Social History Narrative   Lives with her husband and their pets. Her children are adults and live independently.   Social Determinants of Health   Financial Resource Strain: Not on file  Food Insecurity: Not on file  Transportation Needs: Not on file  Physical Activity: Not on file  Stress: Not on file  Social Connections: Not on file     Family History: The patient's family history includes Coronary artery disease in her brother; Heart attack in her mother; Heart disease in her brother, mother, and sister; Hypertension in her brother, mother, and sister; Lung cancer in her maternal grandmother; Stroke in her father. There is no history of Colon cancer, Stomach cancer, Esophageal cancer, or Rectal cancer.  ROS:   Please see the history of present illness.    Review of Systems  HENT:  Negative for hearing loss and sore throat.   Eyes:   Negative for redness.  Respiratory:  Negative for cough and shortness of breath.   Cardiovascular:  Negative for chest pain, palpitations, orthopnea, claudication, leg swelling and PND.  Gastrointestinal:  Negative for abdominal pain, nausea and vomiting.  Genitourinary:  Negative for dysuria and hematuria.  Musculoskeletal:  Negative for back pain, joint pain and myalgias.  Neurological:  Negative for dizziness and loss of consciousness.  Endo/Heme/Allergies:  Negative for polydipsia.  Psychiatric/Behavioral:  Negative for depression and substance abuse. The patient is not nervous/anxious.    EKGs/Labs/Other Studies Reviewed:    The following studies were reviewed today: Myoview 07/2020: The left ventricular ejection fraction is normal (55-65%). Nuclear stress EF: 65%. There was no ST segment deviation noted during stress. The study is normal.   No evidence for ischemia or infarction.  Normal wall  motion. Stress test 07/2019 Normal perfusion No ischemia or scar The left ventricular ejection fraction is normal (55-65%). There was no ST segment deviation noted during stress. This is a low risk study.   Echo 03/2017 Left ventricle: The cavity size was normal. Wall thickness was    normal. Systolic function was normal. The estimated ejection    fraction was in the range of 55% to 60%. Mild hypokinesis of the    apicalanteroseptal myocardium. Doppler parameters are consistent    with abnormal left ventricular relaxation (grade 1 diastolic    dysfunction).  - Pulmonary arteries: Systolic pressure was mildly increased. PA    peak pressure: 35 mm Hg (S).      CORONARY STENT INTERVENTION   03/2017  INTRAVASCULAR PRESSURE WIRE/FFR STUDY  LEFT HEART CATH AND CORONARY ANGIOGRAPHY      Conclusion     Prox LAD lesion is 65% stenosed. A drug-eluting stent was successfully placed using a STENT PROMUS PREM MR 3.0X12. Post intervention, there is a 0% residual stenosis. The left  ventricular systolic function is normal. LV end diastolic pressure is normal. The left ventricular ejection fraction is 50-55% by visual estimate.   1. Single vessel obstructive CAD. There is focal and somewhat hazy lesion in the proximal LAD. iFr was 0.88 with FFR of .82. This is of borderline significance but given her clinical scenario with typical chest pain, Ecg changes, and T wave abnormality along with anterior wall motion abnormality we decided to treat this lesion.  2. Overall good LV function 3. Normal LVEDP 4. Successful stenting of the proximal LAD with DES   Plan: DAPT for one year. Anticipate DC tomorrow.     EKG:    12/19/2020: ***  Recent Labs: 11/04/2020: BUN 11; Creatinine, Ser 0.53; Hemoglobin 12.5; Platelets 391.0; Potassium 4.1; Sodium 138 12/16/2020: ALT 53  Recent Lipid Panel    Component Value Date/Time   CHOL 105 07/14/2020 1007   TRIG 469 (H) 07/14/2020 1007   HDL 37 (L) 07/14/2020 1007   CHOLHDL 2.8 07/14/2020 1007   CHOLHDL 7 12/18/2015 1003   VLDL 53.6 (H) 08/15/2014 0734   LDLCALC 7 07/14/2020 1007   LDLDIRECT 23 07/14/2020 1007   LDLDIRECT 85.0 12/18/2015 1003     Risk Assessment/Calculations:       Physical Exam:    VS:  LMP  (LMP Unknown)     Wt Readings from Last 3 Encounters:  12/16/20 137 lb 6.4 oz (62.3 kg)  11/04/20 134 lb 9.6 oz (61.1 kg)  10/22/20 134 lb (60.8 kg)     GEN:  Well nourished, well developed in no acute distress HEENT: Normal NECK: No JVD; No carotid bruits CARDIAC: RRR, no murmurs, rubs, gallops RESPIRATORY:  Clear to auscultation without rales, wheezing or rhonchi  ABDOMEN: Soft, non-tender, non-distended MUSCULOSKELETAL:  No edema; No deformity  SKIN: Warm and dry NEUROLOGIC:  Alert and oriented x 3 PSYCHIATRIC:  Normal affect   ASSESSMENT:    No diagnosis found.  PLAN:    In order of problems listed above:  #Known CAD s/p pLAD stent in 2018: #SOB and chest tightness Reassuring work-up with  myoview without evidence of ishcemia/infarction. Normal LVEF.  -Continue ASA 66m daily -Continue enalapril to 142mdaily -Continue metop 2543mL -Management of HLD as below -Management of suspected GERD per GI;  #HTN: -Continue enalapril 85m25mily -Continue metop 25mg76mdaily    #HLD: #Hypertriglyceridemia Intolerant of statins.  -Continue repatha 140mg 62meks  -Continue fenofibrate 145mg d49m -  Continue vascepa 2g BID -Patient states that she has been compliant with medications since last visit  #Generalized Malaise/Fatigue #Joint Pain: Patient concerned about worsening fatigue, total body aches and joint pain. Attempted to refer to rheumatology but not taking new fibromyalgia patients. Has had multiple sleep studies which were negative for significant OSA. -Management per primary care   #DMII: Having frequent episodes of high blood glucose due to dietary indiscretion. Currently on insulin at night. A1C 8.2 -Referred to endocrinology for further management of DMII -Emphasized importance of dietary compliance     Shared Decision Making/Informed Consent The risks [chest pain, shortness of breath, cardiac arrhythmias, dizziness, blood pressure fluctuations, myocardial infarction, stroke/transient ischemic attack, nausea, vomiting, allergic reaction, radiation exposure, metallic taste sensation and life-threatening complications (estimated to be 1 in 10,000)], benefits (risk stratification, diagnosing coronary artery disease, treatment guidance) and alternatives of a nuclear stress test were discussed in detail with Ms. Jobe and she agrees to proceed.  Follow-up in ***  Medication Adjustments/Labs and Tests Ordered: Current medicines are reviewed at length with the patient today.  Concerns regarding medicines are outlined above.  No orders of the defined types were placed in this encounter.  No orders of the defined types were placed in this encounter.   There are no  Patient Instructions on file for this visit.    I,Zite Okoli,acting as a Education administrator for Freada Bergeron, MD.,have documented all relevant documentation on the behalf of Freada Bergeron, MD,as directed by  Freada Bergeron, MD while in the presence of Freada Bergeron, MD.   ***  Signed, Joaquin Music  12/19/2020 10:24 AM    [place]

## 2020-12-22 MED ORDER — TRESIBA FLEXTOUCH 100 UNIT/ML ~~LOC~~ SOPN: 48.0000 [IU] | PEN_INJECTOR | Freq: Every day | SUBCUTANEOUS | 11 refills | Status: DC

## 2020-12-22 NOTE — Addendum Note (Signed)
Addended by: Pricilla Holm A on: 12/22/2020 11:03 AM   Modules accepted: Orders

## 2020-12-23 ENCOUNTER — Other Ambulatory Visit (HOSPITAL_COMMUNITY): Payer: Self-pay

## 2020-12-23 ENCOUNTER — Telehealth: Payer: Self-pay

## 2020-12-23 MED ORDER — DOXYCYCLINE HYCLATE 100 MG PO TABS
100.0000 mg | ORAL_TABLET | Freq: Two times a day (BID) | ORAL | 0 refills | Status: DC
Start: 2020-12-05 — End: 2021-01-19

## 2020-12-23 MED ORDER — EPINEPHRINE 0.3 MG/0.3ML IJ SOAJ: INTRAMUSCULAR | 0 refills | Status: DC

## 2020-12-23 MED ORDER — ENALAPRIL MALEATE 10 MG PO TABS
10.0000 mg | ORAL_TABLET | Freq: Every day | ORAL | 2 refills | Status: DC
  Filled 2021-01-05: qty 30, 30d supply, fill #0
  Filled 2021-02-06: qty 30, 30d supply, fill #1
  Filled 2021-02-27: qty 30, 30d supply, fill #2
  Filled 2021-03-26: qty 30, 30d supply, fill #3
  Filled 2021-04-25 (×2): qty 30, 30d supply, fill #4
  Filled 2021-06-06: qty 30, 30d supply, fill #5
  Filled 2021-07-16: qty 30, 30d supply, fill #6

## 2020-12-23 MED ORDER — PANTOPRAZOLE SODIUM 40 MG PO TBEC
40.0000 mg | DELAYED_RELEASE_TABLET | Freq: Every day | ORAL | 0 refills | Status: DC
  Filled 2021-01-19: qty 30, 30d supply, fill #0

## 2020-12-23 MED ORDER — CYANOCOBALAMIN 1000 MCG/ML IJ SOLN
INTRAMUSCULAR | 0 refills | Status: DC
Start: 2020-12-17 — End: 2021-01-19

## 2020-12-23 MED ORDER — REPATHA SURECLICK 140 MG/ML ~~LOC~~ SOAJ: SUBCUTANEOUS | 0 refills | Status: DC

## 2020-12-23 MED ORDER — BUSPIRONE HCL 10 MG PO TABS
10.0000 mg | ORAL_TABLET | Freq: Two times a day (BID) | ORAL | 0 refills | Status: DC
Start: 2020-12-05 — End: 2023-05-31
  Filled 2021-01-05: qty 60, 30d supply, fill #0

## 2020-12-23 MED ORDER — INSULIN GLARGINE-YFGN 100 UNIT/ML ~~LOC~~ SOPN
PEN_INJECTOR | SUBCUTANEOUS | 3 refills | Status: DC
  Filled 2020-12-23: qty 15, 30d supply, fill #0
  Filled 2021-03-24: qty 15, 30d supply, fill #1
  Filled 2021-04-20: qty 15, 30d supply, fill #2
  Filled 2021-05-21: qty 15, 30d supply, fill #3

## 2020-12-23 MED ORDER — REPATHA SURECLICK 140 MG/ML ~~LOC~~ SOAJ
SUBCUTANEOUS | 2 refills | Status: DC
  Filled 2020-12-24 – 2021-01-19 (×2): qty 2, 30d supply, fill #0
  Filled 2021-02-16: qty 2, 30d supply, fill #1
  Filled 2021-03-26: qty 2, 30d supply, fill #2

## 2020-12-23 MED ORDER — BUDESONIDE 3 MG PO CPEP: 9.0000 mg | ORAL_CAPSULE | Freq: Every day | ORAL | 0 refills | Status: DC

## 2020-12-23 MED ORDER — METFORMIN HCL ER 500 MG PO TB24: 2000.0000 mg | ORAL_TABLET | Freq: Every day | ORAL | 2 refills | Status: DC

## 2020-12-23 MED ORDER — CHOLESTYRAMINE LIGHT 4 G PO PACK: PACK | ORAL | 3 refills | Status: DC

## 2020-12-23 MED ORDER — PANTOPRAZOLE SODIUM 40 MG PO TBEC
40.0000 mg | DELAYED_RELEASE_TABLET | Freq: Every day | ORAL | 3 refills | Status: DC
  Filled 2020-12-23: qty 30, 30d supply, fill #0

## 2020-12-23 MED ORDER — CHOLESTYRAMINE LIGHT 4 G PO PACK: 1.0000 | PACK | Freq: Every day | ORAL | 4 refills | Status: DC

## 2020-12-23 MED ORDER — MONTELUKAST SODIUM 10 MG PO TABS
10.0000 mg | ORAL_TABLET | Freq: Every day | ORAL | 0 refills | Status: DC
  Filled 2021-01-19: qty 30, 30d supply, fill #0
  Filled 2021-02-16: qty 30, 30d supply, fill #1
  Filled 2021-03-17: qty 30, 30d supply, fill #2

## 2020-12-23 MED ORDER — METOPROLOL SUCCINATE ER 25 MG PO TB24
25.0000 mg | ORAL_TABLET | Freq: Every day | ORAL | 0 refills | Status: DC
Start: 2020-12-05 — End: 2021-01-19

## 2020-12-23 MED ORDER — CYANOCOBALAMIN 1000 MCG/ML IJ SOLN: INTRAMUSCULAR | 11 refills | Status: DC

## 2020-12-23 MED ORDER — METOPROLOL SUCCINATE ER 25 MG PO TB24: 25.0000 mg | ORAL_TABLET | Freq: Every day | ORAL | 4 refills | Status: DC

## 2020-12-23 MED ORDER — ENALAPRIL MALEATE 5 MG PO TABS: 5.0000 mg | ORAL_TABLET | Freq: Every day | ORAL | 0 refills | Status: DC

## 2020-12-23 NOTE — Telephone Encounter (Signed)
   Patient calling to report Tresiba $200+ Unable to afford

## 2020-12-23 NOTE — Chronic Care Management (AMB) (Signed)
Spoke with patient,   Patient has been signed up for tresiba copay card, prescription copay is $99 which patient reports is affordable, will pick up at this time, has copay card for her repatha, will reach out with any other issues or concerns   Tomasa Blase, PharmD Clinical Pharmacist, [facility]

## 2020-12-24 ENCOUNTER — Other Ambulatory Visit (HOSPITAL_COMMUNITY): Payer: Self-pay

## 2021-01-05 ENCOUNTER — Other Ambulatory Visit: Payer: Self-pay | Admitting: Internal Medicine

## 2021-01-05 ENCOUNTER — Other Ambulatory Visit: Payer: Self-pay | Admitting: Cardiology

## 2021-01-05 ENCOUNTER — Other Ambulatory Visit (HOSPITAL_COMMUNITY): Payer: Self-pay

## 2021-01-05 ENCOUNTER — Other Ambulatory Visit: Payer: Self-pay | Admitting: Physician Assistant

## 2021-01-05 MED ORDER — FENOFIBRATE 145 MG PO TABS
145.0000 mg | ORAL_TABLET | Freq: Every day | ORAL | 3 refills | Status: DC
  Filled 2021-01-05: qty 30, 30d supply, fill #0
  Filled 2021-02-06: qty 30, 30d supply, fill #1
  Filled 2021-02-27: qty 30, 30d supply, fill #2
  Filled 2021-03-26: qty 30, 30d supply, fill #3
  Filled 2021-05-14: qty 30, 30d supply, fill #4
  Filled 2021-06-19: qty 30, 30d supply, fill #5
  Filled 2021-08-03: qty 30, 30d supply, fill #6
  Filled 2021-09-11: qty 30, 30d supply, fill #7
  Filled 2021-10-15: qty 30, 30d supply, fill #8
  Filled 2021-11-19: qty 30, 30d supply, fill #9

## 2021-01-06 ENCOUNTER — Other Ambulatory Visit (HOSPITAL_COMMUNITY): Payer: Self-pay

## 2021-01-07 ENCOUNTER — Other Ambulatory Visit: Payer: Self-pay

## 2021-01-07 ENCOUNTER — Other Ambulatory Visit (HOSPITAL_COMMUNITY): Payer: Self-pay

## 2021-01-07 MED ORDER — VASCEPA 1 G PO CAPS
2.0000 g | ORAL_CAPSULE | Freq: Two times a day (BID) | ORAL | 3 refills | Status: DC
  Filled 2021-01-07: qty 120, 30d supply, fill #0
  Filled 2021-03-17: qty 120, 30d supply, fill #1
  Filled 2021-05-14: qty 120, 30d supply, fill #2

## 2021-01-10 ENCOUNTER — Other Ambulatory Visit (HOSPITAL_COMMUNITY): Payer: Self-pay

## 2021-01-12 ENCOUNTER — Other Ambulatory Visit (HOSPITAL_COMMUNITY): Payer: Self-pay

## 2021-01-12 DIAGNOSIS — J019 Acute sinusitis, unspecified: Secondary | ICD-10-CM | POA: Diagnosis not present

## 2021-01-12 DIAGNOSIS — R051 Acute cough: Secondary | ICD-10-CM | POA: Diagnosis not present

## 2021-01-12 DIAGNOSIS — H9313 Tinnitus, bilateral: Secondary | ICD-10-CM | POA: Diagnosis not present

## 2021-01-14 ENCOUNTER — Other Ambulatory Visit (HOSPITAL_COMMUNITY): Payer: Self-pay

## 2021-01-19 ENCOUNTER — Telehealth: Payer: Self-pay | Admitting: Gastroenterology

## 2021-01-19 ENCOUNTER — Other Ambulatory Visit: Payer: Self-pay

## 2021-01-19 ENCOUNTER — Telehealth: Payer: Self-pay | Admitting: Internal Medicine

## 2021-01-19 ENCOUNTER — Other Ambulatory Visit (HOSPITAL_COMMUNITY): Payer: Self-pay

## 2021-01-19 ENCOUNTER — Ambulatory Visit: Payer: BC Managed Care – PPO | Admitting: Internal Medicine

## 2021-01-19 ENCOUNTER — Encounter: Payer: Self-pay | Admitting: Internal Medicine

## 2021-01-19 VITALS — BP 122/80 | HR 67 | Temp 98.2°F | Resp 18 | Ht 62.0 in | Wt 136.6 lb

## 2021-01-19 DIAGNOSIS — F411 Generalized anxiety disorder: Secondary | ICD-10-CM

## 2021-01-19 DIAGNOSIS — F331 Major depressive disorder, recurrent, moderate: Secondary | ICD-10-CM

## 2021-01-19 DIAGNOSIS — Z8249 Family history of ischemic heart disease and other diseases of the circulatory system: Secondary | ICD-10-CM

## 2021-01-19 DIAGNOSIS — E118 Type 2 diabetes mellitus with unspecified complications: Secondary | ICD-10-CM

## 2021-01-19 DIAGNOSIS — G720 Drug-induced myopathy: Secondary | ICD-10-CM

## 2021-01-19 DIAGNOSIS — Z794 Long term (current) use of insulin: Secondary | ICD-10-CM

## 2021-01-19 DIAGNOSIS — K50813 Crohn's disease of both small and large intestine with fistula: Secondary | ICD-10-CM

## 2021-01-19 DIAGNOSIS — E1169 Type 2 diabetes mellitus with other specified complication: Secondary | ICD-10-CM

## 2021-01-19 DIAGNOSIS — T466X5A Adverse effect of antihyperlipidemic and antiarteriosclerotic drugs, initial encounter: Secondary | ICD-10-CM

## 2021-01-19 DIAGNOSIS — M25511 Pain in right shoulder: Secondary | ICD-10-CM | POA: Diagnosis not present

## 2021-01-19 DIAGNOSIS — M25512 Pain in left shoulder: Secondary | ICD-10-CM | POA: Diagnosis not present

## 2021-01-19 LAB — POCT GLYCOSYLATED HEMOGLOBIN (HGB A1C): Hemoglobin A1C: 8.9 % — AB (ref 4.0–5.6)

## 2021-01-19 MED ORDER — COLESTIPOL HCL 1 G PO TABS
1.0000 g | ORAL_TABLET | Freq: Every day | ORAL | 1 refills | Status: DC
  Filled 2021-01-19: qty 30, 30d supply, fill #0
  Filled 2021-02-16: qty 30, 30d supply, fill #1
  Filled 2021-03-17: qty 30, 30d supply, fill #2
  Filled 2021-04-25: qty 30, 30d supply, fill #3
  Filled 2021-06-19: qty 30, 30d supply, fill #4
  Filled 2021-08-03: qty 30, 30d supply, fill #5

## 2021-01-19 MED ORDER — METFORMIN HCL ER 500 MG PO TB24
1000.0000 mg | ORAL_TABLET | Freq: Every day | ORAL | 3 refills | Status: DC
  Filled 2021-01-19: qty 60, 30d supply, fill #0
  Filled 2021-02-16: qty 60, 30d supply, fill #1
  Filled 2021-03-17: qty 60, 30d supply, fill #2
  Filled 2021-04-20: qty 60, 30d supply, fill #3
  Filled 2021-05-14: qty 60, 30d supply, fill #4
  Filled 2021-06-19: qty 60, 30d supply, fill #5

## 2021-01-19 MED ORDER — DULOXETINE HCL 60 MG PO CPEP
60.0000 mg | ORAL_CAPSULE | Freq: Every day | ORAL | 3 refills | Status: DC
  Filled 2021-01-19: qty 30, 30d supply, fill #0
  Filled 2021-02-27: qty 30, 30d supply, fill #1
  Filled 2021-03-26: qty 30, 30d supply, fill #2
  Filled 2021-04-30: qty 30, 30d supply, fill #3
  Filled 2021-06-06: qty 30, 30d supply, fill #4
  Filled 2021-07-07: qty 30, 30d supply, fill #5
  Filled 2021-08-03: qty 30, 30d supply, fill #6
  Filled 2021-09-11: qty 30, 30d supply, fill #7
  Filled 2021-10-15: qty 30, 30d supply, fill #8
  Filled 2021-11-19: qty 30, 30d supply, fill #9
  Filled 2021-12-14: qty 30, 30d supply, fill #10
  Filled 2022-01-15: qty 30, 30d supply, fill #11

## 2021-01-19 MED ORDER — EMPAGLIFLOZIN 10 MG PO TABS
10.0000 mg | ORAL_TABLET | Freq: Every day | ORAL | 3 refills | Status: DC
  Filled 2021-01-19 (×3): qty 30, 30d supply, fill #0

## 2021-01-19 NOTE — Progress Notes (Signed)
   Subjective:   Patient ID: [name], female    DOB: [birth year], 60 y.o.   MRN: [id number]  HPI The patient is a 60 YO female coming in for follow up diabetes. Concerns about how many medications she is taking. Still out of repatha.   Review of Systems  Constitutional: Negative.   HENT: Negative.    Eyes: Negative.   Respiratory:  Negative for cough, chest tightness and shortness of breath.   Cardiovascular:  Negative for chest pain, palpitations and leg swelling.  Gastrointestinal:  Positive for diarrhea. Negative for abdominal distention, abdominal pain, constipation, nausea and vomiting.  Musculoskeletal: Negative.   Skin: Negative.   Neurological: Negative.   Psychiatric/Behavioral: Negative.     Objective:  Physical Exam Constitutional:      Appearance: She is well-developed.  HENT:     Head: Normocephalic and atraumatic.  Cardiovascular:     Rate and Rhythm: Normal rate and regular rhythm.  Pulmonary:     Effort: Pulmonary effort is normal. No respiratory distress.     Breath sounds: Normal breath sounds. No wheezing or rales.  Abdominal:     General: Bowel sounds are normal. There is no distension.     Palpations: Abdomen is soft.     Tenderness: There is no abdominal tenderness. There is no rebound.  Musculoskeletal:     Cervical back: Normal range of motion.  Skin:    General: Skin is warm and dry.  Neurological:     Mental Status: She is alert and oriented to person, place, and time.     Coordination: Coordination normal.    Vitals:   01/19/21 1010  BP: 122/80  Pulse: 67  Resp: 18  Temp: 98.2 F (36.8 C)  TempSrc: Oral  SpO2: 97%  Weight: 136 lb 9.6 oz (62 kg)  Height: 5' 2"  (1.575 m)    This visit occurred during the SARS-CoV-2 public health emergency.  Safety protocols were in place, including screening questions prior to the visit, additional usage of staff PPE, and extensive cleaning of exam room while observing appropriate contact time as  indicated for disinfecting solutions.   Assessment & Plan:

## 2021-01-19 NOTE — Telephone Encounter (Signed)
Med sent to pharmacy as requested by patient

## 2021-01-19 NOTE — Telephone Encounter (Signed)
   Pt c/o medication issue:  1. Name of Medication: empagliflozin (JARDIANCE) 10 MG TABS tablet  2. How are you currently taking this medication (dosage and times per day)? N/A  3. Are you having a reaction (difficulty breathing--STAT)? N/A  4. What is your medication issue? Medication too costly. The Jardiance will be $90 for 30 day supply

## 2021-01-19 NOTE — Telephone Encounter (Signed)
Inbound call from patient requesting medication refill for colestipol sent to [facility]

## 2021-01-19 NOTE — Patient Instructions (Addendum)
We have sent in the cymbalta   Colestipol is for diarrhea so you could try stopping this to see if the diarrhea does not change.  The cholestyramine is for diarrhea so if you take this and it helps take it.  It is okay to try taking either xyzal or singulair to see if just one can help with allergies.   We will stop actos and have you take 2 metformin daily and we will add jardiance to take 1 pill daily.

## 2021-01-19 NOTE — Telephone Encounter (Signed)
See below

## 2021-01-20 ENCOUNTER — Ambulatory Visit: Payer: BC Managed Care – PPO | Admitting: Internal Medicine

## 2021-01-20 NOTE — Telephone Encounter (Signed)
Would she prefer to keep her current metformin and actos then?

## 2021-01-21 NOTE — Telephone Encounter (Signed)
Spoke with the patient and she stated that should would like to keep her metformin and actos.

## 2021-01-21 NOTE — Assessment & Plan Note (Signed)
She was interested in reducing medications today. POC HgA1c 6.9 on current regimen metformin 2000 mg daily and lantus 48 units and actos. We will stop actos and reduce metformin to 1000 mg daily and add jardiance and stop lantus 48 units to help simplify regimen. She has been out of metformin for 1-2 weeks and did not notice change in diarrhea symptoms so do not feel this is impacting her diarrhea in a meaningful way.

## 2021-01-21 NOTE — Assessment & Plan Note (Signed)
We did discuss her medications for her crohn's given that she is struggling with pill burden and discussed prn medications which are for symptom management only including colestipol and cholestyramine that she can try stopping to see if diarrhea symptoms change. She understands that viberzi is for management of her disease as well as the budesonide.

## 2021-01-21 NOTE — Assessment & Plan Note (Signed)
Has been out of cymbalta due to lack of refills. Refilled cymbalta 60 mg daily today as she had good control on this.

## 2021-01-21 NOTE — Assessment & Plan Note (Signed)
We did discuss that buspar can be used prn only for anxiety attacks and she has been taking scheduled. She will move to prn only and let us know if she notices a difference. She is out of cymbalta for 1-2 weeks and has noticed difference. This is refilled to go back on.

## 2021-01-21 NOTE — Assessment & Plan Note (Signed)
We did talk about repatha and fenofibrate and vascepa.

## 2021-01-21 NOTE — Assessment & Plan Note (Signed)
She is not taking repatha currently for unclear reason. She has refills and it appears to be approved. She is asked to call her pharmacy and if there are problems to let us know.

## 2021-01-22 ENCOUNTER — Telehealth: Payer: Self-pay | Admitting: Internal Medicine

## 2021-01-22 ENCOUNTER — Ambulatory Visit (HOSPITAL_BASED_OUTPATIENT_CLINIC_OR_DEPARTMENT_OTHER): Payer: BC Managed Care – PPO | Admitting: Family

## 2021-01-22 NOTE — Telephone Encounter (Signed)
Patient calling in about recent med she was prescribed  Patient was prescribed DULoxetine (CYMBALTA) 60 MG capsule on 08.22.22  She said she has been having severe headaches ever since shes started taking meds.. she thinks it may be too strong  Wants to know if provider can send a diff kind of med to pharmacy for her  Pharmacy:  [place]  Phone:  [phone] Fax:  [phone]

## 2021-01-22 NOTE — Telephone Encounter (Signed)
See below

## 2021-01-23 ENCOUNTER — Other Ambulatory Visit (HOSPITAL_COMMUNITY): Payer: Self-pay

## 2021-01-23 MED ORDER — DULOXETINE HCL 30 MG PO CPEP
30.0000 mg | ORAL_CAPSULE | Freq: Every day | ORAL | 0 refills | Status: DC
  Filled 2021-01-23: qty 14, 14d supply, fill #0

## 2021-01-23 NOTE — Telephone Encounter (Signed)
Have sent in lower dose 30 mg cymbalta to switch to for 2 weeks then resume 60 mg daily cymbalta. Typically headaches would go away within 1-2 weeks of starting this medicine since she was off this could be why she is having these.

## 2021-01-23 NOTE — Telephone Encounter (Signed)
Spoke with the patient and she verbalized understanding instructions given by Dr. Sharlet Salina. No questions or concerns at this time.

## 2021-02-03 DIAGNOSIS — Z111 Encounter for screening for respiratory tuberculosis: Secondary | ICD-10-CM | POA: Diagnosis not present

## 2021-02-03 DIAGNOSIS — R5383 Other fatigue: Secondary | ICD-10-CM | POA: Diagnosis not present

## 2021-02-03 DIAGNOSIS — Z79899 Other long term (current) drug therapy: Secondary | ICD-10-CM | POA: Diagnosis not present

## 2021-02-03 DIAGNOSIS — K50813 Crohn's disease of both small and large intestine with fistula: Secondary | ICD-10-CM | POA: Diagnosis not present

## 2021-02-06 ENCOUNTER — Other Ambulatory Visit (HOSPITAL_COMMUNITY): Payer: Self-pay

## 2021-02-13 ENCOUNTER — Emergency Department (HOSPITAL_COMMUNITY): Payer: BC Managed Care – PPO

## 2021-02-13 ENCOUNTER — Telehealth: Payer: Self-pay | Admitting: Cardiology

## 2021-02-13 ENCOUNTER — Emergency Department (HOSPITAL_COMMUNITY)
Admission: EM | Admit: 2021-02-13 | Discharge: 2021-02-14 | Disposition: A | Discharge status: Home / Self Care | Payer: BC Managed Care – PPO | Attending: Emergency Medicine | Admitting: Emergency Medicine

## 2021-02-13 ENCOUNTER — Other Ambulatory Visit: Payer: Self-pay

## 2021-02-13 ENCOUNTER — Encounter (HOSPITAL_COMMUNITY): Payer: Self-pay

## 2021-02-13 DIAGNOSIS — I2510 Atherosclerotic heart disease of native coronary artery without angina pectoris: Secondary | ICD-10-CM | POA: Insufficient documentation

## 2021-02-13 DIAGNOSIS — R053 Chronic cough: Secondary | ICD-10-CM | POA: Diagnosis not present

## 2021-02-13 DIAGNOSIS — E119 Type 2 diabetes mellitus without complications: Secondary | ICD-10-CM | POA: Diagnosis not present

## 2021-02-13 DIAGNOSIS — Z7951 Long term (current) use of inhaled steroids: Secondary | ICD-10-CM | POA: Insufficient documentation

## 2021-02-13 DIAGNOSIS — Z7984 Long term (current) use of oral hypoglycemic drugs: Secondary | ICD-10-CM | POA: Diagnosis not present

## 2021-02-13 DIAGNOSIS — R0602 Shortness of breath: Secondary | ICD-10-CM | POA: Diagnosis not present

## 2021-02-13 DIAGNOSIS — I119 Hypertensive heart disease without heart failure: Secondary | ICD-10-CM | POA: Diagnosis not present

## 2021-02-13 DIAGNOSIS — R079 Chest pain, unspecified: Secondary | ICD-10-CM | POA: Diagnosis not present

## 2021-02-13 DIAGNOSIS — R072 Precordial pain: Secondary | ICD-10-CM

## 2021-02-13 DIAGNOSIS — Z7982 Long term (current) use of aspirin: Secondary | ICD-10-CM | POA: Insufficient documentation

## 2021-02-13 DIAGNOSIS — Z79899 Other long term (current) drug therapy: Secondary | ICD-10-CM | POA: Diagnosis not present

## 2021-02-13 DIAGNOSIS — Z87891 Personal history of nicotine dependence: Secondary | ICD-10-CM | POA: Diagnosis not present

## 2021-02-13 DIAGNOSIS — Z794 Long term (current) use of insulin: Secondary | ICD-10-CM | POA: Insufficient documentation

## 2021-02-13 LAB — CBC
HCT: 42.5 % (ref 36.0–46.0)
Hemoglobin: 13.7 g/dL (ref 12.0–15.0)
MCH: 29.1 pg (ref 26.0–34.0)
MCHC: 32.2 g/dL (ref 30.0–36.0)
MCV: 90.2 fL (ref 80.0–100.0)
Platelets: 305 10*3/uL (ref 150–400)
RBC: 4.71 MIL/uL (ref 3.87–5.11)
RDW: 13.6 % (ref 11.5–15.5)
WBC: 10.7 10*3/uL — ABNORMAL HIGH (ref 4.0–10.5)
nRBC: 0 % (ref 0.0–0.2)

## 2021-02-13 LAB — DIFFERENTIAL
Abs Immature Granulocytes: 0.08 10*3/uL — ABNORMAL HIGH (ref 0.00–0.07)
Basophils Absolute: 0.1 10*3/uL (ref 0.0–0.1)
Basophils Relative: 1 %
Eosinophils Absolute: 0.3 10*3/uL (ref 0.0–0.5)
Eosinophils Relative: 2 %
Immature Granulocytes: 1 %
Lymphocytes Relative: 29 %
Lymphs Abs: 3.1 10*3/uL (ref 0.7–4.0)
Monocytes Absolute: 1 10*3/uL (ref 0.1–1.0)
Monocytes Relative: 9 %
Neutro Abs: 6.2 10*3/uL (ref 1.7–7.7)
Neutrophils Relative %: 58 %

## 2021-02-13 LAB — TROPONIN I (HIGH SENSITIVITY)
Troponin I (High Sensitivity): 6 ng/L (ref <18)
Troponin I (High Sensitivity): 5 ng/L (ref <18)

## 2021-02-13 LAB — COMPREHENSIVE METABOLIC PANEL
ALT: 33 U/L (ref 0–44)
AST: 30 U/L (ref 15–41)
Albumin: 3.4 g/dL — ABNORMAL LOW (ref 3.5–5.0)
Alkaline Phosphatase: 84 U/L (ref 38–126)
Anion gap: 10 (ref 5–15)
BUN: 12 mg/dL (ref 8–23)
CO2: 24 mmol/L (ref 22–32)
Calcium: 9.3 mg/dL (ref 8.9–10.3)
Chloride: 103 mmol/L (ref 98–111)
Creatinine, Ser: 0.68 mg/dL (ref 0.44–1.00)
GFR, Estimated: >60 mL/min (ref >60)
Glucose, Bld: 153 mg/dL — ABNORMAL HIGH (ref 70–99)
Potassium: 4.1 mmol/L (ref 3.5–5.1)
Sodium: 137 mmol/L (ref 135–145)
Total Bilirubin: 0.3 mg/dL (ref 0.3–1.2)
Total Protein: 6.1 g/dL — ABNORMAL LOW (ref 6.5–8.1)

## 2021-02-13 NOTE — ED Provider Notes (Signed)
Emergency Medicine Provider Triage Evaluation Note  [name] , a 60 y.o. female  was evaluated in triage.  Pt complains of chest pain and shortness of breath x1-1/2 days.  It is intermittent, radiates to the back and her left arm.  History of prior MI 3 years ago, states it feels similar.  Review of Systems  Positive: Chest pain, shortness of breath Negative: N/v  Physical Exam  BP 139/63 (BP Location: Left Arm)   Pulse 81   Temp 98.6 F (37 C) (Oral)   Resp 16   LMP  (LMP Unknown)   SpO2 100%  Gen:   Awake, no distress   Resp:  Normal effort  MSK:   Moves extremities without difficulty  Other:  Lungs clear to auscultation bilaterally, no leg swelling.  Medical Decision Making  Medically screening exam initiated at 5:32 PM.  Appropriate orders placed.  [name] was informed that the remainder of the evaluation will be completed by another provider, this initial triage assessment does not replace that evaluation, and the importance of remaining in the ED until their evaluation is complete.  Chest pain work-up   Sherrill Raring, Hershal Coria 02/13/21 1733    Godfrey Pick, MD 02/15/21 [phone]

## 2021-02-13 NOTE — Telephone Encounter (Signed)
Pt c/o of Chest Pain: STAT if CP now or developed within 24 hours  1. Are you having CP right now? No   2. Are you experiencing any other symptoms (ex. SOB, nausea, vomiting, sweating)? Tachycardia, nausea (has all the time) and SOB  3. How long have you been experiencing CP? Started today   4. Is your CP continuous or coming and going? Coming and going when she exerts herself   5. Have you taken Nitroglycerin? No    CP & sob upon exertion. Cardia mobile advised she was having tachycardia. HR was 105 when tachycardia this morning, between 11 and 12.  ?

## 2021-02-13 NOTE — Telephone Encounter (Signed)
Spoke with pt who reports intermittent chest discomfort over the past 2 days.  Pt states she had a fairly significant episode of CP while cleaning out her car.  She did have some nausea with radiation to her back.  She did not take any nitro but rather came inside to "cool off" and rest.  Pain ultimately resolved in about 10 minutes.  Current BP 161/82 with HR of 93.  Pt with history of MI and stent placement 2-3 years ago per pt.   Provided education re: nitro, however; pt reported 2 additional short episodes of CP while on the phone with RN and reports consistent discomfort between her shoulder blades in her back.   Pt was then directed to ED for further evaluation d/t history.  Pt verbalizes understanding and agrees with current plan.

## 2021-02-13 NOTE — ED Triage Notes (Signed)
Pt reports chest pain and sob that started earlier today.

## 2021-02-14 NOTE — ED Provider Notes (Signed)
[place] [facility] EMERGENCY DEPARTMENT Provider Note   CSN: [id number] Arrival date & time: 02/13/21  1725     History Chief Complaint  Patient presents with   Chest Pain   Shortness of Breath    [name] is a 60 y.o. female.  The history is provided by the patient and medical records.  Chest Pain Associated symptoms: shortness of breath   Shortness of Breath Associated symptoms: chest pain   [name] is a 60 y.o. female who presents to the Emergency Department complaining of chest pain.  She presents to the ED complaining of central chest pain that radiates to her back.  Pain started a few days ago. She has associated DOE.  Had prior similar sxs in the past and had an MI.  Pain is described as hurting.  Pain comes and goes, present for up to a minute at a time, worse with exertion.    No fever.  Has cough (chronic).  Has some suprapubic pain for the last few weeks, intermittent, non currently.  No dysuria.  Has a hx/o crohn's.  No leg pain/swelling.    [facility]es  [facility].  Takes 62m ASA daily.      Past Medical History:  Diagnosis Date   Allergic rhinitis due to pollen    Allergy    Anxiety state, unspecified    Arthritis    Bronchitis    hx   CAD (coronary artery disease)    s/p NSTEMI in 11/18 treated with a DES to the proximal LAD   Calculus of gallbladder without mention of cholecystitis or obstruction    Crohn disease ([facility]    Depressive disorder, not elsewhere classified    Diabetes mellitus without complication ([facility]    diet controlled   Esophageal reflux    Fibromyalgia    Grade I diastolic dysfunction    Noted on ECHO   Headache    History of nuclear stress test    ETT-Myoview 10/17: EF 55%, normal perfusion; Low Risk // Nuclear stress test 05/2018:  EF 63, normal perfusion; Low Risk   Myalgia and myositis, unspecified    NSTEMI (non-ST elevated myocardial infarction) ([facility] 04/15/2017   Other and unspecified hyperlipidemia     Palpitations    Pneumonia 13   hx   S/P angioplasty with stent 04/18/17 with DES to pLAD 04/19/2017   Seizures ([facility]    1 seizure as a teenager    Unspecified essential hypertension    Vitamin B deficiency     Patient Active Problem List   Diagnosis Date Noted   Aortic atherosclerosis ([facility] [place] 11/04/2020   Nausea & vomiting 04/17/2020   Statin myopathy 09/07/2019   Myalgia due to HMG CoA reductase inhibitor 02/16/2019   Adenomatous polyp of cecum    Chronic fatigue 01/14/2018   Obstructive sleep apnea 09/17/2017   Diabetes mellitus with complication ([facility] 086/57/8469  S/P angioplasty with stent 04/18/17 with DES to pLAD 04/19/2017   SOB (shortness of breath)    Family history of early CAD    Fatty infiltration of liver 12/23/2015   Coronary artery disease involving native coronary artery of native heart without angina pectoris 10/10/2011   Essential hypertension 08/18/2009   B12 DEFICIENCY 01/06/2009   Hyperlipidemia LDL goal <70 03/16/2007   Anxiety state 03/16/2007   MDD (major depressive disorder), recurrent episode, moderate ([facility] 03/16/2007   Seasonal and perennial allergic rhinitis 03/16/2007   GERD 03/16/2007   SACROILIITIS NEC 03/16/2007   FIBROMYALGIA  03/16/2007   PALPITATIONS 03/16/2007   Crohn's disease ([place]) 03/16/2007    Past Surgical History:  Procedure Laterality Date   BIOPSY  12/05/2018   Procedure: BIOPSY;  Surgeon: Mauri Pole, MD;  Location: [facility];  Service: Endoscopy;;   CHOLECYSTECTOMY N/A 09/19/2014   Procedure: LAPAROSCOPIC CHOLECYSTECTOMY WITH INTRAOPERATIVE CHOLANGIOGRAM;  Surgeon: Donnie Mesa, MD;  Location: [place];  Service: General;  Laterality: N/A;   COLONOSCOPY WITH PROPOFOL N/A 12/05/2018   Procedure: COLONOSCOPY WITH PROPOFOL;  Surgeon: Mauri Pole, MD;  Location: [facility];  Service: Endoscopy;  Laterality: N/A;  chromoendoscopy   CORONARY STENT INTERVENTION N/A 04/18/2017   Procedure: CORONARY STENT INTERVENTION;   Surgeon: Martinique, Peter M, MD;  Location: [facility];  Service: Cardiovascular;  Laterality: N/A;   ESOPHAGOGASTRODUODENOSCOPY     EXPLORATORY LAPAROTOMY     for endometriosis   INTRAVASCULAR PRESSURE WIRE/FFR STUDY N/A 04/18/2017   Procedure: INTRAVASCULAR PRESSURE WIRE/FFR STUDY;  Surgeon: Martinique, Peter M, MD;  Location: [place] [facility];  Service: Cardiovascular;  Laterality: N/A;   LEFT HEART CATH AND CORONARY ANGIOGRAPHY N/A 04/18/2017   Procedure: LEFT HEART CATH AND CORONARY ANGIOGRAPHY;  Surgeon: Martinique, Peter M, MD;  Location: [facility];  Service: Cardiovascular;  Laterality: N/A;   POLYPECTOMY  12/05/2018   Procedure: POLYPECTOMY;  Surgeon: Mauri Pole, MD;  [facility]: [facility];  Service: Endoscopy;;   SUBMUCOSAL LIFTING INJECTION  12/05/2018   Procedure: SUBMUCOSAL LIFTING INJECTION;  Surgeon: Mauri Pole, MD;  Location: [facility];  Service: Endoscopy;;   TONSILLECTOMY AND ADENOIDECTOMY     TUBAL LIGATION     BILATERAL     OB History   No obstetric history on file.     Family History  Problem Relation Age of Onset   Lung cancer Maternal Grandmother    Stroke Father    Hypertension Mother    Heart attack Mother    Heart disease Mother    Hypertension Brother    Heart disease Brother    Hypertension Sister    Heart disease Sister    Coronary artery disease Brother    Colon cancer Neg Hx    Stomach cancer Neg Hx    Esophageal cancer Neg Hx    Rectal cancer Neg Hx     Social History   Tobacco Use   Smoking status: Former    Packs/day: 1.00    Years: 35.00    Pack years: 35.00    Types: Cigarettes    Quit date: 05/31/2004    Years since quitting: 16.7   Smokeless tobacco: Never  Vaping Use   Vaping Use: Never used  Substance Use Topics   Alcohol use: No   Drug use: No    Home Medications Prior to Admission medications   Medication Sig Start Date End Date Taking? Authorizing Provider  acetaminophen (TYLENOL) 650 MG CR  tablet Take 650 mg by mouth every 8 (eight) hours as needed for pain.   Yes [provider]  albuterol (PROVENTIL) (2.5 MG/3ML) 0.083% nebulizer solution Take 2.5 mg by nebulization every 4 (four) hours as needed for wheezing or shortness of breath.    Yes [provider]  albuterol (VENTOLIN HFA) 108 (90 Base) MCG/ACT inhaler Inhale 1-2 puffs into the lungs every 6 (six) hours as needed for wheezing or shortness of breath.   Yes [provider]  aspirin EC 81 MG tablet Take 81 mg by mouth daily. Swallow whole.   Yes [provider]  budesonide (ENTOCORT EC) 3 MG 24 hr capsule Take 3 capsules (9 mg total) by mouth daily. Patient taking differently: Take 3 mg by mouth 3 (three) times daily. 12/05/20  Yes Nandigam, Venia Minks, MD  busPIRone (BUSPAR) 10 MG tablet Take 1 tablet (10 mg total) by mouth 2 (two) times daily. 12/05/20  Yes   colestipol (COLESTID) 1 g tablet Take 1 tablet by mouth daily. 01/19/21  Yes Nandigam, Venia Minks, MD  DULoxetine (CYMBALTA) 60 MG capsule Take 1 capsule (60 mg total) by mouth daily. 01/19/21  Yes Hoyt Koch, MD  empagliflozin (JARDIANCE) 10 MG TABS tablet Take 1 tablet (10 mg total) by mouth daily before breakfast. 01/19/21  Yes Hoyt Koch, MD  enalapril (VASOTEC) 10 MG tablet Take 1 tablet (10 mg total) by mouth daily. 12/05/20  Yes Freada Bergeron, MD  EPINEPHrine 0.3 mg/0.3 mL IJ SOAJ injection Use as directed Patient taking differently: Inject 0.3 mg into the muscle as needed for anaphylaxis. 12/05/20  Yes Pemberton, Greer Ee, MD  Evolocumab (REPATHA SURECLICK) 782 MG/ML SOAJ Inject the contents of 1 pen into the skin every 14 days as directed. Patient taking differently: Inject 140 mg into the skin every 14 (fourteen) days. 12/16/20  Yes Freada Bergeron, MD  fenofibrate (TRICOR) 145 MG tablet TAKE ONE TABLET BY MOUTH ONCE DAILY 01/05/21  Yes Freada Bergeron, MD  furosemide (LASIX) 40 MG tablet Take 1 tablet  (40 mg total) by mouth daily as needed for fluid or edema. 11/27/19  Yes Dorothy Spark, MD  insulin glargine-yfgn (SEMGLEE, YFGN,) 100 UNIT/ML Pen Inject into the skin to maintain fasting blood sugar between 80 - 120. (48 UNITS per pt) Patient taking differently: Inject 48 Units into the skin daily. 12/05/20  Yes Geannie Risen, MD  levocetirizine (XYZAL) 5 MG tablet Take 1 tablet (5 mg total) by mouth every evening. 12/15/20  Yes Hoyt Koch, MD  metFORMIN (GLUCOPHAGE-XR) 500 MG 24 hr tablet Take 2 tablets (1,000 mg total) by mouth daily. 01/19/21  Yes Hoyt Koch, MD  metoprolol succinate (TOPROL-XL) 25 MG 24 hr tablet Take 1 tablet (25 mg total) by mouth daily. 12/05/20  Yes Freada Bergeron, MD  montelukast (SINGULAIR) 10 MG tablet Take 1 tablet (10 mg total) by mouth every evening. 12/15/20  Yes   Multiple Vitamins-Minerals (MULTIVITAMIN WITH MINERALS) tablet Take 1 tablet by mouth daily.   Yes [provider]  nitroGLYCERIN (NITROSTAT) 0.4 MG SL tablet Place 0.4 mg under the tongue every 5 (five) minutes as needed for chest pain.   Yes [provider]  ondansetron (ZOFRAN ODT) 4 MG disintegrating tablet Take 1 tablet (4 mg total) by mouth every 4 (four) hours as needed for nausea or vomiting. 10/18/20  Yes Pfeiffer, Jeannie Done, MD  pantoprazole (PROTONIX) 40 MG tablet Take 1 tablet (40 mg total) by mouth daily. 12/05/20  Yes Freada Bergeron, MD  Probiotic Product (PROBIOTIC DAILY PO) Take 1 capsule by mouth daily.   Yes [provider]  sodium chloride (OCEAN) 0.65 % SOLN nasal spray Place 1 spray into both nostrils daily as needed for congestion.   Yes [provider]  VASCEPA 1 g capsule Take 2 capsules by mouth 2 times daily. 01/07/21  Yes Freada Bergeron, MD  vedolizumab (ENTYVIO) 300 MG injection Inject 300 mg into the vein every 8 (eight) weeks. After the standard induction 02/21/18  Yes Nandigam, Venia Minks, MD  Vitamin D3 (VITAMIN  D) 25 MCG tablet Take 1,000 Units by mouth at bedtime.   Yes [provider]  cholestyramine light (PREVALITE) 4 g packet Take 1 packet (4 g total) by mouth 2 (two) times daily. Patient not taking: No sig reported 12/16/20   Mauri Pole, MD  cyanocobalamin (,VITAMIN B-12,) 1000 MCG/ML injection 1 ML  B12 injection every 2 weeks for 3 months then decrease to 1 B12 injection to once a month 12/16/20   Nandigam, Venia Minks, MD  cyanocobalamin (,VITAMIN B-12,) 1000 MCG/ML injection Inject 1 ml into the muscle for 1 dose, she will inject 1000 mcg once every 2 weeks for 1 month then monthly. 12/05/20   Mauri Pole, MD  DULoxetine (CYMBALTA) 30 MG capsule Take 1 capsule by mouth daily for 14 days. Patient not taking: Reported on 02/14/2021 01/23/21 02/06/21  Hoyt Koch, MD  EPIPEN 2-PAK 0.3 MG/0.3ML SOAJ injection Inject 0.3 mg into the muscle as needed for anaphylaxis. Reported on 06/27/2015 10/09/14   [provider]  insulin glargine (LANTUS) 100 UNIT/ML injection Inject 0.48 mLs (48 Units total) into the skin at bedtime. 12/15/20 12/22/20  Hoyt Koch, MD    Allergies    Lipitor [atorvastatin], Remicade [infliximab], Ticagrelor, and Rosuvastatin  Review of Systems   Review of Systems  Respiratory:  Positive for shortness of breath.   Cardiovascular:  Positive for chest pain.  All other systems reviewed and are negative.  Physical Exam Updated Vital Signs BP 128/63   Pulse 68   Temp 97.8 F (36.6 C)   Resp (!) 21   Ht 5' 2"  (1.575 m)   Wt 65.8 kg   LMP  (LMP Unknown)   SpO2 95%   BMI 26.52 kg/m   Physical Exam Vitals and nursing note reviewed.  Constitutional:      Appearance: She is well-developed.  HENT:     Head: Normocephalic and atraumatic.  Cardiovascular:     Rate and Rhythm: Normal rate and regular rhythm.     Heart sounds: No murmur heard. Pulmonary:     Effort: Pulmonary effort is normal. No respiratory distress.     Breath  sounds: Normal breath sounds.  Abdominal:     Palpations: Abdomen is soft.     Tenderness: There is no abdominal tenderness. There is no guarding or rebound.  Musculoskeletal:        General: No swelling or tenderness.     Comments: 2+ DP pulses bilaterally  Skin:    General: Skin is warm and dry.  Neurological:     Mental Status: She is alert and oriented to person, place, and time.  Psychiatric:        Behavior: Behavior normal.    ED Results / Procedures / Treatments   Labs (all labs ordered are listed, but only abnormal results are displayed) Labs Reviewed  CBC - Abnormal; Notable for the following components:      Result Value   WBC 10.7 (*)    All other components within normal limits  COMPREHENSIVE METABOLIC PANEL - Abnormal; Notable for the following components:   Glucose, Bld 153 (*)    Total Protein 6.1 (*)    Albumin 3.4 (*)    All other components within normal limits  DIFFERENTIAL - Abnormal; Notable for the following components:   Abs Immature Granulocytes 0.08 (*)    All other components within normal limits  TROPONIN I (HIGH SENSITIVITY)  TROPONIN I (HIGH SENSITIVITY)    EKG EKG Interpretation  Date/Time:  Friday February 13 2021 17:32:36 EDT Ventricular Rate:  81 PR Interval:  154 QRS Duration: 88 QT Interval:  370 QTC Calculation: 429 R Axis:   66 Text Interpretation: Normal sinus rhythm Nonspecific T wave abnormality Abnormal ECG Confirmed by Quintella Reichert [phone]) on 02/14/2021 3:12:38 AM  Radiology DG Chest 2 View  Result Date: 02/13/2021 CLINICAL DATA:  Chest pain EXAM: CHEST - 2 VIEW COMPARISON:  20/25/4270 FINDINGS: Metallic density over the neck is localized to the posterior superficial tissues on lateral view. No focal opacity or pleural effusion. Normal cardiomediastinal silhouette with aortic atherosclerosis. IMPRESSION: No active cardiopulmonary disease. Electronically Signed   By: Donavan Foil M.D.   On: 02/13/2021 18:15     Procedures Procedures   Medications Ordered in ED Medications - No data to display  ED Course  I have reviewed the triage vital signs and the nursing notes.  Pertinent labs & imaging results that were available during my care of the patient were reviewed by me and considered in my medical decision making (see chart for details).    MDM Rules/Calculators/A&P                          Patient here for evaluation of exertional chest pain with shortness of breath. She is well perfused on examination and in no acute distress. EKG without acute ischemic changes in troponins are negative times two. Presentation is not consistent with PE. Discussed with cardiologist on-call, initial plan for him to evaluate the patient in the emergency department. Due to prolonged wait times, patient is requesting discharge with outpatient cardiology follow-up. Feel that she is stable and reasonable for discharge at this time. Discussed with patient return precautions for worsening symptoms.  Final Clinical Impression(s) / ED Diagnoses Final diagnoses:  Precordial pain    Rx / DC Orders ED Discharge Orders     None        Quintella Reichert, MD 02/14/21 [phone]

## 2021-02-16 ENCOUNTER — Telehealth: Payer: Self-pay | Admitting: Cardiology

## 2021-02-16 ENCOUNTER — Telehealth: Payer: Self-pay | Admitting: *Deleted

## 2021-02-16 ENCOUNTER — Other Ambulatory Visit (HOSPITAL_COMMUNITY): Payer: Self-pay

## 2021-02-16 NOTE — Telephone Encounter (Signed)
Message Received: [name], [name] Millard, MD  Nuala Alpha, LPN Noted. Has an upcoming appt with Vella Raring NP.  [place]

## 2021-02-16 NOTE — Telephone Encounter (Signed)
Patient states she was in the ED over the weekend and was told to call the office to have more testing done, but is not sure what.

## 2021-02-16 NOTE — Telephone Encounter (Signed)
Pt called to follow-up on discharge instructions from the ER last week. Pt was scheduled her post ED visit with our establishment, for 9/28 to see Laurann Montana NP at our [facility] location.  Pt aware of this appt date, time, and location. Advised the pt to follow all discharge instructions provided to her by the ED.  Advised her to touch base with her GI MD, for her appt with them is not until 10/11, and they may be able to move that up for her. Advised the pt to call us if needed between now and her appt with [facility].  Pt verbalized understanding and agrees with this plan.

## 2021-02-18 ENCOUNTER — Other Ambulatory Visit (HOSPITAL_COMMUNITY): Payer: Self-pay

## 2021-02-25 ENCOUNTER — Encounter (HOSPITAL_BASED_OUTPATIENT_CLINIC_OR_DEPARTMENT_OTHER): Payer: Self-pay | Admitting: Family

## 2021-02-25 ENCOUNTER — Ambulatory Visit (HOSPITAL_BASED_OUTPATIENT_CLINIC_OR_DEPARTMENT_OTHER): Payer: BC Managed Care – PPO | Admitting: Family

## 2021-02-25 ENCOUNTER — Other Ambulatory Visit: Payer: Self-pay

## 2021-02-25 ENCOUNTER — Other Ambulatory Visit (HOSPITAL_COMMUNITY): Payer: Self-pay

## 2021-02-25 VITALS — BP 120/70 | HR 86 | Ht 62.0 in | Wt 140.0 lb

## 2021-02-25 DIAGNOSIS — I10 Essential (primary) hypertension: Secondary | ICD-10-CM

## 2021-02-25 DIAGNOSIS — K219 Gastro-esophageal reflux disease without esophagitis: Secondary | ICD-10-CM

## 2021-02-25 DIAGNOSIS — I25118 Atherosclerotic heart disease of native coronary artery with other forms of angina pectoris: Secondary | ICD-10-CM | POA: Diagnosis not present

## 2021-02-25 DIAGNOSIS — E785 Hyperlipidemia, unspecified: Secondary | ICD-10-CM | POA: Diagnosis not present

## 2021-02-25 MED ORDER — NITROGLYCERIN 0.4 MG SL SUBL
0.4000 mg | SUBLINGUAL_TABLET | SUBLINGUAL | 5 refills | Status: DC | PRN
  Filled 2021-02-25: qty 25, 10d supply, fill #0

## 2021-02-25 MED ORDER — METOPROLOL SUCCINATE ER 25 MG PO TB24
37.5000 mg | ORAL_TABLET | Freq: Every day | ORAL | 1 refills | Status: DC
  Filled 2021-02-25: qty 45, 30d supply, fill #0
  Filled 2021-04-02: qty 45, 30d supply, fill #1
  Filled 2021-04-30: qty 45, 30d supply, fill #2
  Filled 2021-06-19: qty 45, 30d supply, fill #3
  Filled 2021-08-03: qty 45, 30d supply, fill #4
  Filled 2021-09-11: qty 45, 30d supply, fill #5

## 2021-02-25 NOTE — Patient Instructions (Signed)
Medication Instructions:  Your physician has recommended you make the following change in your medication:   CHANGE Metoprolol to 1.5 tablets (37.27m) daily  *If you need a refill on your cardiac medications before your next appointment, please call your pharmacy*   Lab Work: None ordered today.   If you have labs (blood work) drawn today and your tests are completely normal, you will receive your results only by: [facility](if you have MyChart) OR A paper copy in the mail If you have any lab test that is abnormal or we need to change your treatment, we will call you to review the results.  Testing/Procedures: Your EKG today shows normal sinus rhythm.   Follow-Up: [facility].  As part of our continuing mission to provide you with exceptional heart care, we have created designated Provider Care Teams.  These Care Teams include your primary Cardiologist (physician) and Advanced Practice Providers (APPs -  Physician Assistants and Nurse Practitioners) who all work together to provide you with the care you need, when you need it.  We recommend signing up for the patient portal called "MyChart".  Sign up information is provided on this After Visit Summary.  MyChart is used to connect with patients for Virtual Visits (Telemedicine).  Patients are able to view lab/test results, encounter notes, upcoming appointments, etc.  Non-urgent messages can be sent to your provider as well.   To learn more about what you can do with MyChart, go to [email or web address]    Your next appointment:   3-4 month(s)  The format for your next appointment:   In Person  Provider:   You may see HFreada Bergeron MD or one of the following Advanced Practice Providers on your designated Care Team:   SRichardson Dopp PA-C Vin BEdgemere PVermontCLoel Dubonnet NP    Other Instructions  Heart Healthy Diet Recommendations: A low-salt diet is recommended.  Meats should be grilled, baked, or boiled. Avoid fried foods. Focus on lean protein sources like fish or chicken with vegetables and fruits. The American Heart Association is a GMicrobiologist    Exercise recommendations: The American Heart Association recommends 150 minutes of moderate intensity exercise weekly. Try 30 minutes of moderate intensity exercise 4-5 times per week. This could include walking, jogging, or swimming.

## 2021-02-25 NOTE — Progress Notes (Signed)
Office Visit    Patient Name: [name] Date of Encounter: 02/25/2021  PCP:  Hoyt Koch, MD   [place]  Cardiologist:  Freada Bergeron, MD  Advanced Practice Provider:  No care team member to display Electrophysiologist:  None     Chief Complaint    [name] is a 60 y.o. female with a hx of CAD s/p DES to pLAD 03/2017, hyperlipidemia, palpitations, HTN, DM2, Crohn's disease presents today for follow-up after ED visit for chest pain  Past Medical History    Past Medical History:  Diagnosis Date   Allergic rhinitis due to pollen    Allergy    Anxiety state, unspecified    Arthritis    Bronchitis    hx   CAD (coronary artery disease)    s/p NSTEMI in 11/18 treated with a DES to the proximal LAD   Calculus of gallbladder without mention of cholecystitis or obstruction    Crohn disease ([place])    Depressive disorder, not elsewhere classified    Diabetes mellitus without complication ([place])    diet controlled   Esophageal reflux    Fibromyalgia    Grade I diastolic dysfunction    Noted on ECHO   Headache    History of nuclear stress test    ETT-Myoview 10/17: EF 55%, normal perfusion; Low Risk // Nuclear stress test 05/2018:  EF 63, normal perfusion; Low Risk   Myalgia and myositis, unspecified    NSTEMI (non-ST elevated myocardial infarction) ([place]) 04/15/2017   Other and unspecified hyperlipidemia    Palpitations    Pneumonia 13   hx   S/P angioplasty with stent 04/18/17 with DES to pLAD 04/19/2017   Seizures ([place])    1 seizure as a teenager    Unspecified essential hypertension    Vitamin B deficiency    Past Surgical History:  Procedure Laterality Date   BIOPSY  12/05/2018   Procedure: BIOPSY;  Surgeon: Mauri Pole, MD;  Location: [facility];  Service: Endoscopy;;   CHOLECYSTECTOMY N/A 09/19/2014   Procedure: LAPAROSCOPIC CHOLECYSTECTOMY WITH INTRAOPERATIVE CHOLANGIOGRAM;  Surgeon: Donnie Mesa, MD;   Location: [facility];  Service: General;  Laterality: N/A;   COLONOSCOPY WITH PROPOFOL N/A 12/05/2018   Procedure: COLONOSCOPY WITH PROPOFOL;  Surgeon: Mauri Pole, MD;  Location: [facility];  Service: Endoscopy;  Laterality: N/A;  chromoendoscopy   CORONARY STENT INTERVENTION N/A 04/18/2017   Procedure: CORONARY STENT INTERVENTION;  Surgeon: Martinique, Peter M, MD;  Location: [facility];  Service: Cardiovascular;  Laterality: N/A;   ESOPHAGOGASTRODUODENOSCOPY     EXPLORATORY LAPAROTOMY     for endometriosis   INTRAVASCULAR PRESSURE WIRE/FFR STUDY N/A 04/18/2017   Procedure: INTRAVASCULAR PRESSURE WIRE/FFR STUDY;  Surgeon: Martinique, Peter M, MD;  Location: [facility];  Service: Cardiovascular;  Laterality: N/A;   LEFT HEART CATH AND CORONARY ANGIOGRAPHY N/A 04/18/2017   Procedure: LEFT HEART CATH AND CORONARY ANGIOGRAPHY;  Surgeon: Martinique, Peter M, MD;  Location: [facility];  Service: Cardiovascular;  Laterality: N/A;   POLYPECTOMY  12/05/2018   Procedure: POLYPECTOMY;  Surgeon: Mauri Pole, MD;  Location: [facility];  Service: Endoscopy;;   SUBMUCOSAL LIFTING INJECTION  12/05/2018   Procedure: SUBMUCOSAL LIFTING INJECTION;  Surgeon: Mauri Pole, MD;  Location: [facility];  Service: Endoscopy;;   TONSILLECTOMY AND ADENOIDECTOMY     TUBAL LIGATION     BILATERAL    Allergies  Allergies  Allergen Reactions  Lipitor [Atorvastatin] Other (See Comments)    Elevated ALT   Remicade [Infliximab]     REACTION: SOB   Ticagrelor     Shortness of breath   Rosuvastatin Other (See Comments)    Pt reports causes muscle aches and leg pains and feet to swell    History of Present Illness    [name] is a 60 y.o. female with a hx of CAD s/p DES to pLAD 03/2017, hyperlipidemia, palpitations, HTN, DM2, Crohn's disease last seen 08/11/2020.  Previous Myoview 07/2019 low risk without evidence of ischemia or scar.  Seen 07/14/2020 in clinic noting fluctuating  blood pressures and shortness of breath.  She followed up in hypertension clinic or home readings were thought to be unreliable due to poor technique.  She was continued on enalapril 5 mg daily and metoprolol 25 mg daily.  She was last seen in clinic 08/11/2020 with symptoms from her fibromyalgia with chronic pain and fatigue as well as exertional dyspnea.  She was undergoing clearance for EGD, Myoview was obtained 08/18/2020 was without evidence for ischemia or infarction.  ED visit 02/13/21 with chest pain below breast bone radiating to her back. Initial onset while over exertin cleaning her car per her report. HS troponin were negative. EKG with no acute ST/T wave changes.   Presents today for follow up. Notes exertional dyspnea or chest discomfort only if she overodes it. Does have an overall sedentary follow up. She has not had recurrent chest pain since ED visit. Endorses following a heart healthy diet. Reports no shortness of breath at rest and stable dyspnea on exertion with more than usual exertion. Reports no recurrent chest pain, pressure, or tightness. No edema, orthopnea, PND. Reports no palpitations.   EKGs/Labs/Other Studies Reviewed:   The following studies were reviewed today: Stress test 07/2019 Normal perfusion No ischemia or scar The left ventricular ejection fraction is normal (55-65%). There was no ST segment deviation noted during stress. This is a low risk study.   Echo 03/2017 Left ventricle: The cavity size was normal. Wall thickness was    normal. Systolic function was normal. The estimated ejection    fraction was in the range of 55% to 60%. Mild hypokinesis of the    apicalanteroseptal myocardium. Doppler parameters are consistent    with abnormal left ventricular relaxation (grade 1 diastolic    dysfunction).  - Pulmonary arteries: Systolic pressure was mildly increased. PA    peak pressure: 35 mm Hg (S).    CORONARY STENT INTERVENTION   03/2017  INTRAVASCULAR  PRESSURE WIRE/FFR STUDY  LEFT HEART CATH AND CORONARY ANGIOGRAPHY      Conclusion     Prox LAD lesion is 65% stenosed. A drug-eluting stent was successfully placed using a STENT PROMUS PREM MR 3.0X12. Post intervention, there is a 0% residual stenosis. The left ventricular systolic function is normal. LV end diastolic pressure is normal. The left ventricular ejection fraction is 50-55% by visual estimate.   1. Single vessel obstructive CAD. There is focal and somewhat hazy lesion in the proximal LAD. iFr was 0.88 with FFR of .82. This is of borderline significance but given her clinical scenario with typical chest pain, Ecg changes, and T wave abnormality along with anterior wall motion abnormality we decided to treat this lesion.  2. Overall good LV function 3. Normal LVEDP 4. Successful stenting of the proximal LAD with DES   Plan: DAPT for one year. Anticipate DC tomorrow.       EKG:  EKG is ordered today.  The ekg ordered today demonstrates NSR 86 bpm with no acute ST/T wave changes  Recent Labs: 02/13/2021: ALT 33; BUN 12; Creatinine, Ser 0.68; Hemoglobin 13.7; Platelets 305; Potassium 4.1; Sodium 137  Recent Lipid Panel    Component Value Date/Time   CHOL 105 07/14/2020 1007   TRIG 469 (H) 07/14/2020 1007   HDL 37 (L) 07/14/2020 1007   CHOLHDL 2.8 07/14/2020 1007   CHOLHDL 7 12/18/2015 1003   VLDL 53.6 (H) 08/15/2014 0734   LDLCALC 7 07/14/2020 1007   LDLDIRECT 23 07/14/2020 1007   LDLDIRECT 85.0 12/18/2015 1003    Home Medications   Current Meds  Medication Sig   acetaminophen (TYLENOL) 650 MG CR tablet Take 650 mg by mouth every 8 (eight) hours as needed for pain.   albuterol (PROVENTIL) (2.5 MG/3ML) 0.083% nebulizer solution Take 2.5 mg by nebulization every 4 (four) hours as needed for wheezing or shortness of breath.    albuterol (VENTOLIN HFA) 108 (90 Base) MCG/ACT inhaler Inhale 1-2 puffs into the lungs every 6 (six) hours as needed for wheezing or shortness  of breath.   aspirin EC 81 MG tablet Take 81 mg by mouth daily. Swallow whole.   budesonide (ENTOCORT EC) 3 MG 24 hr capsule Take 3 capsules (9 mg total) by mouth daily.   busPIRone (BUSPAR) 10 MG tablet Take 1 tablet (10 mg total) by mouth 2 (two) times daily.   cholestyramine light (PREVALITE) 4 g packet Take 1 packet (4 g total) by mouth 2 (two) times daily.   colestipol (COLESTID) 1 g tablet Take 1 tablet by mouth daily.   cyanocobalamin (,VITAMIN B-12,) 1000 MCG/ML injection 1 ML  B12 injection every 2 weeks for 3 months then decrease to 1 B12 injection to once a month   cyanocobalamin (,VITAMIN B-12,) 1000 MCG/ML injection Inject 1 ml into the muscle for 1 dose, she will inject 1000 mcg once every 2 weeks for 1 month then monthly.   DULoxetine (CYMBALTA) 30 MG capsule Take 1 capsule by mouth daily for 14 days.   DULoxetine (CYMBALTA) 60 MG capsule Take 1 capsule (60 mg total) by mouth daily.   empagliflozin (JARDIANCE) 10 MG TABS tablet Take 1 tablet (10 mg total) by mouth daily before breakfast.   enalapril (VASOTEC) 10 MG tablet Take 1 tablet (10 mg total) by mouth daily.   EPINEPHrine 0.3 mg/0.3 mL IJ SOAJ injection Use as directed   Evolocumab (REPATHA SURECLICK) 614 MG/ML SOAJ Inject the contents of 1 pen into the skin every 14 days as directed.   fenofibrate (TRICOR) 145 MG tablet TAKE ONE TABLET BY MOUTH ONCE DAILY   furosemide (LASIX) 40 MG tablet Take 1 tablet (40 mg total) by mouth daily as needed for fluid or edema.   insulin glargine-yfgn (SEMGLEE, YFGN,) 100 UNIT/ML Pen Inject into the skin to maintain fasting blood sugar between 80 - 120. (48 UNITS per pt) (Patient taking differently: Inject 48 Units into the skin daily.)   levocetirizine (XYZAL) 5 MG tablet Take 1 tablet (5 mg total) by mouth every evening.   metFORMIN (GLUCOPHAGE-XR) 500 MG 24 hr tablet Take 2 tablets (1,000 mg total) by mouth daily.   metoprolol succinate (TOPROL-XL) 25 MG 24 hr tablet Take 1 tablet (25 mg  total) by mouth daily.   montelukast (SINGULAIR) 10 MG tablet Take 1 tablet (10 mg total) by mouth every evening.   Multiple Vitamins-Minerals (MULTIVITAMIN WITH MINERALS) tablet Take 1 tablet by mouth daily.  nitroGLYCERIN (NITROSTAT) 0.4 MG SL tablet Place 0.4 mg under the tongue every 5 (five) minutes as needed for chest pain.   ondansetron (ZOFRAN ODT) 4 MG disintegrating tablet Take 1 tablet (4 mg total) by mouth every 4 (four) hours as needed for nausea or vomiting.   pantoprazole (PROTONIX) 40 MG tablet Take 1 tablet (40 mg total) by mouth daily.   Probiotic Product (PROBIOTIC DAILY PO) Take 1 capsule by mouth daily.   sodium chloride (OCEAN) 0.65 % SOLN nasal spray Place 1 spray into both nostrils daily as needed for congestion.   VASCEPA 1 g capsule Take 2 capsules by mouth 2 times daily.   vedolizumab (ENTYVIO) 300 MG injection Inject 300 mg into the vein every 8 (eight) weeks. After the standard induction   Vitamin D3 (VITAMIN D) 25 MCG tablet Take 1,000 Units by mouth at bedtime.     Review of Systems      All other systems reviewed and are otherwise negative except as noted above.  Physical Exam    VS:  BP 120/70 (BP Location: Left Arm, Patient Position: Sitting, Cuff Size: Normal)   Pulse 86   Ht 5' 2"  (1.575 m)   Wt 140 lb (63.5 kg)   LMP  (LMP Unknown)   SpO2 95%   BMI 25.61 kg/m  , BMI Body mass index is 25.61 kg/m.  Wt Readings from Last 3 Encounters:  02/25/21 140 lb (63.5 kg)  02/13/21 145 lb (65.8 kg)  01/19/21 136 lb 9.6 oz (62 kg)     GEN: Well nourished, well developed, in no acute distress. HEENT: normal. Neck: Supple, no JVD, carotid bruits, or masses. Cardiac: RRR, no murmurs, rubs, or gallops. No clubbing, cyanosis, edema.  Radials/PT 2+ and equal bilaterally.  Respiratory:  Respirations regular and unlabored, clear to auscultation bilaterally. GI: Soft, nontender, nondistended. MS: No deformity or atrophy. Skin: Warm and dry, no rash. Neuro:   Strength and sensation are intact. Psych: Normal affect.  Assessment & Plan    CAD s/p DES to pLAD 2018- ED visit with chest pain occurring at rest and troponin negative. EKG with no acute changes. No indication for ischemic evaluation at this time. Given intermittent chest pain for additional antianginal benefit will increase Toprol to 37.22m QD. Offered Imdur but she prefers to avoid additional medications. I do think some of her exercise intolerance is related to deconditioning and exercise regimen encouraged. [facility]. GDMT includes aspirin, Repatha, metoprolol. Heart healthy diet and regular cardiovascular exercise encouraged.    HTN-BP well controlled. Continue current antihypertensive regimen.    GERD - Continue Protonix 440mQD.   DM2 - Appreciate inclusion of Jardiance for cardioprotective benefit. Continue to follow with PCP.   HLD, LDL goal <70 - Continue Repatha.   Disposition: Follow up in 3-4 month(s) with Dr. PeJohney Framer APP.  Signed, CaLoel DubonnetNP 02/25/2021, 3:59 PM [place]

## 2021-02-27 ENCOUNTER — Telehealth: Payer: Self-pay | Admitting: Internal Medicine

## 2021-02-27 ENCOUNTER — Other Ambulatory Visit (HOSPITAL_COMMUNITY): Payer: Self-pay

## 2021-02-27 MED ORDER — LEVOCETIRIZINE DIHYDROCHLORIDE 5 MG PO TABS
5.0000 mg | ORAL_TABLET | Freq: Every evening | ORAL | 0 refills | Status: DC
  Filled 2021-02-27: qty 30, 30d supply, fill #0
  Filled 2021-03-26: qty 30, 30d supply, fill #1
  Filled 2021-04-25: qty 30, 30d supply, fill #2

## 2021-02-27 NOTE — Telephone Encounter (Signed)
Refill has been sent to the patient's pharmacy.

## 2021-02-27 NOTE — Telephone Encounter (Signed)
Patient requesting refill for  levocetirizine (XYZAL) 5 MG tablet  Pharmacy [facility]

## 2021-03-02 ENCOUNTER — Other Ambulatory Visit (HOSPITAL_COMMUNITY): Payer: Self-pay

## 2021-03-03 ENCOUNTER — Other Ambulatory Visit (HOSPITAL_COMMUNITY): Payer: Self-pay

## 2021-03-05 ENCOUNTER — Other Ambulatory Visit (HOSPITAL_COMMUNITY): Payer: Self-pay

## 2021-03-06 ENCOUNTER — Other Ambulatory Visit: Payer: Self-pay | Admitting: Internal Medicine

## 2021-03-06 ENCOUNTER — Other Ambulatory Visit (HOSPITAL_COMMUNITY): Payer: Self-pay

## 2021-03-06 ENCOUNTER — Telehealth: Payer: Self-pay | Admitting: Gastroenterology

## 2021-03-06 MED ORDER — PANTOPRAZOLE SODIUM 40 MG PO TBEC
40.0000 mg | DELAYED_RELEASE_TABLET | Freq: Every day | ORAL | 0 refills | Status: DC
  Filled 2021-03-06: qty 30, 30d supply, fill #0
  Filled 2021-03-26: qty 30, 30d supply, fill #1
  Filled 2021-04-30: qty 30, 30d supply, fill #2
  Filled 2021-06-06: qty 30, 30d supply, fill #3
  Filled 2021-07-16: qty 30, 30d supply, fill #4
  Filled 2021-08-12: qty 30, 30d supply, fill #5
  Filled 2021-09-11: qty 30, 30d supply, fill #6
  Filled 2021-10-15: qty 30, 30d supply, fill #7

## 2021-03-06 NOTE — Telephone Encounter (Signed)
   Patient requesting order for True Metrix glucometer/ test strips/ lancets  Pharmacy [facility]

## 2021-03-06 NOTE — Telephone Encounter (Signed)
Pt needs rf for acid reflux to [facility] outpt pharmacy. She did not remember the name of the med.

## 2021-03-06 NOTE — Telephone Encounter (Signed)
RX sent

## 2021-03-09 NOTE — Telephone Encounter (Signed)
Okay for glucometer and supplies testing up to TID prn

## 2021-03-09 NOTE — Telephone Encounter (Signed)
See below

## 2021-03-10 ENCOUNTER — Other Ambulatory Visit (HOSPITAL_COMMUNITY): Payer: Self-pay

## 2021-03-10 ENCOUNTER — Encounter: Payer: Self-pay | Admitting: Gastroenterology

## 2021-03-10 ENCOUNTER — Ambulatory Visit (INDEPENDENT_AMBULATORY_CARE_PROVIDER_SITE_OTHER): Payer: BC Managed Care – PPO | Admitting: Gastroenterology

## 2021-03-10 VITALS — BP 124/70 | HR 79 | Ht 62.0 in | Wt 138.0 lb

## 2021-03-10 DIAGNOSIS — R7989 Other specified abnormal findings of blood chemistry: Secondary | ICD-10-CM

## 2021-03-10 DIAGNOSIS — K9089 Other intestinal malabsorption: Secondary | ICD-10-CM

## 2021-03-10 DIAGNOSIS — K5080 Crohn's disease of both small and large intestine without complications: Secondary | ICD-10-CM | POA: Diagnosis not present

## 2021-03-10 DIAGNOSIS — K580 Irritable bowel syndrome with diarrhea: Secondary | ICD-10-CM | POA: Diagnosis not present

## 2021-03-10 DIAGNOSIS — K529 Noninfective gastroenteritis and colitis, unspecified: Secondary | ICD-10-CM | POA: Diagnosis not present

## 2021-03-10 DIAGNOSIS — K219 Gastro-esophageal reflux disease without esophagitis: Secondary | ICD-10-CM

## 2021-03-10 DIAGNOSIS — Z23 Encounter for immunization: Secondary | ICD-10-CM | POA: Diagnosis not present

## 2021-03-10 MED ORDER — FREESTYLE LANCETS MISC
0 refills | Status: DC
  Filled 2021-03-10: qty 100, 25d supply, fill #0

## 2021-03-10 MED ORDER — GLUCOSE BLOOD VI STRP
ORAL_STRIP | 0 refills | Status: DC
  Filled 2021-03-10 (×5): qty 100, 25d supply, fill #0

## 2021-03-10 MED ORDER — FREESTYLE LITE W/DEVICE KIT
PACK | 0 refills | Status: AC
  Filled 2021-03-10 (×2): qty 1, 30d supply, fill #0

## 2021-03-10 MED ORDER — TRUE METRIX BLOOD GLUCOSE TEST VI STRP
ORAL_STRIP | 0 refills | Status: DC
  Filled 2021-03-10 (×2): qty 100, 25d supply, fill #0

## 2021-03-10 NOTE — Telephone Encounter (Signed)
Okay to send in 100 strips and lancets with 11 refills, will need separate orders for those as the meter kit only comes with samples of the strips and lancets

## 2021-03-10 NOTE — Progress Notes (Signed)
[name]    [id number]    08/22/1960  Primary Care Physician:Crawford, Real Cons, MD  Referring Physician: Hoyt Koch, MD [address] [place],  [place] [address]   Chief complaint:  Crohn's disease  HPI:  60 year old very pleasant female with history of Crohn's disease on Entyvio for maintenance therapy here for follow-up visit for crohn's disease   LFT are trending down to baseline, almost normal based on labs February 13, 2021   Diarrhea is  better with Prevalite  Denies any rectal bleeding, abdominal pain, vomiting, melena, dysphagia or unintentional weight loss   Colonoscopy December 05, 2018: Terminal ileal biopsies consistent with Crohn's disease.  IC valve polypoid lesion status post EMR, biopsies consistent with IBD negative for dysplasia or adenomatous tissue 5 mm polyp, tubular adenoma removed from transverse colon otherwise normal colon on chromoendoscopy   Colonoscopy July 07, 2018 Nodular TI biopsied Localized nodular polypoid mucosa in IC valve biopsied, ?  Adenoma on pathology report, requested additional review is pending.   EGD 08/22/2009: Esophagitis otherwise unremarkable exam      MRI pelvis February 14, 2018 showed grade 1 simple linear intersphincteric perianal fistula without associated abscess.  Mild segmental wall thickening and luminal narrowing in the distal ileum suggestive of mild active Crohn's ileitis.  Mild chronic wall thickening in the terminal ileum without active inflammatory changes.   Relevant GI history: Crohn's disease with predominant small bowel involvement diagnosed in 1991.  Initially was managed with prednisone, had significant side effects.  Subsequently was in clinical remission on 6-MP.  6-MP was discontinued in 2017 due to persistently elevated transaminases, have improved after discontinuing 6-MP.  She was started on Humira, clinically failed with persistent symptoms.  Had undetectable drug trough  with elevated antibodies.  Started on budesonide 9 mg daily and taper down to 3 mg daily in 2018.  Developed rectal pain and noted to have perianal fistula on MRI pelvis September 2019.   Relevant other PMH She had NSTEMI in November 2018, status post coronary stent placement (DES)   Outpatient Encounter Medications as of 03/10/2021  Medication Sig   acetaminophen (TYLENOL) 650 MG CR tablet Take 650 mg by mouth every 8 (eight) hours as needed for pain.   albuterol (PROVENTIL) (2.5 MG/3ML) 0.083% nebulizer solution Take 2.5 mg by nebulization every 4 (four) hours as needed for wheezing or shortness of breath.    albuterol (VENTOLIN HFA) 108 (90 Base) MCG/ACT inhaler Inhale 1-2 puffs into the lungs every 6 (six) hours as needed for wheezing or shortness of breath.   aspirin EC 81 MG tablet Take 81 mg by mouth daily. Swallow whole.   Blood Glucose Monitoring Suppl (FREESTYLE LITE) w/Device KIT Use to check blood sugar up to 4 times a day as directed   budesonide (ENTOCORT EC) 3 MG 24 hr capsule Take 3 capsules (9 mg total) by mouth daily.   busPIRone (BUSPAR) 10 MG tablet Take 1 tablet (10 mg total) by mouth 2 (two) times daily.   cholestyramine light (PREVALITE) 4 g packet Take 1 packet (4 g total) by mouth 2 (two) times daily.   colestipol (COLESTID) 1 g tablet Take 1 tablet by mouth daily.   cyanocobalamin (,VITAMIN B-12,) 1000 MCG/ML injection 1 ML  B12 injection every 2 weeks for 3 months then decrease to 1 B12 injection to once a month   cyanocobalamin (,VITAMIN B-12,) 1000 MCG/ML injection Inject 1 ml into the muscle  for 1 dose, she will inject 1000 mcg once every 2 weeks for 1 month then monthly.   DULoxetine (CYMBALTA) 60 MG capsule Take 1 capsule (60 mg total) by mouth daily.   empagliflozin (JARDIANCE) 10 MG TABS tablet Take 1 tablet (10 mg total) by mouth daily before breakfast.   enalapril (VASOTEC) 10 MG tablet Take 1 tablet (10 mg total) by mouth daily.   EPINEPHrine 0.3 mg/0.3 mL  IJ SOAJ injection Use as directed   Evolocumab (REPATHA SURECLICK) 350 MG/ML SOAJ Inject the contents of 1 pen into the skin every 14 days as directed.   fenofibrate (TRICOR) 145 MG tablet TAKE ONE TABLET BY MOUTH ONCE DAILY   furosemide (LASIX) 40 MG tablet Take 1 tablet (40 mg total) by mouth daily as needed for fluid or edema.   glucose blood test strip Use to check blood sugar up to 4 times a day as directed   insulin glargine-yfgn (SEMGLEE, YFGN,) 100 UNIT/ML Pen Inject into the skin to maintain fasting blood sugar between 80 - 120. (48 UNITS per pt) (Patient taking differently: Inject 48 Units into the skin daily.)   Lancets (FREESTYLE) lancets Use to check blood sugar up to 4 times a day as directed   levocetirizine (XYZAL) 5 MG tablet Take 1 tablet (5 mg total) by mouth every evening.   metFORMIN (GLUCOPHAGE-XR) 500 MG 24 hr tablet Take 2 tablets (1,000 mg total) by mouth daily.   metoprolol succinate (TOPROL-XL) 25 MG 24 hr tablet Take 1 & 1/2 tablets (37.5 mg total) by mouth daily.   montelukast (SINGULAIR) 10 MG tablet Take 1 tablet (10 mg total) by mouth every evening.   Multiple Vitamins-Minerals (MULTIVITAMIN WITH MINERALS) tablet Take 1 tablet by mouth daily.   nitroGLYCERIN (NITROSTAT) 0.4 MG SL tablet Place 1 tablet (0.4 mg total) under the tongue every 5 (five) minutes as needed for chest pain.  Call 911 if no relief after 1st dose.   ondansetron (ZOFRAN ODT) 4 MG disintegrating tablet Take 1 tablet (4 mg total) by mouth every 4 (four) hours as needed for nausea or vomiting.   pantoprazole (PROTONIX) 40 MG tablet Take 1 tablet (40 mg total) by mouth daily.   Probiotic Product (PROBIOTIC DAILY PO) Take 1 capsule by mouth daily.   sodium chloride (OCEAN) 0.65 % SOLN nasal spray Place 1 spray into both nostrils daily as needed for congestion.   VASCEPA 1 g capsule Take 2 capsules by mouth 2 times daily.   vedolizumab (ENTYVIO) 300 MG injection Inject 300 mg into the vein every 8  (eight) weeks. After the standard induction   Vitamin D3 (VITAMIN D) 25 MCG tablet Take 1,000 Units by mouth at bedtime.   DULoxetine (CYMBALTA) 30 MG capsule Take 1 capsule by mouth daily for 14 days.   [DISCONTINUED] EPIPEN 2-PAK 0.3 MG/0.3ML SOAJ injection Inject 0.3 mg into the muscle as needed for anaphylaxis. Reported on 06/27/2015   [DISCONTINUED] insulin glargine (LANTUS) 100 UNIT/ML injection Inject 0.48 mLs (48 Units total) into the skin at bedtime.   No facility-administered encounter medications on file as of 03/10/2021.    Allergies as of 03/10/2021 - Review Complete 03/10/2021  Allergen Reaction Noted   Lipitor [atorvastatin] Other (See Comments) 03/28/2013   Remicade [infliximab]     Ticagrelor  09/16/2017   Rosuvastatin Other (See Comments) 01/25/2019    Past Medical History:  Diagnosis Date   Allergic rhinitis due to pollen    Allergy    Anxiety state, unspecified  Arthritis    Bronchitis    hx   CAD (coronary artery disease)    s/p NSTEMI in 11/18 treated with a DES to the proximal LAD   Calculus of gallbladder without mention of cholecystitis or obstruction    Crohn disease ([place])    Depressive disorder, not elsewhere classified    Diabetes mellitus without complication ([place])    diet controlled   Esophageal reflux    Fibromyalgia    Grade I diastolic dysfunction    Noted on ECHO   Headache    History of nuclear stress test    ETT-Myoview 10/17: EF 55%, normal perfusion; Low Risk // Nuclear stress test 05/2018:  EF 63, normal perfusion; Low Risk   Myalgia and myositis, unspecified    NSTEMI (non-ST elevated myocardial infarction) ([place]) 04/15/2017   Other and unspecified hyperlipidemia    Palpitations    Pneumonia 13   hx   S/P angioplasty with stent 04/18/17 with DES to pLAD 04/19/2017   Seizures ([place])    1 seizure as a teenager    Unspecified essential hypertension    Vitamin B deficiency     Past Surgical History:  Procedure Laterality Date    BIOPSY  12/05/2018   Procedure: BIOPSY;  Surgeon: Mauri Pole, MD;  Location: [facility];  Service: Endoscopy;;   CHOLECYSTECTOMY N/A 09/19/2014   Procedure: LAPAROSCOPIC CHOLECYSTECTOMY WITH INTRAOPERATIVE CHOLANGIOGRAM;  Surgeon: Donnie Mesa, MD;  Location: [facility];  Service: General;  Laterality: N/A;   COLONOSCOPY WITH PROPOFOL N/A 12/05/2018   Procedure: COLONOSCOPY WITH PROPOFOL;  Surgeon: Mauri Pole, MD;  Location: [facility];  Service: Endoscopy;  Laterality: N/A;  chromoendoscopy   CORONARY STENT INTERVENTION N/A 04/18/2017   Procedure: CORONARY STENT INTERVENTION;  Surgeon: Martinique, Peter M, MD;  Location: [facility];  Service: Cardiovascular;  Laterality: N/A;   ESOPHAGOGASTRODUODENOSCOPY     EXPLORATORY LAPAROTOMY     for endometriosis   INTRAVASCULAR PRESSURE WIRE/FFR STUDY N/A 04/18/2017   Procedure: INTRAVASCULAR PRESSURE WIRE/FFR STUDY;  Surgeon: Martinique, Peter M, MD;  Location: [facility];  Service: Cardiovascular;  Laterality: N/A;   LEFT HEART CATH AND CORONARY ANGIOGRAPHY N/A 04/18/2017   Procedure: LEFT HEART CATH AND CORONARY ANGIOGRAPHY;  Surgeon: Martinique, Peter M, MD;  Location: [facility];  Service: Cardiovascular;  Laterality: N/A;   POLYPECTOMY  12/05/2018   Procedure: POLYPECTOMY;  Surgeon: Mauri Pole, MD;  Location: [facility];  Service: Endoscopy;;   SUBMUCOSAL LIFTING INJECTION  12/05/2018   Procedure: SUBMUCOSAL LIFTING INJECTION;  Surgeon: Mauri Pole, MD;  Location: [facility];  Service: Endoscopy;;   TONSILLECTOMY AND ADENOIDECTOMY     TUBAL LIGATION     BILATERAL    Family History  Problem Relation Age of Onset   Lung cancer Maternal Grandmother    Stroke Father    Hypertension Mother    Heart attack Mother    Heart disease Mother    Hypertension Brother    Heart disease Brother    Hypertension Sister    Heart disease Sister    Coronary artery disease Brother    Colon cancer Neg Hx     Stomach cancer Neg Hx    Esophageal cancer Neg Hx    Rectal cancer Neg Hx     Social History   Socioeconomic History   Marital status: Married    Spouse name: Dane   Number of children: 2   Years of education: 12+   Highest education  level: Not on file  Occupational History   Occupation: ACCOUNT REP.    Employer: [employer]  Tobacco Use   Smoking status: Former    Packs/day: 1.00    Years: 35.00    Pack years: 35.00    Types: Cigarettes    Quit date: 05/31/2004    Years since quitting: 16.7   Smokeless tobacco: Never  Vaping Use   Vaping Use: Never used  Substance and Sexual Activity   Alcohol use: No   Drug use: No   Sexual activity: Not Currently    Comment: married  Other Topics Concern   Not on file  Social History Narrative   Lives with her husband and their pets. Her children are adults and live independently.   Social Determinants of Health   Financial Resource Strain: Not on file  Food Insecurity: Not on file  Transportation Needs: Not on file  Physical Activity: Not on file  Stress: Not on file  Social Connections: Not on file  Intimate Partner Violence: Not on file      Review of systems: All other review of systems negative except as mentioned in the HPI.   Physical Exam: Vitals:   03/10/21 1037  BP: 124/70  Pulse: 79  SpO2: 99%   Body mass index is 25.24 kg/m. Gen:      No acute distress HEENT:  sclera anicteric Neuro: alert and oriented x 3 Psych: normal mood and affect  Data Reviewed:  Reviewed labs, radiology imaging, old records and pertinent past GI work up   Assessment and Plan/Recommendations:  60 year old very pleasant female with history of Crohn's disease, diabetes and CAD here for follow up visit   Bile salt induced diarrhea: Continue Prevalite   Crohn's disease: Continue Entyvio every 8 weeks for maintenance   Abnormal LFT with elevated transaminases likely secondary to acute viral syndrome, LFTs  have trended down to baseline.    Vitamin B12 deficiency: Continue B12 injections    GERD: Symptoms stable continue daily Protonix and antireflux measures  Due for surveillance colonoscopy 2023, will schedule it at next follow-up visit.  IBD health maintenance: Due for flu vaccine   Return in 4 months for follow-up visit or sooner if needed   The patient was provided an opportunity to ask questions and all were answered. The patient agreed with the plan and demonstrated an understanding of the instructions.  Damaris Hippo , MD    CC: Hoyt Koch, *

## 2021-03-10 NOTE — Patient Instructions (Signed)
You was given a FLU Shot today  Follow up in 4 months  Due to recent changes in healthcare laws, you may see the results of your imaging and laboratory studies on MyChart before your provider has had a chance to review them.  We understand that in some cases there may be results that are confusing or concerning to you. Not all laboratory results come back in the same time frame and the provider may be waiting for multiple results in order to interpret others.  Please give [place] 48 hours in order for your provider to thoroughly review all the results before contacting the office for clarification of your results.    If you are age 68 or older, your body mass index should be between 23-30. Your Body mass index is 25.24 kg/m. If this is out of the aforementioned range listed, please consider follow up with your Primary Care Provider.  If you are age 61 or younger, your body mass index should be between 19-25. Your Body mass index is 25.24 kg/m. If this is out of the aformentioned range listed, please consider follow up with your Primary Care Provider.   __________________________________________________________  The [facility] providers would like to encourage you to use [facility] to communicate with providers for non-urgent requests or questions.  Due to long hold times on the telephone, sending your provider a message by [facility] may be a faster and more efficient way to get a response.  Please allow 48 business hours for a response.  Please remember that this is for non-urgent requests.    I appreciate the  opportunity to care for you  Thank You   Harl Bowie , MD

## 2021-03-11 ENCOUNTER — Other Ambulatory Visit (HOSPITAL_COMMUNITY): Payer: Self-pay

## 2021-03-12 ENCOUNTER — Other Ambulatory Visit (HOSPITAL_COMMUNITY): Payer: Self-pay

## 2021-03-12 ENCOUNTER — Other Ambulatory Visit: Payer: Self-pay | Admitting: Internal Medicine

## 2021-03-12 MED ORDER — "PEN NEEDLES 3/16"" 31G X 5 MM MISC"
11 refills | Status: DC
  Filled 2021-03-12: qty 100, 30d supply, fill #0
  Filled 2021-06-19: qty 100, 30d supply, fill #1
  Filled 2021-09-17: qty 100, 30d supply, fill #2
  Filled 2022-01-19: qty 100, 30d supply, fill #3

## 2021-03-17 ENCOUNTER — Other Ambulatory Visit (HOSPITAL_COMMUNITY): Payer: Self-pay

## 2021-03-18 ENCOUNTER — Other Ambulatory Visit (HOSPITAL_COMMUNITY): Payer: Self-pay

## 2021-03-24 ENCOUNTER — Other Ambulatory Visit (HOSPITAL_COMMUNITY): Payer: Self-pay

## 2021-03-26 ENCOUNTER — Other Ambulatory Visit (HOSPITAL_COMMUNITY): Payer: Self-pay

## 2021-03-31 ENCOUNTER — Other Ambulatory Visit (HOSPITAL_COMMUNITY): Payer: Self-pay

## 2021-03-31 DIAGNOSIS — M25512 Pain in left shoulder: Secondary | ICD-10-CM | POA: Diagnosis not present

## 2021-03-31 DIAGNOSIS — K50813 Crohn's disease of both small and large intestine with fistula: Secondary | ICD-10-CM | POA: Diagnosis not present

## 2021-03-31 DIAGNOSIS — M25511 Pain in right shoulder: Secondary | ICD-10-CM | POA: Diagnosis not present

## 2021-04-02 ENCOUNTER — Ambulatory Visit: Payer: BC Managed Care – PPO | Admitting: Internal Medicine

## 2021-04-02 ENCOUNTER — Other Ambulatory Visit (HOSPITAL_COMMUNITY): Payer: Self-pay

## 2021-04-03 ENCOUNTER — Ambulatory Visit: Payer: BC Managed Care – PPO | Admitting: Internal Medicine

## 2021-04-03 ENCOUNTER — Other Ambulatory Visit: Payer: Self-pay

## 2021-04-03 ENCOUNTER — Other Ambulatory Visit (HOSPITAL_COMMUNITY): Payer: Self-pay

## 2021-04-03 ENCOUNTER — Encounter: Payer: Self-pay | Admitting: Internal Medicine

## 2021-04-03 VITALS — BP 124/70 | HR 85 | Resp 18 | Ht 62.0 in | Wt 140.0 lb

## 2021-04-03 DIAGNOSIS — E118 Type 2 diabetes mellitus with unspecified complications: Secondary | ICD-10-CM

## 2021-04-03 MED ORDER — FREESTYLE LIBRE SENSOR SYSTEM MISC
0 refills | Status: AC
  Filled 2021-04-03: qty 1, fill #0

## 2021-04-03 MED ORDER — FREESTYLE LIBRE 14 DAY SENSOR MISC
11 refills | Status: DC
  Filled 2021-04-03: qty 2, 28d supply, fill #0

## 2021-04-03 MED ORDER — FREESTYLE LIBRE 14 DAY READER DEVI
0 refills | Status: DC
  Filled 2021-04-03: qty 1, 30d supply, fill #0
  Filled 2021-04-06: qty 1, 1d supply, fill #0

## 2021-04-03 NOTE — Progress Notes (Signed)
   Subjective:   Patient ID: [name], female    DOB: November 06, 1960, 60 y.o.   MRN: [id number]  Diabetes Pertinent negatives for diabetes include no chest pain.  The patient is a 59 YO female coming in for possible continuous glucose monitoring. Some new shoulder pain. Got x-ray normal could not afford MRI currently.   Review of Systems  Constitutional: Negative.   HENT: Negative.    Eyes: Negative.   Respiratory:  Negative for cough, chest tightness and shortness of breath.   Cardiovascular:  Negative for chest pain, palpitations and leg swelling.  Gastrointestinal:  Negative for abdominal distention, abdominal pain, constipation, diarrhea, nausea and vomiting.  Musculoskeletal:  Positive for arthralgias.  Skin: Negative.   Neurological: Negative.   Psychiatric/Behavioral: Negative.     Objective:  Physical Exam Constitutional:      Appearance: She is well-developed.  HENT:     Head: Normocephalic and atraumatic.  Cardiovascular:     Rate and Rhythm: Normal rate and regular rhythm.  Pulmonary:     Effort: Pulmonary effort is normal. No respiratory distress.     Breath sounds: Normal breath sounds. No wheezing or rales.  Abdominal:     General: Bowel sounds are normal. There is no distension.     Palpations: Abdomen is soft.     Tenderness: There is no abdominal tenderness. There is no rebound.  Musculoskeletal:        General: Tenderness present.     Cervical back: Normal range of motion.  Skin:    General: Skin is warm and dry.  Neurological:     Mental Status: She is alert and oriented to person, place, and time.     Coordination: Coordination normal.    Vitals:   04/03/21 1029  BP: 124/70  Pulse: 85  Resp: 18  SpO2: 96%  Weight: 140 lb (63.5 kg)  Height: 5' 2"  (1.575 m)    This visit occurred during the SARS-CoV-2 public health emergency.  Safety protocols were in place, including screening questions prior to the visit, additional usage of staff PPE, and  extensive cleaning of exam room while observing appropriate contact time as indicated for disinfecting solutions.   Assessment & Plan:

## 2021-04-03 NOTE — Patient Instructions (Addendum)
We will have you come back in 1-2 months for the blood work to check the sugars and we will get the meter sent in to monitor the sugars.   You can increase the daily insulin by 2 units every 4 days until the morning sugar is less than 150 consistently. Let us know how much you are taking so we can update the prescription when needed so you get enough.

## 2021-04-03 NOTE — Assessment & Plan Note (Signed)
Ordered freestyle libre and we talked about likely need for PA and then depending on coverage she may pursue this. She is currently taking long acting semglee 48 units daily and having high morning sugars. Advised to increase by 2 units every 4 days until morning sugar consistently <150. Continue metformin and jardiance. She is still on budesonide which likely is elevating her sugars. Asked her to return for Hga1c in 1-2 months for check as she is not due today for this. Adjust as needed after labs.

## 2021-04-06 ENCOUNTER — Other Ambulatory Visit (HOSPITAL_COMMUNITY): Payer: Self-pay

## 2021-04-20 ENCOUNTER — Other Ambulatory Visit (HOSPITAL_COMMUNITY): Payer: Self-pay

## 2021-04-25 ENCOUNTER — Other Ambulatory Visit (HOSPITAL_COMMUNITY): Payer: Self-pay

## 2021-04-27 ENCOUNTER — Other Ambulatory Visit (HOSPITAL_COMMUNITY): Payer: Self-pay

## 2021-04-28 ENCOUNTER — Other Ambulatory Visit (HOSPITAL_COMMUNITY): Payer: Self-pay

## 2021-04-30 ENCOUNTER — Other Ambulatory Visit: Payer: Self-pay | Admitting: Internal Medicine

## 2021-04-30 ENCOUNTER — Other Ambulatory Visit (HOSPITAL_COMMUNITY): Payer: Self-pay

## 2021-05-01 ENCOUNTER — Other Ambulatory Visit (HOSPITAL_COMMUNITY): Payer: Self-pay

## 2021-05-01 MED ORDER — MONTELUKAST SODIUM 10 MG PO TABS
10.0000 mg | ORAL_TABLET | Freq: Every day | ORAL | 3 refills | Status: DC
  Filled 2021-05-01: qty 30, 30d supply, fill #0
  Filled 2021-06-06: qty 30, 30d supply, fill #1
  Filled 2021-07-07: qty 30, 30d supply, fill #2
  Filled 2021-08-03: qty 30, 30d supply, fill #3
  Filled 2021-09-11: qty 30, 30d supply, fill #4
  Filled 2021-10-15: qty 30, 30d supply, fill #5
  Filled 2021-11-19: qty 30, 30d supply, fill #6
  Filled 2021-12-14: qty 30, 30d supply, fill #7
  Filled 2022-01-19: qty 30, 30d supply, fill #8
  Filled 2022-02-24: qty 30, 30d supply, fill #9
  Filled 2022-03-22: qty 30, 30d supply, fill #10
  Filled 2022-04-23: qty 30, 30d supply, fill #11

## 2021-05-14 ENCOUNTER — Other Ambulatory Visit (HOSPITAL_COMMUNITY): Payer: Self-pay

## 2021-05-16 ENCOUNTER — Other Ambulatory Visit (HOSPITAL_COMMUNITY): Payer: Self-pay

## 2021-05-18 ENCOUNTER — Other Ambulatory Visit (HOSPITAL_COMMUNITY): Payer: Self-pay

## 2021-05-18 ENCOUNTER — Telehealth: Payer: Self-pay | Admitting: Gastroenterology

## 2021-05-18 NOTE — Telephone Encounter (Signed)
Inbound call from patient, she is wanting to see if she can get a urine test. Patient states she is in a little pain. Seeking advice. Please advise.  [phone]

## 2021-05-18 NOTE — Telephone Encounter (Signed)
Called the patient. She is in a store. I will call her back in 10 minutes.

## 2021-05-19 NOTE — Telephone Encounter (Signed)
Late entry Called the patient again. No answer. Voicemail is full and cannot accept messages. This was at 4:15 yesterday 05/18/21.

## 2021-05-21 ENCOUNTER — Other Ambulatory Visit (HOSPITAL_COMMUNITY): Payer: Self-pay

## 2021-05-26 DIAGNOSIS — K50813 Crohn's disease of both small and large intestine with fistula: Secondary | ICD-10-CM | POA: Diagnosis not present

## 2021-05-26 DIAGNOSIS — Z79899 Other long term (current) drug therapy: Secondary | ICD-10-CM | POA: Diagnosis not present

## 2021-06-02 ENCOUNTER — Other Ambulatory Visit (HOSPITAL_COMMUNITY): Payer: Self-pay

## 2021-06-02 ENCOUNTER — Other Ambulatory Visit: Payer: Self-pay

## 2021-06-02 ENCOUNTER — Telehealth: Payer: Self-pay | Admitting: Gastroenterology

## 2021-06-02 MED ORDER — DICYCLOMINE HCL 20 MG PO TABS
20.0000 mg | ORAL_TABLET | Freq: Four times a day (QID) | ORAL | 0 refills | Status: AC
  Filled 2021-06-02: qty 60, 15d supply, fill #0

## 2021-06-02 NOTE — Telephone Encounter (Signed)
Doc of Day Patient of Dr Silverio Decamp calling with complaints of sharp intermittent mid-abdominal pain. "Pain doubles me over and I have to lie down with a pillow squeezed to my belly." Diarrhea with this pain. She has had 3 diarrhea stools already today. Pain occurs independent of eating. Nauseated. No vomiting.  Patient has Crohn's of small and large intestine. She is on Entyvio every 8 weeks. Last infusion 05/26/21. Diabetic on insulin. Please advise.

## 2021-06-02 NOTE — Telephone Encounter (Signed)
She can try dicyclomine 20 mg every 6 hrs as needed # 60 no refill  Recommend a clear liquid diet and advance as tolerated also  If she gets severe problems, persistent vomiting, failure to resolve call back or go to ED if it very bad  My thought are could be a flare of Crohn's but could also be a transient virus or other diarrheal illness.

## 2021-06-02 NOTE — Telephone Encounter (Signed)
Patient called stating she was having severe, sharp, stabbing pains in the mid section of her stomach with diarrhea. She didn't know if there may be something she could take to help alleviate it or not.  Please call and advise.  Thank you.

## 2021-06-02 NOTE — Telephone Encounter (Signed)
Discussed with the patient. Agrees to this plan of care. Appointment scheduled for next week. Will cancel it if she improves.

## 2021-06-06 ENCOUNTER — Other Ambulatory Visit: Payer: Self-pay | Admitting: Internal Medicine

## 2021-06-06 ENCOUNTER — Other Ambulatory Visit (HOSPITAL_COMMUNITY): Payer: Self-pay

## 2021-06-08 ENCOUNTER — Other Ambulatory Visit (HOSPITAL_COMMUNITY): Payer: Self-pay

## 2021-06-08 MED ORDER — LEVOCETIRIZINE DIHYDROCHLORIDE 5 MG PO TABS
5.0000 mg | ORAL_TABLET | Freq: Every evening | ORAL | 0 refills | Status: DC
  Filled 2021-06-08 – 2021-06-19 (×2): qty 30, 30d supply, fill #0
  Filled 2021-07-16: qty 30, 30d supply, fill #1
  Filled 2021-08-27: qty 30, 30d supply, fill #2

## 2021-06-09 ENCOUNTER — Ambulatory Visit: Payer: BC Managed Care – PPO | Admitting: Physician Assistant

## 2021-06-09 DIAGNOSIS — Z01419 Encounter for gynecological examination (general) (routine) without abnormal findings: Secondary | ICD-10-CM | POA: Diagnosis not present

## 2021-06-09 DIAGNOSIS — Z6824 Body mass index (BMI) 24.0-24.9, adult: Secondary | ICD-10-CM | POA: Diagnosis not present

## 2021-06-09 DIAGNOSIS — Z1231 Encounter for screening mammogram for malignant neoplasm of breast: Secondary | ICD-10-CM | POA: Diagnosis not present

## 2021-06-09 DIAGNOSIS — Z124 Encounter for screening for malignant neoplasm of cervix: Secondary | ICD-10-CM | POA: Diagnosis not present

## 2021-06-16 ENCOUNTER — Other Ambulatory Visit (HOSPITAL_COMMUNITY): Payer: Self-pay

## 2021-06-19 ENCOUNTER — Other Ambulatory Visit: Payer: Self-pay | Admitting: Internal Medicine

## 2021-06-19 ENCOUNTER — Other Ambulatory Visit (HOSPITAL_COMMUNITY): Payer: Self-pay

## 2021-06-19 NOTE — Telephone Encounter (Signed)
Patient states she is out of insulin glargine-yfgn (SEMGLEE, YFGN,) 100 UNIT/ML Pen  Patient was last seen on 04-03-2021

## 2021-06-22 ENCOUNTER — Other Ambulatory Visit: Payer: Self-pay | Admitting: Internal Medicine

## 2021-06-22 ENCOUNTER — Other Ambulatory Visit (HOSPITAL_COMMUNITY): Payer: Self-pay

## 2021-06-22 NOTE — Telephone Encounter (Signed)
Can you call or mychart to find out current dosing. At last visit in Nov I asked her to increase dose on this.

## 2021-06-22 NOTE — Telephone Encounter (Signed)
Patient calling in to check status of refill request  Patient says she has been waiting since last Friday for provider to approve the medication which she needs bc she is completely out  Patient says she will "go find another dr" bc it should not take this long for a medication refill to be completed  Please send rx refill request to pharmacy for patient & let her know when completed [phone]

## 2021-06-23 ENCOUNTER — Other Ambulatory Visit (HOSPITAL_COMMUNITY): Payer: Self-pay

## 2021-06-23 MED ORDER — INSULIN GLARGINE-YFGN 100 UNIT/ML ~~LOC~~ SOPN
48.0000 [IU] | PEN_INJECTOR | Freq: Every day | SUBCUTANEOUS | 3 refills | Status: DC
  Filled 2021-06-23: qty 15, 27d supply, fill #0
  Filled 2021-07-16: qty 15, 27d supply, fill #1
  Filled 2021-08-12: qty 15, 27d supply, fill #2
  Filled 2021-09-11: qty 15, 27d supply, fill #3
  Filled 2021-10-15: qty 15, 27d supply, fill #4
  Filled 2021-11-06: qty 15, 27d supply, fill #5
  Filled 2021-12-07: qty 15, 27d supply, fill #6
  Filled 2022-01-01: qty 15, 27d supply, fill #7

## 2021-06-24 ENCOUNTER — Ambulatory Visit (INDEPENDENT_AMBULATORY_CARE_PROVIDER_SITE_OTHER): Payer: BC Managed Care – PPO | Admitting: Gastroenterology

## 2021-06-24 ENCOUNTER — Encounter: Payer: Self-pay | Admitting: Gastroenterology

## 2021-06-24 ENCOUNTER — Other Ambulatory Visit (HOSPITAL_COMMUNITY): Payer: Self-pay

## 2021-06-24 ENCOUNTER — Other Ambulatory Visit (INDEPENDENT_AMBULATORY_CARE_PROVIDER_SITE_OTHER): Payer: BC Managed Care – PPO

## 2021-06-24 VITALS — BP 122/70 | HR 72 | Ht 62.5 in | Wt 137.0 lb

## 2021-06-24 DIAGNOSIS — R197 Diarrhea, unspecified: Secondary | ICD-10-CM

## 2021-06-24 DIAGNOSIS — R1013 Epigastric pain: Secondary | ICD-10-CM

## 2021-06-24 DIAGNOSIS — R1030 Lower abdominal pain, unspecified: Secondary | ICD-10-CM | POA: Diagnosis not present

## 2021-06-24 DIAGNOSIS — K5080 Crohn's disease of both small and large intestine without complications: Secondary | ICD-10-CM

## 2021-06-24 DIAGNOSIS — R110 Nausea: Secondary | ICD-10-CM | POA: Diagnosis not present

## 2021-06-24 LAB — CBC WITH DIFFERENTIAL/PLATELET
Basophils Absolute: 0.1 10*3/uL (ref 0.0–0.1)
Basophils Relative: 0.8 % (ref 0.0–3.0)
Eosinophils Absolute: 0.2 10*3/uL (ref 0.0–0.7)
Eosinophils Relative: 2.2 % (ref 0.0–5.0)
HCT: 42.5 % (ref 36.0–46.0)
Hemoglobin: 14 g/dL (ref 12.0–15.0)
Lymphocytes Relative: 30.3 % (ref 12.0–46.0)
Lymphs Abs: 2.5 10*3/uL (ref 0.7–4.0)
MCHC: 32.9 g/dL (ref 30.0–36.0)
MCV: 87.7 fl (ref 78.0–100.0)
Monocytes Absolute: 0.8 10*3/uL (ref 0.1–1.0)
Monocytes Relative: 9.1 % (ref 3.0–12.0)
Neutro Abs: 4.8 10*3/uL (ref 1.4–7.7)
Neutrophils Relative %: 57.6 % (ref 43.0–77.0)
Platelets: 328 10*3/uL (ref 150.0–400.0)
RBC: 4.84 Mil/uL (ref 3.87–5.11)
RDW: 14.6 % (ref 11.5–15.5)
WBC: 8.3 10*3/uL (ref 4.0–10.5)

## 2021-06-24 LAB — COMPREHENSIVE METABOLIC PANEL
ALT: 52 U/L — ABNORMAL HIGH (ref 0–35)
AST: 43 U/L — ABNORMAL HIGH (ref 0–37)
Albumin: 4.1 g/dL (ref 3.5–5.2)
Alkaline Phosphatase: 114 U/L (ref 39–117)
BUN: 17 mg/dL (ref 6–23)
CO2: 28 mEq/L (ref 19–32)
Calcium: 9.4 mg/dL (ref 8.4–10.5)
Chloride: 96 mEq/L (ref 96–112)
Creatinine, Ser: 0.74 mg/dL (ref 0.40–1.20)
GFR: 86.34 mL/min (ref >60.00)
Glucose, Bld: 410 mg/dL — ABNORMAL HIGH (ref 70–99)
Potassium: 4.4 mEq/L (ref 3.5–5.1)
Sodium: 134 mEq/L — ABNORMAL LOW (ref 135–145)
Total Bilirubin: 0.5 mg/dL (ref 0.2–1.2)
Total Protein: 6.6 g/dL (ref 6.0–8.3)

## 2021-06-24 LAB — IBC PANEL
Iron: 151 ug/dL — ABNORMAL HIGH (ref 42–145)
Saturation Ratios: 31.5 % (ref 20.0–50.0)
TIBC: 478.8 ug/dL — ABNORMAL HIGH (ref 250.0–450.0)
Transferrin: 342 mg/dL (ref 212.0–360.0)

## 2021-06-24 LAB — FERRITIN: Ferritin: 20.9 ng/mL (ref 10.0–291.0)

## 2021-06-24 LAB — SEDIMENTATION RATE: Sed Rate: 10 mm/hr (ref 0–30)

## 2021-06-24 LAB — HIGH SENSITIVITY CRP: CRP, High Sensitivity: 5.9 mg/L — ABNORMAL HIGH (ref 0.000–5.000)

## 2021-06-24 MED ORDER — SUCRALFATE 1 G PO TABS
1.0000 g | ORAL_TABLET | Freq: Three times a day (TID) | ORAL | 1 refills | Status: DC
  Filled 2021-06-24: qty 90, 30d supply, fill #0

## 2021-06-24 NOTE — Patient Instructions (Signed)
You have been scheduled for an endoscopy and colonoscopy. Please follow the written instructions given to you at your visit today. Please pick up your prep supplies at the pharmacy within the next 1-3 days. If you use inhalers (even only as needed), please bring them with you on the day of your procedure.   Your provider has requested that you go to the basement level for lab work before leaving today. Press "B" on the elevator. The lab is located at the first door on the left as you exit the elevator.   STOP Prevalite  START Colestid 1 gm daily in the afternoon (Avoid taking within 3 hours of other meds)  Take Carafate 1 gm three times a day before meals  Follow up in 3 months  Due to recent changes in healthcare laws, you may see the results of your imaging and laboratory studies on MyChart before your provider has had a chance to review them.  We understand that in some cases there may be results that are confusing or concerning to you. Not all laboratory results come back in the same time frame and the provider may be waiting for multiple results in order to interpret others.  Please give [place] 48 hours in order for your provider to thoroughly review all the results before contacting the office for clarification of your results.    If you are age 45 or older, your body mass index should be between 23-30. Your Body mass index is 24.66 kg/m. If this is out of the aforementioned range listed, please consider follow up with your Primary Care Provider.  If you are age 49 or younger, your body mass index should be between 19-25. Your Body mass index is 24.66 kg/m. If this is out of the aformentioned range listed, please consider follow up with your Primary Care Provider.   ________________________________________________________  The [facility] providers would like to encourage you to use [facility] to communicate with providers for non-urgent requests or questions.  Due to long hold times on the  telephone, sending your provider a message by [facility] may be a faster and more efficient way to get a response.  Please allow 48 business hours for a response.  Please remember that this is for non-urgent requests.  _______________________________________________________   I appreciate the  opportunity to care for you  Thank You   Harl Bowie , MD

## 2021-06-24 NOTE — Progress Notes (Signed)
[name]    [id number]    [birth year]  Primary Care Physician:Crawford, Real Cons, MD  Referring Physician: Hoyt Koch, MD [address] [place],  [place] [address]   Chief complaint:  Crohn's disease  HPI:  61 year old very pleasant female with history of Crohn's disease on Entyvio for maintenance therapy here for follow-up visit for crohn's disease   She is having intermittent on and off and also has intermittent abdominal discomfort, generalized more predominant in the epigastric region and lower abdomen. She has sensation of indigestion, dyspepsia and nausea.  No vomiting, melena or rectal bleeding.   Colonoscopy December 05, 2018: Terminal ileal biopsies consistent with Crohn's disease.  IC valve polypoid lesion status post EMR, biopsies consistent with IBD negative for dysplasia or adenomatous tissue 5 mm polyp, tubular adenoma removed from transverse colon otherwise normal colon on chromoendoscopy   Colonoscopy July 07, 2018 Nodular TI biopsied Localized nodular polypoid mucosa in IC valve biopsied, ?  Adenoma on pathology report, requested additional review is pending.   EGD 08/22/2009: Esophagitis otherwise unremarkable exam      MRI pelvis February 14, 2018 showed grade 1 simple linear intersphincteric perianal fistula without associated abscess.  Mild segmental wall thickening and luminal narrowing in the distal ileum suggestive of mild active Crohn's ileitis.  Mild chronic wall thickening in the terminal ileum without active inflammatory changes.   Relevant GI history: Crohn's disease with predominant small bowel involvement diagnosed in 1991.  Initially was managed with prednisone, had significant side effects.  Subsequently was in clinical remission on 6-MP.  6-MP was discontinued in 2017 due to persistently elevated transaminases, have improved after discontinuing 6-MP.  She was started on Humira, clinically failed with persistent  symptoms.  Had undetectable drug trough with elevated antibodies.  Started on budesonide 9 mg daily and taper down to 3 mg daily in 2018.  Developed rectal pain and noted to have perianal fistula on MRI pelvis September 2019.   Relevant other PMH She had NSTEMI in November 2018, status post coronary stent placement (DES)     Outpatient Encounter Medications as of 06/24/2021  Medication Sig   acetaminophen (TYLENOL) 650 MG CR tablet Take 650 mg by mouth every 8 (eight) hours as needed for pain.   albuterol (PROVENTIL) (2.5 MG/3ML) 0.083% nebulizer solution Take 2.5 mg by nebulization every 4 (four) hours as needed for wheezing or shortness of breath.    albuterol (VENTOLIN HFA) 108 (90 Base) MCG/ACT inhaler Inhale 1-2 puffs into the lungs every 6 (six) hours as needed for wheezing or shortness of breath.   aspirin 81 MG chewable tablet    aspirin EC 81 MG tablet Take 81 mg by mouth daily. Swallow whole.   Blood Glucose Monitoring Suppl (FREESTYLE LITE) w/Device KIT Use to check blood sugar up to 4 times a day as directed   budesonide (ENTOCORT EC) 3 MG 24 hr capsule Take 3 capsules (9 mg total) by mouth daily.   busPIRone (BUSPAR) 10 MG tablet Take 1 tablet (10 mg total) by mouth 2 (two) times daily.   cholestyramine light (PREVALITE) 4 g packet Take 1 packet (4 g total) by mouth 2 (two) times daily.   colestipol (COLESTID) 1 g tablet Take 1 tablet by mouth daily.   Continuous Blood Gluc Receiver (FREESTYLE LIBRE 14 DAY READER) DEVI Use to monitor sugars   Continuous Blood Gluc Sensor (FREESTYLE LIBRE 14 DAY SENSOR) MISC Use to monitor  sugars   Continuous Blood Gluc Sensor (FREESTYLE LIBRE SENSOR SYSTEM) MISC Use for blood sugar monitoring   cyanocobalamin (,VITAMIN B-12,) 1000 MCG/ML injection 1 ML  B12 injection every 2 weeks for 3 months then decrease to 1 B12 injection to once a month   cyanocobalamin (,VITAMIN B-12,) 1000 MCG/ML injection Inject 1 ml into the muscle for 1 dose, she will  inject 1000 mcg once every 2 weeks for 1 month then monthly.   dicyclomine (BENTYL) 20 MG tablet Take 1 tablet (20 mg total) by mouth every 6 (six) hours.   DULoxetine (CYMBALTA) 60 MG capsule Take 1 capsule (60 mg total) by mouth daily.   empagliflozin (JARDIANCE) 10 MG TABS tablet Take 1 tablet (10 mg total) by mouth daily before breakfast.   enalapril (VASOTEC) 10 MG tablet Take 1 tablet (10 mg total) by mouth daily.   EPINEPHrine 0.3 mg/0.3 mL IJ SOAJ injection Use as directed   Evolocumab (REPATHA SURECLICK) 157 MG/ML SOAJ Inject the contents of 1 pen into the skin every 14 days as directed.   fenofibrate (TRICOR) 145 MG tablet TAKE ONE TABLET BY MOUTH ONCE DAILY   furosemide (LASIX) 40 MG tablet Take 1 tablet (40 mg total) by mouth daily as needed for fluid or edema.   glucose blood (TRUE METRIX BLOOD GLUCOSE TEST) test strip Use to check blood sugar up to 4 times a day as directed.   insulin glargine-yfgn (SEMGLEE, YFGN,) 100 UNIT/ML Pen Inject 48-55 Units into the skin daily.   Insulin Pen Needle (PEN NEEDLES 3/16") 31G X 5 MM MISC Use daily   Lancets (FREESTYLE) lancets Use to check blood sugar up to 4 times a day as directed   levocetirizine (XYZAL) 5 MG tablet Take 1 tablet (5 mg total) by mouth every evening.   metFORMIN (GLUCOPHAGE-XR) 500 MG 24 hr tablet Take 2 tablets (1,000 mg total) by mouth daily.   metoprolol succinate (TOPROL-XL) 25 MG 24 hr tablet Take 1 & 1/2 tablets (37.5 mg total) by mouth daily.   montelukast (SINGULAIR) 10 MG tablet Take 1 tablet by mouth every evening.   Multiple Vitamins-Minerals (MULTIVITAMIN WITH MINERALS) tablet Take 1 tablet by mouth daily.   nitroGLYCERIN (NITROSTAT) 0.4 MG SL tablet Place 1 tablet (0.4 mg total) under the tongue every 5 (five) minutes as needed for chest pain.  Call 911 if no relief after 1st dose.   ondansetron (ZOFRAN ODT) 4 MG disintegrating tablet Take 1 tablet (4 mg total) by mouth every 4 (four) hours as needed for nausea or  vomiting.   pantoprazole (PROTONIX) 40 MG tablet Take 1 tablet (40 mg total) by mouth daily.   Probiotic Product (PROBIOTIC DAILY PO) Take 1 capsule by mouth daily.   sodium chloride (OCEAN) 0.65 % SOLN nasal spray Place 1 spray into both nostrils daily as needed for congestion.   VASCEPA 1 g capsule Take 2 capsules by mouth 2 times daily.   vedolizumab (ENTYVIO) 300 MG injection Inject 300 mg into the vein every 8 (eight) weeks. After the standard induction   Vitamin D3 (VITAMIN D) 25 MCG tablet Take 1,000 Units by mouth at bedtime.   DULoxetine (CYMBALTA) 30 MG capsule Take 1 capsule by mouth daily for 14 days.   [DISCONTINUED] EPIPEN 2-PAK 0.3 MG/0.3ML SOAJ injection Inject 0.3 mg into the muscle as needed for anaphylaxis. Reported on 06/27/2015   [DISCONTINUED] insulin glargine (LANTUS) 100 UNIT/ML injection Inject 0.48 mLs (48 Units total) into the skin at bedtime.   No  facility-administered encounter medications on file as of 06/24/2021.    Allergies as of 06/24/2021 - Review Complete 06/24/2021  Allergen Reaction Noted   Lipitor [atorvastatin] Other (See Comments) 03/28/2013   Remicade [infliximab]     Ticagrelor  09/16/2017   Rosuvastatin Other (See Comments) 01/25/2019    Past Medical History:  Diagnosis Date   Allergic rhinitis due to pollen    Allergy    Anxiety state, unspecified    Arthritis    Bronchitis    hx   CAD (coronary artery disease)    s/p NSTEMI in 11/18 treated with a DES to the proximal LAD   Calculus of gallbladder without mention of cholecystitis or obstruction    Crohn disease ([place])    Depressive disorder, not elsewhere classified    Diabetes mellitus without complication ([place])    diet controlled   Esophageal reflux    Fibromyalgia    Grade I diastolic dysfunction    Noted on ECHO   Headache    History of nuclear stress test    ETT-Myoview 10/17: EF 55%, normal perfusion; Low Risk // Nuclear stress test 05/2018:  EF 63, normal perfusion; Low Risk    Myalgia and myositis, unspecified    NSTEMI (non-ST elevated myocardial infarction) ([place]) 04/15/2017   Other and unspecified hyperlipidemia    Palpitations    Pneumonia 13   hx   S/P angioplasty with stent 04/18/17 with DES to pLAD 04/19/2017   Seizures ([place])    1 seizure as a teenager    Unspecified essential hypertension    Vitamin B deficiency     Past Surgical History:  Procedure Laterality Date   BIOPSY  12/05/2018   Procedure: BIOPSY;  Surgeon: Mauri Pole, MD;  Location: [facility];  Service: Endoscopy;;   CHOLECYSTECTOMY N/A 09/19/2014   Procedure: LAPAROSCOPIC CHOLECYSTECTOMY WITH INTRAOPERATIVE CHOLANGIOGRAM;  Surgeon: Donnie Mesa, MD;  Location: [facility];  Service: General;  Laterality: N/A;   COLONOSCOPY WITH PROPOFOL N/A 12/05/2018   Procedure: COLONOSCOPY WITH PROPOFOL;  Surgeon: Mauri Pole, MD;  Location: [facility];  Service: Endoscopy;  Laterality: N/A;  chromoendoscopy   CORONARY STENT INTERVENTION N/A 04/18/2017   Procedure: CORONARY STENT INTERVENTION;  Surgeon: Martinique, Peter M, MD;  Location: [facility];  Service: Cardiovascular;  Laterality: N/A;   ESOPHAGOGASTRODUODENOSCOPY     EXPLORATORY LAPAROTOMY     for endometriosis   INTRAVASCULAR PRESSURE WIRE/FFR STUDY N/A 04/18/2017   Procedure: INTRAVASCULAR PRESSURE WIRE/FFR STUDY;  Surgeon: Martinique, Peter M, MD;  Location: [facility];  Service: Cardiovascular;  Laterality: N/A;   LEFT HEART CATH AND CORONARY ANGIOGRAPHY N/A 04/18/2017   Procedure: LEFT HEART CATH AND CORONARY ANGIOGRAPHY;  Surgeon: Martinique, Peter M, MD;  Location: [facility];  Service: Cardiovascular;  Laterality: N/A;   POLYPECTOMY  12/05/2018   Procedure: POLYPECTOMY;  Surgeon: Mauri Pole, MD;  Location: [facility];  Service: Endoscopy;;   SUBMUCOSAL LIFTING INJECTION  12/05/2018   Procedure: SUBMUCOSAL LIFTING INJECTION;  Surgeon: Mauri Pole, MD;  Location: [facility];  Service:  Endoscopy;;   TONSILLECTOMY AND ADENOIDECTOMY     TUBAL LIGATION     BILATERAL    Family History  Problem Relation Age of Onset   Lung cancer Maternal Grandmother    Stroke Father    Hypertension Mother    Heart attack Mother    Heart disease Mother    Hypertension Brother    Heart disease Brother    Hypertension Sister  Heart disease Sister    Coronary artery disease Brother    Colon cancer Neg Hx    Stomach cancer Neg Hx    Esophageal cancer Neg Hx    Rectal cancer Neg Hx     Social History   Socioeconomic History   Marital status: Married    Spouse name: Dane   Number of children: 2   Years of education: 12+   Highest education level: Not on file  Occupational History   Occupation: ACCOUNT REP.    Employer: [employer]    Comment: retired  Tobacco Use   Smoking status: Former    Packs/day: 1.00    Years: 35.00    Pack years: 35.00    Types: Cigarettes    Quit date: 05/31/2004    Years since quitting: 17.0   Smokeless tobacco: Never  Vaping Use   Vaping Use: Never used  Substance and Sexual Activity   Alcohol use: No   Drug use: No   Sexual activity: Not Currently    Comment: married  Other Topics Concern   Not on file  Social History Narrative   Lives with her husband and their pets. Her children are adults and live independently.   Social Determinants of Health   Financial Resource Strain: Not on file  Food Insecurity: Not on file  Transportation Needs: Not on file  Physical Activity: Not on file  Stress: Not on file  Social Connections: Not on file  Intimate Partner Violence: Not on file      Review of systems: All other review of systems negative except as mentioned in the HPI.   Physical Exam: Vitals:   06/24/21 0850  BP: 122/70  Pulse: 72   Body mass index is 24.66 kg/m. Gen:      No acute distress HEENT:  sclera anicteric Abd:      soft, non-tender; no palpable masses, no distension Ext:    No edema Neuro:  alert and oriented x 3 Psych: normal mood and affect  Data Reviewed:  Reviewed labs, radiology imaging, old records and pertinent past GI work up   Assessment and Plan/Recommendations:  61 year old very pleasant female with history of Crohn's disease, diabetes and CAD here for follow up visit with complaints of epigastric abdominal pain, lower abdominal discomfort and diarrhea concerning for Crohn's flare  Scheduled for EGD and colonoscopy for further evaluation The risks and benefits as well as alternatives of endoscopic procedure(s) have been discussed and reviewed. All questions answered. The patient agrees to proceed.   Continue Entyvio every 8 weeks for maintenance  We will switch to Colestid 1 g daily.  Stop Prevalite.  Advised patient to avoid taking it within 2 to 3 hours of other medications to prevent drug interaction   Abnormal LFT with elevated transaminases likely secondary to acute viral syndrome, LFTs have trended down to baseline.    Vitamin B12 deficiency: Continue B12 injections    GERD: Symptoms stable continue daily Protonix and antireflux measures   Due for surveillance colonoscopy 2023, will schedule it at next follow-up visit.     Return in 3 months for follow-up visit or sooner if needed  This visit required 40 minutes of patient care (this includes precharting, chart review, review of results, face-to-face time used for counseling as well as treatment plan and follow-up. The patient was provided an opportunity to ask questions and all were answered. The patient agreed with the plan and demonstrated an understanding of the instructions.  K.  Denzil Magnuson , MD    CC: Hoyt Koch, *

## 2021-06-25 ENCOUNTER — Ambulatory Visit: Payer: BC Managed Care – PPO | Admitting: Physician Assistant

## 2021-06-26 LAB — GI PROFILE, STOOL, PCR
Adenovirus F 40/41: NOT DETECTED
Astrovirus: NOT DETECTED
C difficile toxin A/B: DETECTED — AB
Campylobacter: NOT DETECTED
Cryptosporidium: NOT DETECTED
Cyclospora cayetanensis: NOT DETECTED
Entamoeba histolytica: NOT DETECTED
Enteroaggregative E coli: NOT DETECTED
Enteropathogenic E coli: DETECTED — AB
Enterotoxigenic E coli: NOT DETECTED
Giardia lamblia: NOT DETECTED
Norovirus GI/GII: NOT DETECTED
Plesiomonas shigelloides: NOT DETECTED
Rotavirus A: NOT DETECTED
Salmonella: NOT DETECTED
Sapovirus: NOT DETECTED
Shiga-toxin-producing E coli: NOT DETECTED
Shigella/Enteroinvasive E coli: NOT DETECTED
Vibrio cholerae: NOT DETECTED
Vibrio: NOT DETECTED
Yersinia enterocolitica: NOT DETECTED

## 2021-06-27 NOTE — Progress Notes (Deleted)
Cardiology Office Note:    Date:  06/27/2021   ID:  [name], DOB 1960-10-12, MRN [id number]  PCP:  Hoyt Koch, MD   [place]  Cardiologist:  Freada Bergeron, MD  Advanced Practice Provider:  No care team member to display Electrophysiologist:  None  :[id number]}   Referring MD: Hoyt Koch, *     History of Present Illness:    [name] is a 61 y.o. female with a hx of CAD s/p DES to pLAD in 03/2017, HLD,  palpitations, HTN, DM and Crohn's disease who presents for follow up.   Had previously seen Dr. Meda Coffee for chest pain where [facility] 07/2019 was low risk without ischemia or scar.   We last saw her in clinic on 07/14/20 where she was having fluctuating blood pressures as well as intermittent episodes of shortness of breath. Felt fatigued at the time. She followed up with HTN clinic where her home readings were thought to be unreliable due to poor technique. She was continued on enalapril 62may and metop 259mdaily.   She was seen in clinic on 08/11/20 with DOE as well as symptoms from her fibromyalgia. She was undergoing clearance for EGD. Myoview 08/18/20 obtained without evidence of ischemia or infarction.  Was last seen in clinic on 02/25/21 with CaLaurann Montanahere she was overall stable with continued mild DOE.     Past Medical History:  Diagnosis Date   Allergic rhinitis due to pollen    Allergy    Anxiety state, unspecified    Arthritis    Bronchitis    hx   CAD (coronary artery disease)    s/p NSTEMI in 11/18 treated with a DES to the proximal LAD   Calculus of gallbladder without mention of cholecystitis or obstruction    Crohn disease ([facility]   Depressive disorder, not elsewhere classified    Diabetes mellitus without complication ([facility]   diet controlled   Esophageal reflux    Fibromyalgia    Grade I diastolic dysfunction    Noted on ECHO   Headache    History of nuclear stress test    ETT-Myoview  10/17: EF 55%, normal perfusion; Low Risk // Nuclear stress test 05/2018:  EF 63, normal perfusion; Low Risk   Myalgia and myositis, unspecified    NSTEMI (non-ST elevated myocardial infarction) ([facility]11/16/2018   Other and unspecified hyperlipidemia    Palpitations    Pneumonia 13   hx   S/P angioplasty with stent 04/18/17 with DES to pLAD 04/19/2017   Seizures ([facility]   1 seizure as a teenager    Unspecified essential hypertension    Vitamin B deficiency     Past Surgical History:  Procedure Laterality Date   BIOPSY  12/05/2018   Procedure: BIOPSY;  Surgeon: NaMauri PoleMD;  [facility];  Service: Endoscopy;;   CHOLECYSTECTOMY N/A 09/19/2014   Procedure: LAPAROSCOPIC CHOLECYSTECTOMY WITH INTRAOPERATIVE CHOLANGIOGRAM;  Surgeon: MaDonnie MesaMD;  [facility] Service: General;  Laterality: N/A;   COLONOSCOPY WITH PROPOFOL N/A 12/05/2018   Procedure: COLONOSCOPY WITH PROPOFOL;  Surgeon: NaMauri PoleMD;  [facility];  Service: Endoscopy;  Laterality: N/A;  chromoendoscopy   CORONARY STENT INTERVENTION N/A 04/18/2017   Procedure: CORONARY STENT INTERVENTION;  Surgeon: JoMartiniquePeter M, MD;  [facility];  Service: Cardiovascular;  Laterality: N/A;   ESOPHAGOGASTRODUODENOSCOPY     EXPLORATORY LAPAROTOMY  for endometriosis   INTRAVASCULAR PRESSURE WIRE/FFR STUDY N/A 04/18/2017   Procedure: INTRAVASCULAR PRESSURE WIRE/FFR STUDY;  Surgeon: Martinique, Peter M, MD;  Location: [place] CV LAB;  Service: Cardiovascular;  Laterality: N/A;   LEFT HEART CATH AND CORONARY ANGIOGRAPHY N/A 04/18/2017   Procedure: LEFT HEART CATH AND CORONARY ANGIOGRAPHY;  Surgeon: Martinique, Peter M, MD;  Location: [place] CV LAB;  Service: Cardiovascular;  Laterality: N/A;   POLYPECTOMY  12/05/2018   Procedure: POLYPECTOMY;  Surgeon: Mauri Pole, MD;  [facility];  Service: Endoscopy;;   SUBMUCOSAL LIFTING INJECTION  12/05/2018   Procedure:  SUBMUCOSAL LIFTING INJECTION;  Surgeon: Mauri Pole, MD;  Location: [facility];  Service: Endoscopy;;   TONSILLECTOMY AND ADENOIDECTOMY     TUBAL LIGATION     BILATERAL    Current Medications: No outpatient medications have been marked as taking for the 07/01/21 encounter (Appointment) with Freada Bergeron, MD.     Allergies:   Lipitor [atorvastatin], Remicade [infliximab], Ticagrelor, and Rosuvastatin   Social History   Socioeconomic History   Marital status: Married    Spouse name: Dane   Number of children: 2   Years of education: 12+   Highest education level: Not on file  Occupational History   Occupation: ACCOUNT REP.    Employer: [employer]    Comment: retired  Tobacco Use   Smoking status: Former    Packs/day: 1.00    Years: 35.00    Pack years: 35.00    Types: Cigarettes    Quit date: 05/31/2004    Years since quitting: 17.0   Smokeless tobacco: Never  Vaping Use   Vaping Use: Never used  Substance and Sexual Activity   Alcohol use: No   Drug use: No   Sexual activity: Not Currently    Comment: married  Other Topics Concern   Not on file  Social History Narrative   Lives with her husband and their pets. Her children are adults and live independently.   Social Determinants of Health   Financial Resource Strain: Not on file  Food Insecurity: Not on file  Transportation Needs: Not on file  Physical Activity: Not on file  Stress: Not on file  Social Connections: Not on file     Family History: The patient's family history includes Coronary artery disease in her brother; Heart attack in her mother; Heart disease in her brother, mother, and sister; Hypertension in her brother, mother, and sister; Lung cancer in her maternal grandmother; Stroke in her father. There is no history of Colon cancer, Stomach cancer, Esophageal cancer, or Rectal cancer.  ROS:   Please see the history of present illness.    Review of Systems   Constitutional:  Positive for malaise/fatigue. Negative for chills and fever.  HENT:  Negative for hearing loss and sore throat.   Eyes:  Negative for blurred vision and redness.  Respiratory:  Positive for shortness of breath. Negative for cough.   Cardiovascular:  Positive for chest pain. Negative for palpitations, orthopnea, claudication, leg swelling and PND.  Gastrointestinal:  Positive for abdominal pain and heartburn. Negative for blood in stool, melena, nausea and vomiting.  Genitourinary:  Negative for dysuria and hematuria.  Musculoskeletal:  Positive for back pain, joint pain and myalgias.  Neurological:  Negative for dizziness and loss of consciousness.  Endo/Heme/Allergies:  Negative for polydipsia.  Psychiatric/Behavioral:  Negative for substance abuse.    EKGs/Labs/Other Studies Reviewed:    The following studies were reviewed today:  Stress test 07/2019 Normal perfusion No ischemia or scar The left ventricular ejection fraction is normal (55-65%). There was no ST segment deviation noted during stress. This is a low risk study.   Echo 03/2017 Left ventricle: The cavity size was normal. Wall thickness was    normal. Systolic function was normal. The estimated ejection    fraction was in the range of 55% to 60%. Mild hypokinesis of the    apicalanteroseptal myocardium. Doppler parameters are consistent    with abnormal left ventricular relaxation (grade 1 diastolic    dysfunction).  - Pulmonary arteries: Systolic pressure was mildly increased. PA    peak pressure: 35 mm Hg (S).    CORONARY STENT INTERVENTION   03/2017  INTRAVASCULAR PRESSURE WIRE/FFR STUDY  LEFT HEART CATH AND CORONARY ANGIOGRAPHY      Conclusion     Prox LAD lesion is 65% stenosed. A drug-eluting stent was successfully placed using a STENT PROMUS PREM MR 3.0X12. Post intervention, there is a 0% residual stenosis. The left ventricular systolic function is normal. LV end diastolic pressure is  normal. The left ventricular ejection fraction is 50-55% by visual estimate.   1. Single vessel obstructive CAD. There is focal and somewhat hazy lesion in the proximal LAD. iFr was 0.88 with FFR of .82. This is of borderline significance but given her clinical scenario with typical chest pain, Ecg changes, and T wave abnormality along with anterior wall motion abnormality we decided to treat this lesion.  2. Overall good LV function 3. Normal LVEDP 4. Successful stenting of the proximal LAD with DES   Plan: DAPT for one year. Anticipate DC tomorrow.     EKG:  EKG is  ordered today.  The ekg ordered today demonstrates NSR with HR 80  Recent Labs: 06/24/2021: ALT 52; BUN 17; Creatinine, Ser 0.74; Hemoglobin 14.0; Platelets 328.0; Potassium 4.4; Sodium 134  Recent Lipid Panel    Component Value Date/Time   CHOL 105 07/14/2020 1007   TRIG 469 (H) 07/14/2020 1007   HDL 37 (L) 07/14/2020 1007   CHOLHDL 2.8 07/14/2020 1007   CHOLHDL 7 12/18/2015 1003   VLDL 53.6 (H) 08/15/2014 0734   LDLCALC 7 07/14/2020 1007   LDLDIRECT 23 07/14/2020 1007   LDLDIRECT 85.0 12/18/2015 1003     Risk Assessment/Calculations:       Physical Exam:    VS:  LMP  (LMP Unknown)     Wt Readings from Last 3 Encounters:  06/24/21 137 lb (62.1 kg)  04/03/21 140 lb (63.5 kg)  03/10/21 138 lb (62.6 kg)     GEN:  Well nourished, well developed in no acute distress HEENT: Normal NECK: No JVD; No carotid bruits CARDIAC: RRR, no murmurs, rubs, gallops RESPIRATORY:  Clear to auscultation without rales, wheezing or rhonchi  ABDOMEN: Soft, non-tender, non-distended MUSCULOSKELETAL:  No edema; No deformity  SKIN: Warm and dry NEUROLOGIC:  Alert and oriented x 3 PSYCHIATRIC:  Normal affect   ASSESSMENT:    No diagnosis found.  PLAN:    In order of problems listed above:  #Known CAD s/p pLAD stent in 2018: #SOB and chest tightness Myoview 07/2020 without evidence of ischemia or infarction. Symptoms  stable.  -Continue ASA 73m daily -Continue enalapril 186mdaily -Continue metop 251mL -Management of HLD as below  #HTN: -Continue enalapril 76m69mily -Continue metop 25mg12mdaily    #HLD: #Hypertriglyceridemia Intolerant of statins. LDL controlled at 7. TG 469 (was not taking her fenofibrate or vascepa) -Continue  repatha  -Continue fenofibrate 172m daily -Continue vascepa 2g BID -Patient states that she has been compliant with medications since last visit  #Generalized Malaise/Fatigue #Joint Pain: Patient concerned about worsening fatigue, total body aches and joint pain. Attempted to refer to rheumatology but not taking new fibromyalgia patients. Has had multiple sleep studies which were negative for significant OSA. -Management per primary care   #DMII: Having frequent episodes of high blood glucose due to dietary indiscretion. Currently on insulin at night. A1C 8.2 -Referred to endocrinology for further management of DMII -Emphasized importance of dietary compliance      Medication Adjustments/Labs and Tests Ordered: Current medicines are reviewed at length with the patient today.  Concerns regarding medicines are outlined above.  No orders of the defined types were placed in this encounter.  No orders of the defined types were placed in this encounter.   There are no Patient Instructions on file for this visit.    Signed, HFreada Bergeron MD  06/27/2021 9:02 PM    [place]

## 2021-06-29 ENCOUNTER — Other Ambulatory Visit (HOSPITAL_COMMUNITY): Payer: Self-pay

## 2021-06-29 ENCOUNTER — Other Ambulatory Visit: Payer: Self-pay

## 2021-06-29 MED ORDER — VANCOMYCIN HCL 125 MG PO CAPS
125.0000 mg | ORAL_CAPSULE | Freq: Four times a day (QID) | ORAL | 0 refills | Status: AC
  Filled 2021-06-29: qty 12, 3d supply, fill #0
  Filled 2021-06-29: qty 44, 11d supply, fill #0

## 2021-06-29 MED ORDER — CIPROFLOXACIN HCL 500 MG PO TABS
500.0000 mg | ORAL_TABLET | Freq: Two times a day (BID) | ORAL | 0 refills | Status: AC
  Filled 2021-06-29: qty 6, 3d supply, fill #0

## 2021-06-30 ENCOUNTER — Other Ambulatory Visit (HOSPITAL_COMMUNITY): Payer: Self-pay

## 2021-07-01 ENCOUNTER — Ambulatory Visit: Payer: BC Managed Care – PPO | Admitting: Cardiology

## 2021-07-07 ENCOUNTER — Encounter: Payer: Self-pay | Admitting: Gastroenterology

## 2021-07-07 ENCOUNTER — Other Ambulatory Visit (HOSPITAL_COMMUNITY): Payer: Self-pay

## 2021-07-08 ENCOUNTER — Other Ambulatory Visit (HOSPITAL_COMMUNITY): Payer: Self-pay

## 2021-07-08 ENCOUNTER — Telehealth: Payer: Self-pay | Admitting: Gastroenterology

## 2021-07-08 ENCOUNTER — Other Ambulatory Visit: Payer: Self-pay | Admitting: Gastroenterology

## 2021-07-08 MED ORDER — BUDESONIDE 3 MG PO CPEP
9.0000 mg | ORAL_CAPSULE | Freq: Every day | ORAL | 0 refills | Status: DC
  Filled 2021-07-08: qty 90, 30d supply, fill #0

## 2021-07-08 NOTE — Telephone Encounter (Signed)
Inbound call from patient requesting medication refill for budesonide sent to [place]

## 2021-07-08 NOTE — Telephone Encounter (Signed)
Budesonide sent to [place]

## 2021-07-16 ENCOUNTER — Other Ambulatory Visit (HOSPITAL_COMMUNITY): Payer: Self-pay

## 2021-07-27 ENCOUNTER — Telehealth: Payer: Self-pay

## 2021-07-27 NOTE — Telephone Encounter (Signed)
Pt is calling in after receiving a BS reading of 510. Pt then took 52 units of insulin and it brought down to 450.   Pt states she is out of Metformin x 4 day.  Pt is requesting a refill of: metFORMIN (GLUCOPHAGE-XR) 500 MG 24 hr tablet  Pharmacy: [facility]  LOV 04/03/21  Pt CB [phone]

## 2021-07-27 NOTE — Telephone Encounter (Signed)
New note not needed

## 2021-07-28 ENCOUNTER — Other Ambulatory Visit (HOSPITAL_COMMUNITY): Payer: Self-pay

## 2021-07-28 MED ORDER — METFORMIN HCL ER 500 MG PO TB24
1000.0000 mg | ORAL_TABLET | Freq: Every day | ORAL | 0 refills | Status: DC
  Filled 2021-07-28: qty 60, 30d supply, fill #0

## 2021-07-28 NOTE — Telephone Encounter (Signed)
Called pt. [name] at [phone] letting her know that she has been scheduled to see Dr. Sharlet Salina in regards to her blood sugars on 33/2023 at 9 am. 30 day supply of Metformin has been sent to the pt's pharmacy. Office number was provided in case she needs to r/s her appt.

## 2021-07-28 NOTE — Telephone Encounter (Signed)
Ok to refill for 30 day only, she did not do requested labs after last visit. Needs visit with PCP within 1 week.

## 2021-07-29 ENCOUNTER — Other Ambulatory Visit (HOSPITAL_COMMUNITY): Payer: Self-pay

## 2021-07-29 ENCOUNTER — Telehealth: Payer: Self-pay | Admitting: Internal Medicine

## 2021-07-29 NOTE — Telephone Encounter (Signed)
PT visits today and left paperwork to be filled out by Dr.Crawford! Paperwork has been left in the mailbox! ? ?CB: [phone] ?

## 2021-07-29 NOTE — Telephone Encounter (Signed)
Handicap placard form placed in your box for signature ?

## 2021-07-30 ENCOUNTER — Encounter: Payer: Self-pay | Admitting: Gastroenterology

## 2021-07-31 ENCOUNTER — Ambulatory Visit: Payer: BC Managed Care – PPO | Admitting: Internal Medicine

## 2021-08-03 ENCOUNTER — Other Ambulatory Visit (HOSPITAL_COMMUNITY): Payer: Self-pay

## 2021-08-05 ENCOUNTER — Telehealth: Payer: Self-pay

## 2021-08-05 ENCOUNTER — Encounter: Payer: BC Managed Care – PPO | Admitting: Gastroenterology

## 2021-08-05 NOTE — Telephone Encounter (Signed)
Patient called to report her blood sugar after completing the colon prep is 450 this morning.  To err on the side of caution Dr. Silverio Decamp would like to reschedule the procedure for this patient.  New procedure date is 4/26.  Instructions printed and put in outgoing mail and sent via [place].  Encouraged patient to follow up with her PCP about the high blood sugar.  Patient verbalized understanding. ?

## 2021-08-10 ENCOUNTER — Ambulatory Visit: Payer: BC Managed Care – PPO | Admitting: Internal Medicine

## 2021-08-12 ENCOUNTER — Other Ambulatory Visit (HOSPITAL_COMMUNITY): Payer: Self-pay

## 2021-08-14 ENCOUNTER — Ambulatory Visit: Payer: BC Managed Care – PPO | Admitting: Internal Medicine

## 2021-08-14 ENCOUNTER — Other Ambulatory Visit: Payer: Self-pay

## 2021-08-14 ENCOUNTER — Encounter: Payer: Self-pay | Admitting: Internal Medicine

## 2021-08-14 ENCOUNTER — Other Ambulatory Visit (HOSPITAL_COMMUNITY): Payer: Self-pay

## 2021-08-14 VITALS — BP 124/74 | HR 75 | Resp 18 | Ht 62.5 in | Wt 137.4 lb

## 2021-08-14 DIAGNOSIS — E118 Type 2 diabetes mellitus with unspecified complications: Secondary | ICD-10-CM | POA: Diagnosis not present

## 2021-08-14 DIAGNOSIS — E1169 Type 2 diabetes mellitus with other specified complication: Secondary | ICD-10-CM

## 2021-08-14 DIAGNOSIS — J302 Other seasonal allergic rhinitis: Secondary | ICD-10-CM

## 2021-08-14 DIAGNOSIS — E785 Hyperlipidemia, unspecified: Secondary | ICD-10-CM

## 2021-08-14 DIAGNOSIS — J3089 Other allergic rhinitis: Secondary | ICD-10-CM

## 2021-08-14 DIAGNOSIS — I700 Atherosclerosis of aorta: Secondary | ICD-10-CM | POA: Diagnosis not present

## 2021-08-14 LAB — POCT GLYCOSYLATED HEMOGLOBIN (HGB A1C): Hemoglobin A1C: 11.7 % — AB (ref 4.0–5.6)

## 2021-08-14 LAB — LIPID PANEL
Cholesterol: 204 mg/dL — ABNORMAL HIGH (ref 0–200)
HDL: 37.4 mg/dL — ABNORMAL LOW (ref >39.00)
Total CHOL/HDL Ratio: 5
Triglycerides: 484 mg/dL — ABNORMAL HIGH (ref 0.0–149.0)

## 2021-08-14 LAB — MICROALBUMIN / CREATININE URINE RATIO
Creatinine,U: 48.7 mg/dL
Microalb Creat Ratio: 1.4 mg/g (ref 0.0–30.0)
Microalb, Ur: <0.7 mg/dL (ref 0.0–1.9)

## 2021-08-14 LAB — LDL CHOLESTEROL, DIRECT: Direct LDL: 102 mg/dL

## 2021-08-14 MED ORDER — OLOPATADINE HCL 0.1 % OP SOLN
1.0000 [drp] | Freq: Two times a day (BID) | OPHTHALMIC | 12 refills | Status: DC
  Filled 2021-08-14: qty 5, 25d supply, fill #0

## 2021-08-14 MED ORDER — GLIMEPIRIDE 2 MG PO TABS
2.0000 mg | ORAL_TABLET | Freq: Every day | ORAL | 3 refills | Status: DC
  Filled 2021-08-14: qty 30, 30d supply, fill #0
  Filled 2021-09-11: qty 30, 30d supply, fill #1
  Filled 2021-11-06: qty 30, 30d supply, fill #2
  Filled 2021-12-14: qty 30, 30d supply, fill #3

## 2021-08-14 NOTE — Assessment & Plan Note (Signed)
She is taking repatha and is unable to tolerate statins. Will continue. ?

## 2021-08-14 NOTE — Progress Notes (Signed)
? ?  Subjective:  ? ?Patient ID: [name], female    DOB: March 11, 1961, 61 y.o.   MRN: [id number] ? ?HPI ?The patient is a 61 YO female coming in for follow up. ? ?Review of Systems  ?Constitutional:  Positive for fatigue.  ?HENT: Negative.    ?Eyes: Negative.   ?Respiratory:  Negative for cough, chest tightness and shortness of breath.   ?Cardiovascular:  Negative for chest pain, palpitations and leg swelling.  ?Gastrointestinal:  Negative for abdominal distention, abdominal pain, constipation, diarrhea, nausea and vomiting.  ?Musculoskeletal: Negative.   ?Skin: Negative.   ?Neurological: Negative.   ?Psychiatric/Behavioral: Negative.    ? ?Objective:  ?Physical Exam ?Constitutional:   ?   Appearance: She is well-developed.  ?HENT:  ?   Head: Normocephalic and atraumatic.  ?Cardiovascular:  ?   Rate and Rhythm: Normal rate and regular rhythm.  ?Pulmonary:  ?   Effort: Pulmonary effort is normal. No respiratory distress.  ?   Breath sounds: Normal breath sounds. No wheezing or rales.  ?Abdominal:  ?   General: Bowel sounds are normal. There is no distension.  ?   Palpations: Abdomen is soft.  ?   Tenderness: There is no abdominal tenderness. There is no rebound.  ?Musculoskeletal:  ?   Cervical back: Normal range of motion.  ?Skin: ?   General: Skin is warm and dry.  ?   Comments: Foot exam done  ?Neurological:  ?   Mental Status: She is alert and oriented to person, place, and time.  ?   Coordination: Coordination normal.  ? ? ?Vitals:  ? 08/14/21 0848  ?BP: 124/74  ?Pulse: 75  ?Resp: 18  ?SpO2: 98%  ?Weight: 137 lb 6.4 oz (62.3 kg)  ?Height: 5' 2.5" (1.588 m)  ? ? ?This visit occurred during the SARS-CoV-2 public health emergency.  Safety protocols were in place, including screening questions prior to the visit, additional usage of staff PPE, and extensive cleaning of exam room while observing appropriate contact time as indicated for disinfecting solutions.  ? ?Assessment & Plan:  ? ?

## 2021-08-14 NOTE — Assessment & Plan Note (Signed)
Having eye symptoms today rx patanol eye drops to use and referral to optho for eye exam. ?

## 2021-08-14 NOTE — Assessment & Plan Note (Signed)
Checking lipid panel and adjust repatha 140 mg every 2 weeks and fenofibrate 145 mg daily.  ?

## 2021-08-14 NOTE — Assessment & Plan Note (Addendum)
Severe exacerbation with POC HGA1c 11.7 today. She is taking 52 units insulin semglee daily which is not adequate. Morning sugars are consistently 270+. We will have her increase by 3 units once a week until morning sugar is average 150. We are also starting amaryl 2 mg daily to help with elevated post-meal sugars. She is taking metformin 1000 mg daily which we will maintain. She has high pill burden so we are trying to improve control without adding excessive medications. Will communicate with her GI provider to see if they can taper her budesonide to help with glucose control. She has prior history of CV disease but due to insurance has high medication costs so at this time jardiance is not a good option to start. We can consider that if needed. Close follow up 1-2 months to adjust insulin further depending on sugars. She is having symptoms of fatigue and feeling poorly all the time due to high sugars. Referral to optho as she has not had eye exam in some time and has poorly controlled diabetes.  ?

## 2021-08-14 NOTE — Patient Instructions (Addendum)
We have sent in amaryl (glimepiride) to take 1 pill daily with first meal of the day to help the sugars. ? ?We will increase the insulin by 3 units every 1 week until the morning sugar is consistently 100-150.  ? ? ?

## 2021-08-17 ENCOUNTER — Other Ambulatory Visit (HOSPITAL_COMMUNITY): Payer: Self-pay

## 2021-08-17 DIAGNOSIS — K50813 Crohn's disease of both small and large intestine with fistula: Secondary | ICD-10-CM | POA: Diagnosis not present

## 2021-08-21 ENCOUNTER — Telehealth: Payer: Self-pay | Admitting: Internal Medicine

## 2021-08-21 NOTE — Telephone Encounter (Signed)
Pt checking status of 08-14-2021 lab results ? ?Informed pt of provider's 08-17-2021 result notes ? ?Pt states she is currently taking fenofibrate, but is not taking vascepa or repatha. ? ?Pt verbalized understanding ?

## 2021-08-25 ENCOUNTER — Telehealth: Payer: Self-pay

## 2021-08-25 NOTE — Telephone Encounter (Signed)
Called the patient. No answer. Left her a message on her voicemail with information about taper. Asked she call me back to discuss further and set up her appointment. ?

## 2021-08-25 NOTE — Telephone Encounter (Signed)
-----  Message from Kavitha Nandigam V, MD sent at 08/25/2021  3:34 PM EDT ----- ?Beth, please advise patient to decrease budesonide to 6 mg daily for 1 week and then go down to 3 mg daily for 1 week and stop. ?Please bring her in for GI office follow-up visit next available appointment in 2 to 4 weeks with me or app.  Thanks ?----- Message ----- ?From: Crawford,  A, MD ?Sent: 08/14/2021  12:14 PM EDT ?To: Kavitha Nandigam V, MD ? ?Mutual patient: she is on budesonide 9 mg daily and her HgA1c has shot up to 11.7 from 8 last time, your last note doesn't mention taking the budesonide but could you consider taper of this in future? Most recent ESR/CRP fairly normal Thanks, Liz Crawford ? ?

## 2021-08-26 NOTE — Telephone Encounter (Signed)
Arranged the follow up with the patient.  ?

## 2021-08-26 NOTE — Telephone Encounter (Signed)
Called the patient. She did get the message and will begin the taper on the budesonide. She is feeling well. She is scheduled for her EGD/colon 09/23/21. ?Does her office visit need to be before this? ?

## 2021-08-26 NOTE — Telephone Encounter (Signed)
Please bring her in for office visit in Early May 1-2 weeks after procedure. Thanks ?

## 2021-08-27 ENCOUNTER — Other Ambulatory Visit: Payer: Self-pay | Admitting: Internal Medicine

## 2021-08-27 ENCOUNTER — Other Ambulatory Visit (HOSPITAL_COMMUNITY): Payer: Self-pay

## 2021-08-27 ENCOUNTER — Other Ambulatory Visit: Payer: Self-pay

## 2021-08-27 ENCOUNTER — Other Ambulatory Visit: Payer: Self-pay | Admitting: *Deleted

## 2021-08-27 MED ORDER — ENALAPRIL MALEATE 10 MG PO TABS
10.0000 mg | ORAL_TABLET | Freq: Every day | ORAL | 0 refills | Status: DC
  Filled 2021-08-27: qty 30, 30d supply, fill #0

## 2021-08-27 MED ORDER — METFORMIN HCL ER 500 MG PO TB24
1000.0000 mg | ORAL_TABLET | Freq: Every day | ORAL | 0 refills | Status: DC
  Filled 2021-08-27: qty 60, 30d supply, fill #0

## 2021-09-11 ENCOUNTER — Other Ambulatory Visit (HOSPITAL_COMMUNITY): Payer: Self-pay

## 2021-09-11 ENCOUNTER — Other Ambulatory Visit: Payer: Self-pay | Admitting: Gastroenterology

## 2021-09-11 MED ORDER — COLESTIPOL HCL 1 G PO TABS
1.0000 g | ORAL_TABLET | Freq: Every day | ORAL | 1 refills | Status: DC
  Filled 2021-09-11: qty 30, 30d supply, fill #0

## 2021-09-12 ENCOUNTER — Other Ambulatory Visit (HOSPITAL_COMMUNITY): Payer: Self-pay

## 2021-09-15 ENCOUNTER — Other Ambulatory Visit: Payer: Self-pay

## 2021-09-15 ENCOUNTER — Other Ambulatory Visit (HOSPITAL_COMMUNITY): Payer: Self-pay

## 2021-09-15 ENCOUNTER — Other Ambulatory Visit: Payer: Self-pay | Admitting: Internal Medicine

## 2021-09-15 MED ORDER — METFORMIN HCL ER 500 MG PO TB24
1000.0000 mg | ORAL_TABLET | Freq: Every day | ORAL | 0 refills | Status: DC
  Filled 2021-09-15 – 2021-09-21 (×2): qty 60, 30d supply, fill #0

## 2021-09-15 MED ORDER — LEVOCETIRIZINE DIHYDROCHLORIDE 5 MG PO TABS
5.0000 mg | ORAL_TABLET | Freq: Every evening | ORAL | 0 refills | Status: DC
Start: 2021-09-15 — End: 2022-01-15
  Filled 2021-09-15: qty 90, 90d supply, fill #0
  Filled 2021-09-21: qty 30, 30d supply, fill #0
  Filled 2021-10-15: qty 30, 30d supply, fill #1
  Filled 2021-11-06 – 2021-12-14 (×2): qty 30, 30d supply, fill #2

## 2021-09-17 ENCOUNTER — Other Ambulatory Visit (HOSPITAL_COMMUNITY): Payer: Self-pay

## 2021-09-21 ENCOUNTER — Other Ambulatory Visit (HOSPITAL_COMMUNITY): Payer: Self-pay

## 2021-09-23 ENCOUNTER — Other Ambulatory Visit (HOSPITAL_COMMUNITY): Payer: Self-pay

## 2021-09-23 ENCOUNTER — Other Ambulatory Visit: Payer: Self-pay | Admitting: Internal Medicine

## 2021-09-23 ENCOUNTER — Ambulatory Visit (AMBULATORY_SURGERY_CENTER): Payer: BC Managed Care – PPO | Admitting: Gastroenterology

## 2021-09-23 ENCOUNTER — Encounter: Payer: Self-pay | Admitting: Gastroenterology

## 2021-09-23 ENCOUNTER — Telehealth: Payer: Self-pay

## 2021-09-23 VITALS — BP 125/72 | HR 74 | Temp 97.7°F | Resp 14 | Ht 62.0 in | Wt 137.0 lb

## 2021-09-23 DIAGNOSIS — R1030 Lower abdominal pain, unspecified: Secondary | ICD-10-CM | POA: Diagnosis not present

## 2021-09-23 DIAGNOSIS — R1013 Epigastric pain: Secondary | ICD-10-CM | POA: Diagnosis not present

## 2021-09-23 DIAGNOSIS — K648 Other hemorrhoids: Secondary | ICD-10-CM

## 2021-09-23 DIAGNOSIS — R197 Diarrhea, unspecified: Secondary | ICD-10-CM

## 2021-09-23 DIAGNOSIS — K449 Diaphragmatic hernia without obstruction or gangrene: Secondary | ICD-10-CM

## 2021-09-23 DIAGNOSIS — K5730 Diverticulosis of large intestine without perforation or abscess without bleeding: Secondary | ICD-10-CM | POA: Diagnosis not present

## 2021-09-23 DIAGNOSIS — K5000 Crohn's disease of small intestine without complications: Secondary | ICD-10-CM | POA: Diagnosis not present

## 2021-09-23 DIAGNOSIS — D122 Benign neoplasm of ascending colon: Secondary | ICD-10-CM | POA: Diagnosis not present

## 2021-09-23 MED ORDER — TRUE METRIX BLOOD GLUCOSE TEST VI STRP
ORAL_STRIP | 3 refills | Status: DC
  Filled 2021-09-23: qty 100, 25d supply, fill #0

## 2021-09-23 MED ORDER — SODIUM CHLORIDE 0.9 % IV SOLN: 500.0000 mL | Freq: Once | INTRAVENOUS | Status: DC

## 2021-09-23 NOTE — Op Note (Signed)
[place] ?Patient Name: [name] ?Procedure Date: 09/23/2021 11:15 AM ?MRN: [id number] ?Endoscopist: Mauri Pole , MD ?Age: 61 ?Referring MD:  ?Date of Birth: 02/26/1961 ?Gender: Female ?Account #: [id number] ?Procedure:                Colonoscopy ?Indications:              High risk colon cancer surveillance: Crohn's  ?                          colitis of 8 (or more) years duration with  ?                          one-third (or more) of the colon involved,  ?                          Incidental - Lower abdominal pain, Incidental -  ?                          Upper abdominal pain ?Medicines:                Monitored Anesthesia Care ?Procedure:                Pre-Anesthesia Assessment: ?                          - Prior to the procedure, a History and Physical  ?                          was performed, and patient medications and  ?                          allergies were reviewed. The patient's tolerance of  ?                          previous anesthesia was also reviewed. The risks  ?                          and benefits of the procedure and the sedation  ?                          options and risks were discussed with the patient.  ?                          All questions were answered, and informed consent  ?                          was obtained. Prior Anticoagulants: The patient has  ?                          taken no previous anticoagulant or antiplatelet  ?                          agents. ASA Grade Assessment: III - A patient with  ?  severe systemic disease. After reviewing the risks  ?                          and benefits, the patient was deemed in  ?                          satisfactory condition to undergo the procedure. ?                          After obtaining informed consent, the colonoscope  ?                          was passed under direct vision. Throughout the  ?                          procedure, the patient's blood pressure, pulse, and  ?                           oxygen saturations were monitored continuously. The  ?                          PCF-HQ190L Colonoscope was introduced through the  ?                          anus and advanced to the the terminal ileum, with  ?                          identification of the appendiceal orifice and IC  ?                          valve. The colonoscopy was performed without  ?                          difficulty. The patient tolerated the procedure  ?                          well. ?Scope In: 11:33:58 AM ?Scope Out: 37:90:24 AM ?Scope Withdrawal Time: 0 hours 10 minutes 44 seconds  ?Total Procedure Duration: 0 hours 12 minutes 59 seconds  ?Findings:                 The perianal and digital rectal examinations were  ?                          normal. ?                          A 5 mm polyp was found in the ascending colon. The  ?                          polyp was sessile. The polyp was removed with a  ?                          cold snare. Resection and retrieval were complete. ?  The Simple Endoscopic Score for Crohn's Disease was  ?                          determined based on the endoscopic appearance of  ?                          the mucosa in the following segments: ?                          - Ileum: Findings include large ulcers 0.5-2 cm in  ?                          size, less than 10% ulcerated surfaces, less than  ?                          50% of surfaces affected and no narrowings. Segment  ?                          score: 4. ?                          - Right Colon: Findings include no ulcers present,  ?                          no ulcerated surfaces, no affected surfaces and no  ?                          narrowings. Segment score: 0. ?                          - Transverse Colon: Findings include no ulcers  ?                          present, no ulcerated surfaces, no affected  ?                          surfaces and no narrowings. Segment score: 0. ?                           - Left Colon: Findings include no ulcers present,  ?                          no ulcerated surfaces, no affected surfaces and no  ?                          narrowings. Segment score: 0. ?                          - Rectum: Findings include no ulcers present, no  ?                          ulcerated surfaces, no affected surfaces and no  ?  narrowings. Segment score: 0. ?                          - Total SES-CD aggregate score: 4. Biopsies were  ?                          taken with a cold forceps for histology. ?                          A few small-mouthed diverticula were found in the  ?                          sigmoid colon. ?                          Non-bleeding external and internal hemorrhoids were  ?                          found during retroflexion. The hemorrhoids were  ?                          medium-sized. ?Complications:            No immediate complications. ?Estimated Blood Loss:     Estimated blood loss was minimal. ?Impression:               - One 5 mm polyp in the ascending colon, removed  ?                          with a cold snare. Resected and retrieved. ?                          - Simple Endoscopic Score for Crohn's Disease: 4,  ?                          mucosal inflammatory changes secondary to Crohn's  ?                          disease with ileitis. Biopsied. ?                          - Diverticulosis in the sigmoid colon. ?                          - Non-bleeding external and internal hemorrhoids. ?Recommendation:           - Patient has a contact number available for  ?                          emergencies. The signs and symptoms of potential  ?                          delayed complications were discussed with the  ?                          patient. Return to normal activities tomorrow.  ?  Written discharge instructions were provided to the  ?                          patient. ?                          - Resume previous  diet. ?                          - Continue present medications. ?                          - Await pathology results. ?                          - Repeat colonoscopy in 3 - 5 years for  ?                          surveillance based on pathology results. ?Mauri Pole, MD ?09/23/2021 11:55:55 AM ?This report has been signed electronically. ?

## 2021-09-23 NOTE — Progress Notes (Signed)
[facility] History and Physical ? ? ?Primary Care Physician:  Hoyt Koch, MD ? ? ?Reason for Procedure:  abdominal pain, Crohn's disease, diarrhea ? ?Plan:    EGD and colonoscopy with possible interventions as needed ? ? ? ? ?HPI: [name] is a very pleasant 61 y.o. female here for EGD and colonoscopy for further evaluation of diarrhea and abdominal pain, h/o crohn's disease currently on Entyvio. ? ?Please refer to office visit note 06/24/21 for additional details ? ? ?The risks and benefits as well as alternatives of endoscopic procedure(s) have been discussed and reviewed. All questions answered. The patient agrees to proceed. ? ? ? ?Past Medical History:  ?Diagnosis Date  ? Allergic rhinitis due to pollen   ? Allergy   ? Anxiety state, unspecified   ? Arthritis   ? Bronchitis   ? hx  ? CAD (coronary artery disease)   ? s/p NSTEMI in 11/18 treated with a DES to the proximal LAD  ? Calculus of gallbladder without mention of cholecystitis or obstruction   ? Crohn disease ([place])   ? Depressive disorder, not elsewhere classified   ? Diabetes mellitus without complication ([place])   ? diet controlled  ? Esophageal reflux   ? Fibromyalgia   ? Grade I diastolic dysfunction   ? Noted on ECHO  ? Headache   ? History of nuclear stress test   ? ETT-Myoview 10/17: EF 55%, normal perfusion; Low Risk // Nuclear stress test 05/2018:  EF 63, normal perfusion; Low Risk  ? Myalgia and myositis, unspecified   ? NSTEMI (non-ST elevated myocardial infarction) ([place]) 04/15/2017  ? Other and unspecified hyperlipidemia   ? Palpitations   ? Pneumonia 15  ? hx  ? S/P angioplasty with stent 04/18/17 with DES to pLAD 04/19/2017  ? Seizures ([place])   ? 1 seizure as a teenager   ? Unspecified essential hypertension   ? Vitamin B deficiency   ? ? ?Past Surgical History:  ?Procedure Laterality Date  ? BIOPSY  12/05/2018  ? Procedure: BIOPSY;  Surgeon: Mauri Pole, MD;  Location: [facility];  Service: Endoscopy;;  ?  CHOLECYSTECTOMY N/A 09/19/2014  ? Procedure: LAPAROSCOPIC CHOLECYSTECTOMY WITH INTRAOPERATIVE CHOLANGIOGRAM;  Surgeon: Donnie Mesa, MD;  Location: [facility];  Service: General;  Laterality: N/A;  ? COLONOSCOPY WITH PROPOFOL N/A 12/05/2018  ? Procedure: COLONOSCOPY WITH PROPOFOL;  Surgeon: Mauri Pole, MD;  Location: [facility];  Service: Endoscopy;  Laterality: N/A;  chromoendoscopy  ? CORONARY STENT INTERVENTION N/A 04/18/2017  ? Procedure: CORONARY STENT INTERVENTION;  Surgeon: Martinique, Peter M, MD;  Location: [facility];  Service: Cardiovascular;  Laterality: N/A;  ? ESOPHAGOGASTRODUODENOSCOPY    ? EXPLORATORY LAPAROTOMY    ? for endometriosis  ? INTRAVASCULAR PRESSURE WIRE/FFR STUDY N/A 04/18/2017  ? Procedure: INTRAVASCULAR PRESSURE WIRE/FFR STUDY;  Surgeon: Martinique, Peter M, MD;  Location: [facility];  Service: Cardiovascular;  Laterality: N/A;  ? LEFT HEART CATH AND CORONARY ANGIOGRAPHY N/A 04/18/2017  ? Procedure: LEFT HEART CATH AND CORONARY ANGIOGRAPHY;  Surgeon: Martinique, Peter M, MD;  Location: [facility];  Service: Cardiovascular;  Laterality: N/A;  ? POLYPECTOMY  12/05/2018  ? Procedure: POLYPECTOMY;  Surgeon: Mauri Pole, MD;  Location: [facility];  Service: Endoscopy;;  ? SUBMUCOSAL LIFTING INJECTION  12/05/2018  ? Procedure: SUBMUCOSAL LIFTING INJECTION;  Surgeon: Mauri Pole, MD;  Location: [facility];  Service: Endoscopy;;  ? TONSILLECTOMY AND ADENOIDECTOMY    ? TUBAL LIGATION    ?  BILATERAL  ? ? ?Prior to Admission medications   ?Medication Sig Start Date End Date Taking? Authorizing Provider  ?acetaminophen (TYLENOL) 650 MG CR tablet Take 650 mg by mouth every 8 (eight) hours as needed for pain.   Yes [provider]  ?aspirin EC 81 MG tablet Take 81 mg by mouth daily. Swallow whole.   Yes [provider]  ?Blood Glucose Monitoring Suppl (FREESTYLE LITE) w/Device KIT Use to check blood sugar up to 4 times a day as directed 03/10/21  Yes  Hoyt Koch, MD  ?busPIRone (BUSPAR) 10 MG tablet Take 1 tablet (10 mg total) by mouth 2 (two) times daily. 12/05/20  Yes   ?colestipol (COLESTID) 1 g tablet Take 1 tablet by mouth daily. 09/11/21  Yes Mauri Pole, MD  ?Continuous Blood Gluc Receiver (FREESTYLE LIBRE 14 DAY READER) DEVI Use to monitor sugars 04/03/21  Yes Hoyt Koch, MD  ?Continuous Blood Gluc Sensor (FREESTYLE LIBRE 14 DAY SENSOR) MISC Use to monitor sugars 04/03/21  Yes Hoyt Koch, MD  ?Continuous Blood Gluc Sensor ([place]) MISC Use for blood sugar monitoring 04/03/21  Yes Hoyt Koch, MD  ?cyanocobalamin (,VITAMIN B-12,) 1000 MCG/ML injection 1 ML  B12 injection every 2 weeks for 3 months then decrease to 1 B12 injection to once a month 12/16/20  Yes Adarryl Goldammer, Venia Minks, MD  ?dicyclomine (BENTYL) 20 MG tablet Take 1 tablet (20 mg total) by mouth every 6 (six) hours. 06/02/21  Yes Gatha Mayer, MD  ?DULoxetine (CYMBALTA) 60 MG capsule Take 1 capsule (60 mg total) by mouth daily. 01/19/21  Yes Hoyt Koch, MD  ?enalapril (VASOTEC) 10 MG tablet Take 1 tablet (10 mg total) by mouth daily. Patient needs appointment for any future refills. Please call office at [phone] to schedule appointment. 1st attempt. 08/27/21  Yes Freada Bergeron, MD  ?fenofibrate (TRICOR) 145 MG tablet TAKE ONE TABLET BY MOUTH ONCE DAILY 01/05/21  Yes Freada Bergeron, MD  ?furosemide (LASIX) 40 MG tablet Take 1 tablet (40 mg total) by mouth daily as needed for fluid or edema. 11/27/19  Yes Dorothy Spark, MD  ?glimepiride (AMARYL) 2 MG tablet Take 1 tablet (2 mg total) by mouth daily before breakfast. 08/14/21  Yes Hoyt Koch, MD  ?glucose blood (TRUE METRIX BLOOD GLUCOSE TEST) test strip Use to check blood sugar up to 4 times a day as directed. 09/23/21  Yes Hoyt Koch, MD  ?insulin glargine-yfgn (SEMGLEE, YFGN,) 100 UNIT/ML Pen Inject 48-55 Units into the skin daily.  06/23/21  Yes Hoyt Koch, MD  ?Insulin Pen Needle (PEN NEEDLES 3/16") 31G X 5 MM MISC Use daily 03/12/21  Yes Hoyt Koch, MD  ?Lancets (FREESTYLE) lancets Use to check blood sugar up to 4 times a day as directed 03/10/21  Yes Hoyt Koch, MD  ?levocetirizine (XYZAL) 5 MG tablet Take 1 tablet  by mouth every evening. 09/15/21  Yes Hoyt Koch, MD  ?metFORMIN (GLUCOPHAGE-XR) 500 MG 24 hr tablet Take 2 tablets by mouth daily. 09/15/21  Yes Hoyt Koch, MD  ?metoprolol succinate (TOPROL-XL) 25 MG 24 hr tablet Take 1 & 1/2 tablets (37.5 mg total) by mouth daily. 02/25/21  Yes Loel Dubonnet, NP  ?montelukast (SINGULAIR) 10 MG tablet Take 1 tablet by mouth every evening. 05/01/21  Yes Hoyt Koch, MD  ?Multiple Vitamins-Minerals (MULTIVITAMIN WITH MINERALS) tablet Take 1 tablet by mouth daily.   Yes [provider]  ?olopatadine (PATANOL) 0.1 % ophthalmic solution Place 1 drop into both eyes 2 (two) times daily. 08/14/21  Yes Hoyt Koch, MD  ?pantoprazole (PROTONIX) 40 MG tablet Take 1 tablet (40 mg total) by mouth daily. 03/06/21  Yes Mauri Pole, MD  ?Probiotic Product (PROBIOTIC DAILY PO) Take 1 capsule by mouth daily.   Yes [provider]  ?sodium chloride (OCEAN) 0.65 % SOLN nasal spray Place 1 spray into both nostrils daily as needed for congestion.   Yes [provider]  ?VASCEPA 1 g capsule Take 2 capsules by mouth 2 times daily. 01/07/21  Yes Freada Bergeron, MD  ?Vitamin D3 (VITAMIN D) 25 MCG tablet Take 1,000 Units by mouth at bedtime.   Yes [provider]  ?albuterol (PROVENTIL) (2.5 MG/3ML) 0.083% nebulizer solution Take 2.5 mg by nebulization every 4 (four) hours as needed for wheezing or shortness of breath.     [provider]  ?albuterol (VENTOLIN HFA) 108 (90 Base) MCG/ACT inhaler Inhale 1-2 puffs into the lungs every 6 (six) hours as needed for wheezing or shortness of breath.     [provider]  ?budesonide (ENTOCORT EC) 3 MG 24 hr capsule Take 3 capsules (9 mg total) by mouth daily. 07/08/21   Mauri Pole, MD  ?cholestyramine light (PREVALITE) 4 g packet Take

## 2021-09-23 NOTE — Op Note (Signed)
[place] ?Patient Name: [name] ?Procedure Date: 09/23/2021 11:22 AM ?MRN: [id number] ?Endoscopist: Mauri Pole , MD ?Age: 61 ?Referring MD:  ?Date of Birth: 1960/09/01 ?Gender: Female ?Account #: [id number] ?Procedure:                Upper GI endoscopy ?Indications:              Epigastric abdominal pain ?Medicines:                Monitored Anesthesia Care ?Procedure:                Pre-Anesthesia Assessment: ?                          - Prior to the procedure, a History and Physical  ?                          was performed, and patient medications and  ?                          allergies were reviewed. The patient's tolerance of  ?                          previous anesthesia was also reviewed. The risks  ?                          and benefits of the procedure and the sedation  ?                          options and risks were discussed with the patient.  ?                          All questions were answered, and informed consent  ?                          was obtained. Prior Anticoagulants: The patient has  ?                          taken no previous anticoagulant or antiplatelet  ?                          agents. ASA Grade Assessment: III - A patient with  ?                          severe systemic disease. After reviewing the risks  ?                          and benefits, the patient was deemed in  ?                          satisfactory condition to undergo the procedure. ?                          After obtaining informed consent, the endoscope was  ?  passed under direct vision. Throughout the  ?                          procedure, the patient's blood pressure, pulse, and  ?                          oxygen saturations were monitored continuously. The  ?                          Endoscope was introduced through the mouth, and  ?                          advanced to the second part of duodenum. The upper  ?                          GI endoscopy was  accomplished without difficulty.  ?                          The patient tolerated the procedure well. ?Scope In: ?Scope Out: ?Findings:                 The Z-line was regular and was found 35 cm from the  ?                          incisors. ?                          A small hiatal hernia was present. ?                          The stomach was normal. ?                          The cardia and gastric fundus were normal on  ?                          retroflexion. ?                          The examined duodenum was normal. ?Complications:            No immediate complications. ?Estimated Blood Loss:     Estimated blood loss was minimal. ?Impression:               - Z-line regular, 35 cm from the incisors. ?                          - Small hiatal hernia. ?                          - Normal stomach. ?                          - Normal examined duodenum. ?                          - No specimens collected. ?Recommendation:           -  Patient has a contact number available for  ?                          emergencies. The signs and symptoms of potential  ?                          delayed complications were discussed with the  ?                          patient. Return to normal activities tomorrow.  ?                          Written discharge instructions were provided to the  ?                          patient. ?                          - Resume previous diet. ?                          - Continue present medications. ?                          - See the other procedure note for documentation of  ?                          additional recommendations. ?Mauri Pole, MD ?09/23/2021 11:57:30 AM ?This report has been signed electronically. ?

## 2021-09-23 NOTE — Telephone Encounter (Signed)
Refill has been sent to the pt's pharmacy  

## 2021-09-23 NOTE — Telephone Encounter (Signed)
Pt is requesting a refill on: ?glucose blood (TRUE METRIX BLOOD GLUCOSE TEST) test strip ? ?Pharmacy: ?[facility] ? ?LOV 08/14/21 ? ? ?Please advise ?

## 2021-09-23 NOTE — Progress Notes (Signed)
Pt's states no medical or surgical changes since previsit or office visit. 

## 2021-09-23 NOTE — Patient Instructions (Signed)
YOU HAD AN ENDOSCOPIC PROCEDURE TODAY AT THE [facility]:   Refer to the procedure report that was given to you for any specific questions about what was found during the examination.  If the procedure report does not answer your questions, please call your gastroenterologist to clarify.  If you requested that your care partner not be given the details of your procedure findings, then the procedure report has been included in a sealed envelope for you to review at your convenience later.  YOU SHOULD EXPECT: Some feelings of bloating in the abdomen. Passage of more gas than usual.  Walking can help get rid of the air that was put into your GI tract during the procedure and reduce the bloating. If you had a lower endoscopy (such as a colonoscopy or flexible sigmoidoscopy) you may notice spotting of blood in your stool or on the toilet paper. If you underwent a bowel prep for your procedure, you may not have a normal bowel movement for a few days.  Please Note:  You might notice some irritation and congestion in your nose or some drainage.  This is from the oxygen used during your procedure.  There is no need for concern and it should clear up in a day or so.  SYMPTOMS TO REPORT IMMEDIATELY:   Following lower endoscopy (colonoscopy or flexible sigmoidoscopy):  Excessive amounts of blood in the stool  Significant tenderness or worsening of abdominal pains  Swelling of the abdomen that is new, acute  Fever of 100F or higher   Following upper endoscopy (EGD)  Vomiting of blood or coffee ground material  New chest pain or pain under the shoulder blades  Painful or persistently difficult swallowing  New shortness of breath  Fever of 100F or higher  Black, tarry-looking stools  For urgent or emergent issues, a gastroenterologist can be reached at any hour by calling [phone]. Do not use MyChart messaging for urgent concerns.    DIET:  We do recommend a small meal at first, but  then you may proceed to your regular diet.  Drink plenty of fluids but you should avoid alcoholic beverages for 24 hours.  ACTIVITY:  You should plan to take it easy for the rest of today and you should NOT DRIVE or use heavy machinery until tomorrow (because of the sedation medicines used during the test).    FOLLOW UP: Our staff will call the number listed on your records 48-72 hours following your procedure to check on you and address any questions or concerns that you may have regarding the information given to you following your procedure. If we do not reach you, we will leave a message.  We will attempt to reach you two times.  During this call, we will ask if you have developed any symptoms of COVID 19. If you develop any symptoms (ie: fever, flu-like symptoms, shortness of breath, cough etc.) before then, please call [phone].  If you test positive for Covid 19 in the 2 weeks post procedure, please call and report this information to us.    If any biopsies were taken you will be contacted by phone or by letter within the next 1-3 weeks.  Please call us at [phone] if you have not heard about the biopsies in 3 weeks.    SIGNATURES/CONFIDENTIALITY: You and/or your care partner have signed paperwork which will be entered into your electronic medical record.  These signatures attest to the fact that that the information above on   your After Visit Summary has been reviewed and is understood.  Full responsibility of the confidentiality of this discharge information lies with you and/or your care-partner. 

## 2021-09-23 NOTE — Progress Notes (Signed)
VSS, transported to PACU °

## 2021-09-23 NOTE — Progress Notes (Signed)
Called to room to assist during endoscopic procedure.  Patient ID and intended procedure confirmed with present staff. Received instructions for my participation in the procedure from the performing physician.  

## 2021-09-24 ENCOUNTER — Other Ambulatory Visit (HOSPITAL_COMMUNITY): Payer: Self-pay

## 2021-09-25 ENCOUNTER — Telehealth: Payer: Self-pay

## 2021-09-25 ENCOUNTER — Encounter: Payer: Self-pay | Admitting: Gastroenterology

## 2021-09-25 NOTE — Telephone Encounter (Signed)
?  Follow up Call- ? ? ?  09/23/2021  ? 11:05 AM  ?Call back number  ?Post procedure Call Back phone  # [phone]  ?Permission to leave phone message Yes  ?  ? ?Patient questions: ? ?Do you have a fever, pain , or abdominal swelling? No. ?Pain Score  0 * ? ?Have you tolerated food without any problems? Yes.   ? ?Have you been able to return to your normal activities? Yes.   ? ?Do you have any questions about your discharge instructions: ?Diet   No. ?Medications  No. ?Follow up visit  No. ? ?Do you have questions or concerns about your Care? No. ? ?Actions: ?* If pain score is 4 or above: ?No action needed, pain <4. ? ? ?

## 2021-09-25 NOTE — Telephone Encounter (Signed)
?  Follow up Call- ? ? ?  09/23/2021  ? 11:05 AM  ?Call back number  ?Post procedure Call Back phone  # [phone]  ?Permission to leave phone message Yes  ?  ?First follow up call, LVM ? ? ?

## 2021-10-01 ENCOUNTER — Other Ambulatory Visit (HOSPITAL_COMMUNITY): Payer: Self-pay

## 2021-10-09 ENCOUNTER — Ambulatory Visit: Payer: BC Managed Care – PPO | Admitting: Gastroenterology

## 2021-10-13 ENCOUNTER — Ambulatory Visit: Payer: BC Managed Care – PPO | Admitting: Gastroenterology

## 2021-10-13 ENCOUNTER — Other Ambulatory Visit (INDEPENDENT_AMBULATORY_CARE_PROVIDER_SITE_OTHER): Payer: BC Managed Care – PPO

## 2021-10-13 ENCOUNTER — Encounter: Payer: Self-pay | Admitting: Gastroenterology

## 2021-10-13 VITALS — BP 146/68 | HR 70 | Ht 62.0 in | Wt 136.0 lb

## 2021-10-13 DIAGNOSIS — K760 Fatty (change of) liver, not elsewhere classified: Secondary | ICD-10-CM

## 2021-10-13 DIAGNOSIS — K50818 Crohn's disease of both small and large intestine with other complication: Secondary | ICD-10-CM | POA: Diagnosis not present

## 2021-10-13 DIAGNOSIS — K50019 Crohn's disease of small intestine with unspecified complications: Secondary | ICD-10-CM

## 2021-10-13 DIAGNOSIS — K5000 Crohn's disease of small intestine without complications: Secondary | ICD-10-CM

## 2021-10-13 LAB — HEPATIC FUNCTION PANEL
ALT: 55 U/L — ABNORMAL HIGH (ref 0–35)
AST: 40 U/L — ABNORMAL HIGH (ref 0–37)
Albumin: 4.1 g/dL (ref 3.5–5.2)
Alkaline Phosphatase: 96 U/L (ref 39–117)
Bilirubin, Direct: 0.1 mg/dL (ref 0.0–0.3)
Total Bilirubin: 0.4 mg/dL (ref 0.2–1.2)
Total Protein: 6.6 g/dL (ref 6.0–8.3)

## 2021-10-13 LAB — HIGH SENSITIVITY CRP: CRP, High Sensitivity: 10.78 mg/L — ABNORMAL HIGH (ref 0.000–5.000)

## 2021-10-13 NOTE — Patient Instructions (Signed)
Your provider has requested that you go to the basement level for lab work before leaving today. Press "B" on the elevator. The lab is located at the first door on the left as you exit the elevator.  ? ?STOP Budesonide ? ?Use Colestid as needed, avoid taking it  with-in 2-3 hours of other medications ? ?Switch to [employer] ( Will need insurance approval)   ? ?Due to recent changes in healthcare laws, you may see the results of your imaging and laboratory studies on MyChart before your provider has had a chance to review them.  We understand that in some cases there may be results that are confusing or concerning to you. Not all laboratory results come back in the same time frame and the provider may be waiting for multiple results in order to interpret others.  Please give [place] 48 hours in order for your provider to thoroughly review all the results before contacting the office for clarification of your results.   ? ?If you are age 50 or older, your body mass index should be between 23-30. Your Body mass index is 24.87 kg/m?Marland Kitchen If this is out of the aforementioned range listed, please consider follow up with your Primary Care Provider. ? ?If you are age 30 or younger, your body mass index should be between 19-25. Your Body mass index is 24.87 kg/m?Marland Kitchen If this is out of the aformentioned range listed, please consider follow up with your Primary Care Provider.  ? ?________________________________________________________ ? ?The [facility] providers would like to encourage you to use [facility] to communicate with providers for non-urgent requests or questions.  Due to long hold times on the telephone, sending your provider a message by [facility] may be a faster and more efficient way to get a response.  Please allow 48 business hours for a response.  Please remember that this is for non-urgent requests.  ?_______________________________________________________  ? ?I appreciate the  opportunity to care for you ? ?Thank You   ? ?Harl Bowie , MD  ?

## 2021-10-13 NOTE — Progress Notes (Signed)
? ?       ? ?[name]    [id number]    1960/08/26 ? ?Primary Care Physician:Crawford, Real Cons, MD ? ?Referring Physician: Hoyt Koch, MD ?[address] ?[place],  [place] [address] ? ? ?Chief complaint: Crohn's disease ? ?HPI: ? ?61 year old very pleasant female with history of Crohn's disease on Entyvio for maintenance therapy here for follow-up visit for crohn's disease ?  ?She is having intermittent on and off diarrhea and also has intermittent abdominal discomfort, generalized more predominant in the epigastric region and lower abdomen. ?She has sensation of indigestion, dyspepsia and nausea.  No vomiting, melena or rectal bleeding. ?She started back on budesonide after colonoscopy because of diarrhea.  She is also using colestipol.  Her last A1c was >11 ? ?CRP elevated to 55,000 in May 2022 and was persistently elevated on Entyvio and budesonide ?  ?Colonoscopy December 05, 2018: Terminal ileal biopsies consistent with Crohn's disease.  IC valve polypoid lesion status post EMR, biopsies consistent with IBD negative for dysplasia or adenomatous tissue ?5 mm polyp, tubular adenoma removed from transverse colon otherwise normal colon on chromoendoscopy ?  ?Colonoscopy July 07, 2018 ?Nodular TI biopsied ?Localized nodular polypoid mucosa in IC valve biopsied, ?  Adenoma on pathology report, requested additional review is pending. ?  ?EGD 08/22/2009: Esophagitis otherwise unremarkable exam ?  ?  ? MRI pelvis February 14, 2018 showed grade 1 simple linear intersphincteric perianal fistula without associated abscess.  Mild segmental wall thickening and luminal narrowing in the distal ileum suggestive of mild active Crohn's ileitis.  Mild chronic wall thickening in the terminal ileum without active inflammatory changes. ?  ?Relevant GI history: ?Crohn's disease with predominant small bowel involvement diagnosed in 1991.  Initially was managed with prednisone, had significant side effects.   Subsequently was in clinical remission on 6-MP.  6-MP was discontinued in 2017 due to persistently elevated transaminases, have improved after discontinuing 6-MP.  She was started on Humira, clinically failed with persistent symptoms.  Had undetectable drug trough with elevated antibodies.  Started on budesonide 9 mg daily and taper down to 3 mg daily in 2018.  Developed rectal pain and noted to have perianal fistula on MRI pelvis September 2019. ?  ?Relevant other PMH ?She had NSTEMI in November 2018, status post coronary stent placement (DES) ? ? ?Outpatient Encounter Medications as of 10/13/2021  ?Medication Sig  ? acetaminophen (TYLENOL) 650 MG CR tablet Take 650 mg by mouth every 8 (eight) hours as needed for pain.  ? albuterol (PROVENTIL) (2.5 MG/3ML) 0.083% nebulizer solution Take 2.5 mg by nebulization every 4 (four) hours as needed for wheezing or shortness of breath.   ? albuterol (VENTOLIN HFA) 108 (90 Base) MCG/ACT inhaler Inhale 1-2 puffs into the lungs every 6 (six) hours as needed for wheezing or shortness of breath.  ? aspirin EC 81 MG tablet Take 81 mg by mouth daily. Swallow whole.  ? Blood Glucose Monitoring Suppl (FREESTYLE LITE) w/Device KIT Use to check blood sugar up to 4 times a day as directed  ? budesonide (ENTOCORT EC) 3 MG 24 hr capsule Take 3 capsules (9 mg total) by mouth daily.  ? busPIRone (BUSPAR) 10 MG tablet Take 1 tablet (10 mg total) by mouth 2 (two) times daily.  ? cholestyramine light (PREVALITE) 4 g packet Take 1 packet (4 g total) by mouth 2 (two) times daily. (Patient not taking: Reported on 09/23/2021)  ? colestipol (COLESTID) 1 g tablet Take 1 tablet  by mouth daily.  ? Continuous Blood Gluc Receiver (FREESTYLE LIBRE 14 DAY READER) DEVI Use to monitor sugars  ? Continuous Blood Gluc Sensor (FREESTYLE LIBRE 14 DAY SENSOR) MISC Use to monitor sugars  ? Continuous Blood Gluc Sensor ([place]) MISC Use for blood sugar monitoring  ? cyanocobalamin (,VITAMIN  B-12,) 1000 MCG/ML injection 1 ML  B12 injection every 2 weeks for 3 months then decrease to 1 B12 injection to once a month  ? dicyclomine (BENTYL) 20 MG tablet Take 1 tablet (20 mg total) by mouth every 6 (six) hours.  ? DULoxetine (CYMBALTA) 30 MG capsule Take 1 capsule by mouth daily for 14 days. (Patient not taking: Reported on 09/23/2021)  ? DULoxetine (CYMBALTA) 60 MG capsule Take 1 capsule (60 mg total) by mouth daily.  ? enalapril (VASOTEC) 10 MG tablet Take 1 tablet (10 mg total) by mouth daily. Patient needs appointment for any future refills. Please call office at [phone] to schedule appointment. 1st attempt.  ? EPINEPHrine 0.3 mg/0.3 mL IJ SOAJ injection Use as directed (Patient not taking: Reported on 09/23/2021)  ? Evolocumab (REPATHA SURECLICK) 329 MG/ML SOAJ Inject the contents of 1 pen into the skin every 14 days as directed.  ? fenofibrate (TRICOR) 145 MG tablet TAKE ONE TABLET BY MOUTH ONCE DAILY  ? furosemide (LASIX) 40 MG tablet Take 1 tablet (40 mg total) by mouth daily as needed for fluid or edema.  ? glimepiride (AMARYL) 2 MG tablet Take 1 tablet (2 mg total) by mouth daily before breakfast.  ? glucose blood (TRUE METRIX BLOOD GLUCOSE TEST) test strip Use to check blood sugar up to 4 times a day as directed.  ? insulin glargine-yfgn (SEMGLEE, YFGN,) 100 UNIT/ML Pen Inject 48-55 Units into the skin daily.  ? Insulin Pen Needle (PEN NEEDLES 3/16") 31G X 5 MM MISC Use daily  ? Lancets (FREESTYLE) lancets Use to check blood sugar up to 4 times a day as directed  ? levocetirizine (XYZAL) 5 MG tablet Take 1 tablet  by mouth every evening.  ? metFORMIN (GLUCOPHAGE-XR) 500 MG 24 hr tablet Take 2 tablets by mouth daily.  ? metoprolol succinate (TOPROL-XL) 25 MG 24 hr tablet Take 1 & 1/2 tablets (37.5 mg total) by mouth daily.  ? montelukast (SINGULAIR) 10 MG tablet Take 1 tablet by mouth every evening.  ? Multiple Vitamins-Minerals (MULTIVITAMIN WITH MINERALS) tablet Take 1 tablet by mouth daily.   ? nitroGLYCERIN (NITROSTAT) 0.4 MG SL tablet Place 1 tablet (0.4 mg total) under the tongue every 5 (five) minutes as needed for chest pain.  Call 911 if no relief after 1st dose. (Patient not taking: Reported on 09/23/2021)  ? olopatadine (PATANOL) 0.1 % ophthalmic solution Place 1 drop into both eyes 2 (two) times daily.  ? ondansetron (ZOFRAN ODT) 4 MG disintegrating tablet Take 1 tablet (4 mg total) by mouth every 4 (four) hours as needed for nausea or vomiting.  ? pantoprazole (PROTONIX) 40 MG tablet Take 1 tablet (40 mg total) by mouth daily.  ? Probiotic Product (PROBIOTIC DAILY PO) Take 1 capsule by mouth daily.  ? sodium chloride (OCEAN) 0.65 % SOLN nasal spray Place 1 spray into both nostrils daily as needed for congestion.  ? sucralfate (CARAFATE) 1 g tablet Take 1 tablet (1 g total) by mouth 3 (three) times daily before meals. (Patient not taking: Reported on 09/23/2021)  ? VASCEPA 1 g capsule Take 2 capsules by mouth 2 times daily.  ? vedolizumab (ENTYVIO) 300  MG injection Inject 300 mg into the vein every 8 (eight) weeks. After the standard induction  ? Vitamin D3 (VITAMIN D) 25 MCG tablet Take 1,000 Units by mouth at bedtime.  ? [DISCONTINUED] EPIPEN 2-PAK 0.3 MG/0.3ML SOAJ injection Inject 0.3 mg into the muscle as needed for anaphylaxis. Reported on 06/27/2015  ? [DISCONTINUED] insulin glargine (LANTUS) 100 UNIT/ML injection Inject 0.48 mLs (48 Units total) into the skin at bedtime.  ? ?No facility-administered encounter medications on file as of 10/13/2021.  ? ? ?Allergies as of 10/13/2021 - Review Complete 09/23/2021  ?Allergen Reaction Noted  ? Lipitor [atorvastatin] Other (See Comments) 03/28/2013  ? Remicade [infliximab]    ? Ticagrelor  09/16/2017  ? Rosuvastatin Other (See Comments) 01/25/2019  ? ? ?Past Medical History:  ?Diagnosis Date  ? Allergic rhinitis due to pollen   ? Allergy   ? Anxiety state, unspecified   ? Arthritis   ? Bronchitis   ? hx  ? CAD (coronary artery disease)   ? s/p  NSTEMI in 11/18 treated with a DES to the proximal LAD  ? Calculus of gallbladder without mention of cholecystitis or obstruction   ? Crohn disease ([place])   ? Depressive disorder, not elsewhere classified   ? Diabet

## 2021-10-15 ENCOUNTER — Other Ambulatory Visit: Payer: Self-pay

## 2021-10-15 ENCOUNTER — Other Ambulatory Visit (HOSPITAL_COMMUNITY): Payer: Self-pay

## 2021-10-15 MED ORDER — ENALAPRIL MALEATE 10 MG PO TABS
10.0000 mg | ORAL_TABLET | Freq: Every day | ORAL | 1 refills | Status: DC
Start: 2021-10-15 — End: 2021-12-07
  Filled 2021-10-15: qty 30, 30d supply, fill #0
  Filled 2021-11-19: qty 30, 30d supply, fill #1

## 2021-10-16 LAB — QUANTIFERON-TB GOLD PLUS
Mitogen-NIL: >10 IU/mL
NIL: 0.02 IU/mL
QuantiFERON-TB Gold Plus: NEGATIVE
TB1-NIL: 0 IU/mL
TB2-NIL: 0 IU/mL

## 2021-10-19 ENCOUNTER — Other Ambulatory Visit (HOSPITAL_COMMUNITY): Payer: Self-pay

## 2021-10-21 ENCOUNTER — Telehealth: Payer: Self-pay | Admitting: Gastroenterology

## 2021-10-21 NOTE — Telephone Encounter (Signed)
Inbound call from [place] at [facility] wanting to discuss injection for patient. Pleas advise.   [phone]  Ext [phone]

## 2021-10-21 NOTE — Telephone Encounter (Signed)
[facility] can do the induction of Skyrizi and the patient do her self-injections. Please give me the prescription and SIG. I will print and fax to [facility]. Thanks

## 2021-10-22 ENCOUNTER — Other Ambulatory Visit: Payer: Self-pay

## 2021-10-22 MED ORDER — SKYRIZI 360 MG/2.4ML ~~LOC~~ SOCT: SUBCUTANEOUS | 7 refills | Status: DC

## 2021-10-22 MED ORDER — SKYRIZI 600 MG/10ML IV SOLN: INTRAVENOUS | 0 refills | Status: DC

## 2021-10-22 NOTE — Telephone Encounter (Signed)
She will need Skyrizi IV induction dose 638m at Week 0, 4 &8 followed by subq 3663mat week 12 then every 8 weeks for maintenance. Thanks

## 2021-10-22 NOTE — Telephone Encounter (Signed)
Prescription for IV infusion and self administered cartridges faxed to  [facility] to the attention of "Nil."

## 2021-11-05 ENCOUNTER — Telehealth: Payer: Self-pay | Admitting: Gastroenterology

## 2021-11-05 NOTE — Telephone Encounter (Signed)
Patient called and stated that she is scheduled to have an infusion tomorrow but there is a problem with her insurance covering the medication Dr. Silverio Decamp wanted her to have.  Her rheumatologist has requested a phone call from Dr. Silverio Decamp to discuss.  The phone number is [phone].  Thank you.

## 2021-11-05 NOTE — Telephone Encounter (Signed)
The [place] was denied and is being appealed.  The Entyvio needs new PA. The patient is due an infusion.

## 2021-11-06 ENCOUNTER — Other Ambulatory Visit (HOSPITAL_BASED_OUTPATIENT_CLINIC_OR_DEPARTMENT_OTHER): Payer: Self-pay | Admitting: Family

## 2021-11-06 ENCOUNTER — Other Ambulatory Visit: Payer: Self-pay | Admitting: Gastroenterology

## 2021-11-06 ENCOUNTER — Other Ambulatory Visit (HOSPITAL_COMMUNITY): Payer: Self-pay

## 2021-11-06 ENCOUNTER — Other Ambulatory Visit: Payer: Self-pay | Admitting: Internal Medicine

## 2021-11-06 DIAGNOSIS — I25118 Atherosclerotic heart disease of native coronary artery with other forms of angina pectoris: Secondary | ICD-10-CM

## 2021-11-06 MED ORDER — COLESTIPOL HCL 1 G PO TABS
1.0000 g | ORAL_TABLET | Freq: Every day | ORAL | 1 refills | Status: DC
  Filled 2021-11-06: qty 30, 30d supply, fill #0
  Filled 2021-12-07: qty 30, 30d supply, fill #1
  Filled 2021-12-14 – 2022-01-15 (×2): qty 30, 30d supply, fill #2
  Filled 2022-02-02: qty 10, 10d supply, fill #3
  Filled 2022-02-24: qty 10, 10d supply, fill #4
  Filled 2022-03-09: qty 30, 30d supply, fill #5
  Filled 2022-04-08: qty 30, 30d supply, fill #6
  Filled 2022-05-08: qty 10, 10d supply, fill #7

## 2021-11-06 MED ORDER — PANTOPRAZOLE SODIUM 40 MG PO TBEC
40.0000 mg | DELAYED_RELEASE_TABLET | Freq: Every day | ORAL | 0 refills | Status: DC
  Filled 2021-11-06: qty 30, 30d supply, fill #0
  Filled 2021-12-14: qty 30, 30d supply, fill #1
  Filled 2022-01-15: qty 30, 30d supply, fill #2
  Filled 2022-02-02: qty 30, 30d supply, fill #3
  Filled 2022-03-22: qty 30, 30d supply, fill #4
  Filled 2022-04-23: qty 30, 30d supply, fill #5
  Filled 2022-05-18: qty 30, 30d supply, fill #6
  Filled 2022-06-29 – 2022-07-05 (×2): qty 30, 30d supply, fill #7

## 2021-11-06 MED ORDER — METFORMIN HCL ER 500 MG PO TB24
1000.0000 mg | ORAL_TABLET | Freq: Every day | ORAL | 0 refills | Status: DC
  Filled 2021-11-06: qty 60, 30d supply, fill #0

## 2021-11-06 MED ORDER — METOPROLOL SUCCINATE ER 25 MG PO TB24
37.5000 mg | ORAL_TABLET | Freq: Every day | ORAL | 0 refills | Status: DC
  Filled 2021-11-06: qty 135, 90d supply, fill #0

## 2021-11-06 NOTE — Telephone Encounter (Signed)
Rx(s) sent to pharmacy electronically.  

## 2021-11-07 ENCOUNTER — Other Ambulatory Visit (HOSPITAL_COMMUNITY): Payer: Self-pay

## 2021-11-10 NOTE — Telephone Encounter (Signed)
Okay, please check if we need to do anything different or she needs to fail any other medications because she has failed anti-TNF in the past.  Thank you

## 2021-11-16 NOTE — Telephone Encounter (Signed)
Called Sinclair Ship at [facility]. No answer. Left a message asking for an update on the bridging program for [employer].

## 2021-11-18 NOTE — Telephone Encounter (Signed)
Spoke with Otila Kluver at [facility].  The appeal letter cannot be located. Discussed the letter. Per her request, I have expanded the text to include more of the relevant health information. Faxed the letter back to [place].

## 2021-11-19 ENCOUNTER — Other Ambulatory Visit (HOSPITAL_COMMUNITY): Payer: Self-pay

## 2021-11-20 ENCOUNTER — Other Ambulatory Visit (HOSPITAL_COMMUNITY): Payer: Self-pay

## 2021-11-26 NOTE — Telephone Encounter (Signed)
Appeal for [name] was denied by the patient's insurance. [name] has been contacted and the [employer] application has been submitted.

## 2021-12-03 NOTE — Progress Notes (Deleted)
Cardiology Office Note:    Date:  12/03/2021   ID:  [name], DOB [birth year], MRN [id number]  PCP:  Hoyt Koch, MD   [place]  Cardiologist:  Freada Bergeron, MD  Advanced Practice Provider:  No care team member to display Electrophysiologist:  None  :[id number]}   Referring MD: Hoyt Koch, *     History of Present Illness:    [name] is a 61 y.o. female with a hx of CAD s/p DES to pLAD in 03/2017, HLD,  palpitations, HTN, DM and Crohn's disease who presents for follow up.   Had previously seen Dr. Meda Coffee for chest pain where [facility] 07/2019 was low risk without ischemia or scar.   We last saw her in clinic on 07/14/20 where she was having fluctuating blood pressures as well as intermittent episodes of shortness of breath. Felt fatigued at the time. She followed up with HTN clinic where her home readings were thought to be unreliable due to poor technique. She was continued on enalapril 59may and metop 262mdaily.   She was seen in clinic on 08/11/20 with DOE as well as symptoms from her fibromyalgia. She was undergoing clearance for EGD. Myoview 08/18/20 obtained without evidence of ischemia or infarction.  Was last seen in clinic on 02/25/21 with CaLaurann Montanahere she was overall stable with continued mild DOE.   Today,  She denies any palpitations, chest pain, or shortness of breath. No lightheadedness, headaches, syncope, orthopnea, PND, lower extremity edema or exertional symptoms.     Past Medical History:  Diagnosis Date   Allergic rhinitis due to pollen    Allergy    Anxiety state, unspecified    Arthritis    Bronchitis    hx   CAD (coronary artery disease)    s/p NSTEMI in 11/18 treated with a DES to the proximal LAD   Calculus of gallbladder without mention of cholecystitis or obstruction    [place] disease ([facility]   Depressive disorder, not elsewhere classified    Diabetes mellitus without complication  ([facility]   diet controlled   Esophageal reflux    Fibromyalgia    Grade I diastolic dysfunction    Noted on ECHO   Headache    History of nuclear stress test    ETT-Myoview 10/17: EF 55%, normal perfusion; Low Risk // Nuclear stress test 05/2018:  EF 63, normal perfusion; Low Risk   Myalgia and myositis, unspecified    NSTEMI (non-ST elevated myocardial infarction) ([facility]   Other and unspecified hyperlipidemia    Palpitations    Pneumonia 13   hx   S/P angioplasty with stent 04/18/17 with DES to pLAD 04/19/2017   [place]ures ([facility]   1 seizure as a teenager    Unspecified essential hypertension    Vitamin B deficiency     Past Surgical History:  Procedure Laterality Date   BIOPSY  12/05/2018   Procedure: BIOPSY;  Surgeon: NaMauri PoleMD;  [facility];  Service: Endoscopy;;   CHOLECYSTECTOMY N/A 09/19/2014   Procedure: LAPAROSCOPIC CHOLECYSTECTOMY WITH INTRAOPERATIVE CHOLANGIOGRAM;  Surgeon: MaDonnie MesaMD;  [place] [facility] Service: General;  Laterality: N/A;   COLONOSCOPY WITH PROPOFOL N/A 12/05/2018   Procedure: COLONOSCOPY WITH PROPOFOL;  Surgeon: NaMauri PoleMD;  Location: [facility];  Service: Endoscopy;  Laterality: N/A;  chromoendoscopy   CORONARY STENT INTERVENTION N/A 04/18/2017   Procedure: CORONARY STENT INTERVENTION;  Surgeon: JoMartiniquePeter  M, MD;  Location: [facility];  Service: Cardiovascular;  Laterality: N/A;   ESOPHAGOGASTRODUODENOSCOPY     EXPLORATORY LAPAROTOMY     for endometriosis   INTRAVASCULAR PRESSURE WIRE/FFR STUDY N/A 04/18/2017   Procedure: INTRAVASCULAR PRESSURE WIRE/FFR STUDY;  Surgeon: Martinique, Peter M, MD;  Location: [facility];  Service: Cardiovascular;  Laterality: N/A;   LEFT HEART CATH AND CORONARY ANGIOGRAPHY N/A 04/18/2017   Procedure: LEFT HEART CATH AND CORONARY ANGIOGRAPHY;  Surgeon: Martinique, Peter M, MD;  Location: [place] [facility];  Service: Cardiovascular;  Laterality: N/A;    POLYPECTOMY  12/05/2018   Procedure: POLYPECTOMY;  Surgeon: Mauri Pole, MD;  Location: [facility];  Service: Endoscopy;;   SUBMUCOSAL LIFTING INJECTION  12/05/2018   Procedure: SUBMUCOSAL LIFTING INJECTION;  Surgeon: Mauri Pole, MD;  Location: [facility];  Service: Endoscopy;;   TONSILLECTOMY AND ADENOIDECTOMY     TUBAL LIGATION     BILATERAL    Current Medications: No outpatient medications have been marked as taking for the 12/07/21 encounter (Appointment) with Freada Bergeron, MD.     Allergies:   Lipitor [atorvastatin], Remicade [infliximab], Ticagrelor, and Rosuvastatin   Social History   Socioeconomic History   Marital status: Married    Spouse name: Dane   Number of children: 2   Years of education: 12+   Highest education level: Not on file  Occupational History   Occupation: ACCOUNT REP.    Employer: [employer]    Comment: retired  Tobacco Use   Smoking status: Former    Packs/day: 1.00    Years: 35.00    Total pack years: 35.00    Types: Cigarettes    Quit date: 05/31/2004    Years since quitting: 17.5   Smokeless tobacco: Never  Vaping Use   Vaping Use: Never used  Substance and Sexual Activity   Alcohol use: No   Drug use: No   Sexual activity: Not Currently    Comment: married  Other Topics Concern   Not on file  Social History Narrative   Lives with her husband and their pets. Her children are adults and live independently.   Social Determinants of Health   Financial Resource Strain: Not on file  Food Insecurity: Not on file  Transportation Needs: Not on file  Physical Activity: Not on file  Stress: Not on file  Social Connections: Not on file     Family History: The patient's family history includes Coronary artery disease in her brother; Heart attack in her mother; Heart disease in her brother, mother, and sister; Hypertension in her brother, mother, and sister; Lung cancer in her maternal grandmother;  Stroke in her father. There is no history of Colon cancer, Stomach cancer, Esophageal cancer, or Rectal cancer.  ROS:   Please see the history of present illness.    Review of Systems  Constitutional:  Positive for malaise/fatigue. Negative for chills and fever.  HENT:  Negative for hearing loss and sore throat.   Eyes:  Negative for blurred vision and redness.  Respiratory:  Positive for shortness of breath. Negative for cough.   Cardiovascular:  Positive for chest pain. Negative for palpitations, orthopnea, claudication, leg swelling and PND.  Gastrointestinal:  Positive for abdominal pain and heartburn. Negative for blood in stool, melena, nausea and vomiting.  Genitourinary:  Negative for dysuria and hematuria.  Musculoskeletal:  Positive for back pain, joint pain and myalgias.  Neurological:  Negative for dizziness and loss of consciousness.  Endo/Heme/Allergies:  Negative for polydipsia.  Psychiatric/Behavioral:  Negative for substance abuse.     EKGs/Labs/Other Studies Reviewed:    The following studies were reviewed today:  Stress Test 08/18/2020: The left ventricular ejection fraction is normal (55-65%). Nuclear stress EF: 65%. There was no ST segment deviation noted during stress. The study is normal.   No evidence for ischemia or infarction.  Normal wall motion.   Stress test 07/2019 Normal perfusion No ischemia or scar The left ventricular ejection fraction is normal (55-65%). There was no ST segment deviation noted during stress. This is a low risk study.   Echo 03/2017 Left ventricle: The cavity size was normal. Wall thickness was    normal. Systolic function was normal. The estimated ejection    fraction was in the range of 55% to 60%. Mild hypokinesis of the    apicalanteroseptal myocardium. Doppler parameters are consistent    with abnormal left ventricular relaxation (grade 1 diastolic    dysfunction).  - Pulmonary arteries: Systolic pressure was mildly  increased. PA    peak pressure: 35 mm Hg (S).    CORONARY STENT INTERVENTION   03/2017  INTRAVASCULAR PRESSURE WIRE/FFR STUDY  LEFT HEART CATH AND CORONARY ANGIOGRAPHY      Conclusion     Prox LAD lesion is 65% stenosed. A drug-eluting stent was successfully placed using a STENT PROMUS PREM MR 3.0X12. Post intervention, there is a 0% residual stenosis. The left ventricular systolic function is normal. LV end diastolic pressure is normal. The left ventricular ejection fraction is 50-55% by visual estimate.   1. Single vessel obstructive CAD. There is focal and somewhat hazy lesion in the proximal LAD. iFr was 0.88 with FFR of .82. This is of borderline significance but given her clinical scenario with typical chest pain, Ecg changes, and T wave abnormality along with anterior wall motion abnormality we decided to treat this lesion.  2. Overall good LV function 3. Normal LVEDP 4. Successful stenting of the proximal LAD with DES   Plan: DAPT for one year. Anticipate DC tomorrow.    EKG:  EKG is personally reviewed.   07/01/2021: *** 08/11/2020: NSR with HR 80  Recent Labs: 06/24/2021: BUN 17; Creatinine, Ser 0.74; Hemoglobin 14.0; Platelets 328.0; Potassium 4.4; Sodium 134 10/13/2021: ALT 55  Recent Lipid Panel    Component Value Date/Time   CHOL 204 (H) 08/14/2021 0921   CHOL 105 07/14/2020 1007   TRIG (H) 08/14/2021 0921    484.0 Triglyceride is over 400; calculations on Lipids are invalid.   HDL 37.40 (L) 08/14/2021 0921   HDL 37 (L) 07/14/2020 1007   CHOLHDL 5 08/14/2021 0921   VLDL 53.6 (H) 08/15/2014 0734   LDLCALC 7 07/14/2020 1007   LDLDIRECT 102.0 08/14/2021 0921     Risk Assessment/Calculations:       Physical Exam:    VS:  LMP  (LMP Unknown)     Wt Readings from Last 3 Encounters:  10/13/21 136 lb (61.7 kg)  09/23/21 137 lb (62.1 kg)  08/14/21 137 lb 6.4 oz (62.3 kg)     GEN:  Well nourished, well developed in no acute distress HEENT: Normal NECK:  No JVD; No carotid bruits CARDIAC: RRR, no murmurs, rubs, gallops RESPIRATORY:  Clear to auscultation without rales, wheezing or rhonchi  ABDOMEN: Soft, non-tender, non-distended MUSCULOSKELETAL:  No edema; No deformity  SKIN: Warm and dry NEUROLOGIC:  Alert and oriented x 3 PSYCHIATRIC:  Normal affect   ASSESSMENT:    No diagnosis  found.  PLAN:    In order of problems listed above:  #Known CAD s/p pLAD stent in 2018: #SOB and chest tightness Myoview 07/2020 without evidence of ischemia or infarction. Symptoms stable.  -Continue ASA 41m daily -Continue enalapril 116mdaily -Continue metop 2581mL -Management of HLD as below  #HTN: -Continue enalapril 71m86mily -Continue metop 25mg67mdaily    #HLD: #Hypertriglyceridemia Intolerant of statins. LDL controlled at 7. TG 469 (was not taking her fenofibrate or vascepa) -Continue repatha  -Continue fenofibrate 145mg 54my -Continue vascepa 2g BID -Patient states that she has been compliant with medications since last visit  #Generalized Malaise/Fatigue #Joint Pain: Patient concerned about worsening fatigue, total body aches and joint pain. Attempted to refer to rheumatology but not taking new fibromyalgia patients. Has had multiple sleep studies which were negative for significant OSA. -Management per primary care   #DMII: Having frequent episodes of high blood glucose due to dietary indiscretion. Currently on insulin at night. A1C 8.2 -Referred to endocrinology for further management of DMII -Emphasized importance of dietary compliance   Follow-up:  *** months.   Medication Adjustments/Labs and Tests Ordered: Current medicines are reviewed at length with the patient today.  Concerns regarding medicines are outlined above.   No orders of the defined types were placed in this encounter.  No orders of the defined types were placed in this encounter.  There are no Patient Instructions on file for this visit.   ***    Signed, HeatheFreada Bergeron7/10/2021 10:49 AM    [place] [place]

## 2021-12-07 ENCOUNTER — Encounter: Payer: Self-pay | Admitting: Cardiology

## 2021-12-07 ENCOUNTER — Other Ambulatory Visit (HOSPITAL_COMMUNITY): Payer: Self-pay

## 2021-12-07 ENCOUNTER — Telehealth: Payer: Self-pay | Admitting: Pharmacist

## 2021-12-07 ENCOUNTER — Ambulatory Visit: Payer: BC Managed Care – PPO | Admitting: Cardiology

## 2021-12-07 VITALS — BP 110/74 | HR 73 | Ht 62.5 in | Wt 138.4 lb

## 2021-12-07 DIAGNOSIS — E1169 Type 2 diabetes mellitus with other specified complication: Secondary | ICD-10-CM

## 2021-12-07 DIAGNOSIS — I10 Essential (primary) hypertension: Secondary | ICD-10-CM

## 2021-12-07 DIAGNOSIS — E782 Mixed hyperlipidemia: Secondary | ICD-10-CM

## 2021-12-07 DIAGNOSIS — T466X5D Adverse effect of antihyperlipidemic and antiarteriosclerotic drugs, subsequent encounter: Secondary | ICD-10-CM

## 2021-12-07 DIAGNOSIS — I25118 Atherosclerotic heart disease of native coronary artery with other forms of angina pectoris: Secondary | ICD-10-CM | POA: Diagnosis not present

## 2021-12-07 DIAGNOSIS — E118 Type 2 diabetes mellitus with unspecified complications: Secondary | ICD-10-CM | POA: Diagnosis not present

## 2021-12-07 DIAGNOSIS — R0609 Other forms of dyspnea: Secondary | ICD-10-CM

## 2021-12-07 DIAGNOSIS — E785 Hyperlipidemia, unspecified: Secondary | ICD-10-CM

## 2021-12-07 DIAGNOSIS — G720 Drug-induced myopathy: Secondary | ICD-10-CM

## 2021-12-07 DIAGNOSIS — T466X5A Adverse effect of antihyperlipidemic and antiarteriosclerotic drugs, initial encounter: Secondary | ICD-10-CM

## 2021-12-07 DIAGNOSIS — I25119 Atherosclerotic heart disease of native coronary artery with unspecified angina pectoris: Secondary | ICD-10-CM

## 2021-12-07 DIAGNOSIS — Z95820 Peripheral vascular angioplasty status with implants and grafts: Secondary | ICD-10-CM

## 2021-12-07 MED ORDER — FENOFIBRATE 145 MG PO TABS
145.0000 mg | ORAL_TABLET | Freq: Every day | ORAL | 3 refills | Status: DC
  Filled 2021-12-07: qty 90, 90d supply, fill #0
  Filled 2021-12-14: qty 30, 30d supply, fill #0
  Filled 2022-01-15: qty 30, 30d supply, fill #1
  Filled 2022-02-02: qty 30, 30d supply, fill #2
  Filled 2022-03-09: qty 30, 30d supply, fill #3
  Filled 2022-04-08: qty 30, 30d supply, fill #4
  Filled 2022-05-08: qty 30, 30d supply, fill #5
  Filled 2022-06-18: qty 30, 30d supply, fill #6
  Filled 2022-07-13: qty 30, 30d supply, fill #7
  Filled 2022-08-23: qty 30, 30d supply, fill #8
  Filled 2022-09-20 (×2): qty 30, 30d supply, fill #9
  Filled 2022-10-20: qty 30, 30d supply, fill #10
  Filled 2022-11-24: qty 30, 30d supply, fill #11

## 2021-12-07 MED ORDER — METOPROLOL SUCCINATE ER 25 MG PO TB24
37.5000 mg | ORAL_TABLET | Freq: Every day | ORAL | 3 refills | Status: DC
  Filled 2021-12-07: qty 135, 90d supply, fill #0
  Filled 2022-02-02: qty 45, 30d supply, fill #0
  Filled 2022-03-22: qty 45, 30d supply, fill #1
  Filled 2022-04-23: qty 45, 30d supply, fill #2
  Filled 2022-05-18: qty 45, 30d supply, fill #3
  Filled 2022-08-23: qty 45, 30d supply, fill #4
  Filled 2022-09-20 (×2): qty 45, 30d supply, fill #5
  Filled 2022-11-02: qty 45, 30d supply, fill #6

## 2021-12-07 MED ORDER — ENALAPRIL MALEATE 10 MG PO TABS
10.0000 mg | ORAL_TABLET | Freq: Every day | ORAL | 3 refills | Status: DC
  Filled 2021-12-07: qty 30, 30d supply, fill #0
  Filled 2022-02-02: qty 30, 30d supply, fill #1
  Filled 2022-02-24 – 2022-02-25 (×2): qty 30, 30d supply, fill #2
  Filled 2022-04-23: qty 30, 30d supply, fill #3
  Filled 2022-05-18: qty 30, 30d supply, fill #4
  Filled 2022-06-18: qty 30, 30d supply, fill #5
  Filled 2022-07-13: qty 30, 30d supply, fill #6
  Filled 2022-08-23: qty 30, 30d supply, fill #7
  Filled 2022-09-20 (×2): qty 30, 30d supply, fill #8
  Filled 2022-10-20: qty 30, 30d supply, fill #9
  Filled 2022-11-24: qty 30, 30d supply, fill #10

## 2021-12-07 MED ORDER — VASCEPA 1 G PO CAPS
2.0000 g | ORAL_CAPSULE | Freq: Two times a day (BID) | ORAL | 3 refills | Status: DC
  Filled 2021-12-07: qty 360, 90d supply, fill #0
  Filled 2021-12-24: qty 120, 30d supply, fill #0

## 2021-12-07 NOTE — Patient Instructions (Signed)
Medication Instructions:  Your physician recommends that you continue on your current medications as directed. Please refer to the Current Medication list given to you today.  *If you need a refill on your cardiac medications before your next appointment, please call your pharmacy*  Lab Work: If you have labs (blood work) drawn today and your tests are completely normal, you will receive your results only by: [place] (if you have MyChart) OR A paper copy in the mail If you have any lab test that is abnormal or we need to change your treatment, we will call you to review the results.  Follow-Up: At [facility], you and your health needs are our priority.  As part of our continuing mission to provide you with exceptional heart care, we have created designated Provider Care Teams.  These Care Teams include your primary Cardiologist (physician) and Advanced Practice Providers (APPs -  Physician Assistants and Nurse Practitioners) who all work together to provide you with the care you need, when you need it.  We recommend signing up for the patient portal called "MyChart".  Sign up information is provided on this After Visit Summary.  MyChart is used to connect with patients for Virtual Visits (Telemedicine).  Patients are able to view lab/test results, encounter notes, upcoming appointments, etc.  Non-urgent messages can be sent to your provider as well.   To learn more about what you can do with MyChart, go to [email or web address].    Your next appointment:   6 month(s)  The format for your next appointment:   In Person  Provider:   Freada Bergeron, MD {  Other Instructions You have been referred to Endocrinology. You have been referred to Lipid Clinic.   Important Information About Sugar

## 2021-12-07 NOTE — Progress Notes (Signed)
Cardiology Office Note:    Date:  12/07/2021   ID:  [name], DOB [birth year], MRN [id number]  PCP:  Hoyt Koch, MD   [place]  Cardiologist:  Freada Bergeron, MD  Advanced Practice Provider:  No care team member to display Electrophysiologist:  None  :[id number]}   Referring MD: Hoyt Koch, *     History of Present Illness:    [name] is a 61 y.o. female with a hx of CAD s/p DES to pLAD in 03/2017, HLD,  palpitations, HTN, DM and Crohn's disease who presents for follow up.   Had previously seen Dr. Meda Coffee for chest pain where [facility] 07/2019 was low risk without ischemia or scar.   We last saw her in clinic on 07/14/20 where she was having fluctuating blood pressures as well as intermittent episodes of shortness of breath. Felt fatigued at the time. She followed up with HTN clinic where her home readings were thought to be unreliable due to poor technique. She was continued on enalapril 53may and metop 254mdaily.   She was seen in clinic on 08/11/20 with DOE as well as symptoms from her fibromyalgia. She was undergoing clearance for EGD. Myoview 08/18/20 obtained without evidence of ischemia or infarction.  Was last seen in clinic on 02/25/21 with CaLaurann Montanahere she was overall stable with continued mild DOE.   Today, the patient states she has been feeling overall well. She is concerned that her cholesterol is elevated and she can no longer afford the repatha. She is unsure if she is taking vascepa. She also admits that her diabetes has not been well controlled with last A1C 11.7. She denies any chest pain, SOB, orthopnea or PND. Blood pressure is well controlled. Compliant with all meds.   She sleeps a lot and adds that she has not been active recently. Her Fibromyalgia and Arthritis have been limiting her activity.   She denies any palpitations, or shortness of breath. No lightheadedness, headaches, syncope,  orthopnea, PND, lower extremity edema or exertional symptoms.  Past Medical History:  Diagnosis Date   Allergic rhinitis due to pollen    Allergy    Anxiety state, unspecified    Arthritis    Bronchitis    hx   CAD (coronary artery disease)    s/p NSTEMI in 11/18 treated with a DES to the proximal LAD   Calculus of gallbladder without mention of cholecystitis or obstruction    Crohn disease ([facility]   Depressive disorder, not elsewhere classified    Diabetes mellitus without complication ([facility]   diet controlled   Esophageal reflux    Fibromyalgia    Grade I diastolic dysfunction    Noted on ECHO   Headache    History of nuclear stress test    ETT-Myoview 10/17: EF 55%, normal perfusion; Low Risk // Nuclear stress test 05/2018:  EF 63, normal perfusion; Low Risk   Myalgia and myositis, unspecified    NSTEMI (non-ST elevated myocardial infarction) ([place]   Other and unspecified hyperlipidemia    Palpitations    Pneumonia 13   hx   S/P angioplasty with stent 04/18/17 with DES to pLAD 04/19/2017   Seizures ([place]   1 seizure as a teenager    Unspecified essential hypertension    Vitamin B deficiency     Past Surgical History:  Procedure Laterality Date   BIOPSY  12/05/2018   Procedure: BIOPSY;  Surgeon: NaSilverio Decamp  Venia Minks, MD;  Location: [facility];  Service: Endoscopy;;   CHOLECYSTECTOMY N/A 09/19/2014   Procedure: LAPAROSCOPIC CHOLECYSTECTOMY WITH INTRAOPERATIVE CHOLANGIOGRAM;  Surgeon: Donnie Mesa, MD;  Location: [place];  Service: General;  Laterality: N/A;   COLONOSCOPY WITH PROPOFOL N/A 12/05/2018   Procedure: COLONOSCOPY WITH PROPOFOL;  Surgeon: Mauri Pole, MD;  Location: [facility];  Service: Endoscopy;  Laterality: N/A;  chromoendoscopy   CORONARY STENT INTERVENTION N/A 04/18/2017   Procedure: CORONARY STENT INTERVENTION;  Surgeon: Martinique, Peter M, MD;  Location: [place] [facility];  Service: Cardiovascular;  Laterality: N/A;    ESOPHAGOGASTRODUODENOSCOPY     EXPLORATORY LAPAROTOMY     for endometriosis   INTRAVASCULAR PRESSURE WIRE/FFR STUDY N/A 04/18/2017   Procedure: INTRAVASCULAR PRESSURE WIRE/FFR STUDY;  Surgeon: Martinique, Peter M, MD;  Location: [place] [facility];  Service: Cardiovascular;  Laterality: N/A;   LEFT HEART CATH AND CORONARY ANGIOGRAPHY N/A 04/18/2017   Procedure: LEFT HEART CATH AND CORONARY ANGIOGRAPHY;  Surgeon: Martinique, Peter M, MD;  Location: [place] [facility];  Service: Cardiovascular;  Laterality: N/A;   POLYPECTOMY  12/05/2018   Procedure: POLYPECTOMY;  Surgeon: Mauri Pole, MD;  Location: [facility];  Service: Endoscopy;;   SUBMUCOSAL LIFTING INJECTION  12/05/2018   Procedure: SUBMUCOSAL LIFTING INJECTION;  Surgeon: Mauri Pole, MD;  Location: [facility];  Service: Endoscopy;;   TONSILLECTOMY AND ADENOIDECTOMY     TUBAL LIGATION     BILATERAL    Current Medications: Current Meds  Medication Sig   acetaminophen (TYLENOL) 650 MG CR tablet Take 650 mg by mouth every 8 (eight) hours as needed for pain.   albuterol (PROVENTIL) (2.5 MG/3ML) 0.083% nebulizer solution Take 2.5 mg by nebulization every 4 (four) hours as needed for wheezing or shortness of breath.    albuterol (VENTOLIN HFA) 108 (90 Base) MCG/ACT inhaler Inhale 1-2 puffs into the lungs every 6 (six) hours as needed for wheezing or shortness of breath.   aspirin EC 81 MG tablet Take 81 mg by mouth daily. Swallow whole.   Blood Glucose Monitoring Suppl (FREESTYLE LITE) w/Device KIT Use to check blood sugar up to 4 times a day as directed   budesonide (ENTOCORT EC) 3 MG 24 hr capsule Take 3 capsules (9 mg total) by mouth daily.   busPIRone (BUSPAR) 10 MG tablet Take 1 tablet (10 mg total) by mouth 2 (two) times daily.   colestipol (COLESTID) 1 g tablet Take 1 tablet by mouth daily.   Continuous Blood Gluc Sensor (FREESTYLE LIBRE 14 DAY SENSOR) MISC Use to monitor sugars   Continuous Blood Gluc Sensor (FREESTYLE LIBRE  SENSOR SYSTEM) MISC Use for blood sugar monitoring   cyanocobalamin (,VITAMIN B-12,) 1000 MCG/ML injection 1 ML  B12 injection every 2 weeks for 3 months then decrease to 1 B12 injection to once a month   dicyclomine (BENTYL) 20 MG tablet Take 1 tablet (20 mg total) by mouth every 6 (six) hours.   DULoxetine (CYMBALTA) 60 MG capsule Take 1 capsule (60 mg total) by mouth daily.   glimepiride (AMARYL) 2 MG tablet Take 1 tablet (2 mg total) by mouth daily before breakfast.   glucose blood (TRUE METRIX BLOOD GLUCOSE TEST) test strip Use to check blood sugar up to 4 times a day as directed.   insulin glargine-yfgn (SEMGLEE, YFGN,) 100 UNIT/ML Pen Inject 48-55 Units into the skin daily.   Insulin Pen Needle (PEN NEEDLES 3/16") 31G X 5 MM MISC Use daily   Lancets (FREESTYLE) lancets  Use to check blood sugar up to 4 times a day as directed   levocetirizine (XYZAL) 5 MG tablet Take 1 tablet  by mouth every evening.   metFORMIN (GLUCOPHAGE-XR) 500 MG 24 hr tablet Take 2 tablets by mouth daily.   montelukast (SINGULAIR) 10 MG tablet Take 1 tablet by mouth every evening.   Multiple Vitamins-Minerals (MULTIVITAMIN WITH MINERALS) tablet Take 1 tablet by mouth daily.   nitroGLYCERIN (NITROSTAT) 0.4 MG SL tablet Place 1 tablet (0.4 mg total) under the tongue every 5 (five) minutes as needed for chest pain.  Call 911 if no relief after 1st dose.   ondansetron (ZOFRAN ODT) 4 MG disintegrating tablet Take 1 tablet (4 mg total) by mouth every 4 (four) hours as needed for nausea or vomiting.   pantoprazole (PROTONIX) 40 MG tablet Take 1 tablet (40 mg total) by mouth daily.   Probiotic Product (PROBIOTIC DAILY PO) Take 1 capsule by mouth daily.   sodium chloride (OCEAN) 0.65 % SOLN nasal spray Place 1 spray into both nostrils daily as needed for congestion.   sucralfate (CARAFATE) 1 g tablet Take 1 tablet (1 g total) by mouth 3 (three) times daily before meals.   vedolizumab (ENTYVIO) 300 MG injection Inject 300 mg  into the vein every 8 (eight) weeks. After the standard induction   Vitamin D3 (VITAMIN D) 25 MCG tablet Take 1,000 Units by mouth at bedtime.   [DISCONTINUED] enalapril (VASOTEC) 10 MG tablet Take 1 tablet (10 mg total) by mouth daily.   [DISCONTINUED] fenofibrate (TRICOR) 145 MG tablet TAKE ONE TABLET BY MOUTH ONCE DAILY   [DISCONTINUED] metoprolol succinate (TOPROL-XL) 25 MG 24 hr tablet Take 1 & 1/2 tablets (37.5 mg total) by mouth daily.   [DISCONTINUED] VASCEPA 1 g capsule Take 2 capsules by mouth 2 times daily.     Allergies:   Lipitor [atorvastatin], Remicade [infliximab], Ticagrelor, and Rosuvastatin   Social History   Socioeconomic History   Marital status: Married    Spouse name: Dane   Number of children: 2   Years of education: 12+   Highest education level: Not on file  Occupational History   Occupation: ACCOUNT REP.    Employer: [employer]    Comment: retired  Tobacco Use   Smoking status: Former    Packs/day: 1.00    Years: 35.00    Total pack years: 35.00    Types: Cigarettes    Quit date: 05/31/2004    Years since quitting: 17.5   Smokeless tobacco: Never  Vaping Use   Vaping Use: Never used  Substance and Sexual Activity   Alcohol use: No   Drug use: No   Sexual activity: Not Currently    Comment: married  Other Topics Concern   Not on file  Social History Narrative   Lives with her husband and their pets. Her children are adults and live independently.   Social Determinants of Health   Financial Resource Strain: Not on file  Food Insecurity: Not on file  Transportation Needs: Not on file  Physical Activity: Not on file  Stress: Not on file  Social Connections: Not on file     Family History: The patient's family history includes Coronary artery disease in her brother; Heart attack in her mother; Heart disease in her brother, mother, and sister; Hypertension in her brother, mother, and sister; Lung cancer in her maternal  grandmother; Stroke in her father. There is no history of Colon cancer, Stomach cancer, Esophageal cancer, or Rectal  cancer.  ROS:   Please see the history of present illness. (+)Chest pain (+) Fatigue All other systems are reviewed and negative.    EKGs/Labs/Other Studies Reviewed:    The following studies were reviewed today:  Stress Test 08/18/2020: The left ventricular ejection fraction is normal (55-65%). Nuclear stress EF: 65%. There was no ST segment deviation noted during stress. The study is normal.   No evidence for ischemia or infarction.  Normal wall motion.   Stress test 07/2019 Normal perfusion No ischemia or scar The left ventricular ejection fraction is normal (55-65%). There was no ST segment deviation noted during stress. This is a low risk study.   Echo 03/2017 Left ventricle: The cavity size was normal. Wall thickness was    normal. Systolic function was normal. The estimated ejection    fraction was in the range of 55% to 60%. Mild hypokinesis of the    apicalanteroseptal myocardium. Doppler parameters are consistent    with abnormal left ventricular relaxation (grade 1 diastolic    dysfunction).  - Pulmonary arteries: Systolic pressure was mildly increased. PA    peak pressure: 35 mm Hg (S).    CORONARY STENT INTERVENTION   03/2017  INTRAVASCULAR PRESSURE WIRE/FFR STUDY  LEFT HEART CATH AND CORONARY ANGIOGRAPHY      Conclusion     Prox LAD lesion is 65% stenosed. A drug-eluting stent was successfully placed using a STENT PROMUS PREM MR 3.0X12. Post intervention, there is a 0% residual stenosis. The left ventricular systolic function is normal. LV end diastolic pressure is normal. The left ventricular ejection fraction is 50-55% by visual estimate.   1. Single vessel obstructive CAD. There is focal and somewhat hazy lesion in the proximal LAD. iFr was 0.88 with FFR of .82. This is of borderline significance but given her clinical scenario with  typical chest pain, Ecg changes, and T wave abnormality along with anterior wall motion abnormality we decided to treat this lesion.  2. Overall good LV function 3. Normal LVEDP 4. Successful stenting of the proximal LAD with DES   Plan: DAPT for one year. Anticipate DC tomorrow.    EKG:  EKG is personally reviewed.   No new tracing  08/11/2020: NSR with HR 80  Recent Labs: 06/24/2021: BUN 17; Creatinine, Ser 0.74; Hemoglobin 14.0; Platelets 328.0; Potassium 4.4; Sodium 134 10/13/2021: ALT 55  Recent Lipid Panel    Component Value Date/Time   CHOL 204 (H) 08/14/2021 0921   CHOL 105 07/14/2020 1007   TRIG (H) 08/14/2021 0921    484.0 Triglyceride is over 400; calculations on Lipids are invalid.   HDL 37.40 (L) 08/14/2021 0921   HDL 37 (L) 07/14/2020 1007   CHOLHDL 5 08/14/2021 0921   VLDL 53.6 (H) 08/15/2014 0734   LDLCALC 7 07/14/2020 1007   LDLDIRECT 102.0 08/14/2021 0921     Risk Assessment/Calculations:       Physical Exam:    VS:  BP 110/74   Pulse 73   Ht 5' 2.5" (1.588 m)   Wt 138 lb 6.4 oz (62.8 kg)   LMP  (LMP Unknown)   SpO2 96%   BMI 24.91 kg/m     Wt Readings from Last 3 Encounters:  12/07/21 138 lb 6.4 oz (62.8 kg)  10/13/21 136 lb (61.7 kg)  09/23/21 137 lb (62.1 kg)     GEN:  Well nourished, well developed in no acute distress HEENT: Normal NECK: No JVD; No carotid bruits CARDIAC: RRR, no murmurs RESPIRATORY:  Clear  to auscultation without rales, wheezing or rhonchi  ABDOMEN: Soft, non-tender, non-distended MUSCULOSKELETAL:  No edema; No deformity  SKIN: Warm and dry NEUROLOGIC:  Alert and oriented x 3 PSYCHIATRIC:  Normal affect   ASSESSMENT:    1. Elevated triglycerides with high cholesterol   2. Coronary artery disease of native artery of native heart with stable angina pectoris ([place])   3. Diabetes mellitus with complication ([place])   4. Hyperlipidemia LDL goal <70   5. Essential hypertension   6. Coronary artery disease involving  native coronary artery of native heart with angina pectoris ([place])   7. S/P angioplasty with stent   8. Statin myopathy   9. Dyspnea on exertion   10. Hyperlipidemia associated with type 2 diabetes mellitus (HCC)    PLAN:    In order of problems listed above:  #Known CAD s/p pLAD stent in 2018: #SOB and chest tightness Myoview 07/2020 without evidence of ischemia or infarction. Currently doing well with no anginal symptoms.  -Continue ASA 59m daily -Continue enalapril 188mdaily -Continue metop 2541mL -Management of HLD as below  #HTN: Well controlled and at goal. -Continue enalapril 71m25mily -Continue metop 25mg70mdaily    #HLD: #Hypertriglyceridemia Intolerant of statins. Has been off of repatha due to cost and does not appear to be taking vascepa. Will re-refer to lipid clinic and inquire as to why her cost for repatha went up so we can get her back on the medication as able. -Refer to lipid clinic -Will ask Pharm D about repatha coverage -Continue fenofibrate 145mg 19my -Continue vascepa 2g BID (she is unsure if she is taking and will represcribe today)  #Generalized Malaise/Fatigue #Joint Pain: Patient concerned about worsening fatigue, total body aches and joint pain. Attempted to refer to rheumatology but not taking new fibromyalgia patients. Has had multiple sleep studies which were negative for significant OSA. -Management per primary care   #DMII with hyperglycemia Having frequent episodes of high blood glucose due to dietary indiscretion. A1C 11.7. -Referred to endocrinology for further management of DMII -Emphasized importance of dietary compliance   Follow-up:  6 months.   Medication Adjustments/Labs and Tests Ordered: Current medicines are reviewed at length with the patient today.  Concerns regarding medicines are outlined above.   Orders Placed This Encounter  Procedures   [facility]   Ambulatory referral to Endocrinology    Meds ordered this encounter  Medications   enalapril (VASOTEC) 10 MG tablet    Sig: Take 1 tablet (10 mg total) by mouth daily.    Dispense:  90 tablet    Refill:  3   fenofibrate (TRICOR) 145 MG tablet    Sig: TAKE ONE TABLET BY MOUTH ONCE DAILY    Dispense:  90 tablet    Refill:  3   metoprolol succinate (TOPROL-XL) 25 MG 24 hr tablet    Sig: Take 1 & 1/2 tablets (37.5 mg total) by mouth daily.    Dispense:  135 tablet    Refill:  3   VASCEPA 1 g capsule    Sig: Take 2 capsules by mouth 2 times daily.    Dispense:  360 capsule    Refill:  3   Patient Instructions  Medication Instructions:  Your physician recommends that you continue on your current medications as directed. Please refer to the Current Medication list given to you today.  *If you need a refill on your cardiac medications before your next appointment, please call your pharmacy*  Lab Work: If you have labs (blood work) drawn today and your tests are completely normal, you will receive your results only by: [place] (if you have MyChart) OR A paper copy in the mail If you have any lab test that is abnormal or we need to change your treatment, we will call you to review the results.  Follow-Up: At [facility] you and your health needs are our priority.  As part of our continuing mission to provide you with exceptional heart care, we have created designated Provider Care Teams.  These Care Teams include your primary Cardiologist (physician) and Advanced Practice Providers (APPs -  Physician Assistants and Nurse Practitioners) who all work together to provide you with the care you need, when you need it.  We recommend signing up for the patient portal called "MyChart".  Sign up information is provided on this After Visit Summary.  MyChart is used to connect with patients for Virtual Visits (Telemedicine).  Patients are able to view lab/test results, encounter notes, upcoming appointments, etc.  Non-urgent  messages can be sent to your provider as well.   To learn more about what you can do with MyChart, go to [email or web address].    Your next appointment:   6 month(s)  The format for your next appointment:   In Person  Provider:   Freada Bergeron, MD {  Other Instructions You have been referred to Endocrinology. You have been referred to Lipid Clinic.   Important Information About Sugar          Johnette Abraham, acting as a scribe for Freada Bergeron, MD.,have documented all relevant documentation on the behalf of Freada Bergeron, MD,as directed by  Freada Bergeron, MD while in the presence of Freada Bergeron, MD.   I, Freada Bergeron, MD, have reviewed all documentation for this visit. The documentation on 12/07/21 for the exam, diagnosis, procedures, and orders are all accurate and complete.

## 2021-12-07 NOTE — Telephone Encounter (Signed)
Pt seen by MD today in office and mentioned that her [place] had increased from $5 to $200. She still has Pharmacist, community and should be able to use her $5 copay card.   Called pt and left detailed message: she will need to check with her pharmacy and make sure they're running the copay card still, sounds like they unlinked it. If they can't find the copay card info for some reason, she'll need to call in to [facility] and have them give her copay card info again. Their line is [phone].

## 2021-12-08 DIAGNOSIS — K50813 Crohn's disease of both small and large intestine with fistula: Secondary | ICD-10-CM | POA: Diagnosis not present

## 2021-12-12 ENCOUNTER — Other Ambulatory Visit (HOSPITAL_COMMUNITY): Payer: Self-pay

## 2021-12-14 ENCOUNTER — Other Ambulatory Visit (HOSPITAL_COMMUNITY): Payer: Self-pay

## 2021-12-14 ENCOUNTER — Other Ambulatory Visit: Payer: Self-pay | Admitting: Internal Medicine

## 2021-12-16 ENCOUNTER — Other Ambulatory Visit (HOSPITAL_COMMUNITY): Payer: Self-pay

## 2021-12-16 ENCOUNTER — Encounter: Payer: Self-pay | Admitting: Internal Medicine

## 2021-12-16 ENCOUNTER — Ambulatory Visit: Payer: BC Managed Care – PPO | Admitting: Internal Medicine

## 2021-12-16 VITALS — BP 124/70 | HR 70 | Resp 18 | Ht 62.5 in | Wt 139.8 lb

## 2021-12-16 DIAGNOSIS — E118 Type 2 diabetes mellitus with unspecified complications: Secondary | ICD-10-CM

## 2021-12-16 DIAGNOSIS — M7062 Trochanteric bursitis, left hip: Secondary | ICD-10-CM | POA: Diagnosis not present

## 2021-12-16 LAB — POCT GLYCOSYLATED HEMOGLOBIN (HGB A1C): Hemoglobin A1C: 8.8 % — AB (ref 4.0–5.6)

## 2021-12-16 MED ORDER — KETOROLAC TROMETHAMINE 30 MG/ML IJ SOLN
30.0000 mg | Freq: Once | INTRAMUSCULAR | Status: AC
  Administered 2021-12-16: 30 mg via INTRAMUSCULAR

## 2021-12-16 MED ORDER — METFORMIN HCL ER 500 MG PO TB24
1000.0000 mg | ORAL_TABLET | Freq: Every day | ORAL | 5 refills | Status: DC
  Filled 2021-12-16: qty 60, 30d supply, fill #0
  Filled 2022-01-19: qty 60, 30d supply, fill #1
  Filled 2022-02-02: qty 60, 30d supply, fill #2
  Filled 2022-03-22: qty 60, 30d supply, fill #3
  Filled 2022-04-23: qty 60, 30d supply, fill #4
  Filled 2022-05-18: qty 60, 30d supply, fill #5

## 2021-12-16 MED ORDER — METHYLPREDNISOLONE ACETATE 40 MG/ML IJ SUSP
40.0000 mg | Freq: Once | INTRAMUSCULAR | Status: AC
  Administered 2021-12-16: 40 mg via INTRAMUSCULAR

## 2021-12-16 NOTE — Progress Notes (Signed)
   Subjective:   Patient ID: [name], female    DOB: [birth year], 61 y.o.   MRN: [id number]  HPI The patient is a 61 YO female coming in for left hip pain.   Review of Systems  Constitutional: Negative.   HENT: Negative.    Eyes: Negative.   Respiratory:  Negative for cough, chest tightness and shortness of breath.   Cardiovascular:  Negative for chest pain, palpitations and leg swelling.  Gastrointestinal:  Negative for abdominal distention, abdominal pain, constipation, diarrhea, nausea and vomiting.  Musculoskeletal:  Positive for arthralgias.  Skin: Negative.   Neurological: Negative.   Psychiatric/Behavioral: Negative.      Objective:  Physical Exam Constitutional:      Appearance: She is well-developed.  HENT:     Head: Normocephalic and atraumatic.  Cardiovascular:     Rate and Rhythm: Normal rate and regular rhythm.  Pulmonary:     Effort: Pulmonary effort is normal. No respiratory distress.     Breath sounds: Normal breath sounds. No wheezing or rales.  Abdominal:     General: Bowel sounds are normal. There is no distension.     Palpations: Abdomen is soft.     Tenderness: There is no abdominal tenderness. There is no rebound.  Musculoskeletal:        General: Tenderness present.     Cervical back: Normal range of motion.  Skin:    General: Skin is warm and dry.  Neurological:     Mental Status: She is alert and oriented to person, place, and time.     Coordination: Coordination normal.     Vitals:   12/16/21 0838  BP: 124/70  Pulse: 70  Resp: 18  SpO2: 96%  Weight: 139 lb 12.8 oz (63.4 kg)  Height: 5' 2.5" (1.588 m)    Assessment & Plan:  Depo-medrol 40 mg IM and toradol 30 mg IM given at visit

## 2021-12-16 NOTE — Patient Instructions (Signed)
The sugars are better today at 8.8 so keep working on it.  We have given you the shots today to help the pain in the hip.

## 2021-12-18 DIAGNOSIS — M7062 Trochanteric bursitis, left hip: Secondary | ICD-10-CM | POA: Insufficient documentation

## 2021-12-18 NOTE — Assessment & Plan Note (Signed)
Location and history likely trochanteric bursitis. Given depo-medrol 40 mg IM and toradol 30 mg IM. Given stretching exercises to help as well.

## 2021-12-18 NOTE — Assessment & Plan Note (Signed)
POC HgA1c and improving from 11.7 last time to 8.8 today. Still above goal but she is committed to making changes to help with control. Acceptable for steroid injection IM today. Continue amaryl 2 mg daily and semglee 48 units qhs and metformin 1000 mg daily. Cannot afford eye exam currently.

## 2021-12-21 DIAGNOSIS — M79602 Pain in left arm: Secondary | ICD-10-CM | POA: Diagnosis not present

## 2021-12-22 ENCOUNTER — Ambulatory Visit: Payer: BC Managed Care – PPO | Admitting: Gastroenterology

## 2021-12-22 ENCOUNTER — Encounter: Payer: Self-pay | Admitting: Gastroenterology

## 2021-12-22 ENCOUNTER — Other Ambulatory Visit (INDEPENDENT_AMBULATORY_CARE_PROVIDER_SITE_OTHER): Payer: BC Managed Care – PPO

## 2021-12-22 VITALS — BP 124/68 | HR 73 | Ht 62.0 in | Wt 140.0 lb

## 2021-12-22 DIAGNOSIS — K50818 Crohn's disease of both small and large intestine with other complication: Secondary | ICD-10-CM | POA: Diagnosis not present

## 2021-12-22 DIAGNOSIS — E538 Deficiency of other specified B group vitamins: Secondary | ICD-10-CM | POA: Diagnosis not present

## 2021-12-22 LAB — CBC WITH DIFFERENTIAL/PLATELET
Basophils Absolute: 0.1 10*3/uL (ref 0.0–0.1)
Basophils Relative: 1 % (ref 0.0–3.0)
Eosinophils Absolute: 0.2 10*3/uL (ref 0.0–0.7)
Eosinophils Relative: 1.9 % (ref 0.0–5.0)
HCT: 40.6 % (ref 36.0–46.0)
Hemoglobin: 13.5 g/dL (ref 12.0–15.0)
Lymphocytes Relative: 30.8 % (ref 12.0–46.0)
Lymphs Abs: 2.5 10*3/uL (ref 0.7–4.0)
MCHC: 33.2 g/dL (ref 30.0–36.0)
MCV: 90.3 fl (ref 78.0–100.0)
Monocytes Absolute: 0.7 10*3/uL (ref 0.1–1.0)
Monocytes Relative: 9 % (ref 3.0–12.0)
Neutro Abs: 4.7 10*3/uL (ref 1.4–7.7)
Neutrophils Relative %: 57.3 % (ref 43.0–77.0)
Platelets: 270 10*3/uL (ref 150.0–400.0)
RBC: 4.5 Mil/uL (ref 3.87–5.11)
RDW: 15.6 % — ABNORMAL HIGH (ref 11.5–15.5)
WBC: 8.2 10*3/uL (ref 4.0–10.5)

## 2021-12-22 LAB — C-REACTIVE PROTEIN: CRP: <1 mg/dL (ref 0.5–20.0)

## 2021-12-22 LAB — COMPREHENSIVE METABOLIC PANEL
ALT: 63 U/L — ABNORMAL HIGH (ref 0–35)
AST: 37 U/L (ref 0–37)
Albumin: 4.1 g/dL (ref 3.5–5.2)
Alkaline Phosphatase: 125 U/L — ABNORMAL HIGH (ref 39–117)
BUN: 14 mg/dL (ref 6–23)
CO2: 27 mEq/L (ref 19–32)
Calcium: 9.5 mg/dL (ref 8.4–10.5)
Chloride: 97 mEq/L (ref 96–112)
Creatinine, Ser: 0.63 mg/dL (ref 0.40–1.20)
GFR: 94.34 mL/min (ref >60.00)
Glucose, Bld: 372 mg/dL — ABNORMAL HIGH (ref 70–99)
Potassium: 4.8 mEq/L (ref 3.5–5.1)
Sodium: 134 mEq/L — ABNORMAL LOW (ref 135–145)
Total Bilirubin: 0.3 mg/dL (ref 0.2–1.2)
Total Protein: 6.3 g/dL (ref 6.0–8.3)

## 2021-12-22 LAB — FOLATE: Folate: >24.2 ng/mL (ref >5.9)

## 2021-12-22 LAB — VITAMIN B12: Vitamin B-12: 420 pg/mL (ref 211–911)

## 2021-12-22 NOTE — Patient Instructions (Signed)
Your provider has requested that you go to the basement level for lab work before leaving today. Press "B" on the elevator. The lab is located at the first door on the left as you exit the elevator.  Please remain on your current medications.   The [place] [facility] providers would like to encourage you to use [facility] to communicate with providers for non-urgent requests or questions.  Due to long hold times on the telephone, sending your provider a message by [facility] may be a faster and more efficient way to get a response.  Please allow 48 business hours for a response.  Please remember that this is for non-urgent requests.   Due to recent changes in healthcare laws, you may see the results of your imaging and laboratory studies on MyChart before your provider has had a chance to review them.  We understand that in some cases there may be results that are confusing or concerning to you. Not all laboratory results come back in the same time frame and the provider may be waiting for multiple results in order to interpret others.  Please give [place] 48 hours in order for your provider to thoroughly review all the results before contacting the office for clarification of your results.   Please follow up with Dr. Silverio Decamp in 3 months.

## 2021-12-24 ENCOUNTER — Ambulatory Visit: Payer: BC Managed Care – PPO | Admitting: Pharmacist

## 2021-12-24 ENCOUNTER — Other Ambulatory Visit (HOSPITAL_COMMUNITY): Payer: Self-pay

## 2021-12-24 DIAGNOSIS — E785 Hyperlipidemia, unspecified: Secondary | ICD-10-CM

## 2021-12-24 DIAGNOSIS — E1169 Type 2 diabetes mellitus with other specified complication: Secondary | ICD-10-CM

## 2021-12-24 MED ORDER — VASCEPA 1 G PO CAPS: 2.0000 g | ORAL_CAPSULE | Freq: Two times a day (BID) | ORAL | 3 refills | Status: DC

## 2021-12-24 MED ORDER — REPATHA SURECLICK 140 MG/ML ~~LOC~~ SOAJ
1.0000 | SUBCUTANEOUS | 11 refills | Status: DC
  Filled 2021-12-24: qty 2, 28d supply, fill #0
  Filled 2022-01-28: qty 2, 28d supply, fill #1
  Filled 2022-03-02: qty 2, 28d supply, fill #2
  Filled 2022-03-24: qty 2, 28d supply, fill #3
  Filled 2022-04-23: qty 2, 28d supply, fill #4
  Filled 2022-05-20: qty 2, 28d supply, fill #5
  Filled 2022-07-05: qty 2, 28d supply, fill #6
  Filled 2022-08-23: qty 2, 28d supply, fill #7
  Filled 2022-09-20: qty 2, 28d supply, fill #8
  Filled 2022-11-24: qty 2, 28d supply, fill #9

## 2021-12-24 NOTE — Progress Notes (Signed)
Patient ID: KHLOE [name]                 DOB: 1961/01/12                    MRN: [id number]     HPI: [name] is a 61 y.o. female patient referred to lipid clinic by Dr Meda Coffee. PMH is significant for CAD s/p NSTEMI 03/2017 treated with DES to prox LAD, DM, HTN, GERD, and Crohn's disease. Pt reported some atypical chest pain and fatigue with exertion at her last visit in January 2021. She underwent stress test which showed normal perfusion and was low risk. I have previously seen pt in lipid clinic in 2017 before her MI. At that time, she had previously undergone coronary calcium scoring which was 4 (87th percentile). At that time, she was started on low dose rosuvastatin and fenofibrate, and Colestipol was stopped to reduce pill burden and due to lack of CV outcomes. She was seen again in 2021 and was started on Repatha. She saw Dr. Johney Frame on 12/07/21 and reported she had stopped Repatha due to cost and did not think she was taking Vascepa.   Patient presents today to lipid clinic. Her insurance switched to her husbands insurance, but this was awhile ago and we had a PA on file. I called [facility], they were able to run rx with e-voucher and states cost is $4.99. when they tried to fill Vascepa it said pt had to use [facility].  States her BG is around 170. A1C high, but much improved. Trying to walk daily as Dr. Johney Frame told her. Doesn't do well with the heat. Is taking the fenofibrate.  Current Medications: fenofibrate 150m daily Intolerances: rosuvastatin 5-20mg daily, (muscle aches), atorvastatin 20-40mg daily (elevated ALT), pravastatin 10-80mg daily, colestipol (stopped to decrease pill burden) Risk Factors: CAD s/p MI, DM, HTN, family hx LDL goal: <558mdL  Diet: Breakfast: atkins protein shake or multigrain cheerios Lunch: sometimes nothing, tomato sandwich, ham or tuKuwaitandwich Dinner: chicken w/ rice and vegetable Snacks: cookies Drinks: water  Exercise: Walks  daily.  Family History: Mother (CAD, MI, decreased at 6761 father (Stroke, MI, endocarditis, deceased at 54[address] brother (CAD, aortic aneurysm, HTN ), sister (HTN, MI, deceased at 6321 Her brother's son and grandson have both suffered from strokes (3383nd 1915o respectively).   Social History: Former smoker 1 PPD, quit in 201751denies illicit drug use and alcohol use.  Labs: 08/14/21 01/19/19: TC 152, TG 178, HDL 48, LDL 68 (thinks she was taking rosuvastatin)  Past Medical History:  Diagnosis Date   Allergic rhinitis due to pollen    Allergy    Anxiety state, unspecified    Arthritis    Bronchitis    hx   CAD (coronary artery disease)    s/p NSTEMI in 11/18 treated with a DES to the proximal LAD   Calculus of gallbladder without mention of cholecystitis or obstruction    [place] disease ([facility]   Depressive disorder, not elsewhere classified    Diabetes mellitus without complication ([facility]   diet controlled   Esophageal reflux    Fibromyalgia    Grade I diastolic dysfunction    Noted on ECHO   Headache    History of nuclear stress test    ETT-Myoview 10/17: EF 55%, normal perfusion; Low Risk // Nuclear stress test 05/2018:  EF 63, normal perfusion; Low Risk   Myalgia and myositis, unspecified    NSTEMI (  non-ST elevated myocardial infarction) ([place]) 04/15/2017   Other and unspecified hyperlipidemia    Palpitations    Pneumonia 15   hx   S/P angioplasty with stent 04/18/17 with DES to pLAD 04/19/2017   Seizures ([place])    1 seizure as a teenager    Unspecified essential hypertension    Vitamin B deficiency     Current Outpatient Medications on File Prior to Visit  Medication Sig Dispense Refill   acetaminophen (TYLENOL) 650 MG CR tablet Take 650 mg by mouth every 8 (eight) hours as needed for pain.     albuterol (PROVENTIL) (2.5 MG/3ML) 0.083% nebulizer solution Take 2.5 mg by nebulization every 4 (four) hours as needed for wheezing or shortness of breath.      albuterol (VENTOLIN  HFA) 108 (90 Base) MCG/ACT inhaler Inhale 1-2 puffs into the lungs every 6 (six) hours as needed for wheezing or shortness of breath.     aspirin EC 81 MG tablet Take 81 mg by mouth daily. Swallow whole.     Blood Glucose Monitoring Suppl (FREESTYLE LITE) w/Device KIT Use to check blood sugar up to 4 times a day as directed 1 kit 0   budesonide (ENTOCORT EC) 3 MG 24 hr capsule Take 3 capsules (9 mg total) by mouth daily. 270 capsule 0   busPIRone (BUSPAR) 10 MG tablet Take 1 tablet (10 mg total) by mouth 2 (two) times daily. 180 tablet 0   colestipol (COLESTID) 1 g tablet Take 1 tablet by mouth daily. 90 tablet 1   Continuous Blood Gluc Sensor (FREESTYLE LIBRE 14 DAY SENSOR) MISC Use to monitor sugars 2 each 11   Continuous Blood Gluc Sensor (FREESTYLE LIBRE SENSOR SYSTEM) MISC Use for blood sugar monitoring 1 each 0   cyanocobalamin (,VITAMIN B-12,) 1000 MCG/ML injection 1 ML  B12 injection every 2 weeks for 3 months then decrease to 1 B12 injection to once a month 1 mL 12   dicyclomine (BENTYL) 20 MG tablet Take 1 tablet (20 mg total) by mouth every 6 (six) hours. 60 tablet 0   DULoxetine (CYMBALTA) 60 MG capsule Take 1 capsule (60 mg total) by mouth daily. 90 capsule 3   enalapril (VASOTEC) 10 MG tablet Take 1 tablet (10 mg total) by mouth daily. 90 tablet 3   Evolocumab (REPATHA SURECLICK) 676 MG/ML SOAJ Inject the contents of 1 pen into the skin every 14 days as directed. 6 mL 2   fenofibrate (TRICOR) 145 MG tablet TAKE ONE TABLET BY MOUTH ONCE DAILY 90 tablet 3   fluorouracil (EFUDEX) 5 % cream      furosemide (LASIX) 40 MG tablet Take 1 tablet (40 mg total) by mouth daily as needed for fluid or edema. 30 tablet 2   glimepiride (AMARYL) 2 MG tablet Take 1 tablet (2 mg total) by mouth daily before breakfast. 30 tablet 3   glucose blood (TRUE METRIX BLOOD GLUCOSE TEST) test strip Use to check blood sugar up to 4 times a day as directed. 100 each 3   insulin glargine-yfgn (SEMGLEE, YFGN,) 100  UNIT/ML Pen Inject 48-55 Units into the skin daily. 30 mL 3   Insulin Pen Needle (PEN NEEDLES 3/16") 31G X 5 MM MISC Use daily 50 each 11   Lancets (FREESTYLE) lancets Use to check blood sugar up to 4 times a day as directed 100 each 0   levocetirizine (XYZAL) 5 MG tablet Take 1 tablet  by mouth every evening. 90 tablet 0   metFORMIN (GLUCOPHAGE-XR) 500  MG 24 hr tablet Take 2 tablets by mouth daily. 60 tablet 5   metoprolol succinate (TOPROL-XL) 25 MG 24 hr tablet Take 1 & 1/2 tablets (37.5 mg total) by mouth daily. 135 tablet 3   montelukast (SINGULAIR) 10 MG tablet Take 1 tablet by mouth every evening. 90 tablet 3   Multiple Vitamins-Minerals (MULTIVITAMIN WITH MINERALS) tablet Take 1 tablet by mouth daily.     nitroGLYCERIN (NITROSTAT) 0.4 MG SL tablet Place 1 tablet (0.4 mg total) under the tongue every 5 (five) minutes as needed for chest pain.  Call 911 if no relief after 1st dose. 25 tablet 5   ondansetron (ZOFRAN ODT) 4 MG disintegrating tablet Take 1 tablet (4 mg total) by mouth every 4 (four) hours as needed for nausea or vomiting. 20 tablet 0   pantoprazole (PROTONIX) 40 MG tablet Take 1 tablet (40 mg total) by mouth daily. 240 tablet 0   Probiotic Product (PROBIOTIC DAILY PO) Take 1 capsule by mouth daily.     Risankizumab-rzaa (SKYRIZI) 360 MG/2.4ML SOCT Week 12 begin 360 mg SQ injection by cartridge and repeat every 8 weeks 2.4 mL 7   risankizumab-rzaa (SKYRIZI) 600 MG/10ML injection 600 mg IV induction at week 0,4,& 8 (Patient not taking: Reported on 12/22/2021) 30 mL 0   sodium chloride (OCEAN) 0.65 % SOLN nasal spray Place 1 spray into both nostrils daily as needed for congestion.     sucralfate (CARAFATE) 1 g tablet Take 1 tablet (1 g total) by mouth 3 (three) times daily before meals. 120 tablet 1   VASCEPA 1 g capsule Take 2 capsules by mouth 2 times daily. 360 capsule 3   Vitamin D3 (VITAMIN D) 25 MCG tablet Take 1,000 Units by mouth at bedtime.     [DISCONTINUED] EPIPEN 2-PAK  0.3 MG/0.3ML SOAJ injection Inject 0.3 mg into the muscle as needed for anaphylaxis. Reported on 06/27/2015  0   [DISCONTINUED] insulin glargine (LANTUS) 100 UNIT/ML injection Inject 0.48 mLs (48 Units total) into the skin at bedtime. 40 mL 0   No current facility-administered medications on file prior to visit.    Allergies  Allergen Reactions   Lipitor [Atorvastatin] Other (See Comments)    Elevated ALT   Remicade [Infliximab]     REACTION: SOB   Ticagrelor     Shortness of breath   Rosuvastatin Other (See Comments)    Pt reports causes muscle aches and leg pains and feet to swell    Assessment/Plan:  1. Hyperlipidemia - Will resume Repatha. Copay affordable at $4.99. [facility] and I also did a PA incase it was needed. I gave her a copay card for Vascepa that she may or may not be able to use depending on if her insurance covers brand or generic. Continue fenofibrate 140m daily. I have asked her to call me if there are any issues getting her medications in the future. I encouraged patient to start walking daily, especially after a meal to help with blood sugar.   12/24/2021 9:02 AM

## 2021-12-24 NOTE — Patient Instructions (Addendum)
Please resume Repatha 188m every 14 days Please resume vascepa 2 capsules twice a day. Please call me if this is not affordable Continue fenofibrate Try to go for a daily walk  Please call me at [phone] you have issues getting your medications

## 2021-12-28 ENCOUNTER — Telehealth: Payer: Self-pay | Admitting: Pharmacist

## 2021-12-28 ENCOUNTER — Encounter: Payer: Self-pay | Admitting: Gastroenterology

## 2021-12-28 NOTE — Progress Notes (Signed)
[name]    [id number]    11-Oct-1960  Primary Care Physician:Crawford, Real Cons, MD  Referring Physician: Hoyt Koch, MD [address] [place],  [place] [address]   Chief complaint: Crohn's disease  HPI:  61 year old very pleasant female with history of Crohn's disease here for follow-up visit    She is currently on [place], in clinical remission.  She is off budesonide.  She is no longer experiencing any diarrhea or abdominal discomfort.   GI history CRP elevated to 55,000 in May 2022 and was persistently elevated on Entyvio and budesonide   Colonoscopy December 05, 2018: Terminal ileal biopsies consistent with Crohn's disease.  IC valve polypoid lesion status post EMR, biopsies consistent with IBD negative for dysplasia or adenomatous tissue 5 mm polyp, tubular adenoma removed from transverse colon otherwise normal colon on chromoendoscopy   Colonoscopy July 07, 2018 Nodular TI biopsied Localized nodular polypoid mucosa in IC valve biopsied, ?  Adenoma on pathology report, requested additional review is pending.   EGD 08/22/2009: Esophagitis otherwise unremarkable exam      MRI pelvis February 14, 2018 showed grade 1 simple linear intersphincteric perianal fistula without associated abscess.  Mild segmental wall thickening and luminal narrowing in the distal ileum suggestive of mild active Crohn's ileitis.  Mild chronic wall thickening in the terminal ileum without active inflammatory changes.   Relevant GI history: Crohn's disease with predominant small bowel involvement diagnosed in 1991.  Initially was managed with prednisone, had significant side effects.  Subsequently was in clinical remission on 6-MP.  6-MP was discontinued in 2017 due to persistently elevated transaminases, have improved after discontinuing 6-MP.  She was started on Humira, clinically failed with persistent symptoms.  Had undetectable drug trough with elevated antibodies.   Started on budesonide 9 mg daily and taper down to 3 mg daily in 2018.  Developed rectal pain and noted to have perianal fistula on MRI pelvis September 2019.   Relevant other PMH She had NSTEMI in November 2018, status post coronary stent placement (DES)   Outpatient Encounter Medications as of 12/22/2021  Medication Sig   acetaminophen (TYLENOL) 650 MG CR tablet Take 650 mg by mouth every 8 (eight) hours as needed for pain.   albuterol (PROVENTIL) (2.5 MG/3ML) 0.083% nebulizer solution Take 2.5 mg by nebulization every 4 (four) hours as needed for wheezing or shortness of breath.    albuterol (VENTOLIN HFA) 108 (90 Base) MCG/ACT inhaler Inhale 1-2 puffs into the lungs every 6 (six) hours as needed for wheezing or shortness of breath.   aspirin EC 81 MG tablet Take 81 mg by mouth daily. Swallow whole.   Blood Glucose Monitoring Suppl (FREESTYLE LITE) w/Device KIT Use to check blood sugar up to 4 times a day as directed   budesonide (ENTOCORT EC) 3 MG 24 hr capsule Take 3 capsules (9 mg total) by mouth daily.   busPIRone (BUSPAR) 10 MG tablet Take 1 tablet (10 mg total) by mouth 2 (two) times daily.   colestipol (COLESTID) 1 g tablet Take 1 tablet by mouth daily.   Continuous Blood Gluc Sensor (FREESTYLE LIBRE 14 DAY SENSOR) MISC Use to monitor sugars   Continuous Blood Gluc Sensor (FREESTYLE LIBRE SENSOR SYSTEM) MISC Use for blood sugar monitoring   cyanocobalamin (,VITAMIN B-12,) 1000 MCG/ML injection 1 ML  B12 injection every 2 weeks for 3 months then decrease to 1 B12 injection to once a month   dicyclomine (  BENTYL) 20 MG tablet Take 1 tablet (20 mg total) by mouth every 6 (six) hours.   DULoxetine (CYMBALTA) 60 MG capsule Take 1 capsule (60 mg total) by mouth daily.   enalapril (VASOTEC) 10 MG tablet Take 1 tablet (10 mg total) by mouth daily.   fenofibrate (TRICOR) 145 MG tablet TAKE ONE TABLET BY MOUTH ONCE DAILY   fluorouracil (EFUDEX) 5 % cream    furosemide (LASIX) 40 MG tablet Take  1 tablet (40 mg total) by mouth daily as needed for fluid or edema.   glimepiride (AMARYL) 2 MG tablet Take 1 tablet (2 mg total) by mouth daily before breakfast.   glucose blood (TRUE METRIX BLOOD GLUCOSE TEST) test strip Use to check blood sugar up to 4 times a day as directed.   insulin glargine-yfgn (SEMGLEE, YFGN,) 100 UNIT/ML Pen Inject 48-55 Units into the skin daily.   Insulin Pen Needle (PEN NEEDLES 3/16") 31G X 5 MM MISC Use daily   Lancets (FREESTYLE) lancets Use to check blood sugar up to 4 times a day as directed   levocetirizine (XYZAL) 5 MG tablet Take 1 tablet  by mouth every evening.   metFORMIN (GLUCOPHAGE-XR) 500 MG 24 hr tablet Take 2 tablets by mouth daily.   metoprolol succinate (TOPROL-XL) 25 MG 24 hr tablet Take 1 & 1/2 tablets (37.5 mg total) by mouth daily.   montelukast (SINGULAIR) 10 MG tablet Take 1 tablet by mouth every evening.   Multiple Vitamins-Minerals (MULTIVITAMIN WITH MINERALS) tablet Take 1 tablet by mouth daily.   nitroGLYCERIN (NITROSTAT) 0.4 MG SL tablet Place 1 tablet (0.4 mg total) under the tongue every 5 (five) minutes as needed for chest pain.  Call 911 if no relief after 1st dose.   ondansetron (ZOFRAN ODT) 4 MG disintegrating tablet Take 1 tablet (4 mg total) by mouth every 4 (four) hours as needed for nausea or vomiting.   pantoprazole (PROTONIX) 40 MG tablet Take 1 tablet (40 mg total) by mouth daily.   Probiotic Product (PROBIOTIC DAILY PO) Take 1 capsule by mouth daily.   Risankizumab-rzaa (SKYRIZI) 360 MG/2.4ML SOCT Week 12 begin 360 mg SQ injection by cartridge and repeat every 8 weeks   sodium chloride (OCEAN) 0.65 % SOLN nasal spray Place 1 spray into both nostrils daily as needed for congestion.   sucralfate (CARAFATE) 1 g tablet Take 1 tablet (1 g total) by mouth 3 (three) times daily before meals.   Vitamin D3 (VITAMIN D) 25 MCG tablet Take 1,000 Units by mouth at bedtime.   [DISCONTINUED] Evolocumab (REPATHA SURECLICK) 638 MG/ML SOAJ  Inject the contents of 1 pen into the skin every 14 days as directed.   [DISCONTINUED] VASCEPA 1 g capsule Take 2 capsules by mouth 2 times daily.   risankizumab-rzaa (SKYRIZI) 600 MG/10ML injection 600 mg IV induction at week 0,4,& 8 (Patient not taking: Reported on 12/22/2021)   [DISCONTINUED] EPIPEN 2-PAK 0.3 MG/0.3ML SOAJ injection Inject 0.3 mg into the muscle as needed for anaphylaxis. Reported on 06/27/2015   [DISCONTINUED] insulin glargine (LANTUS) 100 UNIT/ML injection Inject 0.48 mLs (48 Units total) into the skin at bedtime.   No facility-administered encounter medications on file as of 12/22/2021.    Allergies as of 12/22/2021 - Review Complete 12/22/2021  Allergen Reaction Noted   Lipitor [atorvastatin] Other (See Comments) 03/28/2013   Remicade [infliximab]     Ticagrelor  09/16/2017   Rosuvastatin Other (See Comments) 01/25/2019    Past Medical History:  Diagnosis Date   Allergic rhinitis  due to pollen    Allergy    Anxiety state, unspecified    Arthritis    Bronchitis    hx   CAD (coronary artery disease)    s/p NSTEMI in 11/18 treated with a DES to the proximal LAD   Calculus of gallbladder without mention of cholecystitis or obstruction    Crohn disease ([place])    Depressive disorder, not elsewhere classified    Diabetes mellitus without complication ([place])    diet controlled   Esophageal reflux    Fibromyalgia    Grade I diastolic dysfunction    Noted on ECHO   Headache    History of nuclear stress test    ETT-Myoview 10/17: EF 55%, normal perfusion; Low Risk // Nuclear stress test 05/2018:  EF 63, normal perfusion; Low Risk   Myalgia and myositis, unspecified    NSTEMI (non-ST elevated myocardial infarction) ([place]) 04/15/2017   Other and unspecified hyperlipidemia    Palpitations    Pneumonia 15   hx   S/P angioplasty with stent 04/18/17 with DES to pLAD 04/19/2017   Seizures ([place])    1 seizure as a teenager    Unspecified essential hypertension    Vitamin  B deficiency     Past Surgical History:  Procedure Laterality Date   BIOPSY  12/05/2018   Procedure: BIOPSY;  Surgeon: Mauri Pole, MD;  Location: [facility];  Service: Endoscopy;;   CHOLECYSTECTOMY N/A 09/19/2014   Procedure: LAPAROSCOPIC CHOLECYSTECTOMY WITH INTRAOPERATIVE CHOLANGIOGRAM;  Surgeon: Donnie Mesa, MD;  Location: [facility];  Service: General;  Laterality: N/A;   COLONOSCOPY WITH PROPOFOL N/A 12/05/2018   Procedure: COLONOSCOPY WITH PROPOFOL;  Surgeon: Mauri Pole, MD;  Location: [facility];  Service: Endoscopy;  Laterality: N/A;  chromoendoscopy   CORONARY STENT INTERVENTION N/A 04/18/2017   Procedure: CORONARY STENT INTERVENTION;  Surgeon: Martinique, Peter M, MD;  Location: [facility];  Service: Cardiovascular;  Laterality: N/A;   ESOPHAGOGASTRODUODENOSCOPY     EXPLORATORY LAPAROTOMY     for endometriosis   INTRAVASCULAR PRESSURE WIRE/FFR STUDY N/A 04/18/2017   Procedure: INTRAVASCULAR PRESSURE WIRE/FFR STUDY;  Surgeon: Martinique, Peter M, MD;  Location: [facility];  Service: Cardiovascular;  Laterality: N/A;   LEFT HEART CATH AND CORONARY ANGIOGRAPHY N/A 04/18/2017   Procedure: LEFT HEART CATH AND CORONARY ANGIOGRAPHY;  Surgeon: Martinique, Peter M, MD;  Location: [facility];  Service: Cardiovascular;  Laterality: N/A;   POLYPECTOMY  12/05/2018   Procedure: POLYPECTOMY;  Surgeon: Mauri Pole, MD;  Location: [facility];  Service: Endoscopy;;   SUBMUCOSAL LIFTING INJECTION  12/05/2018   Procedure: SUBMUCOSAL LIFTING INJECTION;  Surgeon: Mauri Pole, MD;  Location: [facility];  Service: Endoscopy;;   TONSILLECTOMY AND ADENOIDECTOMY     TUBAL LIGATION     BILATERAL    Family History  Problem Relation Age of Onset   Lung cancer Maternal Grandmother    Stroke Father    Hypertension Mother    Heart attack Mother    Heart disease Mother    Hypertension Brother    Heart disease Brother    Hypertension Sister    Heart disease  Sister    Coronary artery disease Brother    Colon cancer Neg Hx    Stomach cancer Neg Hx    Esophageal cancer Neg Hx    Rectal cancer Neg Hx     Social History   Socioeconomic History   Marital status: Married    Spouse name: Dane  Number of children: 2   Years of education: 12+   Highest education level: Not on file  Occupational History   Occupation: ACCOUNT REP.    Employer: [employer]    Comment: retired  Tobacco Use   Smoking status: Former    Packs/day: 1.00    Years: 35.00    Total pack years: 35.00    Types: Cigarettes    Quit date: 05/31/2004    Years since quitting: 17.5   Smokeless tobacco: Never  Vaping Use   Vaping Use: Never used  Substance and Sexual Activity   Alcohol use: No   Drug use: No   Sexual activity: Not Currently    Comment: married  Other Topics Concern   Not on file  Social History Narrative   Lives with her husband and their pets. Her children are adults and live independently.   Social Determinants of Health   Financial Resource Strain: Not on file  Food Insecurity: Not on file  Transportation Needs: Not on file  Physical Activity: Not on file  Stress: Not on file  Social Connections: Not on file  Intimate Partner Violence: Not on file      Review of systems: All other review of systems negative except as mentioned in the HPI.   Physical Exam: Vitals:   12/22/21 0824  BP: 124/68  Pulse: 73  SpO2: 97%   Body mass index is 25.61 kg/m. Gen:      No acute distress HEENT:  sclera anicteric Abd:      soft, non-tender; no palpable masses, no distension Ext:    No edema Neuro: alert and oriented x 3 Psych: normal mood and affect  Data Reviewed:  Reviewed labs, radiology imaging, old records and pertinent past GI work up   Assessment and Plan/Recommendations:  61 year old very pleasant female with history of Crohn's disease, diabetes and CAD here for follow up visit, currently in clinical remission  on [place].    She had clinical evidence of Entyvio, had active Ileitis based on recent colonoscopy.  Overall GI symptoms have improved significantly on Skyrizi.  She is currently off the rest night with improvement of A1c   Use Colestid 1 g up to 2 times daily as needed, advised patient to avoid taking it within 2 to 3 hours of other medications to prevent drug interaction   Elevated transaminases secondary to steatohepatitis, continue to monitor Continue with low-carb and low-fat diet.  Continue daily exercise   Vitamin B12 deficiency: Continue B12 maintenance injections  IBD health maintenance: Follow-up CBC, CMP, CRP, B12 and folate   Return in 3 months    This visit required 40 minutes of patient care (this includes precharting, chart review, review of results, face-to-face time used for counseling as well as treatment plan and follow-up. The patient was provided an opportunity to ask questions and all were answered. The patient agreed with the plan and demonstrated an understanding of the instructions.  Damaris Hippo , MD    CC: Hoyt Koch, *

## 2021-12-28 NOTE — Telephone Encounter (Signed)
Note from insurance says patient needs to use brand vascepa. I will make sure patient was able to get at pharmacy and use coupon.  Called pt. Pharmacy was able to fill and cost was $9 for 90 days

## 2022-01-01 ENCOUNTER — Other Ambulatory Visit (HOSPITAL_COMMUNITY): Payer: Self-pay

## 2022-01-05 DIAGNOSIS — K50813 Crohn's disease of both small and large intestine with fistula: Secondary | ICD-10-CM | POA: Diagnosis not present

## 2022-01-15 ENCOUNTER — Other Ambulatory Visit (HOSPITAL_COMMUNITY): Payer: Self-pay

## 2022-01-15 ENCOUNTER — Other Ambulatory Visit: Payer: Self-pay | Admitting: Internal Medicine

## 2022-01-18 ENCOUNTER — Telehealth: Payer: Self-pay | Admitting: Internal Medicine

## 2022-01-18 NOTE — Telephone Encounter (Signed)
Pt is wanting to know if Dr. Sharlet Salina thinks it is ok for her to take Neotonics. She said it is a probiotic for gut health and she wants to make sure it will not cause an interaction with any of her prescriptions medications. She said she does not think it, but she just wants to be sure before taking it.  Please advise

## 2022-01-19 ENCOUNTER — Other Ambulatory Visit (HOSPITAL_COMMUNITY): Payer: Self-pay

## 2022-01-19 NOTE — Telephone Encounter (Signed)
Ok to try

## 2022-01-20 ENCOUNTER — Other Ambulatory Visit (HOSPITAL_COMMUNITY): Payer: Self-pay

## 2022-01-20 ENCOUNTER — Other Ambulatory Visit: Payer: Self-pay | Admitting: Internal Medicine

## 2022-01-20 NOTE — Telephone Encounter (Signed)
Spoke with pt and was bale to inform her it was okay to try the probiotic

## 2022-01-23 ENCOUNTER — Other Ambulatory Visit (HOSPITAL_COMMUNITY): Payer: Self-pay

## 2022-01-24 IMAGING — DX DG CHEST 1V PORT
1 series · 1 of 1 positions shown · non-contrast
Comparison: 04/23/2019 and CT chest 04/29/2011.

CLINICAL DATA: COVID positive. Abnormal breath sounds. Fever, cough
and back pain.

EXAM:
PORTABLE CHEST 1 VIEW

[chest ap]
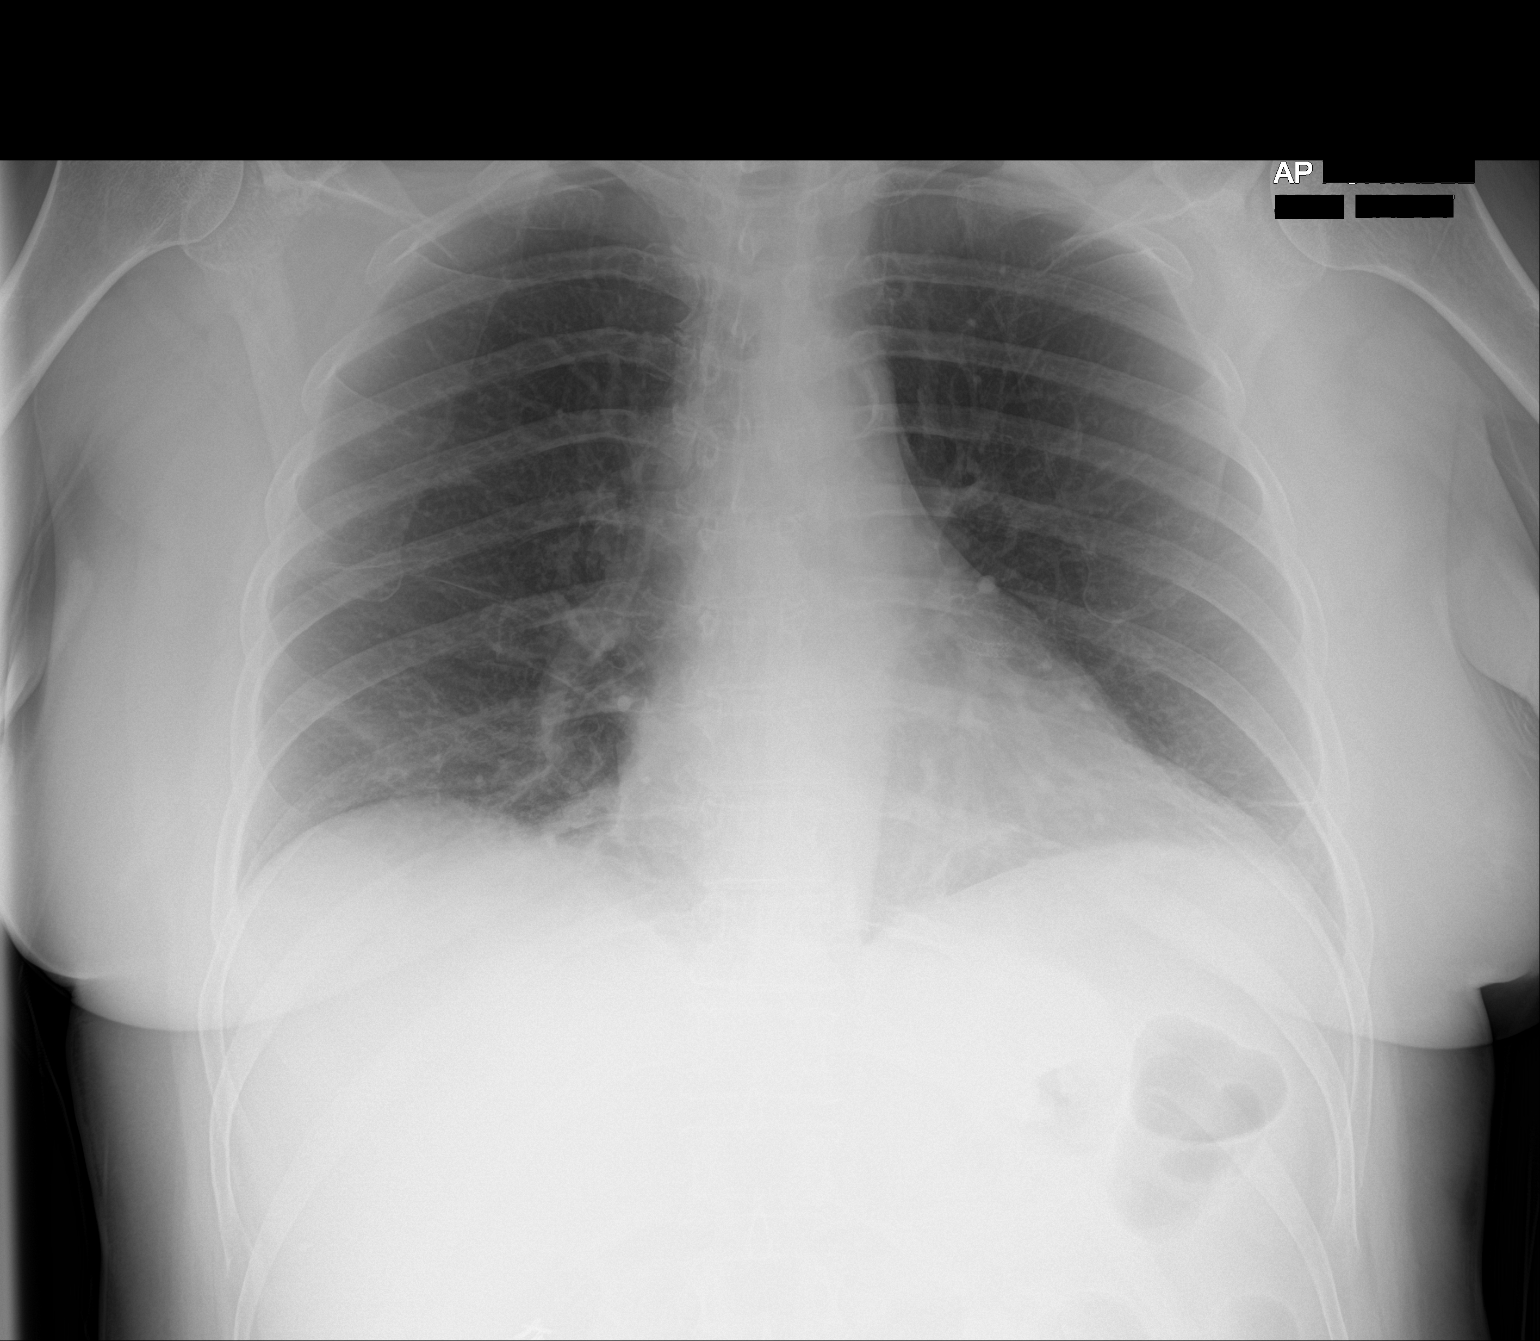

[1 of 1 positions shown; findings below may reference images not displayed]

FINDINGS: Trachea is midline. Heart size normal. Thoracic aorta is calcified.
Streaky opacities at the left lung base appears similar to
04/23/2019. There may be minimal right basilar hazy airspace
opacification. Lungs are otherwise clear. No pleural fluid.
IMPRESSION: 1. Minimal hazy opacification of the right lung base may be due to
atelectasis. Difficult to exclude [id number] pneumonia.
2. Favor scarring in the left lung base.

## 2022-01-25 ENCOUNTER — Telehealth: Payer: Self-pay | Admitting: Internal Medicine

## 2022-01-25 ENCOUNTER — Other Ambulatory Visit (HOSPITAL_COMMUNITY): Payer: Self-pay

## 2022-01-25 NOTE — Telephone Encounter (Signed)
Caller & Relationship to patient: [name] - patient  Call back number: [phone]  Date of last office visit: 12-16-21  Date of next office visit: 03-19-22  Medication(s) to be refilled:  levocetirizine Harlow Ohms) 5 MG tablet   Preferred Pharmacy: [facility] Phone:  [phone]  Fax:  [phone]      Patient states that pharmacy has been trying to get this refilled for two weeks. She is completely out of medication.

## 2022-01-25 NOTE — Telephone Encounter (Signed)
Also needing  glimepiride (AMARYL) 2 MG tablet

## 2022-01-26 ENCOUNTER — Other Ambulatory Visit (HOSPITAL_COMMUNITY): Payer: Self-pay

## 2022-01-26 ENCOUNTER — Other Ambulatory Visit: Payer: Self-pay

## 2022-01-26 MED ORDER — GLIMEPIRIDE 2 MG PO TABS
2.0000 mg | ORAL_TABLET | Freq: Every day | ORAL | 3 refills | Status: DC
  Filled 2022-01-26: qty 30, 30d supply, fill #0
  Filled 2022-02-24: qty 30, 30d supply, fill #1
  Filled 2022-03-22: qty 30, 30d supply, fill #2
  Filled 2022-04-23: qty 30, 30d supply, fill #3

## 2022-01-26 MED ORDER — LEVOCETIRIZINE DIHYDROCHLORIDE 5 MG PO TABS
5.0000 mg | ORAL_TABLET | Freq: Every evening | ORAL | 0 refills | Status: DC
  Filled 2022-01-26: qty 30, 30d supply, fill #0
  Filled 2022-02-24: qty 30, 30d supply, fill #1
  Filled 2022-03-22: qty 30, 30d supply, fill #2

## 2022-01-28 ENCOUNTER — Other Ambulatory Visit: Payer: Self-pay | Admitting: Internal Medicine

## 2022-01-28 ENCOUNTER — Other Ambulatory Visit (HOSPITAL_COMMUNITY): Payer: Self-pay

## 2022-02-02 ENCOUNTER — Other Ambulatory Visit: Payer: Self-pay | Admitting: Internal Medicine

## 2022-02-02 ENCOUNTER — Other Ambulatory Visit (HOSPITAL_COMMUNITY): Payer: Self-pay

## 2022-02-02 DIAGNOSIS — K50813 Crohn's disease of both small and large intestine with fistula: Secondary | ICD-10-CM | POA: Diagnosis not present

## 2022-02-02 MED ORDER — INSULIN GLARGINE-YFGN 100 UNIT/ML ~~LOC~~ SOPN
48.0000 [IU] | PEN_INJECTOR | Freq: Every day | SUBCUTANEOUS | 3 refills | Status: DC
  Filled 2022-02-02: qty 15, 27d supply, fill #0
  Filled 2022-02-24: qty 15, 27d supply, fill #1
  Filled 2022-03-22: qty 15, 27d supply, fill #2
  Filled 2022-04-23: qty 15, 27d supply, fill #3
  Filled 2022-05-18: qty 15, 27d supply, fill #4
  Filled 2022-06-18 (×2): qty 15, 27d supply, fill #5
  Filled 2022-07-13: qty 15, 27d supply, fill #6
  Filled 2022-08-23: qty 15, 27d supply, fill #7

## 2022-02-03 ENCOUNTER — Other Ambulatory Visit (HOSPITAL_COMMUNITY): Payer: Self-pay

## 2022-02-03 ENCOUNTER — Other Ambulatory Visit: Payer: Self-pay

## 2022-02-03 ENCOUNTER — Telehealth: Payer: Self-pay | Admitting: Gastroenterology

## 2022-02-03 MED ORDER — DULOXETINE HCL 60 MG PO CPEP
60.0000 mg | ORAL_CAPSULE | Freq: Every day | ORAL | 1 refills | Status: DC
  Filled 2022-02-03: qty 30, 30d supply, fill #0
  Filled 2022-03-22: qty 30, 30d supply, fill #1
  Filled 2022-04-23: qty 30, 30d supply, fill #2
  Filled 2022-05-18: qty 30, 30d supply, fill #3
  Filled 2022-06-18: qty 30, 30d supply, fill #4
  Filled 2022-07-13 – 2022-07-15 (×3): qty 30, 30d supply, fill #5

## 2022-02-03 MED ORDER — SKYRIZI 360 MG/2.4ML ~~LOC~~ SOCT
SUBCUTANEOUS | 7 refills | Status: DC
  Filled 2022-02-03: qty 2.4, fill #0

## 2022-02-03 NOTE — Telephone Encounter (Signed)
Inbound call from patient stating that she has finished Skyrizi infusions and she needs to do her Skyrizi injections herself and she needs the medication in between  27th  of September and October 2nd [facility]

## 2022-02-04 ENCOUNTER — Other Ambulatory Visit (HOSPITAL_COMMUNITY): Payer: Self-pay

## 2022-02-04 ENCOUNTER — Telehealth: Payer: Self-pay | Admitting: Pharmacy Technician

## 2022-02-04 NOTE — Telephone Encounter (Signed)
Patient Advocate Encounter  Received notification that prior authorization for SKYRIZI 360MG is required.   PA submitted on 9.7.23 Key [id number] Status is pending    Luciano Cutter, CPhT Patient Advocate Phone: [phone]

## 2022-02-05 ENCOUNTER — Other Ambulatory Visit (HOSPITAL_COMMUNITY): Payer: Self-pay

## 2022-02-08 ENCOUNTER — Other Ambulatory Visit (HOSPITAL_COMMUNITY): Payer: Self-pay

## 2022-02-08 ENCOUNTER — Other Ambulatory Visit: Payer: Self-pay

## 2022-02-08 ENCOUNTER — Telehealth: Payer: Self-pay

## 2022-02-08 MED ORDER — SKYRIZI 360 MG/2.4ML ~~LOC~~ SOCT: SUBCUTANEOUS | 7 refills | Status: DC

## 2022-02-08 NOTE — Telephone Encounter (Signed)
Patient Advocate Encounter  Prior Authorization for [facility] has been approved.    PA# ---- Effective dates: 9.7.23 through 9.5.24  Hermen Mario B. CPhT P: [phone] F: [phone]

## 2022-02-08 NOTE — Telephone Encounter (Signed)
Received notification from Rx PA team the patient's insurance has approved Skyrizi on-body injector. Rx transmitted to [facility].  Called patient. No answer. Left the information on her voicemail. Patient instructed to call soon for a follow up appointment.

## 2022-02-15 ENCOUNTER — Telehealth: Payer: Self-pay | Admitting: Gastroenterology

## 2022-02-15 ENCOUNTER — Other Ambulatory Visit (HOSPITAL_COMMUNITY): Payer: Self-pay

## 2022-02-15 NOTE — Telephone Encounter (Signed)
Patient returned your call. Requesting a call back. Please  call to advise.

## 2022-02-16 NOTE — Telephone Encounter (Signed)
The patient needs a follow up appointment with Dr Silverio Decamp or with an APP scheduled. Left her a message asking she call again and ask for an appointment.

## 2022-02-24 ENCOUNTER — Other Ambulatory Visit (HOSPITAL_COMMUNITY): Payer: Self-pay

## 2022-02-25 ENCOUNTER — Other Ambulatory Visit (HOSPITAL_COMMUNITY): Payer: Self-pay

## 2022-02-25 DIAGNOSIS — L821 Other seborrheic keratosis: Secondary | ICD-10-CM | POA: Diagnosis not present

## 2022-02-25 DIAGNOSIS — D225 Melanocytic nevi of trunk: Secondary | ICD-10-CM | POA: Diagnosis not present

## 2022-02-25 DIAGNOSIS — L570 Actinic keratosis: Secondary | ICD-10-CM | POA: Diagnosis not present

## 2022-02-25 DIAGNOSIS — L814 Other melanin hyperpigmentation: Secondary | ICD-10-CM | POA: Diagnosis not present

## 2022-03-02 ENCOUNTER — Other Ambulatory Visit (HOSPITAL_COMMUNITY): Payer: Self-pay

## 2022-03-02 ENCOUNTER — Encounter: Payer: Self-pay | Admitting: Internal Medicine

## 2022-03-02 ENCOUNTER — Ambulatory Visit: Payer: BC Managed Care – PPO | Admitting: Internal Medicine

## 2022-03-02 VITALS — BP 138/82 | HR 74 | Ht 62.0 in | Wt 141.0 lb

## 2022-03-02 DIAGNOSIS — J029 Acute pharyngitis, unspecified: Secondary | ICD-10-CM

## 2022-03-02 DIAGNOSIS — Z23 Encounter for immunization: Secondary | ICD-10-CM | POA: Diagnosis not present

## 2022-03-02 DIAGNOSIS — M25521 Pain in right elbow: Secondary | ICD-10-CM

## 2022-03-02 DIAGNOSIS — E118 Type 2 diabetes mellitus with unspecified complications: Secondary | ICD-10-CM

## 2022-03-02 DIAGNOSIS — K50813 Crohn's disease of both small and large intestine with fistula: Secondary | ICD-10-CM

## 2022-03-02 LAB — POCT GLYCOSYLATED HEMOGLOBIN (HGB A1C): Hemoglobin A1C: 9.5 % — AB (ref 4.0–5.6)

## 2022-03-02 MED ORDER — EMPAGLIFLOZIN 25 MG PO TABS
25.0000 mg | ORAL_TABLET | Freq: Every day | ORAL | 3 refills | Status: DC
  Filled 2022-03-02: qty 30, 30d supply, fill #0
  Filled 2022-04-23: qty 30, 30d supply, fill #1
  Filled 2022-05-20: qty 30, 30d supply, fill #2
  Filled 2022-09-07: qty 30, 30d supply, fill #3

## 2022-03-02 NOTE — Progress Notes (Signed)
   Subjective:   Patient ID: [name], female    DOB: 1960/10/19, 61 y.o.   MRN: [id number]  HPI The patient is a 61 YO female coming in for follow up with concerns.  Review of Systems  Constitutional: Negative.   HENT:  Positive for congestion and sore throat.   Eyes: Negative.   Respiratory:  Negative for cough, chest tightness and shortness of breath.   Cardiovascular:  Negative for chest pain, palpitations and leg swelling.  Gastrointestinal:  Negative for abdominal distention, abdominal pain, constipation, diarrhea, nausea and vomiting.  Musculoskeletal:  Positive for arthralgias and myalgias.  Skin: Negative.   Neurological: Negative.   Psychiatric/Behavioral: Negative.      Objective:  Physical Exam Constitutional:      Appearance: She is well-developed.  HENT:     Head: Normocephalic and atraumatic.     Comments: Oropharynx with redness and clear drainage, nose with swollen turbinates, TMs normal bilaterally.  Neck:     Thyroid: No thyromegaly.  Cardiovascular:     Rate and Rhythm: Normal rate and regular rhythm.  Pulmonary:     Effort: Pulmonary effort is normal. No respiratory distress.     Breath sounds: Normal breath sounds. No wheezing or rales.  Abdominal:     General: Bowel sounds are normal. There is no distension.     Palpations: Abdomen is soft.     Tenderness: There is no abdominal tenderness. There is no rebound.  Musculoskeletal:        General: Tenderness present.     Cervical back: Normal range of motion.     Comments: Bone spur right elbow  Lymphadenopathy:     Cervical: No cervical adenopathy.  Skin:    General: Skin is warm and dry.  Neurological:     Mental Status: She is alert and oriented to person, place, and time.     Coordination: Coordination normal.     Vitals:   03/02/22 0804  BP: 138/82  Pulse: 74  SpO2: 95%  Weight: 141 lb (64 kg)  Height: 5' 2"  (1.575 m)    Assessment & Plan:  Flu shot given at visit

## 2022-03-02 NOTE — Assessment & Plan Note (Signed)
Suspect bone spur on the right elbow palpable on exam. X-ray would not be helpful. Advised topical voltaren gel. Given poorly controlled diabetes steroids would not be indicated and would avoid steroid injection if possible.

## 2022-03-02 NOTE — Assessment & Plan Note (Signed)
Previously poorly controlled and worse today. She states off budesonide for several months at least so it is uncertain if that is playing a role. She states diet poor at least last month due to travel. Offered referral to nutrition but she declines does not think this would be helpful. She is taking semglee 58 units nightly and we talked about increasing but she would rather try another agent. We will add jardiance 25 mg daily. Continue metformin 1000 mg daily (declined increase due to symptoms), amaryl 2 mg daily and semglee 58 units daily. She is on ACE-I and repatha. Not on statin due to intolerance. Counseled about risk of complications with RPR9Y above goal of 7.5.

## 2022-03-02 NOTE — Assessment & Plan Note (Signed)
Suspect viral. Offered covid-19 testing but she declines stating she did home test once. Symptoms for 1-2 days. Not suspicious for strep throat. Likely viral or allergic so antibiotics not indicated. Would not do steroids given poorly controlled diabetes. Offered lidocaine viscous but she declines. Continue allergy medication as prescribed montelukast and xyzal.

## 2022-03-02 NOTE — Assessment & Plan Note (Signed)
We talked about how budesonide can cause elevated sugars and she is not sure when she stopped this but knows she is not taking at least last few months. She is on skyrizi and this is causing intermittent itching. She will reach out to GI for help if this should continue or be adjusted.

## 2022-03-02 NOTE — Patient Instructions (Addendum)
We have sent in jardiance to take 1 pill daily to help the sugars go down.  Come back in 3 months so we can recheck the sugars. The longer the sugars are above goal the more at risk you are for complications of diabetes like kidney problems, numbness, vision problems.

## 2022-03-03 ENCOUNTER — Other Ambulatory Visit (HOSPITAL_COMMUNITY): Payer: Self-pay

## 2022-03-03 ENCOUNTER — Telehealth: Payer: Self-pay

## 2022-03-03 ENCOUNTER — Telehealth: Payer: Self-pay | Admitting: Gastroenterology

## 2022-03-03 MED ORDER — PROMETHAZINE-DM 6.25-15 MG/5ML PO SYRP
5.0000 mL | ORAL_SOLUTION | Freq: Four times a day (QID) | ORAL | 0 refills | Status: DC | PRN
  Filled 2022-03-03: qty 118, 6d supply, fill #0

## 2022-03-03 NOTE — Addendum Note (Signed)
Addended by: Pricilla Holm A on: 03/03/2022 09:04 AM   Modules accepted: Orders

## 2022-03-03 NOTE — Telephone Encounter (Signed)
LVM--regarding medication sent to the pharmacy.

## 2022-03-03 NOTE — Telephone Encounter (Signed)
Patient was seen yesterday for a visit and was offered a prescription for cough medicine, [name] would like to know if she can get cough syrup with codeine. Asked for the prescription to be sent to [facility].

## 2022-03-03 NOTE — Telephone Encounter (Signed)
Please advise 

## 2022-03-03 NOTE — Telephone Encounter (Signed)
Sent in cough medicine to [facility]

## 2022-03-03 NOTE — Telephone Encounter (Signed)
Inbound call from patient requesting a call back to discuss Skyrzi. Please advise.

## 2022-03-04 NOTE — Telephone Encounter (Signed)
Patient is returning your call.  

## 2022-03-04 NOTE — Telephone Encounter (Signed)
Called the patient back. No answer. Left her a message of my call.

## 2022-03-05 NOTE — Telephone Encounter (Signed)
Called the patient back. No answer. Left a message of my return call on her voicemail.

## 2022-03-05 NOTE — Telephone Encounter (Signed)
Erroneous encounter

## 2022-03-09 ENCOUNTER — Other Ambulatory Visit (HOSPITAL_COMMUNITY): Payer: Self-pay

## 2022-03-12 NOTE — Telephone Encounter (Signed)
Spoke with the patient. Last infusion to complete the induction of Skyrizi was 02/02/22 at [facility]. The patient then went out of town to [place]. She reports she developed intense itching which she thought was due to the water in [place]. "Lots of chlorine in that water." She return home the following week. The itching did not improve. She denies any rash of redness of her skin. She itches "all over." She has not started the on body infusions of Skyrizi.

## 2022-03-17 NOTE — Telephone Encounter (Signed)
[name] is not known to cause allergic reaction, less likely etiology for itching.  Please advise patient to check for any other sources of itching, bug bites or any allergic reaction to other medications.  Please advise her to start maintenance dose as per schedule.  Follow-up in office visit next available appointment.  Thank you

## 2022-03-17 NOTE — Telephone Encounter (Signed)
Called patient to discuss. No answer. Left a message to call me back.

## 2022-03-19 ENCOUNTER — Ambulatory Visit: Payer: BC Managed Care – PPO | Admitting: Internal Medicine

## 2022-03-22 ENCOUNTER — Other Ambulatory Visit (HOSPITAL_COMMUNITY): Payer: Self-pay

## 2022-03-22 NOTE — Telephone Encounter (Signed)
Called to discuss. No answer. Left voicemail.

## 2022-03-23 NOTE — Telephone Encounter (Signed)
Spoke with the patient. Advised her the provider does want her to take the [facility]. She is late taking it by a couple of weeks. Confirmed with Dr Silverio Decamp to go forward with her dose.

## 2022-03-24 ENCOUNTER — Other Ambulatory Visit (HOSPITAL_COMMUNITY): Payer: Self-pay

## 2022-04-06 ENCOUNTER — Encounter: Payer: Self-pay | Admitting: *Deleted

## 2022-04-06 ENCOUNTER — Telehealth: Payer: Self-pay | Admitting: *Deleted

## 2022-04-06 NOTE — Patient Instructions (Signed)
Visit Information  Thank you for taking time to visit with me today. Please don't hesitate to contact me if I can be of assistance to you.   Following are the goals we discussed today:   Goals Addressed             This Visit's Progress    COMPLETED: Care Coordination Activities: No follow up required   On track    Care Coordination Interventions: Evaluation of current treatment plan related to DMII/ depression and patient's adherence to plan as established by provider Advised patient to provide appropriate vaccination information to provider or CM team member at next visit Advised patient to discuss ongoing feelings of depression/ sadness with PCP; offered to place referral to LCSW for counseling resources- patient declined Provided patient with MyChart educational materials related to depression Reviewed scheduled/upcoming provider appointments including 05/25/22- cardiology provider Advised patient to discuss ongoing symptoms of allergies/ possible new treatment with provider Screening for signs and symptoms of depression related to chronic disease state  Assessed social determinant of health barriers Confirmed patient has flu vaccine for 2023-24 winter/ flu season; reviewed recent blood sugars at home with patient; confirmed patient has no current medication concerns/ questions          If you are experiencing a Mental Health or [place] or need someone to talk to, please  call the Suicide and Crisis Lifeline: 988 call the [place] National Suicide Prevention Lifeline: 707-653-9381 or TTY: (339)137-7182 TTY 830-742-7373) to talk to a trained counselor call 1-800-273-TALK (toll free, 24 hour hotline) go to [facility] [address], [place] [phone]) call the [place]: [phone] call 911   Patient verbalizes understanding of instructions and care plan provided today and agrees to view in  [place]. Active MyChart status and patient understanding of how to access instructions and care plan via MyChart confirmed with patient.     No further follow up required: patient denies further/ ongoing care coordination needs  Oneta Rack, RN, BSN, CCRN Alumnus RN CM Care Coordination/ Transition of [facility] [phone]: direct office

## 2022-04-06 NOTE — Patient Outreach (Signed)
  Care Coordination   Initial Visit Note   04/06/2022 Name: [name] MRN: [id number] DOB: Jan 25, 1961  [name] is a 61 y.o. year old female who sees Hoyt Koch, MD for primary care. I spoke with  [name] by phone today.  What matters to the patients health and wellness today?  "I am doing fine; no really big changes; my fasting blood sugars are doing great-- I am seeing numbers mainly between 80-120; occasionally they might go up in to the 150's, but not often.  I am taking the jardiance.  No medication concerns or questions.  I feel sad mainly because my entire family is in [place] and I am here in [place]-- I just miss them; moving back to [place] is what would fix my sadness; so I don't need any counseling, I just need to move back to [place]-- but I can't, because my husband won't go with me.  My allergy problem has not changed-- I just have so much drainage.  I am going to talk to Dr. Sharlet Salina when I see her again to see if there is something I can take; meanwhile, I am just taking OTC meds here and there."  Interventions provided; no further or ongoing care coordination needs identified today   Goals Addressed             This Visit's Progress    Care Coordination Activities: No follow up required   On track    Care Coordination Interventions: Evaluation of current treatment plan related to DMII/ depression and patient's adherence to plan as established by provider Advised patient to provide appropriate vaccination information to provider or CM team member at next visit Advised patient to discuss ongoing feelings of depression/ sadness with PCP; offered to place referral to LCSW for counseling resources- patient declined Provided patient with MyChart educational materials related to depression Reviewed scheduled/upcoming provider appointments including 05/25/22- cardiology provider Advised patient to discuss ongoing symptoms of allergies/ possible new treatment with  provider Screening for signs and symptoms of depression related to chronic disease state  Assessed social determinant of health barriers Confirmed patient has flu vaccine for 2023-24 winter/ flu season; reviewed recent blood sugars at home with patient; confirmed patient has no current medication concerns/ questions          SDOH assessments and interventions completed:  Yes  SDOH Interventions Today    Flowsheet Row Most Recent Value  SDOH Interventions   Food Insecurity Interventions Intervention Not Indicated  Transportation Interventions Intervention Not Indicated  [drives self]  Depression Interventions/Treatment  Medication  [patient declines referral to LCSW for counseling options]       Care Coordination Interventions Activated:  Yes  Care Coordination Interventions:  Yes, provided   Follow up plan: No further intervention required.   Encounter Outcome:  Pt. Visit Completed   Oneta Rack, RN, BSN, CCRN Alumnus RN CM Care Coordination/ Transition of [facility] [facility] [phone]: direct office

## 2022-04-08 ENCOUNTER — Other Ambulatory Visit (HOSPITAL_COMMUNITY): Payer: Self-pay

## 2022-04-12 ENCOUNTER — Other Ambulatory Visit (HOSPITAL_COMMUNITY): Payer: Self-pay

## 2022-04-23 ENCOUNTER — Other Ambulatory Visit: Payer: Self-pay | Admitting: Internal Medicine

## 2022-04-23 ENCOUNTER — Other Ambulatory Visit (HOSPITAL_COMMUNITY): Payer: Self-pay

## 2022-04-27 ENCOUNTER — Other Ambulatory Visit (HOSPITAL_COMMUNITY): Payer: Self-pay

## 2022-04-27 MED ORDER — LEVOCETIRIZINE DIHYDROCHLORIDE 5 MG PO TABS
5.0000 mg | ORAL_TABLET | Freq: Every evening | ORAL | 0 refills | Status: DC
  Filled 2022-04-27: qty 30, 30d supply, fill #0
  Filled 2022-05-20: qty 30, 30d supply, fill #1
  Filled 2022-07-05: qty 30, 30d supply, fill #2

## 2022-04-29 ENCOUNTER — Other Ambulatory Visit (HOSPITAL_COMMUNITY): Payer: Self-pay

## 2022-05-04 DIAGNOSIS — M13842 Other specified arthritis, left hand: Secondary | ICD-10-CM | POA: Diagnosis not present

## 2022-05-04 DIAGNOSIS — M79645 Pain in left finger(s): Secondary | ICD-10-CM | POA: Diagnosis not present

## 2022-05-05 ENCOUNTER — Telehealth: Payer: Self-pay | Admitting: *Deleted

## 2022-05-05 ENCOUNTER — Telehealth: Payer: BC Managed Care – PPO

## 2022-05-05 NOTE — Telephone Encounter (Signed)
Pt agreeable to tel pre op appt as well, she would like to keep her appt with Dr.Pemberton 05/25/22 as well. I said that is fine. Med rec and consent are done.  Tele appt 05/06/22 @ 10:20.

## 2022-05-05 NOTE — Telephone Encounter (Signed)
Pt agreeable to tel pre op appt as well, she would like to keep her appt with Dr.Pemberton 05/25/22 as well. I said that is fine. Med rec and consent are done.  Tele appt 05/06/22 @ 10:20.     Patient Consent for Virtual Visit        [name] has provided verbal consent on 05/05/2022 for a virtual visit (video or telephone).   CONSENT FOR VIRTUAL VISIT FOR:  [name]  By participating in this virtual visit I agree to the following:  I hereby voluntarily request, consent and authorize [facility] and its employed or contracted physicians, physician assistants, nurse practitioners or other licensed health care professionals (the Practitioner), to provide me with telemedicine health care services (the "Services") as deemed necessary by the treating Practitioner. I acknowledge and consent to receive the Services by the Practitioner via telemedicine. I understand that the telemedicine visit will involve communicating with the Practitioner through live audiovisual communication technology and the disclosure of certain medical information by electronic transmission. I acknowledge that I have been given the opportunity to request an in-person assessment or other available alternative prior to the telemedicine visit and am voluntarily participating in the telemedicine visit.  I understand that I have the right to withhold or withdraw my consent to the use of telemedicine in the course of my care at any time, without affecting my right to future care or treatment, and that the Practitioner or I may terminate the telemedicine visit at any time. I understand that I have the right to inspect all information obtained and/or recorded in the course of the telemedicine visit and may receive copies of available information for a reasonable fee.  I understand that some of the potential risks of receiving the Services via telemedicine include:  Delay or interruption in medical evaluation due to  technological equipment failure or disruption; Information transmitted may not be sufficient (e.g. poor resolution of images) to allow for appropriate medical decision making by the Practitioner; and/or  In rare instances, security protocols could fail, causing a breach of personal health information.  Furthermore, I acknowledge that it is my responsibility to provide information about my medical history, conditions and care that is complete and accurate to the best of my ability. I acknowledge that Practitioner's advice, recommendations, and/or decision may be based on factors not within their control, such as incomplete or inaccurate data provided by me or distortions of diagnostic images or specimens that may result from electronic transmissions. I understand that the practice of medicine is not an exact science and that Practitioner makes no warranties or guarantees regarding treatment outcomes. I acknowledge that a copy of this consent can be made available to me via my patient portal ([facility]), or I can request a printed copy by calling the office of [facility].    I understand that my insurance will be billed for this visit.   I have read or had this consent read to me. I understand the contents of this consent, which adequately explains the benefits and risks of the Services being provided via telemedicine.  I have been provided ample opportunity to ask questions regarding this consent and the Services and have had my questions answered to my satisfaction. I give my informed consent for the services to be provided through the use of telemedicine in my medical care

## 2022-05-05 NOTE — Telephone Encounter (Signed)
   Pre-operative Risk Assessment    Patient Name: [name]  DOB: [birth year] MRN: [id number]      Request for Surgical Clearance    Procedure:   LEFT THUMB CARPOMETACARPAL ARTHROPLASTY AND TENDON TRANSFER AND REPAIR  Date of Surgery:  Clearance 05/12/22                                 Surgeon:  DR. FRED [facility] Surgeon's Group or Practice Name:  [facility] Phone number:  [phone] Fax number:  [phone] ATTN:MEGAN DAVIS   Type of Clearance Requested:   - Medical ; ASA    Type of Anesthesia:   CHOICE   Additional requests/questions:    Jiles Prows   05/05/2022, 11:06 AM

## 2022-05-05 NOTE — Telephone Encounter (Signed)
   Name: [name] TORRICO  DOB: 1960-09-27  MRN: [id number]  Primary Cardiologist: Freada Bergeron, MD   Preoperative team, please contact this patient and set up a phone call appointment for further preoperative risk assessment. Please obtain consent and complete medication review. Thank you for your help.Surgery is scheduled for 05/12/2022. She is due for her a 6 month follow-up and has an appointment scheduled with Dr. Johney Frame on 05/25/2022. If patient does not wish to delay her surgery, please make sure she understands that she will require a tele visit IN ADDITION to her scheduled in person visit.   I confirm that guidance regarding antiplatelet and oral anticoagulation therapy has been completed and, if necessary, noted below.  Per office protocol, if patient is without any new symptoms or concerns at the time of their virtual visit, she may hold Aspirin for 5-7 days prior to procedure. Please resume Aspirin as soon as possible postprocedure, at the discretion of the surgeon.     Lenna Sciara, NP 05/05/2022, 11:17 AM [facility]

## 2022-05-06 ENCOUNTER — Encounter: Payer: Self-pay | Admitting: Nurse Practitioner

## 2022-05-06 ENCOUNTER — Ambulatory Visit: Payer: BC Managed Care – PPO | Attending: Cardiovascular Disease | Admitting: Nurse Practitioner

## 2022-05-06 DIAGNOSIS — Z01810 Encounter for preprocedural cardiovascular examination: Secondary | ICD-10-CM

## 2022-05-06 NOTE — Progress Notes (Signed)
Virtual Visit via Telephone Note   Because of [name]'s co-morbid illnesses, she is at least at moderate risk for complications without adequate follow up.  This format is felt to be most appropriate for this patient at this time.  The patient did not have access to video technology/had technical difficulties with video requiring transitioning to audio format only (telephone).  All issues noted in this document were discussed and addressed.  No physical exam could be performed with this format.  Please refer to the patient's chart for her consent to telehealth for [facility].  Evaluation Performed:  Preoperative cardiovascular risk assessment _____________   Date:  05/06/2022   Patient ID:  [name], DOB Jan 12, 1961, MRN [id number] Patient Location:  Home Provider location:   Office  Primary Care Provider:  Hoyt Koch, MD Primary Cardiologist:  Freada Bergeron, MD  Chief Complaint / Patient Profile   61 y.o. y/o female with a h/o CAD s/p DES-pLAD in 03/2017, palpitations, hypertension, hyperlipidemia, type 2 diabetes and Crohn's disease who is pending L thumb carpometacarpal arthoplasty and tendon transfer and repair on 05/12/2022 with Dr. Iran Planas of [facility] and presents today for telephonic preoperative cardiovascular risk assessment.  History of Present Illness    [name] is a 61 y.o. female who presents via audio/video conferencing for a telehealth visit today.  Pt was last seen in cardiology clinic on 12/07/2021 by Dr. Johney Frame. At that time [name] was doing well.  The patient is now pending procedure as outlined above. Since her last visit, she has done well from a cardiac standpoint.   She denies chest pain, palpitations, dyspnea, pnd, orthopnea, n, v, dizziness, syncope, edema, weight gain, or early satiety. All other systems reviewed and are otherwise negative except as noted above.   Past Medical History    Past Medical  History:  Diagnosis Date   Allergic rhinitis due to pollen    Allergy    Anxiety state, unspecified    Arthritis    Bronchitis    hx   CAD (coronary artery disease)    s/p NSTEMI in 11/18 treated with a DES to the proximal LAD   Calculus of gallbladder without mention of cholecystitis or obstruction    Crohn disease ([place])    Depressive disorder, not elsewhere classified    Diabetes mellitus without complication ([place])    diet controlled   Esophageal reflux    Fibromyalgia    Grade I diastolic dysfunction    Noted on ECHO   Headache    History of nuclear stress test    ETT-Myoview 10/17: EF 55%, normal perfusion; Low Risk // Nuclear stress test 05/2018:  EF 63, normal perfusion; Low Risk   Myalgia and myositis, unspecified    NSTEMI (non-ST elevated myocardial infarction) ([place]) 04/15/2017   Other and unspecified hyperlipidemia    Palpitations    Pneumonia 13   hx   S/P angioplasty with stent 04/18/17 with DES to pLAD 04/19/2017   Seizures ([place])    1 seizure as a teenager    Unspecified essential hypertension    Vitamin B deficiency    Past Surgical History:  Procedure Laterality Date   BIOPSY  12/05/2018   Procedure: BIOPSY;  Surgeon: Mauri Pole, MD;  Location: [facility];  Service: Endoscopy;;   CHOLECYSTECTOMY N/A 09/19/2014   Procedure: LAPAROSCOPIC CHOLECYSTECTOMY WITH INTRAOPERATIVE CHOLANGIOGRAM;  Surgeon: Donnie Mesa, MD;  Location: [facility];  Service: General;  Laterality: N/A;  COLONOSCOPY WITH PROPOFOL N/A 12/05/2018   Procedure: COLONOSCOPY WITH PROPOFOL;  Surgeon: Mauri Pole, MD;  Location: [facility];  Service: Endoscopy;  Laterality: N/A;  chromoendoscopy   CORONARY STENT INTERVENTION N/A 04/18/2017   Procedure: CORONARY STENT INTERVENTION;  Surgeon: Martinique, Peter M, MD;  Location: [place] [facility];  Service: Cardiovascular;  Laterality: N/A;   ESOPHAGOGASTRODUODENOSCOPY     EXPLORATORY LAPAROTOMY     for endometriosis   INTRAVASCULAR  PRESSURE WIRE/FFR STUDY N/A 04/18/2017   Procedure: INTRAVASCULAR PRESSURE WIRE/FFR STUDY;  Surgeon: Martinique, Peter M, MD;  Location: [place] [facility];  Service: Cardiovascular;  Laterality: N/A;   LEFT HEART CATH AND CORONARY ANGIOGRAPHY N/A 04/18/2017   Procedure: LEFT HEART CATH AND CORONARY ANGIOGRAPHY;  Surgeon: Martinique, Peter M, MD;  Location: [place] [facility];  Service: Cardiovascular;  Laterality: N/A;   POLYPECTOMY  12/05/2018   Procedure: POLYPECTOMY;  Surgeon: Mauri Pole, MD;  Location: [facility];  Service: Endoscopy;;   SUBMUCOSAL LIFTING INJECTION  12/05/2018   Procedure: SUBMUCOSAL LIFTING INJECTION;  Surgeon: Mauri Pole, MD;  Location: [facility];  Service: Endoscopy;;   TONSILLECTOMY AND ADENOIDECTOMY     TUBAL LIGATION     BILATERAL    Allergies  Allergies  Allergen Reactions   Lipitor [Atorvastatin] Other (See Comments)    Elevated ALT   Remicade [Infliximab]     REACTION: SOB   Ticagrelor     Shortness of breath   Rosuvastatin Other (See Comments)    Pt reports causes muscle aches and leg pains and feet to swell    Home Medications    Prior to Admission medications   Medication Sig Start Date End Date Taking? Authorizing Provider  acetaminophen (TYLENOL) 650 MG CR tablet Take 650 mg by mouth every 8 (eight) hours as needed for pain.    [provider]  albuterol (PROVENTIL) (2.5 MG/3ML) 0.083% nebulizer solution Take 2.5 mg by nebulization every 4 (four) hours as needed for wheezing or shortness of breath.     [provider]  albuterol (VENTOLIN HFA) 108 (90 Base) MCG/ACT inhaler Inhale 1-2 puffs into the lungs every 6 (six) hours as needed for wheezing or shortness of breath.    [provider]  aspirin EC 81 MG tablet Take 81 mg by mouth daily. Swallow whole.    [provider]  Blood Glucose Monitoring Suppl (FREESTYLE LITE) w/Device KIT Use to check blood sugar up to 4 times a day as directed 03/10/21    Hoyt Koch, MD  budesonide (ENTOCORT EC) 3 MG 24 hr capsule Take 3 capsules (9 mg total) by mouth daily. 07/08/21   Mauri Pole, MD  busPIRone (BUSPAR) 10 MG tablet Take 1 tablet (10 mg total) by mouth 2 (two) times daily. 12/05/20     colestipol (COLESTID) 1 g tablet Take 1 tablet by mouth daily. 11/06/21   Mauri Pole, MD  Continuous Blood Gluc Sensor (FREESTYLE LIBRE 14 DAY SENSOR) MISC Use to monitor sugars 04/03/21   Hoyt Koch, MD  Continuous Blood Gluc Sensor ([place]) MISC Use for blood sugar monitoring 04/03/21   Hoyt Koch, MD  cyanocobalamin (,VITAMIN B-12,) 1000 MCG/ML injection 1 ML  B12 injection every 2 weeks for 3 months then decrease to 1 B12 injection to once a month 12/16/20   Nandigam, Venia Minks, MD  dicyclomine (BENTYL) 20 MG tablet Take 1 tablet (20 mg total) by mouth every 6 (six) hours. 06/02/21  Gatha Mayer, MD  DULoxetine (CYMBALTA) 60 MG capsule Take 1 capsule (60 mg total) by mouth daily. 02/03/22   Hoyt Koch, MD  empagliflozin (JARDIANCE) 25 MG TABS tablet Take 1 tablet (25 mg total) by mouth daily before breakfast. 03/02/22   Hoyt Koch, MD  enalapril (VASOTEC) 10 MG tablet Take 1 tablet (10 mg total) by mouth daily. 12/07/21   Freada Bergeron, MD  Evolocumab (REPATHA SURECLICK) 956 MG/ML SOAJ Inject 1 Pen into the skin every 14 (fourteen) days. 12/24/21   Freada Bergeron, MD  fenofibrate (TRICOR) 145 MG tablet TAKE ONE TABLET BY MOUTH ONCE DAILY 12/07/21   Freada Bergeron, MD  fluorouracil (EFUDEX) 5 % cream     [provider]  furosemide (LASIX) 40 MG tablet Take 1 tablet (40 mg total) by mouth daily as needed for fluid or edema. 11/27/19   Dorothy Spark, MD  glimepiride (AMARYL) 2 MG tablet Take 1 tablet (2 mg total) by mouth daily before breakfast. 01/26/22   Hoyt Koch, MD  glucose blood (TRUE METRIX BLOOD GLUCOSE TEST) test strip Use to check  blood sugar up to 4 times a day as directed. 09/23/21   Hoyt Koch, MD  insulin glargine-yfgn (SEMGLEE, YFGN,) 100 UNIT/ML Pen Inject 48-55 Units into the skin daily. 02/02/22   Hoyt Koch, MD  Insulin Pen Needle (PEN NEEDLES 3/16") 31G X 5 MM MISC Use daily 03/12/21   Hoyt Koch, MD  Lancets (FREESTYLE) lancets Use to check blood sugar up to 4 times a day as directed 03/10/21   Hoyt Koch, MD  levocetirizine (XYZAL) 5 MG tablet Take 1 tablet  by mouth every evening. 04/27/22   Hoyt Koch, MD  metFORMIN (GLUCOPHAGE-XR) 500 MG 24 hr tablet Take 2 tablets by mouth daily. 12/16/21   Hoyt Koch, MD  metoprolol succinate (TOPROL-XL) 25 MG 24 hr tablet Take 1 & 1/2 tablets (37.5 mg total) by mouth daily. 12/07/21   Freada Bergeron, MD  montelukast (SINGULAIR) 10 MG tablet Take 1 tablet by mouth every evening. 05/01/21   Hoyt Koch, MD  Multiple Vitamins-Minerals (MULTIVITAMIN WITH MINERALS) tablet Take 1 tablet by mouth daily.    [provider]  nitroGLYCERIN (NITROSTAT) 0.4 MG SL tablet Place 1 tablet (0.4 mg total) under the tongue every 5 (five) minutes as needed for chest pain.  Call 911 if no relief after 1st dose. 02/25/21   Loel Dubonnet, NP  ondansetron (ZOFRAN ODT) 4 MG disintegrating tablet Take 1 tablet (4 mg total) by mouth every 4 (four) hours as needed for nausea or vomiting. 10/18/20   Charlesetta Shanks, MD  pantoprazole (PROTONIX) 40 MG tablet Take 1 tablet (40 mg total) by mouth daily. 11/06/21   Mauri Pole, MD  Probiotic Product (PROBIOTIC DAILY PO) Take 1 capsule by mouth daily.    [provider]  promethazine-dextromethorphan (PROMETHAZINE-DM) 6.25-15 MG/5ML syrup Take 5 mLs by mouth 4 (four) times daily as needed. 03/03/22   Hoyt Koch, MD  Risankizumab-rzaa [facility]) 360 MG/2.4ML SOCT Week 12 begin 360 mg SQ injection by cartridge and repeat every 8 weeks 02/08/22   Mauri Pole, MD  sodium chloride (OCEAN) 0.65 % SOLN nasal spray Place 1 spray into both nostrils daily as needed for congestion.    [provider]  sucralfate (CARAFATE) 1 g tablet Take 1 tablet (1 g total) by mouth 3 (three) times daily before meals. 06/24/21  Mauri Pole, MD  VASCEPA 1 g capsule Take 2 capsules by mouth 2 times daily. 12/24/21   Freada Bergeron, MD  Vitamin D3 (VITAMIN D) 25 MCG tablet Take 1,000 Units by mouth at bedtime.    [provider]  EPIPEN 2-PAK 0.3 MG/0.3ML SOAJ injection Inject 0.3 mg into the muscle as needed for anaphylaxis. Reported on 06/27/2015 10/09/14   [provider]  insulin glargine (LANTUS) 100 UNIT/ML injection Inject 0.48 mLs (48 Units total) into the skin at bedtime. 12/15/20 12/22/20  Hoyt Koch, MD    Physical Exam    Vital Signs:  [name] does not have vital signs available for review today.  Given telephonic nature of communication, physical exam is limited. AAOx3. NAD. Normal affect.  Speech and respirations are unlabored.  Accessory Clinical Findings    None  Assessment & Plan    1.  Preoperative Cardiovascular Risk Assessment:  According to the Revised Cardiac Risk Index (RCRI), her Perioperative Risk of Major Cardiac Event is (%): 6.6. Her Functional Capacity in METs is: 7.01 according to the [place] Activity Status Index (DASI).Therefore, based on ACC/AHA guidelines, patient would be at acceptable risk for the planned procedure without further cardiovascular  testing.   The patient was advised that if she develops new symptoms prior to surgery to contact our office to arrange for a follow-up visit, and she verbalized understanding.   Per office protocol, she may hold Aspirin for 5-7 days prior to procedure. Please resume Aspirin as soon as possible postprocedure, at the discretion of the surgeon.     A copy of this note will be routed to requesting surgeon.  Time:   Today, I have  spent 4 minutes with the patient with telehealth technology discussing medical history, symptoms, and management plan.     Lenna Sciara, NP  05/06/2022, 10:07 AM

## 2022-05-08 ENCOUNTER — Other Ambulatory Visit: Payer: Self-pay | Admitting: Internal Medicine

## 2022-05-08 ENCOUNTER — Other Ambulatory Visit (HOSPITAL_COMMUNITY): Payer: Self-pay

## 2022-05-09 ENCOUNTER — Emergency Department (HOSPITAL_COMMUNITY): Payer: BC Managed Care – PPO

## 2022-05-09 ENCOUNTER — Emergency Department (HOSPITAL_COMMUNITY)
Admission: EM | Admit: 2022-05-09 | Discharge: 2022-05-09 | Discharge status: Against Medical Advice | Payer: BC Managed Care – PPO | Attending: Emergency Medicine | Admitting: Emergency Medicine

## 2022-05-09 DIAGNOSIS — R072 Precordial pain: Secondary | ICD-10-CM | POA: Diagnosis not present

## 2022-05-09 DIAGNOSIS — Z5321 Procedure and treatment not carried out due to patient leaving prior to being seen by health care provider: Secondary | ICD-10-CM | POA: Diagnosis not present

## 2022-05-09 DIAGNOSIS — R079 Chest pain, unspecified: Secondary | ICD-10-CM | POA: Diagnosis not present

## 2022-05-09 DIAGNOSIS — I200 Unstable angina: Secondary | ICD-10-CM | POA: Diagnosis not present

## 2022-05-09 DIAGNOSIS — R0789 Other chest pain: Secondary | ICD-10-CM | POA: Diagnosis not present

## 2022-05-09 LAB — BASIC METABOLIC PANEL
Anion gap: 10 (ref 5–15)
BUN: 20 mg/dL (ref 8–23)
CO2: 24 mmol/L (ref 22–32)
Calcium: 9.5 mg/dL (ref 8.9–10.3)
Chloride: 104 mmol/L (ref 98–111)
Creatinine, Ser: 1.02 mg/dL — ABNORMAL HIGH (ref 0.44–1.00)
GFR, Estimated: >60 mL/min (ref >60)
Glucose, Bld: 253 mg/dL — ABNORMAL HIGH (ref 70–99)
Potassium: 3.9 mmol/L (ref 3.5–5.1)
Sodium: 138 mmol/L (ref 135–145)

## 2022-05-09 LAB — CBC
HCT: 43.1 % (ref 36.0–46.0)
Hemoglobin: 14.1 g/dL (ref 12.0–15.0)
MCH: 30.1 pg (ref 26.0–34.0)
MCHC: 32.7 g/dL (ref 30.0–36.0)
MCV: 91.9 fL (ref 80.0–100.0)
Platelets: 309 10*3/uL (ref 150–400)
RBC: 4.69 MIL/uL (ref 3.87–5.11)
RDW: 13.3 % (ref 11.5–15.5)
WBC: 10.3 10*3/uL (ref 4.0–10.5)
nRBC: 0 % (ref 0.0–0.2)

## 2022-05-09 LAB — TROPONIN I (HIGH SENSITIVITY): Troponin I (High Sensitivity): 5 ng/L (ref <18)

## 2022-05-09 NOTE — ED Triage Notes (Signed)
Patient here with complaint of left chest pain radiating in to her left neck, left arm, and left side of back that started this morning after waking. Patient denies nausea, denies dizziness. Patient is alert, oriented, and in no apparent distress at this time.

## 2022-05-09 NOTE — ED Provider Triage Note (Signed)
Emergency Medicine Provider Triage Evaluation Note  [name] , a 61 y.o. female  was evaluated in triage.  Pt complains of chest pain which began worsening this morning.  Patient states the pain was initially substernal and felt like it was burning.  She states that over the course of the day the pain is radiated into the left shoulder and arm region.  She has a history of previous MI approximately 5 years ago which required stents.  Patient states the symptoms feel similar to the previous heart attack.  Patient also endorses mild shortness of breath.  She denies nausea, vomiting, abdominal pain  Review of Systems  Positive: As above Negative: As above  Physical Exam  BP (!) 156/73 (BP Location: Right Arm)   Pulse 99   Temp 98.5 F (36.9 C)   Resp 16   LMP  (LMP Unknown)   SpO2 96%  Gen:   Awake, no distress   Resp:  Normal effort  MSK:   Moves extremities without difficulty  Other:    Medical Decision Making  Medically screening exam initiated at 5:55 PM.  Appropriate orders placed.  [name] was informed that the remainder of the evaluation will be completed by another provider, this initial triage assessment does not replace that evaluation, and the importance of remaining in the ED until their evaluation is complete.     Dorothyann Peng, PA-C 05/09/22 1759

## 2022-05-09 NOTE — ED Notes (Signed)
Pt stated she would call her doctor in the morning, left AMA

## 2022-05-10 ENCOUNTER — Other Ambulatory Visit (HOSPITAL_COMMUNITY): Payer: Self-pay

## 2022-05-10 ENCOUNTER — Telehealth: Payer: Self-pay | Admitting: Cardiology

## 2022-05-10 MED ORDER — "PEN NEEDLES 3/16"" 31G X 5 MM MISC"
11 refills | Status: DC
  Filled 2022-05-10: qty 100, 30d supply, fill #0
  Filled 2022-08-23 (×2): qty 100, 30d supply, fill #1
  Filled 2022-11-02 (×3): qty 100, 30d supply, fill #2

## 2022-05-10 NOTE — Telephone Encounter (Signed)
Pt just received cardiac clearance from our office last week, for upcoming arm surgery she will be having done before the new year.   Pt states she went to the ER yesterday for chest pain.  She states she stayed for about 11 hrs, had an EKG done and some labs, but had to leave because her husband had to go to work.  Pt is calling to ask if she is still considered "cleared from a cardiac perspective for upcoming arm surgery" being she went to the ER yesterday for chest pain.  Informed the pt that unfortunately she more than likely wouldn't be considered "clear" from a heart perspective at this time, given recurrence of chest pain and ER visit yesterday, and being she didn't complete her cardiac work-up and testing at the ER yesterday.  Advised the pt that the safest thing for her to do at this point to ensure that she can be safely cleared from a heart perspective for upcoming ortho sx, is to come into the office to be seen sometime this week for that.    Scheduled the pt to come into the office to see Ambrose Pancoast NP for tomorrow 12/12 at [phone].  Advised her to arrive 15 mins prior to this appt, and make sure she is compliant with keeping this appt, so there is no further delay in her upcoming surgery.  Pt verbalized understanding and agrees with this plan.   Will make the pts Primary Cardiologist Dr. Johney Frame, aware of this plan, as well as our pre-op clearance team.

## 2022-05-10 NOTE — Telephone Encounter (Signed)
Patient had to go to the ER on last night. Patient would like for the dr to take a look at her results, to make sure its okay that she still can have her surgery. Please advise

## 2022-05-10 NOTE — Progress Notes (Unsigned)
Office Visit    Patient Name: [name] Date of Encounter: 05/11/2022  Primary Care Provider:  Hoyt Koch, MD Primary Cardiologist:  Freada Bergeron, MD Primary Electrophysiologist: None  Chief Complaint    [name] is a 61 y.o. female with PMH of CAD s/p NSTEMI 2018 with DES to pLAD, HLD, HTN, DM type II, Crohn's disease who presents today for preoperative clearance for left thumb arthroplasty and tendon transfer repair.  Past Medical History    Past Medical History:  Diagnosis Date   Allergic rhinitis due to pollen    Allergy    Anxiety state, unspecified    Arthritis    Bronchitis    hx   CAD (coronary artery disease)    s/p NSTEMI in 11/18 treated with a DES to the proximal LAD   Calculus of gallbladder without mention of cholecystitis or obstruction    Crohn disease ([place])    Depressive disorder, not elsewhere classified    Diabetes mellitus without complication ([place])    diet controlled   Esophageal reflux    Fibromyalgia    Grade I diastolic dysfunction    Noted on ECHO   Headache    History of nuclear stress test    ETT-Myoview 10/17: EF 55%, normal perfusion; Low Risk // Nuclear stress test 05/2018:  EF 63, normal perfusion; Low Risk   Myalgia and myositis, unspecified    NSTEMI (non-ST elevated myocardial infarction) ([place]) 04/15/2017   Other and unspecified hyperlipidemia    Palpitations    Pneumonia 13   hx   S/P angioplasty with stent 04/18/17 with DES to pLAD 04/19/2017   Seizures ([place])    1 seizure as a teenager    Unspecified essential hypertension    Vitamin B deficiency    Past Surgical History:  Procedure Laterality Date   BIOPSY  12/05/2018   Procedure: BIOPSY;  Surgeon: Mauri Pole, MD;  Location: [facility];  Service: Endoscopy;;   CHOLECYSTECTOMY N/A 09/19/2014   Procedure: LAPAROSCOPIC CHOLECYSTECTOMY WITH INTRAOPERATIVE CHOLANGIOGRAM;  Surgeon: Donnie Mesa, MD;  Location: [facility];  Service: General;   Laterality: N/A;   COLONOSCOPY WITH PROPOFOL N/A 12/05/2018   Procedure: COLONOSCOPY WITH PROPOFOL;  Surgeon: Mauri Pole, MD;  Location: [facility];  Service: Endoscopy;  Laterality: N/A;  chromoendoscopy   CORONARY STENT INTERVENTION N/A 04/18/2017   Procedure: CORONARY STENT INTERVENTION;  Surgeon: Martinique, Peter M, MD;  Location: [facility];  Service: Cardiovascular;  Laterality: N/A;   ESOPHAGOGASTRODUODENOSCOPY     EXPLORATORY LAPAROTOMY     for endometriosis   INTRAVASCULAR PRESSURE WIRE/FFR STUDY N/A 04/18/2017   Procedure: INTRAVASCULAR PRESSURE WIRE/FFR STUDY;  Surgeon: Martinique, Peter M, MD;  Location: [facility];  Service: Cardiovascular;  Laterality: N/A;   LEFT HEART CATH AND CORONARY ANGIOGRAPHY N/A 04/18/2017   Procedure: LEFT HEART CATH AND CORONARY ANGIOGRAPHY;  Surgeon: Martinique, Peter M, MD;  Location: [facility];  Service: Cardiovascular;  Laterality: N/A;   POLYPECTOMY  12/05/2018   Procedure: POLYPECTOMY;  Surgeon: Mauri Pole, MD;  Location: [facility];  Service: Endoscopy;;   SUBMUCOSAL LIFTING INJECTION  12/05/2018   Procedure: SUBMUCOSAL LIFTING INJECTION;  Surgeon: Mauri Pole, MD;  Location: [facility];  Service: Endoscopy;;   TONSILLECTOMY AND ADENOIDECTOMY     TUBAL LIGATION     BILATERAL    Allergies  Allergies  Allergen Reactions   Lipitor [Atorvastatin] Other (See Comments)    Elevated ALT  Remicade [Infliximab]     REACTION: SOB   Ticagrelor     Shortness of breath   Rosuvastatin Other (See Comments)    Pt reports causes muscle aches and leg pains and feet to swell    History of Present Illness    [name]  is a 61 year old female with the above mention past medical history who presents today for surgical clearance.  [name] was initially seen by Dr. Aundra Dubin in 2011 for complaint of chest pain that was moderate and substernal in nature.  She also endorsed shortness of breath and bouts of irregular  heartbeats.  She underwent ETT on 07/2009 that showed no evidence of ischemia and Holter monitor was worn that showed occasional PVCs.  She underwent coronary CT in 2012 for continued chest pain that revealed calcium score put her in a high risk category (87th percentile). No recent chest pain.  It was believed that her PVCs may be attributing to her chest discomfort with dyspnea.  She was seen in 2017 with complaint of dyspnea on exertion with palpitations.  She had a repeat ETT that showed low risk without evidence of ischemia.    She was seen in the ED on 2018 by Dr. Meda Coffee for complaint of chest pain and worsening dyspnea on exertion.  EKG was performed and showed sinus with T wave inversion and nonspecific changes anterior laterally.  She underwent left heart cath that revealed 65% lesion and proximal LAD that was treated with DES x 1 and EF of 50-55%.  2D echo was completed for confirmation of EF that revealed 55-60% with mild hypokinesis and apical lateral septal myocardium with grade 1 DD and mildly elevated PA pressures of 35 mmHg.  She was seen in follow-up 04/2018 for evaluation of chest pain and was placed on isosorbide and stress test was completed that showed no ischemia and was low risk.  She continued to have complaint of chest discomfort when seen in follow-up 2021 and underwent additional Lexiscan nuclear stress test that was low risk without ischemia or scar and showed normal perfusion.  She was seen in the ED 02/13/2021 with complaint of chest pain below breastbone that radiated to her back.  EKG revealed no acute ST or T wave changes and Toprol was increased to 37.5 mg with no indication for ischemic evaluation at that time.  She was last seen by Dr. Johney Frame on 11/2021 and was doing well overall and was also seen by televisit 05/06/2022 with no changes from previous visit.  She was seen in the ED on 05/09/2022 with complaint of chest pain and was unable to complete evaluation and left AMA at  that time.  [name] presents today for ED follow-up of chest discomfort and surgical clearance.  She reports today that since last being seen in the emergency room she is not experienced any chest pain episodes.  She states that it may have been related to reflux possibly.  She was concerned that it may be chest related because of the pain penetrating to her back.  I advised her that if pain is not resolved with positional change or increases in intensity to follow-up in the ED again.  Her blood pressure today was well-controlled at 122/64 and heart rate was 73 bpm.  She is scheduled to undergo wrist surgery tomorrow and will be cleared for that procedure following our visit today.  She had no other concerns at this time and all questions were answered to her  satisfaction. Patient denies chest pain, palpitations, dyspnea, PND, orthopnea, nausea, vomiting, dizziness, syncope, edema, weight gain, or early satiety.  Home Medications    Current Outpatient Medications  Medication Sig Dispense Refill   acetaminophen (TYLENOL) 650 MG CR tablet Take 650 mg by mouth every 8 (eight) hours as needed for pain.     albuterol (PROVENTIL) (2.5 MG/3ML) 0.083% nebulizer solution Take 2.5 mg by nebulization every 4 (four) hours as needed for wheezing or shortness of breath.      albuterol (VENTOLIN HFA) 108 (90 Base) MCG/ACT inhaler Inhale 1-2 puffs into the lungs every 6 (six) hours as needed for wheezing or shortness of breath.     aspirin EC 81 MG tablet Take 81 mg by mouth daily. Swallow whole.     Blood Glucose Monitoring Suppl (FREESTYLE LITE) w/Device KIT Use to check blood sugar up to 4 times a day as directed 1 kit 0   budesonide (ENTOCORT EC) 3 MG 24 hr capsule Take 3 capsules (9 mg total) by mouth daily. 270 capsule 0   busPIRone (BUSPAR) 10 MG tablet Take 1 tablet (10 mg total) by mouth 2 (two) times daily. 180 tablet 0   colestipol (COLESTID) 1 g tablet Take 1 tablet by mouth daily. 90 tablet 1    Continuous Blood Gluc Sensor (FREESTYLE LIBRE 14 DAY SENSOR) MISC Use to monitor sugars 2 each 11   Continuous Blood Gluc Sensor (FREESTYLE LIBRE SENSOR SYSTEM) MISC Use for blood sugar monitoring 1 each 0   cyanocobalamin (,VITAMIN B-12,) 1000 MCG/ML injection 1 ML  B12 injection every 2 weeks for 3 months then decrease to 1 B12 injection to once a month 1 mL 12   dicyclomine (BENTYL) 20 MG tablet Take 1 tablet (20 mg total) by mouth every 6 (six) hours. 60 tablet 0   DULoxetine (CYMBALTA) 60 MG capsule Take 1 capsule (60 mg total) by mouth daily. 90 capsule 1   empagliflozin (JARDIANCE) 25 MG TABS tablet Take 1 tablet (25 mg total) by mouth daily before breakfast. 30 tablet 3   enalapril (VASOTEC) 10 MG tablet Take 1 tablet (10 mg total) by mouth daily. 90 tablet 3   Evolocumab (REPATHA SURECLICK) 761 MG/ML SOAJ Inject 1 Pen into the skin every 14 (fourteen) days. 2 mL 11   fenofibrate (TRICOR) 145 MG tablet TAKE ONE TABLET BY MOUTH ONCE DAILY 90 tablet 3   furosemide (LASIX) 40 MG tablet Take 1 tablet (40 mg total) by mouth daily as needed for fluid or edema. 30 tablet 2   glimepiride (AMARYL) 2 MG tablet Take 1 tablet (2 mg total) by mouth daily before breakfast. 30 tablet 3   glucose blood (TRUE METRIX BLOOD GLUCOSE TEST) test strip Use to check blood sugar up to 4 times a day as directed. 100 each 3   insulin glargine-yfgn (SEMGLEE, YFGN,) 100 UNIT/ML Pen Inject 48-55 Units into the skin daily. 30 mL 3   Insulin Pen Needle (PEN NEEDLES 3/16") 31G X 5 MM MISC Use daily 50 each 11   Lancets (FREESTYLE) lancets Use to check blood sugar up to 4 times a day as directed 100 each 0   levocetirizine (XYZAL) 5 MG tablet Take 1 tablet  by mouth every evening. 90 tablet 0   metFORMIN (GLUCOPHAGE-XR) 500 MG 24 hr tablet Take 2 tablets by mouth daily. 60 tablet 5   metoprolol succinate (TOPROL-XL) 25 MG 24 hr tablet Take 1 & 1/2 tablets (37.5 mg total) by mouth daily. 135 tablet 3  montelukast  (SINGULAIR) 10 MG tablet Take 1 tablet by mouth every evening. 90 tablet 3   Multiple Vitamins-Minerals (MULTIVITAMIN WITH MINERALS) tablet Take 1 tablet by mouth daily.     nitroGLYCERIN (NITROSTAT) 0.4 MG SL tablet Place 1 tablet (0.4 mg total) under the tongue every 5 (five) minutes as needed for chest pain.  Call 911 if no relief after 1st dose. 25 tablet 5   ondansetron (ZOFRAN ODT) 4 MG disintegrating tablet Take 1 tablet (4 mg total) by mouth every 4 (four) hours as needed for nausea or vomiting. 20 tablet 0   pantoprazole (PROTONIX) 40 MG tablet Take 1 tablet (40 mg total) by mouth daily. 240 tablet 0   Probiotic Product (PROBIOTIC DAILY PO) Take 1 capsule by mouth daily.     promethazine-dextromethorphan (PROMETHAZINE-DM) 6.25-15 MG/5ML syrup Take 5 mLs by mouth 4 (four) times daily as needed. 118 mL 0   Risankizumab-rzaa (SKYRIZI) 360 MG/2.4ML SOCT Week 12 begin 360 mg SQ injection by cartridge and repeat every 8 weeks 2.4 mL 7   sodium chloride (OCEAN) 0.65 % SOLN nasal spray Place 1 spray into both nostrils daily as needed for congestion.     VASCEPA 1 g capsule Take 2 capsules by mouth 2 times daily. 360 capsule 3   Vitamin D3 (VITAMIN D) 25 MCG tablet Take 1,000 Units by mouth at bedtime.     No current facility-administered medications for this visit.     Review of Systems  Please see the history of present illness.     All other systems reviewed and are otherwise negative except as noted above.  Physical Exam    Wt Readings from Last 3 Encounters:  05/11/22 141 lb (64 kg)  03/02/22 141 lb (64 kg)  12/22/21 140 lb (63.5 kg)   VS: Vitals:   05/11/22 0904  BP: 122/64  Pulse: 73  SpO2: 95%  ,Body mass index is 24.98 kg/m.  Constitutional:      Appearance: Healthy appearance. Not in distress.  Neck:     Vascular: JVD normal.  Pulmonary:     Effort: Pulmonary effort is normal.     Breath sounds: No wheezing. No rales. Diminished in the bases Cardiovascular:      Normal rate. Regular rhythm. Normal S1. Normal S2.      Murmurs: There is no murmur.  Edema:    Peripheral edema absent.  Abdominal:     Palpations: Abdomen is soft non tender. There is no hepatomegaly.  Skin:    General: Skin is warm and dry.  Neurological:     General: No focal deficit present.     Mental Status: Alert and oriented to person, place and time.     Cranial Nerves: Cranial nerves are intact.  EKG/LABS/Other Studies Reviewed    ECG personally reviewed by me today -sinus rhythm with a rate of 73 bpm with no acute changes consistent with previous EKG.   Lab Results  Component Value Date   WBC 10.3 05/09/2022   HGB 14.1 05/09/2022   HCT 43.1 05/09/2022   MCV 91.9 05/09/2022   PLT 309 05/09/2022   Lab Results  Component Value Date   CREATININE 1.02 (H) 05/09/2022   BUN 20 05/09/2022   NA 138 05/09/2022   K 3.9 05/09/2022   CL 104 05/09/2022   CO2 24 05/09/2022   Lab Results  Component Value Date   ALT 63 (H) 12/22/2021   AST 37 12/22/2021   ALKPHOS 125 (H) 12/22/2021  BILITOT 0.3 12/22/2021   Lab Results  Component Value Date   CHOL 204 (H) 08/14/2021   HDL 37.40 (L) 08/14/2021   LDLCALC 7 07/14/2020   LDLDIRECT 102.0 08/14/2021   TRIG (H) 08/14/2021    484.0 Triglyceride is over 400; calculations on Lipids are invalid.   CHOLHDL 5 08/14/2021    Lab Results  Component Value Date   HGBA1C 9.5 (A) 03/02/2022    Assessment & Plan    The patient affirms she has been doing well without any new cardiac symptoms since her ER visit for chest discomfort on 05/09/2022.  She reports no recurrence of chest discomfort since visit.  EKG completed today reassuring with no acute changes or findings.  They are able to achieve 5 METS without cardiac limitations. Therefore, based on ACC/AHA guidelines, the patient would be at acceptable risk for the planned procedure without further cardiovascular testing. The patient was advised that if she develops new symptoms  prior to surgery to contact our office to arrange for a follow-up visit, and she verbalized understanding.   Ms. Vandoren perioperative risk of a major cardiac event is 6.6% according to the Revised Cardiac Risk Index (RCRI).  Therefore, she is at high risk for perioperative complications.   Her functional capacity is good at 5.62 METs according to the Duke Activity Status Index (DASI).  Recommendations: According to ACC/AHA guidelines, no further cardiovascular testing needed.  The patient may proceed to surgery at acceptable risk.   Antiplatelet and/or Anticoagulation Recommendations: Aspirin can be held for 7 days prior to her surgery.  Please resume Aspirin post operatively when it is felt to be safe from a bleeding standpoint.      2.  Coronary artery disease: -s/p NSTEMI 2019 with DES x 1 to proximal LAD and most recent ischemic evaluation completed 07/2020 by Myoview that showed no evidence of ischemia or infarct -Patient was recently seen in the ED with complaint of chest pain and today reports no chest pain since previous visit in the emergency room. -She reports that pain may be related to indigestion and she was encouraged to continue Protonix as prescribed by PCP. -Continue GDMT with ASA 81 mg, Repatha, Jardiance, fenofibrate, Toprol XL 37.5 mg, Vascepa  3.  Essential hypertension: -Patient's blood pressure today was well-controlled at 122/64 -Continue Toprol-XL and Vasotec  4.  Hyperlipidemia -Continue Repatha, Vascepa and fenofibrate Disposition: Follow-up with Freada Bergeron, MD or APP in 12 months   Medication Adjustments/Labs and Tests Ordered: Current medicines are reviewed at length with the patient today.  Concerns regarding medicines are outlined above.   Signed, Mable Fill, Marissa Nestle, NP 05/11/2022, 9:36 AM [facility]  Note:  This document was prepared using Dragon voice recognition software and may include unintentional dictation  errors.

## 2022-05-10 NOTE — Telephone Encounter (Signed)
Agree. Pt needs in person evaluation for ED visit for chest pain. Will fwd to provider seeing her tomorrow and remove from preop pool.  Richardson Dopp, PA-C    05/10/2022 9:09 AM

## 2022-05-11 ENCOUNTER — Encounter: Payer: Self-pay | Admitting: Nurse Practitioner

## 2022-05-11 ENCOUNTER — Ambulatory Visit: Payer: BC Managed Care – PPO | Attending: Nurse Practitioner | Admitting: Nurse Practitioner

## 2022-05-11 VITALS — BP 122/64 | HR 73 | Ht 63.0 in | Wt 141.0 lb

## 2022-05-11 DIAGNOSIS — I10 Essential (primary) hypertension: Secondary | ICD-10-CM | POA: Diagnosis not present

## 2022-05-11 DIAGNOSIS — I25118 Atherosclerotic heart disease of native coronary artery with other forms of angina pectoris: Secondary | ICD-10-CM

## 2022-05-11 DIAGNOSIS — Z01810 Encounter for preprocedural cardiovascular examination: Secondary | ICD-10-CM | POA: Diagnosis not present

## 2022-05-11 DIAGNOSIS — E785 Hyperlipidemia, unspecified: Secondary | ICD-10-CM

## 2022-05-11 DIAGNOSIS — E1169 Type 2 diabetes mellitus with other specified complication: Secondary | ICD-10-CM

## 2022-05-11 NOTE — Patient Instructions (Signed)
Medication Instructions:  Your physician recommends that you continue on your current medications as directed. Please refer to the Current Medication list given to you today. *If you need a refill on your cardiac medications before your next appointment, please call your pharmacy*   Lab Work: None Ordered  Testing/Procedures: None Ordered   Follow-Up: At [facility], you and your health needs are our priority.  As part of our continuing mission to provide you with exceptional heart care, we have created designated Provider Care Teams.  These Care Teams include your primary Cardiologist (physician) and Advanced Practice Providers (APPs -  Physician Assistants and Nurse Practitioners) who all work together to provide you with the care you need, when you need it.  We recommend signing up for the patient portal called "MyChart".  Sign up information is provided on this After Visit Summary.  MyChart is used to connect with patients for Virtual Visits (Telemedicine).  Patients are able to view lab/test results, encounter notes, upcoming appointments, etc.  Non-urgent messages can be sent to your provider as well.   To learn more about what you can do with MyChart, go to [email or web address].    Your next appointment:   12 month(s)  The format for your next appointment:   In Person  Provider:   Freada Bergeron, MD     Other Instructions YOU [place].  Important Information About Sugar

## 2022-05-12 ENCOUNTER — Other Ambulatory Visit (HOSPITAL_COMMUNITY): Payer: Self-pay

## 2022-05-12 DIAGNOSIS — M1812 Unilateral primary osteoarthritis of first carpometacarpal joint, left hand: Secondary | ICD-10-CM | POA: Diagnosis not present

## 2022-05-12 MED ORDER — DOCUSATE SODIUM 100 MG PO CAPS: ORAL_CAPSULE | ORAL | 0 refills | Status: DC

## 2022-05-12 MED ORDER — OXYCODONE-ACETAMINOPHEN 10-325 MG PO TABS
1.0000 | ORAL_TABLET | Freq: Four times a day (QID) | ORAL | 0 refills | Status: DC | PRN
  Filled 2022-05-12: qty 20, 5d supply, fill #0

## 2022-05-12 MED ORDER — METHOCARBAMOL 500 MG PO TABS
500.0000 mg | ORAL_TABLET | Freq: Four times a day (QID) | ORAL | 0 refills | Status: DC | PRN
  Filled 2022-05-12: qty 40, 10d supply, fill #0

## 2022-05-17 ENCOUNTER — Telehealth: Payer: Self-pay | Admitting: Gastroenterology

## 2022-05-17 NOTE — Telephone Encounter (Addendum)
Patient had surgery to her hand on 05/04/22. She is scheduled to take Skyrizi on 05/19/22. Is it okay to go forward on schedule or should she wait? Patient advised to take her subq Skyrizi injection.

## 2022-05-17 NOTE — Telephone Encounter (Signed)
It is okay to stay on schedule for Skyrizi injections, she does not have to hold them.  Please advise patient to do the injection.  Thank you

## 2022-05-17 NOTE — Telephone Encounter (Signed)
Patient is calling regarding [facility] and is wishing to speak with the nurse. Please advise

## 2022-05-18 ENCOUNTER — Other Ambulatory Visit: Payer: Self-pay | Admitting: Internal Medicine

## 2022-05-18 ENCOUNTER — Other Ambulatory Visit: Payer: Self-pay | Admitting: Gastroenterology

## 2022-05-18 ENCOUNTER — Other Ambulatory Visit (HOSPITAL_COMMUNITY): Payer: Self-pay

## 2022-05-18 MED ORDER — COLESTIPOL HCL 1 G PO TABS
1.0000 g | ORAL_TABLET | Freq: Every day | ORAL | 1 refills | Status: DC
  Filled 2022-05-18: qty 30, 30d supply, fill #0
  Filled 2022-06-18: qty 30, 30d supply, fill #1
  Filled 2022-07-13: qty 30, 30d supply, fill #2
  Filled 2022-08-23: qty 30, 30d supply, fill #3
  Filled 2022-09-20 (×2): qty 30, 30d supply, fill #4
  Filled 2022-10-20: qty 30, 30d supply, fill #5

## 2022-05-19 ENCOUNTER — Other Ambulatory Visit: Payer: Self-pay

## 2022-05-19 ENCOUNTER — Other Ambulatory Visit (HOSPITAL_COMMUNITY): Payer: Self-pay

## 2022-05-19 ENCOUNTER — Encounter (HOSPITAL_COMMUNITY): Payer: Self-pay

## 2022-05-19 MED ORDER — MONTELUKAST SODIUM 10 MG PO TABS
10.0000 mg | ORAL_TABLET | Freq: Every evening | ORAL | 3 refills | Status: DC
  Filled 2022-05-19: qty 30, 30d supply, fill #0
  Filled 2022-07-05: qty 30, 30d supply, fill #1
  Filled 2022-08-02: qty 30, 30d supply, fill #2
  Filled 2022-09-07: qty 30, 30d supply, fill #3
  Filled 2022-10-07: qty 30, 30d supply, fill #4
  Filled 2022-11-02: qty 30, 30d supply, fill #5
  Filled 2022-12-11: qty 30, 30d supply, fill #6

## 2022-05-20 ENCOUNTER — Other Ambulatory Visit (HOSPITAL_COMMUNITY): Payer: Self-pay

## 2022-05-20 ENCOUNTER — Other Ambulatory Visit: Payer: Self-pay

## 2022-05-20 ENCOUNTER — Other Ambulatory Visit: Payer: Self-pay | Admitting: Internal Medicine

## 2022-05-20 MED ORDER — GLIMEPIRIDE 2 MG PO TABS
2.0000 mg | ORAL_TABLET | Freq: Every day | ORAL | 3 refills | Status: DC
  Filled 2022-05-20: qty 30, 30d supply, fill #0
  Filled 2022-07-05: qty 30, 30d supply, fill #1
  Filled 2022-08-02: qty 30, 30d supply, fill #2
  Filled 2022-09-07: qty 30, 30d supply, fill #3

## 2022-05-25 ENCOUNTER — Ambulatory Visit: Payer: BC Managed Care – PPO | Admitting: Cardiology

## 2022-05-25 DIAGNOSIS — M13842 Other specified arthritis, left hand: Secondary | ICD-10-CM | POA: Diagnosis not present

## 2022-06-03 DIAGNOSIS — M13842 Other specified arthritis, left hand: Secondary | ICD-10-CM | POA: Diagnosis not present

## 2022-06-18 ENCOUNTER — Emergency Department (HOSPITAL_COMMUNITY): Payer: BC Managed Care – PPO

## 2022-06-18 ENCOUNTER — Other Ambulatory Visit (HOSPITAL_COMMUNITY): Payer: Self-pay

## 2022-06-18 ENCOUNTER — Emergency Department (HOSPITAL_COMMUNITY)
Admission: EM | Admit: 2022-06-18 | Discharge: 2022-06-19 | Disposition: A | Discharge status: Home / Self Care | Payer: BC Managed Care – PPO | Attending: Emergency Medicine | Admitting: Emergency Medicine

## 2022-06-18 ENCOUNTER — Other Ambulatory Visit: Payer: Self-pay

## 2022-06-18 ENCOUNTER — Ambulatory Visit: Payer: BC Managed Care – PPO | Admitting: Family Medicine

## 2022-06-18 ENCOUNTER — Encounter: Payer: Self-pay | Admitting: Family Medicine

## 2022-06-18 ENCOUNTER — Encounter (HOSPITAL_COMMUNITY): Payer: Self-pay

## 2022-06-18 VITALS — BP 136/78 | HR 93 | Temp 97.6°F | Ht 63.0 in | Wt 137.0 lb

## 2022-06-18 DIAGNOSIS — Z955 Presence of coronary angioplasty implant and graft: Secondary | ICD-10-CM | POA: Diagnosis not present

## 2022-06-18 DIAGNOSIS — I10 Essential (primary) hypertension: Secondary | ICD-10-CM | POA: Insufficient documentation

## 2022-06-18 DIAGNOSIS — R0602 Shortness of breath: Secondary | ICD-10-CM

## 2022-06-18 DIAGNOSIS — Z7982 Long term (current) use of aspirin: Secondary | ICD-10-CM | POA: Insufficient documentation

## 2022-06-18 DIAGNOSIS — R079 Chest pain, unspecified: Secondary | ICD-10-CM

## 2022-06-18 DIAGNOSIS — Z95820 Peripheral vascular angioplasty status with implants and grafts: Secondary | ICD-10-CM | POA: Diagnosis not present

## 2022-06-18 DIAGNOSIS — Z79899 Other long term (current) drug therapy: Secondary | ICD-10-CM | POA: Diagnosis not present

## 2022-06-18 DIAGNOSIS — Z7984 Long term (current) use of oral hypoglycemic drugs: Secondary | ICD-10-CM | POA: Diagnosis not present

## 2022-06-18 DIAGNOSIS — I2510 Atherosclerotic heart disease of native coronary artery without angina pectoris: Secondary | ICD-10-CM | POA: Insufficient documentation

## 2022-06-18 DIAGNOSIS — E119 Type 2 diabetes mellitus without complications: Secondary | ICD-10-CM | POA: Insufficient documentation

## 2022-06-18 DIAGNOSIS — Z794 Long term (current) use of insulin: Secondary | ICD-10-CM | POA: Insufficient documentation

## 2022-06-18 DIAGNOSIS — I25119 Atherosclerotic heart disease of native coronary artery with unspecified angina pectoris: Secondary | ICD-10-CM

## 2022-06-18 DIAGNOSIS — R0789 Other chest pain: Secondary | ICD-10-CM | POA: Diagnosis not present

## 2022-06-18 LAB — CBC
HCT: 41.9 % (ref 36.0–46.0)
Hemoglobin: 13.8 g/dL (ref 12.0–15.0)
MCH: 29.3 pg (ref 26.0–34.0)
MCHC: 32.9 g/dL (ref 30.0–36.0)
MCV: 89 fL (ref 80.0–100.0)
Platelets: 330 10*3/uL (ref 150–400)
RBC: 4.71 MIL/uL (ref 3.87–5.11)
RDW: 12.8 % (ref 11.5–15.5)
WBC: 10.1 10*3/uL (ref 4.0–10.5)
nRBC: 0 % (ref 0.0–0.2)

## 2022-06-18 LAB — TROPONIN I (HIGH SENSITIVITY)
Troponin I (High Sensitivity): 4 ng/L (ref <18)
Troponin I (High Sensitivity): 4 ng/L (ref <18)

## 2022-06-18 LAB — BASIC METABOLIC PANEL
Anion gap: 12 (ref 5–15)
BUN: 13 mg/dL (ref 8–23)
CO2: 23 mmol/L (ref 22–32)
Calcium: 9.3 mg/dL (ref 8.9–10.3)
Chloride: 102 mmol/L (ref 98–111)
Creatinine, Ser: 0.72 mg/dL (ref 0.44–1.00)
GFR, Estimated: >60 mL/min (ref >60)
Glucose, Bld: 131 mg/dL — ABNORMAL HIGH (ref 70–99)
Potassium: 3.9 mmol/L (ref 3.5–5.1)
Sodium: 137 mmol/L (ref 135–145)

## 2022-06-18 MED ORDER — IOHEXOL 350 MG/ML SOLN
75.0000 mL | Freq: Once | INTRAVENOUS | Status: AC | PRN
  Administered 2022-06-18: 75 mL via INTRAVENOUS

## 2022-06-18 NOTE — ED Provider Triage Note (Signed)
Emergency Medicine Provider Triage Evaluation Note  DIONNA WIEDEMANN , a 62 y.o. female  was evaluated in triage.  Pt complains of right-sided chest pain, shortness of breath.  Patient reports chest pain beginning yesterday evening.  States that the pain is worsened with taking a deep breath.  Denies history of DVT/PE states she had operation on her left arm approximately 5 to 6 weeks ago.  No lower extremity swelling reported.  Reports mild cough denies fever, chills, night sweats, abdominal pain, nausea, vomiting..  Review of Systems  Positive: See above Negative:   Physical Exam  BP 125/70 (BP Location: Right Arm)   Pulse 88   Temp 98.4 F (36.9 C) (Oral)   Resp 19   LMP  (LMP Unknown)   SpO2 96%  Gen:   Awake, no distress   Resp:  Normal effort  MSK:   Moves extremities without difficulty  Other:    Medical Decision Making  Medically screening exam initiated at 4:24 PM.  Appropriate orders placed.  [name] was informed that the remainder of the evaluation will be completed by another provider, this initial triage assessment does not replace that evaluation, and the importance of remaining in the ED until their evaluation is complete.     Wilnette Kales, [place] 06/18/22 1625

## 2022-06-18 NOTE — ED Provider Notes (Signed)
[facility] Provider Note   CSN: [id number] Arrival date & time: 06/18/22  1528     History  Chief Complaint  Patient presents with   Chest Pain    [name] is a 62 y.o. female.  The history is provided by the patient and medical records.  Chest Pain  62 year old female with history of anxiety, coronary artery disease status post stenting, hypertension, diabetes, GERD, hyperlipidemia, presenting to the ED with chest pain.  Patient states this began yesterday and seems to have persisted.  Pain is sharp in nature beneath her right breast.  States she does have increased pain with deep breathing, almost feels like a pinching sensation.  She does feel somewhat SOB, worse with exertion.  Hx DES to LAD 2018, does follow with cardiology (Dr. Johney Frame).  Quit smoking in 2006.  No hx of DVT or PE.  No recent travel but did have surgery on left arm just before Christmas, currently still in cast.  Home Medications Prior to Admission medications   Medication Sig Start Date End Date Taking? Authorizing Provider  acetaminophen (TYLENOL) 650 MG CR tablet Take 650 mg by mouth every 8 (eight) hours as needed for pain.    [provider]  albuterol (PROVENTIL) (2.5 MG/3ML) 0.083% nebulizer solution Take 2.5 mg by nebulization every 4 (four) hours as needed for wheezing or shortness of breath.     [provider]  albuterol (VENTOLIN HFA) 108 (90 Base) MCG/ACT inhaler Inhale 1-2 puffs into the lungs every 6 (six) hours as needed for wheezing or shortness of breath.    [provider]  aspirin EC 81 MG tablet Take 81 mg by mouth daily. Swallow whole.    [provider]  Blood Glucose Monitoring Suppl (FREESTYLE LITE) w/Device KIT Use to check blood sugar up to 4 times a day as directed 03/10/21   Hoyt Koch, MD  budesonide (ENTOCORT EC) 3 MG 24 hr capsule Take 3 capsules (9 mg total) by mouth daily. 07/08/21    Mauri Pole, MD  busPIRone (BUSPAR) 10 MG tablet Take 1 tablet (10 mg total) by mouth 2 (two) times daily. 12/05/20     colestipol (COLESTID) 1 g tablet Take 1 tablet (1 g total) by mouth daily. 05/18/22   Mauri Pole, MD  Continuous Blood Gluc Sensor (FREESTYLE LIBRE 14 DAY SENSOR) MISC Use to monitor sugars 04/03/21   Hoyt Koch, MD  Continuous Blood Gluc Sensor ([place]) MISC Use for blood sugar monitoring 04/03/21   Hoyt Koch, MD  cyanocobalamin (,VITAMIN B-12,) 1000 MCG/ML injection 1 ML  B12 injection every 2 weeks for 3 months then decrease to 1 B12 injection to once a month 12/16/20   Nandigam, Venia Minks, MD  dicyclomine (BENTYL) 20 MG tablet Take 1 tablet (20 mg total) by mouth every 6 (six) hours. 06/02/21   Gatha Mayer, MD  docusate sodium (COLACE) 100 MG capsule Take 1 capsule every day by oral route for 10 days. 05/12/22     DULoxetine (CYMBALTA) 60 MG capsule Take 1 capsule (60 mg total) by mouth daily. 02/03/22   Hoyt Koch, MD  empagliflozin (JARDIANCE) 25 MG TABS tablet Take 1 tablet (25 mg total) by mouth daily before breakfast. 03/02/22   Hoyt Koch, MD  enalapril (VASOTEC) 10 MG tablet Take 1 tablet (10 mg total) by mouth daily. 12/07/21   Freada Bergeron, MD  Evolocumab (REPATHA  SURECLICK) 833 MG/ML SOAJ Inject 1 Pen into the skin every 14 (fourteen) days. 12/24/21   Freada Bergeron, MD  fenofibrate (TRICOR) 145 MG tablet TAKE ONE TABLET BY MOUTH ONCE DAILY 12/07/21   Freada Bergeron, MD  furosemide (LASIX) 40 MG tablet Take 1 tablet (40 mg total) by mouth daily as needed for fluid or edema. 11/27/19   Dorothy Spark, MD  glimepiride (AMARYL) 2 MG tablet Take 1 tablet (2 mg total) by mouth daily before breakfast. 05/20/22   Hoyt Koch, MD  glucose blood (TRUE METRIX BLOOD GLUCOSE TEST) test strip Use to check blood sugar up to 4 times a day as directed. 09/23/21   Hoyt Koch, MD  insulin glargine-yfgn (SEMGLEE, YFGN,) 100 UNIT/ML Pen Inject 48-55 Units into the skin daily. 02/02/22   Hoyt Koch, MD  Insulin Pen Needle (PEN NEEDLES 3/16") 31G X 5 MM MISC Use daily 05/10/22   Hoyt Koch, MD  Lancets (FREESTYLE) lancets Use to check blood sugar up to 4 times a day as directed 03/10/21   Hoyt Koch, MD  levocetirizine (XYZAL) 5 MG tablet Take 1 tablet  by mouth every evening. 04/27/22   Hoyt Koch, MD  metFORMIN (GLUCOPHAGE-XR) 500 MG 24 hr tablet Take 2 tablets by mouth daily. 12/16/21   Hoyt Koch, MD  methocarbamol (ROBAXIN) 500 MG tablet Take 1 tablet (500 mg total) by mouth 4 (four) times daily as needed for 10 days. 05/12/22     metoprolol succinate (TOPROL-XL) 25 MG 24 hr tablet Take 1 & 1/2 tablets (37.5 mg total) by mouth daily. 12/07/21   Freada Bergeron, MD  montelukast (SINGULAIR) 10 MG tablet Take 1 tablet (10 mg total) by mouth every evening. 05/19/22   Hoyt Koch, MD  Multiple Vitamins-Minerals (MULTIVITAMIN WITH MINERALS) tablet Take 1 tablet by mouth daily.    [provider]  nitroGLYCERIN (NITROSTAT) 0.4 MG SL tablet Place 1 tablet (0.4 mg total) under the tongue every 5 (five) minutes as needed for chest pain.  Call 911 if no relief after 1st dose. 02/25/21   Loel Dubonnet, NP  ondansetron (ZOFRAN ODT) 4 MG disintegrating tablet Take 1 tablet (4 mg total) by mouth every 4 (four) hours as needed for nausea or vomiting. 10/18/20   Charlesetta Shanks, MD  oxyCODONE-acetaminophen (PERCOCET) 10-325 MG tablet Take 1 tablet by mouth every 6 (six) hours as needed for 5 days. 05/12/22     pantoprazole (PROTONIX) 40 MG tablet Take 1 tablet (40 mg total) by mouth daily. 11/06/21   Mauri Pole, MD  Probiotic Product (PROBIOTIC DAILY PO) Take 1 capsule by mouth daily.    [provider]  promethazine-dextromethorphan (PROMETHAZINE-DM) 6.25-15 MG/5ML syrup Take 5 mLs by  mouth 4 (four) times daily as needed. 03/03/22   Hoyt Koch, MD  Risankizumab-rzaa [facility]) 360 MG/2.4ML SOCT Week 12 begin 360 mg SQ injection by cartridge and repeat every 8 weeks 02/08/22   Mauri Pole, MD  sodium chloride (OCEAN) 0.65 % SOLN nasal spray Place 1 spray into both nostrils daily as needed for congestion.    [provider]  VASCEPA 1 g capsule Take 2 capsules by mouth 2 times daily. 12/24/21   Freada Bergeron, MD  Vitamin D3 (VITAMIN D) 25 MCG tablet Take 1,000 Units by mouth at bedtime.    [provider]  EPIPEN 2-PAK 0.3 MG/0.3ML SOAJ injection Inject 0.3 mg into the muscle as needed  for anaphylaxis. Reported on 06/27/2015 10/09/14   [provider]  insulin glargine (LANTUS) 100 UNIT/ML injection Inject 0.48 mLs (48 Units total) into the skin at bedtime. 12/15/20 12/22/20  Hoyt Koch, MD      Allergies    Lipitor [atorvastatin], Remicade [infliximab], Ticagrelor, and Rosuvastatin    Review of Systems   Review of Systems  Cardiovascular:  Positive for chest pain.  All other systems reviewed and are negative.   Physical Exam Updated Vital Signs BP (!) 155/63   Pulse 93   Temp 98.8 F (37.1 C)   Resp 16   Ht '5\' 3"'$  (1.6 m)   Wt 62.1 kg   LMP  (LMP Unknown)   SpO2 99%   BMI 24.27 kg/m   Physical Exam Vitals and nursing note reviewed.  Constitutional:      Appearance: She is well-developed.  HENT:     Head: Normocephalic and atraumatic.  Eyes:     Conjunctiva/sclera: Conjunctivae normal.     Pupils: Pupils are equal, round, and reactive to light.  Cardiovascular:     Rate and Rhythm: Normal rate and regular rhythm.     Heart sounds: Normal heart sounds.  Pulmonary:     Effort: Pulmonary effort is normal.     Breath sounds: Normal breath sounds.  Abdominal:     General: Bowel sounds are normal.     Palpations: Abdomen is soft.  Musculoskeletal:        General: Normal range of motion.     Cervical  back: Normal range of motion.     Comments: Left wrist/forearm in cast  Skin:    General: Skin is warm and dry.  Neurological:     Mental Status: She is alert and oriented to person, place, and time.     ED Results / Procedures / Treatments   Labs (all labs ordered are listed, but only abnormal results are displayed) Labs Reviewed  BASIC METABOLIC PANEL - Abnormal; Notable for the following components:      Result Value   Glucose, Bld 131 (*)    All other components within normal limits  CBC  TROPONIN I (HIGH SENSITIVITY)  TROPONIN I (HIGH SENSITIVITY)    EKG EKG Interpretation  Date/Time:  Friday June 18 2022 15:36:38 EST Ventricular Rate:  85 PR Interval:  144 QRS Duration: 92 QT Interval:  374 QTC Calculation: 445 R Axis:   80 Text Interpretation: Normal sinus rhythm Normal ECG When compared with ECG of 09-May-2022 17:53, PREVIOUS ECG IS PRESENT No significant change since last tracing Confirmed by Isla Pence [phone]) on 06/18/2022 9:23:48 PM  Radiology CT Angio Chest PE W and/or Wo Contrast  Result Date: 06/19/2022 CLINICAL DATA:  Right-sided chest pain. EXAM: CT ANGIOGRAPHY CHEST WITH CONTRAST TECHNIQUE: Multidetector CT imaging of the chest was performed using the standard protocol during bolus administration of intravenous contrast. Multiplanar CT image reconstructions and MIPs were obtained to evaluate the vascular anatomy. RADIATION DOSE REDUCTION: This exam was performed according to the departmental dose-optimization program which includes automated exposure control, adjustment of the mA and/or kV according to patient size and/or use of iterative reconstruction technique. CONTRAST:  33m OMNIPAQUE IOHEXOL 350 MG/ML SOLN COMPARISON:  April 29, 2011 FINDINGS: Cardiovascular: There is marked severity calcification of the aortic arch, without evidence of aortic aneurysm satisfactory opacification of the pulmonary arteries to the segmental level. No evidence of  pulmonary embolism. Normal heart size with moderate severity coronary artery calcification. No pericardial effusion. Mediastinum/Nodes: No  enlarged mediastinal, hilar, or axillary lymph nodes. Thyroid gland, trachea, and esophagus demonstrate no significant findings. Lungs/Pleura: There is mild biapical scarring and/or atelectasis. There is no evidence of an acute infiltrate, pleural effusion or pneumothorax. Upper Abdomen: No acute abnormality. Musculoskeletal: No chest wall abnormality. No acute or significant osseous findings. Review of the MIP images confirms the above findings. IMPRESSION: 1. No evidence of pulmonary embolism or other acute intrathoracic process. 2. Mild biapical scarring and/or atelectasis. 3. Moderate severity coronary artery calcification. 4. Aortic atherosclerosis. Aortic Atherosclerosis (ICD10-I70.0). Electronically Signed   By: Virgina Norfolk M.D.   On: 06/19/2022 00:04   DG Chest 2 View  Result Date: 06/18/2022 CLINICAL DATA:  Right-sided chest pain EXAM: CHEST - 2 VIEW COMPARISON:  Chest x-ray May 09, 2022 FINDINGS: The cardiomediastinal silhouette is unchanged in contour. Chronic left basilar linear opacity, likely scarring. No acute focal pulmonary opacity. No pleural effusion or pneumothorax. The visualized upper abdomen is unremarkable. No acute osseous abnormality. IMPRESSION: No acute cardiopulmonary abnormality. Electronically Signed   By: Beryle Flock M.D.   On: 06/18/2022 16:33    Procedures Procedures    Medications Ordered in ED Medications  iohexol (OMNIPAQUE) 350 MG/ML injection 75 mL (75 mLs Intravenous Contrast Given 06/18/22 2354)    ED Course/ Medical Decision Making/ A&P                             Medical Decision Making Amount and/or Complexity of Data Reviewed Labs: ordered. Radiology: ordered and independent interpretation performed. ECG/medicine tests: ordered and independent interpretation performed.  Risk Prescription drug  management.   62 year old female presenting to the ED with right-sided chest pain of the past 24 hours.  Pain is sharp, stabbing, localized beneath the right breast.  She does have some shortness of breath with exertion, pain is worse with deep breathing.  EKG is unchanged from prior.  Labs are overall reassuring--no leukocytosis, no electrolyte derangement, troponin negative x 2.  Chest x-ray is clear.  Chest pain does have somewhat of a pleuritic component and she did recently have surgery on her left arm.  CTA of the chest was obtained, no findings of PE, occult pneumonia, or other abnormalities.  VS remain stable.  She has NSAIDs and leftover oxycodone from her prior surgery, advised she may want to start taking scheduled anti-inflammatories to see if this helps.  I recommended that she follow-up closely with her primary care doctor and/or cardiologist-- copies of labs/imaging given to patient for physician review.  Can return here for new concerns.  Final Clinical Impression(s) / ED Diagnoses Final diagnoses:  Chest pain in adult    Rx / DC Orders ED Discharge Orders     None         Larene Pickett, PA-C 06/19/22 [id number]    Isla Pence, MD 06/19/22 [phone]

## 2022-06-18 NOTE — Progress Notes (Signed)
Subjective:     Patient ID: [name], female    DOB: Dec 05, 1960, 62 y.o.   MRN: [id number]  Chief Complaint  Patient presents with   Chest Pain    Right lung behind her breast hurts when she breathes in deep, started last night. Also mentioned right arm aching for a couple days. Worse when walking or standing    Chest Pain    Patient is in today for right sided exertional chest pain since last night. Pain radiates through to her back at times and down her right arm. Shortness of breath and nausea also.  Pain is currently 4/10.   PMH of CAD s/p NSTEMI 2018, HL, DM type II, and Crohn's disease.   On ASA 81 mg, Repatha, Jardiance, fenofribrate, Toprol XL, enalapril and Vascepa.   Take Protonix for GERD.   Health Maintenance Due  Topic Date Due   COVID-19 Vaccine (1) Never done   Zoster Vaccines- Shingrix (1 of 2) Never done   OPHTHALMOLOGY EXAM  02/07/2015   MAMMOGRAM  10/08/2016   PAP SMEAR-Modifier  11/05/2019    Past Medical History:  Diagnosis Date   Allergic rhinitis due to pollen    Allergy    Anxiety state, unspecified    Arthritis    Bronchitis    hx   CAD (coronary artery disease)    s/p NSTEMI in 11/18 treated with a DES to the proximal LAD   Calculus of gallbladder without mention of cholecystitis or obstruction    Crohn disease ([place])    Depressive disorder, not elsewhere classified    Diabetes mellitus without complication ([place])    diet controlled   Esophageal reflux    Fibromyalgia    Grade I diastolic dysfunction    Noted on ECHO   Headache    History of nuclear stress test    ETT-Myoview 10/17: EF 55%, normal perfusion; Low Risk // Nuclear stress test 05/2018:  EF 63, normal perfusion; Low Risk   Myalgia and myositis, unspecified    NSTEMI (non-ST elevated myocardial infarction) ([place]) 04/15/2017   Other and unspecified hyperlipidemia    Palpitations    Pneumonia 15   hx   S/P angioplasty with stent 04/18/17 with DES to pLAD 04/19/2017    Seizures ([place])    1 seizure as a teenager    Unspecified essential hypertension    Vitamin B deficiency     Past Surgical History:  Procedure Laterality Date   BIOPSY  12/05/2018   Procedure: BIOPSY;  Surgeon: Mauri Pole, MD;  Location: [facility];  Service: Endoscopy;;   CHOLECYSTECTOMY N/A 09/19/2014   Procedure: LAPAROSCOPIC CHOLECYSTECTOMY WITH INTRAOPERATIVE CHOLANGIOGRAM;  Surgeon: Donnie Mesa, MD;  Location: [facility];  Service: General;  Laterality: N/A;   COLONOSCOPY WITH PROPOFOL N/A 12/05/2018   Procedure: COLONOSCOPY WITH PROPOFOL;  Surgeon: Mauri Pole, MD;  [facility]: [facility];  Service: Endoscopy;  Laterality: N/A;  chromoendoscopy   CORONARY STENT INTERVENTION N/A 04/18/2017   Procedure: CORONARY STENT INTERVENTION;  Surgeon: Martinique, Peter M, MD;  Location: [facility];  Service: Cardiovascular;  Laterality: N/A;   ESOPHAGOGASTRODUODENOSCOPY     EXPLORATORY LAPAROTOMY     for endometriosis   INTRAVASCULAR PRESSURE WIRE/FFR STUDY N/A 04/18/2017   Procedure: INTRAVASCULAR PRESSURE WIRE/FFR STUDY;  Surgeon: Martinique, Peter M, MD;  Location: [facility];  Service: Cardiovascular;  Laterality: N/A;   LEFT HEART CATH AND CORONARY ANGIOGRAPHY N/A 04/18/2017   Procedure: LEFT HEART CATH AND CORONARY  ANGIOGRAPHY;  Surgeon: Martinique, Peter M, MD;  Location: [facility];  Service: Cardiovascular;  Laterality: N/A;   POLYPECTOMY  12/05/2018   Procedure: POLYPECTOMY;  Surgeon: Mauri Pole, MD;  [facility];  Service: Endoscopy;;   SUBMUCOSAL LIFTING INJECTION  12/05/2018   Procedure: SUBMUCOSAL LIFTING INJECTION;  Surgeon: Mauri Pole, MD;  [facility];  Service: Endoscopy;;   TONSILLECTOMY AND ADENOIDECTOMY     TUBAL LIGATION     BILATERAL    Family History  Problem Relation Age of Onset   Lung cancer Maternal Grandmother    Stroke Father    Hypertension Mother    Heart attack Mother    Heart disease Mother     Hypertension Brother    Heart disease Brother    Hypertension Sister    Heart disease Sister    Coronary artery disease Brother    Colon cancer Neg Hx    Stomach cancer Neg Hx    Esophageal cancer Neg Hx    Rectal cancer Neg Hx     Social History   Socioeconomic History   Marital status: Married    Spouse name: Dane   Number of children: 2   Years of education: 12+   Highest education level: Not on file  Occupational History   Occupation: ACCOUNT REP.    Employer: [employer]    Comment: retired  Tobacco Use   Smoking status: Former    Packs/day: 1.00    Years: 35.00    Total pack years: 35.00    Types: Cigarettes    Quit date: 05/31/2004    Years since quitting: 18.0   Smokeless tobacco: Never  Vaping Use   Vaping Use: Never used  Substance and Sexual Activity   Alcohol use: No   Drug use: No   Sexual activity: Not Currently    Comment: married  Other Topics Concern   Not on file  Social History Narrative   Lives with her husband and their pets. Her children are adults and live independently.   Social Determinants of Health   Financial Resource Strain: Not on file  Food Insecurity: No Food Insecurity (04/06/2022)   Hunger Vital Sign    Worried About Running Out of Food in the Last Year: Never true    Ran Out of Food in the Last Year: Never true  Transportation Needs: No Transportation Needs (04/06/2022)   PRAPARE - Hydrologist (Medical): No    Lack of Transportation (Non-Medical): No  Physical Activity: Not on file  Stress: Not on file  Social Connections: Not on file  Intimate Partner Violence: Not on file    Outpatient Medications Prior to Visit  Medication Sig Dispense Refill   acetaminophen (TYLENOL) 650 MG CR tablet Take 650 mg by mouth every 8 (eight) hours as needed for pain.     albuterol (PROVENTIL) (2.5 MG/3ML) 0.083% nebulizer solution Take 2.5 mg by nebulization every 4 (four) hours as needed for  wheezing or shortness of breath.      albuterol (VENTOLIN HFA) 108 (90 Base) MCG/ACT inhaler Inhale 1-2 puffs into the lungs every 6 (six) hours as needed for wheezing or shortness of breath.     aspirin EC 81 MG tablet Take 81 mg by mouth daily. Swallow whole.     Blood Glucose Monitoring Suppl (FREESTYLE LITE) w/Device KIT Use to check blood sugar up to 4 times a day as directed 1 kit 0  budesonide (ENTOCORT EC) 3 MG 24 hr capsule Take 3 capsules (9 mg total) by mouth daily. 270 capsule 0   busPIRone (BUSPAR) 10 MG tablet Take 1 tablet (10 mg total) by mouth 2 (two) times daily. 180 tablet 0   colestipol (COLESTID) 1 g tablet Take 1 tablet (1 g total) by mouth daily. 90 tablet 1   Continuous Blood Gluc Sensor (FREESTYLE LIBRE 14 DAY SENSOR) MISC Use to monitor sugars 2 each 11   Continuous Blood Gluc Sensor (FREESTYLE LIBRE SENSOR SYSTEM) MISC Use for blood sugar monitoring 1 each 0   cyanocobalamin (,VITAMIN B-12,) 1000 MCG/ML injection 1 ML  B12 injection every 2 weeks for 3 months then decrease to 1 B12 injection to once a month 1 mL 12   dicyclomine (BENTYL) 20 MG tablet Take 1 tablet (20 mg total) by mouth every 6 (six) hours. 60 tablet 0   docusate sodium (COLACE) 100 MG capsule Take 1 capsule every day by oral route for 10 days. 10 capsule 0   DULoxetine (CYMBALTA) 60 MG capsule Take 1 capsule (60 mg total) by mouth daily. 90 capsule 1   empagliflozin (JARDIANCE) 25 MG TABS tablet Take 1 tablet (25 mg total) by mouth daily before breakfast. 30 tablet 3   enalapril (VASOTEC) 10 MG tablet Take 1 tablet (10 mg total) by mouth daily. 90 tablet 3   Evolocumab (REPATHA SURECLICK) 992 MG/ML SOAJ Inject 1 Pen into the skin every 14 (fourteen) days. 2 mL 11   fenofibrate (TRICOR) 145 MG tablet TAKE ONE TABLET BY MOUTH ONCE DAILY 90 tablet 3   furosemide (LASIX) 40 MG tablet Take 1 tablet (40 mg total) by mouth daily as needed for fluid or edema. 30 tablet 2   glimepiride (AMARYL) 2 MG tablet Take  1 tablet (2 mg total) by mouth daily before breakfast. 30 tablet 3   glucose blood (TRUE METRIX BLOOD GLUCOSE TEST) test strip Use to check blood sugar up to 4 times a day as directed. 100 each 3   insulin glargine-yfgn (SEMGLEE, YFGN,) 100 UNIT/ML Pen Inject 48-55 Units into the skin daily. 30 mL 3   Insulin Pen Needle (PEN NEEDLES 3/16") 31G X 5 MM MISC Use daily 50 each 11   Lancets (FREESTYLE) lancets Use to check blood sugar up to 4 times a day as directed 100 each 0   levocetirizine (XYZAL) 5 MG tablet Take 1 tablet  by mouth every evening. 90 tablet 0   metFORMIN (GLUCOPHAGE-XR) 500 MG 24 hr tablet Take 2 tablets by mouth daily. 60 tablet 5   methocarbamol (ROBAXIN) 500 MG tablet Take 1 tablet (500 mg total) by mouth 4 (four) times daily as needed for 10 days. 40 tablet 0   metoprolol succinate (TOPROL-XL) 25 MG 24 hr tablet Take 1 & 1/2 tablets (37.5 mg total) by mouth daily. 135 tablet 3   montelukast (SINGULAIR) 10 MG tablet Take 1 tablet (10 mg total) by mouth every evening. 90 tablet 3   Multiple Vitamins-Minerals (MULTIVITAMIN WITH MINERALS) tablet Take 1 tablet by mouth daily.     nitroGLYCERIN (NITROSTAT) 0.4 MG SL tablet Place 1 tablet (0.4 mg total) under the tongue every 5 (five) minutes as needed for chest pain.  Call 911 if no relief after 1st dose. 25 tablet 5   ondansetron (ZOFRAN ODT) 4 MG disintegrating tablet Take 1 tablet (4 mg total) by mouth every 4 (four) hours as needed for nausea or vomiting. 20 tablet 0   oxyCODONE-acetaminophen (PERCOCET)  10-325 MG tablet Take 1 tablet by mouth every 6 (six) hours as needed for 5 days. 20 tablet 0   pantoprazole (PROTONIX) 40 MG tablet Take 1 tablet (40 mg total) by mouth daily. 240 tablet 0   Probiotic Product (PROBIOTIC DAILY PO) Take 1 capsule by mouth daily.     promethazine-dextromethorphan (PROMETHAZINE-DM) 6.25-15 MG/5ML syrup Take 5 mLs by mouth 4 (four) times daily as needed. 118 mL 0   Risankizumab-rzaa (SKYRIZI) 360  MG/2.4ML SOCT Week 12 begin 360 mg SQ injection by cartridge and repeat every 8 weeks 2.4 mL 7   sodium chloride (OCEAN) 0.65 % SOLN nasal spray Place 1 spray into both nostrils daily as needed for congestion.     VASCEPA 1 g capsule Take 2 capsules by mouth 2 times daily. 360 capsule 3   Vitamin D3 (VITAMIN D) 25 MCG tablet Take 1,000 Units by mouth at bedtime.     No facility-administered medications prior to visit.    Allergies  Allergen Reactions   Lipitor [Atorvastatin] Other (See Comments)    Elevated ALT   Remicade [Infliximab]     REACTION: SOB   Ticagrelor     Shortness of breath   Rosuvastatin Other (See Comments)    Pt reports causes muscle aches and leg pains and feet to swell    Review of Systems  Cardiovascular:  Positive for chest pain.       Objective:    Physical Exam Constitutional:      General: She is not in acute distress.    Appearance: She is ill-appearing.  Cardiovascular:     Rate and Rhythm: Normal rate and regular rhythm.  Pulmonary:     Effort: Pulmonary effort is normal.     Breath sounds: Normal breath sounds.  Skin:    General: Skin is warm and dry.  Neurological:     General: No focal deficit present.     Mental Status: She is alert and oriented to person, place, and time.  Psychiatric:        Mood and Affect: Mood normal.        Behavior: Behavior normal.     BP 136/78 (BP Location: Left Arm, Patient Position: Sitting, Cuff Size: Large)   Pulse 93   Temp 97.6 F (36.4 C) (Temporal)   Ht '5\' 3"'$  (1.6 m)   Wt 137 lb (62.1 kg)   LMP  (LMP Unknown)   SpO2 99%   BMI 24.27 kg/m  Wt Readings from Last 3 Encounters:  06/18/22 137 lb (62.1 kg)  05/11/22 141 lb (64 kg)  03/02/22 141 lb (64 kg)       Assessment & Plan:   Problem List Items Addressed This Visit       Other   S/P angioplasty with stent 04/18/17 with DES to pLAD   SOB (shortness of breath)   Other Visit Diagnoses     Exertional chest pain    -  Primary    Coronary artery disease involving native coronary artery of native heart with angina pectoris (HCC)          Exertional chest pain with shortness of breath, nausea and pain radiating to the back and down right arm since last night. Pain is now 4/10.  EKG shows NSR, normal rate, no ST elevation.  EMS here for transport to ED for further evaluation to rule out MI with hx of same.   I am having Lodema Pilot. [place] maintain her albuterol, vitamin D3, multivitamin  with minerals, Probiotic Product (PROBIOTIC DAILY PO), furosemide, aspirin EC, ondansetron, acetaminophen, sodium chloride, cyanocobalamin, busPIRone, albuterol, nitroGLYCERIN, FreeStyle Lite, freestyle, FreeStyle Libre 14 Day Sensor, [employer], dicyclomine, budesonide, True Metrix Blood Glucose Test, pantoprazole, enalapril, fenofibrate, metoprolol succinate, metFORMIN, Repatha SureClick, Vascepa, insulin glargine-yfgn, DULoxetine, Skyrizi, empagliflozin, promethazine-dextromethorphan, levocetirizine, Pen Needles 3/16", oxyCODONE-acetaminophen, methocarbamol, docusate sodium, montelukast, colestipol, and glimepiride.  No orders of the defined types were placed in this encounter.

## 2022-06-18 NOTE — ED Provider Notes (Incomplete)
[facility] Provider Note   CSN: [id number] Arrival date & time: 06/18/22  1528     History {Add pertinent medical, surgical, social history, OB history to HPI:1} Chief Complaint  Patient presents with   Chest Pain    [name] is a 62 y.o. female.   Chest Pain      Home Medications Prior to Admission medications   Medication Sig Start Date End Date Taking? Authorizing Provider  acetaminophen (TYLENOL) 650 MG CR tablet Take 650 mg by mouth every 8 (eight) hours as needed for pain.    [provider]  albuterol (PROVENTIL) (2.5 MG/3ML) 0.083% nebulizer solution Take 2.5 mg by nebulization every 4 (four) hours as needed for wheezing or shortness of breath.     [provider]  albuterol (VENTOLIN HFA) 108 (90 Base) MCG/ACT inhaler Inhale 1-2 puffs into the lungs every 6 (six) hours as needed for wheezing or shortness of breath.    [provider]  aspirin EC 81 MG tablet Take 81 mg by mouth daily. Swallow whole.    [provider]  Blood Glucose Monitoring Suppl (FREESTYLE LITE) w/Device KIT Use to check blood sugar up to 4 times a day as directed 03/10/21   Hoyt Koch, MD  budesonide (ENTOCORT EC) 3 MG 24 hr capsule Take 3 capsules (9 mg total) by mouth daily. 07/08/21   Mauri Pole, MD  busPIRone (BUSPAR) 10 MG tablet Take 1 tablet (10 mg total) by mouth 2 (two) times daily. 12/05/20     colestipol (COLESTID) 1 g tablet Take 1 tablet (1 g total) by mouth daily. 05/18/22   Mauri Pole, MD  Continuous Blood Gluc Sensor (FREESTYLE LIBRE 14 DAY SENSOR) MISC Use to monitor sugars 04/03/21   Hoyt Koch, MD  Continuous Blood Gluc Sensor ([place]) MISC Use for blood sugar monitoring 04/03/21   Hoyt Koch, MD  cyanocobalamin (,VITAMIN B-12,) 1000 MCG/ML injection 1 ML  B12 injection every 2 weeks for 3 months then decrease to 1 B12 injection  to once a month 12/16/20   Nandigam, Venia Minks, MD  dicyclomine (BENTYL) 20 MG tablet Take 1 tablet (20 mg total) by mouth every 6 (six) hours. 06/02/21   Gatha Mayer, MD  docusate sodium (COLACE) 100 MG capsule Take 1 capsule every day by oral route for 10 days. 05/12/22     DULoxetine (CYMBALTA) 60 MG capsule Take 1 capsule (60 mg total) by mouth daily. 02/03/22   Hoyt Koch, MD  empagliflozin (JARDIANCE) 25 MG TABS tablet Take 1 tablet (25 mg total) by mouth daily before breakfast. 03/02/22   Hoyt Koch, MD  enalapril (VASOTEC) 10 MG tablet Take 1 tablet (10 mg total) by mouth daily. 12/07/21   Freada Bergeron, MD  Evolocumab (REPATHA SURECLICK) 124 MG/ML SOAJ Inject 1 Pen into the skin every 14 (fourteen) days. 12/24/21   Freada Bergeron, MD  fenofibrate (TRICOR) 145 MG tablet TAKE ONE TABLET BY MOUTH ONCE DAILY 12/07/21   Freada Bergeron, MD  furosemide (LASIX) 40 MG tablet Take 1 tablet (40 mg total) by mouth daily as needed for fluid or edema. 11/27/19   Dorothy Spark, MD  glimepiride (AMARYL) 2 MG tablet Take 1 tablet (2 mg total) by mouth daily before breakfast. 05/20/22   Hoyt Koch, MD  glucose blood (TRUE METRIX BLOOD GLUCOSE TEST) test strip Use to check blood sugar up  to 4 times a day as directed. 09/23/21   Hoyt Koch, MD  insulin glargine-yfgn (SEMGLEE, YFGN,) 100 UNIT/ML Pen Inject 48-55 Units into the skin daily. 02/02/22   Hoyt Koch, MD  Insulin Pen Needle (PEN NEEDLES 3/16") 31G X 5 MM MISC Use daily 05/10/22   Hoyt Koch, MD  Lancets (FREESTYLE) lancets Use to check blood sugar up to 4 times a day as directed 03/10/21   Hoyt Koch, MD  levocetirizine (XYZAL) 5 MG tablet Take 1 tablet  by mouth every evening. 04/27/22   Hoyt Koch, MD  metFORMIN (GLUCOPHAGE-XR) 500 MG 24 hr tablet Take 2 tablets by mouth daily. 12/16/21   Hoyt Koch, MD  methocarbamol (ROBAXIN) 500 MG  tablet Take 1 tablet (500 mg total) by mouth 4 (four) times daily as needed for 10 days. 05/12/22     metoprolol succinate (TOPROL-XL) 25 MG 24 hr tablet Take 1 & 1/2 tablets (37.5 mg total) by mouth daily. 12/07/21   Freada Bergeron, MD  montelukast (SINGULAIR) 10 MG tablet Take 1 tablet (10 mg total) by mouth every evening. 05/19/22   Hoyt Koch, MD  Multiple Vitamins-Minerals (MULTIVITAMIN WITH MINERALS) tablet Take 1 tablet by mouth daily.    [provider]  nitroGLYCERIN (NITROSTAT) 0.4 MG SL tablet Place 1 tablet (0.4 mg total) under the tongue every 5 (five) minutes as needed for chest pain.  Call 911 if no relief after 1st dose. 02/25/21   Loel Dubonnet, NP  ondansetron (ZOFRAN ODT) 4 MG disintegrating tablet Take 1 tablet (4 mg total) by mouth every 4 (four) hours as needed for nausea or vomiting. 10/18/20   Charlesetta Shanks, MD  oxyCODONE-acetaminophen (PERCOCET) 10-325 MG tablet Take 1 tablet by mouth every 6 (six) hours as needed for 5 days. 05/12/22     pantoprazole (PROTONIX) 40 MG tablet Take 1 tablet (40 mg total) by mouth daily. 11/06/21   Mauri Pole, MD  Probiotic Product (PROBIOTIC DAILY PO) Take 1 capsule by mouth daily.    [provider]  promethazine-dextromethorphan (PROMETHAZINE-DM) 6.25-15 MG/5ML syrup Take 5 mLs by mouth 4 (four) times daily as needed. 03/03/22   Hoyt Koch, MD  Risankizumab-rzaa [facility]) 360 MG/2.4ML SOCT Week 12 begin 360 mg SQ injection by cartridge and repeat every 8 weeks 02/08/22   Mauri Pole, MD  sodium chloride (OCEAN) 0.65 % SOLN nasal spray Place 1 spray into both nostrils daily as needed for congestion.    [provider]  VASCEPA 1 g capsule Take 2 capsules by mouth 2 times daily. 12/24/21   Freada Bergeron, MD  Vitamin D3 (VITAMIN D) 25 MCG tablet Take 1,000 Units by mouth at bedtime.    [provider]  EPIPEN 2-PAK 0.3 MG/0.3ML SOAJ injection Inject 0.3 mg into  the muscle as needed for anaphylaxis. Reported on 06/27/2015 10/09/14   [provider]  insulin glargine (LANTUS) 100 UNIT/ML injection Inject 0.48 mLs (48 Units total) into the skin at bedtime. 12/15/20 12/22/20  Hoyt Koch, MD      Allergies    Lipitor [atorvastatin], Remicade [infliximab], Ticagrelor, and Rosuvastatin    Review of Systems   Review of Systems  Cardiovascular:  Positive for chest pain.    Physical Exam Updated Vital Signs BP (!) 150/69 (BP Location: Left Arm)   Pulse 87   Temp 98.8 F (37.1 C)   Resp 16   LMP  (LMP Unknown)   SpO2  97%  Physical Exam  ED Results / Procedures / Treatments   Labs (all labs ordered are listed, but only abnormal results are displayed) Labs Reviewed  BASIC METABOLIC PANEL - Abnormal; Notable for the following components:      Result Value   Glucose, Bld 131 (*)    All other components within normal limits  RESP PANEL BY RT-PCR (RSV, FLU A&B, COVID)  RVPGX2  CBC  TROPONIN I (HIGH SENSITIVITY)  TROPONIN I (HIGH SENSITIVITY)    EKG None  Radiology DG Chest 2 View  Result Date: 06/18/2022 CLINICAL DATA:  Right-sided chest pain EXAM: CHEST - 2 VIEW COMPARISON:  Chest x-ray May 09, 2022 FINDINGS: The cardiomediastinal silhouette is unchanged in contour. Chronic left basilar linear opacity, likely scarring. No acute focal pulmonary opacity. No pleural effusion or pneumothorax. The visualized upper abdomen is unremarkable. No acute osseous abnormality. IMPRESSION: No acute cardiopulmonary abnormality. Electronically Signed   By: Beryle Flock M.D.   On: 06/18/2022 16:33    Procedures Procedures  {Document cardiac monitor, telemetry assessment procedure when appropriate:1}  Medications Ordered in ED Medications - No data to display  ED Course/ Medical Decision Making/ A&P   {   Click here for ABCD2, HEART and other calculatorsREFRESH Note before signing :1}                          Medical Decision  Making  ***  {Document critical care time when appropriate:1} {Document review of labs and clinical decision tools ie heart score, Chads2Vasc2 etc:1}  {Document your independent review of radiology images, and any outside records:1} {Document your discussion with family members, caretakers, and with consultants:1} {Document social determinants of health affecting pt's care:1} {Document your decision making why or why not admission, treatments were needed:1} Final Clinical Impression(s) / ED Diagnoses Final diagnoses:  None    Rx / DC Orders ED Discharge Orders     None

## 2022-06-18 NOTE — ED Triage Notes (Signed)
Pt BIB [facility] coming from PCP. Pt presents with sharp CP under R breast that started last PM. Pain is worse on deep inspiration. Pt does report R arm pain x 2 days, but does not relate it to the CP. Denies ShOB, nausea, or diaphoresis.   EMS Vitals  143/79 HR 80 NSR

## 2022-06-19 NOTE — Discharge Instructions (Signed)
Your workup today was reassuring, no evidence of blood clot in your lungs or heart attack. As we discussed, I would restart your anti-inflammatories on a scheduled basis. Please follow-up with your primary care doctor and/or your cardiologist.  Copies of your labs and imaging on back for their review. Return here for new concerns.

## 2022-06-21 ENCOUNTER — Other Ambulatory Visit: Payer: Self-pay

## 2022-06-21 ENCOUNTER — Telehealth: Payer: Self-pay | Admitting: Gastroenterology

## 2022-06-21 ENCOUNTER — Other Ambulatory Visit (HOSPITAL_COMMUNITY): Payer: Self-pay

## 2022-06-21 MED ORDER — SUCRALFATE 1 G PO TABS
1.0000 g | ORAL_TABLET | Freq: Three times a day (TID) | ORAL | 0 refills | Status: DC
  Filled 2022-06-21: qty 120, 30d supply, fill #0

## 2022-06-21 NOTE — Telephone Encounter (Signed)
Pt states she has been having pain under her right breast when she breathes in. She went to the ER and they found nothing wrong. Pt thought she should check with Dr. Silverio Decamp to see if this might be related to her Crohn's. Please advise.

## 2022-06-21 NOTE — Telephone Encounter (Signed)
Pt aware of recommendations per Dr. Silverio Decamp. Script sent to pharmacy,  pt scheduled to see Dr. Silverio Decamp 06/30/22 at 2:10pm. Pt aware of appt.

## 2022-06-21 NOTE — Telephone Encounter (Signed)
Please see if we can bring her in for a office visit with me or APP soon. Please send Rx for Carafate before meals and at bedtime 1gm as needed X 120 tabs with no refills. Thanks

## 2022-06-21 NOTE — Telephone Encounter (Signed)
Patient is  calling about pains under left ribs. She has crohns and is concerned. Please advise

## 2022-06-22 ENCOUNTER — Telehealth: Payer: Self-pay | Admitting: *Deleted

## 2022-06-22 DIAGNOSIS — M79642 Pain in left hand: Secondary | ICD-10-CM | POA: Diagnosis not present

## 2022-06-22 DIAGNOSIS — M79645 Pain in left finger(s): Secondary | ICD-10-CM | POA: Diagnosis not present

## 2022-06-22 NOTE — Patient Outreach (Signed)
  Care Coordination [facility] Note Transition Care Management Unsuccessful Follow-up Telephone Call  Date of discharge and from where:  ED EMMI [id number] [facility]  Attempts:  1st Attempt  Reason for unsuccessful TCM follow-up call:  Left voice message  [place] Management [phone]

## 2022-06-23 ENCOUNTER — Telehealth: Payer: Self-pay | Admitting: *Deleted

## 2022-06-23 NOTE — Patient Outreach (Signed)
  Care Coordination [facility] Note Transition Care Management Unsuccessful Follow-up Telephone Call  Date of discharge and from where:  [facility] ED [id number] [facility]  Attempts:  2nd Attempt  Reason for unsuccessful TCM follow-up call:  Left voice message  [place] Care Management [phone]

## 2022-06-24 ENCOUNTER — Telehealth: Payer: Self-pay | Admitting: *Deleted

## 2022-06-24 NOTE — Patient Outreach (Signed)
  Care Coordination [facility] Note Transition Care Management Unsuccessful Follow-up Telephone Call  Date of discharge and from where:  [facility] EMMI discharge [id number]  Attempts:  3rd Attempt  Reason for unsuccessful TCM follow-up call:  Left voice message  [place] Management [phone]

## 2022-06-27 DIAGNOSIS — R0981 Nasal congestion: Secondary | ICD-10-CM | POA: Diagnosis not present

## 2022-06-27 DIAGNOSIS — J0100 Acute maxillary sinusitis, unspecified: Secondary | ICD-10-CM | POA: Diagnosis not present

## 2022-06-27 DIAGNOSIS — J209 Acute bronchitis, unspecified: Secondary | ICD-10-CM | POA: Diagnosis not present

## 2022-06-27 DIAGNOSIS — R509 Fever, unspecified: Secondary | ICD-10-CM | POA: Diagnosis not present

## 2022-06-29 ENCOUNTER — Telehealth: Payer: Self-pay | Admitting: Gastroenterology

## 2022-06-29 ENCOUNTER — Other Ambulatory Visit (HOSPITAL_COMMUNITY): Payer: Self-pay

## 2022-06-29 ENCOUNTER — Encounter (HOSPITAL_COMMUNITY): Payer: Self-pay

## 2022-06-29 NOTE — Telephone Encounter (Signed)
Inbound call from patient stating that she needed to cancel her appointment for tomorrow 1/31 at 2:10 with Dr. Silverio Decamp due to having the flu. Patient was advised that Dr. Woodward Ku next appointment would not be until April and that she could schedule with an APP. Patient stated " send a message to the nurse because I seen to be seen soon". Please advise.

## 2022-06-30 ENCOUNTER — Ambulatory Visit: Payer: BC Managed Care – PPO | Admitting: Gastroenterology

## 2022-06-30 NOTE — Telephone Encounter (Signed)
Spoke with the patient. Still very sick with the flu. Moved her appointment to 07/28/22 at 11:20.

## 2022-07-02 ENCOUNTER — Other Ambulatory Visit: Payer: Self-pay

## 2022-07-05 ENCOUNTER — Other Ambulatory Visit (HOSPITAL_COMMUNITY): Payer: Self-pay

## 2022-07-05 ENCOUNTER — Other Ambulatory Visit: Payer: Self-pay

## 2022-07-05 ENCOUNTER — Other Ambulatory Visit: Payer: Self-pay | Admitting: Internal Medicine

## 2022-07-05 DIAGNOSIS — M79642 Pain in left hand: Secondary | ICD-10-CM | POA: Diagnosis not present

## 2022-07-05 MED ORDER — METFORMIN HCL ER 500 MG PO TB24
1000.0000 mg | ORAL_TABLET | Freq: Every day | ORAL | 5 refills | Status: DC
  Filled 2022-07-05: qty 60, 30d supply, fill #0
  Filled 2022-08-02: qty 60, 30d supply, fill #1
  Filled 2022-09-07: qty 60, 30d supply, fill #2
  Filled 2022-10-07: qty 60, 30d supply, fill #3
  Filled 2022-11-02: qty 60, 30d supply, fill #4
  Filled 2022-12-11: qty 60, 30d supply, fill #5

## 2022-07-08 NOTE — Addendum Note (Signed)
Addended by: Rossie Muskrat on: 07/08/2022 04:40 PM   Modules accepted: Orders

## 2022-07-13 ENCOUNTER — Other Ambulatory Visit (HOSPITAL_COMMUNITY): Payer: Self-pay

## 2022-07-14 ENCOUNTER — Other Ambulatory Visit: Payer: Self-pay

## 2022-07-15 DIAGNOSIS — M25641 Stiffness of right hand, not elsewhere classified: Secondary | ICD-10-CM | POA: Diagnosis not present

## 2022-07-28 ENCOUNTER — Other Ambulatory Visit: Payer: Self-pay

## 2022-07-28 ENCOUNTER — Ambulatory Visit: Payer: BC Managed Care – PPO | Admitting: Gastroenterology

## 2022-07-28 ENCOUNTER — Other Ambulatory Visit (INDEPENDENT_AMBULATORY_CARE_PROVIDER_SITE_OTHER): Payer: BC Managed Care – PPO

## 2022-07-28 ENCOUNTER — Encounter: Payer: Self-pay | Admitting: Gastroenterology

## 2022-07-28 VITALS — BP 136/74 | HR 80 | Ht 63.0 in | Wt 138.0 lb

## 2022-07-28 DIAGNOSIS — K5010 Crohn's disease of large intestine without complications: Secondary | ICD-10-CM

## 2022-07-28 DIAGNOSIS — K760 Fatty (change of) liver, not elsewhere classified: Secondary | ICD-10-CM

## 2022-07-28 DIAGNOSIS — E538 Deficiency of other specified B group vitamins: Secondary | ICD-10-CM

## 2022-07-28 LAB — IBC + FERRITIN
Ferritin: 28.5 ng/mL (ref 10.0–291.0)
Iron: 104 ug/dL (ref 42–145)
Saturation Ratios: 21.5 % (ref 20.0–50.0)
TIBC: 484.4 ug/dL — ABNORMAL HIGH (ref 250.0–450.0)
Transferrin: 346 mg/dL (ref 212.0–360.0)

## 2022-07-28 LAB — B12 AND FOLATE PANEL
Folate: >23.9 ng/mL (ref >5.9)
Vitamin B-12: 599 pg/mL (ref 211–911)

## 2022-07-28 LAB — HIGH SENSITIVITY CRP: CRP, High Sensitivity: 4.63 mg/L (ref 0.000–5.000)

## 2022-07-28 LAB — VITAMIN D 25 HYDROXY (VIT D DEFICIENCY, FRACTURES): VITD: 20.75 ng/mL — ABNORMAL LOW (ref 30.00–100.00)

## 2022-07-28 MED ORDER — CYANOCOBALAMIN 1000 MCG/ML IJ SOLN
1000.0000 ug | INTRAMUSCULAR | 11 refills | Status: DC
  Filled 2022-07-28: qty 1, 30d supply, fill #0
  Filled 2022-09-07: qty 1, 30d supply, fill #1
  Filled 2022-10-20: qty 1, 30d supply, fill #2
  Filled 2022-11-24: qty 1, 30d supply, fill #3
  Filled 2022-12-23: qty 1, 30d supply, fill #4

## 2022-07-28 NOTE — Progress Notes (Signed)
[name]    GZ:[id number]    [birth year]  Primary Care Physician:Crawford, Real Cons, MD  Referring Physician: Hoyt Koch, MD [address] [place],  [place] [address]   Chief complaint: Crohn's disease Chief Complaint  Patient presents with   Crohn's Disease    Pt is here for Crohn's follow up visit.    HPI:  62 year old very pleasant female with history of Crohn's disease here for follow-up visit.   I last saw her on 7/ /25/2023. At that time her diarrhea and abdominal pain had resolved. She she is currently on maintenance dose of Skyrizi.   Since last visit, she was seen in the ED on 06/18/22 for sharp, right sided chest pain worsened with deep breathing. Per HPI from this ED visit note: "Hx DES to LAD 2018, does follow with cardiology (Dr. Johney Frame). Quit smoking in 2006. No hx of DVT or PE. No recent travel but did have surgery on left arm just before Christmas, currently still in cast." She had negative tropnonins x2 and her CTA PE was negative for acute findings.   Today, she is following up on Crohn's disease. Patient is concerned to what other medications are available as her insurance is not fully covering her current medication, Skyrizi. She reports typically having at least one BM a day. She had an episode of black stool when she had the flu 4 weeks ago but this has resolved. She has intermittent bloating/gas, occasional lower abdominal pain. She denies blood in stool and diarrhea.   She reports that for the first 4 weeks after Skyrizi injection, she will have loose stools every time she urinates. She denies any watery diarrhea. She occasionally drinks milk.  She reports she last took her vitamin B12 injection in November 2023.  She does not have any refills, has chronic B12 deficiency  She denies having any exercise regimen due to the cold weather but will try to include exercise once the weather is warmer.   She reports that she typically  experience sweating/hot flashes. She reports that her blood sugars are well controlled.   GI Hx  Pathology 09/23/21: 1. Surgical [P], small bowel, terminal ileum MINIMAL ACUTE ILEITIS (SEE MICROSCOPIC COMMENT) 2. Surgical [P], colon, ascending, polyp (1) TUBULAR ADENOMA NEGATIVE FOR HIGH-GRADE DYSPLASIA AND CARCINOMA  Colonoscopy 09/23/21: - One 5 mm polyp in the ascending colon, removed with a cold snare. Resected and retrieved. - Simple Endoscopic Score for Crohn's Disease: 4, mucosal inflammatory changes secondary to Crohn's disease with ileitis. Biopsied. - Diverticulosis in the sigmoid colon. - Non-bleeding external and internal hemorrhoids.  Endoscopy 09/23/21: - The Z-line was regular and was found 35 cm from the incisors. - A small hiatal hernia was present. - The stomach was normal. - The cardia and gastric fundus were normal on retroflexion. - The examined duodenum was normal.  CRP elevated to 55,000 in May 2022 and was persistently elevated on Entyvio and budesonide   Colonoscopy December 05, 2018: Terminal ileal biopsies consistent with Crohn's disease.  IC valve polypoid lesion status post EMR, biopsies consistent with IBD negative for dysplasia or adenomatous tissue 5 mm polyp, tubular adenoma removed from transverse colon otherwise normal colon on chromoendoscopy   Colonoscopy July 07, 2018 Nodular TI biopsied Localized nodular polypoid mucosa in IC valve biopsied, ?  Adenoma on pathology report, requested additional review is pending.   EGD 08/22/2009: Esophagitis otherwise unremarkable exam  MRI pelvis February 14, 2018 showed grade 1 simple linear intersphincteric perianal fistula without associated abscess.  Mild segmental wall thickening and luminal narrowing in the distal ileum suggestive of mild active Crohn's ileitis.  Mild chronic wall thickening in the terminal ileum without active inflammatory changes.   Relevant GI history: Crohn's disease with predominant  small bowel involvement diagnosed in 1991.  Initially was managed with prednisone, had significant side effects.  Subsequently was in clinical remission on 6-MP.  6-MP was discontinued in 2017 due to persistently elevated transaminases, have improved after discontinuing 6-MP.  She was started on Humira, clinically failed with persistent symptoms.  Had undetectable drug trough with elevated antibodies.  Started on budesonide 9 mg daily and taper down to 3 mg daily in 2018.  Developed rectal pain and noted to have perianal fistula on MRI pelvis September 2019.   Relevant other PMH She had NSTEMI in November 2018, status post coronary stent placement (DES)    Current Outpatient Medications:    sucralfate (CARAFATE) 1 g tablet, Take 1 tablet (1 g total) by mouth 4 (four) times daily -  with meals and at bedtime., Disp: 120 tablet, Rfl: 0   acetaminophen (TYLENOL) 650 MG CR tablet, Take 650 mg by mouth every 8 (eight) hours as needed for pain., Disp: , Rfl:    albuterol (PROVENTIL) (2.5 MG/3ML) 0.083% nebulizer solution, Take 2.5 mg by nebulization every 4 (four) hours as needed for wheezing or shortness of breath. , Disp: , Rfl:    albuterol (VENTOLIN HFA) 108 (90 Base) MCG/ACT inhaler, Inhale 1-2 puffs into the lungs every 6 (six) hours as needed for wheezing or shortness of breath., Disp: , Rfl:    aspirin EC 81 MG tablet, Take 81 mg by mouth daily. Swallow whole., Disp: , Rfl:    Blood Glucose Monitoring Suppl (FREESTYLE LITE) w/Device KIT, Use to check blood sugar up to 4 times a day as directed, Disp: 1 kit, Rfl: 0   budesonide (ENTOCORT EC) 3 MG 24 hr capsule, Take 3 capsules (9 mg total) by mouth daily., Disp: 270 capsule, Rfl: 0   busPIRone (BUSPAR) 10 MG tablet, Take 1 tablet (10 mg total) by mouth 2 (two) times daily., Disp: 180 tablet, Rfl: 0   colestipol (COLESTID) 1 g tablet, Take 1 tablet (1 g total) by mouth daily., Disp: 90 tablet, Rfl: 1   Continuous Blood Gluc Sensor (FREESTYLE LIBRE  14 DAY SENSOR) MISC, Use to monitor sugars, Disp: 2 each, Rfl: 11   Continuous Blood Gluc Sensor (FREESTYLE LIBRE SENSOR SYSTEM) MISC, Use for blood sugar monitoring, Disp: 1 each, Rfl: 0   cyanocobalamin (,VITAMIN B-12,) 1000 MCG/ML injection, 1 ML  B12 injection every 2 weeks for 3 months then decrease to 1 B12 injection to once a month, Disp: 1 mL, Rfl: 12   dicyclomine (BENTYL) 20 MG tablet, Take 1 tablet (20 mg total) by mouth every 6 (six) hours., Disp: 60 tablet, Rfl: 0   docusate sodium (COLACE) 100 MG capsule, Take 1 capsule every day by oral route for 10 days., Disp: 10 capsule, Rfl: 0   DULoxetine (CYMBALTA) 60 MG capsule, Take 1 capsule (60 mg total) by mouth daily., Disp: 90 capsule, Rfl: 1   empagliflozin (JARDIANCE) 25 MG TABS tablet, Take 1 tablet (25 mg total) by mouth daily before breakfast., Disp: 30 tablet, Rfl: 3   enalapril (VASOTEC) 10 MG tablet, Take 1 tablet (10 mg total) by mouth daily., Disp: 90 tablet, Rfl: 3   Evolocumab (  REPATHA SURECLICK) [id number] MG/ML SOAJ, Inject 1 Pen into the skin every 14 (fourteen) days., Disp: 2 mL, Rfl: 11   fenofibrate (TRICOR) 145 MG tablet, TAKE ONE TABLET BY MOUTH ONCE DAILY, Disp: 90 tablet, Rfl: 3   furosemide (LASIX) 40 MG tablet, Take 1 tablet (40 mg total) by mouth daily as needed for fluid or edema., Disp: 30 tablet, Rfl: 2   glimepiride (AMARYL) 2 MG tablet, Take 1 tablet (2 mg total) by mouth daily before breakfast., Disp: 30 tablet, Rfl: 3   glucose blood (TRUE METRIX BLOOD GLUCOSE TEST) test strip, Use to check blood sugar up to 4 times a day as directed., Disp: 100 each, Rfl: 3   insulin glargine-yfgn (SEMGLEE, YFGN,) 100 UNIT/ML Pen, Inject 48-55 Units into the skin daily., Disp: 30 mL, Rfl: 3   Insulin Pen Needle (PEN NEEDLES 3/16") 31G X 5 MM MISC, Use daily, Disp: 50 each, Rfl: 11   Lancets (FREESTYLE) lancets, Use to check blood sugar up to 4 times a day as directed, Disp: 100 each, Rfl: 0   levocetirizine (XYZAL) 5 MG tablet,  Take 1 tablet  by mouth every evening., Disp: 90 tablet, Rfl: 0   metFORMIN (GLUCOPHAGE-XR) 500 MG 24 hr tablet, Take 2 tablets by mouth daily., Disp: 60 tablet, Rfl: 5   methocarbamol (ROBAXIN) 500 MG tablet, Take 1 tablet (500 mg total) by mouth 4 (four) times daily as needed for 10 days., Disp: 40 tablet, Rfl: 0   metoprolol succinate (TOPROL-XL) 25 MG 24 hr tablet, Take 1 & 1/2 tablets (37.5 mg total) by mouth daily., Disp: 135 tablet, Rfl: 3   montelukast (SINGULAIR) 10 MG tablet, Take 1 tablet (10 mg total) by mouth every evening., Disp: 90 tablet, Rfl: 3   Multiple Vitamins-Minerals (MULTIVITAMIN WITH MINERALS) tablet, Take 1 tablet by mouth daily., Disp: , Rfl:    nitroGLYCERIN (NITROSTAT) 0.4 MG SL tablet, Place 1 tablet (0.4 mg total) under the tongue every 5 (five) minutes as needed for chest pain.  Call 911 if no relief after 1st dose., Disp: 25 tablet, Rfl: 5   ondansetron (ZOFRAN ODT) 4 MG disintegrating tablet, Take 1 tablet (4 mg total) by mouth every 4 (four) hours as needed for nausea or vomiting., Disp: 20 tablet, Rfl: 0   oxyCODONE-acetaminophen (PERCOCET) 10-325 MG tablet, Take 1 tablet by mouth every 6 (six) hours as needed for 5 days., Disp: 20 tablet, Rfl: 0   pantoprazole (PROTONIX) 40 MG tablet, Take 1 tablet (40 mg total) by mouth daily., Disp: 240 tablet, Rfl: 0   Probiotic Product (PROBIOTIC DAILY PO), Take 1 capsule by mouth daily., Disp: , Rfl:    promethazine-dextromethorphan (PROMETHAZINE-DM) 6.25-15 MG/5ML syrup, Take 5 mLs by mouth 4 (four) times daily as needed., Disp: 118 mL, Rfl: 0   Risankizumab-rzaa (SKYRIZI) 360 MG/2.4ML SOCT, Week 12 begin 360 mg SQ injection by cartridge and repeat every 8 weeks, Disp: 2.4 mL, Rfl: 7   sodium chloride (OCEAN) 0.65 % SOLN nasal spray, Place 1 spray into both nostrils daily as needed for congestion., Disp: , Rfl:    VASCEPA 1 g capsule, Take 2 capsules by mouth 2 times daily., Disp: 360 capsule, Rfl: 3   Vitamin D3 (VITAMIN D)  25 MCG tablet, Take 1,000 Units by mouth at bedtime., Disp: , Rfl:     Allergies as of 07/28/2022 - Review Complete 06/18/2022  Allergen Reaction Noted   Lipitor [atorvastatin] Other (See Comments) 03/28/2013   Remicade [infliximab]     Ticagrelor  09/16/2017   Rosuvastatin Other (See Comments) 01/25/2019    Past Medical History:  Diagnosis Date   Allergic rhinitis due to pollen    Allergy    Anxiety state, unspecified    Arthritis    Bronchitis    hx   CAD (coronary artery disease)    s/p NSTEMI in 11/18 treated with a DES to the proximal LAD   Calculus of gallbladder without mention of cholecystitis or obstruction    Crohn disease ([place])    Depressive disorder, not elsewhere classified    Diabetes mellitus without complication ([place])    diet controlled   Esophageal reflux    Fibromyalgia    Grade I diastolic dysfunction    Noted on ECHO   Headache    History of nuclear stress test    ETT-Myoview 10/17: EF 55%, normal perfusion; Low Risk // Nuclear stress test 05/2018:  EF 63, normal perfusion; Low Risk   Myalgia and myositis, unspecified    NSTEMI (non-ST elevated myocardial infarction) ([place]) 04/15/2017   Other and unspecified hyperlipidemia    Palpitations    Pneumonia 13   hx   S/P angioplasty with stent 04/18/17 with DES to pLAD 04/19/2017   Seizures ([place])    1 seizure as a teenager    Unspecified essential hypertension    Vitamin B deficiency     Past Surgical History:  Procedure Laterality Date   BIOPSY  12/05/2018   Procedure: BIOPSY;  Surgeon: Mauri Pole, MD;  Location: [facility];  Service: Endoscopy;;   CHOLECYSTECTOMY N/A 09/19/2014   Procedure: LAPAROSCOPIC CHOLECYSTECTOMY WITH INTRAOPERATIVE CHOLANGIOGRAM;  Surgeon: Donnie Mesa, MD;  Location: [facility];  Service: General;  Laterality: N/A;   COLONOSCOPY WITH PROPOFOL N/A 12/05/2018   Procedure: COLONOSCOPY WITH PROPOFOL;  Surgeon: Mauri Pole, MD;  Location: [facility];  Service:  Endoscopy;  Laterality: N/A;  chromoendoscopy   CORONARY STENT INTERVENTION N/A 04/18/2017   Procedure: CORONARY STENT INTERVENTION;  Surgeon: Martinique, Peter M, MD;  Location: [facility];  Service: Cardiovascular;  Laterality: N/A;   ESOPHAGOGASTRODUODENOSCOPY     EXPLORATORY LAPAROTOMY     for endometriosis   INTRAVASCULAR PRESSURE WIRE/FFR STUDY N/A 04/18/2017   Procedure: INTRAVASCULAR PRESSURE WIRE/FFR STUDY;  Surgeon: Martinique, Peter M, MD;  Location: [facility];  Service: Cardiovascular;  Laterality: N/A;   LEFT HEART CATH AND CORONARY ANGIOGRAPHY N/A 04/18/2017   Procedure: LEFT HEART CATH AND CORONARY ANGIOGRAPHY;  Surgeon: Martinique, Peter M, MD;  Location: [facility];  Service: Cardiovascular;  Laterality: N/A;   POLYPECTOMY  12/05/2018   Procedure: POLYPECTOMY;  Surgeon: Mauri Pole, MD;  [facility];  Service: Endoscopy;;   SUBMUCOSAL LIFTING INJECTION  12/05/2018   Procedure: SUBMUCOSAL LIFTING INJECTION;  Surgeon: Mauri Pole, MD;  Location: [facility];  Service: Endoscopy;;   TONSILLECTOMY AND ADENOIDECTOMY     TUBAL LIGATION     BILATERAL    Family History  Problem Relation Age of Onset   Lung cancer Maternal Grandmother    Stroke Father    Hypertension Mother    Heart attack Mother    Heart disease Mother    Hypertension Brother    Heart disease Brother    Hypertension Sister    Heart disease Sister    Coronary artery disease Brother    Colon cancer Neg Hx    Stomach cancer Neg Hx    Esophageal cancer Neg Hx    Rectal cancer Neg Hx  Social History   Socioeconomic History   Marital status: Married    Spouse name: Dane   Number of children: 2   Years of education: 12+   Highest education level: Not on file  Occupational History   Occupation: ACCOUNT REP.    Employer: [employer]    Comment: retired  Tobacco Use   Smoking status: Former    Packs/day: 1.00    Years: 35.00    Total pack  years: 35.00    Types: Cigarettes    Quit date: 05/31/2004    Years since quitting: 18.1   Smokeless tobacco: Never  Vaping Use   Vaping Use: Never used  Substance and Sexual Activity   Alcohol use: No   Drug use: No   Sexual activity: Not Currently    Comment: married  Other Topics Concern   Not on file  Social History Narrative   Lives with her husband and their pets. Her children are adults and live independently.   Social Determinants of Health   Financial Resource Strain: Not on file  Food Insecurity: No Food Insecurity (04/06/2022)   Hunger Vital Sign    Worried About Running Out of Food in the Last Year: Never true    Ran Out of Food in the Last Year: Never true  Transportation Needs: No Transportation Needs (04/06/2022)   PRAPARE - Hydrologist (Medical): No    Lack of Transportation (Non-Medical): No  Physical Activity: Not on file  Stress: Not on file  Social Connections: Not on file  Intimate Partner Violence: Not on file      Review of systems: Review of Systems  Constitutional:  Positive for diaphoresis.  Gastrointestinal:  Positive for abdominal distention and abdominal pain. Negative for blood in stool and diarrhea.       +gas  Endocrine:       + hot flashes      Physical Exam: General: well-appearing  Eyes: sclera anicteric, no redness ENT: oral mucosa moist without lesions, no cervical or supraclavicular lymphadenopathy CV: RRR, no JVD, no peripheral edema Resp: clear to auscultation bilaterally, normal RR and effort noted GI: soft, LLQ tenderness, with active bowel sounds. No guarding or palpable organomegaly noted. Abdominal distention. Tympanic  Skin; warm and dry, no rash or jaundice noted Neuro: awake, alert and oriented x 3. Normal gross motor function and fluent speech   Data Reviewed:  Reviewed labs, radiology imaging, old records and pertinent past GI work up   Assessment and  Plan/Recommendations:  62 year old very pleasant female with history of Crohn's disease, diabetes and CAD here for follow up visit, currently in clinical remission on [place].    She had clinically failed anti-TNF and Entyvio in the past  She had significant elevation of A1c with budesonide, A1c has since improved since she is off steroids  Patient is concerned about high out-of-pocket expense with Orson Ape, she did have evidence of active ileitis on most recent colonoscopy in April 2023 Advised patient to discuss with her investor options for drug coverage/discounts so she continues the maintenance dose  Elevated transaminases secondary to steatohepatitis, continue to monitor Continue with low-carb and low-fat diet.  Continue daily exercise   Vitamin B12 deficiency: Continue B12 maintenance injections- provided with refill today  IBD health maintenance: check on CRP, B12 and folate, vitamin D, IBC + ferritin, TB gold today.   Return in 3 months    This visit required 40 minutes of patient care (this includes  precharting, chart review, review of results, face-to-face time used for counseling as well as treatment plan and follow-up. The patient was provided an opportunity to ask questions and all were answered. The patient agreed with the plan and demonstrated an understanding of the instructions.    I,Safa M Kadhim,acting as a scribe for Harl Bowie, MD.,have documented all relevant documentation on the behalf of Harl Bowie, MD,as directed by  Harl Bowie, MD while in the presence of Harl Bowie, MD.   I, Harl Bowie, MD, have reviewed all documentation for this visit. The documentation on 07/28/22 for the exam, diagnosis, procedures, and orders are all accurate and complete.   Damaris Hippo , MD    CC: Hoyt Koch, *

## 2022-07-28 NOTE — Patient Instructions (Addendum)
Your provider has requested that you go to the basement level for lab work before leaving today. Press "B" on the elevator. The lab is located at the first door on the left as you exit the elevator.   Continue Skyrizi  Due to recent changes in healthcare laws, you may see the results of your imaging and laboratory studies on MyChart before your provider has had a chance to review them.  We understand that in some cases there may be results that are confusing or concerning to you. Not all laboratory results come back in the same time frame and the provider may be waiting for multiple results in order to interpret others.  Please give [place] 48 hours in order for your provider to thoroughly review all the results before contacting the office for clarification of your results.    _______________________________________________________  If your blood pressure at your visit was 140/90 or greater, please contact your primary care physician to follow up on this.  _______________________________________________________  If you are age 64 or older, your body mass index should be between 23-30. Your Body mass index is 24.45 kg/m. If this is out of the aforementioned range listed, please consider follow up with your Primary Care Provider.  If you are age 65 or younger, your body mass index should be between 19-25. Your Body mass index is 24.45 kg/m. If this is out of the aformentioned range listed, please consider follow up with your Primary Care Provider.   ________________________________________________________  The [facility] providers would like to encourage you to use [facility] to communicate with providers for non-urgent requests or questions.  Due to long hold times on the telephone, sending your provider a message by [facility] may be a faster and more efficient way to get a response.  Please allow 48 business hours for a response.  Please remember that this is for non-urgent requests.   _______________________________________________________   I appreciate the  opportunity to care for you  Thank You   Harl Bowie , MD

## 2022-07-30 LAB — QUANTIFERON-TB GOLD PLUS
Mitogen-NIL: 7.93 IU/mL
NIL: 0.25 IU/mL
QuantiFERON-TB Gold Plus: NEGATIVE
TB1-NIL: <0 IU/mL
TB2-NIL: 0.08 IU/mL

## 2022-08-02 ENCOUNTER — Other Ambulatory Visit (HOSPITAL_COMMUNITY): Payer: Self-pay

## 2022-08-02 ENCOUNTER — Other Ambulatory Visit: Payer: Self-pay | Admitting: Internal Medicine

## 2022-08-02 ENCOUNTER — Other Ambulatory Visit: Payer: Self-pay | Admitting: Gastroenterology

## 2022-08-02 ENCOUNTER — Other Ambulatory Visit: Payer: Self-pay

## 2022-08-02 MED ORDER — LEVOCETIRIZINE DIHYDROCHLORIDE 5 MG PO TABS
5.0000 mg | ORAL_TABLET | Freq: Every evening | ORAL | 0 refills | Status: DC
  Filled 2022-08-02: qty 30, 30d supply, fill #0
  Filled 2022-09-07: qty 30, 30d supply, fill #1
  Filled 2022-10-07: qty 30, 30d supply, fill #2

## 2022-08-02 MED ORDER — PANTOPRAZOLE SODIUM 40 MG PO TBEC
40.0000 mg | DELAYED_RELEASE_TABLET | Freq: Every day | ORAL | 0 refills | Status: DC
  Filled 2022-08-02: qty 30, 30d supply, fill #0
  Filled 2022-09-07: qty 30, 30d supply, fill #1
  Filled 2022-10-07: qty 30, 30d supply, fill #2
  Filled 2022-11-02: qty 30, 30d supply, fill #3
  Filled 2022-12-11: qty 30, 30d supply, fill #4

## 2022-08-05 DIAGNOSIS — M13842 Other specified arthritis, left hand: Secondary | ICD-10-CM | POA: Diagnosis not present

## 2022-08-05 DIAGNOSIS — M25642 Stiffness of left hand, not elsewhere classified: Secondary | ICD-10-CM | POA: Diagnosis not present

## 2022-08-10 DIAGNOSIS — Z1231 Encounter for screening mammogram for malignant neoplasm of breast: Secondary | ICD-10-CM | POA: Diagnosis not present

## 2022-08-10 DIAGNOSIS — Z01419 Encounter for gynecological examination (general) (routine) without abnormal findings: Secondary | ICD-10-CM | POA: Diagnosis not present

## 2022-08-10 DIAGNOSIS — Z124 Encounter for screening for malignant neoplasm of cervix: Secondary | ICD-10-CM | POA: Diagnosis not present

## 2022-08-10 DIAGNOSIS — Z6824 Body mass index (BMI) 24.0-24.9, adult: Secondary | ICD-10-CM | POA: Diagnosis not present

## 2022-08-10 DIAGNOSIS — M25642 Stiffness of left hand, not elsewhere classified: Secondary | ICD-10-CM | POA: Diagnosis not present

## 2022-08-16 ENCOUNTER — Other Ambulatory Visit: Payer: Self-pay

## 2022-08-16 ENCOUNTER — Other Ambulatory Visit (HOSPITAL_COMMUNITY): Payer: Self-pay

## 2022-08-16 MED ORDER — ERGOCALCIFEROL 1.25 MG (50000 UT) PO CAPS
50000.0000 [IU] | ORAL_CAPSULE | ORAL | 0 refills | Status: DC
  Filled 2022-08-16: qty 4, 28d supply, fill #0
  Filled 2022-11-24: qty 4, 28d supply, fill #1

## 2022-08-17 DIAGNOSIS — M25642 Stiffness of left hand, not elsewhere classified: Secondary | ICD-10-CM | POA: Diagnosis not present

## 2022-08-23 ENCOUNTER — Other Ambulatory Visit (HOSPITAL_COMMUNITY): Payer: Self-pay

## 2022-08-23 ENCOUNTER — Other Ambulatory Visit: Payer: Self-pay | Admitting: Internal Medicine

## 2022-08-23 ENCOUNTER — Telehealth: Payer: Self-pay | Admitting: Gastroenterology

## 2022-08-23 ENCOUNTER — Other Ambulatory Visit: Payer: Self-pay

## 2022-08-23 MED ORDER — DULOXETINE HCL 60 MG PO CPEP
60.0000 mg | ORAL_CAPSULE | Freq: Every day | ORAL | 0 refills | Status: DC
  Filled 2022-08-23: qty 30, 30d supply, fill #0

## 2022-08-23 NOTE — Telephone Encounter (Signed)
Patient reports the Orson Ape which is due to be injected next week will cost her $9000.00 which she is unable to afford.  Her last shipment was in February. Does it need a new prior authorization?

## 2022-08-23 NOTE — Telephone Encounter (Signed)
PT is calling to advise [place] that she first took her Skyrizi on 2/12 and it is due again 4/8

## 2022-08-24 ENCOUNTER — Other Ambulatory Visit: Payer: Self-pay

## 2022-08-24 ENCOUNTER — Other Ambulatory Visit (HOSPITAL_COMMUNITY): Payer: Self-pay

## 2022-08-24 NOTE — Telephone Encounter (Signed)
Called [employer], Sept PA is still on file- expires 02/03/2023.  Called [facility], rep confirmed patient's high copay, and stated Feb copay was $6000.00. Rep stated patient has copay assistance on file, but patient may have maxed out the card.  Received number to call copay card- Phone# [phone].  Rep advised patient would need to contact [employer] copay support to see about next steps are after maxing out her copay card- phone# [phone]  Attempted to call patient, no answer and voicemail is not set up.

## 2022-08-25 NOTE — Telephone Encounter (Signed)
Called the patient. No answer. No voicemail. 

## 2022-08-25 NOTE — Telephone Encounter (Signed)
Attempted to contact the patient. No answer. No voicemail at [phone].

## 2022-08-26 NOTE — Telephone Encounter (Signed)
No answer. No voicemail. 

## 2022-08-26 NOTE — Telephone Encounter (Signed)
Spoke with the patient. Information shared with her.

## 2022-08-30 ENCOUNTER — Telehealth: Payer: Self-pay

## 2022-08-30 NOTE — Telephone Encounter (Signed)
Patient has sent her forms for the [employer] patient access support for signature by the provider. Forms completed and put on Dr Woodward Ku desk for signing.

## 2022-08-31 DIAGNOSIS — M25641 Stiffness of right hand, not elsewhere classified: Secondary | ICD-10-CM | POA: Diagnosis not present

## 2022-09-07 ENCOUNTER — Other Ambulatory Visit (HOSPITAL_COMMUNITY): Payer: Self-pay

## 2022-09-08 NOTE — Telephone Encounter (Signed)
Patient Access Support forms faxed.

## 2022-09-16 DIAGNOSIS — M13842 Other specified arthritis, left hand: Secondary | ICD-10-CM | POA: Diagnosis not present

## 2022-09-16 NOTE — Telephone Encounter (Signed)
Called the patient. Status update of Skyrizi PAP requested.

## 2022-09-20 ENCOUNTER — Other Ambulatory Visit (HOSPITAL_COMMUNITY): Payer: Self-pay

## 2022-09-20 ENCOUNTER — Other Ambulatory Visit: Payer: Self-pay

## 2022-09-20 ENCOUNTER — Other Ambulatory Visit: Payer: Self-pay | Admitting: Internal Medicine

## 2022-09-20 MED ORDER — INSULIN GLARGINE-YFGN 100 UNIT/ML ~~LOC~~ SOPN
48.0000 [IU] | PEN_INJECTOR | Freq: Every day | SUBCUTANEOUS | 0 refills | Status: DC
  Filled 2022-09-20: qty 15, 27d supply, fill #0
  Filled 2022-10-20: qty 15, 27d supply, fill #1

## 2022-09-20 MED ORDER — DULOXETINE HCL 60 MG PO CPEP
60.0000 mg | ORAL_CAPSULE | Freq: Every day | ORAL | 0 refills | Status: DC
  Filled 2022-09-20: qty 30, 30d supply, fill #0

## 2022-09-21 ENCOUNTER — Other Ambulatory Visit (HOSPITAL_COMMUNITY): Payer: Self-pay

## 2022-09-29 NOTE — Telephone Encounter (Signed)
Spoke with the patient. She has not heard from the patient assistance progrtam for [employer]. She does not know the phone number for her nurse or for the program. Gave her the phone number for the patient assistance and instructed her to call them.

## 2022-09-29 NOTE — Telephone Encounter (Signed)
Patient called requesting a call back to discuss her medication Skyrizi. Please advise, thank you.

## 2022-10-07 ENCOUNTER — Other Ambulatory Visit (HOSPITAL_COMMUNITY): Payer: Self-pay

## 2022-10-07 ENCOUNTER — Other Ambulatory Visit: Payer: Self-pay | Admitting: Internal Medicine

## 2022-10-08 ENCOUNTER — Other Ambulatory Visit (HOSPITAL_COMMUNITY): Payer: Self-pay

## 2022-10-08 MED ORDER — GLIMEPIRIDE 2 MG PO TABS
2.0000 mg | ORAL_TABLET | Freq: Every day | ORAL | 3 refills | Status: DC
  Filled 2022-10-08: qty 30, 30d supply, fill #0
  Filled 2022-11-02: qty 30, 30d supply, fill #1
  Filled 2022-12-11: qty 30, 30d supply, fill #2

## 2022-10-08 MED ORDER — EMPAGLIFLOZIN 25 MG PO TABS
25.0000 mg | ORAL_TABLET | Freq: Every day | ORAL | 3 refills | Status: DC
  Filled 2022-10-08: qty 30, 30d supply, fill #0

## 2022-10-20 ENCOUNTER — Other Ambulatory Visit (HOSPITAL_COMMUNITY): Payer: Self-pay

## 2022-10-20 ENCOUNTER — Other Ambulatory Visit: Payer: Self-pay

## 2022-10-20 ENCOUNTER — Other Ambulatory Visit: Payer: Self-pay | Admitting: Internal Medicine

## 2022-10-20 MED ORDER — DULOXETINE HCL 60 MG PO CPEP
60.0000 mg | ORAL_CAPSULE | Freq: Every day | ORAL | 0 refills | Status: DC
  Filled 2022-10-20: qty 30, 30d supply, fill #0

## 2022-10-20 MED ORDER — LEVOCETIRIZINE DIHYDROCHLORIDE 5 MG PO TABS
5.0000 mg | ORAL_TABLET | Freq: Every evening | ORAL | 0 refills | Status: DC
  Filled 2022-10-20 – 2022-11-02 (×2): qty 30, 30d supply, fill #0
  Filled 2022-12-11: qty 30, 30d supply, fill #1

## 2022-10-21 ENCOUNTER — Telehealth: Payer: Self-pay | Admitting: Gastroenterology

## 2022-10-21 NOTE — Telephone Encounter (Signed)
Noted Veterinary surgeon

## 2022-10-21 NOTE — Telephone Encounter (Signed)
Patient call wanting to inform that Cristy Folks has contacted her and will be sending her medication. States it is okay to call if anything needs to be discussed further.

## 2022-10-27 ENCOUNTER — Ambulatory Visit: Payer: BC Managed Care – PPO | Admitting: Gastroenterology

## 2022-11-02 ENCOUNTER — Other Ambulatory Visit (HOSPITAL_COMMUNITY): Payer: Self-pay

## 2022-11-02 ENCOUNTER — Other Ambulatory Visit: Payer: Self-pay

## 2022-11-03 ENCOUNTER — Other Ambulatory Visit (HOSPITAL_COMMUNITY): Payer: Self-pay

## 2022-11-11 ENCOUNTER — Other Ambulatory Visit (HOSPITAL_COMMUNITY): Payer: Self-pay

## 2022-11-11 DIAGNOSIS — M1711 Unilateral primary osteoarthritis, right knee: Secondary | ICD-10-CM | POA: Diagnosis not present

## 2022-11-11 MED ORDER — PREDNISONE 10 MG PO TABS
ORAL_TABLET | ORAL | 0 refills | Status: DC
  Filled 2022-11-11 (×2): qty 42, 12d supply, fill #0

## 2022-11-12 ENCOUNTER — Other Ambulatory Visit: Payer: Self-pay

## 2022-11-12 ENCOUNTER — Encounter: Payer: Self-pay | Admitting: Pharmacist

## 2022-11-23 DIAGNOSIS — M25531 Pain in right wrist: Secondary | ICD-10-CM | POA: Diagnosis not present

## 2022-11-24 ENCOUNTER — Other Ambulatory Visit: Payer: Self-pay | Admitting: Internal Medicine

## 2022-11-24 ENCOUNTER — Other Ambulatory Visit (HOSPITAL_COMMUNITY): Payer: Self-pay

## 2022-11-24 ENCOUNTER — Other Ambulatory Visit: Payer: Self-pay

## 2022-11-24 ENCOUNTER — Other Ambulatory Visit: Payer: Self-pay | Admitting: Gastroenterology

## 2022-11-24 MED ORDER — COLESTIPOL HCL 1 G PO TABS
1.0000 g | ORAL_TABLET | Freq: Every day | ORAL | 1 refills | Status: DC
  Filled 2022-11-24: qty 30, 30d supply, fill #0
  Filled 2022-12-23: qty 30, 30d supply, fill #1

## 2022-11-24 MED ORDER — DULOXETINE HCL 60 MG PO CPEP
60.0000 mg | ORAL_CAPSULE | Freq: Every day | ORAL | 0 refills | Status: DC
  Filled 2022-11-24: qty 30, 30d supply, fill #0

## 2022-11-24 MED ORDER — SUCRALFATE 1 G PO TABS
1.0000 g | ORAL_TABLET | Freq: Three times a day (TID) | ORAL | 0 refills | Status: AC
  Filled 2022-11-24: qty 120, 30d supply, fill #0

## 2022-11-24 MED ORDER — INSULIN GLARGINE-YFGN 100 UNIT/ML ~~LOC~~ SOPN
48.0000 [IU] | PEN_INJECTOR | Freq: Every day | SUBCUTANEOUS | 0 refills | Status: DC
  Filled 2022-11-24: qty 15, 27d supply, fill #0
  Filled 2022-12-23: qty 15, 27d supply, fill #1

## 2022-12-11 ENCOUNTER — Other Ambulatory Visit: Payer: Self-pay

## 2022-12-11 ENCOUNTER — Other Ambulatory Visit (HOSPITAL_COMMUNITY): Payer: Self-pay

## 2022-12-13 ENCOUNTER — Other Ambulatory Visit: Payer: Self-pay

## 2022-12-13 ENCOUNTER — Other Ambulatory Visit (HOSPITAL_COMMUNITY): Payer: Self-pay

## 2022-12-13 DIAGNOSIS — I25118 Atherosclerotic heart disease of native coronary artery with other forms of angina pectoris: Secondary | ICD-10-CM

## 2022-12-13 MED ORDER — METOPROLOL SUCCINATE ER 25 MG PO TB24
37.5000 mg | ORAL_TABLET | Freq: Every day | ORAL | 4 refills | Status: DC
Start: 2022-12-13 — End: 2022-12-29
  Filled 2022-12-13: qty 45, 30d supply, fill #0

## 2022-12-23 ENCOUNTER — Other Ambulatory Visit: Payer: Self-pay

## 2022-12-23 ENCOUNTER — Other Ambulatory Visit (HOSPITAL_COMMUNITY): Payer: Self-pay

## 2022-12-23 MED ORDER — ENALAPRIL MALEATE 10 MG PO TABS
10.0000 mg | ORAL_TABLET | Freq: Every day | ORAL | 1 refills | Status: DC
  Filled 2022-12-23: qty 30, 30d supply, fill #0

## 2022-12-23 MED ORDER — FENOFIBRATE 145 MG PO TABS
145.0000 mg | ORAL_TABLET | Freq: Every day | ORAL | 1 refills | Status: DC
  Filled 2022-12-23: qty 30, 30d supply, fill #0

## 2022-12-23 NOTE — Addendum Note (Signed)
Addended by: Margaret Pyle D on: 12/23/2022 02:18 PM   Modules accepted: Orders

## 2022-12-28 ENCOUNTER — Other Ambulatory Visit (HOSPITAL_COMMUNITY): Payer: Self-pay

## 2022-12-28 ENCOUNTER — Other Ambulatory Visit: Payer: Self-pay

## 2022-12-28 ENCOUNTER — Other Ambulatory Visit: Payer: Self-pay | Admitting: Internal Medicine

## 2022-12-28 ENCOUNTER — Telehealth: Payer: Self-pay | Admitting: Gastroenterology

## 2022-12-28 ENCOUNTER — Telehealth: Payer: Self-pay | Admitting: Cardiology

## 2022-12-28 DIAGNOSIS — I25118 Atherosclerotic heart disease of native coronary artery with other forms of angina pectoris: Secondary | ICD-10-CM

## 2022-12-28 MED ORDER — VASCEPA 1 G PO CAPS: 2.0000 g | ORAL_CAPSULE | Freq: Two times a day (BID) | ORAL | 3 refills | Status: DC

## 2022-12-28 NOTE — Telephone Encounter (Signed)
PT is switching pharmacies and needs to have all [facility]. Please call [phone] opt 2 or 3 for fax

## 2022-12-28 NOTE — Telephone Encounter (Signed)
Pt would like to speak with someone about getting all of her prescriptions sent over to [facility]. Please advise.

## 2022-12-28 NOTE — Telephone Encounter (Signed)
[name] that mail order pharmacy is listed in patient preferred pharmacy.

## 2022-12-28 NOTE — Telephone Encounter (Signed)
Please call patient concerning transferring her medication:  [phone]

## 2022-12-28 NOTE — Telephone Encounter (Signed)
Attempted to contact patient to see which medication she needs refilled. Left message to call office back.

## 2022-12-29 ENCOUNTER — Other Ambulatory Visit (HOSPITAL_COMMUNITY): Payer: Self-pay

## 2022-12-29 MED ORDER — NITROGLYCERIN 0.4 MG SL SUBL: 0.4000 mg | SUBLINGUAL_TABLET | SUBLINGUAL | 5 refills | Status: DC | PRN

## 2022-12-29 MED ORDER — ENALAPRIL MALEATE 10 MG PO TABS: 10.0000 mg | ORAL_TABLET | Freq: Every day | ORAL | 1 refills | Status: DC

## 2022-12-29 MED ORDER — VASCEPA 1 G PO CAPS: 2.0000 g | ORAL_CAPSULE | Freq: Two times a day (BID) | ORAL | 1 refills | Status: DC

## 2022-12-29 MED ORDER — FUROSEMIDE 40 MG PO TABS: 40.0000 mg | ORAL_TABLET | Freq: Every day | ORAL | 1 refills | Status: AC | PRN

## 2022-12-29 MED ORDER — REPATHA SURECLICK 140 MG/ML ~~LOC~~ SOAJ
140.0000 mg | SUBCUTANEOUS | 3 refills | Status: DC
  Filled 2022-12-29: qty 2, 28d supply, fill #0

## 2022-12-29 MED ORDER — METOPROLOL SUCCINATE ER 25 MG PO TB24
37.5000 mg | ORAL_TABLET | Freq: Every day | ORAL | 1 refills | Status: DC
Start: 2022-12-29 — End: 2023-08-18

## 2022-12-29 MED ORDER — FENOFIBRATE 145 MG PO TABS: 145.0000 mg | ORAL_TABLET | Freq: Every day | ORAL | 1 refills | Status: DC

## 2022-12-29 NOTE — Telephone Encounter (Signed)
Pt's needs medication resent to [facility] mail order pharmacy. Please address

## 2022-12-29 NOTE — Telephone Encounter (Signed)
Repatha needs to stay with local pharmacy for the $5 copay card to work. Mail order pharmacies don't accept copay cards so copay will be higher. Pt aware, refill sent in to [facility].

## 2022-12-29 NOTE — Telephone Encounter (Signed)
New Message:    Patient says she wants all her medicine transferred to [id number].

## 2022-12-30 MED ORDER — BUDESONIDE 3 MG PO CPEP: 9.0000 mg | ORAL_CAPSULE | Freq: Every day | ORAL | 3 refills | Status: AC

## 2022-12-30 MED ORDER — COLESTIPOL HCL 1 G PO TABS: 1.0000 g | ORAL_TABLET | Freq: Every day | ORAL | 3 refills | Status: DC

## 2022-12-30 MED ORDER — DULOXETINE HCL 60 MG PO CPEP: 60.0000 mg | ORAL_CAPSULE | Freq: Every day | ORAL | 3 refills | Status: DC

## 2022-12-30 MED ORDER — CYANOCOBALAMIN 1000 MCG/ML IJ SOLN: 1000.0000 ug | INTRAMUSCULAR | 11 refills | Status: DC

## 2022-12-30 MED ORDER — DULOXETINE HCL 30 MG PO CPEP: 30.0000 mg | ORAL_CAPSULE | Freq: Every day | ORAL | 3 refills | Status: DC

## 2022-12-30 MED ORDER — PANTOPRAZOLE SODIUM 40 MG PO TBEC: 40.0000 mg | DELAYED_RELEASE_TABLET | Freq: Every day | ORAL | 3 refills | Status: DC

## 2022-12-30 NOTE — Telephone Encounter (Signed)
Inbound call from patient requesting a call regarding previous note. Also requesting to speak with [facility] regarding medications. Please advise, thank you.

## 2022-12-30 NOTE — Telephone Encounter (Signed)
Returned patients call to go over meds   She needs Budesonide,Colestid,Vitamin B12 and Pantoprazole to [facility]  Dr Lavon Paganini, Patient wants to know if you could please give her a temporary prescription until she can get to her PCP of Duloxetine 60 mg for once daily. Her PCP will not give her a refill until she is seen and she is out of her medication and is having severe anxiety and Depression. Crying while on the phone...Marland KitchenMarland KitchenMarland Kitchen She wants [facility] [place] if you approve the temporary supply, she is scheduled for a follow up with you October 22 nd.  Thanks

## 2022-12-30 NOTE — Telephone Encounter (Signed)
Okay to send refills for all her prescription until she comes in for her follow-up visit.  I am fine with sending refill for duloxetine until she can be seen by her PCP but she will need to follow-up with PCP for ongoing refills

## 2022-12-30 NOTE — Telephone Encounter (Signed)
Called patient and informed her that Cymbalta 60 mg was sent for once daily.

## 2023-01-07 ENCOUNTER — Other Ambulatory Visit (HOSPITAL_COMMUNITY): Payer: Self-pay

## 2023-01-10 ENCOUNTER — Ambulatory Visit (INDEPENDENT_AMBULATORY_CARE_PROVIDER_SITE_OTHER): Payer: BC Managed Care – PPO | Admitting: Internal Medicine

## 2023-01-10 VITALS — BP 134/66 | HR 79 | Temp 98.0°F | Ht 63.0 in | Wt 135.2 lb

## 2023-01-10 DIAGNOSIS — E1169 Type 2 diabetes mellitus with other specified complication: Secondary | ICD-10-CM

## 2023-01-10 DIAGNOSIS — I700 Atherosclerosis of aorta: Secondary | ICD-10-CM

## 2023-01-10 DIAGNOSIS — Z794 Long term (current) use of insulin: Secondary | ICD-10-CM | POA: Diagnosis not present

## 2023-01-10 DIAGNOSIS — K50813 Crohn's disease of both small and large intestine with fistula: Secondary | ICD-10-CM | POA: Diagnosis not present

## 2023-01-10 DIAGNOSIS — E118 Type 2 diabetes mellitus with unspecified complications: Secondary | ICD-10-CM | POA: Diagnosis not present

## 2023-01-10 DIAGNOSIS — F331 Major depressive disorder, recurrent, moderate: Secondary | ICD-10-CM

## 2023-01-10 DIAGNOSIS — E785 Hyperlipidemia, unspecified: Secondary | ICD-10-CM

## 2023-01-10 DIAGNOSIS — T466X5A Adverse effect of antihyperlipidemic and antiarteriosclerotic drugs, initial encounter: Secondary | ICD-10-CM

## 2023-01-10 DIAGNOSIS — G720 Drug-induced myopathy: Secondary | ICD-10-CM

## 2023-01-10 DIAGNOSIS — Z0000 Encounter for general adult medical examination without abnormal findings: Secondary | ICD-10-CM

## 2023-01-10 LAB — POCT GLYCOSYLATED HEMOGLOBIN (HGB A1C): Hemoglobin A1C: 8.3 % — AB (ref 4.0–5.6)

## 2023-01-10 MED ORDER — DULOXETINE HCL 60 MG PO CPEP: 60.0000 mg | ORAL_CAPSULE | Freq: Every day | ORAL | 3 refills | Status: DC

## 2023-01-10 MED ORDER — MONTELUKAST SODIUM 10 MG PO TABS: 10.0000 mg | ORAL_TABLET | Freq: Every evening | ORAL | 3 refills | Status: DC

## 2023-01-10 MED ORDER — GLIMEPIRIDE 2 MG PO TABS: 2.0000 mg | ORAL_TABLET | Freq: Every day | ORAL | 3 refills | Status: DC

## 2023-01-10 MED ORDER — EMPAGLIFLOZIN 25 MG PO TABS: 25.0000 mg | ORAL_TABLET | Freq: Every day | ORAL | 3 refills | Status: DC

## 2023-01-10 MED ORDER — LEVOCETIRIZINE DIHYDROCHLORIDE 5 MG PO TABS: 5.0000 mg | ORAL_TABLET | Freq: Every evening | ORAL | 3 refills | Status: DC

## 2023-01-10 MED ORDER — INSULIN GLARGINE-YFGN 100 UNIT/ML ~~LOC~~ SOPN: 58.0000 [IU] | PEN_INJECTOR | Freq: Every day | SUBCUTANEOUS | 3 refills | Status: DC

## 2023-01-10 MED ORDER — METFORMIN HCL ER 500 MG PO TB24: 1000.0000 mg | ORAL_TABLET | Freq: Every day | ORAL | 3 refills | Status: DC

## 2023-01-10 NOTE — Progress Notes (Unsigned)
   Subjective:   Patient ID: [name], female    DOB: [birth year], 62 y.o.   MRN: [id number]  Medication Refill Associated symptoms include arthralgias. Pertinent negatives include no abdominal pain, chest pain, coughing, nausea or vomiting.   The patient is here for physical.  PMH, FMH, social history reviewed and updated  Review of Systems  Constitutional: Negative.   HENT: Negative.    Eyes: Negative.   Respiratory:  Negative for cough, chest tightness and shortness of breath.   Cardiovascular:  Negative for chest pain, palpitations and leg swelling.  Gastrointestinal:  Negative for abdominal distention, abdominal pain, constipation, diarrhea, nausea and vomiting.  Musculoskeletal:  Positive for arthralgias.  Skin: Negative.   Neurological: Negative.   Psychiatric/Behavioral: Negative.      Objective:  Physical Exam Constitutional:      Appearance: She is well-developed.  HENT:     Head: Normocephalic and atraumatic.  Cardiovascular:     Rate and Rhythm: Normal rate and regular rhythm.  Pulmonary:     Effort: Pulmonary effort is normal. No respiratory distress.     Breath sounds: Normal breath sounds. No wheezing or rales.  Abdominal:     General: Bowel sounds are normal. There is no distension.     Palpations: Abdomen is soft.     Tenderness: There is no abdominal tenderness. There is no rebound.  Musculoskeletal:        General: Tenderness present.     Cervical back: Normal range of motion.  Skin:    General: Skin is warm and dry.  Neurological:     Mental Status: She is alert and oriented to person, place, and time.     Coordination: Coordination normal.     Vitals:   01/10/23 1316  BP: 134/66  Pulse: 79  Temp: 98 F (36.7 C)  TempSrc: Oral  SpO2: 95%  Weight: 135 lb 4 oz (61.3 kg)  Height: 5\' 3"  (1.6 m)    Assessment & Plan:

## 2023-01-11 ENCOUNTER — Telehealth: Payer: Self-pay | Admitting: Gastroenterology

## 2023-01-11 MED ORDER — "PEN NEEDLES 3/16"" 31G X 5 MM MISC": 11 refills | Status: DC

## 2023-01-11 NOTE — Telephone Encounter (Signed)
Patient called requesting a prescription for needles for her B12 shots be called into [facility].  Thank you.

## 2023-01-11 NOTE — Telephone Encounter (Signed)
B 12 injections prescribed by Dr Lavon Paganini

## 2023-01-11 NOTE — Telephone Encounter (Signed)
B12 needles sent to patients mail order pharmacy as requested

## 2023-01-12 ENCOUNTER — Encounter: Payer: Self-pay | Admitting: Internal Medicine

## 2023-01-12 DIAGNOSIS — Z0000 Encounter for general adult medical examination without abnormal findings: Secondary | ICD-10-CM | POA: Insufficient documentation

## 2023-01-12 NOTE — Assessment & Plan Note (Signed)
On repatha.  Continue.  

## 2023-01-12 NOTE — Assessment & Plan Note (Signed)
Doing well with cymbalta 60 mg daily. Continue.

## 2023-01-12 NOTE — Assessment & Plan Note (Signed)
Seeing GI and overall better control on skyrizi.

## 2023-01-12 NOTE — Assessment & Plan Note (Addendum)
POC HgA1c done and improving. Still not at goal. She is off steroids now which has helped immensely. Taking jardiance 25 mg daily and glimepiride 2 mg daily and semglee 58 units daily and metformin. No low sugars. Reminded about eye exam and she will schedule this.

## 2023-01-12 NOTE — Assessment & Plan Note (Signed)
Flu shot yearly. Shingrix declines. Tetanus up to date. Colonoscopy up to date. Mammogram due, pap smear up to date. Counseled about sun safety and mole surveillance. Counseled about the dangers of distracted driving. Given 10 year screening recommendations.

## 2023-01-12 NOTE — Assessment & Plan Note (Signed)
Taking repatha and will continue.

## 2023-01-12 NOTE — Assessment & Plan Note (Signed)
Checking lipid panel and adjust as needed repatha.

## 2023-01-19 ENCOUNTER — Other Ambulatory Visit (HOSPITAL_COMMUNITY): Payer: Self-pay

## 2023-01-27 ENCOUNTER — Other Ambulatory Visit: Payer: Self-pay

## 2023-02-01 ENCOUNTER — Other Ambulatory Visit (HOSPITAL_COMMUNITY): Payer: Self-pay

## 2023-02-02 ENCOUNTER — Telehealth: Payer: Self-pay | Admitting: Pharmacy Technician

## 2023-02-02 ENCOUNTER — Other Ambulatory Visit (HOSPITAL_COMMUNITY): Payer: Self-pay

## 2023-02-02 NOTE — Telephone Encounter (Signed)
Pharmacy Patient Advocate Encounter   Received notification from CoverMyMeds that prior authorization for SKYRIZI 360MG  is required/requested.   Insurance verification completed.   The patient is insured through [facility] .   Per test claim: PA required; PA submitted to [employer] via CoverMyMeds Key/confirmation #/EOC [id number] Status is pending

## 2023-02-02 NOTE — Telephone Encounter (Signed)
Pharmacy Patient Advocate Encounter  Received notification from [facility] that Prior Authorization for SKYRIZI 360MG  has been CANCELLED due to PATIENT DOES NOT HAVE PHARMACY BENEFITS.   PA #/Case ID/Reference #: [id number]

## 2023-02-03 NOTE — Telephone Encounter (Signed)
Inbound call from [facility] stating patient insurance has changed to [employer]. States Skyrizi medication would have to be sent through [employer]	[place] due to [facility] not being covered with [employer]. Please advise, thank you.

## 2023-02-03 NOTE — Telephone Encounter (Signed)
Called the patient. No answer. Left her a message asking she get a copy of her insurance card to [place] asap.

## 2023-02-09 NOTE — Telephone Encounter (Signed)
Called the patient. No answer. Left her a message to bring [place] the new insurance card, or upload it to [place] through My Chart. Explained that we need to get the Cristy Folks approved through her new insurance asap.

## 2023-02-10 ENCOUNTER — Other Ambulatory Visit: Payer: Self-pay

## 2023-02-10 MED ORDER — SKYRIZI 360 MG/2.4ML ~~LOC~~ SOCT: SUBCUTANEOUS | 7 refills | Status: DC

## 2023-02-10 NOTE — Telephone Encounter (Signed)
Patient is not returning my call. Prescription sent to the [facility] pharmacy for [place].

## 2023-03-01 NOTE — Telephone Encounter (Signed)
Patient stopped by office to give new insurance information. I have scanned the paperwork she received from [employer] until she gets her physical insurance card. Insurance comes back as verified.

## 2023-03-09 ENCOUNTER — Other Ambulatory Visit (HOSPITAL_COMMUNITY): Payer: Self-pay

## 2023-03-09 ENCOUNTER — Telehealth: Payer: Self-pay | Admitting: Pharmacy Technician

## 2023-03-09 NOTE — Telephone Encounter (Signed)
Pharmacy Patient Advocate Encounter   Received notification from CoverMyMeds that prior authorization for SKYRIZI 360MG  is required/requested.   Insurance verification completed.   The patient is insured through [employer] .   Per test claim: PA required; PA submitted to [employer] via CoverMyMeds Key/confirmation #/EOC [id number] Status is pending

## 2023-03-10 ENCOUNTER — Other Ambulatory Visit (HOSPITAL_COMMUNITY): Payer: Self-pay

## 2023-03-10 NOTE — Telephone Encounter (Signed)
Pharmacy Patient Advocate Encounter  Received notification from [employer] that Prior Authorization for SKYRIZ 360MG  has been APPROVED from 10.1.24 to 12.31.24   PA #/Case ID/Reference #:

## 2023-03-15 NOTE — Telephone Encounter (Signed)
Inbound call from patient requesting a call from beth to discuss Skyrzi.Patient stated she came in and gave her new insurance info and wants to make sure everything is good. Please advise.

## 2023-03-17 ENCOUNTER — Other Ambulatory Visit: Payer: Self-pay

## 2023-03-17 ENCOUNTER — Telehealth: Payer: Self-pay

## 2023-03-17 MED ORDER — SKYRIZI 360 MG/2.4ML ~~LOC~~ SOCT: SUBCUTANEOUS | 7 refills | Status: DC

## 2023-03-17 NOTE — Addendum Note (Signed)
Addended by: Heber [place] A on: 03/17/2023 04:10 PM   Modules accepted: Orders

## 2023-03-17 NOTE — Telephone Encounter (Signed)
Yes and I will submit it to our pharmacy team for prior authorization. Will she need to repeat the induction?

## 2023-03-17 NOTE — Telephone Encounter (Signed)
Maintenance dose is every 12 weeks, per patient its been 2 months since last dose , if we can get approval within next 4 weeks she will not need re induction. Thanks

## 2023-03-17 NOTE — Telephone Encounter (Signed)
Does she have any insurance coverage now? She needs to go back on Skyrizi to prevent acute flare of Crohn's disease

## 2023-03-17 NOTE — Telephone Encounter (Signed)
Patient has been off of Skyrizi for at least 2 months. She does not remember when she last took it. She lost her insurance, BC[employer]. She is having normal bowel movements.  Please advise.

## 2023-03-17 NOTE — Telephone Encounter (Signed)
Sklyrizi is approved until 05/31/2023. Prescription sent to [facility].

## 2023-03-18 NOTE — Progress Notes (Signed)
[name]    [id number]    [birth year]  Primary Care Physician:Crawford, Austin Miles, MD  Referring Physician: Myrlene Broker, MD [address] [place],  [place] [address]   Chief complaint: Crohn's disease No chief complaint on file.   HPI:  62 year old very pleasant female with history of Crohn's disease here for follow-up visit.   Last seen on 07-28-22.  Today,    GI Hx  Pathology 09/23/21: 1. Surgical [P], small bowel, terminal ileum MINIMAL ACUTE ILEITIS (SEE MICROSCOPIC COMMENT) 2. Surgical [P], colon, ascending, polyp (1) TUBULAR ADENOMA NEGATIVE FOR HIGH-GRADE DYSPLASIA AND CARCINOMA  Colonoscopy 09/23/21: - One 5 mm polyp in the ascending colon, removed with a cold snare. Resected and retrieved. - Simple Endoscopic Score for Crohn's Disease: 4, mucosal inflammatory changes secondary to Crohn's disease with ileitis. Biopsied. - Diverticulosis in the sigmoid colon. - Non-bleeding external and internal hemorrhoids.  Endoscopy 09/23/21: - The Z-line was regular and was found 35 cm from the incisors. - A small hiatal hernia was present. - The stomach was normal. - The cardia and gastric fundus were normal on retroflexion. - The examined duodenum was normal.  CRP elevated to 55,000 in May 2022 and was persistently elevated on Entyvio and budesonide   Colonoscopy December 05, 2018: Terminal ileal biopsies consistent with Crohn's disease.  IC valve polypoid lesion status post EMR, biopsies consistent with IBD negative for dysplasia or adenomatous tissue 5 mm polyp, tubular adenoma removed from transverse colon otherwise normal colon on chromoendoscopy   Colonoscopy July 07, 2018 Nodular TI biopsied Localized nodular polypoid mucosa in IC valve biopsied, ?  Adenoma on pathology report, requested additional review is pending.   EGD 08/22/2009: Esophagitis otherwise unremarkable exam      MRI pelvis February 14, 2018 showed grade 1 simple linear  intersphincteric perianal fistula without associated abscess.  Mild segmental wall thickening and luminal narrowing in the distal ileum suggestive of mild active Crohn's ileitis.  Mild chronic wall thickening in the terminal ileum without active inflammatory changes.   Relevant GI history: Crohn's disease with predominant small bowel involvement diagnosed in 1991.  Initially was managed with prednisone, had significant side effects.  Subsequently was in clinical remission on 6-MP.  6-MP was discontinued in 2017 due to persistently elevated transaminases, have improved after discontinuing 6-MP.  She was started on Humira, clinically failed with persistent symptoms.  Had undetectable drug trough with elevated antibodies.  Started on budesonide 9 mg daily and taper down to 3 mg daily in 2018.  Developed rectal pain and noted to have perianal fistula on MRI pelvis September 2019.   Relevant other PMH She had NSTEMI in November 2018, status post coronary stent placement (DES)    Current Outpatient Medications:    acetaminophen (TYLENOL) 650 MG CR tablet, Take 650 mg by mouth every 8 (eight) hours as needed for pain., Disp: , Rfl:    albuterol (PROVENTIL) (2.5 MG/3ML) 0.083% nebulizer solution, Take 2.5 mg by nebulization every 4 (four) hours as needed for wheezing or shortness of breath. , Disp: , Rfl:    albuterol (VENTOLIN HFA) 108 (90 Base) MCG/ACT inhaler, Inhale 1-2 puffs into the lungs every 6 (six) hours as needed for wheezing or shortness of breath., Disp: , Rfl:    aspirin EC 81 MG tablet, Take 81 mg by mouth daily. Swallow whole., Disp: , Rfl:    Blood Glucose Monitoring Suppl (FREESTYLE LITE) w/Device KIT, Use to  check blood sugar up to 4 times a day as directed, Disp: 1 kit, Rfl: 0   budesonide (ENTOCORT EC) 3 MG 24 hr capsule, Take 3 capsules (9 mg total) by mouth daily., Disp: 270 capsule, Rfl: 3   busPIRone (BUSPAR) 10 MG tablet, Take 1 tablet (10 mg total) by mouth 2 (two) times daily.  (Patient not taking: Reported on 01/10/2023), Disp: 180 tablet, Rfl: 0   colestipol (COLESTID) 1 g tablet, Take 1 tablet (1 g total) by mouth daily., Disp: 90 tablet, Rfl: 3   Continuous Blood Gluc Sensor (FREESTYLE LIBRE 14 DAY SENSOR) MISC, Use to monitor sugars, Disp: 2 each, Rfl: 11   Continuous Blood Gluc Sensor (FREESTYLE LIBRE SENSOR SYSTEM) MISC, Use for blood sugar monitoring, Disp: 1 each, Rfl: 0   cyanocobalamin (VITAMIN B12) 1000 MCG/ML injection, Inject 1 mL (1,000 mcg total) into the muscle every 30 (thirty) days., Disp: 1 mL, Rfl: 11   dicyclomine (BENTYL) 20 MG tablet, Take 1 tablet (20 mg total) by mouth every 6 (six) hours. (Patient not taking: Reported on 01/10/2023), Disp: 60 tablet, Rfl: 0   DULoxetine (CYMBALTA) 60 MG capsule, Take 1 capsule (60 mg total) by mouth daily., Disp: 90 capsule, Rfl: 3   empagliflozin (JARDIANCE) 25 MG TABS tablet, Take 1 tablet (25 mg total) by mouth daily before breakfast., Disp: 90 tablet, Rfl: 3   enalapril (VASOTEC) 10 MG tablet, Take 1 tablet (10 mg) by mouth daily., Disp: 90 tablet, Rfl: 1   ergocalciferol (VITAMIN D2) 1.25 MG (50000 UT) capsule, Take 1 capsule (50,000 Units total) by mouth once a week. THEN BEGIN OTC 2000 IU DAILY, Disp: 8 capsule, Rfl: 0   Evolocumab (REPATHA SURECLICK) 140 MG/ML SOAJ, Inject 140 mg into the skin every 14 (fourteen) days., Disp: 6 mL, Rfl: 3   fenofibrate (TRICOR) 145 MG tablet, TAKE ONE TABLET BY MOUTH ONCE DAILY, Disp: 90 tablet, Rfl: 1   furosemide (LASIX) 40 MG tablet, Take 1 tablet (40 mg total) by mouth daily as needed for fluid or edema. (Patient not taking: Reported on 01/10/2023), Disp: 90 tablet, Rfl: 1   glimepiride (AMARYL) 2 MG tablet, Take 1 tablet (2 mg total) by mouth daily before breakfast., Disp: 90 tablet, Rfl: 3   glucose blood (TRUE METRIX BLOOD GLUCOSE TEST) test strip, Use to check blood sugar up to 4 times a day as directed., Disp: 100 each, Rfl: 3   insulin glargine-yfgn (SEMGLEE, YFGN,)  100 UNIT/ML Pen, Inject 58 Units into the skin daily., Disp: 60 mL, Rfl: 3   Insulin Pen Needle (PEN NEEDLES 3/16") 31G X 5 MM MISC, Use daily, Disp: 50 each, Rfl: 11   Lancets (FREESTYLE) lancets, Use to check blood sugar up to 4 times a day as directed, Disp: 100 each, Rfl: 0   levocetirizine (XYZAL) 5 MG tablet, Take 1 tablet  by mouth every evening., Disp: 90 tablet, Rfl: 3   metFORMIN (GLUCOPHAGE-XR) 500 MG 24 hr tablet, Take 2 tablets by mouth daily., Disp: 180 tablet, Rfl: 3   metoprolol succinate (TOPROL-XL) 25 MG 24 hr tablet, Take 1.5 tablets (37.5 mg total) by mouth daily., Disp: 135 tablet, Rfl: 1   montelukast (SINGULAIR) 10 MG tablet, Take 1 tablet (10 mg total) by mouth every evening., Disp: 90 tablet, Rfl: 3   Multiple Vitamins-Minerals (MULTIVITAMIN WITH MINERALS) tablet, Take 1 tablet by mouth daily., Disp: , Rfl:    nitroGLYCERIN (NITROSTAT) 0.4 MG SL tablet, Place 1 tablet (0.4 mg total) under the tongue  every 5 (five) minutes as needed for chest pain.  Call 911 if no relief after 1st dose., Disp: 25 tablet, Rfl: 5   ondansetron (ZOFRAN ODT) 4 MG disintegrating tablet, Take 1 tablet (4 mg total) by mouth every 4 (four) hours as needed for nausea or vomiting., Disp: 20 tablet, Rfl: 0   pantoprazole (PROTONIX) 40 MG tablet, Take 1 tablet (40 mg total) by mouth daily., Disp: 90 tablet, Rfl: 3   Probiotic Product (PROBIOTIC DAILY PO), Take 1 capsule by mouth daily., Disp: , Rfl:    SKYRIZI 360 MG/2.4ML SOCT, 360 mg SQ injection by cartridge and repeat every 8 weeks, Disp: 2.4 mL, Rfl: 7   sodium chloride (OCEAN) 0.65 % SOLN nasal spray, Place 1 spray into both nostrils daily as needed for congestion., Disp: , Rfl:    sucralfate (CARAFATE) 1 g tablet, Take 1 tablet (1 g total) by mouth 4 (four) times daily -  with meals and at bedtime., Disp: 120 tablet, Rfl: 0   VASCEPA 1 g capsule, Take 2 capsules by mouth 2 times daily., Disp: 360 capsule, Rfl: 1   Vitamin D3 (VITAMIN D) 25 MCG  tablet, Take 1,000 Units by mouth at bedtime., Disp: , Rfl:     Allergies as of 03/22/2023 - Review Complete 01/12/2023  Allergen Reaction Noted   Lipitor [atorvastatin] Other (See Comments) 03/28/2013   Remicade [infliximab]     Ticagrelor  09/16/2017   Rosuvastatin Other (See Comments) 01/25/2019    Past Medical History:  Diagnosis Date   Allergic rhinitis due to pollen    Allergy    Anxiety state, unspecified    Arthritis    Bronchitis    hx   CAD (coronary artery disease)    s/p NSTEMI in 11/18 treated with a DES to the proximal LAD   Calculus of gallbladder without mention of cholecystitis or obstruction    Crohn disease (HCC)    Depressive disorder, not elsewhere classified    Diabetes mellitus without complication (HCC)    diet controlled   Esophageal reflux    Fibromyalgia    Grade I diastolic dysfunction    Noted on ECHO   Headache    History of nuclear stress test    ETT-Myoview 10/17: EF 55%, normal perfusion; Low Risk // Nuclear stress test 05/2018:  EF 63, normal perfusion; Low Risk   Myalgia and myositis, unspecified    NSTEMI (non-ST elevated myocardial infarction) (HCC) 04/15/2017   Other and unspecified hyperlipidemia    Palpitations    Pneumonia 13   hx   S/P angioplasty with stent 04/18/17 with DES to pLAD 04/19/2017   Seizures (HCC)    1 seizure as a teenager    Unspecified essential hypertension    Vitamin B deficiency     Past Surgical History:  Procedure Laterality Date   BIOPSY  12/05/2018   Procedure: BIOPSY;  Surgeon: Napoleon Form, MD;  Location: [facility];  Service: Endoscopy;;   CHOLECYSTECTOMY N/A 09/19/2014   Procedure: LAPAROSCOPIC CHOLECYSTECTOMY WITH INTRAOPERATIVE CHOLANGIOGRAM;  Surgeon: Manus Rudd, MD;  Location: [facility] OR;  Service: General;  Laterality: N/A;   COLONOSCOPY WITH PROPOFOL N/A 12/05/2018   Procedure: COLONOSCOPY WITH PROPOFOL;  Surgeon: Napoleon Form, MD;  Location: [facility];  Service: Endoscopy;   Laterality: N/A;  chromoendoscopy   CORONARY PRESSURE/FFR STUDY N/A 04/18/2017   Procedure: INTRAVASCULAR PRESSURE WIRE/FFR STUDY;  Surgeon: Swaziland, Peter M, MD;  Location: [facility] INVASIVE CV LAB;  Service: Cardiovascular;  Laterality: N/A;  CORONARY STENT INTERVENTION N/A 04/18/2017   Procedure: CORONARY STENT INTERVENTION;  Surgeon: Swaziland, Peter M, MD;  Location: [facility] INVASIVE CV LAB;  Service: Cardiovascular;  Laterality: N/A;   ESOPHAGOGASTRODUODENOSCOPY     EXPLORATORY LAPAROTOMY     for endometriosis   LEFT HEART CATH AND CORONARY ANGIOGRAPHY N/A 04/18/2017   Procedure: LEFT HEART CATH AND CORONARY ANGIOGRAPHY;  Surgeon: Swaziland, Peter M, MD;  Location: [facility] INVASIVE CV LAB;  Service: Cardiovascular;  Laterality: N/A;   POLYPECTOMY  12/05/2018   Procedure: POLYPECTOMY;  Surgeon: Napoleon Form, MD;  Location: [facility];  Service: Endoscopy;;   SUBMUCOSAL LIFTING INJECTION  12/05/2018   Procedure: SUBMUCOSAL LIFTING INJECTION;  Surgeon: Napoleon Form, MD;  Location: [facility];  Service: Endoscopy;;   TONSILLECTOMY AND ADENOIDECTOMY     TUBAL LIGATION     BILATERAL    Family History  Problem Relation Age of Onset   Lung cancer Maternal Grandmother    Stroke Father    Hypertension Mother    Heart attack Mother    Heart disease Mother    Hypertension Brother    Heart disease Brother    Hypertension Sister    Heart disease Sister    Coronary artery disease Brother    Colon cancer Neg Hx    Stomach cancer Neg Hx    Esophageal cancer Neg Hx    Rectal cancer Neg Hx     Social History   Socioeconomic History   Marital status: Married    Spouse name: Dane   Number of children: 2   Years of education: 12+   Highest education level: Not on file  Occupational History   Occupation: ACCOUNT REP.    Employer: [employer]    Comment: retired  Tobacco Use   Smoking status: Former    Current packs/day: 0.00    Average packs/day: 1 pack/day for 35.0 years  (35.0 ttl pk-yrs)    Types: Cigarettes    Start date: 05/31/1969    Quit date: 05/31/2004    Years since quitting: 18.8   Smokeless tobacco: Never  Vaping Use   Vaping status: Never Used  Substance and Sexual Activity   Alcohol use: No   Drug use: No   Sexual activity: Not Currently    Comment: married  Other Topics Concern   Not on file  Social History Narrative   Lives with her husband and their pets. Her children are adults and live independently.   Social Determinants of Health   Financial Resource Strain: Not on file  Food Insecurity: No Food Insecurity (04/06/2022)   Hunger Vital Sign    Worried About Running Out of Food in the Last Year: Never true    Ran Out of Food in the Last Year: Never true  Transportation Needs: No Transportation Needs (04/06/2022)   PRAPARE - Administrator, Civil Service (Medical): No    Lack of Transportation (Non-Medical): No  Physical Activity: Not on file  Stress: Not on file  Social Connections: Not on file  Intimate Partner Violence: Not on file      Review of systems: Review of Systems    Physical Exam: General: well-appearing ***  Eyes: sclera anicteric, no redness ENT: oral mucosa moist without lesions, no cervical or supraclavicular lymphadenopathy CV: RRR, no JVD, no peripheral edema Resp: clear to auscultation bilaterally, normal RR and effort noted GI: soft, no tenderness, with active bowel sounds. No guarding or palpable organomegaly noted. Skin; warm and dry,  no rash or jaundice noted Neuro: awake, alert and oriented x 3. Normal gross motor function and fluent speech   Data Reviewed:  Reviewed labs, radiology imaging, old records and pertinent past GI work up   Assessment and Plan/Recommendations:  62 year old very pleasant female with history of Crohn's disease, diabetes and CAD here for follow up visit, currently in clinical remission on [place].    She had clinically failed anti-TNF and Entyvio in the  past  She had significant elevation of A1c with budesonide, A1c has since improved since she is off steroids  Patient is concerned about high out-of-pocket expense with Cristy Folks, she did have evidence of active ileitis on most recent colonoscopy in April 2023 Advised patient to discuss with her investor options for drug coverage/discounts so she continues the maintenance dose  Elevated transaminases secondary to steatohepatitis, continue to monitor Continue with low-carb and low-fat diet.  Continue daily exercise   Vitamin B12 deficiency: Continue B12 maintenance injections- provided with refill today  IBD health maintenance: check on CRP, B12 and folate, vitamin D, IBC + ferritin, TB gold today.   Return in 3 months    This visit required 40 minutes of patient care (this includes precharting, chart review, review of results, face-to-face time used for counseling as well as treatment plan and follow-up. The patient was provided an opportunity to ask questions and all were answered. The patient agreed with the plan and demonstrated an understanding of the instructions.    I,Safa M Kadhim,acting as a scribe for Marsa Aris, MD.,have documented all relevant documentation on the behalf of Marsa Aris, MD,as directed by  Marsa Aris, MD while in the presence of Marsa Aris, MD.   I, Marsa Aris, MD, have reviewed all documentation for this visit. The documentation on 03/18/23 for the exam, diagnosis, procedures, and orders are all accurate and complete.   Iona Beard , MD    CC: Myrlene Broker, *

## 2023-03-22 ENCOUNTER — Other Ambulatory Visit (INDEPENDENT_AMBULATORY_CARE_PROVIDER_SITE_OTHER): Payer: Medicare HMO

## 2023-03-22 ENCOUNTER — Encounter: Payer: Self-pay | Admitting: Gastroenterology

## 2023-03-22 ENCOUNTER — Ambulatory Visit: Payer: Medicare HMO | Admitting: Gastroenterology

## 2023-03-22 VITALS — BP 110/66 | HR 75 | Wt 136.0 lb

## 2023-03-22 DIAGNOSIS — K50019 Crohn's disease of small intestine with unspecified complications: Secondary | ICD-10-CM | POA: Diagnosis not present

## 2023-03-22 DIAGNOSIS — M545 Low back pain, unspecified: Secondary | ICD-10-CM | POA: Diagnosis not present

## 2023-03-22 DIAGNOSIS — D509 Iron deficiency anemia, unspecified: Secondary | ICD-10-CM

## 2023-03-22 DIAGNOSIS — R110 Nausea: Secondary | ICD-10-CM | POA: Diagnosis not present

## 2023-03-22 DIAGNOSIS — K5080 Crohn's disease of both small and large intestine without complications: Secondary | ICD-10-CM

## 2023-03-22 DIAGNOSIS — E559 Vitamin D deficiency, unspecified: Secondary | ICD-10-CM

## 2023-03-22 DIAGNOSIS — R197 Diarrhea, unspecified: Secondary | ICD-10-CM

## 2023-03-22 DIAGNOSIS — D519 Vitamin B12 deficiency anemia, unspecified: Secondary | ICD-10-CM

## 2023-03-22 DIAGNOSIS — N390 Urinary tract infection, site not specified: Secondary | ICD-10-CM

## 2023-03-22 LAB — IBC + FERRITIN
Ferritin: 83.7 ng/mL (ref 10.0–291.0)
Iron: 147 ug/dL — ABNORMAL HIGH (ref 42–145)
Saturation Ratios: 33 % (ref 20.0–50.0)
TIBC: 445.2 ug/dL (ref 250.0–450.0)
Transferrin: 318 mg/dL (ref 212.0–360.0)

## 2023-03-22 LAB — URINALYSIS, ROUTINE W REFLEX MICROSCOPIC
Bilirubin Urine: NEGATIVE
Hgb urine dipstick: NEGATIVE
Ketones, ur: NEGATIVE
Leukocytes,Ua: NEGATIVE
Nitrite: NEGATIVE
RBC / HPF: NONE SEEN (ref 0–?)
Specific Gravity, Urine: 1.025 (ref 1.000–1.030)
Total Protein, Urine: NEGATIVE
Urine Glucose: >=1000 — AB
Urobilinogen, UA: 0.2 (ref 0.0–1.0)
pH: 6 (ref 5.0–8.0)

## 2023-03-22 LAB — CBC WITH DIFFERENTIAL/PLATELET
Basophils Absolute: 0.1 10*3/uL (ref 0.0–0.1)
Basophils Relative: 0.7 % (ref 0.0–3.0)
Eosinophils Absolute: 0.2 10*3/uL (ref 0.0–0.7)
Eosinophils Relative: 2.2 % (ref 0.0–5.0)
HCT: 44.1 % (ref 36.0–46.0)
Hemoglobin: 14.4 g/dL (ref 12.0–15.0)
Lymphocytes Relative: 31.1 % (ref 12.0–46.0)
Lymphs Abs: 2.4 10*3/uL (ref 0.7–4.0)
MCHC: 32.5 g/dL (ref 30.0–36.0)
MCV: 92.6 fL (ref 78.0–100.0)
Monocytes Absolute: 0.5 10*3/uL (ref 0.1–1.0)
Monocytes Relative: 7.1 % (ref 3.0–12.0)
Neutro Abs: 4.6 10*3/uL (ref 1.4–7.7)
Neutrophils Relative %: 58.9 % (ref 43.0–77.0)
Platelets: 292 10*3/uL (ref 150.0–400.0)
RBC: 4.76 Mil/uL (ref 3.87–5.11)
RDW: 13.7 % (ref 11.5–15.5)
WBC: 7.7 10*3/uL (ref 4.0–10.5)

## 2023-03-22 LAB — HIGH SENSITIVITY CRP: CRP, High Sensitivity: 12.92 mg/L — ABNORMAL HIGH (ref 0.000–5.000)

## 2023-03-22 LAB — COMPREHENSIVE METABOLIC PANEL
ALT: 67 U/L — ABNORMAL HIGH (ref 0–35)
AST: 56 U/L — ABNORMAL HIGH (ref 0–37)
Albumin: 4.1 g/dL (ref 3.5–5.2)
Alkaline Phosphatase: 125 U/L — ABNORMAL HIGH (ref 39–117)
BUN: 18 mg/dL (ref 6–23)
CO2: 27 meq/L (ref 19–32)
Calcium: 9.2 mg/dL (ref 8.4–10.5)
Chloride: 103 meq/L (ref 96–112)
Creatinine, Ser: 0.63 mg/dL (ref 0.40–1.20)
GFR: 93.52 mL/min (ref >60.00)
Glucose, Bld: 264 mg/dL — ABNORMAL HIGH (ref 70–99)
Potassium: 4.7 meq/L (ref 3.5–5.1)
Sodium: 140 meq/L (ref 135–145)
Total Bilirubin: 0.6 mg/dL (ref 0.2–1.2)
Total Protein: 6.6 g/dL (ref 6.0–8.3)

## 2023-03-22 LAB — SEDIMENTATION RATE: Sed Rate: 7 mm/h (ref 0–30)

## 2023-03-22 NOTE — Patient Instructions (Addendum)
VISIT SUMMARY:  Dear [name], during our recent visit, we discussed your inflammatory bowel disease, back pain, and diabetes. You've been experiencing back pain and nausea, especially in the mornings, but your bowel habits are regular and there's no blood in your stool. You recently had a systemic illness with fever, body aches, and chills, which improved with antibiotics. However, you suspect a urinary tract or kidney infection due to persistent back pain. Your Skyrizi treatment for inflammatory bowel disease has been interrupted due to insurance issues, but you're eager to resume it as you found it beneficial. Your diabetes is currently stable.  YOUR PLAN:  -INFLAMMATORY BOWEL DISEASE: Inflammatory bowel disease is a term for two conditions (Crohn's disease and ulcerative colitis) that are characterized by chronic inflammation of the gastrointestinal (GI) tract. We plan to resume your [facility] treatment as soon as it arrives. We'll also check comprehensive blood work including kidney and liver function, inflammatory markers, blood count, iron level, B12, vitamin D, and TB test. We're planning for a colonoscopy later this year or early next year to assess disease control.  -BACK PAIN: Back pain can have many causes, and in your case, it might be related to a recent suspected urinary tract infection. We'll check a urine analysis to rule out a lingering urinary infection.  -DIABETES: Diabetes is a chronic disease that affects how your body turns food into energy. There are no acute issues discussed during our visit, so you should continue your current management.  INSTRUCTIONS:  Please return for a follow-up in 3-4 months. In the meantime, if you have any new symptoms or concerns, don't hesitate to contact the office.  Your provider has requested that you go to the basement level for lab work before leaving today. Press "B" on the elevator. The lab is located at the first door on the left as you exit  the elevator.   I appreciate the  opportunity to care for you  Thank You   Marsa Aris , MD

## 2023-03-23 LAB — B12 AND FOLATE PANEL
Folate: >24.2 ng/mL (ref >5.9)
Vitamin B-12: 392 pg/mL (ref 211–911)

## 2023-03-23 LAB — VITAMIN D 25 HYDROXY (VIT D DEFICIENCY, FRACTURES): VITD: 24.57 ng/mL — ABNORMAL LOW (ref 30.00–100.00)

## 2023-03-25 ENCOUNTER — Telehealth: Payer: Self-pay | Admitting: Gastroenterology

## 2023-03-25 LAB — QUANTIFERON-TB GOLD PLUS
Mitogen-NIL: >10 [IU]/mL
NIL: 0.05 [IU]/mL
QuantiFERON-TB Gold Plus: NEGATIVE
TB1-NIL: <0 [IU]/mL
TB2-NIL: 0 [IU]/mL

## 2023-03-25 NOTE — Telephone Encounter (Signed)
Inbound call from patient requesting to discuss recent lab results. Advised patient they have not yet been reviewed. Patient states she still is having a lot of pain in her back. Requesting a call back. Please advise, thank you.

## 2023-03-31 NOTE — Telephone Encounter (Signed)
Called the patient and left message to return call if still having any issues.

## 2023-03-31 NOTE — Telephone Encounter (Signed)
Spoke with the patient. She is feeling well. She is scheduled to receive shipment of Skyrizi on 04/07/23. She understands to take the injection as soon as she gets it. Call me with any problems or concerns.

## 2023-04-05 NOTE — Telephone Encounter (Signed)
Inbound call from [facility], states insurance does not have an approval on file and that patient has an upcoming delivery. Please advise.

## 2023-04-05 NOTE — Telephone Encounter (Signed)
Patient contacted. She wants to try for the patient assistance on Skyrizi. Form printed and left at the front desk on the 3rd floor for her to pick up.

## 2023-04-05 NOTE — Telephone Encounter (Signed)
Spoke with [facility]. They were running the prescription through on her old insurance. Updated the [employer] member identification number. Prior authorization received. The co-pay is $3,324.00. Patient notified. She will need to accept the amount of the co-pay, contact her nurse ambassador or apply for patient assistance. She can apply by written application or she can create an online account at [employer] for patients and apply through that portal.

## 2023-04-07 ENCOUNTER — Telehealth: Payer: Self-pay | Admitting: Gastroenterology

## 2023-04-07 NOTE — Telephone Encounter (Deleted)
Inbound call from [facility] stating a prior Berkley Harvey is needed for [employer]. Requesting a call at [phone] regarding additional information that is needed for prior auth. Fax number is also [phone]. Patient's number to provide is [phone]. Please advise, thank you.

## 2023-04-07 NOTE — Telephone Encounter (Signed)
Inbound call from [facility] prior auth for [employer]. Requesting a call at [phone] regarding additional information that is needed for prior auth. Fax number is also [phone]. Patient's number to provide is [phone]. Please advise, thank you.

## 2023-04-07 NOTE — Telephone Encounter (Signed)
Error

## 2023-04-08 NOTE — Telephone Encounter (Signed)
Inbound call from [facility], [phone], following up on call below.

## 2023-04-11 NOTE — Telephone Encounter (Signed)
Inbound call from [facility] from [facility] requesting at call at [phone] ext [phone] regarding additional information for approval of Skyrizi. Please advise, thank you.

## 2023-04-12 ENCOUNTER — Ambulatory Visit: Payer: Medicare HMO | Admitting: Emergency Medicine

## 2023-04-19 ENCOUNTER — Encounter: Payer: Self-pay | Admitting: Internal Medicine

## 2023-04-19 ENCOUNTER — Ambulatory Visit (INDEPENDENT_AMBULATORY_CARE_PROVIDER_SITE_OTHER): Payer: Medicare HMO | Admitting: Internal Medicine

## 2023-04-19 VITALS — BP 124/80 | HR 67 | Temp 98.6°F | Ht 63.0 in | Wt 137.0 lb

## 2023-04-19 DIAGNOSIS — R5382 Chronic fatigue, unspecified: Secondary | ICD-10-CM

## 2023-04-19 DIAGNOSIS — K50813 Crohn's disease of both small and large intestine with fistula: Secondary | ICD-10-CM | POA: Diagnosis not present

## 2023-04-19 DIAGNOSIS — E785 Hyperlipidemia, unspecified: Secondary | ICD-10-CM

## 2023-04-19 DIAGNOSIS — E1169 Type 2 diabetes mellitus with other specified complication: Secondary | ICD-10-CM | POA: Diagnosis not present

## 2023-04-19 DIAGNOSIS — Z7984 Long term (current) use of oral hypoglycemic drugs: Secondary | ICD-10-CM

## 2023-04-19 MED ORDER — ERGOCALCIFEROL 1.25 MG (50000 UT) PO CAPS: 50000.0000 [IU] | ORAL_CAPSULE | ORAL | 0 refills | Status: DC

## 2023-04-19 MED ORDER — PREDNISONE 20 MG PO TABS: 40.0000 mg | ORAL_TABLET | Freq: Every day | ORAL | 0 refills | Status: AC

## 2023-04-19 MED ORDER — REPATHA SURECLICK 140 MG/ML ~~LOC~~ SOAJ: 140.0000 mg | SUBCUTANEOUS | 3 refills | Status: DC

## 2023-04-19 NOTE — Assessment & Plan Note (Signed)
Rx vitamin D as the levels low recently. She has legal custody of her 58 month old grandchild which is likely causing some fatigue.

## 2023-04-19 NOTE — Progress Notes (Signed)
   Subjective:   Patient ID: [name], female    DOB: 1960-08-18, 62 y.o.   MRN: [id number]  HPI The patient is a 62 YO female coming in for pain and arthritis. She has crohns and off meds since summer due to coverage. She is also having intermittent stomach issues takes pepto bismol.   Review of Systems  Constitutional:  Positive for activity change.  HENT: Negative.    Eyes: Negative.   Respiratory:  Negative for cough, chest tightness and shortness of breath.   Cardiovascular:  Negative for chest pain, palpitations and leg swelling.  Gastrointestinal:  Negative for abdominal distention, abdominal pain, constipation, diarrhea, nausea and vomiting.  Musculoskeletal:  Positive for arthralgias and myalgias.  Skin: Negative.   Neurological: Negative.   Psychiatric/Behavioral: Negative.      Objective:  Physical Exam Constitutional:      Appearance: She is well-developed.  HENT:     Head: Normocephalic and atraumatic.  Cardiovascular:     Rate and Rhythm: Normal rate and regular rhythm.  Pulmonary:     Effort: Pulmonary effort is normal. No respiratory distress.     Breath sounds: Normal breath sounds. No wheezing or rales.  Abdominal:     General: Bowel sounds are normal. There is no distension.     Palpations: Abdomen is soft.     Tenderness: There is no abdominal tenderness. There is no rebound.  Musculoskeletal:        General: Tenderness present.     Cervical back: Normal range of motion.  Skin:    General: Skin is warm and dry.  Neurological:     Mental Status: She is alert and oriented to person, place, and time.     Coordination: Coordination normal.     Vitals:   04/19/23 1310  BP: 124/80  Pulse: 67  Temp: 98.6 F (37 C)  TempSrc: Oral  SpO2: 98%  Weight: 137 lb (62.1 kg)  Height: 5\' 3"  (1.6 m)    Assessment & Plan:

## 2023-04-19 NOTE — Patient Instructions (Signed)
We have sent in 1 week of prednisone.  We have sent in the high dose vitamin D to take 1 pill weekly.

## 2023-04-19 NOTE — Assessment & Plan Note (Signed)
Not taking repatha due to not having refills. Refilled today and encouraged to continue this and fenofibrate and vascepa.

## 2023-04-19 NOTE — Assessment & Plan Note (Signed)
Recent CRP is high and she is off meds. She is not taking budesonide. Rx prednisone for her joint pain and swelling which could be crohn's related.

## 2023-05-03 ENCOUNTER — Telehealth: Payer: Self-pay | Admitting: Gastroenterology

## 2023-05-03 NOTE — Telephone Encounter (Signed)
Inbound call from patient, requesting to speak with Beth in regards to recent diarrhea. She states she had diarrhea around two days ago and would like to discuss.

## 2023-05-03 NOTE — Telephone Encounter (Signed)
Patient has back pain, lower abdominal discomfort and diarrhea. Diarrhea more than 4 times a day and urgent. Recent travel. She recalls being on antibiotics in the past 2 months. Cristy Folks has not been delivered. She has not had her injection on schedule. I will look into that.

## 2023-05-04 ENCOUNTER — Other Ambulatory Visit: Payer: Self-pay

## 2023-05-04 ENCOUNTER — Other Ambulatory Visit: Payer: Medicare HMO

## 2023-05-04 DIAGNOSIS — K5080 Crohn's disease of both small and large intestine without complications: Secondary | ICD-10-CM

## 2023-05-04 DIAGNOSIS — R197 Diarrhea, unspecified: Secondary | ICD-10-CM

## 2023-05-04 NOTE — Telephone Encounter (Signed)
Spoke with the patient. Provided her with this information and the telephone number to [employer] patient access [phone] option #1. She is instructed to call and confirm what can be used as documentation. She will ask her husband to bring her to the office with her information to fax and to go to the lab.

## 2023-05-04 NOTE — Telephone Encounter (Signed)
I have sent the patient a message through My Chart.

## 2023-05-04 NOTE — Telephone Encounter (Signed)
Called the patient. No answer. Voicemail is full.  Called [employer] patient assistance. The patient has not given household size and income documentation to theprogram. That is why she has not gotten her Skyrizi. They have spoken with her requesting this information. The information can be faxed from this office.

## 2023-05-04 NOTE — Telephone Encounter (Signed)
Please check stat C.diff, if negative will start a short taper of steroids for possible crohn's flare, will need to get her back on [facility] ASAP

## 2023-05-05 LAB — CLOSTRIDIUM DIFFICILE BY PCR: Toxigenic C. Difficile by PCR: POSITIVE — AB

## 2023-05-06 ENCOUNTER — Other Ambulatory Visit: Payer: Self-pay

## 2023-05-06 MED ORDER — DIFICID 200 MG PO TABS: 200.0000 mg | ORAL_TABLET | Freq: Two times a day (BID) | ORAL | 0 refills | Status: DC

## 2023-05-07 ENCOUNTER — Encounter: Payer: Self-pay | Admitting: Gastroenterology

## 2023-05-07 ENCOUNTER — Other Ambulatory Visit: Payer: Self-pay | Admitting: Gastroenterology

## 2023-05-07 MED ORDER — VANCOMYCIN 50 MG/ML ORAL SOLUTION: 125.0000 mg | Freq: Four times a day (QID) | ORAL | 0 refills | Status: AC

## 2023-05-26 NOTE — Telephone Encounter (Signed)
Patient requesting call back to discuss knot located on her growing area. Please advise.

## 2023-05-26 NOTE — Telephone Encounter (Signed)
Patient has a knot in the groing. She noticed it after visiting the bathroom. Her leg was hurting and she did not know why. She felt a knot she had not noticed before. No drainage. No fever. She tells me it is less sore today, but the knot is still there. Appointment made for evaluation.

## 2023-05-27 NOTE — Telephone Encounter (Signed)
Agree with office appointment to evaluate, may need antibiotics, will need to exclude MRSA or abscess.  If there is any concern for fistula, will need MRI pelvis for further evaluation

## 2023-05-30 ENCOUNTER — Telehealth: Payer: Self-pay

## 2023-05-30 NOTE — Telephone Encounter (Signed)
Copied from CRM [id number]. Topic: General - Other >> May 30, 2023 12:33 PM Truddie Crumble wrote: Reason for CRM: Pt called stating she found her DULoxetine so she does not need the samples from the office

## 2023-05-30 NOTE — Telephone Encounter (Signed)
Copied from CRM [id number]. Topic: Clinical - Prescription Issue >> May 30, 2023 12:10 PM [name] wrote:  Reason for CRM: Patient stated she called the pharmacy for her medication DULoxetine (CYMBALTA) 60 MG capsule , pharmacy stated they sent her three but she has only received 1 bottle. Wanted to know if Dr.Crawford and nurses can send her some if they have any available

## 2023-05-31 ENCOUNTER — Other Ambulatory Visit (INDEPENDENT_AMBULATORY_CARE_PROVIDER_SITE_OTHER): Payer: Medicare HMO

## 2023-05-31 ENCOUNTER — Ambulatory Visit: Payer: Medicare HMO | Admitting: Gastroenterology

## 2023-05-31 ENCOUNTER — Encounter: Payer: Self-pay | Admitting: Gastroenterology

## 2023-05-31 VITALS — BP 120/70 | HR 75 | Ht 63.0 in | Wt 136.2 lb

## 2023-05-31 DIAGNOSIS — Z8619 Personal history of other infectious and parasitic diseases: Secondary | ICD-10-CM | POA: Diagnosis not present

## 2023-05-31 DIAGNOSIS — K5080 Crohn's disease of both small and large intestine without complications: Secondary | ICD-10-CM

## 2023-05-31 DIAGNOSIS — R1909 Other intra-abdominal and pelvic swelling, mass and lump: Secondary | ICD-10-CM | POA: Diagnosis not present

## 2023-05-31 LAB — COMPREHENSIVE METABOLIC PANEL
ALT: 48 U/L — ABNORMAL HIGH (ref 0–35)
AST: 35 U/L (ref 0–37)
Albumin: 4.3 g/dL (ref 3.5–5.2)
Alkaline Phosphatase: 112 U/L (ref 39–117)
BUN: 15 mg/dL (ref 6–23)
CO2: 26 meq/L (ref 19–32)
Calcium: 9.9 mg/dL (ref 8.4–10.5)
Chloride: 104 meq/L (ref 96–112)
Creatinine, Ser: 0.54 mg/dL (ref 0.40–1.20)
GFR: 96.93 mL/min (ref >60.00)
Glucose, Bld: 117 mg/dL — ABNORMAL HIGH (ref 70–99)
Potassium: 4.1 meq/L (ref 3.5–5.1)
Sodium: 140 meq/L (ref 135–145)
Total Bilirubin: 0.3 mg/dL (ref 0.2–1.2)
Total Protein: 6.8 g/dL (ref 6.0–8.3)

## 2023-05-31 LAB — CBC WITH DIFFERENTIAL/PLATELET
Basophils Absolute: 0.1 10*3/uL (ref 0.0–0.1)
Basophils Relative: 0.9 % (ref 0.0–3.0)
Eosinophils Absolute: 0.2 10*3/uL (ref 0.0–0.7)
Eosinophils Relative: 3.2 % (ref 0.0–5.0)
HCT: 43.9 % (ref 36.0–46.0)
Hemoglobin: 14.7 g/dL (ref 12.0–15.0)
Lymphocytes Relative: 37.9 % (ref 12.0–46.0)
Lymphs Abs: 2.8 10*3/uL (ref 0.7–4.0)
MCHC: 33.4 g/dL (ref 30.0–36.0)
MCV: 90.4 fL (ref 78.0–100.0)
Monocytes Absolute: 0.8 10*3/uL (ref 0.1–1.0)
Monocytes Relative: 10.5 % (ref 3.0–12.0)
Neutro Abs: 3.6 10*3/uL (ref 1.4–7.7)
Neutrophils Relative %: 47.5 % (ref 43.0–77.0)
Platelets: 305 10*3/uL (ref 150.0–400.0)
RBC: 4.86 Mil/uL (ref 3.87–5.11)
RDW: 13.8 % (ref 11.5–15.5)
WBC: 7.5 10*3/uL (ref 4.0–10.5)

## 2023-05-31 LAB — C-REACTIVE PROTEIN: CRP: <1 mg/dL (ref 0.5–20.0)

## 2023-05-31 NOTE — Patient Instructions (Signed)
 You have been scheduled for a CT scan of the abdomen and pelvis at [facility], 1st floor Radiology. You are scheduled on 06/07/23 at 4:30 pm. You should arrive at 2:30 pm for registration.   If you have any questions regarding your exam or if you need to reschedule, you may call [facility] at [phone] between the hours of 8:00 am and 5:00 pm, Monday-Friday.   Your provider has requested that you go to the basement level for lab work before leaving today. Press B on the elevator. The lab is located at the first door on the left as you exit the elevator.  Follow up in 6 months with Dr.Nandigam.   Due to recent changes in healthcare laws, you may see the results of your imaging and laboratory studies on MyChart before your provider has had a chance to review them.  We understand that in some cases there may be results that are confusing or concerning to you. Not all laboratory results come back in the same time frame and the provider may be waiting for multiple results in order to interpret others.  Please give us  48 hours in order for your provider to thoroughly review all the results before contacting the office for clarification of your results.   Thank you for trusting me with your gastrointestinal care!   Nestor Blower, PA

## 2023-05-31 NOTE — Progress Notes (Signed)
 Chief Complaint: inguinal mass Primary GI MD: Dr. Shila  HPI: 62 year old female history of Crohn's disease, CAD, diabetes, presents for evaluation of inguinal mass  Crohn's History Crohn's disease with predominant small bowel involvement diagnosed in 37.  Initially was managed with prednisone , had significant side effects.  Subsequently was in clinical remission on 6-MP.  6-MP was discontinued in 2017 due to persistently elevated transaminases, have improved after discontinuing 6-MP.  She was started on Humira , clinically failed with persistent symptoms.  Had undetectable drug trough with elevated antibodies.  Started on budesonide  9 mg daily and taper down to 3 mg daily in 2018.  Developed rectal pain and noted to have perianal fistula on MRI pelvis September 2019.  Had elevated A1c on budesonide .  Started Skyrizi  March 2024  Last seen by Dr. Shila 03/22/2023 - CBC, CRP, IBC, ferritin, B12, folate, QuantiFERON gold all normal - CMP with elevated transaminases AST 56, ALT 67, alk phos 125 stable compared to 2023 - Patient tested positive for C. difficile 05/04/2023 and due to previous C. difficile infections patient was put on Dificid  200 Mg twice daily for 10 days. This was too expensive for her so she was given an extended vancomycin  taper in which she has a few days left.  Patient states her diarrhea is back to baseline after being treated with vancomycin .  She has 2-3 soft stools per day without blood.  She does note a small mass in her left inguinal area.  She is unsure what it is but she does note periods of pain with it in which it radiates down her leg.  No nausea, vomiting, fever, chills.  She does note it protrudes more when she stands up.  She has not been on Skyrizi  since at least July.  She is still struggling with insurance to get it approved.  Apparently she is supposed to be told this week whether or not she will be starting her Skyrizi .  PREVIOUS GI WORKUP    Colonoscopy 09/23/21: - One 5 mm polyp in the ascending colon, removed with a cold snare. Resected and retrieved. - Simple Endoscopic Score for Crohn's Disease: 4, mucosal inflammatory changes secondary to Crohn's disease with ileitis. Biopsied. - Diverticulosis in the sigmoid colon. - Non-bleeding external and internal hemorrhoids. 2. Surgical [P], colon, ascending, polyp (1) TUBULAR ADENOMA NEGATIVE FOR HIGH-GRADE DYSPLASIA AND CARCINOMA   Endoscopy 09/23/21: - The Z-line was regular and was found 35 cm from the incisors. - A small hiatal hernia was present. - The stomach was normal. - The cardia and gastric fundus were normal on retroflexion. - The examined duodenum was normal. Pathology 09/23/21: 1. Surgical [P], small bowel, terminal ileum MINIMAL ACUTE ILEITIS (SEE MICROSCOPIC COMMENT) CRP elevated to 55,000 in May 2022 and was persistently elevated on Entyvio  and budesonide    Colonoscopy December 05, 2018: Terminal ileal biopsies consistent with Crohn's disease.  IC valve polypoid lesion status post EMR, biopsies consistent with IBD negative for dysplasia or adenomatous tissue 5 mm polyp, tubular adenoma removed from transverse colon otherwise normal colon on chromoendoscopy   Colonoscopy July 07, 2018 Nodular TI biopsied Localized nodular polypoid mucosa in IC valve biopsied, ?  Adenoma on pathology report, requested additional review is pending.   EGD 08/22/2009: Esophagitis otherwise unremarkable exam    MRI pelvis February 14, 2018 showed grade 1 simple linear intersphincteric perianal fistula without associated abscess.  Mild segmental wall thickening and luminal narrowing in the distal ileum suggestive of mild active Crohn's ileitis.  Mild  chronic wall thickening in the terminal ileum without active inflammatory changes.  Past Medical History:  Diagnosis Date   Allergic rhinitis due to pollen    Allergy     Anxiety state, unspecified    Arthritis    Bronchitis    hx   CAD  (coronary artery disease)    s/p NSTEMI in 11/18 treated with a DES to the proximal LAD   Calculus of gallbladder without mention of cholecystitis or obstruction    Crohn disease (HCC)    Depressive disorder, not elsewhere classified    Diabetes mellitus without complication (HCC)    diet controlled   Esophageal reflux    Fibromyalgia    Grade I diastolic dysfunction    Noted on ECHO   Headache    History of nuclear stress test    ETT-Myoview  10/17: EF 55%, normal perfusion; Low Risk // Nuclear stress test 05/2018:  EF 63, normal perfusion; Low Risk   Myalgia and myositis, unspecified    NSTEMI (non-ST elevated myocardial infarction) (HCC) 04/15/2017   Other and unspecified hyperlipidemia    Palpitations    Pneumonia 13   hx   S/P angioplasty with stent 04/18/17 with DES to pLAD 04/19/2017   Seizures (HCC)    1 seizure as a teenager    Unspecified essential hypertension    Vitamin B deficiency     Past Surgical History:  Procedure Laterality Date   BIOPSY  12/05/2018   Procedure: BIOPSY;  Surgeon: Shila Gustav GAILS, MD;  Location: [facility];  Service: Endoscopy;;   CHOLECYSTECTOMY N/A 09/19/2014   Procedure: LAPAROSCOPIC CHOLECYSTECTOMY WITH INTRAOPERATIVE CHOLANGIOGRAM;  Surgeon: Donnice Lima, MD;  Location: [facility] OR;  Service: General;  Laterality: N/A;   COLONOSCOPY WITH PROPOFOL  N/A 12/05/2018   Procedure: COLONOSCOPY WITH PROPOFOL ;  Surgeon: Shila Gustav GAILS, MD;  Location: [facility];  Service: Endoscopy;  Laterality: N/A;  chromoendoscopy   CORONARY PRESSURE/FFR STUDY N/A 04/18/2017   Procedure: INTRAVASCULAR PRESSURE WIRE/FFR STUDY;  Surgeon: Jordan, Peter M, MD;  Location: [facility] INVASIVE CV LAB;  Service: Cardiovascular;  Laterality: N/A;   CORONARY STENT INTERVENTION N/A 04/18/2017   Procedure: CORONARY STENT INTERVENTION;  Surgeon: Jordan, Peter M, MD;  Location: [facility] INVASIVE CV LAB;  Service: Cardiovascular;  Laterality: N/A;   ESOPHAGOGASTRODUODENOSCOPY      EXPLORATORY LAPAROTOMY     for endometriosis   LEFT HEART CATH AND CORONARY ANGIOGRAPHY N/A 04/18/2017   Procedure: LEFT HEART CATH AND CORONARY ANGIOGRAPHY;  Surgeon: Jordan, Peter M, MD;  Location: [facility] INVAS[facility] CV LAB;  Service: Cardiovascular;  Laterality: N/A;   POLYPECTOMY  12/05/2018   Procedure: POLYPECTOMY;  Surgeon: Shila Gustav GAILS, MD;  Location: [facility];  Service: Endoscopy;;   SUBMUCOSAL LIFTING INJECTION  12/05/2018   Procedure: SUBMUCOSAL LIFTING INJECTION;  Surgeon: Nandigam, Kavitha V, MD;  Location: [facility];  Service: Endoscopy;;   TONSILLECTOMY AND ADENOIDECTOMY     TUBAL LIGATION     BILATERAL    Current Outpatient Medications  Medication Sig Dispense Refill   acetaminophen  (TYLENOL ) 650 MG CR tablet Take 650 mg by mouth every 8 (eight) hours as needed for pain.     albuterol  (PROVENTIL ) (2.5 MG/3ML) 0.083% nebulizer solution Take 2.5 mg by nebulization every 4 (four) hours as needed for wheezing or shortness of breath.      albuterol  (VENTOLIN  HFA) 108 (90 Base) MCG/ACT inhaler Inhale 1-2 puffs into the lungs every 6 (six) hours as needed for wheezing or shortness of breath.     aspirin   EC 81 MG tablet Take 81 mg by mouth daily. Swallow whole.     Blood Glucose Monitoring Suppl (FREESTYLE LITE) w/Device KIT Use to check blood sugar up to 4 times a day as directed 1 kit 0   budesonide  (ENTOCORT EC ) 3 MG 24 hr capsule Take 3 capsules (9 mg total) by mouth daily. 270 capsule 3   colestipol  (COLESTID ) 1 g tablet Take 1 tablet (1 g total) by mouth daily. 90 tablet 3   Continuous Blood Gluc Sensor (FREESTYLE LIBRE 14 DAY SENSOR) MISC Use to monitor sugars 2 each 11   Continuous Blood Gluc Sensor (FREESTYLE LIBRE SENSOR SYSTEM) MISC Use for blood sugar monitoring 1 each 0   cyanocobalamin  (VITAMIN B12) 1000 MCG/ML injection Inject 1 mL (1,000 mcg total) into the muscle every 30 (thirty) days. 1 mL 11   dicyclomine  (BENTYL ) 20 MG tablet Take 1 tablet (20 mg total) by  mouth every 6 (six) hours. 60 tablet 0   DULoxetine  (CYMBALTA ) 60 MG capsule Take 1 capsule (60 mg total) by mouth daily. 90 capsule 3   empagliflozin  (JARDIANCE ) 25 MG TABS tablet Take 1 tablet (25 mg total) by mouth daily before breakfast. 90 tablet 3   enalapril  (VASOTEC ) 10 MG tablet Take 1 tablet (10 mg) by mouth daily. 90 tablet 1   ergocalciferol  (VITAMIN D2) 1.25 MG (50000 UT) capsule Take 1 capsule (50,000 Units total) by mouth once a week. 12 capsule 0   Evolocumab  (REPATHA  SURECLICK) 140 MG/ML SOAJ Inject 140 mg into the skin every 14 (fourteen) days. 6 mL 3   fenofibrate  (TRICOR ) 145 MG tablet TAKE ONE TABLET BY MOUTH ONCE DAILY 90 tablet 1   furosemide  (LASIX ) 40 MG tablet Take 1 tablet (40 mg total) by mouth daily as needed for fluid or edema. 90 tablet 1   glimepiride  (AMARYL ) 2 MG tablet Take 1 tablet (2 mg total) by mouth daily before breakfast. 90 tablet 3   glucose blood (TRUE METRIX BLOOD GLUCOSE TEST) test strip Use to check blood sugar up to 4 times a day as directed. 100 each 3   insulin  glargine-yfgn (SEMGLEE , YFGN,) 100 UNIT/ML Pen Inject 58 Units into the skin daily. 60 mL 3   Insulin  Pen Needle (PEN NEEDLES 3/16) 31G X 5 MM MISC Use daily 50 each 11   Lancets (FREESTYLE) lancets Use to check blood sugar up to 4 times a day as directed 100 each 0   levocetirizine (XYZAL ) 5 MG tablet Take 1 tablet  by mouth every evening. 90 tablet 3   metFORMIN  (GLUCOPHAGE -XR) 500 MG 24 hr tablet Take 2 tablets by mouth daily. 180 tablet 3   metoprolol  succinate (TOPROL -XL) 25 MG 24 hr tablet Take 1.5 tablets (37.5 mg total) by mouth daily. 135 tablet 1   montelukast  (SINGULAIR ) 10 MG tablet Take 1 tablet (10 mg total) by mouth every evening. 90 tablet 3   Multiple Vitamins-Minerals (MULTIVITAMIN WITH MINERALS) tablet Take 1 tablet by mouth daily.     nitroGLYCERIN  (NITROSTAT ) 0.4 MG SL tablet Place 1 tablet (0.4 mg total) under the tongue every 5 (five) minutes as needed for chest pain.   Call 911 if no relief after 1st dose. 25 tablet 5   ondansetron  (ZOFRAN  ODT) 4 MG disintegrating tablet Take 1 tablet (4 mg total) by mouth every 4 (four) hours as needed for nausea or vomiting. 20 tablet 0   pantoprazole  (PROTONIX ) 40 MG tablet Take 1 tablet (40 mg total) by mouth daily. 90 tablet 3  Probiotic Product (PROBIOTIC DAILY PO) Take 1 capsule by mouth daily.     SKYRIZI  360 MG/2.4ML SOCT 360 mg SQ injection by cartridge and repeat every 8 weeks 2.4 mL 7   sodium chloride  (OCEAN) 0.65 % SOLN nasal spray Place 1 spray into both nostrils daily as needed for congestion.     sucralfate  (CARAFATE ) 1 g tablet Take 1 tablet (1 g total) by mouth 4 (four) times daily -  with meals and at bedtime. 120 tablet 0   VASCEPA  1 g capsule Take 2 capsules by mouth 2 times daily. 360 capsule 1   Vitamin D3 (VITAMIN D ) 25 MCG tablet Take 1,000 Units by mouth at bedtime.     No current facility-administered medications for this visit.    Allergies as of 05/31/2023 - Review Complete 05/31/2023  Allergen Reaction Noted   Lipitor [atorvastatin ] Other (See Comments) 03/28/2013   Remicade [infliximab]     Ticagrelor   09/16/2017   Rosuvastatin  Other (See Comments) 01/25/2019    Family History  Problem Relation Age of Onset   Lung cancer Maternal Grandmother    Stroke Father    Hypertension Mother    Heart attack Mother    Heart disease Mother    Hypertension Brother    Heart disease Brother    Hypertension Sister    Heart disease Sister    Coronary artery disease Brother    Colon cancer Neg Hx    Stomach cancer Neg Hx    Esophageal cancer Neg Hx    Rectal cancer Neg Hx     Social History   Socioeconomic History   Marital status: Married    Spouse name: Dane   Number of children: 2   Years of education: 12+   Highest education level: Not on file  Occupational History   Occupation: ACCOUNT REP.    Employer: [employer]    Comment: retired  Tobacco Use   Smoking  status: Former    Current packs/day: 0.00    Average packs/day: 1 pack/day for 35.0 years (35.0 ttl pk-yrs)    Types: Cigarettes    Start date: 05/31/1969    Quit date: 05/31/2004    Years since quitting: 19.0   Smokeless tobacco: Never  Vaping Use   Vaping status: Never Used  Substance and Sexual Activity   Alcohol use: No   Drug use: No   Sexual activity: Not Currently    Comment: married  Other Topics Concern   Not on file  Social History Narrative   Lives with her husband and their pets. Her children are adults and live independently.   Social Drivers of Corporate Investment Banker Strain: Not on file  Food Insecurity: No Food Insecurity (04/06/2022)   Hunger Vital Sign    Worried About Running Out of Food in the Last Year: Never true    Ran Out of Food in the Last Year: Never true  Transportation Needs: No Transportation Needs (04/06/2022)   PRAPARE - Administrator, Civil Service (Medical): No    Lack of Transportation (Non-Medical): No  Physical Activity: Not on file  Stress: Not on file  Social Connections: Not on file  Intimate Partner Violence: Not on file    Review of Systems:    Constitutional: No weight loss, fever, chills, weakness or fatigue HEENT: Eyes: No change in vision               Ears, Nose, Throat:  No change in hearing  or congestion Skin: No rash or itching Cardiovascular: No chest pain, chest pressure or palpitations   Respiratory: No SOB or cough Gastrointestinal: See HPI and otherwise negative Genitourinary: No dysuria or change in urinary frequency Neurological: No headache, dizziness or syncope Musculoskeletal: No new muscle or joint pain Hematologic: No bleeding or bruising Psychiatric: No history of depression or anxiety    Physical Exam:  Vital signs: BP 120/70   Pulse 75   Ht 5' 3 (1.6 m)   Wt 136 lb 3.2 oz (61.8 kg)   LMP  (LMP Unknown)   BMI 24.13 kg/m   Constitutional: NAD, Well developed, Well nourished, alert  and cooperative Head:  Normocephalic and atraumatic. Eyes:   PEERL, EOMI. No icterus. Conjunctiva pink. Respiratory: Respirations even and unlabored. Lungs clear to auscultation bilaterally.   No wheezes, crackles, or rhonchi.  Cardiovascular:  Regular rate and rhythm. No peripheral edema, cyanosis or pallor.  Gastrointestinal:  Soft, nondistended, nontender. No rebound or guarding. Normal bowel sounds. No appreciable masses or hepatomegaly.  Small quarter sized mobile mass in left inguinal area.  Probable lymph node. Rectal:  Not performed.  Msk:  Symmetrical without gross deformities. Without edema, no deformity or joint abnormality.  Neurologic:  Alert and  oriented x4;  grossly normal neurologically.  Skin:   Dry and intact without significant lesions or rashes. Psychiatric: Oriented to person, place and time. Demonstrates good judgement and reason without abnormal affect or behaviors.   RELEVANT LABS AND IMAGING: CBC    Component Value Date/Time   WBC 7.7 03/22/2023 0924   RBC 4.76 03/22/2023 0924   HGB 14.4 03/22/2023 0924   HCT 44.1 03/22/2023 0924   PLT 292.0 03/22/2023 0924   MCV 92.6 03/22/2023 0924   MCV 96.5 05/17/2014 1811   MCH 29.3 06/18/2022 1555   MCHC 32.5 03/22/2023 0924   RDW 13.7 03/22/2023 0924   LYMPHSABS 2.4 03/22/2023 0924   MONOABS 0.5 03/22/2023 0924   EOSABS 0.2 03/22/2023 0924   BASOSABS 0.1 03/22/2023 0924    CMP     Component Value Date/Time   NA 140 03/22/2023 0924   NA 141 08/18/2020 1038   K 4.7 03/22/2023 0924   CL 103 03/22/2023 0924   CO2 27 03/22/2023 0924   GLUCOSE 264 (H) 03/22/2023 0924   BUN 18 03/22/2023 0924   BUN 10 08/18/2020 1038   CREATININE 0.63 03/22/2023 0924   CREATININE 0.64 03/19/2016 1234   CALCIUM  9.2 03/22/2023 0924   PROT 6.6 03/22/2023 0924   PROT 5.8 (L) 04/03/2020 0000   ALBUMIN 4.1 03/22/2023 0924   ALBUMIN 4.0 04/03/2020 0000   AST 56 (H) 03/22/2023 0924   ALT 67 (H) 03/22/2023 0924   ALKPHOS 125 (H)  03/22/2023 0924   BILITOT 0.6 03/22/2023 0924   BILITOT 0.2 04/03/2020 0000   GFRNONAA >60 06/18/2022 1555   GFRNONAA >89 09/08/2013 1502   GFRAA >60 04/23/2019 2159   GFRAA >89 09/08/2013 1502     Assessment/Plan:   .Crohn's disease of both small and large intestine without complication (HCC) Has failed anti-TNF and Entyvio  in the past.  Elevation of A1c with budesonide .  Skyrizi  treatment continues to be interrupted due to insurance issues with last dose in July.  She notes she is post to resume Skyrizi  sometime this week according to insurance. - Will check with RN on status of Skyrizi  - May need to consider doing another induction dose since it has been 6 months - Recent IBD health maintenance in  October was unrevealing - Needs repeat colonoscopy but would ideally do this after she has been on Skyrizi  for a bit to assess disease control and response to Skyrizi .  She is currently at her baseline symptoms. - Follow-up 3 to 4 months  Mass of left inguinal region Small mobile palpable mass in left inguinal region.  Suspect this is a reactive lymph node especially with a recent history of C. difficile.  Although it does somewhat protrude when she stands up, on physical exam it does not feel like a hernia, but cannot rule out.  This is concerning for patient. - CT pelvis with contrast for further evaluation - CBC, CMP, CRP  History of C. difficile Stools back at baseline.  Resolved post vancomycin .  Dificid  was too expensive for her.  Nestor Mollie RIGGERS  Gastroenterology 05/31/2023, 12:28 PM  Cc: Rollene Almarie LABOR, *

## 2023-06-07 ENCOUNTER — Ambulatory Visit (HOSPITAL_COMMUNITY): Payer: Medicare HMO

## 2023-06-08 ENCOUNTER — Other Ambulatory Visit: Payer: Self-pay

## 2023-06-08 ENCOUNTER — Ambulatory Visit (HOSPITAL_COMMUNITY)
Admission: RE | Admit: 2023-06-08 | Discharge: 2023-06-08 | Disposition: A | Discharge status: Home / Self Care | Payer: Medicare Other | Source: Ambulatory Visit | Attending: Gastroenterology | Admitting: Gastroenterology

## 2023-06-08 ENCOUNTER — Telehealth: Payer: Self-pay

## 2023-06-08 DIAGNOSIS — R590 Localized enlarged lymph nodes: Secondary | ICD-10-CM | POA: Diagnosis not present

## 2023-06-08 DIAGNOSIS — K5080 Crohn's disease of both small and large intestine without complications: Secondary | ICD-10-CM | POA: Diagnosis not present

## 2023-06-08 DIAGNOSIS — R1909 Other intra-abdominal and pelvic swelling, mass and lump: Secondary | ICD-10-CM | POA: Insufficient documentation

## 2023-06-08 DIAGNOSIS — K5730 Diverticulosis of large intestine without perforation or abscess without bleeding: Secondary | ICD-10-CM | POA: Diagnosis not present

## 2023-06-08 DIAGNOSIS — A0472 Enterocolitis due to Clostridium difficile, not specified as recurrent: Secondary | ICD-10-CM

## 2023-06-08 DIAGNOSIS — K5090 Crohn's disease, unspecified, without complications: Secondary | ICD-10-CM | POA: Diagnosis not present

## 2023-06-08 DIAGNOSIS — R197 Diarrhea, unspecified: Secondary | ICD-10-CM

## 2023-06-08 DIAGNOSIS — Z8619 Personal history of other infectious and parasitic diseases: Secondary | ICD-10-CM

## 2023-06-08 MED ORDER — IOHEXOL 300 MG/ML  SOLN
30.0000 mL | Freq: Once | INTRAMUSCULAR | Status: AC | PRN
  Administered 2023-06-08: 30 mL via ORAL

## 2023-06-08 MED ORDER — IOHEXOL 300 MG/ML  SOLN
100.0000 mL | Freq: Once | INTRAMUSCULAR | Status: AC | PRN
  Administered 2023-06-08: 100 mL via INTRAVENOUS

## 2023-06-08 NOTE — Telephone Encounter (Signed)
 Patient reaching out to us  with complaints that she is having a lot of diarrhea and it's more frequent. She is concerned she may still have C Diff infection. Patient is getting her CT today. She agrees to come by the office and pick up a [employer] test per Dr Shila.

## 2023-06-09 ENCOUNTER — Other Ambulatory Visit: Payer: Medicare Other

## 2023-06-09 DIAGNOSIS — Z8619 Personal history of other infectious and parasitic diseases: Secondary | ICD-10-CM

## 2023-06-09 DIAGNOSIS — R197 Diarrhea, unspecified: Secondary | ICD-10-CM

## 2023-06-14 ENCOUNTER — Telehealth: Payer: Self-pay | Admitting: Internal Medicine

## 2023-06-14 ENCOUNTER — Telehealth: Payer: Self-pay | Admitting: Gastroenterology

## 2023-06-14 ENCOUNTER — Other Ambulatory Visit: Payer: Self-pay

## 2023-06-14 DIAGNOSIS — R599 Enlarged lymph nodes, unspecified: Secondary | ICD-10-CM

## 2023-06-14 NOTE — Telephone Encounter (Signed)
 [name], she is awaiting shipment of Skyrizi , needs to send paperwork to qualify for drug company sponsored discount. She is having 3-4 semiformed to loose bowel movements per day, denies any nocturnal diarrhea.  She is currently taking colestipol  once daily, advised her to increase it to 1 g twice daily, avoid taking it with her other medications to prevent drug interaction, wait for 2 hours prior to taking colestipol  after she takes her meds Stool C. difficile negative TB QuantiFERON gold negative in October 2024  She has painful nodule in the left groin, likely enlarged left inguinal lymph node, also noted other enlarged pelvic lymph nodes. Please send referral to IR for ultrasound guided biopsy of left inguinal lymph node to exclude lymphoma/acute infectious etiology  Send referral to her gynecologist for pelvic exam  Will plan to repeat CT abdomen pelvis with pancreas protocol in 4 to 6 weeks, noted masslike nodularity extending from the anterior pancreatic tail, was seen on previous CT scan with similar appearance

## 2023-06-14 NOTE — Telephone Encounter (Signed)
 Copied from CRM [id number]. Topic: Clinical - Lab/Test Results >> Jun 14, 2023 10:51 AM Burnard DEL wrote: Reason for CRM: patient is requesting a call back from provider regarding CT scan results.She wants to make sure provider has received results and looked them over,and would like to know what is the next plan of action.

## 2023-06-14 NOTE — Telephone Encounter (Signed)
 GI ordered scan so questions should be directed to them about results of scan

## 2023-06-14 NOTE — Telephone Encounter (Signed)
 Pt is regarding to CT done on 1/12

## 2023-06-15 ENCOUNTER — Other Ambulatory Visit: Payer: Self-pay

## 2023-06-15 DIAGNOSIS — K8689 Other specified diseases of pancreas: Secondary | ICD-10-CM

## 2023-06-15 DIAGNOSIS — R599 Enlarged lymph nodes, unspecified: Secondary | ICD-10-CM

## 2023-06-15 NOTE — Telephone Encounter (Signed)
 Referrals placed for GYN, U/S biopsy by Interventional Radiology and the CT scan to be done between 07/13/23 and 07/27/23.  Patient notified.

## 2023-06-15 NOTE — Progress Notes (Signed)
 [name] Silvas, MD  Joelle Musca PROCEDURE / BIOPSY REVIEW Date: 06/15/23  Requested Biopsy site: L inguingal LAN Reason for request:  Prominent/mildly enlarged left iliac side chain, pelvic sidewall and inguinal lymph nodes, nonspecific, possibly reactive versus lymphoproliferative process. Consider correlation with laboratory values follow-up CT abdomen pelvis in 3 months to assess for stability or resolution. A more aggressive approach would include ultrasound-guided CORE of the dominant left inguinal lymph node. Imaging review: Best seen on CT 06/08/23  Decision: Approved Imaging modality to perform: Ultrasound Schedule with: Patient preference (Local vs Mod Sed) Schedule for: Any VIR  Additional comments:   Please contact me with questions, concerns, or if issue pertaining to this request arise.  Dayne [name] Silvas, MD [facility]       Previous Messages    ----- Message ----- From: Rodgerick Gilliand Sent: 06/15/2023   8:46 AM EST To: Catalyna Reilly; Ir Procedure Requests Subject: US  Core Biopsy ( lymph nodes)                  Procedure : US  Core Biopsy ( Lymph nodes)  Reason : biopsy of enlarged lymph node in groin-CT imaging abnormal Dx: Enlarged lymph nodes [R59.9 (ICD-10-CM)]   History : CT abd pelvis w/  Provider : Sergio Dandy, MD  Provider contact: [phone]

## 2023-06-20 DIAGNOSIS — R8290 Unspecified abnormal findings in urine: Secondary | ICD-10-CM | POA: Diagnosis not present

## 2023-06-20 DIAGNOSIS — R590 Localized enlarged lymph nodes: Secondary | ICD-10-CM | POA: Diagnosis not present

## 2023-06-27 DIAGNOSIS — M79641 Pain in right hand: Secondary | ICD-10-CM | POA: Diagnosis not present

## 2023-06-27 DIAGNOSIS — Z6824 Body mass index (BMI) 24.0-24.9, adult: Secondary | ICD-10-CM | POA: Diagnosis not present

## 2023-06-27 DIAGNOSIS — K5090 Crohn's disease, unspecified, without complications: Secondary | ICD-10-CM | POA: Diagnosis not present

## 2023-06-27 DIAGNOSIS — M5459 Other low back pain: Secondary | ICD-10-CM | POA: Diagnosis not present

## 2023-06-27 DIAGNOSIS — M1991 Primary osteoarthritis, unspecified site: Secondary | ICD-10-CM | POA: Diagnosis not present

## 2023-06-27 DIAGNOSIS — M797 Fibromyalgia: Secondary | ICD-10-CM | POA: Diagnosis not present

## 2023-06-27 DIAGNOSIS — M79642 Pain in left hand: Secondary | ICD-10-CM | POA: Diagnosis not present

## 2023-07-05 ENCOUNTER — Other Ambulatory Visit: Payer: Self-pay

## 2023-07-05 ENCOUNTER — Other Ambulatory Visit: Payer: Self-pay | Admitting: Radiology

## 2023-07-05 DIAGNOSIS — E118 Type 2 diabetes mellitus with unspecified complications: Secondary | ICD-10-CM

## 2023-07-05 NOTE — Progress Notes (Incomplete)
Chief Complaint: Lymphadenopathy  Referring Provider(s): Heber Wilmette  Supervising Physician: Malachy Moan  Patient Status: [facility] - Out-pt  History of Present Illness: [name] is a 63 y.o. female with medical issues including CAD (NSTEMI with stenting), Crohn's, and diabetes.    CT scan done 06/12/23 showed = Prominent/mildly enlarged left iliac side chain, pelvic sidewall and inguinal lymph nodes, nonspecific, possibly reactive versus lymphoproliferative process.   Images reviewed by Dr. Deanne Coffer.  She is here today for image guided biopsy.  She is NPO. No nausea/vomiting. No Fever/chills. ROS negative.   Patient is Full Code  Past Medical History:  Diagnosis Date   Allergic rhinitis due to pollen    Allergy    Anxiety state, unspecified    Arthritis    Bronchitis    hx   CAD (coronary artery disease)    s/p NSTEMI in 11/18 treated with a DES to the proximal LAD   Calculus of gallbladder without mention of cholecystitis or obstruction    Crohn disease (HCC)    Depressive disorder, not elsewhere classified    Diabetes mellitus without complication (HCC)    diet controlled   Esophageal reflux    Fibromyalgia    Grade I diastolic dysfunction    Noted on ECHO   Headache    History of nuclear stress test    ETT-Myoview 10/17: EF 55%, normal perfusion; Low Risk // Nuclear stress test 05/2018:  EF 63, normal perfusion; Low Risk   Myalgia and myositis, unspecified    NSTEMI (non-ST elevated myocardial infarction) (HCC) 04/15/2017   Other and unspecified hyperlipidemia    Palpitations    Pneumonia 13   hx   S/P angioplasty with stent 04/18/17 with DES to pLAD 04/19/2017   Seizures (HCC)    1 seizure as a teenager    Unspecified essential hypertension    Vitamin B deficiency     Past Surgical History:  Procedure Laterality Date   BIOPSY  12/05/2018   Procedure: BIOPSY;  Surgeon: Napoleon Form, MD;  Location: [facility];  Service:  Endoscopy;;   CHOLECYSTECTOMY N/A 09/19/2014   Procedure: LAPAROSCOPIC CHOLECYSTECTOMY WITH INTRAOPERATIVE CHOLANGIOGRAM;  Surgeon: Manus Rudd, MD;  Location: [facility] OR;  Service: General;  Laterality: N/A;   COLONOSCOPY WITH PROPOFOL N/A 12/05/2018   Procedure: COLONOSCOPY WITH PROPOFOL;  Surgeon: Napoleon Form, MD;  Location: [facility];  Service: Endoscopy;  Laterality: N/A;  chromoendoscopy   CORONARY PRESSURE/FFR STUDY N/A 04/18/2017   Procedure: INTRAVASCULAR PRESSURE WIRE/FFR STUDY;  Surgeon: Swaziland, Peter M, MD;  Location: [facility] INVASIVE CV LAB;  Service: Cardiovascular;  Laterality: N/A;   CORONARY STENT INTERVENTION N/A 04/18/2017   Procedure: CORONARY STENT INTERVENTION;  Surgeon: Swaziland, Peter M, MD;  Location: [facility] INVASIVE CV LAB;  Service: Cardiovascular;  Laterality: N/A;   ESOPHAGOGASTRODUODENOSCOPY     EXPLORATORY LAPAROTOMY     for endometriosis   LEFT HEART CATH AND CORONARY ANGIOGRAPHY N/A 04/18/2017   Procedure: LEFT HEART CATH AND CORONARY ANGIOGRAPHY;  Surgeon: Swaziland, Peter M, MD;  Location: [facility] INVASIVE CV LAB;  Service: Cardiovascular;  Laterality: N/A;   POLYPECTOMY  12/05/2018   Procedure: POLYPECTOMY;  Surgeon: Napoleon Form, MD;  Location: [facility];  Service: Endoscopy;;   SUBMUCOSAL LIFTING INJECTION  12/05/2018   Procedure: SUBMUCOSAL LIFTING INJECTION;  Surgeon: Napoleon Form, MD;  Location: [facility];  Service: Endoscopy;;   TONSILLECTOMY AND ADENOIDECTOMY     TUBAL LIGATION     BILATERAL  Allergies: Lipitor [atorvastatin], Remicade [infliximab], Ticagrelor, and Rosuvastatin  Medications: Prior to Admission medications   Medication Sig Start Date End Date Taking? Authorizing Provider  acetaminophen (TYLENOL) 650 MG CR tablet Take 650 mg by mouth every 8 (eight) hours as needed for pain.    [provider]  albuterol (PROVENTIL) (2.5 MG/3ML) 0.083% nebulizer solution Take 2.5 mg by nebulization every 4 (four) hours as needed for  wheezing or shortness of breath.     [provider]  albuterol (VENTOLIN HFA) 108 (90 Base) MCG/ACT inhaler Inhale 1-2 puffs into the lungs every 6 (six) hours as needed for wheezing or shortness of breath.    [provider]  aspirin EC 81 MG tablet Take 81 mg by mouth daily. Swallow whole.    [provider]  Blood Glucose Monitoring Suppl (FREESTYLE LITE) w/Device KIT Use to check blood sugar up to 4 times a day as directed 03/10/21   Myrlene Broker, MD  budesonide (ENTOCORT EC) 3 MG 24 hr capsule Take 3 capsules (9 mg total) by mouth daily. 12/30/22   Napoleon Form, MD  colestipol (COLESTID) 1 g tablet Take 1 tablet (1 g total) by mouth daily. 12/30/22   Napoleon Form, MD  Continuous Blood Gluc Sensor (FREESTYLE LIBRE 14 DAY SENSOR) MISC Use to monitor sugars 04/03/21   Myrlene Broker, MD  Continuous Blood Gluc Sensor (FREESTYLE LIBRE SENSOR SYSTEM) MISC Use for blood sugar monitoring 04/03/21   Myrlene Broker, MD  cyanocobalamin (VITAMIN B12) 1000 MCG/ML injection Inject 1 mL (1,000 mcg total) into the muscle every 30 (thirty) days. 12/30/22   Napoleon Form, MD  dicyclomine (BENTYL) 20 MG tablet Take 1 tablet (20 mg total) by mouth every 6 (six) hours. 06/02/21   Iva Boop, MD  DULoxetine (CYMBALTA) 60 MG capsule Take 1 capsule (60 mg total) by mouth daily. 01/10/23   Myrlene Broker, MD  empagliflozin (JARDIANCE) 25 MG TABS tablet Take 1 tablet (25 mg total) by mouth daily before breakfast. 01/10/23   Myrlene Broker, MD  enalapril (VASOTEC) 10 MG tablet Take 1 tablet (10 mg) by mouth daily. 12/29/22   Meriam Sprague, MD  ergocalciferol (VITAMIN D2) 1.25 MG (50000 UT) capsule Take 1 capsule (50,000 Units total) by mouth once a week. 04/19/23   Myrlene Broker, MD  Evolocumab (REPATHA SURECLICK) 140 MG/ML SOAJ Inject 140 mg into the skin every 14 (fourteen) days. 04/19/23   Myrlene Broker, MD  fenofibrate  (TRICOR) 145 MG tablet TAKE ONE TABLET BY MOUTH ONCE DAILY 12/29/22   Meriam Sprague, MD  furosemide (LASIX) 40 MG tablet Take 1 tablet (40 mg total) by mouth daily as needed for fluid or edema. 12/29/22   Meriam Sprague, MD  glimepiride (AMARYL) 2 MG tablet Take 1 tablet (2 mg total) by mouth daily before breakfast. 01/10/23   Myrlene Broker, MD  glucose blood (TRUE METRIX BLOOD GLUCOSE TEST) test strip Use to check blood sugar up to 4 times a day as directed. 09/23/21   Myrlene Broker, MD  insulin glargine-yfgn (SEMGLEE, YFGN,) 100 UNIT/ML Pen Inject 58 Units into the skin daily. 01/10/23   Myrlene Broker, MD  Insulin Pen Needle (PEN NEEDLES 3/16") 31G X 5 MM MISC Use daily 01/11/23   Napoleon Form, MD  Lancets (FREESTYLE) lancets Use to check blood sugar up to 4 times a day as directed 03/10/21   Myrlene Broker, MD  levocetirizine (  XYZAL) 5 MG tablet Take 1 tablet  by mouth every evening. 01/10/23   Myrlene Broker, MD  metFORMIN (GLUCOPHAGE-XR) 500 MG 24 hr tablet Take 2 tablets by mouth daily. 01/10/23   Myrlene Broker, MD  metoprolol succinate (TOPROL-XL) 25 MG 24 hr tablet Take 1.5 tablets (37.5 mg total) by mouth daily. 12/29/22   Meriam Sprague, MD  montelukast (SINGULAIR) 10 MG tablet Take 1 tablet (10 mg total) by mouth every evening. 01/10/23   Myrlene Broker, MD  Multiple Vitamins-Minerals (MULTIVITAMIN WITH MINERALS) tablet Take 1 tablet by mouth daily.    [provider]  nitroGLYCERIN (NITROSTAT) 0.4 MG SL tablet Place 1 tablet (0.4 mg total) under the tongue every 5 (five) minutes as needed for chest pain.  Call 911 if no relief after 1st dose. 12/29/22   Meriam Sprague, MD  ondansetron (ZOFRAN ODT) 4 MG disintegrating tablet Take 1 tablet (4 mg total) by mouth every 4 (four) hours as needed for nausea or vomiting. 10/18/20   Arby Barrette, MD  pantoprazole (PROTONIX) 40 MG tablet Take 1 tablet (40 mg total)  by mouth daily. 12/30/22   Napoleon Form, MD  Probiotic Product (PROBIOTIC DAILY PO) Take 1 capsule by mouth daily.    [provider]  SKYRIZI 360 MG/2.4ML SOCT 360 mg SQ injection by cartridge and repeat every 8 weeks 03/17/23   Napoleon Form, MD  sodium chloride (OCEAN) 0.65 % SOLN nasal spray Place 1 spray into both nostrils daily as needed for congestion.    [provider]  sucralfate (CARAFATE) 1 g tablet Take 1 tablet (1 g total) by mouth 4 (four) times daily -  with meals and at bedtime. 11/24/22   Napoleon Form, MD  VASCEPA 1 g capsule Take 2 capsules by mouth 2 times daily. 12/29/22   Meriam Sprague, MD  Vitamin D3 (VITAMIN D) 25 MCG tablet Take 1,000 Units by mouth at bedtime.    [provider]  EPIPEN 2-PAK 0.3 MG/0.3ML SOAJ injection Inject 0.3 mg into the muscle as needed for anaphylaxis. Reported on 06/27/2015 10/09/14   [provider]  insulin glargine (LANTUS) 100 UNIT/ML injection Inject 0.48 mLs (48 Units total) into the skin at bedtime. 12/15/20 12/22/20  Myrlene Broker, MD     Family History  Problem Relation Age of Onset   Lung cancer Maternal Grandmother    Stroke Father    Hypertension Mother    Heart attack Mother    Heart disease Mother    Hypertension Brother    Heart disease Brother    Hypertension Sister    Heart disease Sister    Coronary artery disease Brother    Colon cancer Neg Hx    Stomach cancer Neg Hx    Esophageal cancer Neg Hx    Rectal cancer Neg Hx     Social History   Socioeconomic History   Marital status: Married    Spouse name: Dane   Number of children: 2   Years of education: 12+   Highest education level: Not on file  Occupational History   Occupation: ACCOUNT REP.    Employer: [employer]    Comment: retired  Tobacco Use   Smoking status: Former    Current packs/day: 0.00    Average packs/day: 1 pack/day for 35.0 years (35.0 ttl pk-yrs)    Types:  Cigarettes    Start date: 05/31/1969    Quit date: 05/31/2004  Years since quitting: 19.1   Smokeless tobacco: Never  Vaping Use   Vaping status: Never Used  Substance and Sexual Activity   Alcohol use: No   Drug use: No   Sexual activity: Not Currently    Comment: married  Other Topics Concern   Not on file  Social History Narrative   Lives with her husband and their pets. Her children are adults and live independently.   Social Drivers of Corporate investment banker Strain: Not on file  Food Insecurity: No Food Insecurity (04/06/2022)   Hunger Vital Sign    Worried About Running Out of Food in the Last Year: Never true    Ran Out of Food in the Last Year: Never true  Transportation Needs: No Transportation Needs (04/06/2022)   PRAPARE - Administrator, Civil Service (Medical): No    Lack of Transportation (Non-Medical): No  Physical Activity: Not on file  Stress: Not on file  Social Connections: Not on file     Review of Systems: A 12 point ROS discussed and pertinent positives are indicated in the HPI above.  All other systems are negative.   Vital Signs: LMP  (LMP Unknown)   Advance Care Plan: The advanced care place/surrogate decision maker was discussed at the time of visit and the patient did not wish to discuss or was not able to name a surrogate decision maker or provide an advance care plan.  Physical Exam Vitals reviewed.  Constitutional:      Appearance: Normal appearance.  Cardiovascular:     Rate and Rhythm: Normal rate and regular rhythm.  Pulmonary:     Effort: Pulmonary effort is normal. No respiratory distress.  Musculoskeletal:        General: Normal range of motion.     Cervical back: Normal range of motion.  Skin:    General: Skin is warm and dry.  Neurological:     General: No focal deficit present.     Mental Status: She is alert and oriented to person, place, and time.  Psychiatric:        Mood and Affect: Mood normal.         Behavior: Behavior normal.        Thought Content: Thought content normal.        Judgment: Judgment normal.     Imaging: CT ABDOMEN PELVIS W CONTRAST Result Date: 06/12/2023 CLINICAL DATA:  Left inguinal mass. History of Crohn's disease. * Tracking Code: BO * EXAM: CT ABDOMEN AND PELVIS WITH CONTRAST TECHNIQUE: Multidetector CT imaging of the abdomen and pelvis was performed using the standard protocol following bolus administration of intravenous contrast. RADIATION DOSE REDUCTION: This exam was performed according to the departmental dose-optimization program which includes automated exposure control, adjustment of the mA and/or kV according to patient size and/or use of iterative reconstruction technique. CONTRAST:  OMNIPAQUE IOHEXOL 300 MG/ML  SOLN COMPARISON:  Multiple priors including CT Oct 18, 2020 FINDINGS: Lower chest: No acute abnormality. Hepatobiliary: Diffuse hepatic steatosis. Gallbladder surgically absent. No biliary ductal dilation. Pancreas: No pancreatic ductal dilation or evidence of acute inflammation. 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 Spleen: No splenomegaly or focal splenic lesion. Adrenals/Urinary Tract: Bilateral adrenal glands appear normal. No hydronephrosis. Kidneys demonstrate  symmetric enhancement and excretion of contrast material. Urinary bladder is unremarkable for degree of distension. Stomach/Bowel: Radiopaque enteric contrast material traverses the ileocecal valve. Stomach is unremarkable for degree of distension. No pathologic dilation of small or large bowel. Scattered colonic diverticulosis without findings of acute diverticulitis. No evidence of acute  bowel inflammation. Vascular/Lymphatic: Aortic atherosclerosis. Normal caliber abdominal aorta. Smooth IVC contours. The portal, splenic and superior mesenteric veins are patent. Prominent/mildly enlarged left iliac side chain, pelvic sidewall and inguinal lymph nodes. For reference: -high left common iliac lymph node measures 9 mm in short axis on image 48/2 -left external iliac lymph node measures 10 mm in short axis on image 74/2 -left inguinal lymph node measures 15 mm in short axis on image 78/2. Reproductive: Uterus and bilateral adnexa are unremarkable. Other: No significant abdominopelvic free fluid. Musculoskeletal: Multilevel degenerative change of the spine. IMPRESSION: 1. Prominent/mildly enlarged left iliac side chain, pelvic sidewall and inguinal lymph nodes, nonspecific, possibly reactive versus lymphoproliferative process. Consider correlation with laboratory values follow-up CT abdomen pelvis in 3 months to assess for stability or resolution. A more aggressive approach would include ultrasound-guided fine-needle aspiration of the dominant left inguinal lymph node. 2. Diffuse hepatic steatosis. 3. Scattered colonic diverticulosis without findings of acute diverticulitis. 4.  Aortic Atherosclerosis (ICD10-I70.0). Electronically Signed   By: Maudry Mayhew M.D.   On: 06/12/2023 11:27    Labs:  CBC: Recent Labs    03/22/23 0924 05/31/23 1237  WBC 7.7 7.5  HGB 14.4 14.7  HCT 44.1 43.9  PLT 292.0 305.0    COAGS: No results for input(s): "INR", "APTT" in the last 8760 hours.  BMP: Recent Labs    03/22/23 0924 05/31/23 1237  NA 140 140  K 4.7 4.1  CL 103 104  CO2 27 26  GLUCOSE 264* 117*  BUN 18 15  CALCIUM 9.2 9.9  CREATININE 0.63 0.54    LIVER FUNCTION TESTS: Recent Labs    03/22/23 0924 05/31/23 1237  BILITOT 0.6 0.3  AST 56* 35  ALT 67* 48*  ALKPHOS 125* 112  PROT 6.6 6.8  ALBUMIN 4.1 4.3    TUMOR MARKERS: No results for input(s): "AFPTM", "CEA", "CA199",  "CHROMGRNA" in the last 8760 hours.  Assessment and Plan:  Prominent/mildly enlarged left iliac side chain, pelvic sidewall and inguinal lymph nodes, nonspecific, possibly reactive versus lymphoproliferative process.   Will proceed with image guided biopsy today by Dr. Archer Asa.  Risks and benefits of inguinal node biopsy was discussed with the patient and/or patient's family including, but not limited to bleeding, infection, damage to adjacent structures or low yield requiring additional tests.  All of the questions were answered and there is agreement to proceed.  Consent signed and in chart.   Thank you for allowing our service to participate in SHEKINAH PITONES 's care.  Electronically Signed: Gwynneth Macleod, PA-C   07/05/2023, 3:29 PM      I spent a total of  15 Minutes   in face to face in clinical consultation, greater than 50% of which was counseling/coordinating care for Lymph node biopsy.

## 2023-07-06 ENCOUNTER — Other Ambulatory Visit (HOSPITAL_COMMUNITY): Payer: Self-pay

## 2023-07-06 ENCOUNTER — Ambulatory Visit (HOSPITAL_COMMUNITY)
Admission: RE | Admit: 2023-07-06 | Discharge: 2023-07-06 | Disposition: A | Discharge status: Home / Self Care | Payer: Medicare Other | Source: Ambulatory Visit | Attending: Gastroenterology | Admitting: Gastroenterology

## 2023-07-06 ENCOUNTER — Telehealth: Payer: Self-pay | Admitting: Pharmacy Technician

## 2023-07-06 ENCOUNTER — Encounter: Payer: Self-pay | Admitting: Gastroenterology

## 2023-07-06 DIAGNOSIS — I898 Other specified noninfective disorders of lymphatic vessels and lymph nodes: Secondary | ICD-10-CM | POA: Diagnosis not present

## 2023-07-06 DIAGNOSIS — R591 Generalized enlarged lymph nodes: Secondary | ICD-10-CM | POA: Diagnosis not present

## 2023-07-06 DIAGNOSIS — R590 Localized enlarged lymph nodes: Secondary | ICD-10-CM | POA: Diagnosis not present

## 2023-07-06 DIAGNOSIS — R599 Enlarged lymph nodes, unspecified: Secondary | ICD-10-CM

## 2023-07-06 MED ORDER — LIDOCAINE HCL 1 % IJ SOLN
INTRAMUSCULAR | Status: AC
  Filled 2023-07-06: qty 20

## 2023-07-06 NOTE — Telephone Encounter (Signed)
 Pharmacy Patient Advocate Encounter  Received notification from [employer] that Prior Authorization for SKYRIZI  360MG  has been APPROVED from 2.5.25 to 8.5.25. Ran test claim, Copay is $2000. This test claim was processed through [facility]- copay amounts may vary at other pharmacies due to pharmacy/plan contracts, or as the patient moves through the different stages of their insurance plan.   PA #/Case ID/Reference #:  [id number]

## 2023-07-06 NOTE — Telephone Encounter (Signed)
 Pharmacy Patient Advocate Encounter   Received notification from Physician's Office that prior authorization for SKYRIZI  360MG  is required/requested.   Insurance verification completed.   The patient is insured through [facility] .   Per test claim: PA required; PA submitted to above mentioned insurance via CoverMyMeds Key/confirmation #/EOC [id number] Status is pending

## 2023-07-07 ENCOUNTER — Telehealth: Payer: Self-pay

## 2023-07-07 NOTE — Telephone Encounter (Signed)
Patient notified of the plan

## 2023-07-07 NOTE — Telephone Encounter (Signed)
 Dr. Nandigam and Almarie, patient will be scheduled as soon as possible.  Auth Submission: APPROVED Site of care: Site of care: [facility] Payer: [employer]HC medicare Medication & CPT/J Code(s) submitted: Skyrizi  (Risankizumab -rzaa) G7672 Route of submission (phone, fax, portal): portal Phone # Fax # Auth type: Buy/Bill PB Units/visits requested: 600mg  x 3 doses Reference number: [id number] Approval from: 07/07/23 to 10/04/23  I have referred the patient to [employer] for copay assistance

## 2023-07-08 LAB — SURGICAL PATHOLOGY

## 2023-07-08 NOTE — Telephone Encounter (Signed)
 Thank you.  Lymph node biopsy showed reactive lymphoid tissue, negative for lymphoma or any infection.

## 2023-07-13 ENCOUNTER — Ambulatory Visit (HOSPITAL_COMMUNITY)
Admission: RE | Admit: 2023-07-13 | Discharge: 2023-07-13 | Disposition: A | Discharge status: Home / Self Care | Payer: Medicare Other | Source: Ambulatory Visit | Attending: Gastroenterology | Admitting: Gastroenterology

## 2023-07-13 DIAGNOSIS — R599 Enlarged lymph nodes, unspecified: Secondary | ICD-10-CM | POA: Diagnosis not present

## 2023-07-13 DIAGNOSIS — Z9049 Acquired absence of other specified parts of digestive tract: Secondary | ICD-10-CM | POA: Diagnosis not present

## 2023-07-13 DIAGNOSIS — K8689 Other specified diseases of pancreas: Secondary | ICD-10-CM | POA: Diagnosis not present

## 2023-07-13 DIAGNOSIS — K760 Fatty (change of) liver, not elsewhere classified: Secondary | ICD-10-CM | POA: Diagnosis not present

## 2023-07-13 LAB — POCT I-STAT CREATININE: Creatinine, Ser: 0.7 mg/dL (ref 0.44–1.00)

## 2023-07-13 MED ORDER — IOHEXOL 300 MG/ML  SOLN
100.0000 mL | Freq: Once | INTRAMUSCULAR | Status: AC | PRN
  Administered 2023-07-13: 100 mL via INTRAVENOUS

## 2023-07-18 ENCOUNTER — Ambulatory Visit (INDEPENDENT_AMBULATORY_CARE_PROVIDER_SITE_OTHER): Payer: Medicare Other

## 2023-07-18 VITALS — BP 130/76 | HR 77 | Temp 97.7°F | Resp 14 | Ht 63.0 in | Wt 138.0 lb

## 2023-07-18 DIAGNOSIS — K5010 Crohn's disease of large intestine without complications: Secondary | ICD-10-CM

## 2023-07-18 MED ORDER — SODIUM CHLORIDE 0.9 % IV SOLN
600.0000 mg | Freq: Once | INTRAVENOUS | Status: AC
  Administered 2023-07-18: 600 mg via INTRAVENOUS
  Filled 2023-07-18: qty 10

## 2023-07-18 NOTE — Patient Instructions (Signed)
 Risankizumab Injection What is this medication? RISANKIZUMAB (RIS an KIZ ue mab) treats autoimmune conditions, such as psoriasis, arthritis, Crohn disease, and ulcerative colitis. It works by slowing down an overactive immune system.  It is a monoclonal antibody. This medicine may be used for other purposes; ask your health care provider or pharmacist if you have questions. COMMON BRAND NAME(S): Skyrizi What should I tell my care team before I take this medication? They need to know if you have any of these conditions: Hepatic disease Immune system problems Infection, such as tuberculosis (TB), bacterial, fungal or viral infections Recent or upcoming vaccine An unusual or allergic reaction to risankizumab, other medications, foods, dyes, or preservatives Pregnant or trying to get pregnant Breast-feeding How should I use this medication? This medication is injected into a vein or under the skin. It is given by your care team in a hospital or clinic setting. It may also be given at home. If you get this medication at home, you will be taught how to prepare and give it. Use exactly as directed. Take it as directed on the prescription label. Keep taking it unless your care team tells you to stop. If you use a pen, be sure to take off the outer needle cover before using the dose. It is important that you put your used needles and syringes in a special sharps container. Do not put them in a trash can. If you do not have a sharps container, call your pharmacist or care team to get one. A special MedGuide will be given to you by the pharmacist with each prescription and refill. Be sure to read this information carefully each time. This medication comes with INSTRUCTIONS FOR USE. Ask your pharmacist for directions on how to use this medication. Read the information carefully. Talk to your pharmacist or care team if you have questions. Talk to your care team about the use of this medication in children.  Special care may be needed. Overdosage: If you think you have taken too much of this medicine contact a poison control center or emergency room at once. NOTE: This medicine is only for you. Do not share this medicine with others. What if I miss a dose? It is important not to miss any doses. Talk to your care team about what to do if you miss a dose. What may interact with this medication? Do not take this medication with any of the following: Live vaccines This list may not describe all possible interactions. Give your health care provider a list of all the medicines, herbs, non-prescription drugs, or dietary supplements you use. Also tell them if you smoke, drink alcohol, or use illegal drugs. Some items may interact with your medicine. What should I watch for while using this medication? Visit your care team for regular checks on your progress. Tell your care team if your symptoms do not start to get better or if they get worse. You will be tested for tuberculosis (TB) before you start this medication. If your care team prescribes any medication for TB, you should start taking the TB medication before starting this medication. Make sure to finish the full course of TB medication. This medication may increase your risk of getting an infection. Call your care team for advice if you get a fever, chills, sore throat, or other symptoms of a cold or flu. Do not treat yourself. Try to avoid being around people who are sick. This medication can decrease the response to a vaccine. If you  need to get vaccinated, tell your care team if you have received this medication. Extra booster doses may be needed. Talk to your care team to see if a different vaccination schedule is needed. What side effects may I notice from receiving this medication? Side effects that you should report to your care team as soon as possible: Allergic reactions--skin rash, itching, hives, swelling of the face, lips, tongue, or  throat Infection--fever, chills, cough, sore throat, wounds that don't heal, pain or trouble when passing urine, general feeling of discomfort or being unwell Liver injury--right upper belly pain, loss of appetite, nausea, light-colored stool, dark yellow or brown urine, yellowing skin or eyes, unusual weakness or fatigue Side effects that usually do not require medical attention (report to your care team if they continue or are bothersome): Fatigue Headache Pain, redness, or irritation at injection site Runny or stuffy nose Sore throat This list may not describe all possible side effects. Call your doctor for medical advice about side effects. You may report side effects to FDA at 1-800-FDA-1088. Where should I keep my medication? Keep out of the reach of children and pets. Store in a refrigerator. Do not freeze. Protect from light. Keep it in the original carton until you are ready to take it. See product for storage information. Each product may have different instructions. Remove the dose from the carton about 30 to 45 minutes before it is time for you to take it. Get rid of any unused medication after the expiration date. To get rid of medications that are no longer needed or have expired: Take the medication to a medication take-back program. Check with your pharmacy or law enforcement to find a location. If you cannot return the medication, ask your pharmacist or care team how to get rid of this medication safely. NOTE: This sheet is a summary. It may not cover all possible information. If you have questions about this medicine, talk to your doctor, pharmacist, or health care provider.  2024 Elsevier/Gold Standard (2023-04-29 00:00:00)

## 2023-07-18 NOTE — Progress Notes (Signed)
Diagnosis: Crohn's Disease  Provider:  Chilton Greathouse MD  Procedure: IV Infusion  IV Type: Peripheral, IV Location: L Forearm  Skyrizi (risankizumab-rzaa), Dose: 600 mg  Infusion Start Time: 1342  Infusion Stop Time: 1455  Post Infusion IV Care: Observation period completed and Peripheral IV Discontinued  Discharge: Condition: Stable, Destination: Home . AVS Provided  Performed by:  Wyvonne Lenz, RN

## 2023-07-19 ENCOUNTER — Encounter: Payer: Self-pay | Admitting: Gastroenterology

## 2023-07-19 ENCOUNTER — Ambulatory Visit: Payer: Medicare Other | Admitting: Gastroenterology

## 2023-07-19 ENCOUNTER — Other Ambulatory Visit (INDEPENDENT_AMBULATORY_CARE_PROVIDER_SITE_OTHER): Payer: Medicare Other

## 2023-07-19 VITALS — BP 114/66 | HR 80 | Ht 62.0 in | Wt 139.0 lb

## 2023-07-19 DIAGNOSIS — K50818 Crohn's disease of both small and large intestine with other complication: Secondary | ICD-10-CM

## 2023-07-19 DIAGNOSIS — K862 Cyst of pancreas: Secondary | ICD-10-CM

## 2023-07-19 DIAGNOSIS — R5382 Chronic fatigue, unspecified: Secondary | ICD-10-CM

## 2023-07-19 LAB — COMPREHENSIVE METABOLIC PANEL
ALT: 47 U/L — ABNORMAL HIGH (ref 0–35)
AST: 48 U/L — ABNORMAL HIGH (ref 0–37)
Albumin: 4.4 g/dL (ref 3.5–5.2)
Alkaline Phosphatase: 104 U/L (ref 39–117)
BUN: 19 mg/dL (ref 6–23)
CO2: 29 meq/L (ref 19–32)
Calcium: 9.8 mg/dL (ref 8.4–10.5)
Chloride: 100 meq/L (ref 96–112)
Creatinine, Ser: 0.66 mg/dL (ref 0.40–1.20)
GFR: 92.26 mL/min (ref >60.00)
Glucose, Bld: 183 mg/dL — ABNORMAL HIGH (ref 70–99)
Potassium: 4.4 meq/L (ref 3.5–5.1)
Sodium: 138 meq/L (ref 135–145)
Total Bilirubin: 0.4 mg/dL (ref 0.2–1.2)
Total Protein: 7.3 g/dL (ref 6.0–8.3)

## 2023-07-19 LAB — CBC WITH DIFFERENTIAL/PLATELET
Basophils Absolute: 0.1 10*3/uL (ref 0.0–0.1)
Basophils Relative: 0.7 % (ref 0.0–3.0)
Eosinophils Absolute: 0.2 10*3/uL (ref 0.0–0.7)
Eosinophils Relative: 2.3 % (ref 0.0–5.0)
HCT: 44.7 % (ref 36.0–46.0)
Hemoglobin: 14.9 g/dL (ref 12.0–15.0)
Lymphocytes Relative: 28.7 % (ref 12.0–46.0)
Lymphs Abs: 3 10*3/uL (ref 0.7–4.0)
MCHC: 33.3 g/dL (ref 30.0–36.0)
MCV: 90.3 fL (ref 78.0–100.0)
Monocytes Absolute: 0.8 10*3/uL (ref 0.1–1.0)
Monocytes Relative: 7.6 % (ref 3.0–12.0)
Neutro Abs: 6.4 10*3/uL (ref 1.4–7.7)
Neutrophils Relative %: 60.7 % (ref 43.0–77.0)
Platelets: 374 10*3/uL (ref 150.0–400.0)
RBC: 4.95 Mil/uL (ref 3.87–5.11)
RDW: 14 % (ref 11.5–15.5)
WBC: 10.6 10*3/uL — ABNORMAL HIGH (ref 4.0–10.5)

## 2023-07-19 LAB — IBC + FERRITIN
Ferritin: 39.5 ng/mL (ref 10.0–291.0)
Iron: 121 ug/dL (ref 42–145)
Saturation Ratios: 24.8 % (ref 20.0–50.0)
TIBC: 488.6 ug/dL — ABNORMAL HIGH (ref 250.0–450.0)
Transferrin: 349 mg/dL (ref 212.0–360.0)

## 2023-07-19 LAB — HIGH SENSITIVITY CRP: CRP, High Sensitivity: 5.86 mg/L — ABNORMAL HIGH (ref 0.000–5.000)

## 2023-07-19 LAB — B12 AND FOLATE PANEL
Folate: >25.2 ng/mL (ref >5.9)
Vitamin B-12: 465 pg/mL (ref 211–911)

## 2023-07-19 LAB — VITAMIN D 25 HYDROXY (VIT D DEFICIENCY, FRACTURES): VITD: 36.86 ng/mL (ref 30.00–100.00)

## 2023-07-19 MED ORDER — SUFLAVE 178.7 G PO SOLR: 1.0000 | Freq: Once | ORAL | 0 refills | Status: AC

## 2023-07-19 NOTE — Patient Instructions (Addendum)
   You will receive your bowel preparation through [facility], which ensures the lowest copay and home delivery, with outreach via text or call from an [phone] number. Please respond promptly to avoid rescheduling. If you are interested in alternative options or have any questions please contact them at [phone]  Your Provider Has Sent Your Bowel Prep Regimen To [facility] What to expect. [facility] will contact you to verify your information and collect your copay, if applicable. Enjoy the comfort of your home while we deliver your prescription to you, free of any shipping charges. Fast, FREE delivery or shipping. [facility] accepts all major insurance benefits and applies discounts & coupons  Have additional questions? [facility]'s patient care team is always here to help.  Chat: [email or web address] Call: [phone] Email: [email or web address] NCPDP: [id number] How will we contact you? Welcome Phone call  a Welcome text and a Checkout link in a text Texts you receive from [phone] Are Not Spam.   *To set up delivery, you must complete the checkout process via link or speak to one of our patient care representatives. If we are unable to reach you, your prescription may be delayed.  To avoid waiting on hold if you call. Utilize the secure chat feature and request [facility] call you to complete the transaction or expedite your concerns.  YOUR PLAN:  -CROHN'S DISEASE: Crohn's disease is a chronic inflammatory condition of the gastrointestinal tract. You are currently on Skyrizi, which is expected to control inflammation and reduce symptoms. We will continue with Skyrizi, order blood work to check liver tests, blood counts, and inflammation markers, and schedule a colonoscopy for April 4th at 7 AM. We will also follow up on your recent CT scan results.  -DEGENERATIVE DISC DISEASE: Degenerative disc disease is a condition where the discs between the vertebrae of the spine  break down, leading to pain. You reported significant left-sided back pain and have an upcoming specialist appointment. We will check your CBC. CMP, CRP, iron, B12, folate, and vitamin D levels, and consider a higher dose of vitamin D if your levels are low.  -PANCREATIC CYSTS: Pancreatic cysts are fluid-filled sacs within the pancreas that are usually benign. These cysts will be monitored with periodic scans, with the next scan planned in approximately one year.  -GENERAL HEALTH MAINTENANCE: Routine health maintenance includes monitoring your vitamin levels and avoiding steroids due to blood sugar concerns. We will perform blood work to check your iron, B12, folate, and vitamin D levels.  INSTRUCTIONS:  - Follow up on CT scan results - Schedule colonoscopy for April 4th at 7 AM - Perform blood work today - Monitor pancreatic cysts with periodic scans, next scan in approximately one year.  Your provider has requested that you go to the basement level for lab work before leaving today. Press "B" on the elevator. The lab is located at the first door on the left as you exit the elevator.   I appreciate the  opportunity to care for you  Thank You   [name] , MD

## 2023-07-19 NOTE — Progress Notes (Signed)
 [name]    [id number]    1961/03/10  Primary Care Physician:Crawford, Austin Miles, MD  Referring Physician: Myrlene Broker, MD [address] [place],  [place] [address]   Chief complaint:  Crohn's disease, nausea  Discussed the use of AI scribe software for clinical note transcription with the patient, who gave verbal consent to proceed.  History of Present Illness   [name] is a 63 year old female with Crohn's disease who presents for follow-up and management of her condition.  She is currently undergoing treatment for Crohn's disease with Skyrizi, having received the full induction dose yesterday. She is not taking any steroids or other medications for Crohn's at this time. She feels tired and experiences back pain, particularly on the left side. Her bowel movements occur at least twice a day, with no constipation or blood in her stool. She experienced watery stools, possibly related to a recent CT scan, but this has resolved. A biopsy of a lymph node was negative for tumor and infection, indicating reactive lymph nodes. Results of a CT scan performed last week are pending. A colonoscopy in April 2023 revealed a precancerous polyp, which was removed.  She has a history of degenerative disc disease, contributing to her back pain, and is scheduled to see a doctor for this condition. There is concern for vitamin D deficiency, which could exacerbate her disc disease and contribute to her fatigue. Her current medication regimen includes Skyrizi, with infusions occurring monthly.  She has pancreatic cysts that are being monitored. These cysts are typically benign and do not usually grow, but they will be monitored with periodic scans.  There is a plan to check various vitamin levels to address her fatigue, which could be related to deficiencies.     LYMPH NODE, BIOPSY:  Benign reactive lymph node with focal epithelioid histiocytic  aggregates.  No fungal  elements identified on GMS stain.  No acid fast microorganisms identified on AFB stain.  Negative for lymphoproliferative disorder or malignancy.     CT abd & pelvis 06/08/2023 1. Prominent/mildly enlarged left iliac side chain, pelvic sidewall and inguinal lymph nodes, nonspecific, possibly reactive versus lymphoproliferative process. Consider correlation with laboratory values follow-up CT abdomen pelvis in 3 months to assess for stability or resolution. A more aggressive approach would include ultrasound-guided fine-needle aspiration of the dominant left inguinal lymph node. 2. Diffuse hepatic steatosis. 3. Scattered colonic diverticulosis without findings of acute diverticulitis. 4.  Aortic Atherosclerosis (ICD10-I70.0).  Colonoscopy 09/23/21: - One 5 mm polyp in the ascending colon, removed with a cold snare. Resected and retrieved. - Simple Endoscopic Score for Crohn's Disease: 4, mucosal inflammatory changes secondary to Crohn's disease with ileitis. Biopsied. - Diverticulosis in the sigmoid colon. - Non-bleeding external and internal hemorrhoids. 2. Surgical [P], colon, ascending, polyp (1) TUBULAR ADENOMA NEGATIVE FOR HIGH-GRADE DYSPLASIA AND CARCINOMA   Endoscopy 09/23/21: - The Z-line was regular and was found 35 cm from the incisors. - A small hiatal hernia was present. - The stomach was normal. - The cardia and gastric fundus were normal on retroflexion. - The examined duodenum was normal. Pathology 09/23/21: 1. Surgical [P], small bowel, terminal ileum MINIMAL ACUTE ILEITIS (SEE MICROSCOPIC COMMENT) CRP elevated to 55,000 in May 2022 and was persistently elevated on Entyvio and budesonide   Colonoscopy December 05, 2018: Terminal ileal biopsies consistent with Crohn's disease.  IC valve polypoid lesion status post EMR, biopsies consistent with IBD  negative for dysplasia or adenomatous tissue 5 mm polyp, tubular adenoma removed from transverse colon otherwise normal colon on  chromoendoscopy   Colonoscopy July 07, 2018 Nodular TI biopsied Localized nodular polypoid mucosa in IC valve biopsied, ?  Adenoma on pathology report, requested additional review is pending.   EGD 08/22/2009: Esophagitis otherwise unremarkable exam      MRI pelvis February 14, 2018 showed grade 1 simple linear intersphincteric perianal fistula without associated abscess.  Mild segmental wall thickening and luminal narrowing in the distal ileum suggestive of mild active Crohn's ileitis.  Mild chronic wall thickening in the terminal ileum without active inflammatory changes.    Crohn's disease with predominant small bowel involvement diagnosed in 1991.  Initially was managed with prednisone, had significant side effects.  Subsequently was in clinical remission on 6-MP.  6-MP was discontinued in 2017 due to persistently elevated transaminases, have improved after discontinuing 6-MP.  She was started on Humira, clinically failed with persistent symptoms.  Had undetectable drug trough with elevated antibodies.  Started on budesonide 9 mg daily and taper down to 3 mg daily in 2018.  Developed rectal pain and noted to have perianal fistula on MRI pelvis September 2019.   Relevant other PMH She had NSTEMI in November 2018, status post coronary stent placement (DES)  Outpatient Encounter Medications as of 07/19/2023  Medication Sig   acetaminophen (TYLENOL) 650 MG CR tablet Take 650 mg by mouth every 8 (eight) hours as needed for pain.   albuterol (PROVENTIL) (2.5 MG/3ML) 0.083% nebulizer solution Take 2.5 mg by nebulization every 4 (four) hours as needed for wheezing or shortness of breath.    albuterol (VENTOLIN HFA) 108 (90 Base) MCG/ACT inhaler Inhale 1-2 puffs into the lungs every 6 (six) hours as needed for wheezing or shortness of breath.   aspirin EC 81 MG tablet Take 81 mg by mouth daily. Swallow whole.   Blood Glucose Monitoring Suppl (FREESTYLE LITE) w/Device KIT Use to check blood sugar  up to 4 times a day as directed   budesonide (ENTOCORT EC) 3 MG 24 hr capsule Take 3 capsules (9 mg total) by mouth daily.   colestipol (COLESTID) 1 g tablet Take 1 tablet (1 g total) by mouth daily.   Continuous Blood Gluc Sensor (FREESTYLE LIBRE 14 DAY SENSOR) MISC Use to monitor sugars   Continuous Blood Gluc Sensor (FREESTYLE LIBRE SENSOR SYSTEM) MISC Use for blood sugar monitoring   cyanocobalamin (VITAMIN B12) 1000 MCG/ML injection Inject 1 mL (1,000 mcg total) into the muscle every 30 (thirty) days.   dicyclomine (BENTYL) 20 MG tablet Take 1 tablet (20 mg total) by mouth every 6 (six) hours.   DULoxetine (CYMBALTA) 60 MG capsule Take 1 capsule (60 mg total) by mouth daily.   empagliflozin (JARDIANCE) 25 MG TABS tablet Take 1 tablet (25 mg total) by mouth daily before breakfast.   enalapril (VASOTEC) 10 MG tablet Take 1 tablet (10 mg) by mouth daily.   ergocalciferol (VITAMIN D2) 1.25 MG (50000 UT) capsule Take 1 capsule (50,000 Units total) by mouth once a week.   Evolocumab (REPATHA SURECLICK) 140 MG/ML SOAJ Inject 140 mg into the skin every 14 (fourteen) days.   fenofibrate (TRICOR) 145 MG tablet TAKE ONE TABLET BY MOUTH ONCE DAILY   glimepiride (AMARYL) 2 MG tablet Take 1 tablet (2 mg total) by mouth daily before breakfast.   glucose blood (TRUE METRIX BLOOD GLUCOSE TEST) test strip Use to check blood sugar up to 4 times a day as directed.  Insulin Pen Needle (PEN NEEDLES 3/16") 31G X 5 MM MISC Use daily   Lancets (FREESTYLE) lancets Use to check blood sugar up to 4 times a day as directed   LANTUS SOLOSTAR 100 UNIT/ML Solostar Pen Inject 58 Units into the skin daily.   levocetirizine (XYZAL) 5 MG tablet Take 1 tablet  by mouth every evening.   metFORMIN (GLUCOPHAGE-XR) 500 MG 24 hr tablet Take 2 tablets by mouth daily.   metoprolol succinate (TOPROL-XL) 25 MG 24 hr tablet Take 1.5 tablets (37.5 mg total) by mouth daily.   montelukast (SINGULAIR) 10 MG tablet Take 1 tablet (10 mg  total) by mouth every evening.   Multiple Vitamins-Minerals (MULTIVITAMIN WITH MINERALS) tablet Take 1 tablet by mouth daily.   pantoprazole (PROTONIX) 40 MG tablet Take 1 tablet (40 mg total) by mouth daily.   Probiotic Product (PROBIOTIC DAILY PO) Take 1 capsule by mouth daily.   SKYRIZI 360 MG/2.4ML SOCT 360 mg SQ injection by cartridge and repeat every 8 weeks   sodium chloride (OCEAN) 0.65 % SOLN nasal spray Place 1 spray into both nostrils daily as needed for congestion.   sucralfate (CARAFATE) 1 g tablet Take 1 tablet (1 g total) by mouth 4 (four) times daily -  with meals and at bedtime.   [EXPIRED] SUFLAVE 178.7 g SOLR Take 1 kit by mouth once for 1 dose.   VASCEPA 1 g capsule Take 2 capsules by mouth 2 times daily.   Vitamin D3 (VITAMIN D) 25 MCG tablet Take 1,000 Units by mouth at bedtime.   furosemide (LASIX) 40 MG tablet Take 1 tablet (40 mg total) by mouth daily as needed for fluid or edema. (Patient not taking: Reported on 07/19/2023)   nitroGLYCERIN (NITROSTAT) 0.4 MG SL tablet Place 1 tablet (0.4 mg total) under the tongue every 5 (five) minutes as needed for chest pain.  Call 911 if no relief after 1st dose. (Patient not taking: Reported on 07/19/2023)   ondansetron (ZOFRAN ODT) 4 MG disintegrating tablet Take 1 tablet (4 mg total) by mouth every 4 (four) hours as needed for nausea or vomiting. (Patient not taking: Reported on 07/19/2023)   [DISCONTINUED] EPIPEN 2-PAK 0.3 MG/0.3ML SOAJ injection Inject 0.3 mg into the muscle as needed for anaphylaxis. Reported on 06/27/2015   [DISCONTINUED] insulin glargine (LANTUS) 100 UNIT/ML injection Inject 0.48 mLs (48 Units total) into the skin at bedtime.   [DISCONTINUED] insulin glargine-yfgn (SEMGLEE, YFGN,) 100 UNIT/ML Pen Inject 58 Units into the skin daily.   No facility-administered encounter medications on file as of 07/19/2023.    Allergies as of 07/19/2023 - Review Complete 07/19/2023  Allergen Reaction Noted   Lipitor  [atorvastatin] Other (See Comments) 03/28/2013   Remicade [infliximab]     Ticagrelor  09/16/2017   Rosuvastatin Other (See Comments) 01/25/2019    Past Medical History:  Diagnosis Date   Allergic rhinitis due to pollen    Allergy    Anxiety state, unspecified    Arthritis    Bronchitis    hx   CAD (coronary artery disease)    s/p NSTEMI in 11/18 treated with a DES to the proximal LAD   Calculus of gallbladder without mention of cholecystitis or obstruction    Crohn disease (HCC)    Depressive disorder, not elsewhere classified    Diabetes mellitus without complication (HCC)    diet controlled   Esophageal reflux    Fibromyalgia    Grade I diastolic dysfunction    Noted on ECHO  Headache    History of nuclear stress test    ETT-Myoview 10/17: EF 55%, normal perfusion; Low Risk // Nuclear stress test 05/2018:  EF 63, normal perfusion; Low Risk   Myalgia and myositis, unspecified    NSTEMI (non-ST elevated myocardial infarction) (HCC) 04/15/2017   Other and unspecified hyperlipidemia    Palpitations    Pneumonia 13   hx   S/P angioplasty with stent 04/18/17 with DES to pLAD 04/19/2017   Seizures (HCC)    1 seizure as a teenager    Unspecified essential hypertension    Vitamin B deficiency     Past Surgical History:  Procedure Laterality Date   BIOPSY  12/05/2018   Procedure: BIOPSY;  Surgeon: Napoleon Form, MD;  Location: [facility];  Service: Endoscopy;;   CHOLECYSTECTOMY N/A 09/19/2014   Procedure: LAPAROSCOPIC CHOLECYSTECTOMY WITH INTRAOPERATIVE CHOLANGIOGRAM;  Surgeon: Manus Rudd, MD;  Location: [facility] OR;  Service: General;  Laterality: N/A;   COLONOSCOPY WITH PROPOFOL N/A 12/05/2018   Procedure: COLONOSCOPY WITH PROPOFOL;  Surgeon: Napoleon Form, MD;  Location: [facility];  Service: Endoscopy;  Laterality: N/A;  chromoendoscopy   CORONARY PRESSURE/FFR STUDY N/A 04/18/2017   Procedure: INTRAVASCULAR PRESSURE WIRE/FFR STUDY;  Surgeon: Swaziland, Peter M, MD;   Location: [facility] INVASIVE CV LAB;  Service: Cardiovascular;  Laterality: N/A;   CORONARY STENT INTERVENTION N/A 04/18/2017   Procedure: CORONARY STENT INTERVENTION;  Surgeon: Swaziland, Peter M, MD;  Location: [facility] INVAS[facility] CV LAB;  Service: Cardiovascular;  Laterality: N/A;   ESOPHAGOGASTRODUODENOSCOPY     EXPLORATORY LAPAROTOMY     for endometriosis   LEFT HEART CATH AND CORONARY ANGIOGRAPHY N/A 04/18/2017   Procedure: LEFT HEART CATH AND CORONARY ANGIOGRAPHY;  Surgeon: Swaziland, Peter M, MD;  Location: [facility] INVASIVE CV LAB;  Service: Cardiovascular;  Laterality: N/A;   POLYPECTOMY  12/05/2018   Procedure: POLYPECTOMY;  Surgeon: Napoleon Form, MD;  Location: [facility];  Service: Endoscopy;;   SUBMUCOSAL LIFTING INJECTION  12/05/2018   Procedure: SUBMUCOSAL LIFTING INJECTION;  Surgeon: Napoleon Form, MD;  Location: [facility];  Service: Endoscopy;;   TONSILLECTOMY AND ADENOIDECTOMY     TUBAL LIGATION     BILATERAL    Family History  Problem Relation Age of Onset   Lung cancer Maternal Grandmother    Stroke Father    Hypertension Mother    Heart attack Mother    Heart disease Mother    Hypertension Brother    Heart disease Brother    Hypertension Sister    Heart disease Sister    Coronary artery disease Brother    Colon cancer Neg Hx    Stomach cancer Neg Hx    Esophageal cancer Neg Hx    Rectal cancer Neg Hx     Social History   Socioeconomic History   Marital status: Married    Spouse name: Dane   Number of children: 2   Years of education: 12+   Highest education level: Not on file  Occupational History   Occupation: ACCOUNT REP.    Employer: [employer]    Comment: retired  Tobacco Use   Smoking status: Former    Current packs/day: 0.00    Average packs/day: 1 pack/day for 35.0 years (35.0 ttl pk-yrs)    Types: Cigarettes    Start date: 05/31/1969    Quit date: 05/31/2004    Years since quitting: 19.1   Smokeless tobacco: Never  Vaping Use    Vaping status: Never Used  Substance and  Sexual Activity   Alcohol use: No   Drug use: No   Sexual activity: Not Currently    Comment: married  Other Topics Concern   Not on file  Social History Narrative   Lives with her husband and their pets. Her children are adults and live independently.   Social Drivers of Corporate investment banker Strain: Not on file  Food Insecurity: No Food Insecurity (04/06/2022)   Hunger Vital Sign    Worried About Running Out of Food in the Last Year: Never true    Ran Out of Food in the Last Year: Never true  Transportation Needs: No Transportation Needs (04/06/2022)   PRAPARE - Administrator, Civil Service (Medical): No    Lack of Transportation (Non-Medical): No  Physical Activity: Not on file  Stress: Not on file  Social Connections: Not on file  Intimate Partner Violence: Not on file      Review of systems: All other review of systems negative except as mentioned in the HPI.   Physical Exam: Vitals:   07/19/23 0947  BP: 114/66  Pulse: 80   Body mass index is 25.42 kg/m. Gen:      No acute distress HEENT:  sclera anicteric CV: s1s2 rrr, no murmur Lungs: B/l clear. Abd:      soft, non-tender; no palpable masses, no distension Ext:    No edema Neuro: alert and oriented x 3 Psych: normal mood and affect  Data Reviewed:  Reviewed labs, radiology imaging, old records and pertinent past GI work up     Assessment and Plan    Crohn's Disease Currently on [employer]. Biopsies of lymph nodes were negative for tumor and infection, indicating reactive lymph nodes. Awaiting recent CT scan results. Reports 2-3 bowel movements daily without constipation or blood. Recent episode of watery stool likely due to CT contrast, now resolved. Colonoscopy planned to assess inflammation control and follow-up on previous pre-cancerous polyp removal. Discussed avoiding steroids due to blood sugar concerns and potential side effects. Skyrizi  expected to control inflammation and reduce symptoms. - Continue Skyrizi - Order blood work to check liver tests, blood counts, and inflammation markers - Schedule colonoscopy for April 4th at 7 AM - Follow up on CT scan results  Degenerative Disc Disease Reports significant left-sided back pain. Upcoming specialist appointment. Vitamin D deficiency may contribute to osteopenia or osteoporosis, exacerbating disc disease. Discussed need for higher dose vitamin D if levels are low. - Check iron, B12, folate, and vitamin D levels - Consider higher dose vitamin D if levels are low  Pancreatic Cysts Pancreatic cysts require monitoring. No immediate intervention needed as most cysts do not grow significantly. Next scan planned in approximately one year. - Monitor cysts with periodic scans, next scan in approximately one year  General Health Maintenance Routine health maintenance discussed, including monitoring vitamin levels and avoiding steroids due to blood sugar concerns. - Avoid steroids due to blood sugar concerns - Perform blood work to check iron, B12, folate, and vitamin D levels  Follow-up - Follow up on CT scan results - Schedule colonoscopy for April 4th at 7 AM - Perform blood work today - Monitor pancreatic cysts with periodic scans, next scan in approximately one year.          This visit required 40 minutes of patient care (this includes precharting, chart review, review of results, face-to-face time used for counseling as well as treatment plan and follow-up. The patient was provided an opportunity  to ask questions and all were answered. The patient agreed with the plan and demonstrated an understanding of the instructions.  Iona Beard , MD    CC: Myrlene Broker, *

## 2023-07-25 ENCOUNTER — Encounter: Payer: Self-pay | Admitting: Gastroenterology

## 2023-07-26 ENCOUNTER — Other Ambulatory Visit: Payer: Self-pay | Admitting: Gastroenterology

## 2023-07-29 DIAGNOSIS — H2513 Age-related nuclear cataract, bilateral: Secondary | ICD-10-CM | POA: Diagnosis not present

## 2023-07-29 DIAGNOSIS — H35363 Drusen (degenerative) of macula, bilateral: Secondary | ICD-10-CM | POA: Diagnosis not present

## 2023-07-29 DIAGNOSIS — H52223 Regular astigmatism, bilateral: Secondary | ICD-10-CM | POA: Diagnosis not present

## 2023-07-29 DIAGNOSIS — H524 Presbyopia: Secondary | ICD-10-CM | POA: Diagnosis not present

## 2023-07-29 DIAGNOSIS — E119 Type 2 diabetes mellitus without complications: Secondary | ICD-10-CM | POA: Diagnosis not present

## 2023-07-29 DIAGNOSIS — H5203 Hypermetropia, bilateral: Secondary | ICD-10-CM | POA: Diagnosis not present

## 2023-08-01 ENCOUNTER — Telehealth: Payer: Self-pay | Admitting: Nurse Practitioner

## 2023-08-01 ENCOUNTER — Telehealth: Payer: Self-pay | Admitting: Internal Medicine

## 2023-08-01 MED ORDER — ENALAPRIL MALEATE 10 MG PO TABS: 10.0000 mg | ORAL_TABLET | Freq: Every day | ORAL | 0 refills | Status: DC

## 2023-08-01 MED ORDER — FENOFIBRATE 145 MG PO TABS: 145.0000 mg | ORAL_TABLET | Freq: Every day | ORAL | 0 refills | Status: DC

## 2023-08-01 NOTE — Telephone Encounter (Signed)
*  STAT* If patient is at the pharmacy, call can be transferred to refill team.   1. Which medications need to be refilled? (please list name of each medication and dose if known)  fenofibrate (TRICOR) 145 MG tablet  enalapril (VASOTEC) 10 MG tablet    4. Which pharmacy/location (including street and city if local pharmacy) is medication to be sent to?   [id number] - [place], [place] - [address] Phone: [phone]  Fax: [phone]       5. Do they need a 30 day or 90 day supply? 90     Pt scheduled to see Dr. Odis Hollingshead 08/25/23

## 2023-08-01 NOTE — Telephone Encounter (Signed)
 Copied from CRM [id number]. Topic: General - Other >> Aug 01, 2023 12:17 PM [place] E wrote: Reason for CRM: Patient called in asking if the clinic as received a fax that was sent over by her pharmacy regarding her glimepiride (AMARYL) 2 MG tablet medication. Agent looked under media tab and could not locate the fax if it was sent over. Callback number for patient is [phone] for confirmation.

## 2023-08-02 ENCOUNTER — Other Ambulatory Visit: Payer: Self-pay

## 2023-08-02 ENCOUNTER — Encounter: Payer: Self-pay | Admitting: Gastroenterology

## 2023-08-02 MED ORDER — GLIMEPIRIDE 2 MG PO TABS: 2.0000 mg | ORAL_TABLET | Freq: Every day | ORAL | 0 refills | Status: DC

## 2023-08-02 NOTE — Telephone Encounter (Signed)
 Done

## 2023-08-03 ENCOUNTER — Other Ambulatory Visit: Payer: Self-pay | Admitting: Internal Medicine

## 2023-08-03 NOTE — Telephone Encounter (Signed)
 Copied from CRM [id number]. Topic: Clinical - Medication Refill >> Aug 03, 2023  3:16 PM Sim Boast F wrote: Most Recent Primary Care Visit:  Provider: Hillard Danker A  Department: [facility]  Visit Type: OFFICE VISIT  Date: 04/19/2023  Medication: Insulin Pen Needle (PEN NEEDLES 3/16") 31G X 5 MM MISC and test strips   Has the patient contacted their pharmacy? Yes (Agent: If no, request that the patient contact the pharmacy for the refill. If patient does not wish to contact the pharmacy document the reason why and proceed with request.) (Agent: If yes, when and what did the pharmacy advise?)  Is this the correct pharmacy for this prescription? Yes If no, delete pharmacy and type the correct one.  This is the patient's preferred pharmacy:   [id number] - [place], [place] - [address] [place] [address] Phone: [phone] Fax: [phone]   Has the prescription been filled recently? Yes  Is the patient out of the medication? No  Has the patient been seen for an appointment in the last year OR does the patient have an upcoming appointment? Yes  Can we respond through MyChart? Yes  Agent: Please be advised that Rx refills may take up to 3 business days. We ask that you follow-up with your pharmacy.

## 2023-08-05 ENCOUNTER — Encounter: Payer: Self-pay | Admitting: Gastroenterology

## 2023-08-05 MED ORDER — TRUE METRIX BLOOD GLUCOSE TEST VI STRP: ORAL_STRIP | 3 refills | Status: DC

## 2023-08-08 ENCOUNTER — Other Ambulatory Visit: Payer: Self-pay | Admitting: Internal Medicine

## 2023-08-08 NOTE — Telephone Encounter (Signed)
 Copied from CRM [id number]. Topic: Clinical - Medication Refill >> Aug 08, 2023  1:33 PM Alcus Dad wrote: Most Recent Primary Care Visit:  Provider: Hillard Danker A  Department: [facility]  Visit Type: OFFICE VISIT  Date: 04/19/2023  Medication: glucose blood (TRUE METRIX BLOOD GLUCOSE TEST) test strip Insulin Pen Needle (PEN NEEDLES 3/16") 31G X 5 MM MISC  Has the patient contacted their pharmacy? Yes (Agent: If no, request that the patient contact the pharmacy for the refill. If patient does not wish to contact the pharmacy document the reason why and proceed with request.) (Agent: If yes, when and what did the pharmacy advise?)  Is this the correct pharmacy for this prescription? Yes If no, delete pharmacy and type the correct one.  This is the patient's preferred pharmacy:   [id number] - [place], [place] - [address] [place] [address] Phone: [phone] Fax: [phone]   Has the prescription been filled recently? No  Is the patient out of the medication? Yes  Has the patient been seen for an appointment in the last year OR does the patient have an upcoming appointment? Yes  Can we respond through MyChart? Yes  Agent: Please be advised that Rx refills may take up to 3 business days. We ask that you follow-up with your pharmacy.

## 2023-08-11 ENCOUNTER — Encounter: Payer: Self-pay | Admitting: Gastroenterology

## 2023-08-15 ENCOUNTER — Ambulatory Visit: Payer: Medicare Other

## 2023-08-16 ENCOUNTER — Telehealth: Payer: Self-pay

## 2023-08-16 NOTE — Telephone Encounter (Signed)
 Auth Submission: APPROVED Site of care: Site of care: [facility] Payer: [employer] Medication & CPT/J Code(s) submitted: Skyrizi Merlyn Albert) [phone] Route of submission (phone, fax, portal): fax Phone # Fax # Auth type: Buy/Bill PB Units/visits requested: 600mg  x 2 doses Reference number: [id number] Approval from: 08/15/23 to 08/14/24

## 2023-08-18 ENCOUNTER — Other Ambulatory Visit: Payer: Self-pay | Admitting: Internal Medicine

## 2023-08-18 ENCOUNTER — Telehealth: Payer: Self-pay | Admitting: Gastroenterology

## 2023-08-18 ENCOUNTER — Other Ambulatory Visit: Payer: Self-pay

## 2023-08-18 DIAGNOSIS — I25118 Atherosclerotic heart disease of native coronary artery with other forms of angina pectoris: Secondary | ICD-10-CM

## 2023-08-18 MED ORDER — METOPROLOL SUCCINATE ER 25 MG PO TB24
37.5000 mg | ORAL_TABLET | Freq: Every day | ORAL | 0 refills | Status: DC
Start: 2023-08-18 — End: 2023-11-28

## 2023-08-18 MED ORDER — TRUE METRIX BLOOD GLUCOSE TEST VI STRP: ORAL_STRIP | 3 refills | Status: DC

## 2023-08-18 NOTE — Telephone Encounter (Signed)
 [facility] rep named Casimiro Needle called and stated that this patient is requesting that we refill her pantoprazole and colestipol for a 90 day supply. [facility] rep stated that they had faxed over a request to [place] already but have not received a response. [facility] provided me with a number [phone] and their fax number is [phone].Please advise.

## 2023-08-18 NOTE — Telephone Encounter (Signed)
 I did fax the authorization for the 90 day supplies to this company [facility] I even verified the number and have the  confirmation fax. But I will call the pharmacy directly    Called the pharmacy and the representative looked on the fax and she seen the faxes I sent yesterday. She will process them. Called the patient back , had to leave a voicemail,and explained to her that they do have her prescriptions

## 2023-08-18 NOTE — Telephone Encounter (Signed)
 Pharmacy requested One Touch Test Strips (new Rx needed), Semglee insulin (discontinued 01/11/23), pen needles (unable to select reorder, new Rx needed)

## 2023-08-18 NOTE — Telephone Encounter (Signed)
 Copied from CRM [id number]. Topic: Clinical - Medication Question >> Aug 18, 2023 11:09 AM Orinda Kenner C wrote: Reason for CRM: Casimiro Needle from [employer] [phone] checking on status of medications refills 90 days supply was faxed 08/12/23 and 08/16/23. Fax# [phone]. Has not heard from the office. Please advise and call back.   1. empagliflozin (JARDIANCE) 25 MG TABS tablet,  2. enalapril (VASOTEC) 10 MG tablet,  3. Evolocumab (REPATHA SURECLICK) 140 MG/ML SOAJ,  4. Vitamin D2 1.25mg ,  5. montelukast (SINGULAIR) 10 MG tablet,  6. glimepiride (AMARYL) 2 MG tablet,  7. levocetirizine (XYZAL) 5 MG tablet,  8. Semglee insulin 100 units,  9. metFORMIN (GLUCOPHAGE-XR) 500 MG 24 hr tablet,  10. One Touch test strips,  11. DULoxetine (CYMBALTA) 60 MG capsule, 12. Skyrizi 150 mg,  13. fenofibrate (TRICOR) 145 MG tablet,  14. metoprolol succinate (TOPROL-XL) 25 MG 24 hr tablet  15. LANTUS SOLOSTAR 100 UNIT/ML Solostar Pen  16. pantoprazole (PROTONIX) 40 MG tablet  17. colestipol (COLESTID) 1 gm tablet  18. BD Nano 2 generation pen needles 4 mm

## 2023-08-19 ENCOUNTER — Telehealth: Payer: Self-pay | Admitting: Internal Medicine

## 2023-08-19 NOTE — Telephone Encounter (Signed)
 Copied from CRM [id number]. Topic: Clinical - Home Health Verbal Orders >> Aug 19, 2023  1:10 PM Adele Barthel wrote: Caller/Agency: Jari Favre, [facility]  Callback Number: Phn # [phone], Fx# [phone] Service Requested: Rep states he faxed forms over regarding the patient's continuous glucose monitor on 03/18, 03/19, and today. They have not received signed form back and it is due today. Confirmed with [employer] the forms were not received. Advised they would watch the fax que for the one sent today.  Frequency: n/a Any new concerns about the patient? No

## 2023-08-19 NOTE — Telephone Encounter (Signed)
 I received another request on the same form for the same medications Pantoprazole and Colestipol . I called [facility] at [phone]. Spoke with the representative and she said they do have the prescriptions and they are in process and apologized for the many repeat of faxes

## 2023-08-19 NOTE — Telephone Encounter (Signed)
 Copied from CRM [id number]. Topic: General - Other >> Aug 19, 2023  1:33 PM Sim Boast F wrote: Reason for CRM: [facility] called to confirm that fax was received today and is being worked on

## 2023-08-19 NOTE — Telephone Encounter (Signed)
 Yes this has been received and placed inside office box

## 2023-08-23 ENCOUNTER — Ambulatory Visit

## 2023-08-23 ENCOUNTER — Encounter: Payer: Self-pay | Admitting: Gastroenterology

## 2023-08-23 NOTE — Telephone Encounter (Signed)
 Pt called in to check and see if a prescription order had been sent to [facility] for medical equipment. I advised the pt that the fax had been received from [facility] and was placed in the provider's box. I did Let the pt know form completion can take 3-5 business days and that she would receive a f/u call once the request has been processed

## 2023-08-24 ENCOUNTER — Other Ambulatory Visit: Payer: Self-pay | Admitting: Nurse Practitioner

## 2023-08-24 ENCOUNTER — Telehealth: Payer: Self-pay | Admitting: Internal Medicine

## 2023-08-24 MED ORDER — METFORMIN HCL ER 500 MG PO TB24: 1000.0000 mg | ORAL_TABLET | Freq: Every day | ORAL | 3 refills | Status: DC

## 2023-08-24 MED ORDER — DULOXETINE HCL 60 MG PO CPEP: 60.0000 mg | ORAL_CAPSULE | Freq: Every day | ORAL | 3 refills | Status: AC

## 2023-08-24 MED ORDER — EMPAGLIFLOZIN 25 MG PO TABS: 25.0000 mg | ORAL_TABLET | Freq: Every day | ORAL | 3 refills | Status: DC

## 2023-08-24 MED ORDER — LEVOCETIRIZINE DIHYDROCHLORIDE 5 MG PO TABS: 5.0000 mg | ORAL_TABLET | Freq: Every evening | ORAL | 3 refills | Status: AC

## 2023-08-24 MED ORDER — GLIMEPIRIDE 2 MG PO TABS: 2.0000 mg | ORAL_TABLET | Freq: Every day | ORAL | 0 refills | Status: DC

## 2023-08-24 MED ORDER — MONTELUKAST SODIUM 10 MG PO TABS
10.0000 mg | ORAL_TABLET | Freq: Every evening | ORAL | 3 refills | Status: AC
Start: 2023-08-24 — End: ?

## 2023-08-24 MED ORDER — REPATHA SURECLICK 140 MG/ML ~~LOC~~ SOAJ: 140.0000 mg | SUBCUTANEOUS | 3 refills | Status: DC

## 2023-08-24 NOTE — Telephone Encounter (Signed)
 Copied from CRM [id number]. Topic: Clinical - Prescription Issue >> Aug 24, 2023 12:27 PM Denese Killings wrote: Reason for CRM: Patient stated that her insurance plan will no longer cover her Lantus insulin and Libre 3. She states that she needs approval from the doctor so she can continue taking that medication or switch to another. They are giving her one more box and she will be completely Patient is requesting a callback from the nurse with an update. She has not heard anything and no one calls her back.  [employer] Member ID: [facility] Insurance number: [phone]

## 2023-08-25 ENCOUNTER — Encounter: Payer: Self-pay | Admitting: Cardiology

## 2023-08-25 ENCOUNTER — Other Ambulatory Visit: Payer: Self-pay | Admitting: Internal Medicine

## 2023-08-25 ENCOUNTER — Ambulatory Visit: Attending: Cardiology | Admitting: Cardiology

## 2023-08-25 VITALS — BP 132/72 | HR 72 | Resp 16 | Ht 62.0 in | Wt 138.6 lb

## 2023-08-25 DIAGNOSIS — I2510 Atherosclerotic heart disease of native coronary artery without angina pectoris: Secondary | ICD-10-CM

## 2023-08-25 DIAGNOSIS — N951 Menopausal and female climacteric states: Secondary | ICD-10-CM | POA: Diagnosis not present

## 2023-08-25 DIAGNOSIS — G720 Drug-induced myopathy: Secondary | ICD-10-CM | POA: Diagnosis not present

## 2023-08-25 DIAGNOSIS — E785 Hyperlipidemia, unspecified: Secondary | ICD-10-CM

## 2023-08-25 DIAGNOSIS — Z95820 Peripheral vascular angioplasty status with implants and grafts: Secondary | ICD-10-CM

## 2023-08-25 DIAGNOSIS — E118 Type 2 diabetes mellitus with unspecified complications: Secondary | ICD-10-CM

## 2023-08-25 DIAGNOSIS — T466X5D Adverse effect of antihyperlipidemic and antiarteriosclerotic drugs, subsequent encounter: Secondary | ICD-10-CM

## 2023-08-25 DIAGNOSIS — E1169 Type 2 diabetes mellitus with other specified complication: Secondary | ICD-10-CM | POA: Diagnosis not present

## 2023-08-25 DIAGNOSIS — Z124 Encounter for screening for malignant neoplasm of cervix: Secondary | ICD-10-CM | POA: Diagnosis not present

## 2023-08-25 DIAGNOSIS — Z01419 Encounter for gynecological examination (general) (routine) without abnormal findings: Secondary | ICD-10-CM | POA: Diagnosis not present

## 2023-08-25 DIAGNOSIS — E781 Pure hyperglyceridemia: Secondary | ICD-10-CM

## 2023-08-25 DIAGNOSIS — I10 Essential (primary) hypertension: Secondary | ICD-10-CM

## 2023-08-25 DIAGNOSIS — Z1231 Encounter for screening mammogram for malignant neoplasm of breast: Secondary | ICD-10-CM | POA: Diagnosis not present

## 2023-08-25 LAB — HM MAMMOGRAPHY

## 2023-08-25 MED ORDER — ENALAPRIL MALEATE 10 MG PO TABS: 10.0000 mg | ORAL_TABLET | Freq: Every day | ORAL | 3 refills | Status: AC

## 2023-08-25 MED ORDER — NITROGLYCERIN 0.4 MG SL SUBL: 0.4000 mg | SUBLINGUAL_TABLET | SUBLINGUAL | 0 refills | Status: AC | PRN

## 2023-08-25 NOTE — Telephone Encounter (Signed)
 Copied from CRM [id number]. Topic: Clinical - Prescription Issue >> Aug 25, 2023  2:52 PM Denese Killings wrote: Reason for CRM: Casimiro Needle with [facility] stated that they did not receive all of patients prescriptions. They didn't receive BD nano 2 Gen Pen Needles 32g4MM, ergocalciferol (VITAMIN D2) 1.25 MG Semglee Insulin, SKYRIZI 360 MG/2.4ML SOCT, enalapril (VASOTEC) 10 MG tablet, fenofibrate (TRICOR) 145 MG tablet, and One tough ultra test strips.

## 2023-08-25 NOTE — Patient Instructions (Addendum)
 Medication Instructions:  Your physician recommends that you continue on your current medications as directed. Please refer to the Current Medication list given to you today.  Refill for nitroglycerin and Enalapril have been sent to your pharmacy.     *If you need a refill on your cardiac medications before your next appointment, please call your pharmacy*  Lab Work: To be completed within the next week: FASTING lipid panel, direct LDL, and CMP  If you have labs (blood work) drawn today and your tests are completely normal, you will receive your results only by: MyChart Message (if you have MyChart) OR A paper copy in the mail If you have any lab test that is abnormal or we need to change your treatment, we will call you to review the results.  Testing/Procedures: Your physician has requested that you have an echocardiogram. Echocardiography is a painless test that uses sound waves to create images of your heart. It provides your doctor with information about the size and shape of your heart and how well your heart's chambers and valves are working. This procedure takes approximately one hour. There are no restrictions for this procedure. Please do NOT wear cologne, perfume, aftershave, or lotions (deodorant is allowed). Please arrive 15 minutes prior to your appointment time.  Please note: We ask at that you not bring children with you during ultrasound (echo/ vascular) testing. Due to room size and safety concerns, children are not allowed in the ultrasound rooms during exams. Our front office staff cannot provide observation of children in our lobby area while testing is being conducted. An adult accompanying a patient to their appointment will only be allowed in the ultrasound room at the discretion of the ultrasound technician under special circumstances. We apologize for any inconvenience.   Follow-Up: At [facility], you and your health needs are our priority.  As part of our  continuing mission to provide you with exceptional heart care, we have created designated Provider Care Teams.  These Care Teams include your primary Cardiologist (physician) and Advanced Practice Providers (APPs -  Physician Assistants and Nurse Practitioners) who all work together to provide you with the care you need, when you need it.  We recommend signing up for the patient portal called "MyChart".  Sign up information is provided on this After Visit Summary.  MyChart is used to connect with patients for Virtual Visits (Telemedicine).  Patients are able to view lab/test results, encounter notes, upcoming appointments, etc.  Non-urgent messages can be sent to your provider as well.   To learn more about what you can do with MyChart, go to [email or web address].    Your next appointment:   1 year(s)  The format for your next appointment:   In Person  Provider:   Tessa Lerner, DO {  Other Instructions   1st Floor: - Lobby - Registration  - Pharmacy  - Lab - Cafe  2nd Floor: - PV Lab - Diagnostic Testing (echo, CT, nuclear med)  3rd Floor: - Vacant  4th Floor: - TCTS (cardiothoracic surgery) - AFib Clinic - Structural Heart Clinic - Vascular Surgery  - Vascular Ultrasound  5th Floor: - [facility]Care Cardiology (general and EP) - Clinical Pharmacy for coumadin, hypertension, lipid, weight-loss medications, and med management appointments    Valet parking services will be available as well.

## 2023-08-25 NOTE — Progress Notes (Signed)
 Cardiology Office Note:  .   Date:  08/25/2023  ID:  [name], DOB November 24, 1960, MRN [id number] PCP:  Myrlene Broker, MD  Former Cardiology Providers: Dr. Delton See, Dr. Reuel Derby,  [facility] Providers Cardiologist:  Tessa Lerner, DO , [facility] (established care 08/25/23) Electrophysiologist:  None  Click to update primary MD,subspecialty MD or APP then REFRESH:1}    Chief Complaint  Patient presents with   Coronary artery disease of native artery of native heart wi   Follow-up    History of Present Illness: .   [name] is a 63 y.o. Caucasian female whose past medical history and cardiovascular risk factors includes: CAD s/p DES to pLAD in 03/2017, HLD,  palpitations, HTN, DM and Crohn's disease.   Formally under the care of Dr. Shari Prows who last saw [name] back in 11/2021. I am seeing her for the first time to re-establishing care.   At the last office visit with Dr. Shari Prows patient endorsed that she no longer afford Repatha, lipids were not well-controlled, and her A1c was approximate 11.7.  Dr. Shari Prows reinforced importance of improving her modifiable cardiovascular risk factors.  Since then she has been evaluated by APP's and now presents to the office to reestablish care with primary cardiology.  Clinically she denies anginal chest pain or heart failure symptoms.  No structured exercise program or daily routine.  But states that she does have a grandchild who is 27 years old and she is a primary caretaker and is always running behind her grandkids.  She is currently taking her Repatha, other lipid-lowering agents, and her pharmacological therapy without any obstacles regarding cost or axis.  No use of sublingual nitroglycerin tablets.  She has not had any recent labs to check fasting lipids.    Review of Systems: .   Review of Systems  Cardiovascular:  Negative for chest pain, claudication, irregular heartbeat, leg swelling, near-syncope, orthopnea,  palpitations, paroxysmal nocturnal dyspnea and syncope.  Respiratory:  Negative for shortness of breath.   Hematologic/Lymphatic: Negative for bleeding problem.    Studies Reviewed:   EKG: EKG Interpretation Date/Time:  Thursday August 25 2023 16:25:13 EDT Ventricular Rate:  78 PR Interval:  144 QRS Duration:  92 QT Interval:  380 QTC Calculation: 433 R Axis:   84  Text Interpretation: Normal sinus rhythm Normal ECG When compared with ECG of 18-Jun-2022 15:36, No significant change was found Confirmed by Tessa Lerner [phone]) on 08/25/2023 4:36:26 PM  Echocardiogram: 03/2017: LVEF 55 to 60%, mild hypokinesis of the apical anteroseptal myocardium, grade 1 diastolic dysfunction, see report for additional details  Stress Testing: Stress Test 08/18/2020: The left ventricular ejection fraction is normal (55-65%). Nuclear stress EF: 65%. There was no ST segment deviation noted during stress. The study is normal.   No evidence for ischemia or infarction.  Normal wall motion  CORONARY STENT INTERVENTION   03/2017  INTRAVASCULAR PRESSURE WIRE/FFR STUDY  LEFT HEART CATH AND CORONARY ANGIOGRAPHY      Conclusion     Prox LAD lesion is 65% stenosed. A drug-eluting stent was successfully placed using a STENT PROMUS PREM MR 3.0X12. Post intervention, there is a 0% residual stenosis. The left ventricular systolic function is normal. LV end diastolic pressure is normal. The left ventricular ejection fraction is 50-55% by visual estimate.   1. Single vessel obstructive CAD. There is focal and somewhat hazy lesion in the proximal LAD. iFr was 0.88 with FFR of .82. This is of borderline significance  but given her clinical scenario with typical chest pain, Ecg changes, and T wave abnormality along with anterior wall motion abnormality we decided to treat this lesion.  2. Overall good LV function 3. Normal LVEDP 4. Successful stenting of the proximal LAD with DES  RADIOLOGY: N/A  Risk  Assessment/Calculations:   NA   Labs:       Latest Ref Rng & Units 07/19/2023   10:41 AM 05/31/2023   12:37 PM 03/22/2023    9:24 AM  CBC  WBC 4.0 - 10.5 K/uL 10.6  7.5  7.7   Hemoglobin 12.0 - 15.0 g/dL 16.1  09.6  04.5   Hematocrit 36.0 - 46.0 % 44.7  43.9  44.1   Platelets 150.0 - 400.0 K/uL 374.0  305.0  292.0        Latest Ref Rng & Units 07/19/2023   10:41 AM 07/13/2023    2:34 PM 05/31/2023   12:37 PM  BMP  Glucose 70 - 99 mg/dL 409   811   BUN 6 - 23 mg/dL 19   15   Creatinine 9.14 - 1.20 mg/dL 7.82  9.56  2.13   Sodium 135 - 145 mEq/L 138   140   Potassium 3.5 - 5.1 mEq/L 4.4   4.1   Chloride 96 - 112 mEq/L 100   104   CO2 19 - 32 mEq/L 29   26   Calcium 8.4 - 10.5 mg/dL 9.8   9.9       Latest Ref Rng & Units 07/19/2023   10:41 AM 07/13/2023    2:34 PM 05/31/2023   12:37 PM  CMP  Glucose 70 - 99 mg/dL 086   578   BUN 6 - 23 mg/dL 19   15   Creatinine 4.69 - 1.20 mg/dL 6.29  5.28  4.13   Sodium 135 - 145 mEq/L 138   140   Potassium 3.5 - 5.1 mEq/L 4.4   4.1   Chloride 96 - 112 mEq/L 100   104   CO2 19 - 32 mEq/L 29   26   Calcium 8.4 - 10.5 mg/dL 9.8   9.9   Total Protein 6.0 - 8.3 g/dL 7.3   6.8   Total Bilirubin 0.2 - 1.2 mg/dL 0.4   0.3   Alkaline Phos 39 - 117 U/L 104   112   AST 0 - 37 U/L 48   35   ALT 0 - 35 U/L 47   48     Lab Results  Component Value Date   CHOL 204 (H) 08/14/2021   HDL 37.40 (L) 08/14/2021   LDLCALC 7 07/14/2020   LDLDIRECT 102.0 08/14/2021   TRIG (H) 08/14/2021    484.0 Triglyceride is over 400; calculations on Lipids are invalid.   CHOLHDL 5 08/14/2021   No results for input(s): "LIPOA" in the last 8760 hours. No components found for: "NTPROBNP" No results for input(s): "PROBNP" in the last 8760 hours. No results for input(s): "TSH" in the last 8760 hours.  Physical Exam:    Today's Vitals   08/25/23 1622  BP: 132/72  Pulse: 72  Resp: 16  SpO2: 94%  Weight: 138 lb 9.6 oz (62.9 kg)  Height: 5\' 2"  (1.575 m)    Body mass index is 25.35 kg/m. Wt Readings from Last 3 Encounters:  08/25/23 138 lb 9.6 oz (62.9 kg)  07/19/23 139 lb (63 kg)  07/18/23 138 lb (62.6 kg)    Physical Exam  Constitutional: No  distress.  hemodynamically stable  Neck: No JVD present.  Cardiovascular: Normal rate, regular rhythm, S1 normal and S2 normal. Exam reveals no gallop, no S3 and no S4.  No murmur heard. Pulmonary/Chest: Effort normal and breath sounds normal. No stridor. She has no wheezes. She has no rales.  Musculoskeletal:        General: No edema.     Cervical back: Neck supple.  Skin: Skin is warm.     Impression & Recommendation(s):  Impression:   ICD-10-CM   1. Coronary artery disease involving native coronary artery of native heart without angina pectoris  I25.10 EKG 12-Lead    nitroGLYCERIN (NITROSTAT) 0.4 MG SL tablet    ECHOCARDIOGRAM COMPLETE    2. S/P angioplasty with stent  Z95.820 nitroGLYCERIN (NITROSTAT) 0.4 MG SL tablet    3. Essential hypertension  I10     4. Hyperlipidemia associated with type 2 diabetes mellitus (HCC)  E11.69 Comprehensive metabolic panel with GFR   E78.5 Lipid panel    LDL cholesterol, direct    LDL cholesterol, direct    Lipid panel    Comprehensive metabolic panel with GFR    5. Statin myopathy  G72.0    T46.6X5A     6. Pure hypertriglyceridemia  E78.1 Comprehensive metabolic panel with GFR    Lipid panel    LDL cholesterol, direct    LDL cholesterol, direct    Lipid panel    Comprehensive metabolic panel with GFR    7. Diabetes mellitus with complication (HCC)  E11.8        Recommendation(s):  Coronary artery disease involving native coronary artery of native heart without angina pectoris S/P angioplasty with stent Status post coronary intervention back in 2018. Denies anginal chest pain or heart failure symptoms. EKG today is nonischemic. No use of sublingual nitroglycerin tablets since the last office visit. Continue aspirin 81 mg p.o.  daily. Continue Repatha. Continue Vascepa 1 g 2 tablets twice daily. Continue fenofibrate 145 mg p.o. daily. Sublingual nitroglycerin tablets refilled. Plan echocardiogram prior to the next office visit to reevaluate LVEF. No current indications for repeat stress test as she is asymptomatic. She quit smoking in 2006 for which she is congratulated for at today's office visit. Reemphasized the importance of secondary prevention with focus on improving her modifiable cardiovascular risk factors such as glycemic control, lipid management, blood pressure control, continued smoking cessation  Essential hypertension Office blood pressures are very well-controlled. Continue Jardiance 25 mg p.o. daily. Continue enalapril 10 mg p.o. daily, refill provided. Continue Toprol-XL 25 mg 1.5 tabs p.o. daily.   Hyperlipidemia associated with type 2 diabetes mellitus (HCC) Statin myopathy Pure hypertriglyceridemia No recent lipid profile available for review. Check fasting lipids and direct LDL. As mentioned above continue Repatha, fenofibrate, Vascepa  Diabetes mellitus with complication (HCC) Reemphasized the importance of glycemic control. Hemoglobin A1c as of August 2024 8.3% Currently on ACE inhibitors, intolerant/statin myopathy and therefore on Repatha, Jardiance.   Orders Placed:  Orders Placed This Encounter  Procedures   Comprehensive metabolic panel with GFR    Standing Status:   Future    Number of Occurrences:   1    Expected Date:   08/26/2023    Expiration Date:   08/24/2024   Lipid panel    Standing Status:   Future    Number of Occurrences:   1    Expected Date:   08/26/2023    Expiration Date:   08/24/2024   LDL cholesterol, direct  Standing Status:   Future    Number of Occurrences:   1    Expected Date:   08/26/2023    Expiration Date:   08/24/2024   EKG 12-Lead   ECHOCARDIOGRAM COMPLETE    Standing Status:   Future    Expected Date:   09/01/2023    Expiration Date:    08/24/2024    Where should this test be performed:   [facility])    Does the patient weigh less than or greater than 250 lbs?:   Patient weighs less than 250 lbs    Perflutren DEFINITY (image enhancing agent) should be administered unless hypersensitivity or allergy exist:   Administer Perflutren    Reason for exam-Echo:   Other-Full Diagnosis List    Full ICD-10/Reason for Exam:   CAD (coronary artery disease) [626948]     Final Medication List:    Meds ordered this encounter  Medications   enalapril (VASOTEC) 10 MG tablet    Sig: Take 1 tablet (10 mg) by mouth daily.    Dispense:  90 tablet    Refill:  3    Pt must keep upcoming followup appt with Dr. Odis Hollingshead with Cardiology in March 2025 for any more refills. Thank You   nitroGLYCERIN (NITROSTAT) 0.4 MG SL tablet    Sig: Place 1 tablet (0.4 mg total) under the tongue every 5 (five) minutes as needed for chest pain.  Call 911 if no relief after 1st dose.    Dispense:  30 tablet    Refill:  0    Please call our office to schedule an yearly appointment with new Cardiologist for December 2024 before anymore refills. [phone]. Thank you 1st attempt    Medications Discontinued During This Encounter  Medication Reason   nitroGLYCERIN (NITROSTAT) 0.4 MG SL tablet Reorder   enalapril (VASOTEC) 10 MG tablet Reorder     Current Outpatient Medications:    acetaminophen (TYLENOL) 650 MG CR tablet, Take 650 mg by mouth every 8 (eight) hours as needed for pain., Disp: , Rfl:    albuterol (PROVENTIL) (2.5 MG/3ML) 0.083% nebulizer solution, Take 2.5 mg by nebulization every 4 (four) hours as needed for wheezing or shortness of breath. , Disp: , Rfl:    albuterol (VENTOLIN HFA) 108 (90 Base) MCG/ACT inhaler, Inhale 1-2 puffs into the lungs every 6 (six) hours as needed for wheezing or shortness of breath., Disp: , Rfl:    aspirin EC 81 MG tablet, Take 81 mg by mouth daily. Swallow whole., Disp: , Rfl:    Blood Glucose  Monitoring Suppl (FREESTYLE LITE) w/Device KIT, Use to check blood sugar up to 4 times a day as directed, Disp: 1 kit, Rfl: 0   budesonide (ENTOCORT EC) 3 MG 24 hr capsule, Take 3 capsules (9 mg total) by mouth daily., Disp: 270 capsule, Rfl: 3   colestipol (COLESTID) 1 g tablet, TAKE 1 TABLET BY MOUTH TWICE A DAY, Disp: 180 tablet, Rfl: 1   Continuous Blood Gluc Sensor (FREESTYLE LIBRE 14 DAY SENSOR) MISC, Use to monitor sugars, Disp: 2 each, Rfl: 11   Continuous Blood Gluc Sensor (FREESTYLE LIBRE SENSOR SYSTEM) MISC, Use for blood sugar monitoring, Disp: 1 each, Rfl: 0   cyanocobalamin (VITAMIN B12) 1000 MCG/ML injection, Inject 1 mL (1,000 mcg total) into the muscle every 30 (thirty) days., Disp: 1 mL, Rfl: 11   dicyclomine (BENTYL) 20 MG tablet, Take 1 tablet (20 mg total) by mouth every 6 (six) hours., Disp: 60  tablet, Rfl: 0   DULoxetine (CYMBALTA) 60 MG capsule, Take 1 capsule (60 mg total) by mouth daily., Disp: 90 capsule, Rfl: 3   empagliflozin (JARDIANCE) 25 MG TABS tablet, Take 1 tablet (25 mg total) by mouth daily before breakfast., Disp: 90 tablet, Rfl: 3   ergocalciferol (VITAMIN D2) 1.25 MG (50000 UT) capsule, Take 1 capsule (50,000 Units total) by mouth once a week., Disp: 12 capsule, Rfl: 0   Evolocumab (REPATHA SURECLICK) 140 MG/ML SOAJ, Inject 140 mg into the skin every 14 (fourteen) days., Disp: 6 mL, Rfl: 3   fenofibrate (TRICOR) 145 MG tablet, TAKE ONE TABLET BY MOUTH ONCE DAILY, Disp: 30 tablet, Rfl: 0   furosemide (LASIX) 40 MG tablet, Take 1 tablet (40 mg total) by mouth daily as needed for fluid or edema., Disp: 90 tablet, Rfl: 1   glimepiride (AMARYL) 2 MG tablet, Take 1 tablet (2 mg total) by mouth daily before breakfast., Disp: 90 tablet, Rfl: 0   glucose blood (TRUE METRIX BLOOD GLUCOSE TEST) test strip, Use to check blood sugar up to 4 times a day as directed., Disp: 200 each, Rfl: 3   Insulin Pen Needle (PEN NEEDLES 3/16") 31G X 5 MM MISC, Use daily, Disp: 50 each,  Rfl: 11   Lancets (FREESTYLE) lancets, Use to check blood sugar up to 4 times a day as directed, Disp: 100 each, Rfl: 0   LANTUS SOLOSTAR 100 UNIT/ML Solostar Pen, Inject 58 Units into the skin daily., Disp: , Rfl:    levocetirizine (XYZAL) 5 MG tablet, Take 1 tablet  by mouth every evening., Disp: 90 tablet, Rfl: 3   metFORMIN (GLUCOPHAGE-XR) 500 MG 24 hr tablet, Take 2 tablets by mouth daily., Disp: 180 tablet, Rfl: 3   metoprolol succinate (TOPROL-XL) 25 MG 24 hr tablet, Take 1.5 tablets (37.5 mg total) by mouth daily., Disp: 45 tablet, Rfl: 0   montelukast (SINGULAIR) 10 MG tablet, Take 1 tablet (10 mg total) by mouth every evening., Disp: 90 tablet, Rfl: 3   Multiple Vitamins-Minerals (MULTIVITAMIN WITH MINERALS) tablet, Take 1 tablet by mouth daily., Disp: , Rfl:    ondansetron (ZOFRAN ODT) 4 MG disintegrating tablet, Take 1 tablet (4 mg total) by mouth every 4 (four) hours as needed for nausea or vomiting., Disp: 20 tablet, Rfl: 0   pantoprazole (PROTONIX) 40 MG tablet, Take 1 tablet (40 mg total) by mouth daily., Disp: 90 tablet, Rfl: 3   Probiotic Product (PROBIOTIC DAILY PO), Take 1 capsule by mouth daily., Disp: , Rfl:    SKYRIZI 360 MG/2.4ML SOCT, 360 mg SQ injection by cartridge and repeat every 8 weeks, Disp: 2.4 mL, Rfl: 7   sodium chloride (OCEAN) 0.65 % SOLN nasal spray, Place 1 spray into both nostrils daily as needed for congestion., Disp: , Rfl:    sucralfate (CARAFATE) 1 g tablet, Take 1 tablet (1 g total) by mouth 4 (four) times daily -  with meals and at bedtime., Disp: 120 tablet, Rfl: 0   VASCEPA 1 g capsule, Take 2 capsules by mouth 2 times daily., Disp: 360 capsule, Rfl: 1   Vitamin D3 (VITAMIN D) 25 MCG tablet, Take 1,000 Units by mouth at bedtime., Disp: , Rfl:    enalapril (VASOTEC) 10 MG tablet, Take 1 tablet (10 mg) by mouth daily., Disp: 90 tablet, Rfl: 3   nitroGLYCERIN (NITROSTAT) 0.4 MG SL tablet, Place 1 tablet (0.4 mg total) under the tongue every 5 (five)  minutes as needed for chest pain.  Call 911 if  no relief after 1st dose., Disp: 30 tablet, Rfl: 0  Consent:   NA  Disposition:   Recommend 1 year follow-up sooner if needed.  Her questions and concerns were addressed to her satisfaction. She voices understanding of the recommendations provided during this encounter.    Signed, Tessa Lerner, DO, [facility]  [facility]  [address] [place], [place] [address] 08/25/2023 6:35 PM

## 2023-08-26 NOTE — Telephone Encounter (Signed)
 Copied from CRM [id number]. Topic: General - Other >> Aug 26, 2023  1:35 PM Almira Coaster wrote: Reason for CRM: Corrie Dandy from [facility] is calling to follow up that they received the notes but not the prescription for the Free style [employer] or reader. Notes were from August 2024 and they need recent from within 6 months. Best call back number [phone].

## 2023-08-28 ENCOUNTER — Other Ambulatory Visit: Payer: Self-pay | Admitting: Nurse Practitioner

## 2023-08-29 ENCOUNTER — Other Ambulatory Visit: Payer: Self-pay | Admitting: Internal Medicine

## 2023-08-29 ENCOUNTER — Encounter: Payer: Self-pay | Admitting: Certified Registered Nurse Anesthetist

## 2023-08-29 NOTE — Telephone Encounter (Signed)
 Copied from CRM [id number]. Topic: Clinical - Medication Refill >> Aug 29, 2023  2:40 PM Sonny Dandy B wrote: Most Recent Primary Care Visit:  Provider: Hillard Danker A  Department: [facility]  Visit Type: OFFICE VISIT  Date: 04/19/2023  Medication: ergocalciferol (VITAMIN D2) 1.25 MG (50000 UT) capsule , fenofibrate (TRICOR) 145 MG tablet, enalapril (VASOTEC) 10 MG tablet, SKYRIZI 360 MG/2.4ML SOCT request 90 day supply  Has the patient contacted their pharmacy? Yes (Agent: If no, request that the patient contact the pharmacy for the refill. If patient does not wish to contact the pharmacy document the reason why and proceed with request.) (Agent: If yes, when and what did the pharmacy advise?)  Is this the correct pharmacy for this prescription? Yes If no, delete pharmacy and type the correct one.  This is the patient's preferred pharmacy:    [facility] [place] [address] [place] [address] Phone: [phone] Fax: [phone]   Has the prescription been filled recently? Yes  Is the patient out of the medication? Yes  Has the patient been seen for an appointment in the last year OR does the patient have an upcoming appointment? Yes  Can we respond through MyChart? Yes  Agent: Please be advised that Rx refills may take up to 3 business days. We ask that you follow-up with your pharmacy.

## 2023-08-30 ENCOUNTER — Other Ambulatory Visit: Payer: Self-pay

## 2023-08-30 MED ORDER — LANTUS SOLOSTAR 100 UNIT/ML ~~LOC~~ SOPN: 58.0000 [IU] | PEN_INJECTOR | Freq: Every day | SUBCUTANEOUS | 1 refills | Status: DC

## 2023-08-30 MED ORDER — ERGOCALCIFEROL 1.25 MG (50000 UT) PO CAPS: 50000.0000 [IU] | ORAL_CAPSULE | ORAL | 0 refills | Status: AC

## 2023-08-30 NOTE — Telephone Encounter (Signed)
 Last Fill: Vitamin D: Unknown     Enalapril: 08/25/23 90 tabs/3 RF     Fenofibrate: 08/29/23 90 tabs/1 RF     Skyrizi: 03/17/23 2.4 mL/7RF  Last OV: 04/19/23 Next OV: None Scheduled  Routing to provider for review/authorization.

## 2023-08-31 DIAGNOSIS — M5451 Vertebrogenic low back pain: Secondary | ICD-10-CM | POA: Diagnosis not present

## 2023-08-31 LAB — HM PAP SMEAR: HPV, high-risk: NEGATIVE

## 2023-08-31 NOTE — Telephone Encounter (Signed)
 Copied from CRM [id number]. Topic: General - Other >> Aug 26, 2023  1:35 PM Almira Coaster wrote: Reason for CRM: Corrie Dandy from [facility] is calling to follow up that they received the notes but not the prescription for the Free style [employer] or reader. Notes were from August 2024 and they need recent from within 6 months. Best call back number [phone]. >> Aug 31, 2023 11:31 AM Truddie Crumble wrote: Maxine Glenn from [facility] is calling checking on clinical notes for diabetes and a prescription for a freestyle libre CB [phone]

## 2023-09-01 ENCOUNTER — Ambulatory Visit (INDEPENDENT_AMBULATORY_CARE_PROVIDER_SITE_OTHER)

## 2023-09-01 VITALS — BP 140/62 | HR 61 | Temp 98.2°F | Resp 20 | Ht 63.0 in | Wt 136.8 lb

## 2023-09-01 DIAGNOSIS — K5010 Crohn's disease of large intestine without complications: Secondary | ICD-10-CM | POA: Diagnosis not present

## 2023-09-01 MED ORDER — SODIUM CHLORIDE 0.9 % IV SOLN
600.0000 mg | Freq: Once | INTRAVENOUS | Status: AC
  Administered 2023-09-01: 600 mg via INTRAVENOUS
  Filled 2023-09-01: qty 10

## 2023-09-01 NOTE — Progress Notes (Signed)
 Diagnosis: Crohn's Disease  Provider:  Chilton Greathouse MD  Procedure: IV Infusion  IV Type: Peripheral, IV Location: L Forearm  Skyrizi (risankizumab-rzaa), Dose: 600 mg  Infusion Start Time: 0935  Infusion Stop Time: 1043  Post Infusion IV Care: Peripheral IV Discontinued  Discharge: Condition: Good, Destination: Home . AVS Provided  Performed by:  Rico Ala, LPN

## 2023-09-02 ENCOUNTER — Ambulatory Visit (AMBULATORY_SURGERY_CENTER): Payer: Medicare Other | Admitting: Gastroenterology

## 2023-09-02 ENCOUNTER — Encounter: Payer: Self-pay | Admitting: Gastroenterology

## 2023-09-02 VITALS — BP 122/71 | HR 69 | Temp 97.2°F | Resp 18 | Ht 62.0 in | Wt 138.0 lb

## 2023-09-02 DIAGNOSIS — M797 Fibromyalgia: Secondary | ICD-10-CM | POA: Diagnosis not present

## 2023-09-02 DIAGNOSIS — F32A Depression, unspecified: Secondary | ICD-10-CM | POA: Diagnosis not present

## 2023-09-02 DIAGNOSIS — K50818 Crohn's disease of both small and large intestine with other complication: Secondary | ICD-10-CM

## 2023-09-02 DIAGNOSIS — K5140 Inflammatory polyps of colon without complications: Secondary | ICD-10-CM

## 2023-09-02 DIAGNOSIS — K6389 Other specified diseases of intestine: Secondary | ICD-10-CM

## 2023-09-02 DIAGNOSIS — Z1211 Encounter for screening for malignant neoplasm of colon: Secondary | ICD-10-CM | POA: Diagnosis not present

## 2023-09-02 DIAGNOSIS — F419 Anxiety disorder, unspecified: Secondary | ICD-10-CM | POA: Diagnosis not present

## 2023-09-02 DIAGNOSIS — E119 Type 2 diabetes mellitus without complications: Secondary | ICD-10-CM | POA: Diagnosis not present

## 2023-09-02 DIAGNOSIS — I2510 Atherosclerotic heart disease of native coronary artery without angina pectoris: Secondary | ICD-10-CM | POA: Diagnosis not present

## 2023-09-02 DIAGNOSIS — Z860100 Personal history of colon polyps, unspecified: Secondary | ICD-10-CM | POA: Diagnosis not present

## 2023-09-02 DIAGNOSIS — K5710 Diverticulosis of small intestine without perforation or abscess without bleeding: Secondary | ICD-10-CM | POA: Diagnosis not present

## 2023-09-02 MED ORDER — SODIUM CHLORIDE 0.9 % IV SOLN
500.0000 mL | INTRAVENOUS | Status: DC
Start: 2023-09-02 — End: 2023-09-02

## 2023-09-02 NOTE — Patient Instructions (Signed)
Discharge instructions given. Biopsies taken. Resume previous medications. YOU HAD AN ENDOSCOPIC PROCEDURE TODAY AT THE [facility]:   Refer to the procedure report that was given to you for any specific questions about what was found during the examination.  If the procedure report does not answer your questions, please call your gastroenterologist to clarify.  If you requested that your care partner not be given the details of your procedure findings, then the procedure report has been included in a sealed envelope for you to review at your convenience later.  YOU SHOULD EXPECT: Some feelings of bloating in the abdomen. Passage of more gas than usual.  Walking can help get rid of the air that was put into your GI tract during the procedure and reduce the bloating. If you had a lower endoscopy (such as a colonoscopy or flexible sigmoidoscopy) you may notice spotting of blood in your stool or on the toilet paper. If you underwent a bowel prep for your procedure, you may not have a normal bowel movement for a few days.  Please Note:  You might notice some irritation and congestion in your nose or some drainage.  This is from the oxygen used during your procedure.  There is no need for concern and it should clear up in a day or so.  SYMPTOMS TO REPORT IMMEDIATELY:  Following lower endoscopy (colonoscopy or flexible sigmoidoscopy):  Excessive amounts of blood in the stool  Significant tenderness or worsening of abdominal pains  Swelling of the abdomen that is new, acute  Fever of 100F or higher   For urgent or emergent issues, a gastroenterologist can be reached at any hour by calling [phone]. Do not use MyChart messaging for urgent concerns.    DIET:  We do recommend a small meal at first, but then you may proceed to your regular diet.  Drink plenty of fluids but you should avoid alcoholic beverages for 24 hours.  ACTIVITY:  You should plan to take it easy for the rest of  today and you should NOT DRIVE or use heavy machinery until tomorrow (because of the sedation medicines used during the test).    FOLLOW UP: Our staff will call the number listed on your records the next business day following your procedure.  We will call around 7:15- 8:00 am to check on you and address any questions or concerns that you may have regarding the information given to you following your procedure. If we do not reach you, we will leave a message.     If any biopsies were taken you will be contacted by phone or by letter within the next 1-3 weeks.  Please call us at [phone] if you have not heard about the biopsies in 3 weeks.    SIGNATURES/CONFIDENTIALITY: You and/or your care partner have signed paperwork which will be entered into your electronic medical record.  These signatures attest to the fact that that the information above on your After Visit Summary has been reviewed and is understood.  Full responsibility of the confidentiality of this discharge information lies with you and/or your care-partner. 

## 2023-09-02 NOTE — Progress Notes (Signed)
 Called to room to assist during endoscopic procedure.  Patient ID and intended procedure confirmed with present staff. Received instructions for my participation in the procedure from the performing physician.

## 2023-09-02 NOTE — Progress Notes (Signed)
 0820 BP 210/112, Labetalol given IV, MD update, vss

## 2023-09-02 NOTE — Progress Notes (Signed)
 [facility] History and Physical   Primary Care Physician:  Myrlene Broker, MD   Reason for Procedure:  History of crohn's colitis and polyps  Plan:    Surveillance colonoscopy with possible interventions as needed     HPI: [name] is a very pleasant 63 y.o. female here for surveillance colonoscopy. Denies any nausea, vomiting, abdominal pain, melena or bright red blood per rectum  The risks and benefits as well as alternatives of endoscopic procedure(s) have been discussed and reviewed. All questions answered. The patient agrees to proceed.    Past Medical History:  Diagnosis Date   Allergic rhinitis due to pollen    Allergy    Anxiety state, unspecified    Arthritis    Bronchitis    hx   CAD (coronary artery disease)    s/p NSTEMI in 11/18 treated with a DES to the proximal LAD   Calculus of gallbladder without mention of cholecystitis or obstruction    Crohn disease (HCC)    Depressive disorder, not elsewhere classified    Diabetes mellitus without complication (HCC)    diet controlled   Esophageal reflux    Fibromyalgia    Grade I diastolic dysfunction    Noted on ECHO   Headache    History of nuclear stress test    ETT-Myoview 10/17: EF 55%, normal perfusion; Low Risk // Nuclear stress test 05/2018:  EF 63, normal perfusion; Low Risk   Myalgia and myositis, unspecified    NSTEMI (non-ST elevated myocardial infarction) (HCC) 04/15/2017   Other and unspecified hyperlipidemia    Palpitations    Pneumonia 13   hx   S/P angioplasty with stent 04/18/17 with DES to pLAD 04/19/2017   Seizures (HCC)    1 seizure as a teenager    Unspecified essential hypertension    Vitamin B deficiency     Past Surgical History:  Procedure Laterality Date   BIOPSY  12/05/2018   Procedure: BIOPSY;  Surgeon: Napoleon Form, MD;  Location: [facility];  Service: Endoscopy;;   CHOLECYSTECTOMY N/A 09/19/2014   Procedure: LAPAROSCOPIC CHOLECYSTECTOMY WITH  INTRAOPERATIVE CHOLANGIOGRAM;  Surgeon: Manus Rudd, MD;  Location: [facility];  Service: General;  Laterality: N/A;   COLONOSCOPY WITH PROPOFOL N/A 12/05/2018   Procedure: COLONOSCOPY WITH PROPOFOL;  Surgeon: Napoleon Form, MD;  Location: [facility];  Service: Endoscopy;  Laterality: N/A;  chromoendoscopy   CORONARY PRESSURE/FFR STUDY N/A 04/18/2017   Procedure: INTRAVASCULAR PRESSURE WIRE/FFR STUDY;  Surgeon: Swaziland, Peter M, MD;  Location: [facility] INVASIVE CV LAB;  Service: Cardiovascular;  Laterality: N/A;   CORONARY STENT INTERVENTION N/A 04/18/2017   Procedure: CORONARY STENT INTERVENTION;  Surgeon: Swaziland, Peter M, MD;  Location: [facility] INVASIVE CV LAB;  Service: Cardiovascular;  Laterality: N/A;   ESOPHAGOGASTRODUODENOSCOPY     EXPLORATORY LAPAROTOMY     for endometriosis   LEFT HEART CATH AND CORONARY ANGIOGRAPHY N/A 04/18/2017   Procedure: LEFT HEART CATH AND CORONARY ANGIOGRAPHY;  Surgeon: Swaziland, Peter M, MD;  Location: [facility] INVASIVE CV LAB;  Service: Cardiovascular;  Laterality: N/A;   POLYPECTOMY  12/05/2018   Procedure: POLYPECTOMY;  Surgeon: Napoleon Form, MD;  Location: [facility];  Service: Endoscopy;;   SUBMUCOSAL LIFTING INJECTION  12/05/2018   Procedure: SUBMUCOSAL LIFTING INJECTION;  Surgeon: Napoleon Form, MD;  Location: [facility];  Service: Endoscopy;;   TONSILLECTOMY AND ADENOIDECTOMY     TUBAL LIGATION     BILATERAL    Prior to Admission medications   Medication  Sig Start Date End Date Taking? Authorizing Provider  aspirin EC 81 MG tablet Take 81 mg by mouth daily. Swallow whole.   Yes [provider]  Blood Glucose Monitoring Suppl (FREESTYLE LITE) w/Device KIT Use to check blood sugar up to 4 times a day as directed 03/10/21  Yes Myrlene Broker, MD  colestipol (COLESTID) 1 g tablet TAKE 1 TABLET BY MOUTH TWICE A DAY 07/26/23  Yes Vinayak Bobier, Eleonore Chiquito, MD  Continuous Blood Gluc Sensor (FREESTYLE LIBRE 14 DAY SENSOR) MISC Use to monitor sugars  04/03/21  Yes Myrlene Broker, MD  Continuous Blood Gluc Sensor (FREESTYLE LIBRE SENSOR SYSTEM) MISC Use for blood sugar monitoring 04/03/21  Yes Myrlene Broker, MD  cyanocobalamin (VITAMIN B12) 1000 MCG/ML injection Inject 1 mL (1,000 mcg total) into the muscle every 30 (thirty) days. 12/30/22  Yes Dontaye Hur, Eleonore Chiquito, MD  dicyclomine (BENTYL) 20 MG tablet Take 1 tablet (20 mg total) by mouth every 6 (six) hours. 06/02/21  Yes Iva Boop, MD  DULoxetine (CYMBALTA) 60 MG capsule Take 1 capsule (60 mg total) by mouth daily. 08/24/23  Yes Myrlene Broker, MD  empagliflozin (JARDIANCE) 25 MG TABS tablet Take 1 tablet (25 mg total) by mouth daily before breakfast. 08/24/23  Yes Myrlene Broker, MD  enalapril (VASOTEC) 10 MG tablet Take 1 tablet (10 mg) by mouth daily. 08/25/23  Yes Tolia, Sunit, DO  fenofibrate (TRICOR) 145 MG tablet TAKE 1 TABLET BY MOUTH EVERY DAY 08/29/23  Yes Tolia, Sunit, DO  glimepiride (AMARYL) 2 MG tablet Take 1 tablet (2 mg total) by mouth daily before breakfast. 08/24/23  Yes Myrlene Broker, MD  glucose blood (TRUE METRIX BLOOD GLUCOSE TEST) test strip Use to check blood sugar up to 4 times a day as directed. 08/18/23  Yes Myrlene Broker, MD  LANTUS SOLOSTAR 100 UNIT/ML Solostar Pen Inject 58 Units into the skin daily. 08/30/23  Yes Myrlene Broker, MD  levocetirizine (XYZAL) 5 MG tablet Take 1 tablet  by mouth every evening. 08/24/23  Yes Myrlene Broker, MD  metFORMIN (GLUCOPHAGE-XR) 500 MG 24 hr tablet Take 2 tablets by mouth daily. 08/24/23  Yes Myrlene Broker, MD  metoprolol succinate (TOPROL-XL) 25 MG 24 hr tablet Take 1.5 tablets (37.5 mg total) by mouth daily. 08/18/23  Yes Gaston Islam., NP  montelukast (SINGULAIR) 10 MG tablet Take 1 tablet (10 mg total) by mouth every evening. 08/24/23  Yes Myrlene Broker, MD  Multiple Vitamins-Minerals (MULTIVITAMIN WITH MINERALS) tablet Take 1 tablet by mouth daily.   Yes  [provider]  pantoprazole (PROTONIX) 40 MG tablet Take 1 tablet (40 mg total) by mouth daily. 12/30/22  Yes Katalyna Socarras, Eleonore Chiquito, MD  Probiotic Product (PROBIOTIC DAILY PO) Take 1 capsule by mouth daily.   Yes [provider]  SKYRIZI 360 MG/2.4ML SOCT 360 mg SQ injection by cartridge and repeat every 8 weeks 03/17/23  Yes Pershing Skidmore, Eleonore Chiquito, MD  sucralfate (CARAFATE) 1 g tablet Take 1 tablet (1 g total) by mouth 4 (four) times daily -  with meals and at bedtime. 11/24/22  Yes Bryley Kovacevic, Eleonore Chiquito, MD  VASCEPA 1 g capsule Take 2 capsules by mouth 2 times daily. 12/29/22  Yes Meriam Sprague, MD  Vitamin D3 (VITAMIN D) 25 MCG tablet Take 1,000 Units by mouth at bedtime.   Yes [provider]  acetaminophen (TYLENOL) 650 MG CR tablet Take 650 mg by mouth every 8 (eight) hours as  needed for pain.    [provider]  albuterol (PROVENTIL) (2.5 MG/3ML) 0.083% nebulizer solution Take 2.5 mg by nebulization every 4 (four) hours as needed for wheezing or shortness of breath.     [provider]  albuterol (VENTOLIN HFA) 108 (90 Base) MCG/ACT inhaler Inhale 1-2 puffs into the lungs every 6 (six) hours as needed for wheezing or shortness of breath.    [provider]  budesonide (ENTOCORT EC) 3 MG 24 hr capsule Take 3 capsules (9 mg total) by mouth daily. 12/30/22   Napoleon Form, MD  ergocalciferol (VITAMIN D2) 1.25 MG (50000 UT) capsule Take 1 capsule (50,000 Units total) by mouth once a week. 08/30/23   Myrlene Broker, MD  Evolocumab (REPATHA SURECLICK) 140 MG/ML SOAJ Inject 140 mg into the skin every 14 (fourteen) days. 08/24/23   Myrlene Broker, MD  furosemide (LASIX) 40 MG tablet Take 1 tablet (40 mg total) by mouth daily as needed for fluid or edema. Patient not taking: Reported on 09/02/2023 12/29/22   Meriam Sprague, MD  Insulin Pen Needle (PEN NEEDLES 3/16") 31G X 5 MM MISC Use daily 01/11/23   Napoleon Form, MD  Lancets  (FREESTYLE) lancets Use to check blood sugar up to 4 times a day as directed 03/10/21   Myrlene Broker, MD  nitroGLYCERIN (NITROSTAT) 0.4 MG SL tablet Place 1 tablet (0.4 mg total) under the tongue every 5 (five) minutes as needed for chest pain.  Call 911 if no relief after 1st dose. 08/25/23   Tolia, Sunit, DO  ondansetron (ZOFRAN ODT) 4 MG disintegrating tablet Take 1 tablet (4 mg total) by mouth every 4 (four) hours as needed for nausea or vomiting. 10/18/20   Arby Barrette, MD  sodium chloride (OCEAN) 0.65 % SOLN nasal spray Place 1 spray into both nostrils daily as needed for congestion.    [provider]  EPIPEN 2-PAK 0.3 MG/0.3ML SOAJ injection Inject 0.3 mg into the muscle as needed for anaphylaxis. Reported on 06/27/2015 10/09/14   [provider]    Current Outpatient Medications  Medication Sig Dispense Refill   aspirin EC 81 MG tablet Take 81 mg by mouth daily. Swallow whole.     Blood Glucose Monitoring Suppl (FREESTYLE LITE) w/Device KIT Use to check blood sugar up to 4 times a day as directed 1 kit 0   colestipol (COLESTID) 1 g tablet TAKE 1 TABLET BY MOUTH TWICE A DAY 180 tablet 1   Continuous Blood Gluc Sensor (FREESTYLE LIBRE 14 DAY SENSOR) MISC Use to monitor sugars 2 each 11   Continuous Blood Gluc Sensor (FREESTYLE LIBRE SENSOR SYSTEM) MISC Use for blood sugar monitoring 1 each 0   cyanocobalamin (VITAMIN B12) 1000 MCG/ML injection Inject 1 mL (1,000 mcg total) into the muscle every 30 (thirty) days. 1 mL 11   dicyclomine (BENTYL) 20 MG tablet Take 1 tablet (20 mg total) by mouth every 6 (six) hours. 60 tablet 0   DULoxetine (CYMBALTA) 60 MG capsule Take 1 capsule (60 mg total) by mouth daily. 90 capsule 3   empagliflozin (JARDIANCE) 25 MG TABS tablet Take 1 tablet (25 mg total) by mouth daily before breakfast. 90 tablet 3   enalapril (VASOTEC) 10 MG tablet Take 1 tablet (10 mg) by mouth daily. 90 tablet 3   fenofibrate (TRICOR) 145 MG tablet TAKE 1  TABLET BY MOUTH EVERY DAY 90 tablet 1   glimepiride (AMARYL) 2 MG tablet Take 1 tablet (2 mg total)  by mouth daily before breakfast. 90 tablet 0   glucose blood (TRUE METRIX BLOOD GLUCOSE TEST) test strip Use to check blood sugar up to 4 times a day as directed. 200 each 3   LANTUS SOLOSTAR 100 UNIT/ML Solostar Pen Inject 58 Units into the skin daily. 15 mL 1   levocetirizine (XYZAL) 5 MG tablet Take 1 tablet  by mouth every evening. 90 tablet 3   metFORMIN (GLUCOPHAGE-XR) 500 MG 24 hr tablet Take 2 tablets by mouth daily. 180 tablet 3   metoprolol succinate (TOPROL-XL) 25 MG 24 hr tablet Take 1.5 tablets (37.5 mg total) by mouth daily. 45 tablet 0   montelukast (SINGULAIR) 10 MG tablet Take 1 tablet (10 mg total) by mouth every evening. 90 tablet 3   Multiple Vitamins-Minerals (MULTIVITAMIN WITH MINERALS) tablet Take 1 tablet by mouth daily.     pantoprazole (PROTONIX) 40 MG tablet Take 1 tablet (40 mg total) by mouth daily. 90 tablet 3   Probiotic Product (PROBIOTIC DAILY PO) Take 1 capsule by mouth daily.     SKYRIZI 360 MG/2.4ML SOCT 360 mg SQ injection by cartridge and repeat every 8 weeks 2.4 mL 7   sucralfate (CARAFATE) 1 g tablet Take 1 tablet (1 g total) by mouth 4 (four) times daily -  with meals and at bedtime. 120 tablet 0   VASCEPA 1 g capsule Take 2 capsules by mouth 2 times daily. 360 capsule 1   Vitamin D3 (VITAMIN D) 25 MCG tablet Take 1,000 Units by mouth at bedtime.     acetaminophen (TYLENOL) 650 MG CR tablet Take 650 mg by mouth every 8 (eight) hours as needed for pain.     albuterol (PROVENTIL) (2.5 MG/3ML) 0.083% nebulizer solution Take 2.5 mg by nebulization every 4 (four) hours as needed for wheezing or shortness of breath.      albuterol (VENTOLIN HFA) 108 (90 Base) MCG/ACT inhaler Inhale 1-2 puffs into the lungs every 6 (six) hours as needed for wheezing or shortness of breath.     budesonide (ENTOCORT EC) 3 MG 24 hr capsule Take 3 capsules (9 mg total) by mouth daily.  270 capsule 3   ergocalciferol (VITAMIN D2) 1.25 MG (50000 UT) capsule Take 1 capsule (50,000 Units total) by mouth once a week. 12 capsule 0   Evolocumab (REPATHA SURECLICK) 140 MG/ML SOAJ Inject 140 mg into the skin every 14 (fourteen) days. 6 mL 3   furosemide (LASIX) 40 MG tablet Take 1 tablet (40 mg total) by mouth daily as needed for fluid or edema. (Patient not taking: Reported on 09/02/2023) 90 tablet 1   Insulin Pen Needle (PEN NEEDLES 3/16") 31G X 5 MM MISC Use daily 50 each 11   Lancets (FREESTYLE) lancets Use to check blood sugar up to 4 times a day as directed 100 each 0   nitroGLYCERIN (NITROSTAT) 0.4 MG SL tablet Place 1 tablet (0.4 mg total) under the tongue every 5 (five) minutes as needed for chest pain.  Call 911 if no relief after 1st dose. 30 tablet 0   ondansetron (ZOFRAN ODT) 4 MG disintegrating tablet Take 1 tablet (4 mg total) by mouth every 4 (four) hours as needed for nausea or vomiting. 20 tablet 0   sodium chloride (OCEAN) 0.65 % SOLN nasal spray Place 1 spray into both nostrils daily as needed for congestion.     Current Facility-Administered Medications  Medication Dose Route Frequency Provider Last Rate Last Admin   0.9 %  sodium chloride infusion  500 mL Intravenous Continuous Napoleon Form, MD        Allergies as of 09/02/2023 - Review Complete 09/02/2023  Allergen Reaction Noted   Lipitor [atorvastatin] Other (See Comments) 03/28/2013   Remicade [infliximab]     Rosuvastatin Other (See Comments) 01/25/2019   Ticagrelor  09/16/2017    Family History  Problem Relation Age of Onset   Lung cancer Maternal Grandmother    Stroke Father    Hypertension Mother    Heart attack Mother    Heart disease Mother    Hypertension Brother    Heart disease Brother    Hypertension Sister    Heart disease Sister    Coronary artery disease Brother    Colon cancer Neg Hx    Stomach cancer Neg Hx    Esophageal cancer Neg Hx    Rectal cancer Neg Hx     Social  History   Socioeconomic History   Marital status: Married    Spouse name: Dane   Number of children: 2   Years of education: 12+   Highest education level: Not on file  Occupational History   Occupation: ACCOUNT REP.    Employer: [employer]    Comment: retired  Tobacco Use   Smoking status: Former    Current packs/day: 0.00    Average packs/day: 1 pack/day for 35.0 years (35.0 ttl pk-yrs)    Types: Cigarettes    Start date: 05/31/1969    Quit date: 05/31/2004    Years since quitting: 19.2   Smokeless tobacco: Never  Vaping Use   Vaping status: Never Used  Substance and Sexual Activity   Alcohol use: No   Drug use: No   Sexual activity: Not Currently    Comment: married  Other Topics Concern   Not on file  Social History Narrative   Lives with her husband and their pets. Her children are adults and live independently.   Social Drivers of Corporate investment banker Strain: Not on file  Food Insecurity: No Food Insecurity (04/06/2022)   Hunger Vital Sign    Worried About Running Out of Food in the Last Year: Never true    Ran Out of Food in the Last Year: Never true  Transportation Needs: No Transportation Needs (04/06/2022)   PRAPARE - Administrator, Civil Service (Medical): No    Lack of Transportation (Non-Medical): No  Physical Activity: Not on file  Stress: Not on file  Social Connections: Not on file  Intimate Partner Violence: Not on file    Review of Systems:  All other review of systems negative except as mentioned in the HPI.  Physical Exam: Vital signs in last 24 hours: BP (!) 172/93   Pulse 76   Temp (!) 97.2 F (36.2 C) (Temporal)   Resp 17   Ht 5\' 2"  (1.575 m)   Wt 138 lb (62.6 kg)   LMP  (LMP Unknown)   SpO2 97%   BMI 25.24 kg/m  General:   Alert, NAD Lungs:  Clear .   Heart:  Regular rate and rhythm Abdomen:  Soft, nontender and nondistended. Neuro/Psych:  Alert and cooperative. Normal mood and affect. A and O  x 3  Reviewed labs, radiology imaging, old records and pertinent past GI work up  Patient is appropriate for planned procedure(s) and anesthesia in an ambulatory setting   K. Scherry Ran , MD [phone]

## 2023-09-02 NOTE — Op Note (Signed)
 [facility] Patient Name: [name] Procedure Date: 09/02/2023 8:05 AM MRN: [id number] Endoscopist: Napoleon Form , MD, [id number] Age: 63 Referring MD:  Date of Birth: [birth year] Gender: Female Account #: [id number] Procedure:                Colonoscopy Indications:              High risk colon cancer surveillance: Crohn's                            colitis of 8 (or more) years duration with                            one-third (or more) of the colon involved Medicines:                Monitored Anesthesia Care Procedure:                Pre-Anesthesia Assessment:                           - Prior to the procedure, a History and Physical                            was performed, and patient medications and                            allergies were reviewed. The patient's tolerance of                            previous anesthesia was also reviewed. The risks                            and benefits of the procedure and the sedation                            options and risks were discussed with the patient.                            All questions were answered, and informed consent                            was obtained. Prior Anticoagulants: The patient has                            taken no anticoagulant or antiplatelet agents. ASA                            Grade Assessment: III - A patient with severe                            systemic disease. After reviewing the risks and                            benefits, the patient was deemed in satisfactory  condition to undergo the procedure.                           After obtaining informed consent, the colonoscope                            was passed under direct vision. Throughout the                            procedure, the patient's blood pressure, pulse, and                            oxygen saturations were monitored continuously. The                            Olympus Scope SN:  [id number] was introduced through                            the anus and advanced to the the cecum, identified                            by appendiceal orifice and ileocecal valve. The                            colonoscopy was performed without difficulty. The                            patient tolerated the procedure well. The quality                            of the bowel preparation was good. The ileocecal                            valve, appendiceal orifice, and rectum were                            photographed. Scope In: 8:17:15 AM Scope Out: 8:30:58 AM Scope Withdrawal Time: 0 hours 10 minutes 5 seconds  Total Procedure Duration: 0 hours 13 minutes 43 seconds  Findings:                 The perianal and digital rectal examinations were                            normal.                           The Simple Endoscopic Score for Crohn's Disease was                            determined based on the endoscopic appearance of                            the mucosa in the following segments:                           -  Ileum: Findings include no ulcers present, no                            ulcerated surfaces, less than 50% of surfaces                            affected and no narrowings. Segment score: 1.                           - Right Colon: Findings include no ulcers present,                            no ulcerated surfaces, no affected surfaces and no                            narrowings. Segment score: 0.                           - Transverse Colon: Findings include no ulcers                            present, no ulcerated surfaces, no affected                            surfaces and no narrowings. Segment score: 0.                           - Left Colon: Findings include no ulcers present,                            no ulcerated surfaces, no affected surfaces and no                            narrowings. Segment score: 0.                           - Rectum: Findings  include no ulcers present, no                            ulcerated surfaces, no affected surfaces and no                            narrowings. Segment score: 0.                           - Total SES-CD aggregate score: 1. Biopsies were                            taken with a cold forceps for histology.                           The terminal ileum contained a few diverticula.  The terminal ileum contained scattered                            pseudopolyps. Biopsies were taken with a cold                            forceps for histology. Complications:            No immediate complications. Estimated Blood Loss:     Estimated blood loss was minimal. Impression:               - Simple Endoscopic Score for Crohn's Disease: 1,                            mucosal inflammatory changes secondary to Crohn's                            disease, in remission. Biopsied.                           - Ileal diverticula.                           - Pseudopolyps in the terminal ileum. Biopsied. Recommendation:           - Patient has a contact number available for                            emergencies. The signs and symptoms of potential                            delayed complications were discussed with the                            patient. Return to normal activities tomorrow.                            Written discharge instructions were provided to the                            patient.                           - Resume previous diet.                           - Continue present medications.                           - Await pathology results.                           - Repeat colonoscopy in 3 years for surveillance                            based on pathology results. Napoleon Form, MD 09/02/2023 8:39:36 AM This report has been signed electronically.

## 2023-09-02 NOTE — Progress Notes (Signed)
 Pt's states no medical or surgical changes since previsit or office visit.

## 2023-09-02 NOTE — Progress Notes (Signed)
 Report given to PACU, vss

## 2023-09-05 ENCOUNTER — Telehealth: Payer: Self-pay

## 2023-09-05 DIAGNOSIS — M5451 Vertebrogenic low back pain: Secondary | ICD-10-CM | POA: Diagnosis not present

## 2023-09-05 NOTE — Telephone Encounter (Signed)
  Follow up Call-     09/02/2023    7:37 AM 09/23/2021   11:05 AM  Call back number  Post procedure Call Back phone  # [phone]  Permission to leave phone message Yes Yes     Patient questions:  Do you have a fever, pain , or abdominal swelling? No. Pain Score  0 *  Have you tolerated food without any problems? Yes.    Have you been able to return to your normal activities? Yes.    Do you have any questions about your discharge instructions: Diet   No. Medications  No. Follow up visit  No.  Do you have questions or concerns about your Care? No.  Actions: * If pain score is 4 or above: No action needed, pain <4.

## 2023-09-06 LAB — SURGICAL PATHOLOGY

## 2023-09-08 ENCOUNTER — Telehealth: Payer: Self-pay | Admitting: Internal Medicine

## 2023-09-08 DIAGNOSIS — E119 Type 2 diabetes mellitus without complications: Secondary | ICD-10-CM | POA: Diagnosis not present

## 2023-09-08 NOTE — Telephone Encounter (Signed)
 Copied from CRM [id number]. Topic: Clinical - Prescription Issue >> Sep 08, 2023  8:37 AM Sim Boast F wrote: Reason for CRM: Patient says the Repatha is too expensive and would like to know if there's a way to get the cost down or if there's an alternative medication? She would like a call back today at [phone]

## 2023-09-11 ENCOUNTER — Other Ambulatory Visit: Payer: Self-pay | Admitting: Internal Medicine

## 2023-09-12 ENCOUNTER — Ambulatory Visit: Payer: Medicare Other

## 2023-09-13 DIAGNOSIS — F17211 Nicotine dependence, cigarettes, in remission: Secondary | ICD-10-CM | POA: Diagnosis not present

## 2023-09-13 DIAGNOSIS — E785 Hyperlipidemia, unspecified: Secondary | ICD-10-CM | POA: Diagnosis not present

## 2023-09-13 DIAGNOSIS — E1169 Type 2 diabetes mellitus with other specified complication: Secondary | ICD-10-CM | POA: Diagnosis not present

## 2023-09-13 DIAGNOSIS — F3341 Major depressive disorder, recurrent, in partial remission: Secondary | ICD-10-CM | POA: Diagnosis not present

## 2023-09-13 DIAGNOSIS — Z794 Long term (current) use of insulin: Secondary | ICD-10-CM | POA: Diagnosis not present

## 2023-09-13 DIAGNOSIS — F411 Generalized anxiety disorder: Secondary | ICD-10-CM | POA: Diagnosis not present

## 2023-09-13 DIAGNOSIS — M1990 Unspecified osteoarthritis, unspecified site: Secondary | ICD-10-CM | POA: Diagnosis not present

## 2023-09-13 DIAGNOSIS — Z008 Encounter for other general examination: Secondary | ICD-10-CM | POA: Diagnosis not present

## 2023-09-13 DIAGNOSIS — I10 Essential (primary) hypertension: Secondary | ICD-10-CM | POA: Diagnosis not present

## 2023-09-13 DIAGNOSIS — K219 Gastro-esophageal reflux disease without esophagitis: Secondary | ICD-10-CM | POA: Diagnosis not present

## 2023-09-13 DIAGNOSIS — E1159 Type 2 diabetes mellitus with other circulatory complications: Secondary | ICD-10-CM | POA: Diagnosis not present

## 2023-09-13 NOTE — Telephone Encounter (Signed)
Appropriate to refill

## 2023-09-15 ENCOUNTER — Telehealth: Payer: Self-pay | Admitting: Internal Medicine

## 2023-09-15 ENCOUNTER — Other Ambulatory Visit: Payer: Self-pay | Admitting: Internal Medicine

## 2023-09-15 NOTE — Telephone Encounter (Signed)
 Copied from CRM [id number]. Topic: General - Other >> Sep 15, 2023  1:22 PM Albertha Alosa wrote: Reason for CRM: Alice from [facility] called in regarding forms for freestyle libra continuous monitor - have refaxed and would like for   Forms to get them signed back as soon as possible and faxed back

## 2023-09-16 DIAGNOSIS — E781 Pure hyperglyceridemia: Secondary | ICD-10-CM | POA: Diagnosis not present

## 2023-09-16 DIAGNOSIS — E1169 Type 2 diabetes mellitus with other specified complication: Secondary | ICD-10-CM | POA: Diagnosis not present

## 2023-09-16 DIAGNOSIS — E785 Hyperlipidemia, unspecified: Secondary | ICD-10-CM | POA: Diagnosis not present

## 2023-09-16 LAB — LDL CHOLESTEROL, DIRECT: LDL Direct: 29 mg/dL (ref 0–99)

## 2023-09-16 LAB — LIPID PANEL
Chol/HDL Ratio: 2.7 ratio (ref 0.0–4.4)
Cholesterol, Total: 105 mg/dL (ref 100–199)
HDL: 39 mg/dL — ABNORMAL LOW (ref >39)
LDL Chol Calc (NIH): 27 mg/dL (ref 0–99)
Triglycerides: 259 mg/dL — ABNORMAL HIGH (ref 0–149)
VLDL Cholesterol Cal: 39 mg/dL (ref 5–40)

## 2023-09-16 LAB — COMPREHENSIVE METABOLIC PANEL WITH GFR
ALT: 93 IU/L — ABNORMAL HIGH (ref 0–32)
AST: 85 IU/L — ABNORMAL HIGH (ref 0–40)
Albumin: 4.5 g/dL (ref 3.9–4.9)
Alkaline Phosphatase: 126 IU/L — ABNORMAL HIGH (ref 44–121)
BUN/Creatinine Ratio: 24 (ref 12–28)
BUN: 15 mg/dL (ref 8–27)
Bilirubin Total: 0.5 mg/dL (ref 0.0–1.2)
CO2: 21 mmol/L (ref 20–29)
Calcium: 9.4 mg/dL (ref 8.7–10.3)
Chloride: 100 mmol/L (ref 96–106)
Creatinine, Ser: 0.63 mg/dL (ref 0.57–1.00)
Globulin, Total: 2.3 g/dL (ref 1.5–4.5)
Glucose: 133 mg/dL — ABNORMAL HIGH (ref 70–99)
Potassium: 4.8 mmol/L (ref 3.5–5.2)
Sodium: 141 mmol/L (ref 134–144)
Total Protein: 6.8 g/dL (ref 6.0–8.5)
eGFR: 98 mL/min/{1.73_m2} (ref >59)

## 2023-09-19 DIAGNOSIS — E781 Pure hyperglyceridemia: Secondary | ICD-10-CM

## 2023-09-19 NOTE — Telephone Encounter (Signed)
 This was singed and fax back over already

## 2023-09-22 ENCOUNTER — Encounter (HOSPITAL_COMMUNITY): Payer: Self-pay | Admitting: Cardiology

## 2023-09-22 ENCOUNTER — Ambulatory Visit (HOSPITAL_COMMUNITY): Attending: Internal Medicine

## 2023-09-22 NOTE — Telephone Encounter (Signed)
 This was not signed as she is overdue for diabetes follow up we are unable to sign without appropriate care for her diabetes with a visit.

## 2023-09-22 NOTE — Telephone Encounter (Signed)
 Error wrong from regarding the patient.

## 2023-09-23 ENCOUNTER — Other Ambulatory Visit (HOSPITAL_COMMUNITY): Payer: Self-pay

## 2023-09-23 ENCOUNTER — Encounter: Payer: Self-pay | Admitting: Gastroenterology

## 2023-09-23 ENCOUNTER — Other Ambulatory Visit: Payer: Self-pay | Admitting: Internal Medicine

## 2023-09-23 ENCOUNTER — Telehealth: Payer: Self-pay

## 2023-09-23 ENCOUNTER — Telehealth: Payer: Self-pay | Admitting: Cardiology

## 2023-09-23 DIAGNOSIS — E781 Pure hyperglyceridemia: Secondary | ICD-10-CM

## 2023-09-23 DIAGNOSIS — E1169 Type 2 diabetes mellitus with other specified complication: Secondary | ICD-10-CM

## 2023-09-23 DIAGNOSIS — I25118 Atherosclerotic heart disease of native coronary artery with other forms of angina pectoris: Secondary | ICD-10-CM

## 2023-09-23 DIAGNOSIS — I2510 Atherosclerotic heart disease of native coronary artery without angina pectoris: Secondary | ICD-10-CM

## 2023-09-23 NOTE — Telephone Encounter (Signed)
 Working on this, see separate encounter

## 2023-09-23 NOTE — Telephone Encounter (Signed)
 Patient Advocate Encounter   The patient was approved for a [employer] grant that will help cover the cost of REPATHA  Total amount awarded, $10,000.  Effective: 08/24/23 - 08/22/24   [id number] [id number]   Pharmacy provided with approval and processing information.   Sherlie Distance, CPhT  Pharmacy Patient Advocate Specialist  Direct Number: [phone] Fax: [phone]

## 2023-09-23 NOTE — Telephone Encounter (Signed)
 Refer to PharmD to see if she can apply for assistance.   Lendon George [place], DO, [facility]

## 2023-09-23 NOTE — Telephone Encounter (Signed)
 Pt c/o medication issue:  1. Name of Medication: Evolocumab  (REPATHA  SURECLICK) 140 MG/ML SOAJ   2. How are you currently taking this medication (dosage and times per day)? As written  3. Are you having a reaction (difficulty breathing--STAT)? No   4. What is your medication issue? Pt is having trouble paying for medicine even with insurance

## 2023-09-23 NOTE — Telephone Encounter (Signed)
 Pharmacy Patient Advocate Encounter   Received notification from Physician's Office that prior authorization for REPATHA  is required/requested.   Insurance verification completed.   The patient is insured through [facility] .   Per test claim: PA required; PA submitted to above mentioned insurance via CoverMyMeds Key/confirmation #/EOC [id number] Status is pending

## 2023-09-23 NOTE — Telephone Encounter (Signed)
 Copied from CRM [id number]. Topic: Clinical - Medication Refill >> Sep 23, 2023  9:54 AM Allison Arena wrote: Most Recent Primary Care Visit:  Provider: Bambi Lever A  Department: [facility]  Visit Type: OFFICE VISIT  Date: 04/19/2023  Medication: LANTUS  SOLOSTAR 100 UNIT/ML Solostar Pen  Has the patient contacted their pharmacy? Yes (Agent: If no, request that the patient contact the pharmacy for the refill. If patient does not wish to contact the pharmacy document the reason why and proceed with request.) (Agent: If yes, when and what did the pharmacy advise?)  Is this the correct pharmacy for this prescription? Yes If no, delete pharmacy and type the correct one.  This is the patient's preferred pharmacy:   [id number] [place], [place] - [address] [place] [address] Phone: [phone] Fax: [phone]   Has the prescription been filled recently? No  Is the patient out of the medication? Yes  Has the patient been seen for an appointment in the last year OR does the patient have an upcoming appointment? Yes  Can we respond through MyChart? Yes  Agent: Please be advised that Rx refills may take up to 3 business days. We ask that you follow-up with your pharmacy.

## 2023-09-23 NOTE — Telephone Encounter (Signed)
 Pharmacy Patient Advocate Encounter  Received notification from [facility] that Prior Authorization for REPATHA  has been APPROVED from 07/30/23 to 09/22/24. Ran test claim, Copay is $425.05. This test claim was processed through [facility]- copay amounts may vary at other pharmacies due to pharmacy/plan contracts, or as the patient moves through the different stages of their insurance plan.

## 2023-09-26 ENCOUNTER — Other Ambulatory Visit (HOSPITAL_COMMUNITY): Payer: Self-pay

## 2023-09-26 ENCOUNTER — Telehealth: Payer: Self-pay

## 2023-09-26 NOTE — Telephone Encounter (Signed)
 Pharmacy Patient Advocate Encounter  Received notification from  [facility]  that Prior Authorization for Lantus  Solostar has been APPROVED from 07/30/2023 to 09/23/2024   PA #/Case ID/Reference #: Please see approval letter in media

## 2023-09-26 NOTE — Telephone Encounter (Signed)
 I haven't sent her anything yet. [name], [facility] said he was [facility] so was holding off till RX was sent.

## 2023-09-27 ENCOUNTER — Telehealth: Payer: Self-pay

## 2023-09-27 ENCOUNTER — Telehealth: Payer: Self-pay | Admitting: Internal Medicine

## 2023-09-27 ENCOUNTER — Telehealth: Payer: Self-pay | Admitting: Pharmacy Technician

## 2023-09-27 ENCOUNTER — Other Ambulatory Visit (HOSPITAL_COMMUNITY): Payer: Self-pay

## 2023-09-27 ENCOUNTER — Other Ambulatory Visit (HOSPITAL_BASED_OUTPATIENT_CLINIC_OR_DEPARTMENT_OTHER): Payer: Self-pay

## 2023-09-27 MED ORDER — REPATHA SURECLICK 140 MG/ML ~~LOC~~ SOAJ
140.0000 mg | SUBCUTANEOUS | 3 refills | Status: DC
  Filled 2023-09-27 – 2023-10-07 (×2): qty 2, 28d supply, fill #0

## 2023-09-27 NOTE — Telephone Encounter (Signed)
 Returned patients phone call in regards with medication. Left v/m. Patient was approved for free drug and medication has arrived.   Patient has upcoming appt 09/29/23.

## 2023-09-27 NOTE — Telephone Encounter (Signed)
 Copied from CRM [id number]. Topic: General - Call Back - No Documentation >> Sep 27, 2023  2:37 PM Alpha Arts wrote: Reason for CRM: Arizona La from [employer] would like to know if faxed paperwork for lab request was received.   Callback: [phone] Fax: [phone]

## 2023-09-27 NOTE — Telephone Encounter (Signed)
 Patient needs to be seen first before provider can sign off on this due to patient needing her diabetes checked

## 2023-09-27 NOTE — Telephone Encounter (Signed)
 Called pt last week, no answer, left voicemail. Noted today that cardio was able to get a grant approved for her Repatha . No action needed.  Rainelle Bur, PharmD, BCPS, CPP Clinical Pharmacist Practitioner [facility] [phone]

## 2023-09-27 NOTE — Telephone Encounter (Signed)
 Pharmacy Patient Advocate Encounter  Received notification from  [facility]  that Tier Exception for Repatha  has been DENIED.  Full denial letter will be uploaded to the media tab. See denial reason below.

## 2023-09-27 NOTE — Telephone Encounter (Signed)
 Pharmacy Patient Advocate Encounter  Received notification from  [facility]  that Tier Exception for Jardiance  has been DENIED.  Full denial letter will be uploaded to the media tab. See denial reason below.

## 2023-09-28 ENCOUNTER — Telehealth: Payer: Self-pay | Admitting: Internal Medicine

## 2023-09-28 NOTE — Telephone Encounter (Signed)
 Copied from CRM [id number]. Topic: Clinical - Prescription Issue >> Sep 28, 2023  1:20 PM [name] wrote: Reason for CRM: patient stated that LANTUS  SOLOSTAR 100 UNIT/ML Solostar Pen is to expensive and she wants to be recommended a cheaper medication that she can afford. Patient stated that she needs a call back as soon as possible

## 2023-09-29 ENCOUNTER — Encounter: Payer: Self-pay | Admitting: Gastroenterology

## 2023-09-29 ENCOUNTER — Ambulatory Visit (INDEPENDENT_AMBULATORY_CARE_PROVIDER_SITE_OTHER): Payer: Self-pay

## 2023-09-29 VITALS — BP 132/74 | HR 72 | Temp 98.4°F | Resp 16 | Ht 63.0 in | Wt 136.4 lb

## 2023-09-29 DIAGNOSIS — K5010 Crohn's disease of large intestine without complications: Secondary | ICD-10-CM | POA: Diagnosis not present

## 2023-09-29 MED ORDER — RISANKIZUMAB-RZAA 600 MG/10ML IV SOLN
600.0000 mg | Freq: Once | INTRAVENOUS | Status: AC
  Administered 2023-09-29: 600 mg via INTRAVENOUS
  Filled 2023-09-29: qty 10

## 2023-09-29 NOTE — Progress Notes (Signed)
 Diagnosis: Crohn's Disease  Provider:  Praveen Mannam MD  Procedure: IV Infusion  IV Type: Peripheral, IV Location: L Forearm  Skyrizi  (risankizumab -rzaa), Dose: 600 mg  Infusion Start Time: 1505  Infusion Stop Time: 1620  Post Infusion IV Care: Observation period completed  Discharge: Condition: Good, Destination: Home . AVS Declined  Performed by:  Shirly Dow, RN

## 2023-09-29 NOTE — Telephone Encounter (Signed)
 This can be discussed during her visit on 5/5

## 2023-09-30 ENCOUNTER — Other Ambulatory Visit: Payer: Self-pay | Admitting: Internal Medicine

## 2023-09-30 NOTE — Telephone Encounter (Signed)
 Copied from CRM [id number]. Topic: General - Other >> Sep 30, 2023  2:15 PM Adrionna Y wrote: Reason for CRM:   Hazel from [employer] calling to see if forms received. Arizona La stating a doctor appointment is not needed for the forms to be filled out. she can be reached at  Callback: [phone] Fax: [phone]   for further information

## 2023-10-02 DIAGNOSIS — H1032 Unspecified acute conjunctivitis, left eye: Secondary | ICD-10-CM | POA: Diagnosis not present

## 2023-10-02 DIAGNOSIS — N390 Urinary tract infection, site not specified: Secondary | ICD-10-CM | POA: Diagnosis not present

## 2023-10-02 DIAGNOSIS — H1031 Unspecified acute conjunctivitis, right eye: Secondary | ICD-10-CM | POA: Diagnosis not present

## 2023-10-02 DIAGNOSIS — R300 Dysuria: Secondary | ICD-10-CM | POA: Diagnosis not present

## 2023-10-03 ENCOUNTER — Ambulatory Visit: Admitting: Internal Medicine

## 2023-10-04 NOTE — Telephone Encounter (Signed)
 Yes this has been signed and fax back over

## 2023-10-06 NOTE — Telephone Encounter (Signed)
 Hazel from [employer] called again inquiring about a lab requisition that needed to be signed and faxed back. Arizona La was informed that information was signed and faxed on 10/04/23. Caller states she did not receive document and requested document be re-faxed to [phone] preferably today or at latest tomorrow.

## 2023-10-06 NOTE — Telephone Encounter (Signed)
 There are two forms for two different things. I was referring to the free style libra which has been sent in but for this diagnostics this cannot be signed

## 2023-10-06 NOTE — Telephone Encounter (Signed)
 Dr Nicolette Barrio does not order this cannot be signed

## 2023-10-07 ENCOUNTER — Other Ambulatory Visit (HOSPITAL_BASED_OUTPATIENT_CLINIC_OR_DEPARTMENT_OTHER): Payer: Self-pay

## 2023-10-07 ENCOUNTER — Other Ambulatory Visit (HOSPITAL_COMMUNITY): Payer: Self-pay

## 2023-10-07 NOTE — Telephone Encounter (Signed)
 Copied from CRM [id number]. Topic: General - Other >> Oct 07, 2023 10:15 AM Dyann Glaser G wrote: Reason for CRM: ADVISED HAZEL WITH [employer] THAT THE PROVIDER WILL NOT SIGN THE FORMS. STATED SHE JUST NEEDS SOMETHING ON THE FORM STATING THE PROVIDER IS DENYING TO SIGN THE ORDER.

## 2023-10-08 DIAGNOSIS — R0981 Nasal congestion: Secondary | ICD-10-CM | POA: Diagnosis not present

## 2023-10-08 DIAGNOSIS — J069 Acute upper respiratory infection, unspecified: Secondary | ICD-10-CM | POA: Diagnosis not present

## 2023-10-08 DIAGNOSIS — R0982 Postnasal drip: Secondary | ICD-10-CM | POA: Diagnosis not present

## 2023-10-08 DIAGNOSIS — R051 Acute cough: Secondary | ICD-10-CM | POA: Diagnosis not present

## 2023-10-10 ENCOUNTER — Encounter (HOSPITAL_COMMUNITY): Payer: Self-pay

## 2023-10-10 DIAGNOSIS — M79672 Pain in left foot: Secondary | ICD-10-CM | POA: Diagnosis not present

## 2023-10-10 DIAGNOSIS — S93602A Unspecified sprain of left foot, initial encounter: Secondary | ICD-10-CM | POA: Diagnosis not present

## 2023-10-11 ENCOUNTER — Ambulatory Visit: Payer: Medicare Other | Admitting: Gastroenterology

## 2023-10-13 ENCOUNTER — Ambulatory Visit: Payer: Self-pay | Admitting: Gastroenterology

## 2023-10-14 NOTE — Telephone Encounter (Signed)
 This will need to be fax back over again and I will inform the provider

## 2023-10-17 ENCOUNTER — Encounter: Payer: Self-pay | Admitting: Internal Medicine

## 2023-10-17 ENCOUNTER — Ambulatory Visit (INDEPENDENT_AMBULATORY_CARE_PROVIDER_SITE_OTHER): Admitting: Internal Medicine

## 2023-10-17 VITALS — BP 120/80 | HR 79 | Temp 97.5°F | Ht 63.0 in | Wt 135.0 lb

## 2023-10-17 DIAGNOSIS — Z794 Long term (current) use of insulin: Secondary | ICD-10-CM

## 2023-10-17 DIAGNOSIS — Z7984 Long term (current) use of oral hypoglycemic drugs: Secondary | ICD-10-CM | POA: Diagnosis not present

## 2023-10-17 DIAGNOSIS — E118 Type 2 diabetes mellitus with unspecified complications: Secondary | ICD-10-CM

## 2023-10-17 LAB — POCT GLYCOSYLATED HEMOGLOBIN (HGB A1C): HbA1c POC (<> result, manual entry): 8.9 % (ref 4.0–5.6)

## 2023-10-17 MED ORDER — GLIMEPIRIDE 4 MG PO TABS: 4.0000 mg | ORAL_TABLET | Freq: Every day | ORAL | 3 refills | Status: AC

## 2023-10-17 NOTE — Patient Instructions (Signed)
 We will increase the glimepiride  to 4 mg. If you have them left at home take 2 pills daily until gone (as yours as 2 mg)

## 2023-10-17 NOTE — Assessment & Plan Note (Signed)
 POC HgA1c done today and worse at 8.9%. She is out of meds for a bit and off jardiance . Increase amaryl  to 4 mg daily and follow up 3 months. Continue lantus  58 units daily and metformin  as well.

## 2023-10-17 NOTE — Progress Notes (Signed)
   Subjective:   Patient ID: [name], female    DOB: [birth year], 63 y.o.   MRN: [id number]  HPI The patient is a 63 YO female coming in for diabetes follow up. Out of jardiance  due to cost and out of several meds for 3 weeks due to insurance change this calendar year.   Review of Systems  Constitutional: Negative.   HENT: Negative.    Eyes: Negative.   Respiratory:  Negative for cough, chest tightness and shortness of breath.   Cardiovascular:  Negative for chest pain, palpitations and leg swelling.  Gastrointestinal:  Positive for abdominal pain and diarrhea. Negative for abdominal distention, constipation, nausea and vomiting.  Musculoskeletal: Negative.   Skin: Negative.   Neurological: Negative.   Psychiatric/Behavioral: Negative.      Objective:  Physical Exam Constitutional:      Appearance: She is well-developed.  HENT:     Head: Normocephalic and atraumatic.  Cardiovascular:     Rate and Rhythm: Normal rate and regular rhythm.  Pulmonary:     Effort: Pulmonary effort is normal. No respiratory distress.     Breath sounds: Normal breath sounds. No wheezing or rales.  Abdominal:     General: Bowel sounds are normal. There is no distension.     Palpations: Abdomen is soft.     Tenderness: There is no abdominal tenderness. There is no rebound.  Musculoskeletal:     Cervical back: Normal range of motion.  Skin:    General: Skin is warm and dry.  Neurological:     Mental Status: She is alert and oriented to person, place, and time.     Coordination: Coordination normal.     Vitals:   10/17/23 1100  BP: 120/80  Pulse: 79  Temp: (!) 97.5 F (36.4 C)  TempSrc: Oral  SpO2: 98%  Weight: 135 lb (61.2 kg)  Height: 5\' 3"  (1.6 m)    Assessment & Plan:

## 2023-10-23 DIAGNOSIS — R051 Acute cough: Secondary | ICD-10-CM | POA: Diagnosis not present

## 2023-10-23 DIAGNOSIS — J101 Influenza due to other identified influenza virus with other respiratory manifestations: Secondary | ICD-10-CM | POA: Diagnosis not present

## 2023-10-23 DIAGNOSIS — R110 Nausea: Secondary | ICD-10-CM | POA: Diagnosis not present

## 2023-10-23 DIAGNOSIS — R197 Diarrhea, unspecified: Secondary | ICD-10-CM | POA: Diagnosis not present

## 2023-10-23 DIAGNOSIS — R5381 Other malaise: Secondary | ICD-10-CM | POA: Diagnosis not present

## 2023-10-24 ENCOUNTER — Telehealth: Payer: Self-pay | Admitting: Gastroenterology

## 2023-10-24 ENCOUNTER — Emergency Department (HOSPITAL_COMMUNITY)

## 2023-10-24 ENCOUNTER — Emergency Department (HOSPITAL_COMMUNITY)
Admission: EM | Admit: 2023-10-24 | Discharge: 2023-10-24 | Disposition: A | Discharge status: Home / Self Care | Attending: Emergency Medicine | Admitting: Emergency Medicine

## 2023-10-24 ENCOUNTER — Other Ambulatory Visit: Payer: Self-pay

## 2023-10-24 DIAGNOSIS — Z7982 Long term (current) use of aspirin: Secondary | ICD-10-CM | POA: Diagnosis not present

## 2023-10-24 DIAGNOSIS — R5383 Other fatigue: Secondary | ICD-10-CM | POA: Diagnosis not present

## 2023-10-24 DIAGNOSIS — Z794 Long term (current) use of insulin: Secondary | ICD-10-CM | POA: Insufficient documentation

## 2023-10-24 DIAGNOSIS — Z7984 Long term (current) use of oral hypoglycemic drugs: Secondary | ICD-10-CM | POA: Insufficient documentation

## 2023-10-24 DIAGNOSIS — Z79899 Other long term (current) drug therapy: Secondary | ICD-10-CM | POA: Insufficient documentation

## 2023-10-24 DIAGNOSIS — I10 Essential (primary) hypertension: Secondary | ICD-10-CM | POA: Insufficient documentation

## 2023-10-24 DIAGNOSIS — R197 Diarrhea, unspecified: Secondary | ICD-10-CM

## 2023-10-24 DIAGNOSIS — R1031 Right lower quadrant pain: Secondary | ICD-10-CM | POA: Diagnosis not present

## 2023-10-24 DIAGNOSIS — M791 Myalgia, unspecified site: Secondary | ICD-10-CM | POA: Diagnosis not present

## 2023-10-24 DIAGNOSIS — E119 Type 2 diabetes mellitus without complications: Secondary | ICD-10-CM | POA: Diagnosis not present

## 2023-10-24 DIAGNOSIS — K5090 Crohn's disease, unspecified, without complications: Secondary | ICD-10-CM | POA: Diagnosis not present

## 2023-10-24 DIAGNOSIS — R1032 Left lower quadrant pain: Secondary | ICD-10-CM | POA: Insufficient documentation

## 2023-10-24 DIAGNOSIS — A09 Infectious gastroenteritis and colitis, unspecified: Secondary | ICD-10-CM | POA: Diagnosis not present

## 2023-10-24 DIAGNOSIS — R509 Fever, unspecified: Secondary | ICD-10-CM | POA: Diagnosis not present

## 2023-10-24 LAB — CBC
HCT: 45.2 % (ref 36.0–46.0)
Hemoglobin: 14.7 g/dL (ref 12.0–15.0)
MCH: 29.5 pg (ref 26.0–34.0)
MCHC: 32.5 g/dL (ref 30.0–36.0)
MCV: 90.8 fL (ref 80.0–100.0)
Platelets: 261 10*3/uL (ref 150–400)
RBC: 4.98 MIL/uL (ref 3.87–5.11)
RDW: 13.2 % (ref 11.5–15.5)
WBC: 6.8 10*3/uL (ref 4.0–10.5)
nRBC: 0 % (ref 0.0–0.2)

## 2023-10-24 LAB — COMPREHENSIVE METABOLIC PANEL WITH GFR
ALT: 28 U/L (ref 0–44)
AST: 38 U/L (ref 15–41)
Albumin: 3.5 g/dL (ref 3.5–5.0)
Alkaline Phosphatase: 84 U/L (ref 38–126)
Anion gap: 12 (ref 5–15)
BUN: 20 mg/dL (ref 8–23)
CO2: 19 mmol/L — ABNORMAL LOW (ref 22–32)
Calcium: 8.6 mg/dL — ABNORMAL LOW (ref 8.9–10.3)
Chloride: 103 mmol/L (ref 98–111)
Creatinine, Ser: 0.89 mg/dL (ref 0.44–1.00)
GFR, Estimated: >60 mL/min (ref >60)
Glucose, Bld: 244 mg/dL — ABNORMAL HIGH (ref 70–99)
Potassium: 3.4 mmol/L — ABNORMAL LOW (ref 3.5–5.1)
Sodium: 134 mmol/L — ABNORMAL LOW (ref 135–145)
Total Bilirubin: 0.8 mg/dL (ref 0.0–1.2)
Total Protein: 6.7 g/dL (ref 6.5–8.1)

## 2023-10-24 LAB — URINALYSIS, ROUTINE W REFLEX MICROSCOPIC
Bilirubin Urine: NEGATIVE
Glucose, UA: >=500 mg/dL — AB
Hgb urine dipstick: NEGATIVE
Ketones, ur: NEGATIVE mg/dL
Leukocytes,Ua: NEGATIVE
Nitrite: NEGATIVE
Protein, ur: 30 mg/dL — AB
Specific Gravity, Urine: 1.011 (ref 1.005–1.030)
pH: 5 (ref 5.0–8.0)

## 2023-10-24 LAB — LIPASE, BLOOD: Lipase: 36 U/L (ref 11–51)

## 2023-10-24 MED ORDER — ONDANSETRON HCL 4 MG/2ML IJ SOLN
4.0000 mg | Freq: Once | INTRAMUSCULAR | Status: AC
  Administered 2023-10-24: 4 mg via INTRAVENOUS
  Filled 2023-10-24: qty 2

## 2023-10-24 MED ORDER — LOPERAMIDE HCL 2 MG PO CAPS
2.0000 mg | ORAL_CAPSULE | Freq: Four times a day (QID) | ORAL | 0 refills | Status: AC | PRN
Start: 2023-10-24 — End: ?

## 2023-10-24 MED ORDER — LOPERAMIDE HCL 2 MG PO CAPS: 2.0000 mg | ORAL_CAPSULE | Freq: Four times a day (QID) | ORAL | 0 refills | Status: DC | PRN

## 2023-10-24 MED ORDER — SODIUM CHLORIDE (PF) 0.9 % IJ SOLN
INTRAMUSCULAR | Status: AC
  Filled 2023-10-24: qty 50

## 2023-10-24 MED ORDER — IOHEXOL 300 MG/ML  SOLN
100.0000 mL | Freq: Once | INTRAMUSCULAR | Status: AC | PRN
  Administered 2023-10-24: 100 mL via INTRAVENOUS

## 2023-10-24 MED ORDER — LACTATED RINGERS IV BOLUS
1000.0000 mL | Freq: Once | INTRAVENOUS | Status: AC
  Administered 2023-10-24: 1000 mL via INTRAVENOUS

## 2023-10-24 MED ORDER — MORPHINE SULFATE (PF) 4 MG/ML IV SOLN
4.0000 mg | Freq: Once | INTRAVENOUS | Status: AC
  Administered 2023-10-24: 4 mg via INTRAVENOUS
  Filled 2023-10-24: qty 1

## 2023-10-24 NOTE — ED Triage Notes (Signed)
 Pt reports diarrhea, chills, fever starting Friday afternoon. Minimal PO intake. Took dicyclomine  this morning

## 2023-10-24 NOTE — Discharge Instructions (Signed)
 Take the medications to help with the diarrhea.  Follow-up with your GI doctor to be rechecked.

## 2023-10-24 NOTE — ED Notes (Signed)
 Patient was unable to void after going into the restroom but is aware a urine sample is needed, so the urine has not been collected yet.

## 2023-10-24 NOTE — ED Provider Notes (Signed)
 [place] EMERGENCY DEPARTMENT AT [facility] Provider Note   CSN: [id number] Arrival date & time: 10/24/23  1053     History  Chief Complaint  Patient presents with   Diarrhea    [name] is a 63 y.o. female.   Diarrhea    Patient has history of depression palpitations fibromyalgia Crohn's disease, hypertension, diabetes.  Patient states she started having trouble with diarrhea on Friday.  Patient has had multiple episodes 15-20.  Patient states she also started having chills and fever up to 100.  She has not had any vomiting but has had decreased appetite.  Has had bodyaches and fatigue.  Patient states she went to an urgent care the other day.  They did covid testing that was negative.  Patient states her symptoms persisted so she came to the ED today.  She is also having pain in her lower abdomen.  It hurts more when she asked about the bowel movement but otherwise persists throughout.  Patient denies any history of abdominal surgery  Home Medications Prior to Admission medications   Medication Sig Start Date End Date Taking? Authorizing Provider  loperamide (IMODIUM) 2 MG capsule Take 1 capsule (2 mg total) by mouth 4 (four) times daily as needed for diarrhea or loose stools. 10/24/23  Yes Trish Furl, MD  acetaminophen  (TYLENOL ) 650 MG CR tablet Take 650 mg by mouth every 8 (eight) hours as needed for pain.    [provider]  albuterol  (PROVENTIL ) (2.5 MG/3ML) 0.083% nebulizer solution Take 2.5 mg by nebulization every 4 (four) hours as needed for wheezing or shortness of breath.     [provider]  albuterol  (VENTOLIN  HFA) 108 (90 Base) MCG/ACT inhaler Inhale 1-2 puffs into the lungs every 6 (six) hours as needed for wheezing or shortness of breath.    [provider]  amoxicillin-clavulanate (AUGMENTIN) 875-125 MG tablet Take 1 tablet by mouth 2 (two) times daily. 10/08/23   [provider]  aspirin  EC 81 MG tablet Take 81 mg  by mouth daily. Swallow whole.    [provider]  Blood Glucose Monitoring Suppl (FREESTYLE LITE) w/Device KIT Use to check blood sugar up to 4 times a day as directed 03/10/21   Adelia Homestead, MD  budesonide  (ENTOCORT EC ) 3 MG 24 hr capsule Take 3 capsules (9 mg total) by mouth daily. 12/30/22   Nandigam, Kavitha V, MD  colestipol  (COLESTID ) 1 g tablet TAKE 1 TABLET BY MOUTH TWICE A DAY 07/26/23   Nandigam, Kavitha V, MD  Continuous Blood Gluc Sensor (FREESTYLE LIBRE 14 DAY SENSOR) MISC Use to monitor sugars 04/03/21   Adelia Homestead, MD  Continuous Blood Gluc Sensor (FREESTYLE LIBRE SENSOR SYSTEM) MISC Use for blood sugar monitoring 04/03/21   Adelia Homestead, MD  cyanocobalamin  (VITAMIN B12) 1000 MCG/ML injection Inject 1 mL (1,000 mcg total) into the muscle every 30 (thirty) days. 12/30/22   Nandigam, Kavitha V, MD  dicyclomine  (BENTYL ) 20 MG tablet Take 1 tablet (20 mg total) by mouth every 6 (six) hours. 06/02/21   Kenney Peacemaker, MD  DULoxetine  (CYMBALTA ) 60 MG capsule Take 1 capsule (60 mg total) by mouth daily. 08/24/23   Adelia Homestead, MD  enalapril  (VASOTEC ) 10 MG tablet Take 1 tablet (10 mg) by mouth daily. 08/25/23   Tolia, Sunit, DO  ergocalciferol  (VITAMIN D2) 1.25 MG (50000 UT) capsule Take 1 capsule (50,000 Units total) by mouth once a week. 08/30/23   Adelia Homestead,  MD  Evolocumab  (REPATHA  SURECLICK) 140 MG/ML SOAJ Inject 140 mg into the skin every 14 (fourteen) days. 09/27/23   Tolia, Sunit, DO  fenofibrate  (TRICOR ) 145 MG tablet TAKE 1 TABLET BY MOUTH EVERY DAY 08/29/23   Tolia, Sunit, DO  furosemide  (LASIX ) 40 MG tablet Take 1 tablet (40 mg total) by mouth daily as needed for fluid or edema. Patient not taking: Reported on 10/17/2023 12/29/22   Sonny Dust, MD  glimepiride  (AMARYL ) 4 MG tablet Take 1 tablet (4 mg total) by mouth daily before breakfast. 10/17/23   Adelia Homestead, MD  glucose blood [facility] ULTRA) test strip USE AS  DIRECTED 09/19/23   Adelia Homestead, MD  insulin  glargine (LANTUS  SOLOSTAR) 100 UNIT/ML Solostar Pen INJECT 58 UNITS SUBCUTANEOUSLY EVERY DAY 10/03/23   Adelia Homestead, MD  Insulin  Pen Needle (PEN NEEDLES 3/16") 31G X 5 MM MISC Use daily 01/11/23   Nandigam, Kavitha V, MD  Lancets (FREESTYLE) lancets Use to check blood sugar up to 4 times a day as directed 03/10/21   Adelia Homestead, MD  levocetirizine (XYZAL ) 5 MG tablet Take 1 tablet  by mouth every evening. 08/24/23   Adelia Homestead, MD  metFORMIN  (GLUCOPHAGE -XR) 500 MG 24 hr tablet Take 2 tablets by mouth daily. 08/24/23   Adelia Homestead, MD  metoprolol  succinate (TOPROL -XL) 25 MG 24 hr tablet Take 1.5 tablets (37.5 mg total) by mouth daily. 08/18/23   Gerald Kitty., NP  montelukast  (SINGULAIR ) 10 MG tablet Take 1 tablet (10 mg total) by mouth every evening. 08/24/23   Adelia Homestead, MD  Multiple Vitamins-Minerals (MULTIVITAMIN WITH MINERALS) tablet Take 1 tablet by mouth daily.    [provider]  nitroGLYCERIN  (NITROSTAT ) 0.4 MG SL tablet Place 1 tablet (0.4 mg total) under the tongue every 5 (five) minutes as needed for chest pain.  Call 911 if no relief after 1st dose. 08/25/23   Tolia, Sunit, DO  ondansetron  (ZOFRAN  ODT) 4 MG disintegrating tablet Take 1 tablet (4 mg total) by mouth every 4 (four) hours as needed for nausea or vomiting. 10/18/20   Wynetta Heckle, MD  pantoprazole  (PROTONIX ) 40 MG tablet Take 1 tablet (40 mg total) by mouth daily. 12/30/22   Nandigam, Kavitha V, MD  Probiotic Product (PROBIOTIC DAILY PO) Take 1 capsule by mouth daily.    [provider]  SKYRIZI  360 MG/2.4ML SOCT 360 mg SQ injection by cartridge and repeat every 8 weeks 03/17/23   Nandigam, Kavitha V, MD  sodium chloride  (OCEAN) 0.65 % SOLN nasal spray Place 1 spray into both nostrils daily as needed for congestion.    [provider]  sucralfate  (CARAFATE ) 1 g tablet Take 1 tablet (1 g total) by  mouth 4 (four) times daily -  with meals and at bedtime. 11/24/22   Nandigam, Kavitha V, MD  VASCEPA  1 g capsule Take 2 capsules by mouth 2 times daily. 12/29/22   Sonny Dust, MD  Vitamin D3 (VITAMIN D ) 25 MCG tablet Take 1,000 Units by mouth at bedtime.    [provider]  EPIPEN  2-PAK 0.3 MG/0.3ML SOAJ injection Inject 0.3 mg into the muscle as needed for anaphylaxis. Reported on 06/27/2015 10/09/14   [provider]      Allergies    Lipitor [atorvastatin ], Remicade [infliximab], Rosuvastatin , and Ticagrelor     Review of Systems   Review of Systems  Gastrointestinal:  Positive for diarrhea.    Physical Exam Updated Vital Signs BP Aaron Aas)  104/58   Pulse 79   Temp 99.3 F (37.4 C) (Oral)   Resp 16   LMP  (LMP Unknown)   SpO2 97%  Physical Exam Vitals and nursing note reviewed.  Constitutional:      General: She is not in acute distress.    Appearance: She is well-developed.  HENT:     Head: Normocephalic and atraumatic.     Right Ear: External ear normal.     Left Ear: External ear normal.  Eyes:     General: No scleral icterus.       Right eye: No discharge.        Left eye: No discharge.     Conjunctiva/sclera: Conjunctivae normal.  Neck:     Trachea: No tracheal deviation.  Cardiovascular:     Rate and Rhythm: Normal rate and regular rhythm.  Pulmonary:     Effort: Pulmonary effort is normal. No respiratory distress.     Breath sounds: Normal breath sounds. No stridor. No wheezing or rales.  Abdominal:     General: Bowel sounds are normal. There is no distension.     Palpations: Abdomen is soft.     Tenderness: There is abdominal tenderness in the right lower quadrant, suprapubic area and left lower quadrant. There is no guarding or rebound.  Musculoskeletal:        General: No tenderness or deformity.     Cervical back: Neck supple.  Skin:    General: Skin is warm and dry.     Findings: No rash.  Neurological:     General: No focal  deficit present.     Mental Status: She is alert.     Cranial Nerves: No cranial nerve deficit, dysarthria or facial asymmetry.     Sensory: No sensory deficit.     Motor: No abnormal muscle tone or seizure activity.     Coordination: Coordination normal.  Psychiatric:        Mood and Affect: Mood normal.     ED Results / Procedures / Treatments   Labs (all labs ordered are listed, but only abnormal results are displayed) Labs Reviewed  COMPREHENSIVE METABOLIC PANEL WITH GFR - Abnormal; Notable for the following components:      Result Value   Sodium 134 (*)    Potassium 3.4 (*)    CO2 19 (*)    Glucose, Bld 244 (*)    Calcium  8.6 (*)    All other components within normal limits  URINALYSIS, ROUTINE W REFLEX MICROSCOPIC - Abnormal; Notable for the following components:   APPearance HAZY (*)    Glucose, UA >=500 (*)    Protein, ur 30 (*)    Bacteria, UA RARE (*)    All other components within normal limits  LIPASE, BLOOD  CBC    EKG None  Radiology CT ABDOMEN PELVIS W CONTRAST Result Date: 10/24/2023 CLINICAL DATA:  Left lower quadrant pain and diarrhea for several days. Chills and fever. Crohn disease. EXAM: CT ABDOMEN AND PELVIS WITH CONTRAST TECHNIQUE: Multidetector CT imaging of the abdomen and pelvis was performed using the standard protocol following bolus administration of intravenous contrast. RADIATION DOSE REDUCTION: This exam was performed according to the departmental dose-optimization program which includes automated exposure control, adjustment of the mA and/or kV according to patient size and/or use of iterative reconstruction technique. CONTRAST:  OMNIPAQUE  IOHEXOL  300 MG/ML  SOLN COMPARISON:  Abdomen only CT on 07/13/2023 FINDINGS: Lower Chest: No acute findings. Hepatobiliary: No suspicious hepatic masses identified.  Mild diffuse hepatic steatosis. Prior cholecystectomy. No evidence of biliary obstruction. Pancreas:  No mass or inflammatory changes.  Spleen: Within normal limits in size and appearance. Adrenals/Urinary Tract: No suspicious masses identified. No evidence of ureteral calculi or hydronephrosis. Unremarkable unopacified urinary bladder. Stomach/Bowel: No evidence of obstruction, inflammatory process or abnormal fluid collections. Normal appendix visualized. Vascular/Lymphatic: No pathologically enlarged lymph nodes. No acute vascular findings. Reproductive:  No mass or other significant abnormality. Other:  None. Musculoskeletal: No suspicious bone lesions identified. Severe degenerative disc disease noted at L1-2. IMPRESSION: No acute findings. Mild hepatic steatosis. Electronically Signed   By: Marlyce Sine M.D.   On: 10/24/2023 14:57    Procedures Procedures    Medications Ordered in ED Medications  lactated ringers  bolus 1,000 mL (0 mLs Intravenous Stopped 10/24/23 1505)  morphine  (PF) 4 MG/ML injection 4 mg (4 mg Intravenous Given 10/24/23 1345)  ondansetron  (ZOFRAN ) injection 4 mg (4 mg Intravenous Given 10/24/23 1345)  iohexol  (OMNIPAQUE ) 300 MG/ML solution 100 mL (100 mLs Intravenous Contrast Given 10/24/23 1430)    ED Course/ Medical Decision Making/ A&P Clinical Course as of 10/24/23 1656  Mon Oct 24, 2023  1332 CBC CBC normal.  Lipase normal.  Metabolic panel without significant abnormalities.  Urinalysis negative. [JK]  1546 CT scan abdomen pelvis without acute findings [JK]  1625 Case discussed with [facility].  OK for outpt follow up .  Sx likely infectious in nature. [JK]    Clinical Course User Index [JK] Trish Furl, MD                                 Medical Decision Making Amount and/or Complexity of Data Reviewed Labs: ordered. Decision-making details documented in ED Course. Radiology: ordered.  Risk Prescription drug management.   Patient presented to the ED for evaluation of diarrhea and abdominal pain.  Patient's ED workup is reassuring.  She does not have any evidence of pancreatitis or  hepatitis.  Urinalysis does not suggest infection.  Patient was treated with IV fluids and pain medications.  CT scan was performed and does not show any acute abnormality.  Suspect possible viral gastritis illness.  I discussed the case with [facility].  Symptoms not likely related to her Crohn's.  Her recent colonoscopy did not show any active disease.  Will discharge home with medication, Imodium.  She will follow-up with her GI doctor as an outpatient.       Final Clinical Impression(s) / ED Diagnoses Final diagnoses:  Diarrhea of presumed infectious origin    Rx / DC Orders ED Discharge Orders          Ordered    loperamide (IMODIUM) 2 MG capsule  4 times daily PRN        10/24/23 1628              Trish Furl, MD 10/24/23 1656

## 2023-10-24 NOTE — Telephone Encounter (Signed)
 Call from patient to on-call service  Patient reports diarrhea, abdominal pain, fever, chills, weakness, fatigue ongoing since Friday.  She reported to the ED earlier today and is currently at [facility]. She is calling from their ED.  Advised that ED will pursue workup on her and if they need to consult us  we are happy to see her, otherwise recommend continuing with ED workup and we can follow-up outpatient based on their recommendations.  Suspect infectious etiology.

## 2023-10-25 ENCOUNTER — Ambulatory Visit (HOSPITAL_COMMUNITY)
Admission: RE | Admit: 2023-10-25 | Discharge: 2023-10-25 | Disposition: A | Discharge status: Home / Self Care | Source: Ambulatory Visit | Attending: Cardiology | Admitting: Cardiology

## 2023-10-25 ENCOUNTER — Ambulatory Visit: Payer: Self-pay | Admitting: Cardiology

## 2023-10-25 DIAGNOSIS — I2510 Atherosclerotic heart disease of native coronary artery without angina pectoris: Secondary | ICD-10-CM | POA: Diagnosis present

## 2023-10-25 LAB — ECHOCARDIOGRAM COMPLETE
Area-P 1/2: 3.47 cm2
S' Lateral: 1.7 cm

## 2023-10-31 NOTE — Telephone Encounter (Signed)
 Patient calls indicating that she is still having diarrhea following her emergency room visit. States she is having 4-5 bowel movements daily; describes these alternating between loose and watery. No blood or mucus noted at this time. +gas; also complains of sharp, intermittent epigastric pain (was originally in the lower abdomen). Patient denies any fever. She was given imodium  at the hospital and states that this has been helpful but she is out of the medication.  Patient is advised that she should purchase additional imodium  over the counter and can take up to 8 tablets max daily. Patient is advised to push fluids and get at least 64 oz water in daily. Advised that she eat a bland diet for now and can advance as tolerated. Patient to make us  aware if symptoms continue despite these recommendations.  Patient had colonoscopy 08/2023 without inflammation; CT 10/24/23 showed no acute findings.

## 2023-10-31 NOTE — Telephone Encounter (Signed)
 Inbound call from patient, states she went to the ED, but is still having the ongoing diarrhea. She states she is out of the medication that the hospital prescribed. She would like to speak to a nurse to further advise.

## 2023-11-07 MED ORDER — COLESTIPOL HCL 1 G PO TABS: 1.0000 g | ORAL_TABLET | Freq: Three times a day (TID) | ORAL | 1 refills | Status: DC

## 2023-11-07 NOTE — Telephone Encounter (Signed)
 Please advise patient to start Colestid  1gm TID with meals, avoid taking it within 2 hours of other meds. Also provide samples for Creon 72000 units TID or Zenpep 60000 units TID for possible IBS- diarrhea/pancreatic insufficiency, if has noticeable improvement will send Rx. Please schedule office f/u visit with me next available. Thanks

## 2023-11-07 NOTE — Addendum Note (Signed)
 Addended by: Glennette Lanius on: 11/07/2023 11:17 AM   Modules accepted: Orders

## 2023-11-07 NOTE — Telephone Encounter (Signed)
 Contacted patient. She continues to have diarrhea if she does not take scheduled imodium . Patient is advised that per Dr Leonia Raman, we can try her on colestid  three times daily with meals (avoiding other medications within 2 hours of taking) and can also provide samples of Creon for her to take 36000 units, 2 capsules three times daily. Advised patient to let us  know if she sees improvement with the Creon and we can send a prescription. Patient has also scheduled a follow up visit with Dr Nandigam for 01/11/24.

## 2023-11-24 ENCOUNTER — Other Ambulatory Visit: Payer: Self-pay | Admitting: Cardiology

## 2023-11-28 ENCOUNTER — Other Ambulatory Visit: Payer: Self-pay

## 2023-11-28 DIAGNOSIS — I25118 Atherosclerotic heart disease of native coronary artery with other forms of angina pectoris: Secondary | ICD-10-CM

## 2023-11-28 MED ORDER — METOPROLOL SUCCINATE ER 25 MG PO TB24: 37.5000 mg | ORAL_TABLET | Freq: Every day | ORAL | 2 refills | Status: AC

## 2023-12-06 ENCOUNTER — Telehealth: Payer: Self-pay

## 2023-12-06 NOTE — Telephone Encounter (Signed)
 The patient was approved for patient assistance and this would expire in December. I do not know why a PA would be needed unless [employer] needs verification that it continues to not be covered or affordable through insurance. In that case, go forward with the prior authorization process for documentation purposes. Insurance will not discuss coverage with the patient assistance programs. If this is not helpful, let me know. Thanks

## 2023-12-06 NOTE — Telephone Encounter (Signed)
 I called [employer] [facility] and confirmed they did not need any verification.  Patient's last shipment was 11/25/23. Renewal will be in December as previously mentioned.

## 2023-12-06 NOTE — Telephone Encounter (Signed)
 Pharmacy Patient Advocate Encounter   Received notification from CoverMyMeds that prior authorization for Skyrizi  360MG /2.4ML (150MG /ML) single-dose prefilled cartridge with on-body injector is required/requested.   Insurance verification completed.   The patient is insured through [facility] Medicare Part D .   Per test claim: Please advise

## 2023-12-06 NOTE — Telephone Encounter (Signed)
 This is a renewal request for the at home injections for patient that was previously approved. I spoke with others who informed me that I will not need to submit any further PA's unless contacted by [employer]. Should they contact the office any time before the December expiration, please let us  know.

## 2023-12-17 ENCOUNTER — Other Ambulatory Visit: Payer: Self-pay | Admitting: Gastroenterology

## 2024-01-11 ENCOUNTER — Other Ambulatory Visit (INDEPENDENT_AMBULATORY_CARE_PROVIDER_SITE_OTHER)

## 2024-01-11 ENCOUNTER — Ambulatory Visit (INDEPENDENT_AMBULATORY_CARE_PROVIDER_SITE_OTHER): Admitting: Gastroenterology

## 2024-01-11 ENCOUNTER — Encounter: Payer: Self-pay | Admitting: Gastroenterology

## 2024-01-11 VITALS — BP 128/58 | HR 72 | Ht 63.0 in | Wt 141.0 lb

## 2024-01-11 DIAGNOSIS — E538 Deficiency of other specified B group vitamins: Secondary | ICD-10-CM

## 2024-01-11 DIAGNOSIS — E559 Vitamin D deficiency, unspecified: Secondary | ICD-10-CM | POA: Diagnosis not present

## 2024-01-11 DIAGNOSIS — K5080 Crohn's disease of both small and large intestine without complications: Secondary | ICD-10-CM

## 2024-01-11 DIAGNOSIS — Z5181 Encounter for therapeutic drug level monitoring: Secondary | ICD-10-CM

## 2024-01-11 DIAGNOSIS — K862 Cyst of pancreas: Secondary | ICD-10-CM

## 2024-01-11 DIAGNOSIS — K5090 Crohn's disease, unspecified, without complications: Secondary | ICD-10-CM | POA: Diagnosis not present

## 2024-01-11 DIAGNOSIS — Z79620 Long term (current) use of immunosuppressive biologic: Secondary | ICD-10-CM | POA: Diagnosis not present

## 2024-01-11 DIAGNOSIS — K9089 Other intestinal malabsorption: Secondary | ICD-10-CM

## 2024-01-11 LAB — COMPREHENSIVE METABOLIC PANEL WITH GFR
ALT: 73 U/L — ABNORMAL HIGH (ref 0–35)
AST: 77 U/L — ABNORMAL HIGH (ref 0–37)
Albumin: 4.1 g/dL (ref 3.5–5.2)
Alkaline Phosphatase: 112 U/L (ref 39–117)
BUN: 15 mg/dL (ref 6–23)
CO2: 24 meq/L (ref 19–32)
Calcium: 9 mg/dL (ref 8.4–10.5)
Chloride: 102 meq/L (ref 96–112)
Creatinine, Ser: 0.53 mg/dL (ref 0.40–1.20)
GFR: 96.94 mL/min (ref >60.00)
Glucose, Bld: 287 mg/dL — ABNORMAL HIGH (ref 70–99)
Potassium: 4.2 meq/L (ref 3.5–5.1)
Sodium: 136 meq/L (ref 135–145)
Total Bilirubin: 0.3 mg/dL (ref 0.2–1.2)
Total Protein: 6.5 g/dL (ref 6.0–8.3)

## 2024-01-11 LAB — CBC WITH DIFFERENTIAL/PLATELET
Basophils Absolute: 0.1 K/uL (ref 0.0–0.1)
Basophils Relative: 0.7 % (ref 0.0–3.0)
Eosinophils Absolute: 0.2 K/uL (ref 0.0–0.7)
Eosinophils Relative: 3.2 % (ref 0.0–5.0)
HCT: 41.6 % (ref 36.0–46.0)
Hemoglobin: 13.6 g/dL (ref 12.0–15.0)
Lymphocytes Relative: 31.6 % (ref 12.0–46.0)
Lymphs Abs: 2.3 K/uL (ref 0.7–4.0)
MCHC: 32.6 g/dL (ref 30.0–36.0)
MCV: 88.6 fl (ref 78.0–100.0)
Monocytes Absolute: 0.7 K/uL (ref 0.1–1.0)
Monocytes Relative: 9.6 % (ref 3.0–12.0)
Neutro Abs: 4 K/uL (ref 1.4–7.7)
Neutrophils Relative %: 54.9 % (ref 43.0–77.0)
Platelets: 278 K/uL (ref 150.0–400.0)
RBC: 4.69 Mil/uL (ref 3.87–5.11)
RDW: 14.5 % (ref 11.5–15.5)
WBC: 7.4 K/uL (ref 4.0–10.5)

## 2024-01-11 LAB — VITAMIN D 25 HYDROXY (VIT D DEFICIENCY, FRACTURES): VITD: 31.1 ng/mL (ref 30.00–100.00)

## 2024-01-11 LAB — B12 AND FOLATE PANEL
Folate: 21.7 ng/mL (ref >5.9)
Vitamin B-12: 359 pg/mL (ref 211–911)

## 2024-01-11 LAB — IBC + FERRITIN
Ferritin: 64.5 ng/mL (ref 10.0–291.0)
Iron: 61 ug/dL (ref 42–145)
Saturation Ratios: 13.4 % — ABNORMAL LOW (ref 20.0–50.0)
TIBC: 455 ug/dL — ABNORMAL HIGH (ref 250.0–450.0)
Transferrin: 325 mg/dL (ref 212.0–360.0)

## 2024-01-11 NOTE — Progress Notes (Signed)
 [name]    [id number]    January 03, 1961  Primary Care Physician:Crawford, Almarie LABOR, MD  Referring Physician: Rollene Almarie LABOR, MD [address] [place],  [place] [address]   Chief complaint:  Crohn's disease, chronic diarrhea  Discussed the use of AI scribe software for clinical note transcription with the patient, who gave verbal consent to proceed.  History of Present Illness [name] is a 63 year old female with Crohn's disease who presents for follow-up of her gastrointestinal symptoms.  Gastrointestinal symptoms - Crohn's disease with improvement in diarrhea since last visit - Recent Clostridioides difficile infection earlier this summer, which exacerbated Crohn's disease symptoms - Currently on Skyrizi ; three infusions completed, due for second at-home dose (administered every eight weeks) - Also taking Colestid  once daily at bedtime - Bowel movements once or twice daily, usually in the morning - Stools are formed but soft, without blood - Symptoms are improving but not yet at previous baseline  Pancreatic health concerns - Family history of pancreatic cancer; 66 year old brother recently diagnosed with stage two pancreatic cancer - Pancreatic cyst identified on CT scan in January 2025; follow-up CT in May 2025 showed no abnormalities - Concern about pancreatic health due to family history  Psychological stress - Experiencing significant stress related to caregiving responsibilities for adopted 64-month-old granddaughter - Currently taking medication for stress - Feels overwhelmed by caregiving duties - Concern about son's and daughter-in-law's ability to care for their children due to history of drug use  Musculoskeletal pain and mobility limitation - History of degenerative disc disease causing back pain and limiting mobility  Injection site discomfort - Discomfort and bruising at insulin  injection sites - Considering use of upper thighs  for injections to alleviate site issues   Relevant Hx: Colonoscopy September 02, 2023 - Simple Endoscopic Score for Crohn' s Disease: 1, mucosal inflammatory changes secondary to Crohn' s disease, in remission. Biopsied. - Ileal diverticula. - Pseudopolyps in the terminal ileum. Biopsied.  1. Surgical [P], small bowel, terminal ileum :       SMALL INTESTINAL MUCOSA WITHOUT SIGNIFICANT DIAGNOSTIC ALTERATION.       NEGATIVE FOR ACTIVITY, CHRONICITY, GRANULOMA, DYSPLASIA OR MALIGNANCY.        2. Surgical [P], random colon :       COLONIC MUCOSA WITH FOCAL MILD CRYPT ARCHITECTURE DISARRAY AND HYPERPLASTIC       CHANGES.       NEGATIVE FOR ACTIVITY, CHRONICITY, GRANULOMA, DYSPLASIA OR MALIGNANCY.   LYMPH NODE, BIOPSY:  Benign reactive lymph node with focal epithelioid histiocytic  aggregates.  No fungal elements identified on GMS stain.  No acid fast microorganisms identified on AFB stain.  Negative for lymphoproliferative disorder or malignancy.     CT abd & pelvis 06/08/2023 1. Prominent/mildly enlarged left iliac side chain, pelvic sidewall and inguinal lymph nodes, nonspecific, possibly reactive versus lymphoproliferative process. Consider correlation with laboratory values follow-up CT abdomen pelvis in 3 months to assess for stability or resolution. A more aggressive approach would include ultrasound-guided fine-needle aspiration of the dominant left inguinal lymph node. 2. Diffuse hepatic steatosis. 3. Scattered colonic diverticulosis without findings of acute diverticulitis. 4.  Aortic Atherosclerosis (ICD10-I70.0).   Colonoscopy 09/23/21: - One 5 mm polyp in the ascending colon, removed with a cold snare. Resected and retrieved. - Simple Endoscopic Score for Crohn's Disease: 4, mucosal inflammatory changes secondary to Crohn's disease with ileitis. Biopsied. - Diverticulosis in the sigmoid colon. -  Non-bleeding external and internal hemorrhoids. 2. Surgical [P], colon, ascending,  polyp (1) TUBULAR ADENOMA NEGATIVE FOR HIGH-GRADE DYSPLASIA AND CARCINOMA   Endoscopy 09/23/21: - The Z-line was regular and was found 35 cm from the incisors. - A small hiatal hernia was present. - The stomach was normal. - The cardia and gastric fundus were normal on retroflexion. - The examined duodenum was normal. Pathology 09/23/21: 1. Surgical [P], small bowel, terminal ileum MINIMAL ACUTE ILEITIS (SEE MICROSCOPIC COMMENT) CRP elevated to 55,000 in May 2022 and was persistently elevated on Entyvio  and budesonide    Colonoscopy December 05, 2018: Terminal ileal biopsies consistent with Crohn's disease.  IC valve polypoid lesion status post EMR, biopsies consistent with IBD negative for dysplasia or adenomatous tissue 5 mm polyp, tubular adenoma removed from transverse colon otherwise normal colon on chromoendoscopy   Colonoscopy July 07, 2018 Nodular TI biopsied Localized nodular polypoid mucosa in IC valve biopsied, ?  Adenoma on pathology report, requested additional review is pending.   EGD 08/22/2009: Esophagitis otherwise unremarkable exam      MRI pelvis February 14, 2018 showed grade 1 simple linear intersphincteric perianal fistula without associated abscess.  Mild segmental wall thickening and luminal narrowing in the distal ileum suggestive of mild active Crohn's ileitis.  Mild chronic wall thickening in the terminal ileum without active inflammatory changes.     Crohn's disease with predominant small bowel involvement diagnosed in 1991.  Initially was managed with prednisone , had significant side effects.  Subsequently was in clinical remission on 6-MP.  6-MP was discontinued in 2017 due to persistently elevated transaminases, have improved after discontinuing 6-MP.  She was started on Humira , clinically failed with persistent symptoms.  Had undetectable drug trough with elevated antibodies.  Started on budesonide  9 mg daily and taper down to 3 mg daily in 2018.  Developed rectal  pain and noted to have perianal fistula on MRI pelvis September 2019.   Relevant other PMH She had NSTEMI in November 2018, status post coronary stent placement (DES)  Outpatient Encounter Medications as of 01/11/2024  Medication Sig   acetaminophen  (TYLENOL ) 650 MG CR tablet Take 650 mg by mouth every 8 (eight) hours as needed for pain.   albuterol  (PROVENTIL ) (2.5 MG/3ML) 0.083% nebulizer solution Take 2.5 mg by nebulization every 4 (four) hours as needed for wheezing or shortness of breath.    albuterol  (VENTOLIN  HFA) 108 (90 Base) MCG/ACT inhaler Inhale 1-2 puffs into the lungs every 6 (six) hours as needed for wheezing or shortness of breath.   amoxicillin-clavulanate (AUGMENTIN) 875-125 MG tablet Take 1 tablet by mouth 2 (two) times daily.   aspirin  EC 81 MG tablet Take 81 mg by mouth daily. Swallow whole.   Blood Glucose Monitoring Suppl (FREESTYLE LITE) w/Device KIT Use to check blood sugar up to 4 times a day as directed   budesonide  (ENTOCORT EC ) 3 MG 24 hr capsule Take 3 capsules (9 mg total) by mouth daily.   colestipol  (COLESTID ) 1 g tablet TAKE 1 TABLET (1 G TOTAL) BY MOUTH 3 (THREE) TIMES DAILY WITH MEALS. DO NOT TAKE WITHIN 2 HOURS OF ANY OTHER MEDICATIONS   Continuous Blood Gluc Sensor (FREESTYLE LIBRE 14 DAY SENSOR) MISC Use to monitor sugars   Continuous Blood Gluc Sensor (FREESTYLE LIBRE SENSOR SYSTEM) MISC Use for blood sugar monitoring   cyanocobalamin  (VITAMIN B12) 1000 MCG/ML injection Inject 1 mL (1,000 mcg total) into the muscle every 30 (thirty) days.   dicyclomine  (BENTYL ) 20 MG tablet Take 1 tablet (20 mg  total) by mouth every 6 (six) hours.   DULoxetine  (CYMBALTA ) 60 MG capsule Take 1 capsule (60 mg total) by mouth daily.   enalapril  (VASOTEC ) 10 MG tablet Take 1 tablet (10 mg) by mouth daily.   ergocalciferol  (VITAMIN D2) 1.25 MG (50000 UT) capsule Take 1 capsule (50,000 Units total) by mouth once a week.   Evolocumab  (REPATHA  SURECLICK) 140 MG/ML SOAJ Inject 140  mg into the skin every 14 (fourteen) days.   fenofibrate  (TRICOR ) 145 MG tablet TAKE 1 TABLET BY MOUTH EVERY DAY   furosemide  (LASIX ) 40 MG tablet Take 1 tablet (40 mg total) by mouth daily as needed for fluid or edema.   glimepiride  (AMARYL ) 4 MG tablet Take 1 tablet (4 mg total) by mouth daily before breakfast.   glucose blood (ONETOUCH ULTRA) test strip USE AS DIRECTED   insulin  glargine (LANTUS  SOLOSTAR) 100 UNIT/ML Solostar Pen INJECT 58 UNITS SUBCUTANEOUSLY EVERY DAY   Insulin  Pen Needle (PEN NEEDLES 3/16) 31G X 5 MM MISC Use daily   Lancets (FREESTYLE) lancets Use to check blood sugar up to 4 times a day as directed   levocetirizine (XYZAL ) 5 MG tablet Take 1 tablet  by mouth every evening.   loperamide  (IMODIUM ) 2 MG capsule Take 1 capsule (2 mg total) by mouth 4 (four) times daily as needed for diarrhea or loose stools.   metFORMIN  (GLUCOPHAGE -XR) 500 MG 24 hr tablet Take 2 tablets by mouth daily.   metoprolol  succinate (TOPROL -XL) 25 MG 24 hr tablet Take 1.5 tablets (37.5 mg total) by mouth daily.   montelukast  (SINGULAIR ) 10 MG tablet Take 1 tablet (10 mg total) by mouth every evening.   Multiple Vitamins-Minerals (MULTIVITAMIN WITH MINERALS) tablet Take 1 tablet by mouth daily.   nitroGLYCERIN  (NITROSTAT ) 0.4 MG SL tablet Place 1 tablet (0.4 mg total) under the tongue every 5 (five) minutes as needed for chest pain.  Call 911 if no relief after 1st dose.   ondansetron  (ZOFRAN  ODT) 4 MG disintegrating tablet Take 1 tablet (4 mg total) by mouth every 4 (four) hours as needed for nausea or vomiting.   pantoprazole  (PROTONIX ) 40 MG tablet Take 1 tablet (40 mg total) by mouth daily.   Probiotic Product (PROBIOTIC DAILY PO) Take 1 capsule by mouth daily.   SKYRIZI  360 MG/2.4ML SOCT 360 mg SQ injection by cartridge and repeat every 8 weeks   sodium chloride  (OCEAN) 0.65 % SOLN nasal spray Place 1 spray into both nostrils daily as needed for congestion.   sucralfate  (CARAFATE ) 1 g tablet  Take 1 tablet (1 g total) by mouth 4 (four) times daily -  with meals and at bedtime.   VASCEPA  1 g capsule Take 2 capsules by mouth 2 times daily.   Vitamin D3 (VITAMIN D ) 25 MCG tablet Take 1,000 Units by mouth at bedtime.   [DISCONTINUED] EPIPEN  2-PAK 0.3 MG/0.3ML SOAJ injection Inject 0.3 mg into the muscle as needed for anaphylaxis. Reported on 06/27/2015   No facility-administered encounter medications on file as of 01/11/2024.    Allergies as of 01/11/2024 - Review Complete 01/11/2024  Allergen Reaction Noted   Lipitor [atorvastatin ] Other (See Comments) 03/28/2013   Remicade [infliximab]     Rosuvastatin  Other (See Comments) 01/25/2019   Ticagrelor   09/16/2017    Past Medical History:  Diagnosis Date   Allergic rhinitis due to pollen    Allergy     Anxiety state, unspecified    Arthritis    Bronchitis    hx   CAD (coronary  artery disease)    s/p NSTEMI in 11/18 treated with a DES to the proximal LAD   Calculus of gallbladder without mention of cholecystitis or obstruction    Crohn disease (HCC)    Depressive disorder, not elsewhere classified    Diabetes mellitus without complication (HCC)    diet controlled   Esophageal reflux    Fibromyalgia    Grade I diastolic dysfunction    Noted on ECHO   Headache    History of nuclear stress test    ETT-Myoview  10/17: EF 55%, normal perfusion; Low Risk // Nuclear stress test 05/2018:  EF 63, normal perfusion; Low Risk   Myalgia and myositis, unspecified    NSTEMI (non-ST elevated myocardial infarction) (HCC) 04/15/2017   Other and unspecified hyperlipidemia    Palpitations    Pneumonia 13   hx   S/P angioplasty with stent 04/18/17 with DES to pLAD 04/19/2017   Seizures (HCC)    1 seizure as a teenager    Unspecified essential hypertension    Vitamin B deficiency     Past Surgical History:  Procedure Laterality Date   BIOPSY  12/05/2018   Procedure: BIOPSY;  Surgeon: Shila Gustav GAILS, MD;  Location: [facility];   Service: Endoscopy;;   CHOLECYSTECTOMY N/A 09/19/2014   Procedure: LAPAROSCOPIC CHOLECYSTECTOMY WITH INTRAOPERATIVE CHOLANGIOGRAM;  Surgeon: Donnice Lima, MD;  Location: [facility] OR;  Service: General;  Laterality: N/A;   COLONOSCOPY WITH PROPOFOL  N/A 12/05/2018   Procedure: COLONOSCOPY WITH PROPOFOL ;  Surgeon: Shila Gustav GAILS, MD;  Location: [facility];  Service: Endoscopy;  Laterality: N/A;  chromoendoscopy   CORONARY PRESSURE/FFR STUDY N/A 04/18/2017   Procedure: INTRAVASCULAR PRESSURE WIRE/FFR STUDY;  Surgeon: Swaziland, Peter M, MD;  Location: [facility] INVASIVE CV LAB;  Service: Cardiovascular;  Laterality: N/A;   CORONARY STENT INTERVENTION N/A 04/18/2017   Procedure: CORONARY STENT INTERVENTION;  Surgeon: Swaziland, Peter M, MD;  Location: [facility] INVASIVE CV LAB;  Service: Cardiovascular;  Laterality: N/A;   ESOPHAGOGASTRODUODENOSCOPY     EXPLORATORY LAPAROTOMY     for endometriosis   LEFT HEART CATH AND CORONARY ANGIOGRAPHY N/A 04/18/2017   Procedure: LEFT HEART CATH AND CORONARY ANGIOGRAPHY;  Surgeon: Swaziland, Peter M, MD;  Location: [facility] INVAS[facility] CV LAB;  Service: Cardiovascular;  Laterality: N/A;   POLYPECTOMY  12/05/2018   Procedure: POLYPECTOMY;  Surgeon: Shila Gustav GAILS, MD;  Location: [facility];  Service: Endoscopy;;   SUBMUCOSAL LIFTING INJECTION  12/05/2018   Procedure: SUBMUCOSAL LIFTING INJECTION;  Surgeon: Shila Gustav GAILS, MD;  Location: [facility];  Service: Endoscopy;;   TONSILLECTOMY AND ADENOIDECTOMY     TUBAL LIGATION     BILATERAL    Family History  Problem Relation Age of Onset   Lung cancer Maternal Grandmother    Stroke Father    Hypertension Mother    Heart attack Mother    Heart disease Mother    Hypertension Brother    Heart disease Brother    Hypertension Sister    Heart disease Sister    Coronary artery disease Brother    Colon cancer Neg Hx    Stomach cancer Neg Hx    Esophageal cancer Neg Hx    Rectal cancer Neg Hx     Social History   Socioeconomic  History   Marital status: Married    Spouse name: Dane   Number of children: 2   Years of education: 12+   Highest education level: Not on file  Occupational History   Occupation: ACCOUNT REP.  Employer: [employer]    Comment: retired  Tobacco Use   Smoking status: Former    Current packs/day: 0.00    Average packs/day: 1 pack/day for 35.0 years (35.0 ttl pk-yrs)    Types: Cigarettes    Start date: 05/31/1969    Quit date: 05/31/2004    Years since quitting: 19.6   Smokeless tobacco: Never  Vaping Use   Vaping status: Never Used  Substance and Sexual Activity   Alcohol use: No   Drug use: No   Sexual activity: Not Currently    Comment: married  Other Topics Concern   Not on file  Social History Narrative   Lives with her husband and their pets. Her children are adults and live independently.   Social Drivers of Corporate investment banker Strain: Not on file  Food Insecurity: No Food Insecurity (04/06/2022)   Hunger Vital Sign    Worried About Running Out of Food in the Last Year: Never true    Ran Out of Food in the Last Year: Never true  Transportation Needs: No Transportation Needs (04/06/2022)   PRAPARE - Administrator, Civil Service (Medical): No    Lack of Transportation (Non-Medical): No  Physical Activity: Not on file  Stress: Not on file  Social Connections: Not on file  Intimate Partner Violence: Not on file      Review of systems: All other review of systems negative except as mentioned in the HPI.   Physical Exam: Vitals:   01/11/24 1040  BP: (!) 128/58  Pulse: 72   Body mass index is 24.98 kg/m. Gen:      No acute distress HEENT:  sclera anicteric Abd:      soft, non-tender; no palpable masses, no distension Ext:    No edema Neuro: alert and oriented x 3 Psych: normal mood and affect  Data Reviewed:  Reviewed labs, radiology imaging, old records and pertinent past GI work up     Assessment and  Plan Assessment & Plan Crohn's disease Crohn's disease is improving with Skyrizi  treatment. She has received three infusions and is on her second at-home dose. Skyrizi  is controlling symptoms, though returning to baseline is gradual. Colestid  at bedtime aids bowel habits. Stools are soft, formed, and blood-free. Stress contributes to symptom exacerbation. - Continue Skyrizi  as prescribed - Continue Colestid  at bedtime - Order CBC, CMP, and fecal calprotectin to assess inflammation and Skyrizi  efficacy - TB test  - Check B12 and folate levels - Discuss potential insurance switch to ensure Skyrizi  coverage  Pancreatic cyst A pancreatic cyst identified on a CT scan in January 2025 showed no abnormalities on follow-up CT in May. Given the family history of pancreatic cancer, a dedicated pancreas CT is planned for January 2026. The cysts are considered benign with a very low risk of malignancy. - Order dedicated pancreas CT in January 2026 - Set reminder for potential earlier CT in December if deductible is met  Vitamin D  deficiency Vitamin D  deficiency is present, and Vitamin D3 supplementation is advised as it is readily available and effectively converts in the body. - Recommend Vitamin D3 supplementation  Vitamin B12 deficiency Vitamin B12 deficiency is associated with Crohn's disease. Levels will be checked to ensure they are within normal range. - Check B12 levels     This visit required >40 minutes of patient care (this includes precharting, chart review, review of results, face-to-face time used for counseling as well as treatment plan and follow-up.  The patient was provided an opportunity to ask questions and all were answered. The patient agreed with the plan and demonstrated an understanding of the instructions.  LOIS Wilkie Mcgee , MD    CC: Rollene Almarie LABOR, *

## 2024-01-11 NOTE — Patient Instructions (Addendum)
 VISIT SUMMARY:  You had a follow-up visit to discuss your gastrointestinal symptoms related to Crohn's disease, concerns about pancreatic health, stress, and other health issues. Your Crohn's disease is improving with current treatment, and we have a plan to monitor and manage your other health concerns.  YOUR PLAN:  CROHN'S DISEASE: Your Crohn's disease symptoms are improving with Skyrizi  and Colestid , though you haven't yet returned to your previous baseline. -Continue taking Skyrizi  as prescribed. -Continue taking Colestid  at bedtime. -We will order blood tests (CBC, CMP) and a fecal calprotectin test to assess inflammation and the effectiveness of Skyrizi . -A TB test will be performed in October. -We will check your B12 and folate levels. -We will discuss a potential insurance switch to ensure Skyrizi  coverage.  PANCREATIC CYST: A pancreatic cyst was identified earlier this year, but follow-up scans showed no abnormalities. Given your family history of pancreatic cancer, we will continue to monitor this closely. -A dedicated pancreas CT is planned for January 2026. -We will set a reminder for a potential earlier CT in December if your deductible is met.  VITAMIN D  DEFICIENCY: You have a Vitamin D  deficiency. -Start taking Vitamin D3 supplements.  VITAMIN B12 DEFICIENCY: You may have a Vitamin B12 deficiency related to Crohn's disease. -We will check your B12 levels.  Due to recent changes in healthcare laws, you may see the results of your imaging and laboratory studies on MyChart before your provider has had a chance to review them.  We understand that in some cases there may be results that are confusing or concerning to you. Not all laboratory results come back in the same time frame and the provider may be waiting for multiple results in order to interpret others.  Please give us  48 hours in order for your provider to thoroughly review all the results before contacting the office for  clarification of your results.    I appreciate the  opportunity to care for you  Thank You   Kavitha Nandigam , MD

## 2024-01-13 ENCOUNTER — Ambulatory Visit: Payer: Self-pay | Admitting: Gastroenterology

## 2024-01-15 LAB — QUANTIFERON-TB GOLD PLUS
Mitogen-NIL: 8.69 [IU]/mL
NIL: 0.02 [IU]/mL
QuantiFERON-TB Gold Plus: NEGATIVE
TB1-NIL: 0 [IU]/mL
TB2-NIL: 0 [IU]/mL

## 2024-01-16 ENCOUNTER — Other Ambulatory Visit: Payer: Self-pay

## 2024-01-16 DIAGNOSIS — E538 Deficiency of other specified B group vitamins: Secondary | ICD-10-CM

## 2024-01-16 MED ORDER — CYANOCOBALAMIN 1000 MCG/ML IJ SOLN
1000.0000 ug | INTRAMUSCULAR | Status: AC
  Administered 2024-01-25 – 2024-05-21 (×4): 1000 ug via SUBCUTANEOUS

## 2024-01-16 MED ORDER — CYANOCOBALAMIN 1000 MCG/ML IJ SOLN: INTRAMUSCULAR | 3 refills | Status: DC

## 2024-01-18 ENCOUNTER — Telehealth: Payer: Self-pay | Admitting: Cardiology

## 2024-01-18 MED ORDER — VASCEPA 1 G PO CAPS: 2.0000 g | ORAL_CAPSULE | Freq: Two times a day (BID) | ORAL | 2 refills | Status: AC

## 2024-01-18 NOTE — Telephone Encounter (Signed)
 RX sent in

## 2024-01-18 NOTE — Telephone Encounter (Signed)
*  STAT* If patient is at the pharmacy, call can be transferred to refill team.   1. Which medications need to be refilled? (please list name of each medication and dose if known)   VASCEPA  1 g capsule     2. Would you like to learn more about the convenience, safety, & potential cost savings by using the [facility]? NO   3. Are you open to using the [facility] NO   4. Which pharmacy/location (including street and city if local pharmacy) is medication to be sent to? [id number] - [place], [place] - [address]    5. Do they need a 30 day or 90 day supply? 90  Patient states that she gets the generic brand of Vascepa

## 2024-01-25 ENCOUNTER — Ambulatory Visit: Attending: Cardiovascular Disease | Admitting: Pharmacist

## 2024-01-25 ENCOUNTER — Other Ambulatory Visit

## 2024-01-25 ENCOUNTER — Ambulatory Visit (INDEPENDENT_AMBULATORY_CARE_PROVIDER_SITE_OTHER)

## 2024-01-25 DIAGNOSIS — K9089 Other intestinal malabsorption: Secondary | ICD-10-CM

## 2024-01-25 DIAGNOSIS — E559 Vitamin D deficiency, unspecified: Secondary | ICD-10-CM

## 2024-01-25 DIAGNOSIS — E785 Hyperlipidemia, unspecified: Secondary | ICD-10-CM | POA: Diagnosis not present

## 2024-01-25 DIAGNOSIS — E1169 Type 2 diabetes mellitus with other specified complication: Secondary | ICD-10-CM | POA: Diagnosis not present

## 2024-01-25 DIAGNOSIS — K862 Cyst of pancreas: Secondary | ICD-10-CM

## 2024-01-25 DIAGNOSIS — K5080 Crohn's disease of both small and large intestine without complications: Secondary | ICD-10-CM

## 2024-01-25 DIAGNOSIS — E118 Type 2 diabetes mellitus with unspecified complications: Secondary | ICD-10-CM

## 2024-01-25 DIAGNOSIS — E538 Deficiency of other specified B group vitamins: Secondary | ICD-10-CM

## 2024-01-25 NOTE — Progress Notes (Unsigned)
 Patient ID: [name]                 DOB: 14-Jun-1960                    MRN: [id number]      HPI: [name] is a 63 y.o. female patient referred to lipid clinic by Dr.Tolia. PMH is significant for hypertension, OSA, CADs/p DES 03/2017, Aortic atherosclerosis, fatty infiltration of liver, DM, HLD, statin myopathy, family hx of premature CAD, MDD.   The patient presented today for follow-up in the lipid clinic. She reported that she had been off Vascepa  for a few weeks prior to her last lipid panel, as she ran out and forgot to refill the prescription. She also recalled that she did not fast before the last lipid panel due to a lack of awareness. Additionally, she has been taking colestipol  1 gram twice daily instead of the prescribed 1 gram three times daily. She expressed interest in obtaining updated labs before any medication changes are made. Her A1c has improved; however, she is currently requiring over 50 units of basal insulin  daily, in addition to only 100 mg of metformin  and 4 mg of glimepiride  taken daily with breakfast. The patient acknowledged that her dietary habits have not been optimal, as she recently adopted her 63-month-old granddaughter and has had many new responsibilities and stressors. Despite this, she remains physically active while caring for her granddaughter and is open to implementing a more structured exercise routine.  Current Medications: Vascepa  2 gm twice daily, fenofibrate  145 mg daily, Colestipol  1 gm three times daily, Repatha  Q14D  Intolerances: statins- myalgia  Risk Factors: hypertension, OSA, CAD, Aortic atherosclerosis, fatty infiltration of liver, DM, HLD, statin myopathy, family hx of premature CAD LDL goal: <55, TG <150  Last lipid lab TC 105, TG 259 LDLc 27, HDL 39 (08/2023)  Diet: not healthy lately   Exercise: none stays active with grand daughter who is 18 months old   Family History:  Relation Problem Comments  Mother (Deceased at age 6)  Heart attack   Heart disease   Hypertension     Father (Deceased at age 75) Stroke     Sister (Deceased) Heart disease   Hypertension     Brother Metallurgist) Heart disease   Hypertension, pancreatic cancer      Brother Coronary artery disease     Maternal Grandmother (Deceased) Lung cancer     Social History:  Alcohol: none  Smoking: never   Labs:  Lipid Panel     Component Value Date/Time   CHOL 105 09/16/2023 1108   TRIG 259 (H) 09/16/2023 1108   HDL 39 (L) 09/16/2023 1108   CHOLHDL 2.7 09/16/2023 1108   CHOLHDL 5 08/14/2021 0921   VLDL 53.6 (H) 08/15/2014 0734   LDLCALC 27 09/16/2023 1108   LDLDIRECT 29 09/16/2023 1108   LDLDIRECT 102.0 08/14/2021 0921   LABVLDL 39 09/16/2023 1108    Past Medical History:  Diagnosis Date   Allergic rhinitis due to pollen    Allergy     Anxiety state, unspecified    Arthritis    Bronchitis    hx   CAD (coronary artery disease)    s/p NSTEMI in 11/18 treated with a DES to the proximal LAD   Calculus of gallbladder without mention of cholecystitis or obstruction    Crohn disease (HCC)    Depressive disorder, not elsewhere classified    Diabetes mellitus without complication (HCC)  diet controlled   Esophageal reflux    Fibromyalgia    Grade I diastolic dysfunction    Noted on ECHO   Headache    History of nuclear stress test    ETT-Myoview  10/17: EF 55%, normal perfusion; Low Risk // Nuclear stress test 05/2018:  EF 63, normal perfusion; Low Risk   Myalgia and myositis, unspecified    NSTEMI (non-ST elevated myocardial infarction) (HCC) 04/15/2017   Other and unspecified hyperlipidemia    Palpitations    Pneumonia 15   hx   S/P angioplasty with stent 04/18/17 with DES to pLAD 04/19/2017   Seizures (HCC)    1 seizure as a teenager    Unspecified essential hypertension    Vitamin B deficiency     Current Outpatient Medications on File Prior to Visit  Medication Sig Dispense Refill   acetaminophen  (TYLENOL ) 650 MG  CR tablet Take 650 mg by mouth every 8 (eight) hours as needed for pain.     albuterol  (PROVENTIL ) (2.5 MG/3ML) 0.083% nebulizer solution Take 2.5 mg by nebulization every 4 (four) hours as needed for wheezing or shortness of breath.      albuterol  (VENTOLIN  HFA) 108 (90 Base) MCG/ACT inhaler Inhale 1-2 puffs into the lungs every 6 (six) hours as needed for wheezing or shortness of breath.     amoxicillin-clavulanate (AUGMENTIN) 875-125 MG tablet Take 1 tablet by mouth 2 (two) times daily.     aspirin  EC 81 MG tablet Take 81 mg by mouth daily. Swallow whole.     Blood Glucose Monitoring Suppl (FREESTYLE LITE) w/Device KIT Use to check blood sugar up to 4 times a day as directed 1 kit 0   budesonide  (ENTOCORT EC ) 3 MG 24 hr capsule Take 3 capsules (9 mg total) by mouth daily. 270 capsule 3   colestipol  (COLESTID ) 1 g tablet TAKE 1 TABLET (1 G TOTAL) BY MOUTH 3 (THREE) TIMES DAILY WITH MEALS. DO NOT TAKE WITHIN 2 HOURS OF ANY OTHER MEDICATIONS 270 tablet 1   Continuous Blood Gluc Sensor (FREESTYLE LIBRE 14 DAY SENSOR) MISC Use to monitor sugars 2 each 11   Continuous Blood Gluc Sensor (FREESTYLE LIBRE SENSOR SYSTEM) MISC Use for blood sugar monitoring 1 each 0   cyanocobalamin  (VITAMIN B12) 1000 MCG/ML injection Inject SQ into skin every 14 days x 3 months then monthly 2 mL 3   dicyclomine  (BENTYL ) 20 MG tablet Take 1 tablet (20 mg total) by mouth every 6 (six) hours. 60 tablet 0   DULoxetine  (CYMBALTA ) 60 MG capsule Take 1 capsule (60 mg total) by mouth daily. 90 capsule 3   enalapril  (VASOTEC ) 10 MG tablet Take 1 tablet (10 mg) by mouth daily. 90 tablet 3   ergocalciferol  (VITAMIN D2) 1.25 MG (50000 UT) capsule Take 1 capsule (50,000 Units total) by mouth once a week. 12 capsule 0   Evolocumab  (REPATHA  SURECLICK) 140 MG/ML SOAJ Inject 140 mg into the skin every 14 (fourteen) days. 6 mL 3   fenofibrate  (TRICOR ) 145 MG tablet TAKE 1 TABLET BY MOUTH EVERY DAY 90 tablet 1   furosemide  (LASIX ) 40 MG  tablet Take 1 tablet (40 mg total) by mouth daily as needed for fluid or edema. 90 tablet 1   glimepiride  (AMARYL ) 4 MG tablet Take 1 tablet (4 mg total) by mouth daily before breakfast. 90 tablet 3   glucose blood (ONETOUCH ULTRA) test strip USE AS DIRECTED 100 strip 0   insulin  glargine (LANTUS  SOLOSTAR) 100 UNIT/ML Solostar Pen INJECT 58 UNITS  SUBCUTANEOUSLY EVERY DAY 15 mL 11   Insulin  Pen Needle (PEN NEEDLES 3/16) 31G X 5 MM MISC Use daily 50 each 11   Lancets (FREESTYLE) lancets Use to check blood sugar up to 4 times a day as directed 100 each 0   levocetirizine (XYZAL ) 5 MG tablet Take 1 tablet  by mouth every evening. 90 tablet 3   loperamide  (IMODIUM ) 2 MG capsule Take 1 capsule (2 mg total) by mouth 4 (four) times daily as needed for diarrhea or loose stools. 12 capsule 0   metFORMIN  (GLUCOPHAGE -XR) 500 MG 24 hr tablet Take 2 tablets by mouth daily. 180 tablet 3   metoprolol  succinate (TOPROL -XL) 25 MG 24 hr tablet Take 1.5 tablets (37.5 mg total) by mouth daily. 135 tablet 2   montelukast  (SINGULAIR ) 10 MG tablet Take 1 tablet (10 mg total) by mouth every evening. 90 tablet 3   Multiple Vitamins-Minerals (MULTIVITAMIN WITH MINERALS) tablet Take 1 tablet by mouth daily.     nitroGLYCERIN  (NITROSTAT ) 0.4 MG SL tablet Place 1 tablet (0.4 mg total) under the tongue every 5 (five) minutes as needed for chest pain.  Call 911 if no relief after 1st dose. 30 tablet 0   ondansetron  (ZOFRAN  ODT) 4 MG disintegrating tablet Take 1 tablet (4 mg total) by mouth every 4 (four) hours as needed for nausea or vomiting. 20 tablet 0   pantoprazole  (PROTONIX ) 40 MG tablet Take 1 tablet (40 mg total) by mouth daily. 90 tablet 3   Probiotic Product (PROBIOTIC DAILY PO) Take 1 capsule by mouth daily.     SKYRIZI  360 MG/2.4ML SOCT 360 mg SQ injection by cartridge and repeat every 8 weeks 2.4 mL 7   sodium chloride  (OCEAN) 0.65 % SOLN nasal spray Place 1 spray into both nostrils daily as needed for congestion.      sucralfate  (CARAFATE ) 1 g tablet Take 1 tablet (1 g total) by mouth 4 (four) times daily -  with meals and at bedtime. 120 tablet 0   VASCEPA  1 g capsule Take 2 capsules by mouth 2 times daily. 360 capsule 2   Vitamin D3 (VITAMIN D ) 25 MCG tablet Take 1,000 Units by mouth at bedtime.     [DISCONTINUED] EPIPEN  2-PAK 0.3 MG/0.3ML SOAJ injection Inject 0.3 mg into the muscle as needed for anaphylaxis. Reported on 06/27/2015  0   Current Facility-Administered Medications on File Prior to Visit  Medication Dose Route Frequency Provider Last Rate Last Admin   cyanocobalamin  (VITAMIN B12) injection 1,000 mcg  1,000 mcg Subcutaneous Q14 Days Nandigam, Kavitha V, MD   1,000 mcg at 01/25/24 1618    Allergies  Allergen Reactions   Lipitor [Atorvastatin ] Other (See Comments)    Elevated ALT   Remicade [Infliximab]     REACTION: SOB   Rosuvastatin  Other (See Comments)    Pt reports causes muscle aches and leg pains and feet to swell   Ticagrelor      Shortness of breath    Assessment/Plan:  1. Hyperlipidemia -  Problem  Hyperlipidemia Associated With Type 2 Diabetes Mellitus (Hcc)   Qualifier: Diagnosis of  By: Norleen MD, Lynwood ORN     Hyperlipidemia associated with type 2 diabetes mellitus (HCC) Assessment and plan:  Last lipid panel (08/2023): TC 105, TG 259, LDL-C 27, HDL 39 LDL is at goal (<55), but triglycerides remain elevated (goal <150) Patient was off Vascepa  prior to last labs and did not fast; also taking colestipol  less frequently than prescribed Documented history of statin-associated myopathy  Continue current non-statin regimen including Repatha , Vascepa , fenofibrate , and colestipol   Will get updated lipid lab and A1c - will optimize lipid therapy by addition of small dose Crestor  to lower TG to goal and optimize BG level by replacing Basal insulin  to GLP1 agent  Reinforce adherence, healthy lifestyle    Thank you,  Robbi Blanch, Pharm.D [place] [facility]. [facility] [address], [place], [place] [address] Phone: [phone]; Fax: [phone]

## 2024-01-26 ENCOUNTER — Encounter: Payer: Self-pay | Admitting: Pharmacist

## 2024-01-26 NOTE — Assessment & Plan Note (Signed)
 Assessment and plan:  Last lipid panel (08/2023): TC 105, TG 259, LDL-C 27, HDL 39 LDL is at goal (<55), but triglycerides remain elevated (goal <150) Patient was off Vascepa  prior to last labs and did not fast; also taking colestipol  less frequently than prescribed Documented history of statin-associated myopathy Continue current non-statin regimen including Repatha , Vascepa , fenofibrate , and colestipol   Will get updated lipid lab and A1c - will optimize lipid therapy by addition of small dose Crestor  to lower TG to goal and optimize BG level by replacing Basal insulin  to GLP1 agent  Reinforce adherence, healthy lifestyle

## 2024-01-27 LAB — CALPROTECTIN, FECAL: Calprotectin, Fecal: 110 ug/g (ref 0–120)

## 2024-01-27 NOTE — Addendum Note (Signed)
 Addended by: Rayden Dock K on: 01/27/2024 01:29 PM   Modules accepted: Level of Service

## 2024-02-01 ENCOUNTER — Telehealth: Payer: Self-pay | Admitting: Pharmacy Technician

## 2024-02-01 ENCOUNTER — Ambulatory Visit: Payer: Self-pay | Admitting: Pharmacist

## 2024-02-01 ENCOUNTER — Other Ambulatory Visit (HOSPITAL_COMMUNITY): Payer: Self-pay

## 2024-02-01 ENCOUNTER — Telehealth: Payer: Self-pay | Admitting: Pharmacist

## 2024-02-01 LAB — LIPID PANEL
Chol/HDL Ratio: 4.1 ratio (ref 0.0–4.4)
Cholesterol, Total: 143 mg/dL (ref 100–199)
HDL: 35 mg/dL — ABNORMAL LOW (ref >39)
LDL Chol Calc (NIH): 55 mg/dL (ref 0–99)
Triglycerides: 348 mg/dL — ABNORMAL HIGH (ref 0–149)
VLDL Cholesterol Cal: 53 mg/dL — ABNORMAL HIGH (ref 5–40)

## 2024-02-01 LAB — HEMOGLOBIN A1C
Est. average glucose Bld gHb Est-mCnc: 237 mg/dL
Hgb A1c MFr Bld: 9.9 % — ABNORMAL HIGH (ref 4.8–5.6)

## 2024-02-01 NOTE — Telephone Encounter (Signed)
 Pharmacy Patient Advocate Encounter   Received notification from Pt Calls Messages that prior authorization for Ozempic 2MG / 3ml is required/requested.   Insurance verification completed.   The patient is insured through [facility] ADVANT[employer]/RX ADVANCE .   Per test claim: The current 02/01/24 day co-pay is, $135.00 one month.  No PA needed at this time. This test claim was processed through [facility]- copay amounts may vary at other pharmacies due to pharmacy/plan contracts, or as the patient moves through the different stages of their insurance plan.

## 2024-02-06 ENCOUNTER — Ambulatory Visit (INDEPENDENT_AMBULATORY_CARE_PROVIDER_SITE_OTHER): Admitting: *Deleted

## 2024-02-06 DIAGNOSIS — E538 Deficiency of other specified B group vitamins: Secondary | ICD-10-CM

## 2024-02-08 ENCOUNTER — Ambulatory Visit

## 2024-02-09 ENCOUNTER — Telehealth: Payer: Self-pay | Admitting: Cardiology

## 2024-02-09 NOTE — Telephone Encounter (Signed)
 Pt c/o medication issue:  1. Name of Medication:   Prednisone   2. How are you currently taking this medication (dosage and times per day)?   3. Are you having a reaction (difficulty breathing--STAT)?   4. What is your medication issue?   Patient stated she wants a call back to discuss her test results and she has started taking Prednisone  and wants advice on next steps.

## 2024-02-10 NOTE — Telephone Encounter (Signed)
 Result discussed with the patient over phone. Will be seeing her on Sept 17 to start GLP1. Plan is to reduce BG which will help to lower TG.

## 2024-02-15 ENCOUNTER — Encounter: Payer: Self-pay | Admitting: Pharmacist

## 2024-02-15 ENCOUNTER — Ambulatory Visit: Attending: Cardiology | Admitting: Pharmacist

## 2024-02-15 ENCOUNTER — Other Ambulatory Visit (HOSPITAL_COMMUNITY): Payer: Self-pay

## 2024-02-15 DIAGNOSIS — E118 Type 2 diabetes mellitus with unspecified complications: Secondary | ICD-10-CM | POA: Diagnosis not present

## 2024-02-15 MED ORDER — SEMAGLUTIDE(0.25 OR 0.5MG/DOS) 2 MG/3ML ~~LOC~~ SOPN
0.2500 mg | PEN_INJECTOR | SUBCUTANEOUS | 0 refills | Status: DC
  Filled 2024-02-15: qty 3, 56d supply, fill #0

## 2024-02-15 NOTE — Progress Notes (Signed)
 Patient ID: [name]                 DOB: [birth year]                    MRN: [id number]     HPI: [name] is a 63 y.o. female patient referred to pharmacy clinic by Dr.Tolia to initiate GLP1-RA therapy. PMH is significant for hypertension, OSA, CADs/p DES 03/2017, Aortic atherosclerosis, fatty infiltration of liver, DM, HLD, statin myopathy, family hx of premature CAD, MDD.   A1c uncontrolled on metformin  and Lantus . Given CAD patient would benefit from GLP1 . Also patient reported she has not been taking Vascepa  twice daily so advised to start taking twice daily instead of once daily in the evening   Diet:  Breakfast: protein shakes, cheese sticks  Lunch: canned vegetables, sandwich -whole wheat bread, meat  Dinner: green beans, baked chicken,  Snack: banana, cheese, gold fish crackers, popcorn puff   Exercise: none but willing to start resistance exercise - chair exercise 2-3 times per week and 150 min of moderate intensity aerobic exercise     Social History:  Alcohol: none  Smoking: none quit long time back  Labs: Lab Results  Component Value Date   HGBA1C 9.9 (H) 01/31/2024    Wt Readings from Last 1 Encounters:  01/11/24 141 lb (64 kg)    BP Readings from Last 1 Encounters:  01/11/24 (!) 128/58   Pulse Readings from Last 1 Encounters:  01/11/24 72       Component Value Date/Time   CHOL 143 01/31/2024 1013   TRIG 348 (H) 01/31/2024 1013   HDL 35 (L) 01/31/2024 1013   CHOLHDL 4.1 01/31/2024 1013   CHOLHDL 5 08/14/2021 0921   VLDL 53.6 (H) 08/15/2014 0734   LDLCALC 55 01/31/2024 1013   LDLDIRECT 29 09/16/2023 1108   LDLDIRECT 102.0 08/14/2021 9078    Past Medical History:  Diagnosis Date   Allergic rhinitis due to pollen    Allergy     Anxiety state, unspecified    Arthritis    Bronchitis    hx   CAD (coronary artery disease)    s/p NSTEMI in 11/18 treated with a DES to the proximal LAD   Calculus of gallbladder without mention of cholecystitis  or obstruction    Crohn disease (HCC)    Depressive disorder, not elsewhere classified    Diabetes mellitus without complication (HCC)    diet controlled   Esophageal reflux    Fibromyalgia    Grade I diastolic dysfunction    Noted on ECHO   Headache    History of nuclear stress test    ETT-Myoview  10/17: EF 55%, normal perfusion; Low Risk // Nuclear stress test 05/2018:  EF 63, normal perfusion; Low Risk   Myalgia and myositis, unspecified    NSTEMI (non-ST elevated myocardial infarction) (HCC) 04/15/2017   Other and unspecified hyperlipidemia    Palpitations    Pneumonia 15   hx   S/P angioplasty with stent 04/18/17 with DES to pLAD 04/19/2017   Seizures (HCC)    1 seizure as a teenager    Unspecified essential hypertension    Vitamin B deficiency     Current Outpatient Medications on File Prior to Visit  Medication Sig Dispense Refill   acetaminophen  (TYLENOL ) 650 MG CR tablet Take 650 mg by mouth every 8 (eight) hours as needed for pain.     albuterol  (PROVENTIL ) (2.5 MG/3ML) 0.083% nebulizer solution Take  2.5 mg by nebulization every 4 (four) hours as needed for wheezing or shortness of breath.      albuterol  (VENTOLIN  HFA) 108 (90 Base) MCG/ACT inhaler Inhale 1-2 puffs into the lungs every 6 (six) hours as needed for wheezing or shortness of breath.     aspirin  EC 81 MG tablet Take 81 mg by mouth daily. Swallow whole.     Blood Glucose Monitoring Suppl (FREESTYLE LITE) w/Device KIT Use to check blood sugar up to 4 times a day as directed 1 kit 0   budesonide  (ENTOCORT EC ) 3 MG 24 hr capsule Take 3 capsules (9 mg total) by mouth daily. 270 capsule 3   colestipol  (COLESTID ) 1 g tablet TAKE 1 TABLET (1 G TOTAL) BY MOUTH 3 (THREE) TIMES DAILY WITH MEALS. DO NOT TAKE WITHIN 2 HOURS OF ANY OTHER MEDICATIONS 270 tablet 1   Continuous Blood Gluc Sensor (FREESTYLE LIBRE 14 DAY SENSOR) MISC Use to monitor sugars 2 each 11   Continuous Blood Gluc Sensor (FREESTYLE LIBRE SENSOR SYSTEM)  MISC Use for blood sugar monitoring 1 each 0   cyanocobalamin  (VITAMIN B12) 1000 MCG/ML injection Inject SQ into skin every 14 days x 3 months then monthly 2 mL 3   dicyclomine  (BENTYL ) 20 MG tablet Take 1 tablet (20 mg total) by mouth every 6 (six) hours. 60 tablet 0   DULoxetine  (CYMBALTA ) 60 MG capsule Take 1 capsule (60 mg total) by mouth daily. 90 capsule 3   enalapril  (VASOTEC ) 10 MG tablet Take 1 tablet (10 mg) by mouth daily. 90 tablet 3   ergocalciferol  (VITAMIN D2) 1.25 MG (50000 UT) capsule Take 1 capsule (50,000 Units total) by mouth once a week. 12 capsule 0   Evolocumab  (REPATHA  SURECLICK) 140 MG/ML SOAJ Inject 140 mg into the skin every 14 (fourteen) days. 6 mL 3   fenofibrate  (TRICOR ) 145 MG tablet TAKE 1 TABLET BY MOUTH EVERY DAY 90 tablet 1   furosemide  (LASIX ) 40 MG tablet Take 1 tablet (40 mg total) by mouth daily as needed for fluid or edema. 90 tablet 1   glimepiride  (AMARYL ) 4 MG tablet Take 1 tablet (4 mg total) by mouth daily before breakfast. 90 tablet 3   glucose blood (ONETOUCH ULTRA) test strip USE AS DIRECTED 100 strip 0   insulin  glargine (LANTUS  SOLOSTAR) 100 UNIT/ML Solostar Pen INJECT 58 UNITS SUBCUTANEOUSLY EVERY DAY 15 mL 11   Insulin  Pen Needle (PEN NEEDLES 3/16) 31G X 5 MM MISC Use daily 50 each 11   Lancets (FREESTYLE) lancets Use to check blood sugar up to 4 times a day as directed 100 each 0   levocetirizine (XYZAL ) 5 MG tablet Take 1 tablet  by mouth every evening. 90 tablet 3   loperamide  (IMODIUM ) 2 MG capsule Take 1 capsule (2 mg total) by mouth 4 (four) times daily as needed for diarrhea or loose stools. 12 capsule 0   metFORMIN  (GLUCOPHAGE -XR) 500 MG 24 hr tablet Take 2 tablets by mouth daily. 180 tablet 3   metoprolol  succinate (TOPROL -XL) 25 MG 24 hr tablet Take 1.5 tablets (37.5 mg total) by mouth daily. 135 tablet 2   montelukast  (SINGULAIR ) 10 MG tablet Take 1 tablet (10 mg total) by mouth every evening. 90 tablet 3   Multiple Vitamins-Minerals  (MULTIVITAMIN WITH MINERALS) tablet Take 1 tablet by mouth daily.     nitroGLYCERIN  (NITROSTAT ) 0.4 MG SL tablet Place 1 tablet (0.4 mg total) under the tongue every 5 (five) minutes as needed for chest pain.  Call 911 if no relief after 1st dose. 30 tablet 0   ondansetron  (ZOFRAN  ODT) 4 MG disintegrating tablet Take 1 tablet (4 mg total) by mouth every 4 (four) hours as needed for nausea or vomiting. 20 tablet 0   pantoprazole  (PROTONIX ) 40 MG tablet Take 1 tablet (40 mg total) by mouth daily. 90 tablet 3   Probiotic Product (PROBIOTIC DAILY PO) Take 1 capsule by mouth daily.     SKYRIZI  360 MG/2.4ML SOCT 360 mg SQ injection by cartridge and repeat every 8 weeks 2.4 mL 7   sodium chloride  (OCEAN) 0.65 % SOLN nasal spray Place 1 spray into both nostrils daily as needed for congestion.     sucralfate  (CARAFATE ) 1 g tablet Take 1 tablet (1 g total) by mouth 4 (four) times daily -  with meals and at bedtime. 120 tablet 0   VASCEPA  1 g capsule Take 2 capsules by mouth 2 times daily. 360 capsule 2   Vitamin D3 (VITAMIN D ) 25 MCG tablet Take 1,000 Units by mouth at bedtime.     [DISCONTINUED] EPIPEN  2-PAK 0.3 MG/0.3ML SOAJ injection Inject 0.3 mg into the muscle as needed for anaphylaxis. Reported on 06/27/2015  0   Current Facility-Administered Medications on File Prior to Visit  Medication Dose Route Frequency Provider Last Rate Last Admin   cyanocobalamin  (VITAMIN B12) injection 1,000 mcg  1,000 mcg Subcutaneous Q14 Days Nandigam, Kavitha V, MD   1,000 mcg at 02/06/24 1042    Allergies  Allergen Reactions   Lipitor [Atorvastatin ] Other (See Comments)    Elevated ALT   Remicade [Infliximab]     REACTION: SOB   Rosuvastatin  Other (See Comments)    Pt reports causes muscle aches and leg pains and feet to swell   Ticagrelor      Shortness of breath     Assessment/Plan:  1. Weight loss /DM :  A1c uncontrolled on metformin  and Lantus . Given CAD patient would benefit from GLP1.Will start  Ozempic   0.25 mg once a week. Confirmed patient has no personal or family history of medullary thyroid  carcinoma (MTC) or Multiple Endocrine Neoplasia syndrome type 2 (MEN 2). Patient has no previous hx of pancreatitis or gall stone issues. Injection technique reviewed at today's visit.  Advised patient on common side effects including nausea, diarrhea, dyspepsia, decreased appetite, and fatigue. Counseled patient on reducing meal size and how to titrate medication to minimize side effects. Counseled patient to call if intolerable side effects or if experiencing dehydration, abdominal pain, or dizziness. Along with pharmacotherapy, the patient will follow dietary modifications and aim for at least 150 minutes of moderate-intensity exercise per week, plus resistance training twice a week (as recommended by the American Heart Association). This resistance training--such as weightlifting, bodyweight exercises, or using resistance bands, adapted to the patient's ability--will help prevent muscle loss.  phone follow-ups will be conducted every 4 weeks for dose titration and insulin  dose adjustment until the patient reaches the effective therapeutic dose and target weight.    Robbi Blanch, Pharm.D [place] [facility]. [facility] [address], [place], [place] [address] Phone: [phone]; Fax: [phone]

## 2024-02-17 ENCOUNTER — Telehealth: Payer: Self-pay | Admitting: Pharmacist

## 2024-02-17 NOTE — Telephone Encounter (Signed)
 Pt called LVM had question about her supplement and ok to take with her meds. Returning the call back N/A VM box is full can not leave VM. Try to call on spouse phone number. N/A VM has not been set up yet.

## 2024-02-20 ENCOUNTER — Ambulatory Visit

## 2024-02-21 ENCOUNTER — Other Ambulatory Visit: Payer: Self-pay | Admitting: Cardiology

## 2024-02-21 MED ORDER — INSULIN GLARGINE 100 UNIT/ML SOLOSTAR PEN: 50.0000 [IU] | PEN_INJECTOR | Freq: Every day | SUBCUTANEOUS | Status: DC

## 2024-02-21 NOTE — Addendum Note (Signed)
 Addended by: Camila Maita K on: 02/21/2024 03:54 PM   Modules accepted: Orders

## 2024-02-21 NOTE — Telephone Encounter (Signed)
 The patient reported experiencing nocturnal hypoglycemia based on CGM readings and has proactively reduced her basal insulin  dose by 3 units. Despite this adjustment, her blood glucose dropped to 67 mg/dL around 5:99 AM last night, which she treated with juice. She was advised to further reduce her basal insulin  to 50 units starting tonight and to continue adjusting the dose by 3-5 units as needed based on future readings. Additionally, the patient experienced nausea and vomiting after her first dose of Ozempic , which she believes may have been triggered by consuming a large quantity of nuts that day. She expressed concern about taking her second dose, scheduled for Sunday. She was counseled on common gastrointestinal side effects of GLP-1 agonists and advised to eat smaller portions to help manage symptoms. The patient is planning a road trip later this week and intends to pack healthy food options to support stable blood glucose levels.

## 2024-02-22 ENCOUNTER — Telehealth: Payer: Self-pay | Admitting: Cardiology

## 2024-02-22 ENCOUNTER — Telehealth: Payer: Self-pay | Admitting: Internal Medicine

## 2024-02-22 DIAGNOSIS — E118 Type 2 diabetes mellitus with unspecified complications: Secondary | ICD-10-CM

## 2024-02-22 NOTE — Telephone Encounter (Unsigned)
 Copied from CRM #[id number]. Topic: Clinical - Medication Refill >> Feb 22, 2024  4:10 PM Delon T wrote: Medication: Insulin  Pen Needle (PEN NEEDLES 3/16) 31G X 5 MM MISC- needs them today before going out of town  Has the patient contacted their pharmacy? Yes (Agent: If no, request that the patient contact the pharmacy for the refill. If patient does not wish to contact the pharmacy document the reason why and proceed with request.) (Agent: If yes, when and what did the pharmacy advise?)  This is the patient's preferred pharmacy:  [id number] - [place], [place] - [address] [facility] [place] [address] Phone: [phone] Fax: [phone]  Is this the correct pharmacy for this prescription? Yes If no, delete pharmacy and type the correct one.   Has the prescription been filled recently? Yes  Is the patient out of the medication? No  Has the patient been seen for an appointment in the last year OR does the patient have an upcoming appointment? Yes  Can we respond through MyChart? Yes  Agent: Please be advised that Rx refills may take up to 3 business days. We ask that you follow-up with your pharmacy.

## 2024-02-22 NOTE — Telephone Encounter (Signed)
 Called pt's pharmacy and the pharmacist stated that pt's medication was ready to be picked up. I called the pt and informed her that her medication was ready at her pharmacy and that she needed to contact her PCP to request needles. I advised the pt that if she has any other problems, questions or concerns, to give our office a call back. Pt verbalized understanding.

## 2024-02-22 NOTE — Telephone Encounter (Signed)
 Pt requesting refill - system will not allow nurse to pend medication:   Sign at exiting of workspace     Copied from CRM #[id number]. Topic: Clinical - Medication Refill >> Feb 22, 2024  4:10 PM Delon T wrote: Medication: Insulin  Pen Needle (PEN NEEDLES 3/16) 31G X 5 MM MISC- needs them today before going out of town   Has the patient contacted their pharmacy? Yes (Agent: If no, request that the patient contact the pharmacy for the refill. If patient does not wish to contact the pharmacy document the reason why and proceed with request.) (Agent: If yes, when and what did the pharmacy advise?)   This is the patient's preferred pharmacy:  [id number] - [place], [place] - [address] [facility] [place] [address] Phone: [phone] Fax: [phone]   Is this the correct pharmacy for this prescription? Yes If no, delete pharmacy and type the correct one.    Has the prescription been filled recently? Yes   Is the patient out of the medication? No   Has the patient been seen for an appointment in the last year OR does the patient have an upcoming appointment? Yes   Can we respond through MyChart? Yes   Agent: Please be advised that Rx refills may take up to 3 business days. We ask that you follow-up with your pharmacy.

## 2024-02-22 NOTE — Telephone Encounter (Signed)
*  STAT* If patient is at the pharmacy, call can be transferred to refill team.   1. Which medications need to be refilled? (please list name of each medication and dose if known)   fenofibrate  (TRICOR ) 145 MG tablet  Needles for her insulin   2. Would you like to learn more about the convenience, safety, & potential cost savings by using the [facility]?   3. Are you open to using the [facility] (Type [facility]. ).  4. Which pharmacy/location (including street and city if local pharmacy) is medication to be sent to?  [id number] - [place], [place] - [address]   5. Do they need a 30 day or 90 day supply?   90 day  Patient stated she is almost out of this medication and will be going out of town on 9/26.  Patient stated she uses #4 needles.

## 2024-02-23 ENCOUNTER — Other Ambulatory Visit: Payer: Self-pay

## 2024-02-23 MED ORDER — PEN NEEDLES 31G X 5 MM MISC: 1.0000 | Freq: Every day | 3 refills | Status: AC

## 2024-02-23 NOTE — Telephone Encounter (Signed)
 Charlotta to order the same needles. Will not let me do it are you able to send in?

## 2024-02-24 NOTE — Telephone Encounter (Signed)
 see other encounter for more info for GLP1

## 2024-02-27 ENCOUNTER — Other Ambulatory Visit: Payer: Self-pay | Admitting: Internal Medicine

## 2024-02-27 NOTE — Telephone Encounter (Signed)
 Copied from CRM [id number]. Topic: Clinical - Medication Refill >> Feb 27, 2024 10:37 AM Laymon HERO wrote: Medication: Lancets (FREESTYLE) lancets  Has the patient contacted their pharmacy? Yes (Agent: If no, request that the patient contact the pharmacy for the refill. If patient does not wish to contact the pharmacy document the reason why and proceed with request.) (Agent: If yes, when and what did the pharmacy advise?)  This is the patient's preferred pharmacy:  [facility]  [address], [place], [place] [address] [phone]  Is this the correct pharmacy for this prescription? Yes If no, delete pharmacy and type the correct one.   Has the prescription been filled recently? Yes  Is the patient out of the medication? Yes  Has the patient been seen for an appointment in the last year OR does the patient have an upcoming appointment? Yes  Can we respond through MyChart? Yes  Agent: Please be advised that Rx refills may take up to 3 business days. We ask that you follow-up with your pharmacy.

## 2024-02-28 ENCOUNTER — Ambulatory Visit: Payer: Self-pay

## 2024-02-28 ENCOUNTER — Other Ambulatory Visit: Payer: Self-pay | Admitting: Internal Medicine

## 2024-02-28 NOTE — Telephone Encounter (Signed)
 FYI Only or Action Required?: Action required by provider: refill Lantus .  Patient was last seen in primary care on 10/17/2023 by Rollene Almarie LABOR, MD.  Called Nurse Triage reporting Medication Problem.  Symptoms began several days ago.  Interventions attempted: Nothing.  Symptoms are: stable.  Triage Disposition: Call PCP When Office is Open  Patient/caregiver understands and will follow disposition?: Yes   Copied from CRM [id number]. Topic: Clinical - Red Word Triage >> Feb 28, 2024 12:07 PM Roselie BROCKS wrote: Red Word that prompted transfer to Nurse Triage: Patient states she has not had any insulin  for her blood sugar since Saturday because she has no lancets, I a request is in for them) and she's having issues Reason for Disposition  [1] Caller has NON-URGENT medicine question about med that doctor (or NP/PA) prescribed AND [2] triager unable to answer question  Answer Assessment - Initial Assessment Questions Patient does not need lancets, she needs Lantus . Separate request sent.   [place].  Forgot Lantus  at home, has not taken since Saturday or Sunday Reports that per her monitor, blood sugars are in the green  Protocols used: Medication Refill and Renewal Call-A-AH

## 2024-02-28 NOTE — Telephone Encounter (Signed)
 Patient is out of town and forgot her insulin  at home. Requesting refill to out of town pharmacy included in this request  Has not had Lantus  since Saturday or Sunday  States that her blood sugars have remained in the green on her monitor

## 2024-02-29 ENCOUNTER — Encounter: Payer: Self-pay | Admitting: Internal Medicine

## 2024-02-29 NOTE — Telephone Encounter (Signed)
 You did not prescribe her insulin . Is this ok to refill?

## 2024-02-29 NOTE — Telephone Encounter (Signed)
 This encounter was created in error - please disregard.

## 2024-03-01 ENCOUNTER — Other Ambulatory Visit: Payer: Self-pay | Admitting: Internal Medicine

## 2024-03-01 MED ORDER — INSULIN GLARGINE 100 UNIT/ML SOLOSTAR PEN: 50.0000 [IU] | PEN_INJECTOR | Freq: Every day | SUBCUTANEOUS | 11 refills | Status: DC

## 2024-03-01 NOTE — Telephone Encounter (Signed)
 I have put this in the E2C2 error chat

## 2024-03-01 NOTE — Telephone Encounter (Signed)
 Sent in another encounter. Can we have shannon report the error that the dept was not changed and action was required.

## 2024-03-02 ENCOUNTER — Ambulatory Visit

## 2024-03-02 VITALS — Ht 63.0 in | Wt 141.0 lb

## 2024-03-02 DIAGNOSIS — Z0000 Encounter for general adult medical examination without abnormal findings: Secondary | ICD-10-CM

## 2024-03-02 NOTE — Progress Notes (Signed)
 Subjective:   [name] is a 63 y.o. who presents for a Medicare Wellness preventive visit.  As a reminder, Annual Wellness Visits don't include a physical exam, and some assessments may be limited, especially if this visit is performed virtually. We may recommend an in-person follow-up visit with your provider if needed.  Visit Complete: Virtual I connected with  Nyana K Brent on 03/02/24 by a audio enabled telemedicine application and verified that I am speaking with the correct person using two identifiers.  Patient Location: Home  Provider Location: Office/Clinic  I discussed the limitations of evaluation and management by telemedicine. The patient expressed understanding and agreed to proceed.  Vital Signs: Because this visit was a virtual/telehealth visit, some criteria may be missing or patient reported. Any vitals not documented were not able to be obtained and vitals that have been documented are patient reported.  VideoDeclined- This patient declined Librarian, academic. Therefore the visit was completed with audio only.  Persons Participating in Visit: Patient.  AWV Questionnaire: No: Patient Medicare AWV questionnaire was not completed prior to this visit.  Cardiac Risk Factors include: advanced age (>24men, >83 women);diabetes mellitus;dyslipidemia;hypertension     Objective:    Today's Vitals   03/02/24 0815  Weight: 141 lb (64 kg)  Height: 5' 3 (1.6 m)   Body mass index is 24.98 kg/m.     03/02/2024    8:15 AM 07/18/2023   12:59 PM 06/18/2022    3:51 PM 10/18/2020   11:52 AM 04/17/2020    3:38 AM 04/23/2019    9:37 PM 12/05/2018    8:07 AM  Advanced Directives  Does Patient Have a Medical Advance Directive? No No No No No No Yes  Type of Building surveyor of Healthcare Power of Attorney in Chart?       No - copy requested   Would patient like information on creating a medical advance  directive? No - Patient declined No - Patient declined No - Patient declined  Yes (Inpatient - patient defers creating a medical advance directive and declines information at this time) No - Patient declined      Data saved with a previous flowsheet row definition    Current Medications (verified) Outpatient Encounter Medications as of 03/02/2024  Medication Sig   acetaminophen  (TYLENOL ) 650 MG CR tablet Take 650 mg by mouth every 8 (eight) hours as needed for pain.   albuterol  (PROVENTIL ) (2.5 MG/3ML) 0.083% nebulizer solution Take 2.5 mg by nebulization every 4 (four) hours as needed for wheezing or shortness of breath.    albuterol  (VENTOLIN  HFA) 108 (90 Base) MCG/ACT inhaler Inhale 1-2 puffs into the lungs every 6 (six) hours as needed for wheezing or shortness of breath.   aspirin  EC 81 MG tablet Take 81 mg by mouth daily. Swallow whole.   Blood Glucose Monitoring Suppl (FREESTYLE LITE) w/Device KIT Use to check blood sugar up to 4 times a day as directed   budesonide  (ENTOCORT EC ) 3 MG 24 hr capsule Take 3 capsules (9 mg total) by mouth daily.   colestipol  (COLESTID ) 1 g tablet TAKE 1 TABLET (1 G TOTAL) BY MOUTH 3 (THREE) TIMES DAILY WITH MEALS. DO NOT TAKE WITHIN 2 HOURS OF ANY OTHER MEDICATIONS   Continuous Blood Gluc Sensor (FREESTYLE LIBRE 14 DAY SENSOR) MISC Use to monitor sugars   Continuous Blood Gluc Sensor (FREESTYLE LIBRE SENSOR SYSTEM) MISC Use for blood  sugar monitoring   cyanocobalamin  (VITAMIN B12) 1000 MCG/ML injection Inject SQ into skin every 14 days x 3 months then monthly   dicyclomine  (BENTYL ) 20 MG tablet Take 1 tablet (20 mg total) by mouth every 6 (six) hours.   DULoxetine  (CYMBALTA ) 60 MG capsule Take 1 capsule (60 mg total) by mouth daily.   enalapril  (VASOTEC ) 10 MG tablet Take 1 tablet (10 mg) by mouth daily.   ergocalciferol  (VITAMIN D2) 1.25 MG (50000 UT) capsule Take 1 capsule (50,000 Units total) by mouth once a week.   Evolocumab  (REPATHA  SURECLICK) 140  MG/ML SOAJ Inject 140 mg into the skin every 14 (fourteen) days.   fenofibrate  (TRICOR ) 145 MG tablet TAKE 1 TABLET BY MOUTH EVERY DAY   furosemide  (LASIX ) 40 MG tablet Take 1 tablet (40 mg total) by mouth daily as needed for fluid or edema.   glimepiride  (AMARYL ) 4 MG tablet Take 1 tablet (4 mg total) by mouth daily before breakfast.   glucose blood (ONETOUCH ULTRA) test strip USE AS DIRECTED   insulin  glargine (LANTUS ) 100 UNIT/ML Solostar Pen Inject 50 Units into the skin daily.   Insulin  Pen Needle (PEN NEEDLES) 31G X 5 MM MISC 1 each by Does not apply route daily.   Lancets (FREESTYLE) lancets Use to check blood sugar up to 4 times a day as directed   levocetirizine (XYZAL ) 5 MG tablet Take 1 tablet  by mouth every evening.   loperamide  (IMODIUM ) 2 MG capsule Take 1 capsule (2 mg total) by mouth 4 (four) times daily as needed for diarrhea or loose stools.   metFORMIN  (GLUCOPHAGE -XR) 500 MG 24 hr tablet Take 2 tablets by mouth daily.   metoprolol  succinate (TOPROL -XL) 25 MG 24 hr tablet Take 1.5 tablets (37.5 mg total) by mouth daily.   montelukast  (SINGULAIR ) 10 MG tablet Take 1 tablet (10 mg total) by mouth every evening.   Multiple Vitamins-Minerals (MULTIVITAMIN WITH MINERALS) tablet Take 1 tablet by mouth daily.   nitroGLYCERIN  (NITROSTAT ) 0.4 MG SL tablet Place 1 tablet (0.4 mg total) under the tongue every 5 (five) minutes as needed for chest pain.  Call 911 if no relief after 1st dose.   ondansetron  (ZOFRAN  ODT) 4 MG disintegrating tablet Take 1 tablet (4 mg total) by mouth every 4 (four) hours as needed for nausea or vomiting.   pantoprazole  (PROTONIX ) 40 MG tablet Take 1 tablet (40 mg total) by mouth daily.   Probiotic Product (PROBIOTIC DAILY PO) Take 1 capsule by mouth daily.   Semaglutide ,0.25 or 0.5MG /DOS, 2 MG/3ML SOPN Inject 0.25 mg into the skin once a week.   SKYRIZI  360 MG/2.4ML SOCT 360 mg SQ injection by cartridge and repeat every 8 weeks   sodium chloride  (OCEAN) 0.65 %  SOLN nasal spray Place 1 spray into both nostrils daily as needed for congestion.   sucralfate  (CARAFATE ) 1 g tablet Take 1 tablet (1 g total) by mouth 4 (four) times daily -  with meals and at bedtime.   VASCEPA  1 g capsule Take 2 capsules by mouth 2 times daily.   Vitamin D3 (VITAMIN D ) 25 MCG tablet Take 1,000 Units by mouth at bedtime.   [DISCONTINUED] EPIPEN  2-PAK 0.3 MG/0.3ML SOAJ injection Inject 0.3 mg into the muscle as needed for anaphylaxis. Reported on 06/27/2015   Facility-Administered Encounter Medications as of 03/02/2024  Medication   cyanocobalamin  (VITAMIN B12) injection 1,000 mcg    Allergies (verified) Lipitor [atorvastatin ], Remicade [infliximab], Rosuvastatin , and Ticagrelor    History: Past Medical History:  Diagnosis  Date   Allergic rhinitis due to pollen    Allergy     Anxiety state, unspecified    Arthritis    Bronchitis    hx   CAD (coronary artery disease)    s/p NSTEMI in 11/18 treated with a DES to the proximal LAD   Calculus of gallbladder without mention of cholecystitis or obstruction    Crohn disease (HCC)    Depressive disorder, not elsewhere classified    Diabetes mellitus without complication (HCC)    diet controlled   Esophageal reflux    Fibromyalgia    Grade I diastolic dysfunction    Noted on ECHO   Headache    History of nuclear stress test    ETT-Myoview  10/17: EF 55%, normal perfusion; Low Risk // Nuclear stress test 05/2018:  EF 63, normal perfusion; Low Risk   Myalgia and myositis, unspecified    NSTEMI (non-ST elevated myocardial infarction) (HCC) 04/15/2017   Other and unspecified hyperlipidemia    Palpitations    Pneumonia 13   hx   S/P angioplasty with stent 04/18/17 with DES to pLAD 04/19/2017   Seizures (HCC)    1 seizure as a teenager    Unspecified essential hypertension    Vitamin B deficiency    Past Surgical History:  Procedure Laterality Date   BIOPSY  12/05/2018   Procedure: BIOPSY;  Surgeon: Shila Gustav GAILS,  MD;  Location: [facility];  Service: Endoscopy;;   CHOLECYSTECTOMY N/A 09/19/2014   Procedure: LAPAROSCOPIC CHOLECYSTECTOMY WITH INTRAOPERATIVE CHOLANGIOGRAM;  Surgeon: Donnice Lima, MD;  Location: [facility] OR;  Service: General;  Laterality: N/A;   COLONOSCOPY WITH PROPOFOL  N/A 12/05/2018   Procedure: COLONOSCOPY WITH PROPOFOL ;  Surgeon: Shila Gustav GAILS, MD;  Location: [facility];  Service: Endoscopy;  Laterality: N/A;  chromoendoscopy   CORONARY PRESSURE/FFR STUDY N/A 04/18/2017   Procedure: INTRAVASCULAR PRESSURE WIRE/FFR STUDY;  Surgeon: Swaziland, Peter M, MD;  [facility] INVASIVE CV LAB;  Service: Cardiovascular;  Laterality: N/A;   CORONARY STENT INTERVENTION N/A 04/18/2017   Procedure: CORONARY STENT INTERVENTION;  Surgeon: Swaziland, Peter M, MD;  Location: [facility] INVASIVE CV LAB;  Service: Cardiovascular;  Laterality: N/A;   ESOPHAGOGASTRODUODENOSCOPY     EXPLORATORY LAPAROTOMY     for endometriosis   LEFT HEART CATH AND CORONARY ANGIOGRAPHY N/A 04/18/2017   Procedure: LEFT HEART CATH AND CORONARY ANGIOGRAPHY;  Surgeon: Swaziland, Peter M, MD;  Location: [facility] INVASIVE CV LAB;  Service: Cardiovascular;  Laterality: N/A;   POLYPECTOMY  12/05/2018   Procedure: POLYPECTOMY;  Surgeon: Shila Gustav GAILS, MD;  Location: [facility];  Service: Endoscopy;;   SUBMUCOSAL LIFTING INJECTION  12/05/2018   Procedure: SUBMUCOSAL LIFTING INJECTION;  Surgeon: Shila Gustav GAILS, MD;  Location: [facility];  Service: Endoscopy;;   TONSILLECTOMY AND ADENOIDECTOMY     TUBAL LIGATION     BILATERAL   Family History  Problem Relation Age of Onset   Lung cancer Maternal Grandmother    Stroke Father    Hypertension Mother    Heart attack Mother    Heart disease Mother    Hypertension Brother    Heart disease Brother    Hypertension Sister    Heart disease Sister    Coronary artery disease Brother    Colon cancer Neg Hx    Stomach cancer Neg Hx    Esophageal cancer Neg Hx    Rectal cancer Neg Hx    Social  History   Socioeconomic History   Marital status: Married    Spouse name:  Dane   Number of children: 2   Years of education: 12+   Highest education level: Not on file  Occupational History   Occupation: ACCOUNT REP.    Employer: [employer]    Comment: retired  Tobacco Use   Smoking status: Former    Current packs/day: 0.00    Average packs/day: 1 pack/day for 35.0 years (35.0 ttl pk-yrs)    Types: Cigarettes    Start date: 05/31/1969    Quit date: 05/31/2004    Years since quitting: 19.7   Smokeless tobacco: Never  Vaping Use   Vaping status: Never Used  Substance and Sexual Activity   Alcohol use: No   Drug use: No   Sexual activity: Yes    Comment: married  Other Topics Concern   Not on file  Social History Narrative   Lives with her husband and their pets. Her children are adults and live independently.   Social Drivers of Corporate investment banker Strain: Low Risk  (03/02/2024)   Overall Financial Resource Strain (CARDIA)    Difficulty of Paying Living Expenses: Not hard at all  Food Insecurity: No Food Insecurity (03/02/2024)   Hunger Vital Sign    Worried About Running Out of Food in the Last Year: Never true    Ran Out of Food in the Last Year: Never true  Transportation Needs: No Transportation Needs (03/02/2024)   PRAPARE - Administrator, Civil Service (Medical): No    Lack of Transportation (Non-Medical): No  Physical Activity: Inactive (03/02/2024)   Exercise Vital Sign    Days of Exercise per Week: 0 days    Minutes of Exercise per Session: 0 min  Stress: No Stress Concern Present (03/02/2024)   [employer] of Occupational Health - Occupational Stress Questionnaire    Feeling of Stress: Only a little  Social Connections: Moderately Isolated (03/02/2024)   Social Connection and Isolation Panel    Frequency of Communication with Friends and Family: More than three times a week    Frequency of Social Gatherings with Friends  and Family: Once a week    Attends Religious Services: Never    Database administrator or Organizations: No    Attends Engineer, structural: Never    Marital Status: Married    Tobacco Counseling Counseling given: Not Answered    Clinical Intake:  Pre-visit preparation completed: Yes  Pain : No/denies pain     BMI - recorded: 24.98 Nutritional Status: BMI of 19-24  Normal Nutritional Risks: None Diabetes: Yes CBG done?: No Did pt. bring in CBG monitor from home?: No  Lab Results  Component Value Date   HGBA1C 9.9 (H) 01/31/2024   HGBA1C 8.9 10/17/2023   HGBA1C 8.3 (A) 01/10/2023     How often do you need to have someone help you when you read instructions, pamphlets, or other written materials from your doctor or pharmacy?: 1 - Never  Interpreter Needed?: No  Information entered by :: Verdie Saba, CMA   Activities of Daily Living     03/02/2024    8:19 AM  In your present state of health, do you have any difficulty performing the following activities:  Hearing? 0  Vision? 0  Difficulty concentrating or making decisions? 0  Walking or climbing stairs? 0  Dressing or bathing? 0  Doing errands, shopping? 0  Preparing Food and eating ? N  Using the Toilet? N  In the past six months, have you accidently  leaked urine? Y  Comment wears a pad  Do you have problems with loss of bowel control? Y  Managing your Medications? Y  Comment wears a pad - Chrons dz  Managing your Finances? N  Housekeeping or managing your Housekeeping? N    Patient Care Team: Rollene Almarie LABOR, MD as PCP - General (Internal Medicine) Michele Richardson, DO as PCP - Cardiology (Cardiology) Rolan Ezra RAMAN, MD as Consulting Physician (Cardiology) Neysa Reggy BIRCH, MD as Consulting Physician (Pulmonary Disease) Obie Princella HERO, MD (Inactive) as Consulting Physician (Gastroenterology) Lenon Oneil BRAVO, MD as Consulting Physician (Obstetrics and Gynecology)  I have updated your  Care Teams any recent Medical Services you may have received from other providers in the past year.     Assessment:   This is a routine wellness examination for Nakaya.  Hearing/Vision screen Hearing Screening - Comments:: Denies hearing difficulties   Vision Screening - Comments:: Wears rx glasses - up to date with routine eye exams with an Ophthalmologist    Goals Addressed               This Visit's Progress     Patient Stated (pt-stated)        Patient stated she is watching her diet and weight - managing new med       Depression Screen     03/02/2024    8:22 AM 10/17/2023   11:07 AM 07/18/2023    1:01 PM 04/19/2023    1:23 PM 04/06/2022    2:56 PM 12/16/2021    8:43 AM 08/14/2021    8:53 AM  PHQ 2/9 Scores  PHQ - 2 Score 0 2 0 0 2 2 0  PHQ- 9 Score 1 7  0 5 6 0    Fall Risk     03/02/2024    8:19 AM 07/18/2023   12:59 PM 04/19/2023    1:23 PM 12/16/2021    8:43 AM  Fall Risk   Falls in the past year? 0 0 0 0  Number falls in past yr: 0  0 0  Injury with Fall? 0  0 0  Risk for fall due to : No Fall Risks No Fall Risks    Follow up Falls evaluation completed;Falls prevention discussed Falls evaluation completed Falls evaluation completed     MEDICARE RISK AT HOME:  Medicare Risk at Home Any stairs in or around the home?: Yes If so, are there any without handrails?: No Home free of loose throw rugs in walkways, pet beds, electrical cords, etc?: Yes Adequate lighting in your home to reduce risk of falls?: Yes Life alert?: No Use of a cane, walker or w/c?: No Grab bars in the bathroom?: No Shower chair or bench in shower?: No Elevated toilet seat or a handicapped toilet?: No  TIMED UP AND GO:  Was the test performed?  No  Cognitive Function: 6CIT completed        03/02/2024    8:34 AM  6CIT Screen  What Year? 0 points  What month? 0 points  What time? 0 points  Count back from 20 0 points  Months in reverse 0 points  Repeat phrase 2 points  Total  Score 2 points    Immunizations Immunization History  Administered Date(s) Administered   Hep A / Hep B 06/11/2016, 07/19/2016, 12/10/2016   Influenza Split 03/01/2011, 02/29/2012   Influenza,inj,Quad PF,6+ Mos 03/10/2021, 03/02/2022   Pneumococcal Polysaccharide-23 08/23/2013, 06/11/2016   Tdap 02/28/2013    Screening Tests  Health Maintenance  Topic Date Due   Diabetic kidney evaluation - Urine ACR  Never done   Zoster Vaccines- Shingrix (1 of 2) Never done   Pneumococcal Vaccine: 50+ Years (2 of 2 - PCV) 06/11/2017   DTaP/Tdap/Td (2 - Td or Tdap) 03/01/2023   DEXA SCAN  Never done   Influenza Vaccine  12/30/2023   FOOT EXAM  01/10/2024   COVID-19 Vaccine (1 - 2024-25 season) Never done   OPHTHALMOLOGY EXAM  07/28/2024   HEMOGLOBIN A1C  07/30/2024   Diabetic kidney evaluation - eGFR measurement  01/10/2025   Medicare Annual Wellness (AWV)  03/02/2025   Mammogram  08/24/2025   Colonoscopy  09/02/2026   Cervical Cancer Screening (HPV/Pap Cotest)  08/30/2028   Hepatitis B Vaccines 19-59 Average Risk  Completed   Hepatitis C Screening  Completed   HIV Screening  Completed   HPV VACCINES  Aged Out   Meningococcal B Vaccine  Aged Out    Health Maintenance Items Addressed:  03/02/2024  Additional Screening:  Vision Screening: Recommended annual ophthalmology exams for early detection of glaucoma and other disorders of the eye. Is the patient up to date with their annual eye exam?  Yes  Who is the provider or what is the name of the office in which the patient attends annual eye exams? Patient unable to recall the Ophthalmologist's name  Dental Screening: Recommended annual dental exams for proper oral hygiene  Community Resource Referral / Chronic Care Management: CRR required this visit?  No   CCM required this visit?  No   Plan:    I have personally reviewed and noted the following in the patient's chart:   Medical and social history Use of alcohol, tobacco or  illicit drugs  Current medications and supplements including opioid prescriptions. Patient is not currently taking opioid prescriptions. Functional ability and status Nutritional status Physical activity Advanced directives List of other physicians Hospitalizations, surgeries, and ER visits in previous 12 months Vitals Screenings to include cognitive, depression, and falls Referrals and appointments  In addition, I have reviewed and discussed with patient certain preventive protocols, quality metrics, and best practice recommendations. A written personalized care plan for preventive services as well as general preventive health recommendations were provided to patient.   Verdie CHRISTELLA Saba, CMA   03/02/2024   After Visit Summary: (MyChart) Due to this being a telephonic visit, the after visit summary with patients personalized plan was offered to patient via MyChart   Notes: Scheduled a 5-mth Diabetes f/u w/PCP.

## 2024-03-02 NOTE — Patient Instructions (Addendum)
 [name],  Thank you for taking the time for your Medicare Wellness Visit. I appreciate your continued commitment to your health goals. Please review the care plan we discussed, and feel free to reach out if I can assist you further.  Medicare recommends these wellness visits once per year to help you and your care team stay ahead of potential health issues. These visits are designed to focus on prevention, allowing your provider to concentrate on managing your acute and chronic conditions during your regular appointments.  Please note that Annual Wellness Visits do not include a physical exam. Some assessments may be limited, especially if the visit was conducted virtually. If needed, we may recommend a separate in-person follow-up with your provider.  Ongoing Care Seeing your primary care provider every 3 to 6 months helps us  monitor your health and provide consistent, personalized care  Referrals If a referral was made during today's visit and you haven't received any updates within two weeks, please contact the referred provider directly to check on the status.  Recommended Screenings:  Health Maintenance  Topic Date Due   Yearly kidney health urinalysis for diabetes  Never done   Zoster (Shingles) Vaccine (1 of 2) Never done   Pneumococcal Vaccine for age over 58 (2 of 2 - PCV) 06/11/2017   DTaP/Tdap/Td vaccine (2 - Td or Tdap) 03/01/2023   DEXA scan (bone density measurement)  Never done   Flu Shot  12/30/2023   Complete foot exam   01/10/2024   COVID-19 Vaccine (1 - 2024-25 season) Never done   Eye exam for diabetics  07/28/2024   Hemoglobin A1C  07/30/2024   Yearly kidney function blood test for diabetes  01/10/2025   Medicare Annual Wellness Visit  03/02/2025   Breast Cancer Screening  08/24/2025   Colon Cancer Screening  09/02/2026   Pap with HPV screening  08/30/2028   Hepatitis B Vaccine  Completed   Hepatitis C Screening  Completed   HIV Screening  Completed   HPV  Vaccine  Aged Out   Meningitis B Vaccine  Aged Out       03/02/2024    8:15 AM  Advanced Directives  Does Patient Have a Medical Advance Directive? No  Would patient like information on creating a medical advance directive? No - Patient declined   Advance Care Planning is important because it: Ensures you receive medical care that aligns with your values, goals, and preferences. Provides guidance to your family and loved ones, reducing the emotional burden of decision-making during critical moments.  Vision: Annual vision screenings are recommended for early detection of glaucoma, cataracts, and diabetic retinopathy. These exams can also reveal signs of chronic conditions such as diabetes and high blood pressure.  Dental: Annual dental screenings help detect early signs of oral cancer, gum disease, and other conditions linked to overall health, including heart disease and diabetes.

## 2024-03-05 MED ORDER — FREESTYLE LANCETS MISC: 0 refills | Status: AC

## 2024-03-09 ENCOUNTER — Telehealth: Payer: Self-pay | Admitting: Gastroenterology

## 2024-03-09 NOTE — Telephone Encounter (Signed)
 Inbound call from patient requesting to schedule for a B12 shot that she missed. Patient is requesting a call back. Please advise.

## 2024-03-12 ENCOUNTER — Telehealth: Payer: Self-pay | Admitting: *Deleted

## 2024-03-12 NOTE — Telephone Encounter (Signed)
 Received fax today from [employer] that the patients SKYRIZI  is approved until 05/30/2025  Will scan approval   FYI Rx Team

## 2024-03-12 NOTE — Telephone Encounter (Signed)
 Spoke with the patient. Scheduled the appointment for her B12.

## 2024-03-14 ENCOUNTER — Ambulatory Visit

## 2024-03-19 ENCOUNTER — Ambulatory Visit: Admitting: Internal Medicine

## 2024-03-19 ENCOUNTER — Ambulatory Visit: Payer: Self-pay

## 2024-03-19 NOTE — Telephone Encounter (Signed)
 Patient offered an appointment today at PCP office but states she cannot come because she is taking care of family (86 month old) and cannot come today   FYI Only or Action Required?: Action required by provider: clinical question for provider and update on patient condition.  Patient was last seen in primary care on 10/17/2023 by Rollene Almarie LABOR, MD.  Called Nurse Triage reporting Vomiting and Diarrhea.  Symptoms began last night.  Interventions attempted: Rest, hydration, or home remedies.  Symptoms are: a little better than last night.  Triage Disposition: See HCP Within 4 Hours (Or PCP Triage)  Patient/caregiver understands and will follow disposition?: No     Reason for Disposition  [1] SEVERE diarrhea (e.g., 7 or more times / day more than normal) AND [2] age > 60 years  Answer Assessment - Initial Assessment Questions Nausea/vomiting/diarrhea last night and patient states that she thinks its from overeating Patient started Ozempic  5 weeks ago and her last dose was last Wednesday. She states that she believes she overate and knows that when she doesn't eat properly this medication can upset her stomach She denies any other major symptoms and states she feels better than she did She is able to eat a small amount---piece of toast Able to drink water and some protein drink She is advised to stay hydrated Offered an appointment at her PCP office today---She states she cannot come in today for an appointment due to caring for 59 month old and states she believes she will get better and will seek immediate medical attention if anything changes Patient is advised to call us  back if anything changes or with any further questions/concerns. Patient is advised that if anything worsens to go to the Emergency Room. Patient verbalized understanding.    1. DIARRHEA SEVERITY: How bad is the diarrhea? How many more stools have you had in the past 24 hours than normal?       multiple 2. ONSET: When did the diarrhea begin?      Last night after eating 3. STOOL DESCRIPTION:  How loose or watery is the diarrhea? What is the stool color? Is there any blood or mucous in the stool?     loose 4. VOMITING: Are you also vomiting? If Yes, ask: How many times in the past 24 hours?      Patient states 7-8 times but she made herself throw up to feel better twice 5. ABDOMEN PAIN: Are you having any abdomen pain? If Yes, ask: What does it feel like? (e.g., crampy, dull, intermittent, constant)      denies 6. ABDOMEN PAIN SEVERITY: If present, ask: How bad is the pain?  (e.g., Scale 1-10; mild, moderate, or severe)     N/a 7. ORAL INTAKE: If vomiting, Have you been able to drink liquids? How much liquids have you had in the past 24 hours?     Toast and some protein shake and water 8. HYDRATION: Any signs of dehydration? (e.g., dry mouth [not just dry lips], too weak to stand, dizziness, new weight loss) When did you last urinate?     Able to drink some of a protein drink or shake 11. OTHER SYMPTOMS: Do you have any other symptoms? (e.g., fever, blood in stool)       Nausea, Vomiting  Protocols used: Diarrhea-A-AH

## 2024-03-19 NOTE — Telephone Encounter (Signed)
 Ok with visit if no improvement or worsening

## 2024-03-21 ENCOUNTER — Other Ambulatory Visit: Payer: Self-pay | Admitting: Internal Medicine

## 2024-03-21 NOTE — Telephone Encounter (Unsigned)
 Copied from CRM [id number]. Topic: Clinical - Medication Refill >> Mar 21, 2024 10:11 AM Thersia C wrote: Medication: metFORMIN  (GLUCOPHAGE -XR) 500 MG 24 hr tablet - 100 day supply   Has the patient contacted their pharmacy? Yes (Agent: If no, request that the patient contact the pharmacy for the refill. If patient does not wish to contact the pharmacy document the reason why and proceed with request.) (Agent: If yes, when and what did the pharmacy advise?)  This is the patient's preferred pharmacy:  [id number] [place], [place] - [address] [facility] [place] [address] Phone: [phone] Fax: [phone]    Is this the correct pharmacy for this prescription? Yes If no, delete pharmacy and type the correct one.   Has the prescription been filled recently? No  Is the patient out of the medication? Yes  Has the patient been seen for an appointment in the last year OR does the patient have an upcoming appointment? Yes  Can we respond through MyChart? Yes  Agent: Please be advised that Rx refills may take up to 3 business days. We ask that you follow-up with your pharmacy.

## 2024-03-22 ENCOUNTER — Other Ambulatory Visit: Payer: Self-pay

## 2024-03-22 MED ORDER — METFORMIN HCL ER 500 MG PO TB24: 1000.0000 mg | ORAL_TABLET | Freq: Every day | ORAL | 0 refills | Status: DC

## 2024-03-22 NOTE — Telephone Encounter (Signed)
 Sent in refill to local pharmacy below

## 2024-04-05 ENCOUNTER — Ambulatory Visit: Payer: Self-pay | Admitting: *Deleted

## 2024-04-05 NOTE — Telephone Encounter (Signed)
 FYI Only or Action Required?: FYI only for provider: appointment scheduled on 11/17.  Patient was last seen in primary care on 10/17/2023 by Rollene Almarie LABOR, MD.  Called Nurse Triage reporting Back Pain.  Symptoms began several months ago.  Interventions attempted: Rest, hydration, or home remedies.  Symptoms are: unchanged.  Triage Disposition: See PCP Within 2 Weeks  Patient/caregiver understands and will follow disposition?: Yes  Copied from CRM [id number]. Topic: Clinical - Red Word Triage >> Apr 05, 2024  9:26 AM Willma SAUNDERS wrote: [facility] that prompted transfer to Nurse Triage: Patient states she is having a lot of pain in her back and her shoulder. Has an MRI next week for her shoulder, but the pain in her back is getting worse. Reason for Disposition  Back pain is a chronic symptom (recurrent or ongoing AND present > 4 weeks)  Answer Assessment - Initial Assessment Questions 1. ONSET: When did the pain begin? (e.g., minutes, hours, days)     3-4 months 2. LOCATION: Where does it hurt? (upper, mid or lower back)     Mostly on left side- mid/lower 3. SEVERITY: How bad is the pain?  (e.g., Scale 1-10; mild, moderate, or severe)     1-2/10 4. PATTERN: Is the pain constant? (e.g., yes, no; constant, intermittent)      Constant when standing, hard to roll over in bed/get out of bed, going siting to standing painful 5. RADIATION: Does the pain shoot into your legs or somewhere else?     No radiation  6. CAUSE:  What do you think is causing the back pain?      Arthritis, degenerating disc disease  7. BACK OVERUSE:  Any recent lifting of heavy objects, strenuous work or exercise?     no 8. MEDICINES: What have you taken so far for the pain? (e.g., nothing, acetaminophen , NSAIDS)     OTC arthritis pain medication 9. NEUROLOGIC SYMPTOMS: Do you have any weakness, numbness, or problems with bowel/bladder control?     no 10. OTHER SYMPTOMS: Do you have any  other symptoms? (e.g., fever, abdomen pain, burning with urination, blood in urine)       some abdominal pain- LRQ, urine stronger- not pale  Protocols used: Back Pain-A-AH

## 2024-04-06 ENCOUNTER — Other Ambulatory Visit: Payer: Self-pay | Admitting: Cardiology

## 2024-04-06 ENCOUNTER — Other Ambulatory Visit (HOSPITAL_COMMUNITY): Payer: Self-pay

## 2024-04-06 DIAGNOSIS — E118 Type 2 diabetes mellitus with unspecified complications: Secondary | ICD-10-CM

## 2024-04-06 MED ORDER — OZEMPIC (0.25 OR 0.5 MG/DOSE) 2 MG/3ML ~~LOC~~ SOPN
0.5000 mg | PEN_INJECTOR | SUBCUTANEOUS | 0 refills | Status: DC
  Filled 2024-04-06: qty 3, fill #0
  Filled 2024-04-09: qty 3, 28d supply, fill #0

## 2024-04-07 ENCOUNTER — Other Ambulatory Visit: Payer: Self-pay | Admitting: Gastroenterology

## 2024-04-09 ENCOUNTER — Other Ambulatory Visit (HOSPITAL_COMMUNITY): Payer: Self-pay

## 2024-04-16 ENCOUNTER — Encounter: Payer: Self-pay | Admitting: Internal Medicine

## 2024-04-16 ENCOUNTER — Ambulatory Visit

## 2024-04-16 ENCOUNTER — Ambulatory Visit: Admitting: Internal Medicine

## 2024-04-16 VITALS — BP 110/60 | HR 77 | Temp 98.8°F | Ht 63.0 in | Wt 134.6 lb

## 2024-04-16 DIAGNOSIS — E538 Deficiency of other specified B group vitamins: Secondary | ICD-10-CM

## 2024-04-16 DIAGNOSIS — M545 Low back pain, unspecified: Secondary | ICD-10-CM | POA: Diagnosis not present

## 2024-04-16 DIAGNOSIS — Z7985 Long-term (current) use of injectable non-insulin antidiabetic drugs: Secondary | ICD-10-CM

## 2024-04-16 DIAGNOSIS — G8929 Other chronic pain: Secondary | ICD-10-CM

## 2024-04-16 DIAGNOSIS — I2510 Atherosclerotic heart disease of native coronary artery without angina pectoris: Secondary | ICD-10-CM | POA: Diagnosis not present

## 2024-04-16 DIAGNOSIS — I25118 Atherosclerotic heart disease of native coronary artery with other forms of angina pectoris: Secondary | ICD-10-CM

## 2024-04-16 DIAGNOSIS — Z23 Encounter for immunization: Secondary | ICD-10-CM

## 2024-04-16 DIAGNOSIS — E118 Type 2 diabetes mellitus with unspecified complications: Secondary | ICD-10-CM

## 2024-04-16 DIAGNOSIS — E785 Hyperlipidemia, unspecified: Secondary | ICD-10-CM

## 2024-04-16 DIAGNOSIS — E1169 Type 2 diabetes mellitus with other specified complication: Secondary | ICD-10-CM

## 2024-04-16 MED ORDER — REPATHA SURECLICK 140 MG/ML ~~LOC~~ SOAJ: 140.0000 mg | SUBCUTANEOUS | 3 refills | Status: AC

## 2024-04-16 NOTE — Assessment & Plan Note (Signed)
 Chronic low back pain may be progressing due to degenerative changes, though current symptoms do not suggest sciatic nerve involvement. A lumbar spine x-ray is ordered to assess progression. If significant progression is noted, consider referral to a back specialist. Recommend physical therapy if the x-ray shows no significant progression. Discussed using lidocaine  patches for pain relief.

## 2024-04-16 NOTE — Patient Instructions (Signed)
 We will check the x-ray of the back

## 2024-04-16 NOTE — Progress Notes (Signed)
   Subjective:   Patient ID: [name], female    DOB: 03/02/1961, 64 y.o.   MRN: [id number]  Discussed the use of AI scribe software for clinical note transcription with the patient, who gave verbal consent to proceed.  History of Present Illness [name] is a 63 year old female with degenerative disc disease who presents with worsening back pain.  She has been experiencing gradually worsening back pain over the past several months, primarily affecting the lower back and more often on the left side. Occasionally, the pain radiates across the lower back or to the right side. There is no specific incident that triggered the pain. The pain does not typically radiate down her legs, although it has in the past. The pain worsens with movement, particularly when standing or after sitting for a while, and it disrupts her sleep when she tries to turn at night. She takes Tylenol  Arthritis 8-hour for pain relief, which provides temporary relief. She has tried various topical treatments, including roll-on lidocaine , but not patches.  She describes her legs as feeling weak, which she speculates might be related to her use of Ozempic . She notes a slight decrease in muscle mass but not significantly. No recent leg numbness, but she reports leg weakness. Her back pain does not worsen significantly in the morning but is aggravated by movement.  She uses a Libre 3 sensor for glucose monitoring. She experienced nausea and vomiting after consuming rice and chicken, which she attributes to dietary restrictions.  She mentions having weak hands, particularly after a previous operation on one hand, and experiences frequent dropping of objects. She is on Repatha , which she needs refilled, and receives B12 injections due to Crohn's disease, which affects her nutrient absorption.  Review of Systems  Objective:  Physical Exam  Vitals:   04/16/24 1310  BP: 110/60  Pulse: 77  Temp: 98.8 F (37.1 C)  TempSrc:  Oral  SpO2: 97%  Weight: 134 lb 9.6 oz (61.1 kg)  Height: 5' 3 (1.6 m)   Flu shot given at visit, B12 given at visit  Assessment and Plan Assessment & Plan Low back pain and degenerative disc disease of the lumbar spine   Chronic low back pain may be progressing due to degenerative changes, though current symptoms do not suggest sciatic nerve involvement. A lumbar spine x-ray is ordered to assess progression. If significant progression is noted, consider referral to a back specialist. Recommend physical therapy if the x-ray shows no significant progression. Discussed using lidocaine  patches for pain relief.  Atherosclerotic heart disease of native coronary artery   Managed with Repatha , which requires a refill. The Repatha  prescription has been refilled.

## 2024-04-16 NOTE — Assessment & Plan Note (Signed)
 Libre monitoring in place and HgA1c estimated 7.4%. will defer lab HgA1c testing to next visit since this is stable.

## 2024-04-16 NOTE — Assessment & Plan Note (Signed)
 B12 shot given at visit and will continue monthly due to poor absorption.

## 2024-04-16 NOTE — Assessment & Plan Note (Signed)
 Managed with Repatha , which requires a refill. The Repatha  prescription has been refilled.

## 2024-04-20 MED ORDER — CYANOCOBALAMIN 1000 MCG/ML IJ SOLN
1000.0000 ug | Freq: Once | INTRAMUSCULAR | Status: AC
  Administered 2024-04-16: 1000 ug via INTRAMUSCULAR

## 2024-04-20 NOTE — Addendum Note (Signed)
 Addended by: EZZARD EDSEL HERO on: 04/20/2024 08:08 AM   Modules accepted: Orders

## 2024-04-23 ENCOUNTER — Other Ambulatory Visit: Payer: Self-pay | Admitting: Internal Medicine

## 2024-04-23 ENCOUNTER — Ambulatory Visit: Payer: Self-pay | Admitting: Internal Medicine

## 2024-04-23 NOTE — Telephone Encounter (Signed)
 Copied from CRM #[id number]. Topic: Clinical - Medication Refill >> Apr 23, 2024  1:43 PM [name] C wrote: Medication: Continuous Blood Gluc Sensor (FREESTYLE LIBRE 14 DAY SENSOR) MISC. LIBRE 3 PLUS.  Patient is down to one sensor. She is not sure if it's to be filled through the pharmacy or the company that sent them to her. She did say that she contacted them and they told her to contact her doctor to get the prescription in.  The company is [employer] in [place]  [phone]. Account # of patient: [id number]  [phone] ext [phone] is another number that patient spoke with [employer].   Has the patient contacted their pharmacy? Yes (Agent: If no, request that the patient contact the pharmacy for the refill. If patient does not wish to contact the pharmacy document the reason why and proceed with request.) (Agent: If yes, when and what did the pharmacy advise?)  This is the patient's preferred pharmacy:  [id number] [place], [place] - [address] [facility] [place] [address] Phone: [phone] Fax: [phone]  Is this the correct pharmacy for this prescription? Yes If no, delete pharmacy and type the correct one.   Has the prescription been filled recently? Yes  Is the patient out of the medication? Patient is down to 5-7 days left.   Has the patient been seen for an appointment in the last year OR does the patient have an upcoming appointment? Yes  Can we respond through MyChart? Yes  Agent: Please be advised that Rx refills may take up to 3 business days. We ask that you follow-up with your pharmacy.

## 2024-04-25 ENCOUNTER — Telehealth: Payer: Self-pay

## 2024-04-25 DIAGNOSIS — E118 Type 2 diabetes mellitus with unspecified complications: Secondary | ICD-10-CM

## 2024-04-25 MED ORDER — FREESTYLE LIBRE 14 DAY SENSOR MISC: 11 refills | Status: AC

## 2024-04-25 NOTE — Telephone Encounter (Signed)
 Copied from CRM #[id number]. Topic: Clinical - Medication Refill >> Apr 23, 2024  1:43 PM [name] C wrote: Medication: Continuous Blood Gluc Sensor (FREESTYLE LIBRE 14 DAY SENSOR) MISC. LIBRE 3 PLUS.  Patient is down to one sensor. She is not sure if it's to be filled through the pharmacy or the company that sent them to her.   Has the patient contacted their pharmacy? Yes (Agent: If no, request that the patient contact the pharmacy for the refill. If patient does not wish to contact the pharmacy document the reason why and proceed with request.) (Agent: If yes, when and what did the pharmacy advise?)  This is the patient's preferred pharmacy:  [id number] [place], [place] - [address] [facility] [place] [address] Phone: [phone] Fax: [phone]  Is this the correct pharmacy for this prescription? Yes If no, delete pharmacy and type the correct one.   Has the prescription been filled recently? Yes  Is the patient out of the medication? Patient is down to 5-7 days left.   Has the patient been seen for an appointment in the last year OR does the patient have an upcoming appointment? Yes  Can we respond through MyChart? Yes  Agent: Please be advised that Rx refills may take up to 3 business days. We ask that you follow-up with your pharmacy. >> Apr 25, 2024 11:14 AM China J wrote: The patient wants to know if Dr. Rollene could approve this before Thanksgiving since she is out of her sensors. She is on ozempic  and is wanting to be sure she has the ability to check her sugar.

## 2024-05-02 ENCOUNTER — Telehealth: Payer: Self-pay | Admitting: Pharmacist

## 2024-05-02 NOTE — Telephone Encounter (Signed)
 Patient called reporting inability to tolerate Ozempic  due to severe, persistent nausea and bloating despite appropriate dietary choices. Advised patient to discontinue Ozempic  and restart basal insulin  at 10-15 units nightly, with dose adjustments based on blood glucose readings.

## 2024-05-03 MED ORDER — FREESTYLE LIBRE 3 PLUS SENSOR MISC: 1 refills | Status: DC

## 2024-05-03 NOTE — Telephone Encounter (Signed)
Refill has been sent into the pharmacy 

## 2024-05-03 NOTE — Addendum Note (Signed)
 Addended by: EZZARD EDSEL HERO on: 05/03/2024 12:31 PM   Modules accepted: Orders

## 2024-05-08 ENCOUNTER — Other Ambulatory Visit: Payer: Self-pay

## 2024-05-08 ENCOUNTER — Telehealth: Payer: Self-pay

## 2024-05-08 MED ORDER — SKYRIZI 360 MG/2.4ML ~~LOC~~ SOCT: SUBCUTANEOUS | 7 refills | Status: AC

## 2024-05-08 NOTE — Telephone Encounter (Signed)
 Copied from CRM #[id number]. Topic: Clinical - Prescription Issue >> May 08, 2024 10:43 AM Drema MATSU wrote: Reason for CRM: Patient stated that she is needing Libre 3 Plus needs to go to [employer] .She stated that the wrong one was sent to  Pharmacy and is to expensive. Patient stated that she gave this information to us  already and we should have it.  Order # [id number] Ph# [phone] customer service [phone] people that does the orders

## 2024-05-09 NOTE — Telephone Encounter (Signed)
 Spoke with pt on 12/9, stated that rx for Frees Style Herlene was sent to the wrong pharmacy. Confirmed with pt per previous message [facility]. Pt stated that she gets it for free from [facility] and it was suppose to go there.     Spoke with Elain, pharmacist at [employer] [employer]. Called in [facility] 3 #6 (90 day supply) with 1RF. Called to notify pt however, mailbox is full and cannot take any messages at this time.  [facility] and [facility].

## 2024-05-14 ENCOUNTER — Ambulatory Visit

## 2024-05-14 ENCOUNTER — Telehealth: Payer: Self-pay

## 2024-05-14 NOTE — Telephone Encounter (Signed)
 Copied from CRM #[id number]. Topic: Clinical - Medication Question >> May 11, 2024  4:52 PM Shanda MATSU wrote: Reason for CRM: patient called in to see if sensors for [facility] 3 were sent to [facility], adv patient of the following info:  Spoke with pt on 12/9, stated that rx for Frees Style Herlene was sent to the wrong pharmacy. Confirmed with pt per previous message [facility]. Pt stated that she gets it for free from [facility] and it was suppose to go there.      Spoke with Elain, pharmacist at [employer] [employer]. Called in [facility] 3 #6 (90 day supply) with 1RF. Called to notify pt however, mailbox is full and cannot take any messages at this time.   [facility].   Patient is wanting to know if more than one refill can be sent as she does not want to keep calling provider's office req for a refill to be sent, patient is req a call back in regards to this.

## 2024-05-15 ENCOUNTER — Other Ambulatory Visit: Payer: Self-pay | Admitting: Internal Medicine

## 2024-05-15 ENCOUNTER — Ambulatory Visit: Payer: Self-pay | Admitting: Internal Medicine

## 2024-05-15 DIAGNOSIS — E118 Type 2 diabetes mellitus with unspecified complications: Secondary | ICD-10-CM

## 2024-05-15 NOTE — Telephone Encounter (Signed)
 Copied from CRM #[id number]. Topic: Clinical - Medication Refill >> May 15, 2024  9:16 AM Darshell M wrote: Medication:   Continuous Glucose Sensor (FREESTYLE LIBRE 3 PLUS SENSOR) MISC  - patient would like a 3 month supply.    Has the patient contacted their pharmacy? Yes (Agent: If no, request that the patient contact the pharmacy for the refill. If patient does not wish to contact the pharmacy document the reason why and proceed with request.) (Agent: If yes, when and what did the pharmacy advise?)  This is the patient's preferred pharmacy:  [id number] [place], [place] - [address] [facility]  [address] Phone: [phone] Fax: [phone]   Is this the correct pharmacy for this prescription? Yes If no, delete pharmacy and type the correct one.   Has the prescription been filled recently? No  Is the patient out of the medication? Yes   Has the patient been seen for an appointment in the last year OR does the patient have an upcoming appointment? Yes  Can we respond through MyChart? Yes  Agent: Please be advised that Rx refills may take up to 3 business days. We ask that you follow-up with your pharmacy.

## 2024-05-15 NOTE — Telephone Encounter (Signed)
 FYI Only or Action Required?: Action required by provider: medication refill request.  Patient was last seen in primary care on 04/16/2024 by Rollene Almarie LABOR, MD.  Called Nurse Triage reporting Medication Refill.  Symptoms began n/a.  Interventions attempted: Other: n/a.  Symptoms are: n/a.  Triage Disposition: Call PCP Now  Patient/caregiver understands and will follow disposition?: Yes   Copied from CRM #[id number]. Topic: Clinical - Red Word Triage >> May 15, 2024  2:36 PM Deleta RAMAN wrote: Red Word that prompted transfer to Nurse Triage: blood sugar is extremely high patient needs libre sensor Reason for Disposition  [1] Prescription refill request for ESSENTIAL medicine (i.e., likelihood of harm to patient if not taken) AND [2] triager unable to refill per department policy  Answer Assessment - Initial Assessment Questions Pt states she is completely out. Day before she was out her BS went upt to 345 and as low as 70. She needs the sensor to wake her up at night. She states she has been back and forth. She states she calle [facility] and they stated her account is closed. [facility] so [facility].  Pt denies any symptoms currently. States she has a headache currently but denies any dizziness, lightheadedness, chest pain or shortness of breath.     1. DRUG NAME: What medicine do you need to have refilled?     Libre 3 sensor 2. REFILLS REMAINING: How many refills are remaining? Notes: The label on the medicine or pill bottle will show how many refills are remaining. If there are no refills remaining, then a renewal may be needed.     none  4. PRESCRIBER: Who prescribed it? Note: The prescribing doctor or group is responsible for refill approvals..     Dr, crawford 5. PHARMACY: Have you contacted your pharmacy (drugstore)? Note: Some pharmacies will contact the doctor (or NP/PA).      [facility] [place] 6. SYMPTOMS: Do you  have any symptoms?     None currently  Protocols used: Medication Refill and Renewal Call-A-AH

## 2024-05-17 ENCOUNTER — Other Ambulatory Visit: Payer: Self-pay

## 2024-05-17 DIAGNOSIS — E118 Type 2 diabetes mellitus with unspecified complications: Secondary | ICD-10-CM

## 2024-05-17 MED ORDER — FREESTYLE LIBRE 3 PLUS SENSOR MISC: 1 refills | Status: AC

## 2024-05-17 NOTE — Telephone Encounter (Signed)
 See previous note

## 2024-05-17 NOTE — Telephone Encounter (Signed)
 [facility] and they informed me that pt cancelled the account. Rx for Free Style Libre Plus 3 sensors #6 (90 day supply) with 1RF [facility] [place]   Called to notify pt however, mailbox is full and unable to leave a message. Message has also been sent via MyChart.

## 2024-05-21 ENCOUNTER — Ambulatory Visit

## 2024-05-21 DIAGNOSIS — K5000 Crohn's disease of small intestine without complications: Secondary | ICD-10-CM

## 2024-05-21 DIAGNOSIS — E538 Deficiency of other specified B group vitamins: Secondary | ICD-10-CM

## 2024-06-02 ENCOUNTER — Other Ambulatory Visit: Payer: Self-pay | Admitting: Internal Medicine

## 2024-06-07 NOTE — Telephone Encounter (Signed)
 Patient called to verify the pharmacy, no answer, mailbox is full.  Copied from CRM [id number]. Topic: Clinical - Prescription Issue >> Jun 07, 2024  9:34 AM Darshell M wrote: Reason for CRM: Patient is out of insulin  and needs prescription refilled. Patient CB# [phone]

## 2024-06-25 ENCOUNTER — Telehealth: Payer: Self-pay | Admitting: *Deleted

## 2024-06-25 NOTE — Transitions of Care (Post Inpatient/ED Visit) (Signed)
 "  06/25/2024  Name: [name] MRN: [id number] DOB: 1961-05-19  Today's TOC FU Call Status: Today's TOC FU Call Status:: Successful TOC FU Call Completed TOC FU Call Complete Date: 06/25/24  Patient's Name and Date of Birth confirmed. Name, DOB  Transition Care Management Follow-up Telephone Call Date of Discharge: 06/23/24 Discharge Facility: Other ([facility]) Name of Other ([facility]) Discharge Facility: [facility] Type of Discharge: Inpatient Admission Primary Inpatient Discharge Diagnosis:: C-Diff colitis How have you been since you were released from the hospital?: Same (I still just don't feel good; they told me to expect that I would not feel good for awhile; taking the antibiotics and the pro-biotics like they told me to; I don not need any more calls, I know what to do and am doing it) Any questions or concerns?: No  Items Reviewed: Did you receive and understand the discharge instructions provided?: Yes (briefly reviewed with patient who verbalizes good understanding of same - outside hospital AVS) Medications obtained,verified, and reconciled?: Partial Review Completed (Partial medication reconciliation/ review completed; confirmed  per patient: obtained/ is taking all newly Rx'd medications as instructed; self-manages medications and denies questions/ concerns around medications today) Reason for Partial Mediation Review: Patient declined full review: I am not up to reviewing all the medications with you- I am feeling nauseous and do not need to review all of the medicines with you Any new allergies since your discharge?: No Dietary orders reviewed?: Yes Type of Diet Ordered:: Regular but I am not feeling like eating much because of this C-Diff Do you have support at home?: Yes People in Home [RPT]: spouse Name of Support/Comfort Primary Source: Reports independent in self-care activities; respides with supportive spouse- assists as/ if needed/  indicated  Medications Reviewed Today: Medications Reviewed Today     Reviewed by Trevell Pariseau M, RN (Registered Nurse) on 06/25/24 at 1205  Med List Status: <None>   Medication Order Taking? Sig Documenting Provider Last Dose Status Informant  acetaminophen  (TYLENOL ) 650 MG CR tablet [id number]  Take 650 mg by mouth every 8 (eight) hours as needed for pain. [provider]  Active Self  albuterol  (PROVENTIL ) (2.5 MG/3ML) 0.083% nebulizer solution [id number]  Take 2.5 mg by nebulization every 4 (four) hours as needed for wheezing or shortness of breath.  [provider]  Active Self           Med Note ALLEGRA, [facility] I   Thu Apr 17, 2020  8:14 PM)    albuterol  (VENTOLIN  HFA) 108 [phone] Base) MCG/ACT inhaler [id number]  Inhale 1-2 puffs into the lungs every 6 (six) hours as needed for wheezing or shortness of breath. [provider]  Active Self  aspirin  EC 81 MG tablet [id number]  Take 81 mg by mouth daily. Swallow whole. [provider]  Active Self  Blood Glucose Monitoring Suppl (FREESTYLE LITE) w/Device KIT [id number]  Use to check blood sugar up to 4 times a day as directed Rollene Almarie LABOR, MD  Active   budesonide  (ENTOCORT EC ) 3 MG 24 hr capsule [id number]  Take 3 capsules (9 mg total) by mouth daily. Nandigam, Kavitha V, MD  Active   colestipol  (COLESTID ) 1 g tablet [id number]  TAKE 1 TABLET (1 G TOTAL) BY MOUTH 3 (THREE) TIMES DAILY WITH MEALS. DO NOT TAKE WITHIN 2 HOURS OF ANY OTHER MEDICATIONS Nandigam, Kavitha V, MD  Active   Continuous Blood Gluc Sensor (FREESTYLE LIBRE SENSOR SYSTEM) MISC [id number]  Use for blood sugar monitoring Rollene Almarie LABOR,  MD  Active   Continuous Glucose Sensor (FREESTYLE LIBRE 14 [place]) [place] [id number]  Use to monitor sugars Rollene Almarie LABOR, MD  Active   Continuous Glucose Sensor (FREESTYLE LIBRE 3 PLUS SENSOR) [place] [id number]  Change sensor every 15 days. Rollene Almarie LABOR, MD  Active   cyanocobalamin   (VITAMIN B12) 1000 MCG/ML injection [id number]  INJECT 1ML INTO SKIN EVERY 14 DAYS X 3 MONTHS THEN MONTHLY Nandigam, Kavitha V, MD  Active   cyanocobalamin  (VITAMIN B12) injection 1,000 mcg [id number]   Nandigam, Kavitha V, MD  Active   dicyclomine  (BENTYL ) 20 MG tablet [id number]  Take 1 tablet (20 mg total) by mouth every 6 (six) hours.  Patient not taking: Reported on 04/16/2024   Avram Lupita BRAVO, MD  Active   DULoxetine  (CYMBALTA ) 60 MG capsule [id number]  Take 1 capsule (60 mg total) by mouth daily. Rollene Almarie LABOR, MD  Active   enalapril  (VASOTEC ) 10 MG tablet [id number]  Take 1 tablet (10 mg) by mouth daily. Michele Richardson, DO  Active     Discontinued 07/23/20 [phone] (Reorder)          Med Note [facility], CHASITIE R   Fri Mar 19, 2016 11:24 AM)    ergocalciferol  (VITAMIN D2) 1.25 MG (50000 UT) capsule [id number]  Take 1 capsule (50,000 Units total) by mouth once a week. Rollene Almarie LABOR, MD  Active   Evolocumab  (REPATHA  SURECLICK) 140 MG/ML EMMANUEL [id number]  Inject 140 mg into the skin every 14 (fourteen) days. Rollene Almarie LABOR, MD  Active   fenofibrate  (TRICOR ) 145 MG tablet [id number]  TAKE 1 TABLET BY MOUTH EVERY DAY Tolia, Sunit, DO  Active   furosemide  (LASIX ) 40 MG tablet [id number]  Take 1 tablet (40 mg total) by mouth daily as needed for fluid or edema.  Patient not taking: Reported on 04/16/2024   Hobart Powell BRAVO, MD  Active   glimepiride  (AMARYL ) 4 MG tablet [id number]  Take 1 tablet (4 mg total) by mouth daily before breakfast. Rollene Almarie LABOR, MD  Active   glucose blood [facility] ULTRA) test strip [id number]  USE AS DIRECTED Rollene Almarie LABOR, MD  Active   insulin  glargine (LANTUS  SOLOSTAR) 100 UNIT/ML Solostar Pen [id number]  INJECT 58 UNITS UNDER SKIN DAILY Rollene Almarie LABOR, MD  Active   Insulin  Pen Needle (PEN NEEDLES) 31G X 5 MM MISC [id number]  1 each by Does not apply route daily.  Patient not taking: Reported on 04/16/2024   Elnor Lauraine BRAVO, NP  Active    Lancets (FREESTYLE) lancets [id number]  Use to check blood sugar up to 4 times a day as directed Rollene Almarie LABOR, MD  Active   levocetirizine (XYZAL ) 5 MG tablet [id number]  Take 1 tablet  by mouth every evening. Rollene Almarie LABOR, MD  Active   loperamide  (IMODIUM ) 2 MG capsule [id number]  Take 1 capsule (2 mg total) by mouth 4 (four) times daily as needed for diarrhea or loose stools. Randol Simmonds, MD  Active   metFORMIN  (GLUCOPHAGE -XR) 500 MG 24 hr tablet [id number]  Take 2 tablets by mouth daily. Rollene Almarie LABOR, MD  Active   metoprolol  succinate (TOPROL -XL) 25 MG 24 hr tablet [id number]  Take 1.5 tablets (37.5 mg total) by mouth daily. Tolia, Sunit, DO  Active   montelukast  (SINGULAIR ) 10 MG tablet [id number]  Take 1 tablet (10 mg total) by mouth every evening. Rollene Almarie LABOR, MD  Active   Multiple Vitamins-Minerals (MULTIVITAMIN WITH MINERALS) tablet [id number]  Take 1 tablet  by mouth daily. [provider]  Active Self  nitroGLYCERIN  (NITROSTAT ) 0.4 MG SL tablet [id number]  Place 1 tablet (0.4 mg total) under the tongue every 5 (five) minutes as needed for chest pain.  Call 911 if no relief after 1st dose. Michele, Sunit, DO  Active   ondansetron  (ZOFRAN  ODT) 4 MG disintegrating tablet [id number] Yes Take 1 tablet (4 mg total) by mouth every 4 (four) hours as needed for nausea or vomiting. Armenta Canning, MD  Active Self  pantoprazole  (PROTONIX ) 40 MG tablet [id number]  Take 1 tablet (40 mg total) by mouth daily. Nandigam, Kavitha V, MD  Active   Probiotic Product (PROBIOTIC DAILY PO) [id number] Yes Take 1 capsule by mouth daily. [provider]  Active Self  SKYRIZI  360 MG/2.4ML SOCT [id number]  360 mg SQ injection by cartridge and repeat every 8 weeks Nandigam, Kavitha V, MD  Active   sodium chloride  (OCEAN) 0.65 % SOLN nasal spray [id number]  Place 1 spray into both nostrils daily as needed for congestion. [provider]  Active Self  sucralfate  (CARAFATE ) 1  g tablet [id number]  Take 1 tablet (1 g total) by mouth 4 (four) times daily -  with meals and at bedtime. Nandigam, Kavitha V, MD  Active   vancomycin  (VANCOCIN ) 125 MG capsule [id number] Yes Take 125 mg by mouth 4 (four) times daily. [provider]  Active   VASCEPA  1 g capsule [id number]  Take 2 capsules by mouth 2 times daily. Tolia, Sunit, DO  Active   Vitamin D3 (VITAMIN D ) 25 MCG tablet [id number]  Take 1,000 Units by mouth at bedtime. [provider]  Active Self           Home Care and Equipment/Supplies: Were Home Health Services Ordered?: No Any new equipment or medical supplies ordered?: No  Functional Questionnaire: Do you need assistance with bathing/showering or dressing?: No Do you need assistance with meal preparation?: No Do you need assistance with eating?: No Do you have difficulty maintaining continence: No Do you need assistance with getting out of bed/getting out of a chair/moving?: No Do you have difficulty managing or taking your medications?: No  Follow up appointments reviewed: PCP Follow-up appointment confirmed?: No [facility] follow up PCP appointment successfully scheduled by [facility] RN CM in real-time 07/09/24) MD Provider Line Number:[phone] Given:  (verified well-established with current PCP) Specialist Hospital Follow-up appointment confirmed?: No (GI specialist) Reason Specialist Follow-Up Not Confirmed: Patient has Specialist Provider Number and will Call for Appointment Do you need transportation to your follow-up appointment?: No Do you understand care options if your condition(s) worsen?: Yes-patient verbalized understanding  SDOH Interventions Today    Flowsheet Row Most Recent Value  SDOH Interventions   Food Insecurity Interventions Intervention Not Indicated  Housing Interventions Intervention Not Indicated  Transportation Interventions Intervention Not Indicated  [drives self at baseline,  husband assists as- if needed/  indicated]  Utilities Interventions Intervention Not Indicated   See TOC assessment tabs for additional assessment/ TOC intervention information Provided education around benefit of conservative post-hospital discharge activity; need to pace activity without over-doing  Provided education around expected/ usual side effects of antibiotics  Provided education around need to stay hydrated during acute illness; need to ensure frequent blood sugar monitoring during periods of decreased appetite- nausea  Patient declines need for ongoing/ further care management/ coordination outreach; declines enrollment in 30-day TOC program- declines taking my direct phone number should needs/ concerns arise post-TOC call   Pls call/ message for questions,  Ted Goodner Mckinney Telesia Ates, RN, BSN, Media Planner  Transitions of Care  [facility] - [facility] [phone]: direct office  "

## 2024-06-26 ENCOUNTER — Other Ambulatory Visit: Payer: Self-pay | Admitting: Internal Medicine

## 2024-06-26 ENCOUNTER — Other Ambulatory Visit: Payer: Self-pay | Admitting: Gastroenterology

## 2024-06-27 ENCOUNTER — Telehealth: Payer: Self-pay

## 2024-06-27 NOTE — Telephone Encounter (Signed)
 Patient notified me that she had been seen at the [facility] and diagnosed with C Difficile infection. She is on antibiotics for 10 days.  She is also complaining of UTI symptoms. Encouraged the patient to go to an urgent care for diagnosis and treatment. Patient reports back that she did this and has been given nausea medication and  nitrofurantoin. Follow up appointment scheduled for the patient with Dr Nandigam.

## 2024-06-28 ENCOUNTER — Ambulatory Visit: Admitting: Gastroenterology

## 2024-06-29 ENCOUNTER — Telehealth: Payer: Self-pay | Admitting: Internal Medicine

## 2024-06-29 DIAGNOSIS — A0472 Enterocolitis due to Clostridium difficile, not specified as recurrent: Secondary | ICD-10-CM

## 2024-06-29 MED ORDER — VANCOMYCIN HCL 125 MG PO CAPS: 125.0000 mg | ORAL_CAPSULE | Freq: Four times a day (QID) | ORAL | 0 refills | Status: AC

## 2024-07-02 NOTE — Telephone Encounter (Signed)
 Spoke with the patient. She is feeling better and is following the instructions on her bottle of antibiotics. Bowel movements are about 4 times a day, lots of gas.   Encourage hydration. Appointment 07/06/24.

## 2024-07-04 ENCOUNTER — Other Ambulatory Visit: Payer: Self-pay | Admitting: Internal Medicine

## 2024-07-04 ENCOUNTER — Other Ambulatory Visit: Payer: Self-pay | Admitting: Gastroenterology

## 2024-07-04 DIAGNOSIS — A0472 Enterocolitis due to Clostridium difficile, not specified as recurrent: Secondary | ICD-10-CM

## 2024-07-04 MED ORDER — VANCOMYCIN HCL 125 MG PO CAPS: 125.0000 mg | ORAL_CAPSULE | Freq: Four times a day (QID) | ORAL | 0 refills | Status: DC

## 2024-07-04 NOTE — Progress Notes (Signed)
 On call  Patient called Apparently she was told that she would need 2 more days of vancomycin  until she comes for follow-up visit She went to pharmacy and did not find the prescription.  Still having diarrhea. Wanted me to call in 2 more days of vancomycin  125 4 times daily until follow-up on 07/06/2024  I have called in vancomycin  125 mg p.o. 4 times daily x 2 days #8 If she needs to carry on with vancomycin , that will be determined at follow-up visit.  RG

## 2024-07-04 NOTE — Telephone Encounter (Signed)
 Spoke with patient.  She will pick up the additional prescription for Vancomycin  that Dr. Federico sent.  She has and appointment for an office follow-up visit on Friday 07/06/2024.

## 2024-07-05 ENCOUNTER — Other Ambulatory Visit: Payer: Self-pay

## 2024-07-06 ENCOUNTER — Telehealth: Payer: Self-pay | Admitting: Cardiology

## 2024-07-06 ENCOUNTER — Other Ambulatory Visit

## 2024-07-06 ENCOUNTER — Ambulatory Visit: Admitting: Gastroenterology

## 2024-07-06 ENCOUNTER — Encounter: Payer: Self-pay | Admitting: Gastroenterology

## 2024-07-06 VITALS — BP 132/72 | HR 69 | Ht 63.0 in | Wt 134.0 lb

## 2024-07-06 DIAGNOSIS — D500 Iron deficiency anemia secondary to blood loss (chronic): Secondary | ICD-10-CM

## 2024-07-06 DIAGNOSIS — K5080 Crohn's disease of both small and large intestine without complications: Secondary | ICD-10-CM

## 2024-07-06 DIAGNOSIS — E559 Vitamin D deficiency, unspecified: Secondary | ICD-10-CM

## 2024-07-06 DIAGNOSIS — E538 Deficiency of other specified B group vitamins: Secondary | ICD-10-CM

## 2024-07-06 DIAGNOSIS — A0472 Enterocolitis due to Clostridium difficile, not specified as recurrent: Secondary | ICD-10-CM

## 2024-07-06 LAB — CBC WITH DIFFERENTIAL/PLATELET
Basophils Absolute: 0.1 10*3/uL (ref 0.0–0.1)
Basophils Relative: 0.9 % (ref 0.0–3.0)
Eosinophils Absolute: 0.1 10*3/uL (ref 0.0–0.7)
Eosinophils Relative: 1.8 % (ref 0.0–5.0)
HCT: 40.8 % (ref 36.0–46.0)
Hemoglobin: 13.8 g/dL (ref 12.0–15.0)
Lymphocytes Relative: 37 % (ref 12.0–46.0)
Lymphs Abs: 2.9 10*3/uL (ref 0.7–4.0)
MCHC: 33.8 g/dL (ref 30.0–36.0)
MCV: 89.1 fl (ref 78.0–100.0)
Monocytes Absolute: 0.7 10*3/uL (ref 0.1–1.0)
Monocytes Relative: 8.8 % (ref 3.0–12.0)
Neutro Abs: 4 10*3/uL (ref 1.4–7.7)
Neutrophils Relative %: 51.5 % (ref 43.0–77.0)
Platelets: 435 10*3/uL — ABNORMAL HIGH (ref 150.0–400.0)
RBC: 4.58 Mil/uL (ref 3.87–5.11)
RDW: 13.5 % (ref 11.5–15.5)
WBC: 7.9 10*3/uL (ref 4.0–10.5)

## 2024-07-06 LAB — COMPREHENSIVE METABOLIC PANEL WITH GFR
ALT: 22 U/L (ref 3–35)
AST: 21 U/L (ref 5–37)
Albumin: 4.5 g/dL (ref 3.5–5.2)
Alkaline Phosphatase: 108 U/L (ref 39–117)
BUN: 15 mg/dL (ref 6–23)
CO2: 27 meq/L (ref 19–32)
Calcium: 9.8 mg/dL (ref 8.4–10.5)
Chloride: 104 meq/L (ref 96–112)
Creatinine, Ser: 0.5 mg/dL (ref 0.40–1.20)
GFR: 97.98 mL/min
Glucose, Bld: 125 mg/dL — ABNORMAL HIGH (ref 70–99)
Potassium: 4.7 meq/L (ref 3.5–5.1)
Sodium: 139 meq/L (ref 135–145)
Total Bilirubin: 0.4 mg/dL (ref 0.2–1.2)
Total Protein: 7.1 g/dL (ref 6.0–8.3)

## 2024-07-06 LAB — IBC + FERRITIN
Ferritin: 19.7 ng/mL (ref 10.0–291.0)
Iron: 72 ug/dL (ref 42–145)
Saturation Ratios: 13.4 % — ABNORMAL LOW (ref 20.0–50.0)
TIBC: 536.2 ug/dL — ABNORMAL HIGH (ref 250.0–450.0)
Transferrin: 383 mg/dL — ABNORMAL HIGH (ref 212.0–360.0)

## 2024-07-06 LAB — VITAMIN D 25 HYDROXY (VIT D DEFICIENCY, FRACTURES): VITD: 36.13 ng/mL (ref 30.00–100.00)

## 2024-07-06 LAB — VITAMIN B12: Vitamin B-12: 697 pg/mL (ref 211–911)

## 2024-07-06 LAB — HIGH SENSITIVITY CRP: CRP, High Sensitivity: 2.99 mg/L (ref 0.200–5.000)

## 2024-07-06 MED ORDER — VANCOMYCIN HCL 125 MG PO CAPS: 125.0000 mg | ORAL_CAPSULE | Freq: Four times a day (QID) | ORAL | 0 refills | Status: AC

## 2024-07-06 NOTE — Progress Notes (Signed)
 "                [name]    [id number]    1960/10/27  Primary Care Physician:Crawford, Almarie LABOR, MD  Referring Physician: Rollene Almarie LABOR, MD [address] [place],  [place] [address]   Chief complaint: C. difficile diarrhea, Crohn's  Discussed the use of AI scribe software for clinical note transcription with the patient, who gave verbal consent to proceed.  History of Present Illness [name] is a 64 year old female with Crohn's disease, recurrent Clostridioides difficile infection who presents for follow-up of persistent diarrhea.  Diarrhea and Gastrointestinal Symptoms: - Persistent diarrhea with four to five loose bowel movements per day - Ongoing symptoms despite current vancomycin  course (prescribed for only two days; four pills remain) - Fatigue and soreness from frequent bathroom trips; occasionally delays bowel movements due to discomfort - Prior episode of severe vomiting and diarrhea required hospitalization, attributed to C. difficile infection - Goal to reduce bowel movement frequency to one or two per day - Intermittent abdominal pain and diarrhea occurred about twice a month prior to current episode, improved since starting Skyrizi  for Crohn's disease - Irritable bowel syndrome occasionally exacerbates gastrointestinal symptoms - Awareness of need to avoid unnecessary antibiotics to prevent recurrence  Urinary Symptoms: - Recent five-day course of antibiotics completed for urinary tract infection - Intermittent urinary discomfort persists, described as hurting or burning, though not with every urination - Follow-up for urinary infection resolution scheduled with primary care provider next week  Arthralgia and Systemic Symptoms: - History of arthritis with significant pain in the hands and generalized discomfort - Frequent hot sweats and difficulty sleeping, with sleep disruption and stress attributed in part to caring for a young child during  adoption process  Lymphadenopathy and Pancreatic Evaluation: - Previously enlarged lymph nodes were biopsied and found negative for infection or malignancy, with spontaneous resolution     Relevant Hx: Colonoscopy September 02, 2023 - Simple Endoscopic Score for Crohn' s Disease: 1, mucosal inflammatory changes secondary to Crohn' s disease, in remission. Biopsied. - Ileal diverticula. - Pseudopolyps in the terminal ileum. Biopsied.  1. Surgical [P], small bowel, terminal ileum :       SMALL INTESTINAL MUCOSA WITHOUT SIGNIFICANT DIAGNOSTIC ALTERATION.       NEGATIVE FOR ACTIVITY, CHRONICITY, GRANULOMA, DYSPLASIA OR MALIGNANCY.        2. Surgical [P], random colon :       COLONIC MUCOSA WITH FOCAL MILD CRYPT ARCHITECTURE DISARRAY AND HYPERPLASTIC       CHANGES.       NEGATIVE FOR ACTIVITY, CHRONICITY, GRANULOMA, DYSPLASIA OR MALIGNANCY.    LYMPH NODE, BIOPSY:  Benign reactive lymph node with focal epithelioid histiocytic  aggregates.  No fungal elements identified on GMS stain.  No acid fast microorganisms identified on AFB stain.  Negative for lymphoproliferative disorder or malignancy.     CT abd & pelvis 06/08/2023 1. Prominent/mildly enlarged left iliac side chain, pelvic sidewall and inguinal lymph nodes, nonspecific, possibly reactive versus lymphoproliferative process. Consider correlation with laboratory values follow-up CT abdomen pelvis in 3 months to assess for stability or resolution. A more aggressive approach would include ultrasound-guided fine-needle aspiration of the dominant left inguinal lymph node. 2. Diffuse hepatic steatosis. 3. Scattered colonic diverticulosis without findings of acute diverticulitis. 4.  Aortic Atherosclerosis (ICD10-I70.0).   Colonoscopy 09/23/21: - One 5 mm polyp in the ascending colon, removed with a cold snare. Resected and retrieved. - Simple Endoscopic  Score for Crohn's Disease: 4, mucosal inflammatory changes secondary to Crohn's disease  with ileitis. Biopsied. - Diverticulosis in the sigmoid colon. - Non-bleeding external and internal hemorrhoids. 2. Surgical [P], colon, ascending, polyp (1) TUBULAR ADENOMA NEGATIVE FOR HIGH-GRADE DYSPLASIA AND CARCINOMA   Endoscopy 09/23/21: - The Z-line was regular and was found 35 cm from the incisors. - A small hiatal hernia was present. - The stomach was normal. - The cardia and gastric fundus were normal on retroflexion. - The examined duodenum was normal. Pathology 09/23/21: 1. Surgical [P], small bowel, terminal ileum MINIMAL ACUTE ILEITIS (SEE MICROSCOPIC COMMENT) CRP elevated to 55,000 in May 2022 and was persistently elevated on Entyvio  and budesonide    Colonoscopy December 05, 2018: Terminal ileal biopsies consistent with Crohn's disease.  IC valve polypoid lesion status post EMR, biopsies consistent with IBD negative for dysplasia or adenomatous tissue 5 mm polyp, tubular adenoma removed from transverse colon otherwise normal colon on chromoendoscopy   Colonoscopy July 07, 2018 Nodular TI biopsied Localized nodular polypoid mucosa in IC valve biopsied, ?  Adenoma on pathology report, requested additional review is pending.   EGD 08/22/2009: Esophagitis otherwise unremarkable exam      MRI pelvis February 14, 2018 showed grade 1 simple linear intersphincteric perianal fistula without associated abscess.  Mild segmental wall thickening and luminal narrowing in the distal ileum suggestive of mild active Crohn's ileitis.  Mild chronic wall thickening in the terminal ileum without active inflammatory changes.     Crohn's disease with predominant small bowel involvement diagnosed in 1991.  Initially was managed with prednisone , had significant side effects.  Subsequently was in clinical remission on 6-MP.  6-MP was discontinued in 2017 due to persistently elevated transaminases, have improved after discontinuing 6-MP.  She was started on Humira , clinically failed with persistent  symptoms.  Had undetectable drug trough with elevated antibodies.  Started on budesonide  9 mg daily and taper down to 3 mg daily in 2018.  Developed rectal pain and noted to have perianal fistula on MRI pelvis September 2019.   Relevant other PMH She had NSTEMI in November 2018, status post coronary stent placement (DES)  Outpatient Encounter Medications as of 07/06/2024  Medication Sig   acetaminophen  (TYLENOL ) 650 MG CR tablet Take 650 mg by mouth every 8 (eight) hours as needed for pain.   albuterol  (PROVENTIL ) (2.5 MG/3ML) 0.083% nebulizer solution Take 2.5 mg by nebulization every 4 (four) hours as needed for wheezing or shortness of breath.    albuterol  (VENTOLIN  HFA) 108 (90 Base) MCG/ACT inhaler Inhale 1-2 puffs into the lungs every 6 (six) hours as needed for wheezing or shortness of breath.   aspirin  EC 81 MG tablet Take 81 mg by mouth daily. Swallow whole.   Blood Glucose Monitoring Suppl (FREESTYLE LITE) w/Device KIT Use to check blood sugar up to 4 times a day as directed   budesonide  (ENTOCORT EC ) 3 MG 24 hr capsule Take 3 capsules (9 mg total) by mouth daily.   colestipol  (COLESTID ) 1 g tablet TAKE 1 TABLET (1 G TOTAL) BY MOUTH 3 (THREE) TIMES DAILY WITH MEALS. DO NOT TAKE WITHIN 2 HOURS OF ANY OTHER MEDICATIONS   Continuous Blood Gluc Sensor (FREESTYLE LIBRE SENSOR SYSTEM) MISC Use for blood sugar monitoring   Continuous Glucose Sensor (FREESTYLE LIBRE 14 DAY SENSOR) MISC Use to monitor sugars   Continuous Glucose Sensor (FREESTYLE LIBRE 3 PLUS SENSOR) MISC Change sensor every 15 days.   cyanocobalamin  (VITAMIN B12) 1000 MCG/ML injection INJECT 1ML INTO SKIN  EVERY 14 DAYS X 3 MONTHS THEN MONTHLY   dicyclomine  (BENTYL ) 20 MG tablet Take 1 tablet (20 mg total) by mouth every 6 (six) hours.   DULoxetine  (CYMBALTA ) 60 MG capsule Take 1 capsule (60 mg total) by mouth daily.   enalapril  (VASOTEC ) 10 MG tablet Take 1 tablet (10 mg) by mouth daily.   ergocalciferol  (VITAMIN D2) 1.25 MG  (50000 UT) capsule Take 1 capsule (50,000 Units total) by mouth once a week.   Evolocumab  (REPATHA  SURECLICK) 140 MG/ML SOAJ Inject 140 mg into the skin every 14 (fourteen) days.   fenofibrate  (TRICOR ) 145 MG tablet TAKE 1 TABLET BY MOUTH EVERY DAY   furosemide  (LASIX ) 40 MG tablet Take 1 tablet (40 mg total) by mouth daily as needed for fluid or edema.   glimepiride  (AMARYL ) 4 MG tablet Take 1 tablet (4 mg total) by mouth daily before breakfast.   glucose blood (ONETOUCH ULTRA) test strip USE AS DIRECTED   insulin  glargine (LANTUS  SOLOSTAR) 100 UNIT/ML Solostar Pen INJECT 58 UNITS UNDER SKIN DAILY   Insulin  Pen Needle (PEN NEEDLES) 31G X 5 MM MISC 1 each by Does not apply route daily.   Lancets (FREESTYLE) lancets Use to check blood sugar up to 4 times a day as directed   levocetirizine (XYZAL ) 5 MG tablet Take 1 tablet  by mouth every evening.   loperamide  (IMODIUM ) 2 MG capsule Take 1 capsule (2 mg total) by mouth 4 (four) times daily as needed for diarrhea or loose stools.   metFORMIN  (GLUCOPHAGE -XR) 500 MG 24 hr tablet TAKE 2 TABLETS BY MOUTH EVERY DAY   metoprolol  succinate (TOPROL -XL) 25 MG 24 hr tablet Take 1.5 tablets (37.5 mg total) by mouth daily.   montelukast  (SINGULAIR ) 10 MG tablet Take 1 tablet (10 mg total) by mouth every evening.   Multiple Vitamins-Minerals (MULTIVITAMIN WITH MINERALS) tablet Take 1 tablet by mouth daily.   nitroGLYCERIN  (NITROSTAT ) 0.4 MG SL tablet Place 1 tablet (0.4 mg total) under the tongue every 5 (five) minutes as needed for chest pain.  Call 911 if no relief after 1st dose.   ondansetron  (ZOFRAN  ODT) 4 MG disintegrating tablet Take 1 tablet (4 mg total) by mouth every 4 (four) hours as needed for nausea or vomiting.   pantoprazole  (PROTONIX ) 40 MG tablet TAKE 1 TABLET EVERY DAY   Probiotic Product (PROBIOTIC DAILY PO) Take 1 capsule by mouth daily.   SKYRIZI  360 MG/2.4ML SOCT 360 mg SQ injection by cartridge and repeat every 8 weeks   sodium chloride   (OCEAN) 0.65 % SOLN nasal spray Place 1 spray into both nostrils daily as needed for congestion.   sucralfate  (CARAFATE ) 1 g tablet Take 1 tablet (1 g total) by mouth 4 (four) times daily -  with meals and at bedtime.   vancomycin  (VANCOCIN ) 125 MG capsule Take 1 capsule (125 mg total) by mouth 4 (four) times daily.   VASCEPA  1 g capsule Take 2 capsules by mouth 2 times daily.   Vitamin D3 (VITAMIN D ) 25 MCG tablet Take 1,000 Units by mouth at bedtime.   [DISCONTINUED] EPIPEN  2-PAK 0.3 MG/0.3ML SOAJ injection Inject 0.3 mg into the muscle as needed for anaphylaxis. Reported on 06/27/2015   [DISCONTINUED] vancomycin  (VANCOCIN ) 125 MG capsule Take 1 capsule (125 mg total) by mouth 4 (four) times daily for 2 days.   Facility-Administered Encounter Medications as of 07/06/2024  Medication   cyanocobalamin  (VITAMIN B12) injection 1,000 mcg    Allergies as of 07/06/2024 - Review Complete 07/06/2024  Allergen Reaction Noted   Lipitor [atorvastatin ] Other (See Comments) 03/28/2013   Remicade [infliximab]     Rosuvastatin  Other (See Comments) 01/25/2019   Ticagrelor   09/16/2017    Past Medical History:  Diagnosis Date   Allergic rhinitis due to pollen    Allergy     Anxiety state, unspecified    Arthritis    Bronchitis    hx   CAD (coronary artery disease)    s/p NSTEMI in 11/18 treated with a DES to the proximal LAD   Calculus of gallbladder without mention of cholecystitis or obstruction    Crohn disease (HCC)    Depressive disorder, not elsewhere classified    Diabetes mellitus without complication (HCC)    diet controlled   Esophageal reflux    Fibromyalgia    Grade I diastolic dysfunction    Noted on ECHO   Headache    History of nuclear stress test    ETT-Myoview  10/17: EF 55%, normal perfusion; Low Risk // Nuclear stress test 05/2018:  EF 63, normal perfusion; Low Risk   Myalgia and myositis, unspecified    NSTEMI (non-ST elevated myocardial infarction) (HCC) 04/15/2017   Other  and unspecified hyperlipidemia    Palpitations    Pneumonia 13   hx   S/P angioplasty with stent 04/18/17 with DES to pLAD 04/19/2017   Seizures (HCC)    1 seizure as a teenager    Unspecified essential hypertension    Vitamin B deficiency     Past Surgical History:  Procedure Laterality Date   BIOPSY  12/05/2018   Procedure: BIOPSY;  Surgeon: Shila Gustav GAILS, MD;  Location: [facility];  Service: Endoscopy;;   CHOLECYSTECTOMY N/A 09/19/2014   Procedure: LAPAROSCOPIC CHOLECYSTECTOMY WITH INTRAOPERATIVE CHOLANGIOGRAM;  Surgeon: Donnice Lima, MD;  Location: [facility] OR;  Service: General;  Laterality: N/A;   COLONOSCOPY WITH PROPOFOL  N/A 12/05/2018   Procedure: COLONOSCOPY WITH PROPOFOL ;  Surgeon: Shila Gustav GAILS, MD;  Location: [facility];  Service: Endoscopy;  Laterality: N/A;  chromoendoscopy   CORONARY PRESSURE/FFR STUDY N/A 04/18/2017   Procedure: INTRAVASCULAR PRESSURE WIRE/FFR STUDY;  Surgeon: Jordan, Peter M, MD;  Location: [facility] INVASIVE CV LAB;  Service: Cardiovascular;  Laterality: N/A;   CORONARY STENT INTERVENTION N/A 04/18/2017   Procedure: CORONARY STENT INTERVENTION;  Surgeon: Jordan, Peter M, MD;  Location: [facility] INVASIVE CV LAB;  Service: Cardiovascular;  Laterality: N/A;   ESOPHAGOGASTRODUODENOSCOPY     EXPLORATORY LAPAROTOMY     for endometriosis   LEFT HEART CATH AND CORONARY ANGIOGRAPHY N/A 04/18/2017   Procedure: LEFT HEART CATH AND CORONARY ANGIOGRAPHY;  Surgeon: Jordan, Peter M, MD;  Location: [facility] INVASIVE CV LAB;  Service: Cardiovascular;  Laterality: N/A;   POLYPECTOMY  12/05/2018   Procedure: POLYPECTOMY;  Surgeon: Shila Gustav GAILS, MD;  Location: [facility];  Service: Endoscopy;;   SUBMUCOSAL LIFTING INJECTION  12/05/2018   Procedure: SUBMUCOSAL LIFTING INJECTION;  Surgeon: Shila Gustav GAILS, MD;  Location: [facility];  Service: Endoscopy;;   TONSILLECTOMY AND ADENOIDECTOMY     TUBAL LIGATION     BILATERAL    Family History  Problem Relation Age of  Onset   Lung cancer Maternal Grandmother    Stroke Father    Hypertension Mother    Heart attack Mother    Heart disease Mother    Hypertension Brother    Heart disease Brother    Hypertension Sister    Heart disease Sister    Coronary artery disease Brother    Colon cancer Neg Hx  Stomach cancer Neg Hx    Esophageal cancer Neg Hx    Rectal cancer Neg Hx     Social History   Socioeconomic History   Marital status: Married    Spouse name: Dane   Number of children: 2   Years of education: 12+   Highest education level: Not on file  Occupational History   Occupation: ACCOUNT REP.    Employer: [employer]    Comment: retired  Tobacco Use   Smoking status: Former    Current packs/day: 0.00    Average packs/day: 1 pack/day for 35.0 years (35.0 ttl pk-yrs)    Types: Cigarettes    Start date: 05/31/1969    Quit date: 05/31/2004    Years since quitting: 20.1   Smokeless tobacco: Never  Vaping Use   Vaping status: Never Used  Substance and Sexual Activity   Alcohol use: No   Drug use: No   Sexual activity: Yes    Comment: married  Other Topics Concern   Not on file  Social History Narrative   Lives with her husband and their pets. Her children are adults and live independently.   Social Drivers of Health   Tobacco Use: Medium Risk (07/06/2024)   Patient History    Smoking Tobacco Use: Former    Smokeless Tobacco Use: Never    Passive Exposure: Not on file  Financial Resource Strain: Low Risk (03/02/2024)   Overall Financial Resource Strain (CARDIA)    Difficulty of Paying Living Expenses: Not hard at all  Food Insecurity: No Food Insecurity (06/25/2024)   Epic    Worried About Programme Researcher, Broadcasting/film/video in the Last Year: Never true    Ran Out of Food in the Last Year: Never true  Transportation Needs: No Transportation Needs (06/25/2024)   Epic    Lack of Transportation (Medical): No    Lack of Transportation (Non-Medical): No  Physical Activity: Inactive  (03/02/2024)   Exercise Vital Sign    Days of Exercise per Week: 0 days    Minutes of Exercise per Session: 0 min  Stress: No Stress Concern Present (03/02/2024)   [employer] of Occupational Health - Occupational Stress Questionnaire    Feeling of Stress: Only a little  Social Connections: Moderately Isolated (03/02/2024)   Social Connection and Isolation Panel    Frequency of Communication with Friends and Family: More than three times a week    Frequency of Social Gatherings with Friends and Family: Once a week    Attends Religious Services: Never    Database Administrator or Organizations: No    Attends Banker Meetings: Never    Marital Status: Married  Catering Manager Violence: Not At Risk (06/25/2024)   Epic    Fear of Current or Ex-Partner: No    Emotionally Abused: No    Physically Abused: No    Sexually Abused: No  Depression (PHQ2-9): Low Risk (06/25/2024)   Depression (PHQ2-9)    PHQ-2 Score: 0  Alcohol Screen: Low Risk (03/02/2024)   Alcohol Screen    Last Alcohol Screening Score (AUDIT): 0  Housing: Unknown (06/25/2024)   Epic    Unable to Pay for Housing in the Last Year: No    Number of Times Moved in the Last Year: Not on file    Homeless in the Last Year: No  Utilities: Not At Risk (06/25/2024)   Epic    Threatened with loss of utilities: No  Health Literacy: Adequate Health Literacy (  03/02/2024)   B1300 Health Literacy    Frequency of need for help with medical instructions: Never      Review of systems: All other review of systems negative except as mentioned in the HPI.   Physical Exam: Vitals:   07/06/24 1425  BP: 132/72  Pulse: 69   Body mass index is 23.74 kg/m. Gen:      No acute distress HEENT:  sclera anicteric Ext:    No edema Neuro: alert and oriented x 3 Psych: normal mood and affect  Data Reviewed:  Reviewed labs, radiology imaging, old records and pertinent past GI work up   Assessment & Plan Recurrent  Clostridioides difficile infection She continues to experience persistent diarrhea, was prescribed vancomycin  2 days for suspected C. difficile, persistent diarrhea indicating incomplete eradication and elevated risk of recurrence due to recent antibiotic exposure and prior episodes. - Prescribed a 14-day course of vancomycin , four times daily, with instructions to avoid sleep disruption. - Advised her to monitor bowel movements and symptoms. - Directed her to obtain a stool collection kit for C. difficile testing if diarrhea persists after completing vancomycin  - Discussed potential for extended vancomycin  therapy or alternative treatment if infection persists after 14 days. - Scheduled follow-up in one month to assess infection status and symptoms. - Emphasized avoidance of unnecessary antibiotics to reduce recurrence risk.  Crohn's disease Her Crohn's disease is well-controlled on Skyrizi , with infrequent flares and mild intermittent symptoms. Annual surveillance is required to monitor disease activity and medication efficacy. - Initiated Skyrizi  insurance approval/renewal process. - Ordered annual IBD maintenance labs: vitamin B12, vitamin D , iron studies, TB Quantiferon Gold, CBC, CMP, and CRP. - Scheduled follow-up in one month to evaluate disease activity and treatment response.  Vitamin D  deficiency Vitamin D  deficiency is managed as part of her IBD maintenance and requires routine surveillance. - Ordered vitamin D  level as part of annual IBD maintenance labs.  Vitamin B12 deficiency Vitamin B12 deficiency is managed as part of her IBD maintenance and requires routine surveillance. - Ordered vitamin B12 level as part of annual IBD maintenance labs. Return in 4 to 6 weeks   This visit required >30 minutes of patient care (this includes precharting, chart review, review of results, face-to-face time used for counseling as well as treatment plan and follow-up. The patient was provided  an opportunity to ask questions and all were answered. The patient agreed with the plan and demonstrated an understanding of the instructions.  LOIS Wilkie Mcgee , MD    CC: [name] Almarie LABOR, MD    "

## 2024-07-06 NOTE — Telephone Encounter (Signed)
 Pt c/o medication issue:  1. Name of Medication: Evolocumab  (REPATHA  SURECLICK) 140 MG/ML SOAJ   VASCEPA  1 g capsule    2. How are you currently taking this medication (dosage and times per day)? N/A  3. Are you having a reaction (difficulty breathing--STAT)? no  4. What is your medication issue? Pt would like a c/b in regards to pt assistance for Repatha  being that cost is expensive also as well as another medication being prescribed in place of the Vascepa . Please advise

## 2024-07-06 NOTE — Patient Instructions (Addendum)
 VISIT SUMMARY:  [name], a 64 year old female with Crohn's disease, recurrent C. difficile infection, visited for follow-up on persistent diarrhea. She experiences ongoing gastrointestinal symptoms despite current treatment and has a history of urinary and systemic symptoms.  YOUR PLAN:  RECURRENT CLOSTRIDIOIDES DIFFICILE INFECTION: You have persistent diarrhea despite a short course of vancomycin , indicating incomplete eradication and a high risk of recurrence. -Take vancomycin  four times daily for 14 days. Avoid disrupting your sleep for doses. -Monitor your bowel movements and symptoms. -If diarrhea persists after completing vancomycin , obtain a stool collection kit for C. difficile testing and confirm storage requirements with the lab. -Extended vancomycin  therapy or alternative treatment may be considered if infection persists after 14 days. -Follow up in one month to assess infection status and symptoms. -Avoid unnecessary antibiotics to reduce recurrence risk.  CROHN'S DISEASE: Your Crohn's disease is well-controlled on Skyrizi  with infrequent flares and mild intermittent symptoms. -We have started the insurance approval/renewal process for Skyrizi . -Annual IBD maintenance labs have been ordered: vitamin B12, vitamin D , iron studies, TB Quantiferon Gold, CBC, CMP, and CRP. -Follow up in one month to evaluate disease activity and treatment response.  VITAMIN D  DEFICIENCY: Your vitamin D  deficiency is managed as part of your IBD maintenance and requires routine surveillance. -Vitamin D  level has been ordered as part of annual IBD maintenance labs.  VITAMIN B12 DEFICIENCY: Your vitamin B12 deficiency is managed as part of your IBD maintenance and requires routine surveillance. -Vitamin B12 level has been ordered as part of annual IBD maintenance labs.  Your provider has requested that you go to the basement level for lab work before leaving today. Press B on the elevator. The lab  is located at the first door on the left as you exit the elevator.   Due to recent changes in healthcare laws, you may see the results of your imaging and laboratory studies on MyChart before your provider has had a chance to review them.  We understand that in some cases there may be results that are confusing or concerning to you. Not all laboratory results come back in the same time frame and the provider may be waiting for multiple results in order to interpret others.  Please give us  48 hours in order for your provider to thoroughly review all the results before contacting the office for clarification of your results.    I appreciate the  opportunity to care for you  Thank You   Kavitha Nandigam , MD

## 2024-07-09 ENCOUNTER — Inpatient Hospital Stay: Admitting: Internal Medicine

## 2024-07-13 ENCOUNTER — Ambulatory Visit: Admitting: Gastroenterology

## 2024-08-06 ENCOUNTER — Ambulatory Visit: Admitting: Gastroenterology

## 2024-09-12 ENCOUNTER — Ambulatory Visit: Admitting: Cardiology

## 2025-03-05 ENCOUNTER — Ambulatory Visit
